# Patient Record
Sex: Female | Born: 1995 | Race: White | Hispanic: No | Marital: Single | Smoking: Former smoker
Health system: Southern US, Community
[De-identification: ages and names within clinical notes are randomized; demographics above are authoritative.]

## PROBLEM LIST (undated history)

## (undated) DIAGNOSIS — F4310 Post-traumatic stress disorder, unspecified: Secondary | ICD-10-CM

## (undated) DIAGNOSIS — Z8679 Personal history of other diseases of the circulatory system: Secondary | ICD-10-CM

## (undated) DIAGNOSIS — K3184 Gastroparesis: Secondary | ICD-10-CM

## (undated) DIAGNOSIS — K219 Gastro-esophageal reflux disease without esophagitis: Secondary | ICD-10-CM

## (undated) DIAGNOSIS — T7840XA Allergy, unspecified, initial encounter: Secondary | ICD-10-CM

## (undated) DIAGNOSIS — F32A Depression, unspecified: Secondary | ICD-10-CM

## (undated) DIAGNOSIS — M1990 Unspecified osteoarthritis, unspecified site: Secondary | ICD-10-CM

## (undated) DIAGNOSIS — D649 Anemia, unspecified: Secondary | ICD-10-CM

## (undated) DIAGNOSIS — F329 Major depressive disorder, single episode, unspecified: Secondary | ICD-10-CM

## (undated) DIAGNOSIS — F909 Attention-deficit hyperactivity disorder, unspecified type: Secondary | ICD-10-CM

## (undated) DIAGNOSIS — R112 Nausea with vomiting, unspecified: Secondary | ICD-10-CM

## (undated) DIAGNOSIS — Q7962 Hypermobile Ehlers-Danlos syndrome: Secondary | ICD-10-CM

## (undated) DIAGNOSIS — I499 Cardiac arrhythmia, unspecified: Secondary | ICD-10-CM

## (undated) DIAGNOSIS — Q796 Ehlers-Danlos syndrome, unspecified: Secondary | ICD-10-CM

## (undated) DIAGNOSIS — F419 Anxiety disorder, unspecified: Secondary | ICD-10-CM

## (undated) DIAGNOSIS — M797 Fibromyalgia: Secondary | ICD-10-CM

## (undated) DIAGNOSIS — Z9889 Other specified postprocedural states: Secondary | ICD-10-CM

## (undated) DIAGNOSIS — J40 Bronchitis, not specified as acute or chronic: Secondary | ICD-10-CM

## (undated) DIAGNOSIS — J45909 Unspecified asthma, uncomplicated: Secondary | ICD-10-CM

## (undated) DIAGNOSIS — G43909 Migraine, unspecified, not intractable, without status migrainosus: Secondary | ICD-10-CM

## (undated) DIAGNOSIS — L858 Other specified epidermal thickening: Secondary | ICD-10-CM

## (undated) HISTORY — PX: WISDOM TOOTH EXTRACTION: SHX21

## (undated) HISTORY — PX: JOINT REPLACEMENT: SHX530

## (undated) HISTORY — DX: Fibromyalgia: M79.7

## (undated) HISTORY — DX: Other specified epidermal thickening: L85.8

## (undated) HISTORY — PX: ESOPHAGOGASTRODUODENOSCOPY ENDOSCOPY: SHX5814

## (undated) HISTORY — PX: TYMPANOSTOMY TUBE PLACEMENT: SHX32

## (undated) HISTORY — DX: Ehlers-Danlos syndrome, unspecified: Q79.60

## (undated) HISTORY — DX: Migraine, unspecified, not intractable, without status migrainosus: G43.909

## (undated) HISTORY — DX: Allergy, unspecified, initial encounter: T78.40XA

## (undated) HISTORY — PX: SMART PILL PROCEDURE: SHX6496

## (undated) HISTORY — PX: HIP ARTHROPLASTY: SHX981

## (undated) HISTORY — DX: Unspecified osteoarthritis, unspecified site: M19.90

## (undated) HISTORY — DX: Gastroparesis: K31.84

## (undated) HISTORY — PX: HIP SURGERY: SHX245

---
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
## 2016-05-01 DIAGNOSIS — J3081 Allergic rhinitis due to animal (cat) (dog) hair and dander: Secondary | ICD-10-CM

## 2016-05-01 DIAGNOSIS — J312 Chronic pharyngitis: Secondary | ICD-10-CM | POA: Insufficient documentation

## 2016-05-01 DIAGNOSIS — L2381 Allergic contact dermatitis due to animal (cat) (dog) dander: Secondary | ICD-10-CM

## 2016-05-01 HISTORY — DX: Chronic pharyngitis: J31.2

## 2016-05-01 HISTORY — DX: Allergic contact dermatitis due to animal (cat) (dog) dander: L23.81

## 2016-05-01 HISTORY — DX: Allergic rhinitis due to animal (cat) (dog) hair and dander: J30.81

## 2016-10-31 ENCOUNTER — Emergency Department (HOSPITAL_COMMUNITY)
Admission: EM | Admit: 2016-10-31 | Discharge: 2016-10-31 | Disposition: A | Discharge status: Home / Self Care | Payer: 59 | Attending: Emergency Medicine | Admitting: Emergency Medicine

## 2016-10-31 ENCOUNTER — Encounter (HOSPITAL_COMMUNITY): Payer: Self-pay

## 2016-10-31 DIAGNOSIS — F329 Major depressive disorder, single episode, unspecified: Secondary | ICD-10-CM | POA: Diagnosis not present

## 2016-10-31 DIAGNOSIS — Z5181 Encounter for therapeutic drug level monitoring: Secondary | ICD-10-CM | POA: Diagnosis not present

## 2016-10-31 DIAGNOSIS — J45909 Unspecified asthma, uncomplicated: Secondary | ICD-10-CM | POA: Diagnosis not present

## 2016-10-31 DIAGNOSIS — Z87891 Personal history of nicotine dependence: Secondary | ICD-10-CM | POA: Diagnosis not present

## 2016-10-31 DIAGNOSIS — R45851 Suicidal ideations: Secondary | ICD-10-CM | POA: Diagnosis present

## 2016-10-31 HISTORY — DX: Major depressive disorder, single episode, unspecified: F32.9

## 2016-10-31 HISTORY — DX: Bronchitis, not specified as acute or chronic: J40

## 2016-10-31 HISTORY — DX: Depression, unspecified: F32.A

## 2016-10-31 HISTORY — DX: Unspecified asthma, uncomplicated: J45.909

## 2016-10-31 LAB — RAPID URINE DRUG SCREEN, HOSP PERFORMED
AMPHETAMINES: NOT DETECTED
BARBITURATES: NOT DETECTED
Benzodiazepines: NOT DETECTED
Cocaine: NOT DETECTED
OPIATES: NOT DETECTED
TETRAHYDROCANNABINOL: NOT DETECTED

## 2016-10-31 LAB — CBC
HEMATOCRIT: 38.8 % (ref 36.0–46.0)
HEMOGLOBIN: 13.1 g/dL (ref 12.0–15.0)
MCH: 29.3 pg (ref 26.0–34.0)
MCHC: 33.8 g/dL (ref 30.0–36.0)
MCV: 86.8 fL (ref 78.0–100.0)
Platelets: 282 10*3/uL (ref 150–400)
RBC: 4.47 MIL/uL (ref 3.87–5.11)
RDW: 12.2 % (ref 11.5–15.5)
WBC: 8.5 10*3/uL (ref 4.0–10.5)

## 2016-10-31 LAB — COMPREHENSIVE METABOLIC PANEL
ALBUMIN: 4.2 g/dL (ref 3.5–5.0)
ALK PHOS: 53 U/L (ref 38–126)
ALT: 18 U/L (ref 14–54)
AST: 19 U/L (ref 15–41)
Anion gap: 8 (ref 5–15)
BUN: 10 mg/dL (ref 6–20)
CALCIUM: 9.5 mg/dL (ref 8.9–10.3)
CHLORIDE: 106 mmol/L (ref 101–111)
CO2: 22 mmol/L (ref 22–32)
CREATININE: 0.7 mg/dL (ref 0.44–1.00)
GFR calc Af Amer: >60 mL/min (ref >60)
GFR calc non Af Amer: >60 mL/min (ref >60)
GLUCOSE: 87 mg/dL (ref 65–99)
Potassium: 3.7 mmol/L (ref 3.5–5.1)
SODIUM: 136 mmol/L (ref 135–145)
Total Bilirubin: 0.6 mg/dL (ref 0.3–1.2)
Total Protein: 7.3 g/dL (ref 6.5–8.1)

## 2016-10-31 LAB — SALICYLATE LEVEL: Salicylate Lvl: <7 mg/dL (ref 2.8–30.0)

## 2016-10-31 LAB — ETHANOL: Alcohol, Ethyl (B): <5 mg/dL (ref <5)

## 2016-10-31 LAB — ACETAMINOPHEN LEVEL: Acetaminophen (Tylenol), Serum: <10 ug/mL — ABNORMAL LOW (ref 10–30)

## 2016-10-31 LAB — PREGNANCY, URINE: Preg Test, Ur: NEGATIVE

## 2016-10-31 NOTE — ED Triage Notes (Signed)
Onset 2 nights ago pt had thoughts of suicide, had plan, was going to take new medication- Hydroxyzine, pt looked this med up online and found it could have bad interaction with another antihistamine she is on.  Pts friend called [facility], counselor came to see pt today, spent 3 hours with pt, who recommended pt go to ED as [facility] closed at 8pm.  Pt reports thoughts of hurting self today, no plan.  Pt is accompanied by friend.

## 2016-10-31 NOTE — ED Notes (Signed)
Dr. Su Leyeagler in pt room.  Will discharge home.

## 2016-10-31 NOTE — ED Provider Notes (Signed)
[facility]-EMERGENCY DEPT Provider Note   CSN: [id number] Arrival date & time: 10/31/16  2030     History   Chief Complaint Chief Complaint  Patient presents with  . Suicidal    HPI Brianna [name] is a 21 y.o. female.  The history is provided by the patient and a friend. No language interpreter was used.  Mental Health Problem  Presenting symptoms: suicidal thoughts (resolved)   Presenting symptoms: no aggressive behavior, no agitation, no delusions, no depression, no hallucinations, no homicidal ideas, no self-mutilation, no suicidal threats and no suicide attempt   Degree of incapacity (severity):  Moderate Onset quality:  Gradual Duration:  1 week Timing:  Rare Progression:  Resolved Chronicity:  New Context: not alcohol use and not noncompliant   Treatment compliance:  Untreated Relieved by:  Nothing Worsened by:  Nothing Ineffective treatments:  None tried Associated symptoms: no abdominal pain, no chest pain, no fatigue and no headaches   Risk factors: hx of mental illness (deopression)   Risk factors: no hx of suicide attempts     Past Medical History:  Diagnosis Date  . Asthma   . Bronchitis   . Depression     There are no active problems to display for this patient.   Past Surgical History:  Procedure Laterality Date  . WISDOM TOOTH EXTRACTION      OB History    No data available       Home Medications    Prior to Admission medications   Not on File    Family History History reviewed. No pertinent family history.  Social History Social History  Substance Use Topics  . Smoking status: Former Smoker    Types: E-cigarettes  . Smokeless tobacco: Never Used  . Alcohol use 12.0 oz/week    12 Cans of beer, 8 Glasses of wine per week     Allergies   Patient has no known allergies.   Review of Systems Review of Systems  Constitutional: Negative for activity change, chills, diaphoresis, fatigue and fever.  HENT: Negative for  congestion and rhinorrhea.   Eyes: Negative for visual disturbance.  Respiratory: Negative for cough, chest tightness, shortness of breath and stridor.   Cardiovascular: Negative for chest pain, palpitations and leg swelling.  Gastrointestinal: Negative for abdominal distention, abdominal pain, constipation, diarrhea, nausea and vomiting.  Genitourinary: Negative for difficulty urinating, dysuria, flank pain, frequency, hematuria, menstrual problem, pelvic pain, vaginal bleeding and vaginal discharge.  Musculoskeletal: Negative for back pain and neck pain.  Skin: Negative for rash and wound.  Neurological: Negative for dizziness, weakness, light-headedness, numbness and headaches.  Psychiatric/Behavioral: Positive for suicidal ideas (resolved). Negative for agitation, confusion, hallucinations, homicidal ideas and self-injury.  All other systems reviewed and are negative.    Physical Exam Updated Vital Signs BP 117/84 (BP Location: Left Arm)   Pulse 79   Temp 99.5 F (37.5 C) (Oral)   Resp 17   Ht 5\' 3"  (1.6 m)   Wt 63.5 kg (140 lb)   LMP 10/08/2016   SpO2 99%   BMI 24.80 kg/m   Physical Exam  Constitutional: She is oriented to person, place, and time. She appears well-developed and well-nourished. No distress.  HENT:  Head: Normocephalic and atraumatic.  Mouth/Throat: Oropharynx is clear and moist. No oropharyngeal exudate.  Eyes: Conjunctivae and EOM are normal. Pupils are equal, round, and reactive to light.  Neck: Normal range of motion. Neck supple.  Cardiovascular: Normal rate and regular rhythm.   No murmur heard. Pulmonary/Chest:  Effort normal and breath sounds normal. No stridor. No respiratory distress.  Abdominal: Soft. There is no tenderness.  Musculoskeletal: She exhibits no edema or tenderness.  Neurological: She is alert and oriented to person, place, and time. No sensory deficit. She exhibits normal muscle tone.  Skin: Skin is warm and dry. Capillary refill  takes less than 2 seconds. She is not diaphoretic. No erythema.  Psychiatric: She has a normal mood and affect. Her speech is not rapid and/or pressured. She is not agitated, not aggressive and not actively hallucinating. Thought content is not paranoid. She does not express impulsivity. She does not exhibit a depressed mood. She expresses no homicidal and no suicidal ideation. She expresses no suicidal plans and no homicidal plans.  Nursing note and vitals reviewed.    ED Treatments / Results  Labs (all labs ordered are listed, but only abnormal results are displayed) Labs Reviewed  ACETAMINOPHEN LEVEL - Abnormal; Notable for the following:       Result Value   Acetaminophen (Tylenol), Serum <10 (*)    All other components within normal limits  COMPREHENSIVE METABOLIC PANEL  ETHANOL  SALICYLATE LEVEL  CBC  RAPID URINE DRUG SCREEN, HOSP PERFORMED  PREGNANCY, URINE    EKG  EKG Interpretation None       Radiology No results found.  Procedures Procedures (including critical care time)  Medications Ordered in ED Medications - No data to display   Initial Impression / Assessment and Plan / ED Course  I have reviewed the triage vital signs and the nursing notes.  Pertinent labs & imaging results that were available during my care of the patient were reviewed by me and considered in my medical decision making (see chart for details).     Brianna [name] is a 21 y.o. female with a reported past medical history of depression who presents with suicidal ideation. Patient has come in by a friend who reports that several days ago, patient reported suicidal thoughts. Patient had a possible plan of overdose but says that she decided it would be too painful. She says that she was convinced by her therapist to come to the ED for evaluation when she found out that [facility] was closed tonight. Patient currently denies any suicidal ideation or homicidal ideation. She reports no worsened  depression and denies any other complaints. She denies audiovisual hallucinations.  Due to concern for SI, patient was evaluated by me in triage. Next  On exam, no focal neurologic deficits. Lungs clear. Abdomen nontender. Patient denies current suicidal ideation or plan of suicide. Patient reports that she is going to stay with her friend and her friend's parents.  Patient had screening laboratory testing according to mental health protocol. Laboratory testing was unremarkable.  Due to patient's current denial of suicidal ideation, lack of a plan, patient does not appear to be a threat to herself or others. Patient denies history of suicide attempt and reports that she is going to talk to her counselor tomorrow. Patient understood return precautions for any new or worsened symptoms. Patient's friend also voiced understanding of plan of care with close follow-up and strict return precautions. Shared decision-making conversation held and patient felt stable for discharge.  Patient discharged in good condition with understanding of suicide precautions.     Final Clinical Impressions(s) / ED Diagnoses   Final diagnoses:  Suicidal ideation    New Prescriptions There are no discharge medications for this patient.   Clinical Impression: 1. Suicidal ideation  Disposition: Discharge  Condition: Good  I have discussed the results, Dx and Tx plan with the pt(& family if present). He/she/they expressed understanding and agree(s) with the plan. Discharge instructions discussed at great length. Strict return precautions discussed and pt &/or family have verbalized understanding of the instructions. No further questions at time of discharge.    There are no discharge medications for this patient.   Follow Up: [facility] [address] [place] [address] [phone] Schedule an appointment as soon as possible for a visit    [facility] EMERGENCY DEPARTMENT [address] [id number] mc [place] [address] [phone]  If symptoms worsen  [facility] [place] [address] [phone]  Schedule an appointment as soon as possible for a visit       Yanni Ruberg, Canary Brim, MD 11/01/16 [phone]

## 2016-10-31 NOTE — ED Notes (Signed)
This nurse called to triage room #1, pt wanting to leave.  Pt states "I  Think there has been some kind of misunderstanding, I do not have any thoughts of hurting myself tonight".  Discussed with Dr. Su Leyeagler.

## 2016-10-31 NOTE — Discharge Instructions (Signed)
Please call to follow-up with the [facility] team. Please follow-up with your PCP and counselor. If any symptoms change or worsen, please return immediately to the nearest emergency department.

## 2018-05-22 DIAGNOSIS — U071 COVID-19: Secondary | ICD-10-CM

## 2018-05-22 HISTORY — DX: COVID-19: U07.1

## 2018-06-06 DIAGNOSIS — Z79899 Other long term (current) drug therapy: Secondary | ICD-10-CM | POA: Diagnosis not present

## 2018-06-06 DIAGNOSIS — L700 Acne vulgaris: Secondary | ICD-10-CM | POA: Diagnosis not present

## 2018-06-07 DIAGNOSIS — L700 Acne vulgaris: Secondary | ICD-10-CM | POA: Diagnosis not present

## 2018-06-18 DIAGNOSIS — Z79899 Other long term (current) drug therapy: Secondary | ICD-10-CM | POA: Diagnosis not present

## 2018-06-18 DIAGNOSIS — L700 Acne vulgaris: Secondary | ICD-10-CM | POA: Diagnosis not present

## 2018-07-15 DIAGNOSIS — L700 Acne vulgaris: Secondary | ICD-10-CM | POA: Diagnosis not present

## 2018-07-15 DIAGNOSIS — Z79899 Other long term (current) drug therapy: Secondary | ICD-10-CM | POA: Diagnosis not present

## 2018-07-18 DIAGNOSIS — L700 Acne vulgaris: Secondary | ICD-10-CM | POA: Diagnosis not present

## 2018-08-14 DIAGNOSIS — Z79899 Other long term (current) drug therapy: Secondary | ICD-10-CM | POA: Diagnosis not present

## 2018-08-14 DIAGNOSIS — L700 Acne vulgaris: Secondary | ICD-10-CM | POA: Diagnosis not present

## 2018-08-15 DIAGNOSIS — L700 Acne vulgaris: Secondary | ICD-10-CM | POA: Diagnosis not present

## 2018-09-11 DIAGNOSIS — L700 Acne vulgaris: Secondary | ICD-10-CM | POA: Diagnosis not present

## 2018-09-11 DIAGNOSIS — Z79899 Other long term (current) drug therapy: Secondary | ICD-10-CM | POA: Diagnosis not present

## 2018-09-12 DIAGNOSIS — L700 Acne vulgaris: Secondary | ICD-10-CM | POA: Diagnosis not present

## 2018-10-10 DIAGNOSIS — L700 Acne vulgaris: Secondary | ICD-10-CM | POA: Diagnosis not present

## 2018-10-11 DIAGNOSIS — L700 Acne vulgaris: Secondary | ICD-10-CM | POA: Diagnosis not present

## 2018-10-11 DIAGNOSIS — Z79899 Other long term (current) drug therapy: Secondary | ICD-10-CM | POA: Diagnosis not present

## 2018-11-06 DIAGNOSIS — L700 Acne vulgaris: Secondary | ICD-10-CM | POA: Diagnosis not present

## 2018-11-06 DIAGNOSIS — Z79899 Other long term (current) drug therapy: Secondary | ICD-10-CM | POA: Diagnosis not present

## 2018-11-07 DIAGNOSIS — L700 Acne vulgaris: Secondary | ICD-10-CM | POA: Diagnosis not present

## 2018-12-11 DIAGNOSIS — L700 Acne vulgaris: Secondary | ICD-10-CM | POA: Diagnosis not present

## 2018-12-11 DIAGNOSIS — Z79899 Other long term (current) drug therapy: Secondary | ICD-10-CM | POA: Diagnosis not present

## 2018-12-12 DIAGNOSIS — L700 Acne vulgaris: Secondary | ICD-10-CM | POA: Diagnosis not present

## 2019-01-02 DIAGNOSIS — U071 COVID-19: Secondary | ICD-10-CM | POA: Diagnosis not present

## 2019-01-02 DIAGNOSIS — Z03818 Encounter for observation for suspected exposure to other biological agents ruled out: Secondary | ICD-10-CM | POA: Diagnosis not present

## 2019-01-07 DIAGNOSIS — U071 COVID-19: Secondary | ICD-10-CM | POA: Diagnosis not present

## 2019-01-13 DIAGNOSIS — U071 COVID-19: Secondary | ICD-10-CM | POA: Diagnosis not present

## 2019-01-13 DIAGNOSIS — Z03818 Encounter for observation for suspected exposure to other biological agents ruled out: Secondary | ICD-10-CM | POA: Diagnosis not present

## 2019-01-15 DIAGNOSIS — U071 COVID-19: Secondary | ICD-10-CM | POA: Diagnosis not present

## 2019-02-05 DIAGNOSIS — F3342 Major depressive disorder, recurrent, in full remission: Secondary | ICD-10-CM | POA: Diagnosis not present

## 2019-02-13 DIAGNOSIS — Z79899 Other long term (current) drug therapy: Secondary | ICD-10-CM | POA: Diagnosis not present

## 2019-02-13 DIAGNOSIS — L700 Acne vulgaris: Secondary | ICD-10-CM | POA: Diagnosis not present

## 2019-02-17 DIAGNOSIS — Z79899 Other long term (current) drug therapy: Secondary | ICD-10-CM | POA: Diagnosis not present

## 2019-02-17 DIAGNOSIS — L700 Acne vulgaris: Secondary | ICD-10-CM | POA: Diagnosis not present

## 2019-02-26 DIAGNOSIS — Z01419 Encounter for gynecological examination (general) (routine) without abnormal findings: Secondary | ICD-10-CM | POA: Diagnosis not present

## 2019-02-26 DIAGNOSIS — Z113 Encounter for screening for infections with a predominantly sexual mode of transmission: Secondary | ICD-10-CM | POA: Diagnosis not present

## 2019-02-26 DIAGNOSIS — Z6824 Body mass index (BMI) 24.0-24.9, adult: Secondary | ICD-10-CM | POA: Diagnosis not present

## 2019-03-05 DIAGNOSIS — R112 Nausea with vomiting, unspecified: Secondary | ICD-10-CM | POA: Diagnosis not present

## 2019-03-05 DIAGNOSIS — K529 Noninfective gastroenteritis and colitis, unspecified: Secondary | ICD-10-CM | POA: Diagnosis not present

## 2019-03-17 DIAGNOSIS — Z13220 Encounter for screening for lipoid disorders: Secondary | ICD-10-CM | POA: Diagnosis not present

## 2019-03-17 DIAGNOSIS — Z23 Encounter for immunization: Secondary | ICD-10-CM | POA: Diagnosis not present

## 2019-03-17 DIAGNOSIS — Z0000 Encounter for general adult medical examination without abnormal findings: Secondary | ICD-10-CM | POA: Diagnosis not present

## 2019-03-17 DIAGNOSIS — R7989 Other specified abnormal findings of blood chemistry: Secondary | ICD-10-CM | POA: Diagnosis not present

## 2019-04-21 DIAGNOSIS — J029 Acute pharyngitis, unspecified: Secondary | ICD-10-CM | POA: Diagnosis not present

## 2019-04-21 DIAGNOSIS — J069 Acute upper respiratory infection, unspecified: Secondary | ICD-10-CM | POA: Diagnosis not present

## 2019-04-21 DIAGNOSIS — Z03818 Encounter for observation for suspected exposure to other biological agents ruled out: Secondary | ICD-10-CM | POA: Diagnosis not present

## 2019-04-22 DIAGNOSIS — J069 Acute upper respiratory infection, unspecified: Secondary | ICD-10-CM | POA: Diagnosis not present

## 2019-04-22 DIAGNOSIS — J029 Acute pharyngitis, unspecified: Secondary | ICD-10-CM | POA: Diagnosis not present

## 2019-05-08 DIAGNOSIS — Z20818 Contact with and (suspected) exposure to other bacterial communicable diseases: Secondary | ICD-10-CM | POA: Diagnosis not present

## 2019-05-08 DIAGNOSIS — U071 COVID-19: Secondary | ICD-10-CM | POA: Diagnosis not present

## 2019-06-30 DIAGNOSIS — R61 Generalized hyperhidrosis: Secondary | ICD-10-CM | POA: Diagnosis not present

## 2019-06-30 DIAGNOSIS — R197 Diarrhea, unspecified: Secondary | ICD-10-CM | POA: Diagnosis not present

## 2019-07-01 DIAGNOSIS — R197 Diarrhea, unspecified: Secondary | ICD-10-CM | POA: Diagnosis not present

## 2019-07-01 DIAGNOSIS — R61 Generalized hyperhidrosis: Secondary | ICD-10-CM | POA: Diagnosis not present

## 2019-07-24 DIAGNOSIS — Z3040 Encounter for surveillance of contraceptives, unspecified: Secondary | ICD-10-CM | POA: Diagnosis not present

## 2019-07-31 ENCOUNTER — Ambulatory Visit: Payer: 59 | Attending: Internal Medicine

## 2019-07-31 DIAGNOSIS — Z23 Encounter for immunization: Secondary | ICD-10-CM

## 2019-07-31 NOTE — Progress Notes (Signed)
   Covid-19 Vaccination Clinic  Name:  Roxan Yamamoto    MRN: [id number] DOB: [birth year]  07/31/2019  Ms. Imes was observed post Covid-19 immunization for 15 minutes without incident. She was provided with Vaccine Information Sheet and instruction to access the V-Safe system.   Ms. Tonkinson was instructed to call 911 with any severe reactions post vaccine: Marland Kitchen Difficulty breathing  . Swelling of face and throat  . A fast heartbeat  . A bad rash all over body  . Dizziness and weakness   Immunizations Administered    Name Date Dose VIS Date Route   Pfizer COVID-19 Vaccine 07/31/2019 11:10 AM 0.3 mL 05/02/2019 Intramuscular   Manufacturer: [employer], [employer]   Lot: [id number]   NDC: [address]-7

## 2019-08-25 ENCOUNTER — Ambulatory Visit: Payer: 59 | Attending: Internal Medicine

## 2019-08-25 DIAGNOSIS — Z23 Encounter for immunization: Secondary | ICD-10-CM

## 2019-08-25 NOTE — Progress Notes (Signed)
   Covid-19 Vaccination Clinic  Name:  Brianna [name]    MRN: [id number] DOB: [birth year]  08/25/2019  Brianna [name] was observed post Covid-19 immunization for 15 minutes without incident. She was provided with Vaccine Information Sheet and instruction to access the V-Safe system.   Brianna [name] was instructed to call 911 with any severe reactions post vaccine: Marland Kitchen Difficulty breathing  . Swelling of face and throat  . A fast heartbeat  . A bad rash all over body  . Dizziness and weakness   Immunizations Administered    Name Date Dose VIS Date Route   Pfizer COVID-19 Vaccine 08/25/2019 10:14 AM 0.3 mL 05/02/2019 Intramuscular   Manufacturer: [employer], [employer]   Lot: [id number]   NDC: [address]-1

## 2019-10-03 DIAGNOSIS — R05 Cough: Secondary | ICD-10-CM | POA: Diagnosis not present

## 2019-10-03 DIAGNOSIS — J029 Acute pharyngitis, unspecified: Secondary | ICD-10-CM | POA: Diagnosis not present

## 2019-10-03 DIAGNOSIS — R519 Headache, unspecified: Secondary | ICD-10-CM | POA: Diagnosis not present

## 2019-10-03 DIAGNOSIS — Z03818 Encounter for observation for suspected exposure to other biological agents ruled out: Secondary | ICD-10-CM | POA: Diagnosis not present

## 2019-10-09 DIAGNOSIS — J00 Acute nasopharyngitis [common cold]: Secondary | ICD-10-CM | POA: Diagnosis not present

## 2019-10-14 DIAGNOSIS — H66002 Acute suppurative otitis media without spontaneous rupture of ear drum, left ear: Secondary | ICD-10-CM | POA: Diagnosis not present

## 2019-10-15 DIAGNOSIS — Z3009 Encounter for other general counseling and advice on contraception: Secondary | ICD-10-CM | POA: Diagnosis not present

## 2019-10-15 DIAGNOSIS — Z6824 Body mass index (BMI) 24.0-24.9, adult: Secondary | ICD-10-CM | POA: Diagnosis not present

## 2019-10-16 DIAGNOSIS — H6506 Acute serous otitis media, recurrent, bilateral: Secondary | ICD-10-CM | POA: Diagnosis not present

## 2019-10-16 DIAGNOSIS — H9202 Otalgia, left ear: Secondary | ICD-10-CM | POA: Diagnosis not present

## 2019-10-21 DIAGNOSIS — J302 Other seasonal allergic rhinitis: Secondary | ICD-10-CM | POA: Diagnosis not present

## 2019-10-21 DIAGNOSIS — J0141 Acute recurrent pansinusitis: Secondary | ICD-10-CM | POA: Insufficient documentation

## 2019-10-21 DIAGNOSIS — H6983 Other specified disorders of Eustachian tube, bilateral: Secondary | ICD-10-CM | POA: Insufficient documentation

## 2019-10-21 HISTORY — DX: Acute recurrent pansinusitis: J01.41

## 2019-11-20 DIAGNOSIS — S99912A Unspecified injury of left ankle, initial encounter: Secondary | ICD-10-CM | POA: Diagnosis not present

## 2019-11-20 DIAGNOSIS — S93402A Sprain of unspecified ligament of left ankle, initial encounter: Secondary | ICD-10-CM | POA: Diagnosis not present

## 2020-01-02 DIAGNOSIS — F4323 Adjustment disorder with mixed anxiety and depressed mood: Secondary | ICD-10-CM | POA: Diagnosis not present

## 2020-01-05 DIAGNOSIS — F4323 Adjustment disorder with mixed anxiety and depressed mood: Secondary | ICD-10-CM | POA: Diagnosis not present

## 2020-01-07 DIAGNOSIS — K219 Gastro-esophageal reflux disease without esophagitis: Secondary | ICD-10-CM | POA: Diagnosis not present

## 2020-01-12 DIAGNOSIS — F4323 Adjustment disorder with mixed anxiety and depressed mood: Secondary | ICD-10-CM | POA: Diagnosis not present

## 2020-01-13 DIAGNOSIS — F411 Generalized anxiety disorder: Secondary | ICD-10-CM | POA: Diagnosis not present

## 2020-01-20 DIAGNOSIS — F411 Generalized anxiety disorder: Secondary | ICD-10-CM | POA: Diagnosis not present

## 2020-01-27 DIAGNOSIS — F411 Generalized anxiety disorder: Secondary | ICD-10-CM | POA: Diagnosis not present

## 2020-02-03 DIAGNOSIS — F411 Generalized anxiety disorder: Secondary | ICD-10-CM | POA: Diagnosis not present

## 2020-02-10 DIAGNOSIS — F411 Generalized anxiety disorder: Secondary | ICD-10-CM | POA: Diagnosis not present

## 2020-02-12 DIAGNOSIS — R195 Other fecal abnormalities: Secondary | ICD-10-CM | POA: Diagnosis not present

## 2020-02-12 DIAGNOSIS — R110 Nausea: Secondary | ICD-10-CM | POA: Diagnosis not present

## 2020-02-12 DIAGNOSIS — R1084 Generalized abdominal pain: Secondary | ICD-10-CM | POA: Diagnosis not present

## 2020-02-12 DIAGNOSIS — R140 Abdominal distension (gaseous): Secondary | ICD-10-CM | POA: Diagnosis not present

## 2020-02-12 DIAGNOSIS — K921 Melena: Secondary | ICD-10-CM | POA: Diagnosis not present

## 2020-02-17 DIAGNOSIS — F411 Generalized anxiety disorder: Secondary | ICD-10-CM | POA: Diagnosis not present

## 2020-02-20 DIAGNOSIS — N819 Female genital prolapse, unspecified: Secondary | ICD-10-CM | POA: Insufficient documentation

## 2020-02-24 DIAGNOSIS — F411 Generalized anxiety disorder: Secondary | ICD-10-CM | POA: Diagnosis not present

## 2020-03-03 DIAGNOSIS — K921 Melena: Secondary | ICD-10-CM | POA: Diagnosis not present

## 2020-03-03 DIAGNOSIS — K3184 Gastroparesis: Secondary | ICD-10-CM | POA: Insufficient documentation

## 2020-03-09 DIAGNOSIS — F411 Generalized anxiety disorder: Secondary | ICD-10-CM | POA: Diagnosis not present

## 2020-03-16 DIAGNOSIS — F411 Generalized anxiety disorder: Secondary | ICD-10-CM | POA: Diagnosis not present

## 2020-03-19 DIAGNOSIS — R112 Nausea with vomiting, unspecified: Secondary | ICD-10-CM | POA: Diagnosis not present

## 2020-03-19 DIAGNOSIS — R197 Diarrhea, unspecified: Secondary | ICD-10-CM | POA: Diagnosis not present

## 2020-03-23 DIAGNOSIS — F411 Generalized anxiety disorder: Secondary | ICD-10-CM | POA: Diagnosis not present

## 2020-03-25 DIAGNOSIS — R197 Diarrhea, unspecified: Secondary | ICD-10-CM | POA: Diagnosis not present

## 2020-03-26 ENCOUNTER — Other Ambulatory Visit: Payer: Self-pay | Admitting: Gastroenterology

## 2020-03-26 DIAGNOSIS — R1084 Generalized abdominal pain: Secondary | ICD-10-CM

## 2020-03-30 DIAGNOSIS — F411 Generalized anxiety disorder: Secondary | ICD-10-CM | POA: Diagnosis not present

## 2020-03-30 DIAGNOSIS — F410 Panic disorder [episodic paroxysmal anxiety] without agoraphobia: Secondary | ICD-10-CM | POA: Diagnosis not present

## 2020-03-30 DIAGNOSIS — F900 Attention-deficit hyperactivity disorder, predominantly inattentive type: Secondary | ICD-10-CM | POA: Diagnosis not present

## 2020-03-30 DIAGNOSIS — F331 Major depressive disorder, recurrent, moderate: Secondary | ICD-10-CM | POA: Diagnosis not present

## 2020-04-01 DIAGNOSIS — L700 Acne vulgaris: Secondary | ICD-10-CM | POA: Diagnosis not present

## 2020-04-01 DIAGNOSIS — Z1159 Encounter for screening for other viral diseases: Secondary | ICD-10-CM | POA: Diagnosis not present

## 2020-04-05 DIAGNOSIS — Z01419 Encounter for gynecological examination (general) (routine) without abnormal findings: Secondary | ICD-10-CM | POA: Diagnosis not present

## 2020-04-05 DIAGNOSIS — K644 Residual hemorrhoidal skin tags: Secondary | ICD-10-CM | POA: Diagnosis not present

## 2020-04-05 DIAGNOSIS — Z6820 Body mass index (BMI) 20.0-20.9, adult: Secondary | ICD-10-CM | POA: Diagnosis not present

## 2020-04-05 DIAGNOSIS — R194 Change in bowel habit: Secondary | ICD-10-CM | POA: Diagnosis not present

## 2020-04-05 DIAGNOSIS — K6289 Other specified diseases of anus and rectum: Secondary | ICD-10-CM | POA: Diagnosis not present

## 2020-04-05 DIAGNOSIS — Z113 Encounter for screening for infections with a predominantly sexual mode of transmission: Secondary | ICD-10-CM | POA: Diagnosis not present

## 2020-04-05 DIAGNOSIS — F411 Generalized anxiety disorder: Secondary | ICD-10-CM | POA: Diagnosis not present

## 2020-04-05 DIAGNOSIS — K625 Hemorrhage of anus and rectum: Secondary | ICD-10-CM | POA: Diagnosis not present

## 2020-04-06 DIAGNOSIS — R1013 Epigastric pain: Secondary | ICD-10-CM | POA: Diagnosis not present

## 2020-04-06 DIAGNOSIS — R1010 Upper abdominal pain, unspecified: Secondary | ICD-10-CM | POA: Diagnosis not present

## 2020-04-06 DIAGNOSIS — K2930 Chronic superficial gastritis without bleeding: Secondary | ICD-10-CM | POA: Diagnosis not present

## 2020-04-06 DIAGNOSIS — Z113 Encounter for screening for infections with a predominantly sexual mode of transmission: Secondary | ICD-10-CM | POA: Diagnosis not present

## 2020-04-06 DIAGNOSIS — K3189 Other diseases of stomach and duodenum: Secondary | ICD-10-CM | POA: Diagnosis not present

## 2020-04-06 DIAGNOSIS — R140 Abdominal distension (gaseous): Secondary | ICD-10-CM | POA: Diagnosis not present

## 2020-04-12 DIAGNOSIS — R1084 Generalized abdominal pain: Secondary | ICD-10-CM | POA: Diagnosis not present

## 2020-04-12 DIAGNOSIS — F411 Generalized anxiety disorder: Secondary | ICD-10-CM | POA: Diagnosis not present

## 2020-04-13 ENCOUNTER — Ambulatory Visit
Admission: RE | Admit: 2020-04-13 | Discharge: 2020-04-13 | Disposition: A | Discharge status: Home / Self Care | Payer: 59 | Source: Ambulatory Visit | Attending: Gastroenterology | Admitting: Gastroenterology

## 2020-04-13 DIAGNOSIS — R1084 Generalized abdominal pain: Secondary | ICD-10-CM

## 2020-04-14 ENCOUNTER — Other Ambulatory Visit: Payer: Self-pay

## 2020-04-14 ENCOUNTER — Other Ambulatory Visit: Payer: Self-pay | Admitting: Gastroenterology

## 2020-04-14 ENCOUNTER — Ambulatory Visit
Admission: RE | Admit: 2020-04-14 | Discharge: 2020-04-14 | Disposition: A | Discharge status: Home / Self Care | Payer: BC Managed Care – PPO | Source: Ambulatory Visit | Attending: Gastroenterology | Admitting: Gastroenterology

## 2020-04-14 DIAGNOSIS — F331 Major depressive disorder, recurrent, moderate: Secondary | ICD-10-CM | POA: Diagnosis not present

## 2020-04-14 DIAGNOSIS — F900 Attention-deficit hyperactivity disorder, predominantly inattentive type: Secondary | ICD-10-CM | POA: Diagnosis not present

## 2020-04-14 DIAGNOSIS — R1084 Generalized abdominal pain: Secondary | ICD-10-CM

## 2020-04-14 DIAGNOSIS — F411 Generalized anxiety disorder: Secondary | ICD-10-CM | POA: Diagnosis not present

## 2020-04-14 DIAGNOSIS — F410 Panic disorder [episodic paroxysmal anxiety] without agoraphobia: Secondary | ICD-10-CM | POA: Diagnosis not present

## 2020-04-19 DIAGNOSIS — F411 Generalized anxiety disorder: Secondary | ICD-10-CM | POA: Diagnosis not present

## 2020-04-20 DIAGNOSIS — F900 Attention-deficit hyperactivity disorder, predominantly inattentive type: Secondary | ICD-10-CM | POA: Diagnosis not present

## 2020-04-20 DIAGNOSIS — F331 Major depressive disorder, recurrent, moderate: Secondary | ICD-10-CM | POA: Diagnosis not present

## 2020-04-20 DIAGNOSIS — F411 Generalized anxiety disorder: Secondary | ICD-10-CM | POA: Diagnosis not present

## 2020-04-20 DIAGNOSIS — F410 Panic disorder [episodic paroxysmal anxiety] without agoraphobia: Secondary | ICD-10-CM | POA: Diagnosis not present

## 2020-04-20 DIAGNOSIS — R1084 Generalized abdominal pain: Secondary | ICD-10-CM | POA: Diagnosis not present

## 2020-04-20 DIAGNOSIS — R634 Abnormal weight loss: Secondary | ICD-10-CM | POA: Diagnosis not present

## 2020-04-20 DIAGNOSIS — K623 Rectal prolapse: Secondary | ICD-10-CM | POA: Diagnosis not present

## 2020-04-20 DIAGNOSIS — R140 Abdominal distension (gaseous): Secondary | ICD-10-CM | POA: Diagnosis not present

## 2020-04-21 DIAGNOSIS — R634 Abnormal weight loss: Secondary | ICD-10-CM | POA: Diagnosis not present

## 2020-04-26 DIAGNOSIS — K921 Melena: Secondary | ICD-10-CM | POA: Diagnosis not present

## 2020-04-26 DIAGNOSIS — K644 Residual hemorrhoidal skin tags: Secondary | ICD-10-CM | POA: Diagnosis not present

## 2020-04-26 DIAGNOSIS — K6289 Other specified diseases of anus and rectum: Secondary | ICD-10-CM | POA: Diagnosis not present

## 2020-04-28 DIAGNOSIS — K6389 Other specified diseases of intestine: Secondary | ICD-10-CM | POA: Diagnosis not present

## 2020-04-28 DIAGNOSIS — R140 Abdominal distension (gaseous): Secondary | ICD-10-CM | POA: Diagnosis not present

## 2020-04-28 DIAGNOSIS — R634 Abnormal weight loss: Secondary | ICD-10-CM | POA: Diagnosis not present

## 2020-04-28 DIAGNOSIS — K5900 Constipation, unspecified: Secondary | ICD-10-CM | POA: Diagnosis not present

## 2020-04-28 DIAGNOSIS — R1084 Generalized abdominal pain: Secondary | ICD-10-CM | POA: Diagnosis not present

## 2020-04-29 DIAGNOSIS — K644 Residual hemorrhoidal skin tags: Secondary | ICD-10-CM | POA: Diagnosis not present

## 2020-04-29 DIAGNOSIS — K648 Other hemorrhoids: Secondary | ICD-10-CM | POA: Insufficient documentation

## 2020-04-29 DIAGNOSIS — M6289 Other specified disorders of muscle: Secondary | ICD-10-CM | POA: Diagnosis not present

## 2020-05-04 DIAGNOSIS — R1030 Lower abdominal pain, unspecified: Secondary | ICD-10-CM | POA: Diagnosis not present

## 2020-05-04 DIAGNOSIS — E611 Iron deficiency: Secondary | ICD-10-CM | POA: Diagnosis not present

## 2020-05-04 DIAGNOSIS — F419 Anxiety disorder, unspecified: Secondary | ICD-10-CM | POA: Diagnosis not present

## 2020-05-12 DIAGNOSIS — R0981 Nasal congestion: Secondary | ICD-10-CM | POA: Diagnosis not present

## 2020-05-12 DIAGNOSIS — R52 Pain, unspecified: Secondary | ICD-10-CM | POA: Diagnosis not present

## 2020-05-12 DIAGNOSIS — R519 Headache, unspecified: Secondary | ICD-10-CM | POA: Diagnosis not present

## 2020-05-12 DIAGNOSIS — Z20822 Contact with and (suspected) exposure to covid-19: Secondary | ICD-10-CM | POA: Diagnosis not present

## 2020-05-12 DIAGNOSIS — R059 Cough, unspecified: Secondary | ICD-10-CM | POA: Diagnosis not present

## 2020-05-12 DIAGNOSIS — J029 Acute pharyngitis, unspecified: Secondary | ICD-10-CM | POA: Diagnosis not present

## 2020-05-17 DIAGNOSIS — J069 Acute upper respiratory infection, unspecified: Secondary | ICD-10-CM | POA: Diagnosis not present

## 2020-05-17 DIAGNOSIS — M6289 Other specified disorders of muscle: Secondary | ICD-10-CM | POA: Diagnosis not present

## 2020-05-17 DIAGNOSIS — Z20822 Contact with and (suspected) exposure to covid-19: Secondary | ICD-10-CM | POA: Diagnosis not present

## 2020-05-17 DIAGNOSIS — K5909 Other constipation: Secondary | ICD-10-CM | POA: Diagnosis not present

## 2020-05-17 DIAGNOSIS — R102 Pelvic and perineal pain: Secondary | ICD-10-CM | POA: Diagnosis not present

## 2020-05-18 DIAGNOSIS — F411 Generalized anxiety disorder: Secondary | ICD-10-CM | POA: Diagnosis not present

## 2020-05-18 DIAGNOSIS — F331 Major depressive disorder, recurrent, moderate: Secondary | ICD-10-CM | POA: Diagnosis not present

## 2020-05-18 DIAGNOSIS — F900 Attention-deficit hyperactivity disorder, predominantly inattentive type: Secondary | ICD-10-CM | POA: Diagnosis not present

## 2020-05-18 DIAGNOSIS — F410 Panic disorder [episodic paroxysmal anxiety] without agoraphobia: Secondary | ICD-10-CM | POA: Diagnosis not present

## 2020-05-27 DIAGNOSIS — N764 Abscess of vulva: Secondary | ICD-10-CM | POA: Diagnosis not present

## 2020-05-28 DIAGNOSIS — K921 Melena: Secondary | ICD-10-CM | POA: Diagnosis not present

## 2020-05-28 DIAGNOSIS — K644 Residual hemorrhoidal skin tags: Secondary | ICD-10-CM | POA: Diagnosis not present

## 2020-05-31 DIAGNOSIS — R7989 Other specified abnormal findings of blood chemistry: Secondary | ICD-10-CM | POA: Diagnosis not present

## 2020-05-31 DIAGNOSIS — R102 Pelvic and perineal pain: Secondary | ICD-10-CM | POA: Diagnosis not present

## 2020-05-31 DIAGNOSIS — M6289 Other specified disorders of muscle: Secondary | ICD-10-CM | POA: Diagnosis not present

## 2020-05-31 DIAGNOSIS — K5909 Other constipation: Secondary | ICD-10-CM | POA: Diagnosis not present

## 2020-06-14 DIAGNOSIS — R002 Palpitations: Secondary | ICD-10-CM | POA: Insufficient documentation

## 2020-06-14 DIAGNOSIS — N926 Irregular menstruation, unspecified: Secondary | ICD-10-CM | POA: Diagnosis not present

## 2020-06-14 DIAGNOSIS — R109 Unspecified abdominal pain: Secondary | ICD-10-CM | POA: Diagnosis not present

## 2020-06-14 DIAGNOSIS — R102 Pelvic and perineal pain: Secondary | ICD-10-CM | POA: Diagnosis not present

## 2020-06-14 DIAGNOSIS — R195 Other fecal abnormalities: Secondary | ICD-10-CM | POA: Diagnosis not present

## 2020-06-15 DIAGNOSIS — F900 Attention-deficit hyperactivity disorder, predominantly inattentive type: Secondary | ICD-10-CM | POA: Diagnosis not present

## 2020-06-15 DIAGNOSIS — F411 Generalized anxiety disorder: Secondary | ICD-10-CM | POA: Diagnosis not present

## 2020-06-15 DIAGNOSIS — F410 Panic disorder [episodic paroxysmal anxiety] without agoraphobia: Secondary | ICD-10-CM | POA: Diagnosis not present

## 2020-06-15 DIAGNOSIS — F331 Major depressive disorder, recurrent, moderate: Secondary | ICD-10-CM | POA: Diagnosis not present

## 2020-06-21 ENCOUNTER — Other Ambulatory Visit: Payer: Self-pay | Admitting: Obstetrics

## 2020-06-21 DIAGNOSIS — F411 Generalized anxiety disorder: Secondary | ICD-10-CM | POA: Diagnosis not present

## 2020-06-21 DIAGNOSIS — R102 Pelvic and perineal pain: Secondary | ICD-10-CM

## 2020-06-22 DIAGNOSIS — R0789 Other chest pain: Secondary | ICD-10-CM | POA: Insufficient documentation

## 2020-06-22 DIAGNOSIS — K5903 Drug induced constipation: Secondary | ICD-10-CM | POA: Diagnosis not present

## 2020-06-22 DIAGNOSIS — R1900 Intra-abdominal and pelvic swelling, mass and lump, unspecified site: Secondary | ICD-10-CM | POA: Diagnosis not present

## 2020-06-22 DIAGNOSIS — R0602 Shortness of breath: Secondary | ICD-10-CM | POA: Insufficient documentation

## 2020-06-22 DIAGNOSIS — R42 Dizziness and giddiness: Secondary | ICD-10-CM | POA: Insufficient documentation

## 2020-06-22 DIAGNOSIS — R002 Palpitations: Secondary | ICD-10-CM | POA: Diagnosis not present

## 2020-06-23 ENCOUNTER — Ambulatory Visit
Admission: RE | Admit: 2020-06-23 | Discharge: 2020-06-23 | Disposition: A | Discharge status: Home / Self Care | Payer: BC Managed Care – PPO | Source: Ambulatory Visit | Attending: Obstetrics | Admitting: Obstetrics

## 2020-06-23 DIAGNOSIS — R002 Palpitations: Secondary | ICD-10-CM | POA: Diagnosis not present

## 2020-06-23 DIAGNOSIS — R102 Pelvic and perineal pain: Secondary | ICD-10-CM

## 2020-06-23 DIAGNOSIS — R42 Dizziness and giddiness: Secondary | ICD-10-CM | POA: Diagnosis not present

## 2020-06-23 DIAGNOSIS — R0602 Shortness of breath: Secondary | ICD-10-CM | POA: Diagnosis not present

## 2020-06-23 DIAGNOSIS — R0789 Other chest pain: Secondary | ICD-10-CM | POA: Diagnosis not present

## 2020-06-25 DIAGNOSIS — K582 Mixed irritable bowel syndrome: Secondary | ICD-10-CM | POA: Diagnosis not present

## 2020-06-25 DIAGNOSIS — R1030 Lower abdominal pain, unspecified: Secondary | ICD-10-CM | POA: Diagnosis not present

## 2020-06-25 DIAGNOSIS — R634 Abnormal weight loss: Secondary | ICD-10-CM | POA: Diagnosis not present

## 2020-06-25 DIAGNOSIS — R197 Diarrhea, unspecified: Secondary | ICD-10-CM | POA: Diagnosis not present

## 2020-06-28 ENCOUNTER — Ambulatory Visit (INDEPENDENT_AMBULATORY_CARE_PROVIDER_SITE_OTHER): Payer: BC Managed Care – PPO | Admitting: Nurse Practitioner

## 2020-06-28 VITALS — BP 111/69 | HR 89 | Temp 97.5°F | Ht 66.0 in | Wt 117.0 lb

## 2020-06-28 DIAGNOSIS — F332 Major depressive disorder, recurrent severe without psychotic features: Secondary | ICD-10-CM | POA: Insufficient documentation

## 2020-06-28 DIAGNOSIS — F3342 Major depressive disorder, recurrent, in full remission: Secondary | ICD-10-CM | POA: Insufficient documentation

## 2020-06-28 DIAGNOSIS — Z8616 Personal history of COVID-19: Secondary | ICD-10-CM | POA: Diagnosis not present

## 2020-06-28 DIAGNOSIS — R002 Palpitations: Secondary | ICD-10-CM | POA: Insufficient documentation

## 2020-06-28 DIAGNOSIS — U099 Post covid-19 condition, unspecified: Secondary | ICD-10-CM | POA: Insufficient documentation

## 2020-06-28 DIAGNOSIS — R000 Tachycardia, unspecified: Secondary | ICD-10-CM | POA: Diagnosis not present

## 2020-06-28 DIAGNOSIS — M2550 Pain in unspecified joint: Secondary | ICD-10-CM | POA: Diagnosis not present

## 2020-06-28 DIAGNOSIS — R41840 Attention and concentration deficit: Secondary | ICD-10-CM

## 2020-06-28 DIAGNOSIS — K219 Gastro-esophageal reflux disease without esophagitis: Secondary | ICD-10-CM | POA: Insufficient documentation

## 2020-06-28 NOTE — Patient Instructions (Addendum)
History of COVID - 2020 Joint pain:  Will check labs for inflammatory markers  Will place referral to PMR   Brain fog:  Will place referral to speech therapy for cognitive rehabilitation  GI issues:  Please keep follow up appointments with GI  Palpitations Chest pain:  Please keep follow up appointments with Cardiology  Will place referral to EP - evaluate for POTS  Depression:  Please keep follow up appointment with psychiatry    Follow up:  Follow up in 3 months or sooner if needed

## 2020-06-28 NOTE — Progress Notes (Signed)
@Patient  ID: , female    DOB: Mar 22, 1996, 25 y.o.   MRN: [id number]  Chief Complaint  Patient presents with  . New Patient (Initial Visit)    +2020, Sx: SOB, brain fog, chest pain,tightness (already in with Cardiology), sleep issues, night sweats, body pain. Stomach issues (in with GI) , dizzy spells.     Referring provider: [id number]., MD  25 year old female with history of allergic rhinitis, GERD, depression, anxiety  HPI  Patient presents today for post COVID care clinic visit.  Patient was diagnosed with Covid in August 2020.  She states that since that time she has been experiencing shortness of breath, brain fog, chest pain and tightness, sleep disturbance, night sweats, body pain, GI issues, dizzy spells.  Patient is currently seeing cardiology and GI.  Recent CTA showed that her lungs were clear.  We discussed that considering ongoing symptoms we can place referrals for speech therapy to help with cognitive rehabilitation, EP specialist to evaluate for POTS.  Patient also complains of multiple joint pain.  We will do lab work including inflammatory markers and refer patient to PMR.  Patient did have recent CRP that was normal.  She also had recent thyroid work-up that was normal. Denies f/c/s, n/v/d, hemoptysis, PND, chest pain or edema.     Allergies  Allergen Reactions  . Clindamycin Hives and Rash    Other reaction(s): swelling    Immunization History  Administered Date(s) Administered  . HPV Quadrivalent 09/04/2012  . Influenza Split 02/20/2020  . Influenza,inj,Quad PF,6+ Mos 05/02/2015, 03/17/2019, 03/07/2020  . PFIZER(Purple Top)SARS-COV-2 Vaccination 07/31/2019, 08/25/2019, 02/20/2020, 03/07/2020  . Tdap 12/10/2014, 03/17/2019    Past Medical History:  Diagnosis Date  . Asthma   . Bronchitis   . Depression     Tobacco History: Social History   Tobacco Use  Smoking Status Former Smoker  . Types: E-cigarettes  Smokeless Tobacco  Never Used   Counseling given: Not Answered   Outpatient Encounter Medications as of 06/28/2020  Medication Sig  . clonazePAM (KLONOPIN) 0.5 MG tablet Take by mouth.  . dicyclomine (BENTYL) 10 MG capsule Take by mouth.  . Ferrous Sulfate Dried (EQ SLOW-RELEASE IRON) 45 MG TBCR Take 1 tablet by mouth every other day.  . fluticasone (FLONASE) 50 MCG/ACT nasal spray Flonase 50 mcg/actuation nasal spray,suspension  Spray 1 spray every day by intranasal route.  . loratadine (CLARITIN) 10 MG tablet loratadine 10 mg tablet  . Multiple Vitamin (MULTIVITAMIN) tablet Take 1 tablet by mouth daily.   No facility-administered encounter medications on file as of 06/28/2020.     Review of Systems  Review of Systems  Constitutional: Negative.  Negative for fatigue and fever.  HENT: Negative.   Respiratory: Positive for shortness of breath. Negative for cough.   Cardiovascular: Positive for palpitations.  Gastrointestinal: Negative.   Musculoskeletal: Positive for arthralgias and myalgias.  Allergic/Immunologic: Negative.   Neurological: Positive for dizziness.  Psychiatric/Behavioral: Positive for decreased concentration and sleep disturbance.       Physical Exam  BP 111/69 (BP Location: Left Arm)   Pulse 89   Temp (!) 97.5 F (36.4 C)   Ht 5\' 6"  (1.676 m)   Wt 117 lb (53.1 kg)   LMP 06/25/2020   SpO2 100%   BMI 18.88 kg/m   Wt Readings from Last 5 Encounters:  06/28/20 117 lb (53.1 kg)  10/31/16 140 lb (63.5 kg) (69 %, Z= 0.50)*   * Growth percentiles are based on CDC (Girls, 2-20  Years) data.     Physical Exam Vitals and nursing note reviewed.  Constitutional:      General: She is not in acute distress.    Appearance: She is well-developed and well-nourished.  Cardiovascular:     Rate and Rhythm: Normal rate and regular rhythm.  Pulmonary:     Effort: Pulmonary effort is normal.     Breath sounds: Normal breath sounds.  Musculoskeletal:     Right lower leg: No edema.      Left lower leg: No edema.  Neurological:     Mental Status: She is alert and oriented to person, place, and time.  Psychiatric:        Mood and Affect: Mood and affect and mood normal.        Behavior: Behavior normal.       Imaging: US PELVIC COMPLETE WITH TRANSVAGINAL  Result Date: 06/23/2020 CLINICAL DATA:  Pelvic pain EXAM: TRANSABDOMINAL AND TRANSVAGINAL ULTRASOUND OF PELVIS TECHNIQUE: Both transabdominal and transvaginal ultrasound examinations of the pelvis were performed. Transabdominal technique was performed for global imaging of the pelvis including uterus, ovaries, adnexal regions, and pelvic cul-de-sac. It was necessary to proceed with endovaginal exam following the transabdominal exam to visualize the ovaries. COMPARISON:  CT dated April 14, 2020 FINDINGS: Uterus Measurements: 8.6 x 3.5 x 4.7 cm = volume: 74 mL. No fibroids or other mass visualized. Endometrium Thickness: 8 mm.  No focal abnormality visualized. Right ovary Measurements: 3.9 x 2.6 x 2.5 cm = volume: 13 mL. Normal appearance/no adnexal mass. Left ovary Measurements: 3.9 x 1.8 x 1.8 cm = volume: 6.5 mL. Normal appearance/no adnexal mass. Other findings No abnormal free fluid. IMPRESSION: Unremarkable pelvic ultrasound. Electronically Signed   By: Katherine Mantle M.D.   On: 06/23/2020 14:59     Assessment & Plan:   History of COVID-19 Joint pain:  Will check labs for inflammatory markers  Will place referral to PMR   Brain fog:  Will place referral to speech therapy for cognitive rehabilitation  GI issues:  Please keep follow up appointments with GI  Palpitations Chest pain:  Please keep follow up appointments with Cardiology  Will place referral to EP - evaluate for POTS  Depression:  Please keep follow up appointment with psychiatry    Follow up:  Follow up in 3 months or sooner if needed      Ivonne Andrew, NP 06/28/2020

## 2020-06-28 NOTE — Assessment & Plan Note (Signed)
Joint pain:  Will check labs for inflammatory markers  Will place referral to PMR   Brain fog:  Will place referral to speech therapy for cognitive rehabilitation  GI issues:  Please keep follow up appointments with GI  Palpitations Chest pain:  Please keep follow up appointments with Cardiology  Will place referral to EP - evaluate for POTS  Depression:  Please keep follow up appointment with psychiatry    Follow up:  Follow up in 3 months or sooner if needed

## 2020-06-29 DIAGNOSIS — F411 Generalized anxiety disorder: Secondary | ICD-10-CM | POA: Diagnosis not present

## 2020-06-29 LAB — SEDIMENTATION RATE: Sed Rate: 3 mm/hr (ref 0–32)

## 2020-06-29 LAB — RHEUMATOID FACTOR: Rhuematoid fact SerPl-aCnc: <10 IU/mL (ref <14.0)

## 2020-06-30 ENCOUNTER — Other Ambulatory Visit: Payer: BC Managed Care – PPO

## 2020-06-30 ENCOUNTER — Telehealth: Payer: Self-pay

## 2020-06-30 DIAGNOSIS — R102 Pelvic and perineal pain: Secondary | ICD-10-CM | POA: Diagnosis not present

## 2020-06-30 DIAGNOSIS — Z6820 Body mass index (BMI) 20.0-20.9, adult: Secondary | ICD-10-CM | POA: Diagnosis not present

## 2020-06-30 NOTE — Telephone Encounter (Signed)
Received a phone call from Theodore Demark, PA-C about getting patient in for a sooner appointment. Patient was added to Dr. Devin Going schedule for Thurs. 07/08/20 at 9:00 AM. Contact Rhonda for further information.

## 2020-07-01 ENCOUNTER — Encounter: Payer: Self-pay | Admitting: Speech Pathology

## 2020-07-01 ENCOUNTER — Ambulatory Visit: Payer: BC Managed Care – PPO | Attending: Nurse Practitioner | Admitting: Speech Pathology

## 2020-07-01 ENCOUNTER — Other Ambulatory Visit: Payer: Self-pay

## 2020-07-01 DIAGNOSIS — R41841 Cognitive communication deficit: Secondary | ICD-10-CM | POA: Diagnosis not present

## 2020-07-01 LAB — ANA: Anti Nuclear Antibody (ANA): NEGATIVE

## 2020-07-01 NOTE — Therapy (Signed)
[facility] [address] [place], [place], [address] Phone: [phone]   Fax:  [phone]  Speech Language Pathology Evaluation  Patient Details  Name: Brianna [name] MRN: [id number] Date of Birth: Apr 15, 1996 Referring Provider (SLP): Angus Seller NP   Encounter Date: 07/01/2020   End of Session - 07/01/20 1644    Visit Number 1    Number of Visits 17    Date for SLP Re-Evaluation 08/29/20    SLP Start Time 1540    SLP Stop Time  1625    SLP Time Calculation (min) 45 min    Activity Tolerance Patient tolerated treatment well           Past Medical History:  Diagnosis Date  . Asthma   . Bronchitis   . Depression     Past Surgical History:  Procedure Laterality Date  . WISDOM TOOTH EXTRACTION      There were no vitals filed for this visit.   Subjective Assessment - 07/01/20 1629    Subjective Pt reports brain fog, trouble with concentration, and disorganization of thoughts.    Patient is accompained by: Family member    Currently in Pain? Yes    Pain Score 5     Pain Location Pelvis    Pain Descriptors / Indicators Jabbing    Pain Type Chronic pain    Pain Onset More than a month ago    Pain Frequency Constant              SLP Evaluation [facility] - 07/01/20 1629      SLP Visit Information   SLP Received On 07/01/20    Referring Provider (SLP) Angus Seller NP    Onset Date Patient unable to pinpoint exact date    Medical Diagnosis Post covid      Subjective   Patient/Family Stated Goal To get back to my normal self      General Information   HPI Patient was diagnosed with Covid in August 2020.  She states that since that time she has been experiencing shortness of breath, brain fog, chest pain and tightness, sleep disturbance, night sweats, body pain, GI issues, dizzy spells.  Patient is currently seeing cardiology and GI.  Recent CTA showed that her lungs were clear.  [facility]  discussed that considering ongoing symptoms they would place referrals for speech therapy to help with cognitive rehabilitation, EP specialist to evaluate for POTS.    Behavioral/Cognition Anxious/melancholy      Balance Screen   Has the patient fallen in the past 6 months No    Has the patient had a decrease in activity level because of a fear of falling?  No    Is the patient reluctant to leave their home because of a fear of falling?  No      Prior Functional Status   Cognitive/Linguistic Baseline Within functional limits    Type of Home Other(Comment)     Lives With Friend(s)    Available Support Family    Vocation Part time employment      Cognition   Overall Cognitive Status Impaired/Different from baseline    Area of Impairment Memory;Attention      Standardized Assessments   Standardized Assessments  Other Assessment    Other Assessment SLUMS                           SLP Education - 07/01/20 1642  Education Details Provided edu on strategies for memory and attention.    Person(s) Educated Patient    Methods Demonstration;Handout    Comprehension Verbalized understanding;Need further instruction            SLP Short Term Goals - 07/01/20 1701      SLP SHORT TERM GOAL #1   Title Pt will recall 3 strategies to assist with recall of verbally presented information.    Baseline Does not use strategies    Time 4    Period Weeks    Status New      SLP SHORT TERM GOAL #2   Title Patient will demonstrate sustained attention by maintaining focus during a personally-relevant task for >20 minutes independently in a busy environment.    Baseline <20 min    Time 4    Period Weeks    Status New            SLP Long Term Goals - 07/01/20 1704      SLP LONG TERM GOAL #1   Title Patient will develop functional attention skills to effectively attend to and communicate in tasks of daily living in their functional living environment.    Time 8     Period Weeks    Status New      SLP LONG TERM GOAL #2   Title Patient will demonstrate use of memory strategies to schedule activities, recall weekly events and items to maintain safety to participate socially in functional living environment    Time 8    Period Weeks    Status New            Plan - 07/01/20 1647    Clinical Impression Statement Pt is a 25 yo female with a hx of depression, anxiety, and "undiagnosed" ADHD. Patient has said her psychiatrist did not want to begin her on a stimulant for adhd until her depression is leveled. Pt reported seeking medical attention for chronic pain for what she believes is undiagnosed endometriosis. Pt wanted to be evaluated today due to her onset of brain fog, fatigue, and disorganized thought processes. Upon evaluation, patient appeared melancholy. Difficulty concentrating noted during conversation - with consistent pauses during sentences to construct a thought. Patient scored a 22/30 on the SLUMS indiciating a mild cognitive impairment. Pt exhibited difficulty with attention which could contribute to her poor scores in memory (5/13). SLP rec skilled speech services to address cognitive-communication impairment by restorative and compensatory processes. Patient's goal is to "feel normal" and seeks therapy services to assist with her participation in ADLs.    Speech Therapy Frequency 2x / week    Duration 8 weeks    Treatment/Interventions Compensatory strategies;Cueing hierarchy;Functional tasks;Patient/family education;Cognitive reorganization;Compensatory techniques;SLP instruction and feedback    Potential to Achieve Goals Good    SLP Home Exercise Plan Provided with strategies to try at home.    Consulted and Agree with Plan of Care Patient           Patient will benefit from skilled therapeutic intervention in order to improve the following deficits and impairments:   Cognitive communication deficit    Problem List Patient Active  Problem List   Diagnosis Date Noted  . Gastroesophageal reflux disease 06/28/2020  . Recurrent major depressive disorder, in full remission (HCC) 06/28/2020  . Severe recurrent major depression without psychotic features (HCC) 06/28/2020  . Multiple joint pain 06/28/2020  . 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 Tachycardia 06/28/2020  . History of COVID-19  06/28/2020  . 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 Atypical chest pain 06/22/2020  . Dizziness 06/22/2020  . SOB (shortness of breath) 06/22/2020  . Palpitations 06/14/2020  . Anxiety 05/04/2020  . Internal and external hemorrhoids without complication 04/29/2020  . Pelvic prolapse 02/20/2020  . Acute recurrent pansinusitis 10/21/2019  . ETD (Eustachian tube dysfunction), bilateral 10/21/2019  . Cat allergy due to both airborne and skin contact 05/01/2016  . Chronic sore throat 05/01/2016  . Acne vulgaris 03/11/2014  . Mild intermittent asthma without complication 03/11/2014  . Seasonal allergic rhinitis 03/11/2014  . Seasonal allergies 03/11/2014    Hillard Danker [place], CBIS 07/01/2020, 5:08 PM  [facility] [address]. [place], [place], [address] Phone: [phone]   Fax:  [phone]  Name: Jon Kasparek MRN: [id number] Date of Birth: Mar 31, 1996

## 2020-07-01 NOTE — Patient Instructions (Signed)
SLP to see patient for f/u on cognitive-communication impairment.   Attempt to apply attention/memory strategies provided.

## 2020-07-05 DIAGNOSIS — R195 Other fecal abnormalities: Secondary | ICD-10-CM | POA: Diagnosis not present

## 2020-07-05 DIAGNOSIS — R002 Palpitations: Secondary | ICD-10-CM | POA: Diagnosis not present

## 2020-07-05 DIAGNOSIS — F411 Generalized anxiety disorder: Secondary | ICD-10-CM | POA: Diagnosis not present

## 2020-07-05 DIAGNOSIS — F900 Attention-deficit hyperactivity disorder, predominantly inattentive type: Secondary | ICD-10-CM | POA: Diagnosis not present

## 2020-07-05 DIAGNOSIS — F410 Panic disorder [episodic paroxysmal anxiety] without agoraphobia: Secondary | ICD-10-CM | POA: Diagnosis not present

## 2020-07-05 DIAGNOSIS — R109 Unspecified abdominal pain: Secondary | ICD-10-CM | POA: Diagnosis not present

## 2020-07-05 DIAGNOSIS — F331 Major depressive disorder, recurrent, moderate: Secondary | ICD-10-CM | POA: Diagnosis not present

## 2020-07-05 DIAGNOSIS — F3342 Major depressive disorder, recurrent, in full remission: Secondary | ICD-10-CM | POA: Diagnosis not present

## 2020-07-06 NOTE — Progress Notes (Signed)
Cardiology Office Note:    Date:  07/08/2020   ID:  [name], DOB 1996-04-12, MRN [id number]  PCP:  Malka So., MD    [facility]  Cardiologist:  No primary care provider on file.  Advanced Practice Provider:  No care team member to display Electrophysiologist:  None   Referring MD: Malka So., MD     History of Present Illness:    Brianna [name] is a 25 y.o. female with a hx of asthma, depression and COVID in 12/2018 who was referred by Dr. Derrell Lolling for further evaluation of chest pain and shortness of breath.  Unfortunately, the patient has had a prolonged post-COVID course with SOB, brain fog, night sweats, palpitations, myalgias and episodes of dizziness. Has been followed by COVID clinic where work-up including CRP and TSH have been normal. She saw [facility] on 06/22/20 for SOB, chest pain, dizziness and palpitations. She was recommended for TTE, Holter, and continued hydration as dehydration was thought to be the primary driver of her dizziness.  Patient wore a monitor for 4 days and is awaiting results. Was planned for TTE but cancelled with [facility].   Patient states that she has always had palpitations since 2016 attributes it to starting college and heightened anxiety. Symptoms worsened in 2020 with COVID and in 2021 when coming off OCPs. Has seen post-COVID clinic and was referred to Steffanie Dunn for evaluation with POTs. Orthostatics negative today (both blood pressure and heart rate). Still has some surges in HR with activity and finds herself getting occasional lightheadedness, which improves with resting. She has several other distressing symptoms including symptoms of IBS followed by GI, neuropathic pain with plans to see a spinal specialist, and is also followed by endocrinology.  Free T4 0.99 (normal), free T3 (3.0), Cr 0.69, HgB 13.0, CRP <1  Past Medical History:  Diagnosis Date  . Asthma   . Bronchitis   .  Depression     Past Surgical History:  Procedure Laterality Date  . WISDOM TOOTH EXTRACTION      Current Medications: Current Meds  Medication Sig  . clonazePAM (KLONOPIN) 0.5 MG tablet Take by mouth.  . dicyclomine (BENTYL) 10 MG capsule Take by mouth.  . Ferrous Sulfate Dried (EQ SLOW-RELEASE IRON) 45 MG TBCR Take 1 tablet by mouth every other day.  . fluticasone (FLONASE) 50 MCG/ACT nasal spray Flonase 50 mcg/actuation nasal spray,suspension  Spray 1 spray every day by intranasal route.  . loratadine (CLARITIN) 10 MG tablet loratadine 10 mg tablet  . Multiple Vitamin (MULTIVITAMIN) tablet Take 1 tablet by mouth daily.     Allergies:   Clindamycin   Social History   Socioeconomic History  . Marital status: Single    Spouse name: Not on file  . Number of children: Not on file  . Years of education: Not on file  . Highest education level: Not on file  Occupational History  . Not on file  Tobacco Use  . Smoking status: Former Smoker    Types: E-cigarettes  . Smokeless tobacco: Never Used  Substance and Sexual Activity  . Alcohol use: Yes    Alcohol/week: 20.0 standard drinks    Types: 8 Glasses of wine, 12 Cans of beer per week  . Drug use: No  . Sexual activity: Not on file  Other Topics Concern  . Not on file  Social History Narrative  . Not on file   Social Determinants of Health   Financial Resource Strain: Not  on file  Food Insecurity: Not on file  Transportation Needs: Not on file  Physical Activity: Not on file  Stress: Not on file  Social Connections: Not on file     Family History: The patient's family history includes Diabetes in her mother; Heart disease in her paternal uncle.  ROS:   Please see the history of present illness.    Review of Systems  Constitutional: Positive for malaise/fatigue. Negative for chills and fever.  HENT: Negative for nosebleeds.   Eyes: Negative for blurred vision and redness.  Respiratory: Positive for shortness  of breath.   Cardiovascular: Positive for chest pain and palpitations. Negative for orthopnea, claudication, leg swelling and PND.  Gastrointestinal: Positive for abdominal pain, diarrhea and nausea. Negative for melena.  Genitourinary: Negative for dysuria.  Musculoskeletal: Positive for joint pain and myalgias.  Neurological: Positive for dizziness, tingling and sensory change. Negative for loss of consciousness.  Endo/Heme/Allergies: Negative for polydipsia.  Psychiatric/Behavioral: The patient is nervous/anxious.     EKGs/Labs/Other Studies Reviewed:    The following studies were reviewed today: Cardiac monitor pending  EKG:  Patient declined ECG.  Recent Labs: No results found for requested labs within last 8760 hours.  Recent Lipid Panel No results found for: CHOL, TRIG, HDL, CHOLHDL, VLDL, LDLCALC, LDLDIRECT    Physical Exam:    VS:  BP 108/66   Pulse 61   Ht 5\' 4"  (1.626 m)   Wt 112 lb 12.8 oz (51.2 kg)   LMP 06/25/2020   SpO2 99%   BMI 19.36 kg/m     Wt Readings from Last 3 Encounters:  07/08/20 112 lb 12.8 oz (51.2 kg)  06/28/20 117 lb (53.1 kg)  10/31/16 140 lb (63.5 kg) (69 %, Z= 0.50)*   * Growth percentiles are based on CDC (Girls, 2-20 Years) data.     GEN:  Well nourished, well developed in no acute distress HEENT: Normal NECK: No JVD; No carotid bruits CARDIAC: RRR, no murmurs, rubs, gallops RESPIRATORY:  Clear to auscultation without rales, wheezing or rhonchi  ABDOMEN: Soft, non-tender, non-distended MUSCULOSKELETAL:  No edema; No deformity  SKIN: Warm and dry NEUROLOGIC:  Alert and oriented x 3 PSYCHIATRIC:  Normal affect   ASSESSMENT:    1. Palpitations   2. Chest pain of uncertain etiology   3. SOB (shortness of breath)   4. Anxiety    PLAN:    In order of problems listed above:  #SOB #Chest Discomfort #Generalized Malaise #Dizziness: #Prolonged Post-COVID syndrome Orthostatic vitals today negative with no evidence of  orthostatic tachycardia to suggest POTs or orthostatic hypotension. Patient has had ongoing symptoms which appeared to have worsened after COVID infection in 2020. Has seen post-COVID clinic and multiple specialists including recent visit to Cardiology. Recently underwent cardiac monitor placement with results pending. Symptoms concerning for dysautonomia which are impacting her quality of life. Will continue with symptomatic management and follow-up on monitor results as well as TTE. -Follow-up cardiac monitor results -Check TTE (cancelled one with [facility]) -Discussed the importance of compression stockings/abdominal binders to help with orthostatic symptoms -Increase fluid and salt intake throughout the day -Graduated exercise program -Will see back in 1 month to see how symptoms are progressing  #Palpitations: Monitor placed by [facility]. -Follow-up monitor results  #IBS: -Follow-up with GI as scheduled  #Anxiety: -Follow-up with PCP as scheduled  #Neuropathy: Patient reports diffuse muscle cramping and shooting neuropathic pain. -Planned to see spinal specialist -Recommended Mag at night to help with cramps  Medication Adjustments/Labs and Tests Ordered: Current medicines are reviewed at length with the patient today.  Concerns regarding medicines are outlined above.  Orders Placed This Encounter  Procedures  . ECHOCARDIOGRAM COMPLETE   No orders of the defined types were placed in this encounter.   Patient Instructions  Medication Instructions:  Your physician recommends that you continue on your current medications as directed. Please refer to the Current Medication list given to you today.  *If you need a refill on your cardiac medications before your next appointment, please call your pharmacy*   Lab Work: None If you have labs (blood work) drawn today and your tests are completely normal, you will receive your results only by: Marland Kitchen MyChart Message (if you  have MyChart) OR . A paper copy in the mail If you have any lab test that is abnormal or we need to change your treatment, we will call you to review the results.   Testing/Procedures: Your physician has requested that you have an echocardiogram. Echocardiography is a painless test that uses sound waves to create images of your heart. It provides your doctor with information about the size and shape of your heart and how well your heart's chambers and valves are working. This procedure takes approximately one hour. There are no restrictions for this procedure.   Follow-Up: At [facility], you and your health needs are our priority.  As part of our continuing mission to provide you with exceptional heart care, we have created designated Provider Care Teams.  These Care Teams include your primary Cardiologist (physician) and Advanced Practice Providers (APPs -  Physician Assistants and Nurse Practitioners) who all work together to provide you with the care you need, when you need it.  We recommend signing up for the patient portal called "MyChart".  Sign up information is provided on this After Visit Summary.  MyChart is used to connect with patients for Virtual Visits (Telemedicine).  Patients are able to view lab/test results, encounter notes, upcoming appointments, etc.  Non-urgent messages can be sent to your provider as well.   To learn more about what you can do with MyChart, go to [email or web address].    Your next appointment:   1 month(s)  The format for your next appointment:   In Person  Provider:   You may see Laurance Flatten, MD or one of the following Advanced Practice Providers on your designated Care Team:    Tereso Newcomer, PA-C  Chelsea Aus, [place]    Other Instructions      Signed, Meriam Sprague, MD  07/08/2020 12:19 PM    [facility]

## 2020-07-07 DIAGNOSIS — R7989 Other specified abnormal findings of blood chemistry: Secondary | ICD-10-CM | POA: Diagnosis not present

## 2020-07-08 ENCOUNTER — Ambulatory Visit: Payer: BC Managed Care – PPO | Admitting: Cardiology

## 2020-07-08 ENCOUNTER — Encounter: Payer: Self-pay | Admitting: Cardiology

## 2020-07-08 ENCOUNTER — Other Ambulatory Visit: Payer: Self-pay

## 2020-07-08 VITALS — BP 108/66 | HR 61 | Ht 64.0 in | Wt 112.8 lb

## 2020-07-08 DIAGNOSIS — R002 Palpitations: Secondary | ICD-10-CM

## 2020-07-08 DIAGNOSIS — F419 Anxiety disorder, unspecified: Secondary | ICD-10-CM | POA: Diagnosis not present

## 2020-07-08 DIAGNOSIS — R079 Chest pain, unspecified: Secondary | ICD-10-CM

## 2020-07-08 DIAGNOSIS — R0602 Shortness of breath: Secondary | ICD-10-CM | POA: Diagnosis not present

## 2020-07-08 DIAGNOSIS — M542 Cervicalgia: Secondary | ICD-10-CM | POA: Diagnosis not present

## 2020-07-08 DIAGNOSIS — M545 Low back pain, unspecified: Secondary | ICD-10-CM | POA: Diagnosis not present

## 2020-07-08 DIAGNOSIS — G8929 Other chronic pain: Secondary | ICD-10-CM | POA: Diagnosis not present

## 2020-07-08 DIAGNOSIS — F411 Generalized anxiety disorder: Secondary | ICD-10-CM | POA: Diagnosis not present

## 2020-07-08 NOTE — Patient Instructions (Signed)
Medication Instructions:  Your physician recommends that you continue on your current medications as directed. Please refer to the Current Medication list given to you today.  *If you need a refill on your cardiac medications before your next appointment, please call your pharmacy*   Lab Work: None If you have labs (blood work) drawn today and your tests are completely normal, you will receive your results only by: Marland Kitchen MyChart Message (if you have MyChart) OR . A paper copy in the mail If you have any lab test that is abnormal or we need to change your treatment, we will call you to review the results.   Testing/Procedures: Your physician has requested that you have an echocardiogram. Echocardiography is a painless test that uses sound waves to create images of your heart. It provides your doctor with information about the size and shape of your heart and how well your heart's chambers and valves are working. This procedure takes approximately one hour. There are no restrictions for this procedure.   Follow-Up: At [facility], you and your health needs are our priority.  As part of our continuing mission to provide you with exceptional heart care, we have created designated Provider Care Teams.  These Care Teams include your primary Cardiologist (physician) and Advanced Practice Providers (APPs -  Physician Assistants and Nurse Practitioners) who all work together to provide you with the care you need, when you need it.  We recommend signing up for the patient portal called "MyChart".  Sign up information is provided on this After Visit Summary.  MyChart is used to connect with patients for Virtual Visits (Telemedicine).  Patients are able to view lab/test results, encounter notes, upcoming appointments, etc.  Non-urgent messages can be sent to your provider as well.   To learn more about what you can do with MyChart, go to [email or web address].    Your next appointment:   1  month(s)  The format for your next appointment:   In Person  Provider:   You may see Laurance Flatten, MD or one of the following Advanced Practice Providers on your designated Care Team:    Tereso Newcomer, PA-C  Chelsea Aus, [place]    Other Instructions

## 2020-07-09 ENCOUNTER — Other Ambulatory Visit: Payer: Self-pay

## 2020-07-09 ENCOUNTER — Encounter: Payer: Self-pay | Admitting: Physical Medicine and Rehabilitation

## 2020-07-09 ENCOUNTER — Encounter
Payer: BC Managed Care – PPO | Attending: Physical Medicine and Rehabilitation | Admitting: Physical Medicine and Rehabilitation

## 2020-07-09 ENCOUNTER — Telehealth: Payer: Self-pay

## 2020-07-09 ENCOUNTER — Ambulatory Visit (HOSPITAL_COMMUNITY): Payer: BC Managed Care – PPO | Attending: Internal Medicine

## 2020-07-09 VITALS — BP 106/68 | HR 78 | Temp 98.2°F | Ht 64.0 in | Wt 113.6 lb

## 2020-07-09 DIAGNOSIS — M6289 Other specified disorders of muscle: Secondary | ICD-10-CM | POA: Diagnosis not present

## 2020-07-09 DIAGNOSIS — G894 Chronic pain syndrome: Secondary | ICD-10-CM | POA: Diagnosis not present

## 2020-07-09 DIAGNOSIS — L659 Nonscarring hair loss, unspecified: Secondary | ICD-10-CM | POA: Diagnosis not present

## 2020-07-09 DIAGNOSIS — R198 Other specified symptoms and signs involving the digestive system and abdomen: Secondary | ICD-10-CM

## 2020-07-09 DIAGNOSIS — R7989 Other specified abnormal findings of blood chemistry: Secondary | ICD-10-CM | POA: Diagnosis not present

## 2020-07-09 DIAGNOSIS — R079 Chest pain, unspecified: Secondary | ICD-10-CM | POA: Diagnosis not present

## 2020-07-09 DIAGNOSIS — R0789 Other chest pain: Secondary | ICD-10-CM | POA: Diagnosis not present

## 2020-07-09 DIAGNOSIS — R002 Palpitations: Secondary | ICD-10-CM

## 2020-07-09 DIAGNOSIS — R0602 Shortness of breath: Secondary | ICD-10-CM | POA: Diagnosis not present

## 2020-07-09 DIAGNOSIS — F3342 Major depressive disorder, recurrent, in full remission: Secondary | ICD-10-CM

## 2020-07-09 DIAGNOSIS — M797 Fibromyalgia: Secondary | ICD-10-CM

## 2020-07-09 DIAGNOSIS — R42 Dizziness and giddiness: Secondary | ICD-10-CM | POA: Diagnosis not present

## 2020-07-09 DIAGNOSIS — Z833 Family history of diabetes mellitus: Secondary | ICD-10-CM | POA: Diagnosis not present

## 2020-07-09 DIAGNOSIS — N912 Amenorrhea, unspecified: Secondary | ICD-10-CM | POA: Diagnosis not present

## 2020-07-09 DIAGNOSIS — R634 Abnormal weight loss: Secondary | ICD-10-CM | POA: Diagnosis not present

## 2020-07-09 LAB — ECHOCARDIOGRAM COMPLETE
Area-P 1/2: 3.81 cm2
S' Lateral: 2.4 cm

## 2020-07-09 NOTE — Telephone Encounter (Signed)
-----   Message from Meriam Sprague, MD sent at 07/09/2020  1:30 PM EST ----- Echo looks great with normal pumping function, no significant valvular abnormalities.

## 2020-07-09 NOTE — Patient Instructions (Addendum)
Apply emu oil. Trigger point injections -Discussed following foods that may reduce pain: 1) Ginger 2) Blueberries 3) Salmon 4) Pumpkin seeds 5) dark chocolate 6) turmeric 7) tart cherries 8) virgin olive oil 9) chilli peppers 10) mint

## 2020-07-09 NOTE — Progress Notes (Signed)
Subjective:    [name], female    DOB: 1995-10-27, 25 y.o.   MRN: [id number]  HPI  Ms Asper is a 25 year old woman who presents to establish care for chronic nerve pain.  1) Chronic nerve pain:  -Pain on average is 9/10 -Pain right now is 3/10 -She uses a heating pad. Alternates ice for short times -She has occasional flare-ups concentrated around her mid-section.  -She is currently on Cymbalta which can help with nerve pain.   2) Pelvic floor dysfunction: -She is starting with a new therapist soon.  -She believes she has endo  3) GI symptoms -Struggled with these since she was 25 years old.   4) History of multiple suicidal ideations  5) Spasms: -present throughout her body -her chriopractor may start her on a muscle relaxer.    Pain Inventory Average Pain 9 Pain Right Now 3 My pain is constant, sharp, burning, stabbing, tingling and aching  In the last 24 hours, has pain interfered with the following? General activity 8 Relation with others 8 Enjoyment of life 8 What TIME of day is your pain at its worst? evening and night Sleep (in general) Poor  Pain is worse with: walking, bending, sitting, inactivity and some activites Pain improves with: rest, heat/ice and therapy/exercise Relief from Meds: 0  walk without assistance how many minutes can you walk? depends on flare ability to climb steps?  yes do you drive?  yes  employed # of hrs/week ~20 Do you have any goals in this area?  no  bladder control problems bowel control problems weakness tingling spasms dizziness confusion depression anxiety suicidal thoughts  Any changes since last visit?  no  Any changes since last visit?  no Angus Seller NP referring provider    Family History  Problem Relation Age of Onset  . Diabetes Mother   . Heart disease Paternal Uncle    Social History   Socioeconomic History  . Marital status: Single    Spouse name: Not on  file  . Number of children: Not on file  . Years of education: Not on file  . Highest education level: Not on file  Occupational History  . Not on file  Tobacco Use  . Smoking status: Former Smoker    Types: E-cigarettes  . Smokeless tobacco: Never Used  Substance and Sexual Activity  . Alcohol use: Yes    Alcohol/week: 20.0 standard drinks    Types: 8 Glasses of wine, 12 Cans of beer per week  . Drug use: No  . Sexual activity: Not on file  Other Topics Concern  . Not on file  Social History Narrative  . Not on file   Social Determinants of Health   Financial Resource Strain: Not on file  Food Insecurity: Not on file  Transportation Needs: Not on file  Physical Activity: Not on file  Stress: Not on file  Social Connections: Not on file   Past Surgical History:  Procedure Laterality Date  . WISDOM TOOTH EXTRACTION     Past Medical History:  Diagnosis Date  . Asthma   . Bronchitis   . Depression    BP 106/68   Pulse 78   Temp 98.2 F (36.8 C)   Ht 5\' 4"  (1.626 m)   Wt 113 lb 9.6 oz (51.5 kg)   LMP 06/25/2020   SpO2 99%   BMI 19.50 kg/m   Opioid Risk Score:   Fall Risk Score:  `1  Depression screen PHQ 2/9  Depression screen [facility] 2/9 07/09/2020 06/28/2020  Decreased Interest 3 1  Down, Depressed, Hopeless 3 2  PHQ - 2 Score 6 3  Altered sleeping 3 3  Tired, decreased energy 3 3  Change in appetite 3 3  Feeling bad or failure about yourself  3 3  Trouble concentrating 3 3  Moving slowly or fidgety/restless 3 3  Suicidal thoughts 1 3  PHQ-9 Score 25 24  Difficult doing work/chores Extremely dIfficult -    Review of Systems  Constitutional: Positive for appetite change, diaphoresis and unexpected weight change.  HENT: Negative.   Eyes: Negative.   Respiratory: Positive for shortness of breath.   Cardiovascular: Positive for leg swelling.  Gastrointestinal: Positive for abdominal pain, constipation, diarrhea and nausea.  Endocrine: Negative.    Genitourinary:       Bladder control  Musculoskeletal: Positive for back pain and myalgias.  Skin: Negative.   Allergic/Immunologic: Negative.   Neurological: Positive for dizziness and weakness.       Tingling  Hematological: Bruises/bleeds easily.  Psychiatric/Behavioral: Positive for confusion, dysphoric mood and suicidal ideas. The patient is nervous/anxious.   All other systems reviewed and are negative.      Objective:   Physical Exam Gen: no distress, normal appearing HEENT: oral mucosa pink and moist, NCAT Cardio: Reg rate Chest: normal effort, normal rate of breathing Abd: soft, non-distended Ext: no edema Psych: pleasant, normal affect Skin: intact Neuro: Alert and oriented x3 Musculoskeletal: Diffuse tender points throughout the body on both side. Trigger poiints throughout     Assessment & Plan:  1) Chronic Pain Syndrome secondary to fibromyalgia -Discussed current symptoms of pain and history of pain.  -Discussed benefits of exercise in reducing pain. -Apply emu oil. -Lupus panel ordered.  -RA and ANA reviewed and negative.  -Discussed trigger point injections -Discussed following foods that may reduce pain: 1) Ginger 2) Blueberries 3) Salmon 4) Pumpkin seeds 5) dark chocolate 6) turmeric 7) tart cherries 8) virgin olive oil 9) chilli peppers 10) mint  Link to further information on diet for chronic pain: [email or web address]  2) Pelvic floor dysfunction: -Continue pelvic floor therapy.

## 2020-07-09 NOTE — Telephone Encounter (Signed)
The patient has been notified of the result and verbalized understanding.  All questions (if any) were answered. Leanord Hawking, RN 07/09/2020 1:37 PM

## 2020-07-13 ENCOUNTER — Other Ambulatory Visit: Payer: Self-pay

## 2020-07-13 ENCOUNTER — Ambulatory Visit: Payer: BC Managed Care – PPO | Admitting: Speech Pathology

## 2020-07-13 ENCOUNTER — Encounter: Payer: Self-pay | Admitting: Speech Pathology

## 2020-07-13 DIAGNOSIS — R41841 Cognitive communication deficit: Secondary | ICD-10-CM

## 2020-07-13 NOTE — Therapy (Signed)
[place] [place], [place], [address] Phone: [phone]   Fax:  [phone]  Speech Language Pathology Treatment and Discharge  Patient Details  Name: Brianna [name] MRN: [id number] Date of Birth: 19-Apr-1996 Referring Provider (SLP): Lazaro Arms NP   Encounter Date: 07/13/2020   End of Session - 07/13/20 1201    Visit Number 2    Number of Visits 17    Date for SLP Re-Evaluation 08/29/20    SLP Start Time 1105    SLP Stop Time  1145    SLP Time Calculation (min) 40 min    Activity Tolerance Patient tolerated treatment well           Past Medical History:  Diagnosis Date  . Asthma   . Bronchitis   . Depression     Past Surgical History:  Procedure Laterality Date  . WISDOM TOOTH EXTRACTION      There were no vitals filed for this visit.            SLP Education - 07/13/20 1156    Education Details Provided edu on attention deficits impacting word-finding and auditory recall of information. Pt was in agreement with utilizing word-finding strategies and attention strategies as needed in her living environment.    Person(s) Educated Patient    Methods Explanation;Demonstration;Handout    Comprehension Verbalized understanding            SLP Short Term Goals - 07/13/20 1154      SLP SHORT TERM GOAL #1   Title Pt will recall 3 strategies to assist with recall of verbally presented information.    Baseline Does not use strategies    Time 4    Period Weeks    Status Achieved      SLP SHORT TERM GOAL #2   Title Patient will demonstrate sustained attention by maintaining focus during a personally-relevant task for >20 minutes independently in a busy environment.    Baseline <20 min    Time 4    Period Weeks    Status Achieved            SLP Long Term Goals - 07/13/20 1155      SLP LONG TERM GOAL #1   Title Patient will develop functional attention skills to effectively  attend to and communicate in tasks of daily living in their functional living environment.    Time 8    Period Weeks    Status Deferred      SLP LONG TERM GOAL #2   Title Patient will demonstrate use of memory strategies to schedule activities, recall weekly events and items to maintain safety to participate socially in functional living environment    Time 8    Period Weeks    Status Achieved            Plan - 07/13/20 1202    Clinical Impression Statement Pt is a 25 yo female with a hx of depression, anxiety, and ADHD. Patient has said her psychiatrist did not want to begin her on a stimulant for ADHD until her depression is leveled. Upon speaking with patient today, SLP and pt were in agreement on discharging from treatment to for further assessment/treatment on underlying attention deficit. SLP perceives attention to be impacting other areas of cognition (immediate memory/word finding). SLP provided and discussed with patient the attention, memory and word finding strategies to be utilized in order to compensate with some of those deficits. Pt was  able to recall strategies from last session independently. No word finding deficits are noted in conversation. Pt reported it is more "losing of thoughts" which correlates with ADHD diagnosis. At this time, SLP is to d/c patient from therapy. Patient instructed to reach out to PCP if there are any changes.    Speech Therapy Frequency 2x / week    Duration 8 weeks    Treatment/Interventions Compensatory strategies;Cueing hierarchy;Functional tasks;Patient/family education;Cognitive reorganization;Compensatory techniques;SLP instruction and feedback    Potential to Achieve Goals Good    SLP Home Exercise Plan Provided with strategies to try at home.    Consulted and Agree with Plan of Care Patient           Patient will benefit from skilled therapeutic intervention in order to improve the following deficits and impairments:   Cognitive  communication deficit    Problem List Patient Active Problem List   Diagnosis Date Noted  . Gastroesophageal reflux disease 06/28/2020  . Recurrent major depressive disorder, in full remission ([place]) 06/28/2020  . Severe recurrent major depression without psychotic features ([place]) 06/28/2020  . Multiple joint pain 06/28/2020  . 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 Tachycardia 06/28/2020  . History of COVID-19 06/28/2020  . 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 Atypical chest pain 06/22/2020  . Dizziness 06/22/2020  . SOB (shortness of breath) 06/22/2020  . Palpitations 06/14/2020  . Anxiety 05/04/2020  . Internal and external hemorrhoids without complication 71/83/6725  . Pelvic prolapse 02/20/2020  . Acute recurrent pansinusitis 10/21/2019  . ETD (Eustachian tube dysfunction), bilateral 10/21/2019  . Cat allergy due to both airborne and skin contact 05/01/2016  . Chronic sore throat 05/01/2016  . Acne vulgaris 03/11/2014  . Mild intermittent asthma without complication 50/05/6427  . Seasonal allergic rhinitis 03/11/2014  . Seasonal allergies 03/11/2014   SPEECH THERAPY DISCHARGE SUMMARY  Visits from Start of Care: 2  Current functional level related to goals / functional outcomes: After further discussion with patient about her recent ADHD diagnosis, she continues to demonstrate attention deficit and s/sx associated with that diagnosis. SLP has provided patient with strategies to assist with symptom management; however, is to defer treatment until underlying ADHD is managed. SLP referred pt to reach out to her psychiatrist/psychologist to speak about treatment options.    Remaining deficits: Attention  Plan: Patient agrees to discharge.  Patient goals were met. Patient is being discharged due to meeting the stated rehab goals.  ?????      Ihor Austin, Homer C Jones, CBIS  07/13/2020, 12:08 PM  [place] [place], [place], [address] Phone: [phone]   Fax:  [phone]   Name: Brianna [name] MRN: [id number] Date of Birth: [birth year]

## 2020-07-13 NOTE — Patient Instructions (Addendum)
SLP rec follow-up with your psychologist/psychiatrist regarding your recent attention deficit diagnosis due to your described impact on quality of life.   Please reach out with any follow-up questions, Brianna [name]!

## 2020-07-15 ENCOUNTER — Ambulatory Visit: Payer: BC Managed Care – PPO | Admitting: Speech Pathology

## 2020-07-15 DIAGNOSIS — R7989 Other specified abnormal findings of blood chemistry: Secondary | ICD-10-CM | POA: Diagnosis not present

## 2020-07-17 DIAGNOSIS — F411 Generalized anxiety disorder: Secondary | ICD-10-CM | POA: Diagnosis not present

## 2020-07-19 DIAGNOSIS — N926 Irregular menstruation, unspecified: Secondary | ICD-10-CM | POA: Diagnosis not present

## 2020-07-19 DIAGNOSIS — N9489 Other specified conditions associated with female genital organs and menstrual cycle: Secondary | ICD-10-CM | POA: Diagnosis not present

## 2020-07-19 DIAGNOSIS — M62838 Other muscle spasm: Secondary | ICD-10-CM | POA: Diagnosis not present

## 2020-07-23 ENCOUNTER — Ambulatory Visit: Payer: BC Managed Care – PPO | Attending: Internal Medicine | Admitting: Physical Therapy

## 2020-07-23 ENCOUNTER — Encounter: Payer: Self-pay | Admitting: Physical Therapy

## 2020-07-23 ENCOUNTER — Other Ambulatory Visit: Payer: Self-pay

## 2020-07-23 DIAGNOSIS — R278 Other lack of coordination: Secondary | ICD-10-CM | POA: Insufficient documentation

## 2020-07-23 DIAGNOSIS — R252 Cramp and spasm: Secondary | ICD-10-CM | POA: Insufficient documentation

## 2020-07-23 DIAGNOSIS — M6281 Muscle weakness (generalized): Secondary | ICD-10-CM | POA: Diagnosis not present

## 2020-07-23 NOTE — Patient Instructions (Addendum)
   Guided Meditation for Pelvic Floor Relaxation  [employer]    The "Pelvic Drop" to Release Pelvic Floor Tension: Three Visualizations    5 Minute Pelvic Floor Release - Relax Pelvic Tension FAST!  [facility] [address], [address] [place], [place] [address] Phone # [phone] Fax [phone]
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
## 2020-07-27 ENCOUNTER — Encounter: Payer: BC Managed Care – PPO | Admitting: Internal Medicine

## 2020-07-27 ENCOUNTER — Institutional Professional Consult (permissible substitution): Payer: BC Managed Care – PPO | Admitting: Cardiology

## 2020-07-28 ENCOUNTER — Other Ambulatory Visit: Payer: Self-pay

## 2020-07-28 ENCOUNTER — Encounter: Payer: Self-pay | Admitting: Physical Therapy

## 2020-07-28 ENCOUNTER — Ambulatory Visit: Payer: BC Managed Care – PPO | Admitting: Physical Therapy

## 2020-07-28 DIAGNOSIS — R252 Cramp and spasm: Secondary | ICD-10-CM

## 2020-07-28 DIAGNOSIS — M6281 Muscle weakness (generalized): Secondary | ICD-10-CM

## 2020-07-28 DIAGNOSIS — R278 Other lack of coordination: Secondary | ICD-10-CM | POA: Diagnosis not present

## 2020-07-28 NOTE — Therapy (Addendum)
[facility] [address] [place], [place], [address] Phone: [phone]   Fax:  [phone]  Physical Therapy Treatment  Patient Details  Name: Myrene Bougher MRN: [id number] Date of Birth: 01-25-1996 Referring Provider (PT): Dr. Clide Deutscher. Raulker   Encounter Date: 07/28/2020   PT End of Session - 07/28/20 1442    Visit Number 2    Date for PT Re-Evaluation 11/22/20    Authorization Type BCBS    PT Start Time 1400    PT Stop Time 1440    PT Time Calculation (min) 40 min    Activity Tolerance Patient tolerated treatment well;Patient limited by pain    Behavior During Therapy [facility] for tasks assessed/performed           Past Medical History:  Diagnosis Date  . Asthma   . Bronchitis   . Depression     Past Surgical History:  Procedure Laterality Date  . WISDOM TOOTH EXTRACTION      There were no vitals filed for this visit.   Subjective Assessment - 07/28/20 1404    Subjective I am on my cycle and now in alot of pain.    Patient Stated Goals reduce pain    Currently in Pain? Yes    Pain Score 8     Pain Location Pelvis    Pain Orientation Lower    Pain Descriptors / Indicators Aching;Heaviness;Spasm    Pain Type Chronic pain    Pain Radiating Towards neuropathy in low back and pain will shoot down to her feet    Pain Onset More than a month ago    Pain Frequency Constant    Aggravating Factors  not sure    Pain Relieving Factors not sure    Multiple Pain Sites No                             [facility] Adult PT Treatment/Exercise - 07/28/20 0001      Manual Therapy   Manual Therapy Myofascial release;Soft tissue mobilization    Soft tissue mobilization ciruclar massage to the abdomen and I love You massage to imporve peristalic motion of the intestine    Myofascial Release falcial release fo the thoracic diphragm, respiratory diaphragm, an durogenital diaphragm going throug all  places of fascial while listening to medtation                    PT Short Term Goals - 07/23/20 1715      PT SHORT TERM GOAL #1   Title patient is independent with initial HEP    Time 4    Period Weeks    Status New    Target Date 08/20/20      PT SHORT TERM GOAL #2   Title pelvis in correct alignment due to reduction of muscle imbalances    Time 4    Period Weeks    Target Date 08/20/20      PT SHORT TERM GOAL #3   Title patient is able to perform diaphragmatic breathing to bulge the pelvic floor    Time 4    Period Weeks    Status New    Target Date 08/20/20      PT SHORT TERM GOAL #4   Title patient understands ways to calm the nervous system to reduce her pain    Time 4    Period Weeks    Status New  Target Date 08/20/20      PT SHORT TERM GOAL #5   Title patient reports her pain intensity is reduced 25% of the day    Time 4    Period Weeks    Status New    Target Date 08/20/20             PT Long Term Goals - 07/23/20 1717      PT LONG TERM GOAL #1   Title Patient is independent with advanced HEP    Time 4    Period Months    Status New    Target Date 11/22/20      PT LONG TERM GOAL #2   Title pelvic pain decreased </= 3/10 50-60% of the time due to reduction of trigger points    Time 4    Period Months    Status New    Target Date 11/22/20      PT LONG TERM GOAL #3   Title patient is able to fully empty her bowels due to improve pelvic floor coordination during a bowel movement    Time 4    Period Months    Status New    Target Date 11/22/20      PT LONG TERM GOAL #4   Title patient is able to fully empty her bladder and is able to wait 2-3 hours to urinate due to relaxation of the pelvic floor    Time 4    Period Months    Status New    Target Date 11/22/20                 Plan - 07/28/20 1442    Clinical Impression Statement Patient has a high deductable so she is going to learn how to do alot of therapy at  home. Patient had increased pain due to being on her cycle. Patient pain decreased to 6/10 after manual work. Patient was able to tolerate the manual work with heat. Patient just started therapy so has not met goals. Patient will benefit from skilled therapy to improve pelvic floor coordination and strength while reducing her pain to improve daily tasks.    Personal Factors and Comorbidities Time since onset of injury/illness/exacerbation;Finances;Sex;Profession;Past/Current Experience    Examination-Activity Limitations Locomotion Level;Toileting;Stand    Examination-Participation Restrictions Occupation;Community Activity;Interpersonal Relationship    Stability/Clinical Decision Making Stable/Uncomplicated    Rehab Potential Excellent    PT Frequency 1x / week    PT Duration Other (comment)   4 months   PT Treatment/Interventions ADLs/Self Care Home Management;Biofeedback;Cryotherapy;Electrical Stimulation;Iontophoresis 83m/ml Dexamethasone;Moist Heat;Ultrasound;Neuromuscular re-education;Therapeutic exercise;Therapeutic activities;Patient/family education;Manual techniques;Dry needling;Taping;Spinal Manipulations;Joint Manipulations    PT Next Visit Plan diaphragmatic breathing wot bulge the pelvic floor; fascial release in the 3 diaphragms; ways to calm the nervous system; manual work to abdomina, and thighs; manual work internally if tolerate    Recommended Other Services MD signed initial note    Consulted and Agree with Plan of Care Patient           Patient will benefit from skilled therapeutic intervention in order to improve the following deficits and impairments:  Decreased coordination,Increased fascial restricitons,Decreased endurance,Decreased activity tolerance,Dizziness,Pain,Decreased strength  Visit Diagnosis: Muscle weakness (generalized)  Cramp and spasm  Other lack of coordination     Problem List Patient Active Problem List   Diagnosis Date Noted  .  Gastroesophageal reflux disease 06/28/2020  . Recurrent major depressive disorder, in full remission ([facility] 06/28/2020  . Severe recurrent major depression without psychotic features (  [place]) 06/28/2020  . Multiple joint pain 06/28/2020  . 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 Tachycardia 06/28/2020  . History of COVID-19 06/28/2020  . 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 Atypical chest pain 06/22/2020  . Dizziness 06/22/2020  . SOB (shortness of breath) 06/22/2020  . Palpitations 06/14/2020  . Anxiety 05/04/2020  . Internal and external hemorrhoids without complication 58/30/9407  . Pelvic prolapse 02/20/2020  . Acute recurrent pansinusitis 10/21/2019  . ETD (Eustachian tube dysfunction), bilateral 10/21/2019  . Cat allergy due to both airborne and skin contact 05/01/2016  . Chronic sore throat 05/01/2016  . Acne vulgaris 03/11/2014  . Mild intermittent asthma without complication 68/12/8108  . Seasonal allergic rhinitis 03/11/2014  . Seasonal allergies 03/11/2014    Earlie Counts, PT 07/28/20 2:46 PM   [facility] [address] [place], [place], [address] Phone: [phone]   Fax:  [phone]  Name: Rebekha Diveley MRN: [id number] Date of Birth: Jul 02, 1995  PHYSICAL THERAPY DISCHARGE SUMMARY  Visits from Start of Care: 2  Current functional level related to goals / functional outcomes: See above. Patient is going somewhere else for physical therapy at [facility].    Remaining deficits: See above.   Education / Equipment: HEP Plan: Patient agrees to discharge.  Patient goals were not met. Patient is being discharged due to the patient's request.  Thank you for the referral. Earlie Counts, PT 08/13/20 12:23 PM  ?????

## 2020-07-29 ENCOUNTER — Ambulatory Visit (INDEPENDENT_AMBULATORY_CARE_PROVIDER_SITE_OTHER): Payer: BC Managed Care – PPO | Admitting: Internal Medicine

## 2020-07-29 ENCOUNTER — Encounter: Payer: Self-pay | Admitting: Internal Medicine

## 2020-07-29 VITALS — BP 116/70 | HR 85 | Wt 116.5 lb

## 2020-07-29 DIAGNOSIS — M2559 Pain in other specified joint: Secondary | ICD-10-CM

## 2020-07-29 DIAGNOSIS — M2550 Pain in unspecified joint: Secondary | ICD-10-CM

## 2020-07-29 DIAGNOSIS — K582 Mixed irritable bowel syndrome: Secondary | ICD-10-CM

## 2020-07-29 DIAGNOSIS — M6289 Other specified disorders of muscle: Secondary | ICD-10-CM | POA: Diagnosis not present

## 2020-07-29 DIAGNOSIS — Z3009 Encounter for other general counseling and advice on contraception: Secondary | ICD-10-CM

## 2020-07-29 DIAGNOSIS — L249 Irritant contact dermatitis, unspecified cause: Secondary | ICD-10-CM

## 2020-07-29 DIAGNOSIS — N809 Endometriosis, unspecified: Secondary | ICD-10-CM

## 2020-07-29 DIAGNOSIS — G8929 Other chronic pain: Secondary | ICD-10-CM

## 2020-07-29 DIAGNOSIS — F411 Generalized anxiety disorder: Secondary | ICD-10-CM | POA: Diagnosis not present

## 2020-07-29 NOTE — Patient Instructions (Signed)
Thank you for allowing [place] to provide your care today.   I have ordered the following labs for you:  Please use Sarna lotion as needed for itching dry skin   Please follow-up once we are able to obtain your labs.    Please call the internal medicine center clinic if you have any questions or concerns, we may be able to help and keep you from a long and expensive emergency room wait. Our clinic and after hours phone number is [phone], the best time to call is Monday through Friday 9 am to 4 pm but there is always someone available 24/7 if you have an emergency. If you need medication refills please notify your pharmacy one week in advance and they will send [place] a request.

## 2020-07-29 NOTE — Progress Notes (Signed)
CC: chronic pain  HPI:  [name] is a 25 y.o. with PMH as below presenting today to establish care.   Please see A&P for assessment of the patient's acute and chronic medical conditions.   PMH Chronic pain  Pelvic floor dysfunction IBS Depression Asthma  Medication Cymbalta 40 mg qd  Klonopin .5 mg qd  Bentyl 10 mg bid  Flexeril 5-10 mg prn at night  claritin 10 mg qd Ferrous sulfate every other day   [employer] Lives in [place] and sustainability and works at [employer] part time currently  Use TSH vape - does help distract from pain and helps anxiety  No alcohol or tobacco use   She has had diffuse chronic pain for years in her back, joints and muscles. The pain moves around the body.Today her wrists and ankles are hurting more. She has sensitivity to cold and feels like she has poor ciruclation but no episodes of fingers turning white or numb with the cold. She does have chronic tingling in her lower extremities with pain in her lower back that seems to radiate down her legs. She sees PM&R and a psychiatrist for her pain currently. She states there has been a lot of previous work-up and the thought now is that she might have fibromyalgia. She has recently been referred to endocrinology and rheumatology as well. Her cymbalta was just increased from 76m to 40 mg. [facility] which we are unable to see here. Her appointment with rheumatology is not until June, and she is hoping to see someone closer and sooner.  She was recently started on flexeril 5 mg tid prn and has tried it twice at night. She does feel like this has helped more than some other medications.  Prior ANA, ESR, and RF done in February were normal.  She has chronic abdominal pain, which she has had for many years, and is diagnosed with IBS, for which she has seen GI. She has both intermittent constipation and diarrhea. She has  altered her diet to exclude dairy and gluten, which has somewhat helped and tries to eat a low inflammatory diet. Since these dietary changes last year she has lost about 40 lbs She takes bentyl bid prn which does help somewhat.  Prior work-up for celiac negative. 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 There were also plans for capsule endoscopy, but I do not see where this was done. She was previously on protonix 40 mg qd but has stopped this.   She has chronic pelvic pain. She has increased pain and cramping with her menstrual cycle, which just started yesterday. She previously was on birth control for nine years and states her symptoms became worse when she stopped birth control but feels that the birth control was also causing a lot of her symptoms. She follows with gynecology and has also seen urogyn for her symptoms and is diagnosed with pelvic floor dysfunction and levator spasm.  From the description of her symptoms, endometriosis may also be contributing. She feels strongly about not trying birth control again because she wants a definitive diagnosis before starting treatment. She understands this would mean laparoscopy.   She is currently sexually active with a new partner. She came off of birth control last year. They are currently not using protection and using the pull out method. She states she was recently checked for STDs and gets checked yearly. Her menstrual  cycle just started yesterday. She has had no abnormal discharge prior to this.  Unable to see prior STD records.    Past Medical History:  Diagnosis Date  . Asthma   . Bronchitis   . Depression    Review of Systems:   Review of Systems  Constitutional: Positive for malaise/fatigue and weight loss.  HENT: Negative for sinus pain and sore throat.   Respiratory: Negative for cough and wheezing.   Gastrointestinal: Positive for constipation, diarrhea and heartburn.   Genitourinary: Positive for frequency. Negative for dysuria.  Musculoskeletal: Positive for back pain, joint pain and myalgias. Negative for falls.  Neurological: Positive for dizziness, tingling and headaches.  Psychiatric/Behavioral: Positive for depression. Negative for suicidal ideas. The patient is nervous/anxious.     Physical Exam:  Constitution: NAD, appears stated age HENT: [place]/AT, no thyroid swelling or masses, no cervical lymphadenopathy  Eyes: no icterus or injection Cardio: RRR, no m/r/g, no LE edema  Respiratory: CTA, no w/r/r Abdominal: NTTP, soft, non-distended, hyperactive BS  MSK: moving all extremities, no joint swelling or erythema, TTP over lower legs, joints  Neuro: normal affect, a&ox3 Skin: small amount of erythema under underwear and bra lining with excoriation at underwear line   Vitals:   07/29/20 0952  BP: 116/70  Pulse: 85  SpO2: 100%  Weight: 116 lb 8 oz (52.8 kg)    Assessment & Plan:   See Encounters Tab for problem based charting.  Patient discussed with Dr. Rebeca Alert

## 2020-07-30 DIAGNOSIS — F331 Major depressive disorder, recurrent, moderate: Secondary | ICD-10-CM | POA: Diagnosis not present

## 2020-07-30 DIAGNOSIS — F900 Attention-deficit hyperactivity disorder, predominantly inattentive type: Secondary | ICD-10-CM | POA: Diagnosis not present

## 2020-07-30 DIAGNOSIS — F410 Panic disorder [episodic paroxysmal anxiety] without agoraphobia: Secondary | ICD-10-CM | POA: Diagnosis not present

## 2020-07-30 DIAGNOSIS — F411 Generalized anxiety disorder: Secondary | ICD-10-CM | POA: Diagnosis not present

## 2020-08-02 DIAGNOSIS — K589 Irritable bowel syndrome without diarrhea: Secondary | ICD-10-CM | POA: Insufficient documentation

## 2020-08-02 DIAGNOSIS — L249 Irritant contact dermatitis, unspecified cause: Secondary | ICD-10-CM | POA: Insufficient documentation

## 2020-08-02 DIAGNOSIS — M6289 Other specified disorders of muscle: Secondary | ICD-10-CM | POA: Insufficient documentation

## 2020-08-02 DIAGNOSIS — N809 Endometriosis, unspecified: Secondary | ICD-10-CM | POA: Insufficient documentation

## 2020-08-02 DIAGNOSIS — Z3009 Encounter for other general counseling and advice on contraception: Secondary | ICD-10-CM | POA: Insufficient documentation

## 2020-08-02 NOTE — Assessment & Plan Note (Addendum)
She has had diffuse chronic pain for years in her back, joints and muscles. The pain moves around the body.Today her wrists and ankles are hurting more. She has sensitivity to cold and feels like she has poor ciruclation but no episodes of fingers turning white or numb with the cold. She does have chronic tingling in her lower extremities with pain in her lower back that seems to radiate down her legs. She sees PM&R and a psychiatrist for her pain currently. She states there has been a lot of previous work-up and the thought now is that she might have fibromyalgia. She has recently been referred to endocrinology and rheumatology as well. Her cymbalta was just increased from 20mg to 40 mg. Endocrinology at [facility] has ordered several labs for her which we are unable to see here. Her appointment with rheumatology is not until June, and she is hoping to see someone closer and sooner.  She was recently started on flexeril 5 mg tid prn and has tried it twice at night. She does feel like this has helped more than some other medications.  Prior ANA, ESR, and RF done in February were normal.  - cont. cymbalta 40 mg - assess improvement in symptoms with recent increase at follow-up  - cont. Flexeril tid prn   - referral placed to rheumatology with [facility] - she already has a referral in place with [facility] and this will allow her to stay closer to home.  - encouraged continued exercise  - will benefit from regular follow-up with a PCP  

## 2020-08-02 NOTE — Assessment & Plan Note (Signed)
She states she takes very hot showers and feels like her skin has been drier and itching more. There is an area over her hip and upper back that is itching more. The area of itching appears to be under her bra and just above her underwear line on her right lower back. There is well demarcated redness and with mild excoriation where she has been scratching. This is likely secondary to tighter clothing causing irritant dermatitis or an irritant from detergent.   - discussed using detergent for sensitive skin - sarna lotion prn  - f/u if symptoms worsen or fail to improve

## 2020-08-02 NOTE — Assessment & Plan Note (Signed)
She is currently sexually active with a new partner. She came off of birth control last year. They are currently not using protection and using the pull out method. She states she was recently checked for STDs and gets checked yearly. Her menstrual cycle just started yesterday. She has had no abnormal discharge prior to this.  Unable to see prior STD records.   - counseled on birth control

## 2020-08-02 NOTE — Assessment & Plan Note (Signed)
She has chronic pelvic pain. She has increased pain and cramping with her menstrual cycle, which just started yesterday. She previously was on birth control for nine years and states her symptoms became worse when she stopped birth control but feels that the birth control was also causing a lot of her symptoms. She follows with gynecology and has also seen urogyn for her symptoms and is diagnosed with pelvic floor dysfunction and levator spasm.  From the description of her symptoms, endometriosis may also be contributing. She feels strongly about not trying birth control again because she wants a definitive diagnosis before starting treatment. She understands this would mean laparoscopy.     - f/u with gyn  - f/u with pelvic floor physical therapy specialist

## 2020-08-02 NOTE — Assessment & Plan Note (Signed)
She has chronic abdominal pain, which she has had for many years, and is diagnosed with IBS, for which she has seen GI. She has both intermittent constipation and diarrhea. She has altered her diet to exclude dairy and gluten, which has somewhat helped and tries to eat a low inflammatory diet. Since these dietary changes last year she has lost about 40 lbs She takes bentyl bid prn which does help somewhat.  Prior work-up for celiac negative. 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 There were also plans for capsule endoscopy, but I do not see where this was done. She was previously on protonix 40 mg qd but has stopped this.   - continue bentyl qid prn - f/u with GI as needed

## 2020-08-03 LAB — LUPUS ANTICOAGULANT PANEL
APTT: 28.5 s
Anticardiolipin Ab, IgG: <10 [GPL'U]
Anticardiolipin Ab, IgM: 17 [MPL'U]
Beta-2 Glycoprotein I, IgA: <10 SAU
Beta-2 Glycoprotein I, IgG: <10 SGU
Beta-2 Glycoprotein I, IgM: <10 SMU
DRVVT Screen Seconds: 34.5 s
Hexagonal Phospholipid Neutral: 1 s
INR: 1.1 ratio
Platelet Neutralization: 0 s
Prothrombin Time: 11 s
Thrombin Time: 19 s

## 2020-08-05 ENCOUNTER — Ambulatory Visit: Payer: BC Managed Care – PPO | Admitting: Cardiology

## 2020-08-05 NOTE — Progress Notes (Signed)
Internal Medicine Clinic Attending  Case discussed with Dr. Seawell at the time of the visit.  We reviewed the resident's history and exam and pertinent patient test results.  I agree with the assessment, diagnosis, and plan of care documented in the resident's note.  Alexander Raines, M.D., Ph.D.  

## 2020-08-13 DIAGNOSIS — R278 Other lack of coordination: Secondary | ICD-10-CM | POA: Diagnosis not present

## 2020-08-13 DIAGNOSIS — M62838 Other muscle spasm: Secondary | ICD-10-CM | POA: Diagnosis not present

## 2020-08-13 DIAGNOSIS — R102 Pelvic and perineal pain: Secondary | ICD-10-CM | POA: Diagnosis not present

## 2020-08-16 ENCOUNTER — Encounter: Payer: BC Managed Care – PPO | Admitting: Physical Therapy

## 2020-08-17 DIAGNOSIS — F411 Generalized anxiety disorder: Secondary | ICD-10-CM | POA: Diagnosis not present

## 2020-08-18 DIAGNOSIS — L308 Other specified dermatitis: Secondary | ICD-10-CM | POA: Diagnosis not present

## 2020-08-18 DIAGNOSIS — L700 Acne vulgaris: Secondary | ICD-10-CM | POA: Diagnosis not present

## 2020-08-18 DIAGNOSIS — L650 Telogen effluvium: Secondary | ICD-10-CM | POA: Diagnosis not present

## 2020-08-25 ENCOUNTER — Encounter: Payer: BC Managed Care – PPO | Admitting: Physical Therapy

## 2020-08-25 DIAGNOSIS — M62838 Other muscle spasm: Secondary | ICD-10-CM | POA: Diagnosis not present

## 2020-08-25 DIAGNOSIS — R278 Other lack of coordination: Secondary | ICD-10-CM | POA: Diagnosis not present

## 2020-08-25 DIAGNOSIS — R102 Pelvic and perineal pain: Secondary | ICD-10-CM | POA: Diagnosis not present

## 2020-08-26 DIAGNOSIS — G8929 Other chronic pain: Secondary | ICD-10-CM | POA: Diagnosis not present

## 2020-08-26 DIAGNOSIS — R109 Unspecified abdominal pain: Secondary | ICD-10-CM | POA: Diagnosis not present

## 2020-08-26 DIAGNOSIS — I872 Venous insufficiency (chronic) (peripheral): Secondary | ICD-10-CM | POA: Insufficient documentation

## 2020-09-02 ENCOUNTER — Encounter: Payer: BC Managed Care – PPO | Admitting: Physical Therapy

## 2020-09-02 DIAGNOSIS — F411 Generalized anxiety disorder: Secondary | ICD-10-CM | POA: Diagnosis not present

## 2020-09-06 ENCOUNTER — Ambulatory Visit: Payer: BC Managed Care – PPO | Admitting: Internal Medicine

## 2020-09-08 DIAGNOSIS — F411 Generalized anxiety disorder: Secondary | ICD-10-CM | POA: Diagnosis not present

## 2020-09-09 ENCOUNTER — Encounter: Payer: BC Managed Care – PPO | Admitting: Physical Therapy

## 2020-09-09 DIAGNOSIS — M545 Low back pain, unspecified: Secondary | ICD-10-CM | POA: Diagnosis not present

## 2020-09-09 DIAGNOSIS — F410 Panic disorder [episodic paroxysmal anxiety] without agoraphobia: Secondary | ICD-10-CM | POA: Diagnosis not present

## 2020-09-09 DIAGNOSIS — F900 Attention-deficit hyperactivity disorder, predominantly inattentive type: Secondary | ICD-10-CM | POA: Diagnosis not present

## 2020-09-09 DIAGNOSIS — M542 Cervicalgia: Secondary | ICD-10-CM | POA: Diagnosis not present

## 2020-09-09 DIAGNOSIS — F331 Major depressive disorder, recurrent, moderate: Secondary | ICD-10-CM | POA: Diagnosis not present

## 2020-09-09 DIAGNOSIS — F411 Generalized anxiety disorder: Secondary | ICD-10-CM | POA: Diagnosis not present

## 2020-09-09 DIAGNOSIS — G8929 Other chronic pain: Secondary | ICD-10-CM | POA: Diagnosis not present

## 2020-09-15 ENCOUNTER — Encounter: Payer: Self-pay | Admitting: Internal Medicine

## 2020-09-15 DIAGNOSIS — N809 Endometriosis, unspecified: Secondary | ICD-10-CM

## 2020-09-15 DIAGNOSIS — M6289 Other specified disorders of muscle: Secondary | ICD-10-CM

## 2020-09-16 ENCOUNTER — Encounter: Payer: BC Managed Care – PPO | Admitting: Physical Therapy

## 2020-09-16 NOTE — Telephone Encounter (Signed)
I agree. Patient will need telehealth visit at least.

## 2020-09-17 DIAGNOSIS — G8929 Other chronic pain: Secondary | ICD-10-CM | POA: Diagnosis not present

## 2020-09-17 DIAGNOSIS — R109 Unspecified abdominal pain: Secondary | ICD-10-CM | POA: Diagnosis not present

## 2020-09-20 DIAGNOSIS — R102 Pelvic and perineal pain: Secondary | ICD-10-CM | POA: Diagnosis not present

## 2020-09-20 DIAGNOSIS — M62838 Other muscle spasm: Secondary | ICD-10-CM | POA: Diagnosis not present

## 2020-09-20 DIAGNOSIS — R278 Other lack of coordination: Secondary | ICD-10-CM | POA: Diagnosis not present

## 2020-09-21 ENCOUNTER — Ambulatory Visit (INDEPENDENT_AMBULATORY_CARE_PROVIDER_SITE_OTHER): Payer: BC Managed Care – PPO | Admitting: Student

## 2020-09-21 ENCOUNTER — Encounter: Payer: Self-pay | Admitting: Student

## 2020-09-21 ENCOUNTER — Other Ambulatory Visit: Payer: Self-pay

## 2020-09-21 VITALS — BP 111/87 | HR 85 | Temp 98.2°F | Ht 64.0 in | Wt 107.6 lb

## 2020-09-21 DIAGNOSIS — F332 Major depressive disorder, recurrent severe without psychotic features: Secondary | ICD-10-CM | POA: Diagnosis not present

## 2020-09-21 NOTE — Assessment & Plan Note (Addendum)
Patient with history of recurrent major depression. She follows with [facility] for outpatient psych, but states that her psychiatrist is transitioning to private practice and thus does not have any follow up scheduled at this time, but she is trying to find another physician. PHQ-9 today is 19.5 (placed 0.5 points for thoughts of harming herself). Patient has tried multiple medications in the past, including prozac, zoloft, wellbutrin, remeron, and cymbalta but all of these medications have adverse effects of horrible night sweats (wakes up 2-3 times from sleep drenched in sweat).  One of her primary complaints is chronic insomnia. She has tried melatonin and remeron in the past but with no relief. This is likely 2/2 undertreated depression and thus I expect it improve once her depressive symptoms are better managed. She is currently working night shifts as well, which is likely altering her normal circadian rhythm. Discussed implementing healthy sleep hygiene.   After discussing with patient, we agreed that it would be beneficial for her to see Dr. Monna Fam for further guidance and recommendation in terms of medication, seeing how she is unable to tolerate the typical antidepressants used. Given her concomitant ADHD and anxiety, I wanted to avoid starting any new medications that would interact with her current ones.  Plan: -Referral placed to [facility] for medication recommendation -f/u in 3 weeks

## 2020-09-21 NOTE — Progress Notes (Signed)
   CC: f/u for depression  HPI:  [name] is a 25 y.o. female with history listed below presenting to the [facility] for f/u for depression. Please see individualized problem based charting for full HPI.  Past Medical History:  Diagnosis Date  . Asthma   . Bronchitis   . Depression     Review of Systems:  Negative aside from that listed in individualized problem based charting.  Physical Exam:  Vitals:   09/21/20 0927  BP: 111/87  Pulse: 85  Temp: 98.2 F (36.8 C)  TempSrc: Oral  SpO2: 100%  Weight: 107 lb 9.6 oz (48.8 kg)  Height: 5\' 4"  (1.626 m)   Physical Exam Constitutional:      Comments: Thin, young female, NAD.  HENT:     Mouth/Throat:     Mouth: Mucous membranes are moist.     Pharynx: Oropharynx is clear. No oropharyngeal exudate.  Eyes:     Extraocular Movements: Extraocular movements intact.     Conjunctiva/sclera: Conjunctivae normal.     Pupils: Pupils are equal, round, and reactive to light.  Cardiovascular:     Rate and Rhythm: Normal rate and regular rhythm.     Pulses: Normal pulses.     Heart sounds: Normal heart sounds. No murmur heard. No friction rub. No gallop.   Pulmonary:     Effort: Pulmonary effort is normal.     Breath sounds: Normal breath sounds. No wheezing, rhonchi or rales.  Abdominal:     General: Bowel sounds are normal. There is no distension.     Palpations: Abdomen is soft.     Tenderness: There is no abdominal tenderness.  Musculoskeletal:        General: No swelling. Normal range of motion.     Cervical back: Normal range of motion.  Skin:    General: Skin is warm and dry.     Capillary Refill: Capillary refill takes less than 2 seconds.  Neurological:     General: No focal deficit present.     Mental Status: She is alert and oriented to person, place, and time.  Psychiatric:        Mood and Affect: Mood normal.        Behavior: Behavior normal.      Assessment & Plan:   See Encounters Tab for problem  based charting.  Patient discussed with Dr. 

## 2020-09-21 NOTE — Progress Notes (Signed)
Internal Medicine Clinic Attending  Case discussed with Dr. Jinwala  At the time of the visit.  We reviewed the resident's history and exam and pertinent patient test results.  I agree with the assessment, diagnosis, and plan of care documented in the resident's note.  

## 2020-09-21 NOTE — Patient Instructions (Signed)
Brianna [name],  It was a pleasure seeing you in the clinic today.   1. I have placed a referral to [facility], Dr. Monna Fam, for further evaluation to help control your depressive symptoms. Someone will call to help set this up for you.  Please call our clinic at [phone] if you have any questions or concerns. The best time to call is Monday-Friday from 9am-4pm, but there is someone available 24/7 at the same number. If you need medication refills, please notify your pharmacy one week in advance and they will send [place] a request.   Thank you for letting us take part in your care. We look forward to seeing you next time!

## 2020-09-22 ENCOUNTER — Ambulatory Visit: Payer: BC Managed Care – PPO | Admitting: Behavioral Health

## 2020-09-22 ENCOUNTER — Other Ambulatory Visit: Payer: Self-pay

## 2020-09-22 ENCOUNTER — Telehealth: Payer: Self-pay

## 2020-09-22 DIAGNOSIS — F331 Major depressive disorder, recurrent, moderate: Secondary | ICD-10-CM

## 2020-09-22 DIAGNOSIS — F5089 Other specified eating disorder: Secondary | ICD-10-CM

## 2020-09-22 DIAGNOSIS — F419 Anxiety disorder, unspecified: Secondary | ICD-10-CM

## 2020-09-22 DIAGNOSIS — F411 Generalized anxiety disorder: Secondary | ICD-10-CM | POA: Diagnosis not present

## 2020-09-22 NOTE — Telephone Encounter (Signed)
Is requesting a call back she stated that she was to speak to the dr yesterday about a referral to  Pelvic PT  But she forgot

## 2020-09-23 ENCOUNTER — Encounter: Payer: BC Managed Care – PPO | Admitting: Physical Therapy

## 2020-09-23 NOTE — Telephone Encounter (Signed)
Pt called again today about the same referral I explained that when the dr gets back to the nurse she will call her back ... pt is also now wanting a work note  Stating that she can only work three days instead of four her reason is that her disability has gotten worse in the last week

## 2020-09-24 NOTE — BH Specialist Note (Signed)
Integrated Behavioral Health via Telemedicine Visit  09/24/2020 Brianna [name] [id number]  Number of Integrated Behavioral Health visits: 1/6 Session Start time: 12:00pm  Session End time: 12:50pm Total time: 50   Referring Provider: Dr. Merrilyn Puma, MD Patient/Family location: Pt is home in private [facility] Provider location: Working remotely in private All persons participating in visit: Pt & Clinician Types of Service: Individual psychotherapy  I connected with [name] and/or [name]'s self via  Telephone or Engineer, civil (consulting)  (Video is Surveyor, mining) and verified that I am speaking with the correct person using two identifiers. Discussed confidentiality: Yes   I discussed the limitations of telemedicine and the availability of in person appointments.  Discussed there is a possibility of technology failure and discussed alternative modes of communication if that failure occurs.  I discussed that engaging in this telemedicine visit, they consent to the provision of behavioral healthcare and the services will be billed under their insurance.  Patient and/or legal guardian expressed understanding and consented to Telemedicine visit: Yes   Presenting Concerns: Patient and/or family reports the following symptoms/concerns: elevated anx, trouble initiating & maintaining sleep due to night sweats, weight loss due to eating issues, ADHD-Inattentive type per Pt report, & need for med mgmt w/Psychi Referral Duration of problem: Pt has had GI issues since Sum 2021, Pt has felt elevated stress since the time she was 25yo, Pt has been Dx with Pelvic Floor Dysfunction for which she has undergone pelvic floor PT that was, "100% helpful"; &Pt related eating issues that include binging w/o purging beh; more recently restriction of gluten & dairy-Pt is 5'4" & weighs 107#. Pt having relational issues that span into the work envir, although she  keeps things professional Severity of problem: moderate trending severe  Pt has monthly appt w/NP for her psychiatric medication needs & also for her birth control  Patient and/or Family's Strengths/Protective Factors: Social connections, Social and Emotional competence, Concrete supports in place (healthy food, safe environments, etc.), Sense of purpose and Physical Health (exercise, healthy diet, medication compliance, etc.)  Goals Addressed: Patient will: 1.  Reduce symptoms of: anxiety, depression and stress  2.  Increase knowledge and/or ability of: coping skills, healthy habits and stress reduction  3.  Demonstrate ability to: Increase healthy adjustment to current life circumstances and assist Pt to process through Hx of events, health issues that are causing her concern  Progress towards Goals: Estb'd today; Pt will f/u with next scheduled visit & proceed w/necessary Referrals as suggested today  Interventions: Interventions utilized:  Solution-Focused Strategies, Behavioral Activation, Supportive Counseling and Sleep Hygiene Standardized Assessments completed: f/u with necessary screeners prn  Patient and/or Family Response: Pt receptive to call today & appreciative of care/concern by Clinician. Pt agrees w/Referrals & we will proceed w/this POC.  Assessment: Patient currently experiencing elevated Sx of anx/dep & adjustment to reproductive issues she has managed in the past 2 wks, her concerns for eating & digestive health, her Hx of stress/anx & her Hx of ADHD Tx since [facility]. Pt sees a Psychiatric NP once monthly & cont's to do so for medication needs.  Pt is currently wanting to hit the reset button & promote her Px & mtl health in all ways.   Patient may benefit from cont'd psychotherapy services to augment medications & Tx protocol. Pt is consistent w/her healthcare.  Plan: 1. Follow up with behavioral health clinician on : 2-3 wks on telehealth for 60 min 2. Behavioral  recommendations: Pt can  journal or keep notebook of concerns btwn visits to focus our sessions. Pt Referral to [facility] in the [place] for her psychotherapy needs. Collaborated w/Pt re: her anx & the connection to her eating issues in the past & currently. Referral suggested for Nutritionist who is also versed in disordered eating issues; Dr. Wyona Almas, PhD. 3. Referral(s): Integrated [facility]In Clinic) and Nutritionist Wyona Almas, PhD & [facility] for [place] psychotherapy services  I discussed the assessment and treatment plan with the patient and/or parent/guardian. They were provided an opportunity to ask questions and all were answered. They agreed with the plan and demonstrated an understanding of the instructions.   They were advised to call back or seek an in-person evaluation if the symptoms worsen or if the condition fails to improve as anticipated.  [name], LMFT

## 2020-09-27 NOTE — Addendum Note (Signed)
Addended by: Claudean Severance on: 09/27/2020 09:57 AM   Modules accepted: Orders

## 2020-09-28 ENCOUNTER — Encounter
Payer: BC Managed Care – PPO | Attending: Physical Medicine and Rehabilitation | Admitting: Physical Medicine and Rehabilitation

## 2020-09-28 ENCOUNTER — Encounter: Payer: Self-pay | Admitting: Physical Medicine and Rehabilitation

## 2020-09-28 ENCOUNTER — Other Ambulatory Visit: Payer: Self-pay

## 2020-09-28 ENCOUNTER — Telehealth: Payer: Self-pay | Admitting: Behavioral Health

## 2020-09-28 VITALS — BP 138/87 | HR 92 | Temp 98.5°F | Ht 64.0 in | Wt 107.8 lb

## 2020-09-28 DIAGNOSIS — E278 Other specified disorders of adrenal gland: Secondary | ICD-10-CM | POA: Diagnosis not present

## 2020-09-28 DIAGNOSIS — Z13220 Encounter for screening for lipoid disorders: Secondary | ICD-10-CM

## 2020-09-28 DIAGNOSIS — G4701 Insomnia due to medical condition: Secondary | ICD-10-CM | POA: Insufficient documentation

## 2020-09-28 DIAGNOSIS — R5382 Chronic fatigue, unspecified: Secondary | ICD-10-CM | POA: Insufficient documentation

## 2020-09-28 DIAGNOSIS — K30 Functional dyspepsia: Secondary | ICD-10-CM | POA: Insufficient documentation

## 2020-09-28 DIAGNOSIS — E349 Endocrine disorder, unspecified: Secondary | ICD-10-CM

## 2020-09-28 DIAGNOSIS — Z131 Encounter for screening for diabetes mellitus: Secondary | ICD-10-CM

## 2020-09-28 DIAGNOSIS — M5416 Radiculopathy, lumbar region: Secondary | ICD-10-CM | POA: Insufficient documentation

## 2020-09-28 DIAGNOSIS — Z1321 Encounter for screening for nutritional disorder: Secondary | ICD-10-CM

## 2020-09-28 DIAGNOSIS — M6289 Other specified disorders of muscle: Secondary | ICD-10-CM | POA: Insufficient documentation

## 2020-09-28 MED ORDER — AMITRIPTYLINE HCL 10 MG PO TABS: 10.0000 mg | ORAL_TABLET | Freq: Every day | ORAL | 1 refills | Status: DC

## 2020-09-28 NOTE — Progress Notes (Addendum)
Subjective:    [name], female    DOB: 1995/12/11, 25 y.o.   MRN: [id number]  HPI   Brianna [name] is a 25 year old woman who presents to establish care for chronic nerve pain.  1) Chronic nerve pain:  -Pain on average is 6/10 -Pain right now is 3/10 -She uses a heating pad. Alternates ice for short times -She has occasional flare-ups concentrated around her mid-section.  -She is currently on Cymbalta which can help with nerve pain.  -she was started on Gabapentin and felt double vision and tingling on lips.   2) Pelvic floor dysfunction: -She is starting with a new therapist soon.  -Had a pelvic MRI at [facility] and reviewed this with her and was negative.   3) GI symptoms -Struggled with these since she was 25 years old.   4) History of multiple suicidal ideations  5) Spasms: -present throughout her body -her chriopractor may start her on a muscle relaxer.   6) Depression:  -suffered night sweats from Cymbalta.   7) Insomnia:  -sleep has been very poor for a year.  -her job ends at 10:30.   8) Anxiety: -she takes clonazepam as needed.  -has been working long hours at night without breaks -received a note saying she can only do 4 days per week.  9) ADHD: has not found much improvement in focus with Ritalin.    Pain Inventory Average Pain 6 Pain Right Now 6 My pain is constant, sharp and aching  In the last 24 hours, has pain interfered with the following? General activity 8 Relation with others 8 Enjoyment of life 10 What TIME of day is your pain at its worst? daytime and evening Sleep (in general) Poor  Pain is worse with: walking, bending, sitting and inactivity Pain improves with: heat/ice and therapy/exercise Relief from Meds: 0      Family History  Problem Relation Age of Onset  . Diabetes Mother   . Heart disease Paternal Uncle    Social History   Socioeconomic History  . Marital status: Single    Spouse name: Not  on file  . Number of children: Not on file  . Years of education: Not on file  . Highest education level: Not on file  Occupational History  . Not on file  Tobacco Use  . Smoking status: Former Smoker    Types: E-cigarettes  . Smokeless tobacco: Never Used  Substance and Sexual Activity  . Alcohol use: Yes    Alcohol/week: 20.0 standard drinks    Types: 8 Glasses of wine, 12 Cans of beer per week  . Drug use: No  . Sexual activity: Not on file  Other Topics Concern  . Not on file  Social History Narrative  . Not on file   Social Determinants of Health   Financial Resource Strain: Not on file  Food Insecurity: Not on file  Transportation Needs: Not on file  Physical Activity: Not on file  Stress: Not on file  Social Connections: Not on file   Past Surgical History:  Procedure Laterality Date  . WISDOM TOOTH EXTRACTION     Past Medical History:  Diagnosis Date  . Asthma   . Bronchitis   . Depression    There were no vitals taken for this visit.  Opioid Risk Score:   Fall Risk Score:  `1  Depression screen PHQ 2/9  Depression screen [facility] 2/9 09/21/2020 07/29/2020 07/09/2020 06/28/2020  Decreased Interest 0 2 3  1  Down, Depressed, Hopeless 1 2 3 2   PHQ - 2 Score 1 4 6 3   Altered sleeping 3 3 3 3   Tired, decreased energy 3 3 3 3   Change in appetite 3 3 3 3   Feeling bad or failure about yourself  2 3 3 3   Trouble concentrating 3 3 3 3   Moving slowly or fidgety/restless 2 3 3 3   Suicidal thoughts 1 1 1 3   PHQ-9 Score 18 23 25 24   Difficult doing work/chores Extremely dIfficult Very difficult Extremely dIfficult -    Review of Systems  Constitutional: Negative for diaphoresis and unexpected weight change.  HENT: Negative.   Eyes: Negative.   Respiratory: Negative for shortness of breath.   Cardiovascular: Negative for leg swelling.  Gastrointestinal: Negative for abdominal pain, constipation, diarrhea and nausea.  Endocrine: Negative.   Genitourinary: Positive  for pelvic pain.       Bladder control  Musculoskeletal: Positive for back pain and myalgias.  Skin: Negative.   Allergic/Immunologic: Negative.   Neurological: Negative for dizziness and weakness.       Tingling  Hematological: Does not bruise/bleed easily.  Psychiatric/Behavioral: Negative for confusion, dysphoric mood and suicidal ideas. The patient is not nervous/anxious.   All other systems reviewed and are negative.      Objective:   Physical Exam Gen: no distress, normal appearing HEENT: oral mucosa pink and moist, NCAT Cardio: Reg rate Chest: normal effort, normal rate of breathing Abd: soft, non-distended Ext: no edema Psych: pleasant, normal affect, +depression and anxiety.  Skin: intact Neuro: Alert and oriented x3 Musculoskeletal: Diffuse tender points throughout the body on both side. Trigger points throughout     Assessment & Plan:  1) Chronic Pain Syndrome secondary to fibromyalgia -Discussed current symptoms of pain and history of pain.  -Discussed benefits of exercise in reducing pain. -Apply emu oil. -discussed benefits of minimizing medications.  -Lupus panel ordered.  -RA and ANA reviewed and negative.  -Discussed trigger point injections -Discussed following foods that may reduce pain: 1) Ginger 2) Blueberries 3) Salmon 4) Pumpkin seeds 5) dark chocolate 6) turmeric 7) tart cherries 8) virgin olive oil 9) chilli peppers 10) mint  Link to further information on diet for chronic pain:  2) Pelvic floor dysfunction: -continue pelvic floor therapy.  -estrogen, testosterone,  level  3) Depression: -she has been going to therapy twice per week. -had negative response to Cymbalta (night sweats).   4) Functional dyspepsia: -refer to gastroenterology for gastric emptying study.   5) contraception: -started a NuvaRing a couple of days ago.   6) pain in  lower back and legs: -stop Gabapentin  7) insomnia: -prescribed amitriptyline 10mg , discussed side effects,

## 2020-09-28 NOTE — Telephone Encounter (Signed)
Patient has an appt with PCP on 5/23.

## 2020-09-28 NOTE — Telephone Encounter (Signed)
Contacted Pt re: questions from Initial Visit on 09/22/20. Responded to Pt concerns & alerted her to Progress Note information availability.  Dr. Monna Fam

## 2020-09-28 NOTE — Patient Instructions (Addendum)
[employer]cast: #[id number]

## 2020-09-29 DIAGNOSIS — R102 Pelvic and perineal pain: Secondary | ICD-10-CM | POA: Diagnosis not present

## 2020-09-29 DIAGNOSIS — R278 Other lack of coordination: Secondary | ICD-10-CM | POA: Diagnosis not present

## 2020-09-29 DIAGNOSIS — M62838 Other muscle spasm: Secondary | ICD-10-CM | POA: Diagnosis not present

## 2020-09-29 DIAGNOSIS — F411 Generalized anxiety disorder: Secondary | ICD-10-CM | POA: Diagnosis not present

## 2020-09-29 NOTE — Addendum Note (Signed)
Addended by: Horton Chin on: 09/29/2020 12:34 PM   Modules accepted: Orders

## 2020-09-30 DIAGNOSIS — M6289 Other specified disorders of muscle: Secondary | ICD-10-CM | POA: Diagnosis not present

## 2020-09-30 DIAGNOSIS — L218 Other seborrheic dermatitis: Secondary | ICD-10-CM | POA: Diagnosis not present

## 2020-09-30 DIAGNOSIS — L858 Other specified epidermal thickening: Secondary | ICD-10-CM | POA: Diagnosis not present

## 2020-09-30 DIAGNOSIS — R5382 Chronic fatigue, unspecified: Secondary | ICD-10-CM | POA: Diagnosis not present

## 2020-10-04 ENCOUNTER — Telehealth: Payer: Self-pay | Admitting: Gastroenterology

## 2020-10-04 DIAGNOSIS — M25551 Pain in right hip: Secondary | ICD-10-CM | POA: Diagnosis not present

## 2020-10-04 NOTE — Telephone Encounter (Signed)
Hi Dr. Myrtie Neither,  We received a referral for patient to be seen for functional Dysphasia gastric emptying test. She is a current patient over at [facility]. Patient said she would like to transfer her care over to be under [facility] providers.   Records are in Epic for you to review and advise on scheduling.   Thank you

## 2020-10-04 NOTE — Telephone Encounter (Signed)
Thank you for the note.  Please inform this patient that due to current practice schedule and waiting time for new referrals, I am presently not excepting transfers of care from local GI practices.  - HD

## 2020-10-05 ENCOUNTER — Telehealth: Payer: Self-pay

## 2020-10-05 ENCOUNTER — Other Ambulatory Visit: Payer: Self-pay

## 2020-10-05 ENCOUNTER — Ambulatory Visit: Payer: BC Managed Care – PPO | Admitting: Behavioral Health

## 2020-10-05 DIAGNOSIS — F332 Major depressive disorder, recurrent severe without psychotic features: Secondary | ICD-10-CM

## 2020-10-05 DIAGNOSIS — F5089 Other specified eating disorder: Secondary | ICD-10-CM

## 2020-10-05 DIAGNOSIS — F419 Anxiety disorder, unspecified: Secondary | ICD-10-CM

## 2020-10-05 DIAGNOSIS — F411 Generalized anxiety disorder: Secondary | ICD-10-CM | POA: Diagnosis not present

## 2020-10-05 NOTE — Telephone Encounter (Signed)
Called pt - stated she has a mental health issue and wanted to know if she can see Dr Monna Fam sooner than 5/26; she would like to see Dr Monna Fam today or tomorrow. No available appts. Chat message sent to Dr Monna Fam.  Dr Monna Fam stated she can see pt tomorrow at 12noon. Pt stated she's unable d/t another appt. And stated just before I had called her, she talked to her main therapist at [facility] here in [place] for has an appt @ 1030 Am. And she will keep her appt with Dr Monna Fam on the 26th.

## 2020-10-05 NOTE — Telephone Encounter (Signed)
Pt is requesting a call back .. she stated that she would like to push her three week f/u with her PCP up   Due to and emergency

## 2020-10-06 DIAGNOSIS — R202 Paresthesia of skin: Secondary | ICD-10-CM | POA: Insufficient documentation

## 2020-10-06 DIAGNOSIS — R109 Unspecified abdominal pain: Secondary | ICD-10-CM | POA: Diagnosis not present

## 2020-10-06 DIAGNOSIS — G8929 Other chronic pain: Secondary | ICD-10-CM | POA: Insufficient documentation

## 2020-10-06 DIAGNOSIS — R102 Pelvic and perineal pain: Secondary | ICD-10-CM | POA: Diagnosis not present

## 2020-10-06 DIAGNOSIS — M791 Myalgia, unspecified site: Secondary | ICD-10-CM | POA: Diagnosis not present

## 2020-10-06 NOTE — BH Specialist Note (Signed)
Integrated Behavioral Health via Telemedicine Visit  10/06/2020 Brianna [name] [id number]  Number of Integrated Behavioral Health visits: 2/6 Session Start time: 12:00pm  Session End time: 12:45pm Total time: 45   Referring Provider: Dr. Merrilyn Puma, MD Patient/Family location: Pt at home in private [facility] Provider location: [facility] Office All persons participating in visit: Pt & Clinician Types of Service: Individual psychotherapy and Health Promotion  I connected with Brianna [name] and/or Brianna [name]'s self via  Telephone or [employer]  (Video is Surveyor, mining) and verified that I am speaking with the correct person using two identifiers. Discussed confidentiality: Yes   I discussed the limitations of telemedicine and the availability of in person appointments.  Discussed there is a possibility of technology failure and discussed alternative modes of communication if that failure occurs.  I discussed that engaging in this telemedicine visit, they consent to the provision of behavioral healthcare and the services will be billed under their insurance.  Patient and/or legal guardian expressed understanding and consented to Telemedicine visit: Yes   Presenting Concerns: Patient and/or family reports the following symptoms/concerns: Pt sts she is sleep deprived, emot'l, & exp'g pain in her neck, shoulder area, R hip, & pelvic area. Pt is tearful on the phone today & grateful for the call bc she cannot see her regular [facility] @ [facility] this week due to an appt at [facility] w/Dr. Alene Mires, MD.   Sleep is easily initiated, but Pt cannot maintain & wakes up after an hour. Pt has spasms in her sleep that also awaken her. Pt reports she is exp'g anxiety that is intense & "comes in waves."  Pt has thought about Clinician's discussion about disordered eating & agrees this is probably an issue. Referral to Dr. Wyona Almas, PhD @ [facility]  is in process. Eating is frustrating bc Pt will be hungry & then c/o assoc'd "hurting" when she tries to satiate her appetite. Pt sts she had a Referral to [facility], but no appt availability until Sept.  Duration of problem: over the course of months & now worsening; Severity of problem: moderate trending severe  Patient and/or Family's Strengths/Protective Factors: Social connections, Social and Emotional competence and Concrete supports in place (healthy food, safe environments, etc.)  Goals Addressed: Patient will: 1.  Reduce symptoms of: anxiety, depression, stress and some mood lability  2.  Increase knowledge and/or ability of: coping skills and stress reduction, sleep hygiene suggestions offered  3.  Demonstrate ability to: Increase healthy adjustment to current life circumstances, Increase adequate support systems for patient/family and inc         Pt efforts w/sleep hygiene to improve emot'l regulation status Progress towards Goals: Ongoing  Interventions: Interventions utilized:  Solution-Focused Strategies, Supportive Counseling, Sleep Hygiene and Preventative Services/Health Promotion Standardized Assessments completed: f/u with appropriate screeners prn  Patient and/or Family Response: Pt receptive to call today & close f/u this week via telehealth  Assessment: Patient currently experiencing inc in Sx for insomnia, anxiety, psychological overwhelm, & adjustment to work circumstances where Mgmt exhibits dec'd understanding.  Pt has appt @ [facility] w/Gastroenterologist Dr. Alene Mires, MD on Wed May 18th in the afternoon.   Pt graduated from [employer] in 2020. Pt is frustrated w/the vicious cycle of gaining exp & also finding a job in her field.   Patient may benefit from cont'd encouragement & support for changes in health status issues. Pt sts she has little support & Parents are not present for her  in this way. Pt is a diligent, hard-working indiv & needs to know her worth  fully.  Plan: 1. Follow up with behavioral health clinician on : Brianna [name], May 19th btwn 12-1pm 2. Behavioral recommendations: Use the sleep hygiene suggestions offered & see if this impacts quality of sleep. Clinician will mail the [facility]BT[facility] Pkt to Pt for keeping a Sleep Diary & use of info for psychoedu. 3. Referral(s): Dr. Wyona Almas, PhD @ [facility]  I discussed the assessment and treatment plan with the patient and/or parent/guardian. They were provided an opportunity to ask questions and all were answered. They agreed with the plan and demonstrated an understanding of the instructions.   They were advised to call back or seek an in-person evaluation if the symptoms worsen or if the condition fails to improve as anticipated.  Brianna Lever, LMFT

## 2020-10-07 ENCOUNTER — Encounter: Payer: Self-pay | Admitting: Physical Therapy

## 2020-10-07 ENCOUNTER — Ambulatory Visit: Payer: BC Managed Care – PPO | Admitting: Behavioral Health

## 2020-10-07 ENCOUNTER — Ambulatory Visit: Payer: BC Managed Care – PPO | Attending: Internal Medicine | Admitting: Physical Therapy

## 2020-10-07 ENCOUNTER — Other Ambulatory Visit: Payer: Self-pay

## 2020-10-07 ENCOUNTER — Telehealth: Payer: Self-pay | Admitting: Behavioral Health

## 2020-10-07 DIAGNOSIS — R41841 Cognitive communication deficit: Secondary | ICD-10-CM | POA: Diagnosis not present

## 2020-10-07 DIAGNOSIS — R278 Other lack of coordination: Secondary | ICD-10-CM | POA: Insufficient documentation

## 2020-10-07 DIAGNOSIS — M6281 Muscle weakness (generalized): Secondary | ICD-10-CM | POA: Insufficient documentation

## 2020-10-07 DIAGNOSIS — R252 Cramp and spasm: Secondary | ICD-10-CM | POA: Diagnosis not present

## 2020-10-07 NOTE — Therapy (Signed)
[facility] [address], [address] [place], [place], [address] Phone: [phone]   Fax:  [phone]  Physical Therapy Evaluation  Patient Details  Name: Brianna [name] MRN: [id number] Date of Birth: [birth year] Referring Provider (PT): Dr. Miguel AschoffJulie Anne Williams   Encounter Date: 10/07/2020   PT End of Session - 10/07/20 1358    Visit Number 1    Date for PT Re-Evaluation 02/07/21    Authorization Type BCBS    PT Start Time 1100    PT Stop Time 1145    PT Time Calculation (min) 45 min    Activity Tolerance Patient tolerated treatment well;Patient limited by pain    Behavior [facility] for tasks assessed/performed           Past Medical History:  Diagnosis Date  . Asthma   . Bronchitis   . Depression     Past Surgical History:  Procedure Laterality Date  . WISDOM TOOTH EXTRACTION      There were no vitals filed for this visit.    Subjective Assessment - 10/07/20 1111    Subjective I just go the extra small dilator that is x small. Patient is having muscular pain. Spasms in the thighs that shoot to the bottom of the foot. Past month the muscular pain has gone into the bone and right hip has increased constant pain.    Patient Stated Goals reduce pain    Currently in Pain? Yes    Pain Score 5     Pain Location Hip    Pain Orientation Right;Left    Pain Descriptors / Indicators Aching;Constant    Pain Type Chronic pain    Pain Onset More than a month ago    Pain Frequency Constant    Aggravating Factors  walking, sleeping on side, sitting for too long, driving    Pain Relieving Factors hot pack    Multiple Pain Sites Yes    Pain Score 7    Pain Location Back    Pain Orientation Lower    Pain Descriptors / Indicators Aching;Tightness    Pain Type Chronic pain    Pain Onset More than a month ago    Pain Frequency Constant    Aggravating Factors  sitting and lean forward, sitting and walking for  too long, lifting    Pain Relieving Factors heat    Pain Score 9    Pain Location Pelvis    Pain Orientation Right;Left    Pain Descriptors / Indicators Shooting;Stabbing    Pain Type Chronic pain    Pain Onset More than a month ago    Pain Frequency Intermittent    Aggravating Factors  not sure    Pain Relieving Factors heat              [facility] PT Assessment - 10/07/20 0001      Assessment   Medical Diagnosis M62.89 Pelvic floor dysfunction; N80.9 Endometriosis    Referring Provider (PT) Dr. Miguel AschoffJulie Anne Williams    Onset Date/Surgical Date --   last year   Prior Therapy yes, Pelvic      Precautions   Precautions None      Restrictions   Weight Bearing Restrictions No      Home Environment   Living Environment Private residence      Prior Function   Level of Independence Independent    Vocation Full time employment    Vocation Requirements standing, lifting, hospitality    Leisure  yoga daily      Cognition   Overall Cognitive Status Within Functional Limits for tasks assessed      Posture/Postural Control   Posture/Postural Control No significant limitations      ROM / Strength   AROM / PROM / Strength AROM;Strength      AROM   Lumbar Extension decreased by 25%      PROM   Right Hip External Rotation  55    Left Hip External Rotation  45      Strength   Right Hip Flexion 4+/5    Right Hip ABduction 4/5    Right Hip ADduction 5/5    Left Hip Flexion 4+/5    Left Hip ABduction 4/5    Left Hip ADduction 5/5      Palpation   Spinal mobility PA movement of T10-L5 decreased    SI assessment  ASIS are equal; rotated to the left    Palpation comment tenderness located in suprapubic area, along the gluteal, levator ani, and ischiocavernosus                      Objective measurements completed on examination: See above findings.     Pelvic Floor Special Questions - 10/07/20 0001    Urinary Leakage No    Urinary urgency Yes    Urinary  frequency does not empty her bladder all the way; goes to the bathroom every hour; voids 1-2 times per night; able to initiate a urine stream    Fecal incontinence No    Falling out feeling (prolapse) Yes    Activities that cause feeling of prolapse increased activity at work    Skin Integrity Intact    External Palpation ischiocavernosus    Pelvic Floor Internal Exam Patient confirms identification and approves PT to assess pelvic floor and treatment    Exam Type Vaginal    Palpation tender in iliococcygeus, puborectalis, perineal body, obturator internist bulbocavernosis    Strength weak squeeze, no lift                      PT Short Term Goals - 10/07/20 1356      PT SHORT TERM GOAL #1   Title patient is independent with initial HEP    Time 4    Period Weeks    Status On-going    Target Date 11/04/20      PT SHORT TERM GOAL #2   Title patient is able to use her dilator/wand correctly to reduce trigger points    Time 4    Period Weeks    Status New    Target Date 11/04/20      PT SHORT TERM GOAL #3   Title patient is able to perform diaphragmatic breathing to bulge the pelvic floor    Time 4    Period Weeks    Status New    Target Date 11/04/20      PT SHORT TERM GOAL #4   Title patient understands ways to calm the nervous system to reduce her pain    Time 4    Period Weeks    Status New    Target Date 11/04/20      PT SHORT TERM GOAL #5   Title patient reports her pain intensity is reduced 25% of the day    Time 4    Period Weeks    Status New    Target Date 11/04/20  PT Long Term Goals - 10/07/20 1357      PT LONG TERM GOAL #1   Title Patient is independent with advanced HEP    Time 4    Period Months    Status New    Target Date 02/07/21      PT LONG TERM GOAL #2   Title pelvic pain decreased </= 3/10 50-60% of the time due to reduction of trigger points    Time 4    Period Months    Status New    Target Date 02/07/21       PT LONG TERM GOAL #3   Title patient is able to fully empty her bowels due to improve pelvic floor coordination during a bowel movement    Time 4    Period Months    Status New    Target Date 02/07/21      PT LONG TERM GOAL #4   Title patient is able to fully empty her bladder and is able to wait 2-3 hours to urinate due to relaxation of the pelvic floor    Time 4    Period Months    Status New    Target Date 02/07/21                  Plan - 10/07/20 1359    Clinical Impression Statement Patient is a 25 year old female with pelvic pain for that has gotten worse in the past year. . Patient is seeing a counsler for this. Patient reports her pelvic pain ranges from a 3/10-10/10. It is located in the abdominal and pelvic region. Sometimes she will have neuroapthy in her feet and shooting pains in her legs. Patient will have the urge to have a bowel movement in the morning but unable to completely empty her bowels. Patient will strain to have a bowel movement at times. Patient has urinary urgency and will urinate every hour. Patient feels heaviness in the pelvic area especially when she is busy at work. Patient reports to the therapist she feels her bones are sticking out more and not sure why.  Pelvic floor strength is 2/5 posteriorly and 0/5 anteriorly. She has tenderness and tightness in bilateral bulbocavernosus, pubococcygeus, and iliococcygeus. She does  have tenderness in the lower abdominal area. Bilateral hip weakness and some core weakness. Patient presently takes muscle relaxers orally for the pelvic floor spasms. Patient would benefit from skilled therapy to improve pelvic floor coordination and strength while reducing her pain to improve daily life tasks.    Personal Factors and Comorbidities Time since onset of injury/illness/exacerbation;Finances;Sex;Profession;Past/Current Experience    Examination-Activity Limitations Locomotion Level;Toileting;Stand     Examination-Participation Restrictions Occupation;Community Activity;Interpersonal Relationship    Rehab Potential Excellent    PT Frequency 1x / week    PT Duration Other (comment)   4 months   PT Treatment/Interventions ADLs/Self Care Home Management;Biofeedback;Cryotherapy;Electrical Stimulation;Iontophoresis 4mg /ml Dexamethasone;Moist Heat;Ultrasound;Neuromuscular re-education;Therapeutic exercise;Therapeutic activities;Patient/family education;Manual techniques;Dry needling;Taping;Spinal Manipulations;Joint Manipulations    PT Next Visit Plan diaphragmatic breathing wot bulge the pelvic floor; fascial release in the 3 diaphragms; ways to calm the nervous system; manual work to abdomina, and thighs; manual work internally if tolerate    Consulted and Agree with Plan of Care Patient           Patient will benefit from skilled therapeutic intervention in order to improve the following deficits and impairments:  Decreased coordination,Increased fascial restricitons,Decreased endurance,Decreased activity tolerance,Dizziness,Pain,Decreased strength  Visit Diagnosis: Muscle weakness (generalized) - Plan: PT  plan of care cert/re-cert  Cramp and spasm - Plan: PT plan of care cert/re-cert  Other lack of coordination - Plan: PT plan of care cert/re-cert  Cognitive communication deficit - Plan: PT plan of care cert/re-cert     Problem List Patient Active Problem List   Diagnosis Date Noted  . IBS (irritable bowel syndrome) 08/02/2020  . Pelvic floor dysfunction 08/02/2020  . Endometriosis 08/02/2020  . Irritant dermatitis 08/02/2020  . Birth control counseling 08/02/2020  . Gastroesophageal reflux disease 06/28/2020  . Severe recurrent major depression without psychotic features (HCC) 06/28/2020  . Multiple joint pain 06/28/2020  . 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 Tachycardia 06/28/2020  . History of COVID-19 06/28/2020  . COVID-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  manifesting chronic concentration deficit 06/28/2020  . Atypical chest pain 06/22/2020  . Dizziness 06/22/2020  . SOB (shortness of breath) 06/22/2020  . Palpitations 06/14/2020  . Anxiety 05/04/2020  . Internal and external hemorrhoids without complication 04/29/2020  . Pelvic prolapse 02/20/2020  . Acute recurrent pansinusitis 10/21/2019  . ETD (Eustachian tube dysfunction), bilateral 10/21/2019  . Cat allergy due to both airborne and skin contact 05/01/2016  . Chronic sore throat 05/01/2016  . Acne vulgaris 03/11/2014  . Mild intermittent asthma without complication 03/11/2014  . Seasonal allergic rhinitis 03/11/2014  . Seasonal allergies 03/11/2014    Eulis Foster, PT 10/07/20 4:02 PM   [facility] [address] [place], [place], [address] Phone: [phone]   Fax:  [phone]  Name: Vernon Ariel MRN: [id number] Date of Birth: [birth year]

## 2020-10-07 NOTE — Telephone Encounter (Signed)
Spoke for ~ 20 min w/Pt to f/u on her recent appt w/Dr. Remer Macho, MD of [facility]. Pt completed labs for genetic sequencing testing to r/o Fabry's Dis. Pt is distraught over the myriad of health issues occurring for her @ this time. She is esp'ly concerned for her rapid hair loss.   Inquired what Pt has eaten today. She consumed a bagel w/peanut butter this am & now wants to eat again. Pt is consuming a gluten-free diet. Reiterated to Pt Clincian concern for disordered eating patterns that have never been Dx'd or treated. Placed call to Dr. Iver Nestle, PhD to expedite Referral Consult @ [phone]-

## 2020-10-08 ENCOUNTER — Other Ambulatory Visit: Payer: Self-pay | Admitting: Physical Medicine and Rehabilitation

## 2020-10-08 DIAGNOSIS — K30 Functional dyspepsia: Secondary | ICD-10-CM

## 2020-10-10 LAB — IRON,TIBC AND FERRITIN PANEL
Ferritin: 36 ng/mL (ref 15–150)
Iron Saturation: 27 % (ref 15–55)
Iron: 94 ug/dL (ref 27–159)
Total Iron Binding Capacity: 349 ug/dL (ref 250–450)
UIBC: 255 ug/dL (ref 131–425)

## 2020-10-10 LAB — LIPID PANEL W/O CHOL/HDL RATIO
Cholesterol, Total: 135 mg/dL (ref 100–199)
HDL: 72 mg/dL (ref >39)
LDL Chol Calc (NIH): 53 mg/dL (ref 0–99)
Triglycerides: 38 mg/dL (ref 0–149)
VLDL Cholesterol Cal: 10 mg/dL (ref 5–40)

## 2020-10-10 LAB — ESTROGENS, TOTAL: Estrogen: 371 pg/mL

## 2020-10-10 LAB — CBC WITH DIFFERENTIAL/PLATELET
Basophils Absolute: 0 10*3/uL (ref 0.0–0.2)
Basos: 1 %
EOS (ABSOLUTE): 0 10*3/uL (ref 0.0–0.4)
Eos: 1 %
Hematocrit: 39.5 % (ref 34.0–46.6)
Hemoglobin: 13.2 g/dL (ref 11.1–15.9)
Immature Grans (Abs): 0 10*3/uL (ref 0.0–0.1)
Immature Granulocytes: 0 %
Lymphocytes Absolute: 2 10*3/uL (ref 0.7–3.1)
Lymphs: 41 %
MCH: 30.1 pg (ref 26.6–33.0)
MCHC: 33.4 g/dL (ref 31.5–35.7)
MCV: 90 fL (ref 79–97)
Monocytes Absolute: 0.3 10*3/uL (ref 0.1–0.9)
Monocytes: 6 %
Neutrophils Absolute: 2.5 10*3/uL (ref 1.4–7.0)
Neutrophils: 51 %
Platelets: 234 10*3/uL (ref 150–450)
RBC: 4.39 x10E6/uL (ref 3.77–5.28)
RDW: 11.8 % (ref 11.7–15.4)
WBC: 4.8 10*3/uL (ref 3.4–10.8)

## 2020-10-10 LAB — HEMOGLOBIN A1C
Est. average glucose Bld gHb Est-mCnc: 105 mg/dL
Hgb A1c MFr Bld: 5.3 % (ref 4.8–5.6)

## 2020-10-10 LAB — DHEA: Dehydroepiandrosterone: 817 ng/dL — ABNORMAL HIGH (ref 31–701)

## 2020-10-10 LAB — DIHYDROTESTOSTERONE: Dihydrotestosterone: 14 ng/dL

## 2020-10-10 LAB — VITAMIN D 25 HYDROXY (VIT D DEFICIENCY, FRACTURES): Vit D, 25-Hydroxy: 37.4 ng/mL (ref 30.0–100.0)

## 2020-10-10 LAB — CORTISOL: Cortisol: 19.3 ug/dL

## 2020-10-10 LAB — TESTOSTERONE: Testosterone: 26 ng/dL (ref 13–71)

## 2020-10-11 ENCOUNTER — Encounter: Payer: Self-pay | Admitting: Student

## 2020-10-11 ENCOUNTER — Telehealth: Payer: Self-pay | Admitting: Behavioral Health

## 2020-10-11 ENCOUNTER — Ambulatory Visit (INDEPENDENT_AMBULATORY_CARE_PROVIDER_SITE_OTHER): Payer: BC Managed Care – PPO | Admitting: Student

## 2020-10-11 ENCOUNTER — Encounter (HOSPITAL_BASED_OUTPATIENT_CLINIC_OR_DEPARTMENT_OTHER): Payer: BC Managed Care – PPO | Admitting: Physical Medicine and Rehabilitation

## 2020-10-11 ENCOUNTER — Ambulatory Visit: Payer: BC Managed Care – PPO | Admitting: Behavioral Health

## 2020-10-11 ENCOUNTER — Other Ambulatory Visit: Payer: Self-pay

## 2020-10-11 ENCOUNTER — Encounter: Payer: Self-pay | Admitting: Physical Medicine and Rehabilitation

## 2020-10-11 VITALS — BP 112/85 | HR 91 | Temp 98.9°F | Ht 64.0 in | Wt 107.7 lb

## 2020-10-11 DIAGNOSIS — F332 Major depressive disorder, recurrent severe without psychotic features: Secondary | ICD-10-CM

## 2020-10-11 DIAGNOSIS — M6289 Other specified disorders of muscle: Secondary | ICD-10-CM

## 2020-10-11 DIAGNOSIS — E278 Other specified disorders of adrenal gland: Secondary | ICD-10-CM

## 2020-10-11 DIAGNOSIS — L700 Acne vulgaris: Secondary | ICD-10-CM | POA: Diagnosis not present

## 2020-10-11 DIAGNOSIS — R7989 Other specified abnormal findings of blood chemistry: Secondary | ICD-10-CM

## 2020-10-11 DIAGNOSIS — G4701 Insomnia due to medical condition: Secondary | ICD-10-CM

## 2020-10-11 DIAGNOSIS — F419 Anxiety disorder, unspecified: Secondary | ICD-10-CM

## 2020-10-11 DIAGNOSIS — F5089 Other specified eating disorder: Secondary | ICD-10-CM

## 2020-10-11 MED ORDER — ONDANSETRON HCL 4 MG PO TABS: 4.0000 mg | ORAL_TABLET | Freq: Three times a day (TID) | ORAL | 0 refills | Status: DC | PRN

## 2020-10-11 MED ORDER — BUSPIRONE HCL 5 MG PO TABS: 5.0000 mg | ORAL_TABLET | Freq: Three times a day (TID) | ORAL | 3 refills | Status: DC | PRN

## 2020-10-11 NOTE — Assessment & Plan Note (Signed)
Patient with history of recurrent major depression.  [facility] for [facility]otherapy.  Since last visit, she has been seeing Dr. Monna Fam and reports that this has been helping.  She is not currently on any antidepressant medications to help better control her symptoms.  PHQ-9 of 19.5 today (with 0.5 points for thoughts of harming herself).  Discussed obtaining prior medical records to see which medications or tried and which ones were helpful.  She states that she will try to obtain medical records from [facility] them to [place] so that we can have a more complete picture of her psychiatric history.  She is currently on Klonopin as needed for anxiety.  Hopeful that once her records are obtained we can start medications to help better control her depression and anxiety.  Plan: -klonopin prn for anxiety -awaiting medical records from [facility] -next visit with Dr. Monna Fam on 10/25/2020 -following [facility] as outpatient

## 2020-10-11 NOTE — Progress Notes (Signed)
   CC: f/u of pelvic pain and medication refill  HPI:  [name] is a 25 y.o. female with history listed below presenting to the [facility] for f/u of pelvic pain and medication refill. Please see individualized problem based charting for full HPI.  Past Medical History:  Diagnosis Date  . Asthma   . Bronchitis   . Depression     Review of Systems:  Negative aside from that listed in individualized problem based charting.  Physical Exam:  Vitals:   10/11/20 1029  BP: 112/85  Pulse: 91  Temp: 98.9 F (37.2 C)  TempSrc: Oral  SpO2: 100%  Weight: 107 lb 11.2 oz (48.9 kg)  Height: 5\' 4"  (1.626 m)   Physical Exam Constitutional:      Comments: Anorexic female, NAD.  HENT:     Mouth/Throat:     Mouth: Mucous membranes are moist.     Pharynx: Oropharynx is clear. No oropharyngeal exudate.  Eyes:     Extraocular Movements: Extraocular movements intact.     Conjunctiva/sclera: Conjunctivae normal.     Pupils: Pupils are equal, round, and reactive to light.  Cardiovascular:     Rate and Rhythm: Normal rate and regular rhythm.     Pulses: Normal pulses.     Heart sounds: Normal heart sounds. No murmur heard. No friction rub. No gallop.   Pulmonary:     Effort: Pulmonary effort is normal.     Breath sounds: Normal breath sounds. No wheezing, rhonchi or rales.  Abdominal:     General: Bowel sounds are normal. There is no distension.     Palpations: Abdomen is soft.     Tenderness: There is no abdominal tenderness.  Musculoskeletal:        General: No swelling. Normal range of motion.  Skin:    General: Skin is warm and dry.  Neurological:     General: No focal deficit present.     Mental Status: She is alert and oriented to person, place, and time.  Psychiatric:     Comments: Depressed mood, flat affect      Assessment & Plan:   See Encounters Tab for problem based charting.  Patient discussed with Dr. 

## 2020-10-11 NOTE — Progress Notes (Signed)
Internal Medicine Clinic Attending  Case discussed with Dr. Austin Miles  At the time of the visit.  We reviewed the resident's history and exam and pertinent patient test results.  I agree with the assessment, diagnosis, and plan of care documented in the resident's note.   Patient here with complaint of chronic pelvic pain. Seen by gynecology and urogynecology and diagnosed with pelvic floor dysfunction. She believes she has another disorder that is undiagnosed. Concerned for "lean PCOS". However pelvic pain is not a typical symptom of PCOS and her menstrual cycles are regular. Prior lab work up is not consistent. Her hair loss is more likely related to her anorexia and being underweight. Recommend reassurance and follow up with gynecology. New referral placed for second opinion.

## 2020-10-11 NOTE — Progress Notes (Addendum)
Subjective:    [name], female    DOB: Jul 27, 1995, 25 y.o.   MRN: [id number]  HPI  Due to national recommendations of social distancing because of COVID 67, an audio/video tele-health visit is felt to be the most appropriate encounter for this patient at this time. See MyChart message from today for the patient's consent to a tele-health encounter with [facility]. This is a follow up tele-visit via Webex. The patient is at home. MD is at office.   Ms Morace is a 25 year old woman who presents for f/u of chronic nerve pain.   1) Chronic nerve pain:  -Pain on average is 6/10 -Pain right now is 3/10 -She uses a heating pad. Alternates ice for short times -She has occasional flare-ups concentrated around her mid-section.  -She is currently on Cymbalta which can help with nerve pain.  -she was started on Gabapentin and felt double vision and tingling on lips.   2) Pelvic floor dysfunction: -She is starting with a new therapist soon.  -Had a pelvic MRI at [facility] and reviewed this with her and was negative.   3) GI symptoms -Struggled with these since she was 25 years old.   4) History of multiple suicidal ideations  5) Spasms: -present throughout her body -her chriopractor may start her on a muscle relaxer.   6) Depression:  -suffered night sweats from Cymbalta.   7) Insomnia:  -sleep has been very poor for a year.  -her job ends at 10:30.   8) Anxiety: -she takes clonazepam as needed.  -has been working long hours at night without breaks -received a note saying she can only do 4 days per week.  9) ADHD: has not found much improvement in focus with Ritalin.   10. Elevated DHEA: -discussed with her PCP today -discussed that other lab markers were normal -she has been researching lean PCOS- her mother and grandmother also suffered from these symptoms- they had ovarian cysts and hirsuitism and her mom developed  DM in her 35s and never received treatment for the PCOS. She discussed this with her PCP and she was referred to an OBGYN.    Pain Inventory Average Pain 6 Pain Right Now 6 My pain is constant, sharp and aching  In the last 24 hours, has pain interfered with the following? General activity 8 Relation with others 8 Enjoyment of life 10 What TIME of day is your pain at its worst? morning  and night Sleep (in general) Poor  Pain is worse with: walking, bending, sitting and inactivity Pain improves with: heat/ice and therapy/exercise Relief from Meds: 0      Family History  Problem Relation Age of Onset  . Diabetes Mother   . Heart disease Paternal Uncle    Social History   Socioeconomic History  . Marital status: Single    Spouse name: Not on file  . Number of children: Not on file  . Years of education: Not on file  . Highest education level: Not on file  Occupational History  . Not on file  Tobacco Use  . Smoking status: Former Smoker    Types: E-cigarettes  . Smokeless tobacco: Never Used  Vaping Use  . Vaping Use: Former  . Substances: Synthetic cannabinoids  Substance and Sexual Activity  . Alcohol use: Not Currently    Alcohol/week: 20.0 standard drinks    Types: 8 Glasses of wine, 12 Cans of beer per week  .  Drug use: No  . Sexual activity: Not on file  Other Topics Concern  . Not on file  Social History Narrative  . Not on file   Social Determinants of Health   Financial Resource Strain: Not on file  Food Insecurity: Not on file  Transportation Needs: Not on file  Physical Activity: Not on file  Stress: Not on file  Social Connections: Not on file   Past Surgical History:  Procedure Laterality Date  . WISDOM TOOTH EXTRACTION     Past Medical History:  Diagnosis Date  . Asthma   . Bronchitis   . Depression    BP 112/85 Comment: last recorded in chart from today PCP  Pulse 91 Comment: last recorded in chart from today PCP  Temp 98.9 F  (37.2 C) Comment: last recorded in chart from today PCP  Ht 5\' 4"  (1.626 m)   Wt 107 lb 11.2 oz (48.9 kg) Comment: last recorded in chart from today PCP  BMI 18.49 kg/m   Opioid Risk Score:   Fall Risk Score:  `1  Depression screen PHQ 2/9  Depression screen [facility] 2/9 10/11/2020 09/28/2020 09/21/2020 07/29/2020 07/09/2020 06/28/2020  Decreased Interest 1 1 0 2 3 1   Down, Depressed, Hopeless 1 1 1 2 3 2   PHQ - 2 Score 2 2 1 4 6 3   Altered sleeping - - 3 3 3 3   Tired, decreased energy - - 3 3 3 3   Change in appetite - - 3 3 3 3   Feeling bad or failure about yourself  - - 2 3 3 3   Trouble concentrating - - 3 3 3 3   Moving slowly or fidgety/restless - - 2 3 3 3   Suicidal thoughts - - 1 1 1 3   PHQ-9 Score - - 18 23 25 24   Difficult doing work/chores - - Extremely dIfficult Very difficult Extremely dIfficult -    Review of Systems  Constitutional: Negative for diaphoresis and unexpected weight change.  HENT: Negative.   Eyes: Negative.   Respiratory: Negative for shortness of breath.   Cardiovascular: Negative for leg swelling.  Gastrointestinal: Negative for abdominal pain, constipation, diarrhea and nausea.  Endocrine: Negative.   Genitourinary: Positive for pelvic pain.       Bladder control  Musculoskeletal: Positive for back pain and myalgias.  Skin: Negative.   Allergic/Immunologic: Negative.   Neurological: Negative for dizziness and weakness.       Tingling  Hematological: Does not bruise/bleed easily.  Psychiatric/Behavioral: Negative for confusion, dysphoric mood and suicidal ideas. The patient is not nervous/anxious.   All other systems reviewed and are negative.      Objective:   Physical Exam Not performed as patient was seen via phone visit.     Assessment & Plan:  1) Chronic Pain Syndrome secondary to fibromyalgia -Discussed current symptoms of pain and history of pain.  -Discussed benefits of exercise in reducing pain. -Apply emu oil. -discussed benefits of  minimizing medications.  -Lupus panel ordered.  -RA and ANA reviewed and negative.  -Discussed trigger point injections -Discussed following foods that may reduce pain: 1) Ginger 2) Blueberries 3) Salmon 4) Pumpkin seeds 5) dark chocolate 6) turmeric 7) tart cherries 8) virgin olive oil 9) chilli peppers 10) mint  Link to further information on diet for chronic pain: 08/26/2020  2) Pelvic floor dysfunction: -continue pelvic floor therapy.  -estrogen, testosterone,  level  3) Depression: -she has been going to therapy twice per week. -had negative response to Cymbalta (  night sweats).   4) Functional dyspepsia: -refer to gastroenterology for gastric emptying study.  -prescribed Buspar 5mg  TID PRN.   5) contraception: -started a NuvaRing a couple of days ago.   6) pain in lower back and legs: -stop Gabapentin  7) insomnia: -she prefers not to try the amitriptyline 10mg  given her prior negative experiences with antidepressants.   8) elevated DHEA: -continue with plan to see OBGYN for further workup. -discussed potential etiologies and treatment options -advised ashwagandha 500-2000mg   -she educated me regarding lean PCOS type and I agree this could be a potential cause.   20 minutes spent in discussion of patient's lab results, possible etiologies for elevated DHEA, plan for f/u with OB/GYN, ashwaghandha, her discussion with her PCP, that she did not want to try amitriptyline, discussed PCOS treatment options, discussed buspar for functional dyspepsia and anxiety

## 2020-10-11 NOTE — Telephone Encounter (Signed)
Contacted Pt re: f/u for Referral to Dr. Wyona Almas, PhD to Eval/Assess Pt eating habits. Gave Pt confidential direct line to schedule herself per Dr. Gerilyn Pilgrim' request. Pt acknowledged information & will f/u. Pt due for appt today w/Dr. Merrilyn Puma, MD @ 10:15am. Pt receptive to meet & greet this morning.  Dr. Monna Fam

## 2020-10-11 NOTE — Assessment & Plan Note (Signed)
Patient here for follow-up of chronic pelvic pain.  She was previously on birth control for 9 years and reports that her symptoms became worse when stopping birth control last year.  She was previously seen by gynecology and urogynecology for her symptoms and was diagnosed with pelvic floor dysfunction and levator spasm.  She believes that her pelvic pain is part of another underlying disorder.  States that her mother and her grandmother both reported having lean PCOS but were never treated.  She believes that she also has the same thing.  She reports having excessive hair loss, acne vulgaris, and excessive oily hair.  States that since she has been off of birth control, her menstrual cycles have been about 45 days apart and each cycle lasts about 4 days. Her BMI is 18.49.  She does not have a history of diabetes or dyslipidemia.  Hormonal testing was negative, aside from mild elevation in DHEA.  For pelvic floor dysfunction, she has been working with pelvic floor physical therapy specialist and reports improvement in symptoms although this has been very gradual.  I spoke with her at length that she does not have the typical symptoms of PCOS in regards to hirsutism, oligomenorrhea, hyperandrogenism, obesity, insulin resistance or impaired glucose tolerance, dyslipidemia. However, she would like to undergo further workup for this. Discussed that this is outside of my scope of practice and that she would benefit from a gynecological evaluation.  She reports having a bad experience with a prior gynecologist, but we agreed on obtaining a second opinion from a different gynecologist.  She will continue to follow-up with pelvic floor PT specialist.  There is the possibility that some of her symptoms are linked to her anorexia.  She has been seeing Dr. Monna Fam and she is scheduled to see Wyona Almas for nutrition management.  Hopeful that with improved nutrition some of her symptoms including hair loss will  improve.  Plan: -second opinion from gynecology -continue with pelvic floor PT -seeing nutritionist for anorexia

## 2020-10-11 NOTE — Patient Instructions (Addendum)
Brianna [name],  It was a pleasure seeing you in the clinic today.   1. I have placed a referral to gynecology for further evaluation of your symptoms.  2. Please speak with Physical Medicine doctor about hydrotherapy.  3. I have refilled your zofran.  4. Please follow up with Dr. Monna Fam and with your nutritionist  Please call our clinic at [phone] if you have any questions or concerns. The best time to call is Monday-Friday from 9am-4pm, but there is someone available 24/7 at the same number. If you need medication refills, please notify your pharmacy one week in advance and they will send [place] a request.   Thank you for letting us take part in your care. We look forward to seeing you next time!

## 2020-10-12 ENCOUNTER — Telehealth: Payer: Self-pay | Admitting: Behavioral Health

## 2020-10-12 ENCOUNTER — Encounter: Payer: BC Managed Care – PPO | Attending: Internal Medicine | Admitting: Physical Therapy

## 2020-10-12 ENCOUNTER — Encounter: Payer: Self-pay | Admitting: Physical Therapy

## 2020-10-12 ENCOUNTER — Other Ambulatory Visit: Payer: Self-pay

## 2020-10-12 DIAGNOSIS — R278 Other lack of coordination: Secondary | ICD-10-CM | POA: Diagnosis not present

## 2020-10-12 DIAGNOSIS — R109 Unspecified abdominal pain: Secondary | ICD-10-CM | POA: Diagnosis not present

## 2020-10-12 DIAGNOSIS — M6281 Muscle weakness (generalized): Secondary | ICD-10-CM | POA: Diagnosis not present

## 2020-10-12 DIAGNOSIS — R41841 Cognitive communication deficit: Secondary | ICD-10-CM | POA: Diagnosis not present

## 2020-10-12 DIAGNOSIS — R634 Abnormal weight loss: Secondary | ICD-10-CM | POA: Diagnosis not present

## 2020-10-12 DIAGNOSIS — R194 Change in bowel habit: Secondary | ICD-10-CM | POA: Diagnosis not present

## 2020-10-12 DIAGNOSIS — R252 Cramp and spasm: Secondary | ICD-10-CM | POA: Diagnosis not present

## 2020-10-12 DIAGNOSIS — R197 Diarrhea, unspecified: Secondary | ICD-10-CM | POA: Diagnosis not present

## 2020-10-12 DIAGNOSIS — R110 Nausea: Secondary | ICD-10-CM | POA: Diagnosis not present

## 2020-10-12 NOTE — Telephone Encounter (Signed)
Provided Pt w/Referral info & directions from Dr. Wyona Almas, PhD. Pt related she has visit w/Dr. Austin Miles today @ 10:15am. Agreed to meet.  Dr. Monna Fam

## 2020-10-12 NOTE — Patient Instructions (Signed)
Access Code: [email or web address] URL: [email or web address] Date: 10/12/2020 Prepared by: Eulis Foster  Exercises Supine Hamstring Stretch with Strap - 1 x daily - 7 x weekly - 1 sets - 2 reps - 30 sec hold Supine ITB Stretch with Strap - 1 x daily - 7 x weekly - 1 sets - 2 reps - 30 sec hold Hip Adductors and Hamstring Stretch with Strap - 1 x daily - 7 x weekly - 1 sets - 2 reps - 30 sec hold Supine Pelvic Floor Stretch - 1 x daily - 7 x weekly - 1 sets - 1 reps - 1 in hold  Eulis Foster, PT [facility] [address] [place], [place] [address] W: [phone] [name]

## 2020-10-12 NOTE — Therapy (Signed)
[facility] [address] [place], [place], [address] Phone: [phone]   Fax:  [phone]  Physical Therapy Treatment  Patient Details  Name: Brianna [name] MRN: [id number] Date of Birth: 1995/06/15 Referring Provider (PT): Dr. Miguel Aschoff   Encounter Date: 10/12/2020   PT End of Session - 10/12/20 1018    Visit Number 2    Date for PT Re-Evaluation 02/07/21    Authorization Type BCBS    PT Start Time [phone]    PT Stop Time 1019    PT Time Calculation (min) 42 min    Activity Tolerance Patient tolerated treatment well;Patient limited by pain    Behavior During Therapy [facility] for tasks assessed/performed           Past Medical History:  Diagnosis Date  . Asthma   . Bronchitis   . Depression     Past Surgical History:  Procedure Laterality Date  . WISDOM TOOTH EXTRACTION      There were no vitals filed for this visit.   Subjective Assessment - 10/12/20 0938    Subjective I did get my cycle last night and is very crampy. First one since I took the pill abortion. Testosterone is low and DHEAS is slightly elevated. Could be signs of PCOS. I will be having a gynecological exam. I will be seeing a nutrionalist.    Patient Stated Goals reduce pain    Currently in Pain? Yes    Pain Score 7     Pain Location Pelvis    Pain Orientation Right;Left    Pain Descriptors / Indicators Aching;Stabbing;Spasm    Pain Type Chronic pain    Pain Radiating Towards abdominal and pelvic floor    Pain Onset Other (comment)    Pain Frequency Constant    Aggravating Factors  walking, sleeping on side, sitting for long , driving    Pain Relieving Factors hot pack    Multiple Pain Sites Yes    Pain Score 2    Pain Location Back    Pain Orientation Lower    Pain Descriptors / Indicators Aching;Tightness    Pain Type Chronic pain    Pain Onset More than a month ago    Pain Frequency Constant    Aggravating  Factors  sitting and lean forward, sitting and walking for too long, lifting    Pain Relieving Factors heat    Pain Score 7    Pain Location Pelvis    Pain Orientation Right;Left    Pain Descriptors / Indicators Shooting;Stabbing    Pain Type Chronic pain    Pain Onset More than a month ago    Pain Frequency Intermittent    Aggravating Factors  not sure    Pain Relieving Factors heat                             [facility] Adult PT Treatment/Exercise - 10/12/20 0001      Self-Care   Self-Care Other Self-Care Comments    Other Self-Care Comments  discussed foods that the gut can be sensitive like diary and glutens      Neuro Re-ed    Neuro Re-ed Details  diaphragmatic breathing to elongate the diaphragm and pelvic floor while manual work was performed      Lumbar Exercises: Stretches   Active Hamstring Stretch Right;Left;1 rep;30 seconds    Active Hamstring Stretch Limitations supine with strap  ITB Stretch Right;Left;1 rep;30 seconds    ITB Stretch Limitations supine with strap    Other Lumbar Stretch Exercise hip adductors with strap supine, right, left, 30 seconds    Other Lumbar Stretch Exercise happy baby with diaphragmatic breathing      Manual Therapy   Manual Therapy Myofascial release    Myofascial Release fascial release through the layers for the thoracic inlet, relspiratory diaphragm and urogenital diaphragm                  PT Education - 10/12/20 1017    Education Details Access Code: [email or web address]    Person(s) Educated Patient    Methods Explanation;Demonstration;Verbal cues;Handout    Comprehension Returned demonstration;Verbalized understanding            PT Short Term Goals - 10/07/20 1356      PT SHORT TERM GOAL #1   Title patient is independent with initial HEP    Time 4    Period Weeks    Status On-going    Target Date 11/04/20      PT SHORT TERM GOAL #2   Title patient is able to use her dilator/wand correctly to reduce  trigger points    Time 4    Period Weeks    Status New    Target Date 11/04/20      PT SHORT TERM GOAL #3   Title patient is able to perform diaphragmatic breathing to bulge the pelvic floor    Time 4    Period Weeks    Status New    Target Date 11/04/20      PT SHORT TERM GOAL #4   Title patient understands ways to calm the nervous system to reduce her pain    Time 4    Period Weeks    Status New    Target Date 11/04/20      PT SHORT TERM GOAL #5   Title patient reports her pain intensity is reduced 25% of the day    Time 4    Period Weeks    Status New    Target Date 11/04/20             PT Long Term Goals - 10/07/20 1357      PT LONG TERM GOAL #1   Title Patient is independent with advanced HEP    Time 4    Period Months    Status New    Target Date 02/07/21      PT LONG TERM GOAL #2   Title pelvic pain decreased </= 3/10 50-60% of the time due to reduction of trigger points    Time 4    Period Months    Status New    Target Date 02/07/21      PT LONG TERM GOAL #3   Title patient is able to fully empty her bowels due to improve pelvic floor coordination during a bowel movement    Time 4    Period Months    Status New    Target Date 02/07/21      PT LONG TERM GOAL #4   Title patient is able to fully empty her bladder and is able to wait 2-3 hours to urinate due to relaxation of the pelvic floor    Time 4    Period Months    Status New    Target Date 02/07/21                 Plan -  10/12/20 1019    Clinical Impression Statement AFter the manual work  to release the fascial of the diaphragms increased pelvic and back pain to 8/10. Patient is sensitive to manual work. Patinet has difficulty with diaphragmatic breathing to bulge the pelvic floor. Her breath will stop at the pubic bone. Patient has tight muscle  so her stretches are in small range. She was on her cycle so not pelvic floor work was done today. Patient will benefit from skilled  therapy to improve pelvic floor coordination and strength while reducing her pain to improve daily life tasks.    Personal Factors and Comorbidities Time since onset of injury/illness/exacerbation;Finances;Sex;Profession;Past/Current Experience    Examination-Activity Limitations Locomotion Level;Toileting;Stand    Examination-Participation Restrictions Occupation;Community Activity;Interpersonal Relationship    Stability/Clinical Decision Making Stable/Uncomplicated    Rehab Potential Excellent    PT Frequency 1x / week    PT Duration Other (comment)   4 months   PT Treatment/Interventions ADLs/Self Care Home Management;Biofeedback;Cryotherapy;Electrical Stimulation;Iontophoresis 4mg /ml Dexamethasone;Moist Heat;Ultrasound;Neuromuscular re-education;Therapeutic exercise;Therapeutic activities;Patient/family education;Manual techniques;Dry needling;Taping;Spinal Manipulations;Joint Manipulations    PT Next Visit Plan diaphragmatic breathing to  bulge the pelvic floor; fascial release in the 3 diaphragms with heat on back; ; ways to calm the nervous system; manual work to abdominal, and thighs; manual work internally if tolerate education on ways to self massage the muscles with tennis ball, foam roller, prickly roller, and legs up the wall    PT Home Exercise Plan Access Code: [email or web address]    Consulted and Agree with Plan of Care Patient           Patient will benefit from skilled therapeutic intervention in order to improve the following deficits and impairments:  Decreased coordination,Increased fascial restricitons,Decreased endurance,Decreased activity tolerance,Dizziness,Pain,Decreased strength  Visit Diagnosis: Muscle weakness (generalized)  Cramp and spasm  Other lack of coordination     Problem List Patient Active Problem List   Diagnosis Date Noted  . IBS (irritable bowel syndrome) 08/02/2020  . Pelvic floor dysfunction 08/02/2020  . Endometriosis 08/02/2020  . Irritant  dermatitis 08/02/2020  . Birth control counseling 08/02/2020  . Gastroesophageal reflux disease 06/28/2020  . Severe recurrent major depression without psychotic features (HCC) 06/28/2020  . Multiple joint pain 06/28/2020  . 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 Tachycardia 06/28/2020  . History of COVID-19 06/28/2020  . 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 Atypical chest pain 06/22/2020  . Dizziness 06/22/2020  . SOB (shortness of breath) 06/22/2020  . Palpitations 06/14/2020  . Anxiety 05/04/2020  . Internal and external hemorrhoids without complication 04/29/2020  . Pelvic prolapse 02/20/2020  . Acute recurrent pansinusitis 10/21/2019  . ETD (Eustachian tube dysfunction), bilateral 10/21/2019  . Cat allergy due to both airborne and skin contact 05/01/2016  . Chronic sore throat 05/01/2016  . Acne vulgaris 03/11/2014  . Mild intermittent asthma without complication 03/11/2014  . Seasonal allergic rhinitis 03/11/2014  . Seasonal allergies 03/11/2014    03/13/2014, PT 10/12/20 10:28 AM   [facility] [address] [place], [place], [place] Phone: [phone]   Fax:  [phone]  Name: Luanne Krzyzanowski MRN: [name] Date of Birth: Aug 28, 1995

## 2020-10-12 NOTE — BH Specialist Note (Signed)
Integrated Behavioral Health Follow Up In-Person Visit  MRN: [id number] Name: Brianna [name]  Number of Integrated Behavioral Health Clinician visits: 3/6 Session Start time: ~10:45am  Session End time: ~11:30am Total time: 45  minutes  Types of Service: Collaborative care  Interpretor:No. Interpretor Name and Language: n/a   Subjective: Brianna [name] is a 25 y.o. female accompanied by self Patient was referred by Dr. Merrilyn Puma, MD for supportive care due to mental health issues co-morbid w/health status changes. Patient reports the following symptoms/concerns: elevated anx/dep due to multiple health issues that are currently occurring. Pt is stressed & anxious. Pt is tearful about her health & has done research on lean PCOS. There is a Hx in her family maternally. She feels she is presenting in an uncommon way & wants to explore if this & her other conditions are related. Consulted w/Pt & Dr. Austin Miles @ his request & meet & greet for the Pt. Duration of problem: months; Severity of problem: moderate to severe  Objective: Mood: Anxious, Depressed and emotionally tearful & needing support and Affect: Tearful, congruent w/mood Risk of harm to self or others: No plan to harm self or others  Life Context: Family and Social: Pt lives w/her [employer]; Pt feels lack of support from her Parents School/Work: Pt graduated w/her [employer] degree in 2020; she wants a job in her field Self-Care: Pt listens to music, does Nurse, mental health, & generally cares for self & health Life Changes: Pt has exp'd multiple health status changes this year. She is eager to have some answers.   Patient and/or Family's Strengths/Protective Factors: Social connections, Concrete supports in place (healthy food, safe environments, etc.), Sense of purpose, Physical Health (exercise, healthy diet, medication compliance, etc.) and Patient resilience  Goals Addressed: Patient will: 1.  Reduce symptoms  of: anxiety, depression and stress  2.  Increase knowledge and/or ability of: coping skills, healthy habits and stress reduction  3.  Demonstrate ability to: Increase healthy adjustment to current life circumstances  Progress towards Goals: Ongoing  Interventions: Interventions utilized:  Mindfulness or Management consultant, Supportive Counseling, Sleep Hygiene and Psychoeducation and/or Health Education Standardized Assessments completed: appropriate use of screeners prn. Today Pt presents w/GAD-7 of 21: severe anxiety.   Patient and/or Family Response: Pt receptive to meet & greet upon conclusion of her visit w/Dr. Austin Miles, MD. Pt is on Clinician's schedule for f/u care.  Patient Centered Plan: Patient is on the following Treatment Plan(s): Estb app[facility] w/[facility] to address disordered eating issues. Follow Dr. Everardo Pacific orders re: your Klonipin prescription. F/U with new recommendation for Gynecologist Dr. Ilene Qua, MD.  Assessment: Patient currently experiencing elevated anx/dep due to more recent health challenges. Pt is exp'g psychological overwhelm due to addressing these issues. Pt is frustrated the entire picture for her health cannot be solved, but Dr. Austin Miles reassured her we will take it one step @ a time. Pt acknowledged.  Discussed briefly w/Pt the dynamics of communication w/her Providers. Learning & sharing is a two way street. She can approach Providers w/inc'd tact as well as Providers having more positive/encouraging communication with her about ideas for Tx & her expertise re: her own body.  Patient may benefit from the resources presented today. Pt cont'g to f/u as suggested & using the skills & tools for reducing anxiety rxns.  Plan: 1. Follow up with behavioral health clinician on : Next scheduled appt 2. Behavioral recommendations: Use anti-anxiety apps @ your discretion-explore more of these so you  have choices. Use the ice technique to  re-direct your Prefrontal Cortex if you feel an anxiety attack is occurring. Journal btwn sessions w/Clinicians if you feel this is helpful. 3. Referral(s): Dr. Ilene Qua, MD for Gynecology care & Dr. Wyona Almas, PhD for disordered eating pattern Eval/Assessment. [facility] Recs from [facility] Psych[facility]rist. 4. "From scale of 1-10, how likely are you to follow plan?": 9  [name], LMFT

## 2020-10-13 ENCOUNTER — Ambulatory Visit: Payer: BC Managed Care – PPO | Admitting: Internal Medicine

## 2020-10-13 DIAGNOSIS — R109 Unspecified abdominal pain: Secondary | ICD-10-CM | POA: Diagnosis not present

## 2020-10-13 DIAGNOSIS — R197 Diarrhea, unspecified: Secondary | ICD-10-CM | POA: Diagnosis not present

## 2020-10-13 DIAGNOSIS — F411 Generalized anxiety disorder: Secondary | ICD-10-CM | POA: Diagnosis not present

## 2020-10-14 ENCOUNTER — Ambulatory Visit: Payer: BC Managed Care – PPO | Admitting: Behavioral Health

## 2020-10-14 ENCOUNTER — Other Ambulatory Visit: Payer: Self-pay

## 2020-10-14 ENCOUNTER — Telehealth: Payer: Self-pay | Admitting: Student

## 2020-10-14 DIAGNOSIS — F419 Anxiety disorder, unspecified: Secondary | ICD-10-CM

## 2020-10-14 DIAGNOSIS — F331 Major depressive disorder, recurrent, moderate: Secondary | ICD-10-CM

## 2020-10-14 NOTE — Telephone Encounter (Signed)
Per chart review, patient cancelled an appointment today with Dr. Monna Fam.  Will forward to Dr. Virgina Organ, RN,BSN

## 2020-10-14 NOTE — BH Specialist Note (Signed)
Integrated Behavioral Health via Telemedicine Visit  10/14/2020 Brianna [name] [id number]  Number of Integrated Behavioral Health visits: 4/6 Session Start time: 2:00pm  Session End time: 2:45pm Total time: 45   Referring Provider: Dr. Merrilyn Puma, MD Patient/Family location: Pt is in private [facility] Provider location: [facility] Office All persons participating in visit: Pt & Clinician Types of Service: Individual psychotherapy  I connected with [name] and/or Revonda Standard Khatoon's self via  Telephone or Engineer, civil (consulting)  (Video is Surveyor, mining) and verified that I am speaking with the correct person using two identifiers. Discussed confidentiality: Yes   I discussed the limitations of telemedicine and the availability of in person appointments.  Discussed there is a possibility of technology failure and discussed alternative modes of communication if that failure occurs.  I discussed that engaging in this telemedicine visit, they consent to the provision of behavioral healthcare and the services will be billed under their insurance.  Patient and/or legal guardian expressed understanding and consented to Telemedicine visit: Yes   Presenting Concerns: Patient and/or family reports the following symptoms/concerns: elevated anxiety due to work stressors. Pt c/o inc'd sensitivity around others Duration of problem: years; Severity of problem: moderate to severe  Patient and/or Family's Strengths/Protective Factors: Concrete supports in place (healthy food, safe environments, etc.), Physical Health (exercise, healthy diet, medication compliance, etc.) and Pt has positive response to tools offered in session & knows she can pursue these at her leisure & to the extent she prefers.   Goals Addressed: Patient will: 1.  Reduce symptoms of: anxiety, depression, insomnia, mood instability, stress and attend/notice triggering events & any patterns invld   2.  Increase knowledge and/or ability of: coping skills, healthy habits, self-management skills and stress reduction  3.  Demonstrate ability to: Increase healthy adjustment to current life circumstances and examine issues since childhood & place them in a healthy perspective  Progress towards Goals: Ongoing  Interventions: Interventions utilized:  Solution-Focused Strategies, Mindfulness or Management consultant, Behavioral Activation and Supportive Counseling Standardized Assessments completed: screeners prn  Patient and/or Family Response: Pt receptive to call today & requests future appt  Assessment: Patient currently experiencing elevated anx/dep Sx trying to manage daily life w/confidence & self-efficacy.   Patient may benefit from cont'd attn to health status issues as she does normally & fine-tune awareness of mental health wellness needs.  Pt shared Hx of cutting, being suicidal, & difficulty w/friendships in [facility].  Plan: 1. Follow up with behavioral health clinician on : 10/25/2020 @ 11:00am 2. Behavioral recommendations: Find & read Dr. Judithann Sauger, MD's website-explore her resources for understanding Empaths. Research Psychology Today website for Providers who conduct EMDR. Explore the process of Tapping online. Use LinkedIn or Indeed.com for Resume experts who can give objective feedback. Do some job hunting when you can! Cont w/[employer] @ [employer]. Remember you can't control others' beh, only your response to it. Walk it off when you feel emot'ly dysregulated. Watch Ruby Wax on TedTalks. 3. Referral(s): multiple tools/resources provided in session  I discussed the assessment and treatment plan with the patient and/or parent/guardian. They were provided an opportunity to ask questions and all were answered. They agreed with the plan and demonstrated an understanding of the instructions.   They were advised to call back or seek an in-person evaluation if the  symptoms worsen or if the condition fails to improve as anticipated.  [name], LMFT

## 2020-10-14 NOTE — Telephone Encounter (Signed)
TC to patient, telephone appt offered for today at 2pm with Dr. Monna Fam (Per Dr. Gean Quint instructions), patient accepts the appointment time, verbalized understanding it was a telephone appt and is very appreciative. SChaplin, RN,BSN

## 2020-10-14 NOTE — Telephone Encounter (Signed)
Not sure of my schedule-reading this first. If I have a time this afternoon, that is fine-

## 2020-10-14 NOTE — Telephone Encounter (Signed)
Pls contact pt regarding her cancel appt today [phone]

## 2020-10-19 ENCOUNTER — Other Ambulatory Visit: Payer: Self-pay

## 2020-10-19 ENCOUNTER — Encounter: Payer: BC Managed Care – PPO | Admitting: Physical Therapy

## 2020-10-19 ENCOUNTER — Encounter: Payer: Self-pay | Admitting: Physical Therapy

## 2020-10-19 DIAGNOSIS — M6281 Muscle weakness (generalized): Secondary | ICD-10-CM | POA: Diagnosis not present

## 2020-10-19 DIAGNOSIS — R252 Cramp and spasm: Secondary | ICD-10-CM | POA: Diagnosis not present

## 2020-10-19 DIAGNOSIS — R278 Other lack of coordination: Secondary | ICD-10-CM

## 2020-10-19 DIAGNOSIS — R41841 Cognitive communication deficit: Secondary | ICD-10-CM | POA: Diagnosis not present

## 2020-10-19 NOTE — Therapy (Signed)
[facility] at [facility] [address], [address] [place], [place], [address] Phone: [phone]   Fax:  [phone]  Physical Therapy Treatment  Patient Details  Name: Brianna [name] MRN: [id number] Date of Birth: [birth year] Referring Provider (PT): Dr. Miguel Aschoff   Encounter Date: 10/19/2020   PT End of Session - 10/19/20 1025    Visit Number 3    Date for PT Re-Evaluation 02/07/21    Authorization Type [employer]    PT Start Time 0930    PT Stop Time 1015    PT Time Calculation (min) 45 min    Activity Tolerance Patient tolerated treatment well;Patient limited by pain    Behavior During Therapy [facility] for tasks assessed/performed           Past Medical History:  Diagnosis Date  . Asthma   . Bronchitis   . Depression     Past Surgical History:  Procedure Laterality Date  . WISDOM TOOTH EXTRACTION      There were no vitals filed for this visit.   Subjective Assessment - 10/19/20 0934    Subjective I am the same and fatiqued. Sleep is poor. I feel the left groin is spasm. My low back has been hurting.    Patient Stated Goals reduce pain    Currently in Pain? Yes    Pain Score 4     Pain Location Pelvis    Pain Orientation Right;Left    Pain Descriptors / Indicators Aching;Stabbing;Spasm    Pain Type Chronic pain    Pain Onset More than a month ago    Pain Frequency Constant    Aggravating Factors  walking, sleeping on side, sitting for long, driving    Pain Relieving Factors hot pack    Multiple Pain Sites Yes    Pain Score 4    Pain Location Back    Pain Orientation Lower    Pain Descriptors / Indicators Aching;Tightness    Pain Type Chronic pain    Pain Onset More than a month ago    Pain Frequency Constant    Aggravating Factors  siting and lean forward, sitting and walking for long, lifting    Pain Relieving Factors heat                             [facility] Adult PT Treatment/Exercise -  10/19/20 0001      Self-Care   Self-Care Other Self-Care Comments    Other Self-Care Comments  sitting on ball to massage the pelvic floor muscles trying different balls, massage the feet, rolling ball under gastroc and hamstring, gluteals, then tennis ball by subocciput to release the neck tension; education on pelivc meditation for body scanning and full body relaxation by Jan Fireman      Therapeutic Activites    Therapeutic Activities Other Therapeutic Activities    Other Therapeutic Activities how to sleep on her stomach to reduce straiin on her neck      Neuro Re-ed    Neuro Re-ed Details  diaphragmatic breathing to elongate the diaphragm and pelvic floor while manual work was performed      Manual Therapy   Manual Therapy Myofascial release    Myofascial Release fascial release through the layers for the thoracic inlet, relspiratory diaphragm and urogenital diaphragm                  PT Education - 10/19/20 1021  Education Details education on using tennis balls to relax her muscles and reduce pain, you tube video for body scanning and full baody relaxation    Methods Explanation;Handout    Comprehension Verbalized understanding            PT Short Term Goals - 10/19/20 1031      PT SHORT TERM GOAL #1   Title patient is independent with initial HEP    Time 4    Period Weeks    Status Achieved      PT SHORT TERM GOAL #2   Title patient is able to use her dilator/wand correctly to reduce trigger points    Time 4    Period Weeks    Status On-going      PT SHORT TERM GOAL #3   Title patient is able to perform diaphragmatic breathing to bulge the pelvic floor    Time 4    Period Weeks    Status On-going      PT SHORT TERM GOAL #4   Title patient understands ways to calm the nervous system to reduce her pain    Time 4    Period Weeks    Status On-going      PT SHORT TERM GOAL #5   Title patient reports her pain intensity is reduced 25% of the day     Time 4    Period Weeks    Status On-going             PT Long Term Goals - 10/07/20 1357      PT LONG TERM GOAL #1   Title Patient is independent with advanced HEP    Time 4    Period Months    Status New    Target Date 02/07/21      PT LONG TERM GOAL #2   Title pelvic pain decreased </= 3/10 50-60% of the time due to reduction of trigger points    Time 4    Period Months    Status New    Target Date 02/07/21      PT LONG TERM GOAL #3   Title patient is able to fully empty her bowels due to improve pelvic floor coordination during a bowel movement    Time 4    Period Months    Status New    Target Date 02/07/21      PT LONG TERM GOAL #4   Title patient is able to fully empty her bladder and is able to wait 2-3 hours to urinate due to relaxation of the pelvic floor    Time 4    Period Months    Status New    Target Date 02/07/21                 Plan - 10/19/20 1025    Clinical Impression Statement Patient is learning ways to manage her pain by using tennis balls to reduce trigger points, diaphragmatic breathing and meditation. Patient is very thin causing bruising around promient bones due to lack of adipose tissue. Patient pain levels continue to fluctuate depending on the day. It has not been reviewed on using her dialtor, but will be addressed next time. Patient still has difficulty feeling her pelvic floor relax wiht diaphragmatic breathing. She is expanding her abdomen more. Patient is not able to fully empty her bladder yet and sometimes has difficulty with bowel movements. Patient will benefit from skilled therapy to improve pelvic floor coordination and strength while  reducing her pain to improve daily lift tasks.    Personal Factors and Comorbidities Time since onset of injury/illness/exacerbation;Finances;Sex;Profession;Past/Current Experience    Examination-Activity Limitations Locomotion Level;Toileting;Stand    Examination-Participation Restrictions  Occupation;Community Activity;Interpersonal Relationship    Stability/Clinical Decision Making Stable/Uncomplicated    Rehab Potential Excellent    PT Frequency 1x / week    PT Duration Other (comment)   4 months   PT Treatment/Interventions ADLs/Self Care Home Management;Biofeedback;Cryotherapy;Electrical Stimulation;Iontophoresis 4mg /ml Dexamethasone;Moist Heat;Ultrasound;Neuromuscular re-education;Therapeutic exercise;Therapeutic activities;Patient/family education;Manual techniques;Dry needling;Taping;Spinal Manipulations;Joint Manipulations    PT Next Visit Plan diaphragmatic breathing to  bulge the pelvic floor; fascial release in the 3 diaphragms with heat on back;  Pelvic work, go over the wand, see if the meditation  by helps    PT Home Exercise Plan Access Code: [email or web address]    Consulted and Agree with Plan of Care Patient           Patient will benefit from skilled therapeutic intervention in order to improve the following deficits and impairments:  Decreased coordination,Increased fascial restricitons,Decreased endurance,Decreased activity tolerance,Dizziness,Pain,Decreased strength  Visit Diagnosis: Muscle weakness (generalized)  Cramp and spasm  Other lack of coordination  Cognitive communication deficit     Problem List Patient Active Problem List   Diagnosis Date Noted  . IBS (irritable bowel syndrome) 08/02/2020  . Pelvic floor dysfunction 08/02/2020  . Endometriosis 08/02/2020  . Irritant dermatitis 08/02/2020  . Birth control counseling 08/02/2020  . Gastroesophageal reflux disease 06/28/2020  . Severe recurrent major depression without psychotic features (HCC) 06/28/2020  . Multiple joint pain 06/28/2020  . 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 Tachycardia 06/28/2020  . History of COVID-19 06/28/2020  . 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 Atypical chest pain 06/22/2020  .  Dizziness 06/22/2020  . SOB (shortness of breath) 06/22/2020  . Palpitations 06/14/2020  . Anxiety 05/04/2020  . Internal and external hemorrhoids without complication 04/29/2020  . Pelvic prolapse 02/20/2020  . Acute recurrent pansinusitis 10/21/2019  . ETD (Eustachian tube dysfunction), bilateral 10/21/2019  . Cat allergy due to both airborne and skin contact 05/01/2016  . Chronic sore throat 05/01/2016  . Acne vulgaris 03/11/2014  . Mild intermittent asthma without complication 03/11/2014  . Seasonal allergic rhinitis 03/11/2014  . Seasonal allergies 03/11/2014    03/13/2014, PT 10/19/20 10:32 AM   [facility] at [facility] [address] [place], [place], [place] Phone: [phone]   Fax:  [phone]  Name: Brianna [name] MRN: [name] Date of Birth: 01-Dec-1995

## 2020-10-19 NOTE — Patient Instructions (Addendum)
   Body scanning by Jan Fireman   Full Body Relaxation by Danae Orleans, PT [facility] [address] [place], [place] [address] W: [phone] [name]

## 2020-10-20 DIAGNOSIS — R110 Nausea: Secondary | ICD-10-CM | POA: Diagnosis not present

## 2020-10-20 DIAGNOSIS — R109 Unspecified abdominal pain: Secondary | ICD-10-CM | POA: Diagnosis not present

## 2020-10-20 DIAGNOSIS — K30 Functional dyspepsia: Secondary | ICD-10-CM | POA: Diagnosis not present

## 2020-10-20 DIAGNOSIS — R6881 Early satiety: Secondary | ICD-10-CM | POA: Diagnosis not present

## 2020-10-21 NOTE — Progress Notes (Signed)
Office Visit Note  Patient: Brianna [name]             Date of Birth: February 05, 1996           MRN: [id number]             PCP: Merrilyn Puma, MD Referring: Anne Shutter, MD Visit Date: 10/22/2020   Subjective:  New Patient (Initial Visit) (Shooting nerve pain, spasms, [facility] x 3 weeks, pelvic flood dis function 04/2020, [facility] performed a genes sequence test, hair loss, hormone lab elevated, )   History of Present Illness: Brianna [name] is a 25 y.o. 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 Some symptoms have been chronic and she reports a history of joint pains and GI issues since she was in elementary school. She has some history of hypermobility without major injuries, and endoscopy has revealed hemorrhoids without any other pathology. However now many problems are worse including severe weight loss and GI problems starting around September of last year with fairly precipitous worsening and have continued since then. She does recall personal life stressors were increased at that time, but her symptoms have remained persistent. She has tried multiple treatments so far including several SSRIs, wellbutrin, duloxetine not tolerable on account of severe night sweat side effects. She was recent recommended trial of amitriptyline. 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 Chronic GI problems with mixed constipation and diarrhea she has undergone recent motility study with follow up and plan with her  gastroenterologist pending.  Activities of Daily Living:  Patient reports morning stiffness for 24 hours.   Patient Reports nocturnal pain.  Difficulty dressing/grooming: Denies Difficulty climbing stairs: Denies Difficulty getting out of chair: Denies Difficulty using hands for taps, buttons, cutlery, and/or writing: Denies  Review of Systems  Constitutional:  Positive for fatigue.  HENT:  Positive for mouth dryness.   Eyes:  Negative for dryness.  Respiratory:  Positive for shortness of breath.   Cardiovascular:  Positive for swelling in legs/feet.  Gastrointestinal:  Positive for constipation and diarrhea.  Endocrine: Positive for cold intolerance, heat intolerance, excessive thirst and increased urination.  Genitourinary:  Positive for difficulty urinating.  Musculoskeletal:  Positive for joint pain, gait problem, joint pain, muscle weakness, morning stiffness and muscle tenderness.  Skin:  Negative for rash.  Allergic/Immunologic: Positive for susceptible to infections.  Neurological:  Positive for weakness.  Hematological:  Positive for bruising/bleeding tendency.  Psychiatric/Behavioral:  Positive for sleep disturbance.    PMFS History:  Patient Active Problem List   Diagnosis Date Noted   Muscle spasticity 10/22/2020   Rash and other nonspecific skin eruption 10/22/2020   Paresthesia of bilateral legs 10/06/2020   Myalgia 10/06/2020   Chronic pelvic pain in female 10/06/2020   Chronic abdominal pain 10/06/2020   IBS (irritable bowel syndrome) 08/02/2020   Pelvic floor dysfunction 08/02/2020   Irritant dermatitis 08/02/2020   Gastroesophageal reflux disease 06/28/2020   Severe recurrent major depression without psychotic features (HCC) 06/28/2020   Polyarthralgia 06/28/2020   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 06/28/2020   Tachycardia 06/28/2020   History of COVID-19 06/28/2020   COVID-19 long  hauler manifesting chronic concentration deficit  06/28/2020   Recurrent major depressive disorder, in full remission (HCC) 06/28/2020   Atypical chest pain 06/22/2020   Dizziness 06/22/2020   SOB (shortness of breath) 06/22/2020   Palpitations 06/14/2020   Anxiety 05/04/2020   Internal and external hemorrhoids without complication 04/29/2020   Acute recurrent pansinusitis 10/21/2019   ETD (Eustachian tube dysfunction), bilateral 10/21/2019   Cat allergy due to both airborne and skin contact 05/01/2016   Chronic sore throat 05/01/2016   Acne vulgaris 03/11/2014   Mild intermittent asthma without complication 03/11/2014   Seasonal allergic rhinitis 03/11/2014   Seasonal allergies 03/11/2014    Past Medical History:  Diagnosis Date   Asthma    Bronchitis    Depression    Gastric paresis    Keratosis pilaris     Family History  Problem Relation Age of Onset   Diabetes Mother    Polycystic ovary syndrome Mother    GER disease Father    Heart disease Paternal Uncle    Past Surgical History:  Procedure Laterality Date   WISDOM TOOTH EXTRACTION     Social History   Social History Narrative   Not on file   Immunization History  Administered Date(s) Administered   HPV Quadrivalent 09/04/2012   Influenza Split 02/20/2020   Influenza,inj,Quad PF,6+ Mos 05/02/2015, 03/17/2019, 03/07/2020   PFIZER(Purple Top)SARS-COV-2 Vaccination 07/31/2019, 08/25/2019, 02/20/2020, 03/07/2020   Tdap 12/10/2014, 03/17/2019     Objective: Vital Signs: BP 101/66 (BP Location: Right Arm, Patient Position: Sitting, Cuff Size: Normal)   Pulse 91   Resp 14   Ht 5\' 4"  (1.626 m)   Wt 105 lb (47.6 kg)   LMP 10/12/2020   BMI 18.02 kg/m    Physical Exam Constitutional:      Comments: underweight  HENT:     Right Ear: External ear normal.     Left Ear: External ear normal.     Mouth/Throat:     Mouth: Mucous membranes are moist.     Pharynx: Oropharynx is clear.  Eyes:     Conjunctiva/sclera: Conjunctivae normal.  Cardiovascular:      Rate and Rhythm: Normal rate and regular rhythm.  Pulmonary:     Effort: Pulmonary effort is normal.     Breath sounds: Normal breath sounds.  Abdominal:     General: Bowel sounds are normal.  Skin:    General: Skin is warm and dry.     Findings: No rash.  Neurological:     General: No focal deficit present.     Mental Status: She is alert.  Psychiatric:        Mood and Affect: Mood normal.     Musculoskeletal Exam:  Neck full ROM no tenderness Shoulders full ROM no tenderness or swelling Elbows full ROM no tenderness or swelling Wrists full ROM no tenderness or swelling Fingers full ROM no tenderness or swelling Tenderness to pressure over lumbosacral paraspinal muscles Knees full ROM no tenderness or swelling Ankles full ROM no tenderness or swelling   Investigation: No additional findings.  Imaging: No results found.  Recent Labs: Lab Results  Component Value Date   WBC 4.8 09/30/2020   HGB 13.2 09/30/2020   PLT 234 09/30/2020   NA 136 10/31/2016   K 3.7 10/31/2016   CL 106 10/31/2016   CO2 22 10/31/2016   GLUCOSE 87 10/31/2016   BUN 10 10/31/2016   CREATININE 0.70 10/31/2016   BILITOT 0.6 10/31/2016   ALKPHOS 53 10/31/2016  AST 19 10/31/2016   ALT 18 10/31/2016   PROT 7.3 10/31/2016   ALBUMIN 4.2 10/31/2016   CALCIUM 9.5 10/31/2016   GFRAA >60 10/31/2016    Speciality Comments: No specialty comments available.  Procedures:  No procedures performed Allergies: Clindamycin   Assessment / Plan:     Visit Diagnoses: Myalgia Tachycardia Polyarthralgia Muscle spasticity Irritable bowel syndrome with both constipation and diarrhea 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 deficit Rash and other nonspecific skin eruption  She presents with numerous symptoms all largely increased during the past approximately 8 months. These together are clearly somewhat debilitating and interfering with regular activity. Most prominent seems to be  gastrointestinal manifestations considering she is considerably underweight GI workup is ongoing already. There could be some somatization with previous depressive mood and reporting stressful event last year but does not seem likely to explain numerous problems. Small fiber neuropathy vs FMS also possible as a major contributor to pain and shooting pain and spasticity. Localization of pain on exam seems to be around low back paraspinal muscles today maybe a mechanical process for this local component. Dysautonomia is suggested if motility problem is detected, although her episodes of tachycardia were apparently negative for POTS or any unusual cardiac workup. Prior rheumatology lab screening was negative, but considering this whole constellation of problems will recommend [facility] CTD testing. Will plan to follow up after seeing this.  Orders: No orders of the defined types were placed in this encounter.  No orders of the defined types were placed in this encounter.    Follow-Up Instructions: Return in about 3 weeks (around 11/12/2020) for New pt f/u [facility] labs.   Fuller Plan, MD  Note - This record has been created using [employer].  Chart creation errors have been sought, but may not always  have been located. Such creation errors do not reflect on  the standard of medical care.

## 2020-10-22 ENCOUNTER — Encounter: Payer: Self-pay | Admitting: Internal Medicine

## 2020-10-22 ENCOUNTER — Other Ambulatory Visit: Payer: Self-pay

## 2020-10-22 ENCOUNTER — Ambulatory Visit (INDEPENDENT_AMBULATORY_CARE_PROVIDER_SITE_OTHER): Payer: BC Managed Care – PPO | Admitting: Internal Medicine

## 2020-10-22 VITALS — BP 101/66 | HR 91 | Resp 14 | Ht 64.0 in | Wt 105.0 lb

## 2020-10-22 DIAGNOSIS — M62838 Other muscle spasm: Secondary | ICD-10-CM | POA: Diagnosis not present

## 2020-10-22 DIAGNOSIS — M2550 Pain in unspecified joint: Secondary | ICD-10-CM

## 2020-10-22 DIAGNOSIS — R21 Rash and other nonspecific skin eruption: Secondary | ICD-10-CM

## 2020-10-22 DIAGNOSIS — M791 Myalgia, unspecified site: Secondary | ICD-10-CM | POA: Diagnosis not present

## 2020-10-22 DIAGNOSIS — U099 Post covid-19 condition, unspecified: Secondary | ICD-10-CM

## 2020-10-22 DIAGNOSIS — R000 Tachycardia, unspecified: Secondary | ICD-10-CM

## 2020-10-22 DIAGNOSIS — K582 Mixed irritable bowel syndrome: Secondary | ICD-10-CM

## 2020-10-22 DIAGNOSIS — R41840 Attention and concentration deficit: Secondary | ICD-10-CM

## 2020-10-22 DIAGNOSIS — Q7962 Hypermobile Ehlers-Danlos syndrome: Secondary | ICD-10-CM | POA: Insufficient documentation

## 2020-10-22 NOTE — Patient Instructions (Addendum)
I recommend checking out the [facility] patient-centered guide for fibromyalgia and chronic pain management: https://howell-gardner.net/   Myofascial Pain Syndrome and Fibromyalgia Myofascial pain syndrome and fibromyalgia are both pain disorders. This pain may be felt mainly in your muscles.  Myofascial pain syndrome: ? Always has tender points in the muscle that will cause pain when pressed (trigger points). The pain may come and go. ? Usually affects your neck, upper back, and shoulder areas. The pain often radiates into your arms and hands.  Fibromyalgia: ? Has muscle pains and tenderness that come and go. ? Is often associated with fatigue and sleep problems. ? Has trigger points. ? Tends to be long-lasting (chronic), but is not life-threatening. Fibromyalgia and myofascial pain syndrome are not the same. However, they often occur together. If you have both conditions, each can make the other worse. Both are common and can cause enough pain and fatigue to make day-to-day activities difficult. Both can be hard to diagnose because their symptoms are common in many other conditions. What are the causes? The exact causes of these conditions are not known. What increases the risk? You are more likely to develop this condition if:  You have a family history of the condition.  You have certain triggers, such as: ? Spine disorders. ? An injury (trauma) or other physical stressors. ? Being under a lot of stress. ? Medical conditions such as osteoarthritis, rheumatoid arthritis, or lupus. What are the signs or symptoms? Fibromyalgia The main symptom of fibromyalgia is widespread pain and tenderness in your muscles. Pain is sometimes described as stabbing, shooting, or burning. You may also have:  Tingling or numbness.  Sleep problems and fatigue.  Problems with attention and concentration (fibro fog). Other symptoms may include:  Bowel and bladder  problems.  Headaches.  Visual problems.  Problems with odors and noises.  Depression or mood changes.  Painful menstrual periods (dysmenorrhea).  Dry skin or eyes. These symptoms can vary over time. Myofascial pain syndrome Symptoms of myofascial pain syndrome include:  Tight, ropy bands of muscle.  Uncomfortable sensations in muscle areas. These may include aching, cramping, burning, numbness, tingling, and weakness.  Difficulty moving certain parts of the body freely (poor range of motion). How is this diagnosed? This condition may be diagnosed by your symptoms and medical history. You will also have a physical exam. In general:  Fibromyalgia is diagnosed if you have pain, fatigue, and other symptoms for more than 3 months, and symptoms cannot be explained by another condition.  Myofascial pain syndrome is diagnosed if you have trigger points in your muscles, and those trigger points are tender and cause pain elsewhere in your body (referred pain). How is this treated? Treatment for these conditions depends on the type that you have.  For fibromyalgia: ? Pain medicines, such as NSAIDs. ? Medicines for treating depression. ? Medicines for treating seizures. ? Medicines that relax the muscles.  For myofascial pain: ? Pain medicines, such as NSAIDs. ? Cooling and stretching of muscles. ? Trigger point injections. ? Sound wave (ultrasound) treatments to stimulate muscles. Treating these conditions often requires a team of health care providers. These may include:  Your primary care provider.  Physical therapist.  Complementary health care providers, such as massage therapists or acupuncturists.  Psychiatrist for cognitive behavioral therapy.   Follow these instructions at home: Medicines  Take over-the-counter and prescription medicines only as told by your health care provider.  Do not drive or use heavy machinery while taking  prescription pain medicine.  If  you are taking prescription pain medicine, take actions to prevent or treat constipation. Your health care provider may recommend that you: ? Drink enough fluid to keep your urine pale yellow. ? Eat foods that are high in fiber, such as fresh fruits and vegetables, whole grains, and beans. ? Limit foods that are high in fat and processed sugars, such as fried or sweet foods. ? Take an over-the-counter or prescription medicine for constipation. Lifestyle  Exercise as directed by your health care provider or physical therapist.  Practice relaxation techniques to control your stress. You may want to try: ? Biofeedback. ? Visual imagery. ? Hypnosis. ? Muscle relaxation. ? Yoga. ? Meditation.  Maintain a healthy lifestyle. This includes eating a healthy diet and getting enough sleep.  Do not use any products that contain nicotine or tobacco, such as cigarettes and e-cigarettes. If you need help quitting, ask your health care provider.   General instructions  Talk to your health care provider about complementary treatments, such as acupuncture or massage.  Consider joining a support group with others who are diagnosed with this condition.  Do not do activities that stress or strain your muscles. This includes repetitive motions and heavy lifting.  Keep all follow-up visits as told by your health care provider. This is important. Where to find more information  National Fibromyalgia Association: www.fmaware.org  Arthritis Foundation: www.arthritis.org  American Chronic Pain Association: www.theacpa.org Contact a health care provider if:  You have new symptoms.  Your symptoms get worse or your pain is severe.  You have side effects from your medicines.  You have trouble sleeping.  Your condition is causing depression or anxiety. Summary  Myofascial pain syndrome and fibromyalgia are pain disorders.  Myofascial pain syndrome has tender points in the muscle that will cause  pain when pressed (trigger points). Fibromyalgia also has muscle pains and tenderness that come and go, but this condition is often associated with fatigue and sleep disturbances.  Fibromyalgia and myofascial pain syndrome are not the same but often occur together, causing pain and fatigue that make day-to-day activities difficult.  Treatment for fibromyalgia includes taking medicines to relax the muscles and medicines for pain, depression, or seizures. Treatment for myofascial pain syndrome includes taking medicines for pain, cooling and stretching of muscles, and injecting medicines into trigger points.  Follow your health care provider's instructions for taking medicines and maintaining a healthy lifestyle. This information is not intended to replace advice given to you by your health care provider. Make sure you discuss any questions you have with your health care provider. Document Revised: 08/30/2018 Document Reviewed: 05/23/2017 Elsevier Patient Education  2021 [employer].

## 2020-10-25 ENCOUNTER — Ambulatory Visit: Payer: BC Managed Care – PPO | Admitting: Behavioral Health

## 2020-10-25 ENCOUNTER — Other Ambulatory Visit: Payer: Self-pay

## 2020-10-25 DIAGNOSIS — F419 Anxiety disorder, unspecified: Secondary | ICD-10-CM

## 2020-10-25 DIAGNOSIS — L658 Other specified nonscarring hair loss: Secondary | ICD-10-CM | POA: Diagnosis not present

## 2020-10-25 DIAGNOSIS — F332 Major depressive disorder, recurrent severe without psychotic features: Secondary | ICD-10-CM

## 2020-10-25 DIAGNOSIS — L659 Nonscarring hair loss, unspecified: Secondary | ICD-10-CM | POA: Diagnosis not present

## 2020-10-25 NOTE — BH Specialist Note (Signed)
Integrated Behavioral Health via Telemedicine Visit  10/25/2020 Brianna [name] [id number]  Number of Integrated Behavioral Health visits: 5/6 Session Start time: 11:00am  Session End time: 11:50pm Total time: 50   Referring Provider: Dr. Merrilyn Puma, MD Patient/Family location: Pt @ home in private [facility] Provider location: [facility] Office All persons participating in visit: Pt & Clinician  Types of Service: Individual psychotherapy  I connected with [name] and/or [name]'s self via  Telephone or Engineer, civil (consulting)  (Video is Surveyor, mining) and verified that I am speaking with the correct person using two identifiers. Discussed confidentiality: Yes   I discussed the limitations of telemedicine and the availability of in person appointments.  Discussed there is a possibility of technology failure and discussed alternative modes of communication if that failure occurs.  I discussed that engaging in this telemedicine visit, they consent to the provision of behavioral healthcare and the services will be billed under their insurance.  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  Goals Addressed: Patient will: 1.  Reduce symptoms of: anxiety,  depression, mood instability and stress  2.  Increase knowledge and/or ability of: coping skills, healthy habits, self-management skills and stress reduction  3.  Demonstrate ability to: Increase healthy adjustment to current life circumstances  Progress towards Goals: Ongoing  Interventions: Interventions utilized:  Mining engineer, CBT Cognitive Behavioral Therapy, Sleep Hygiene and DBT Dialectal Behavioral Therapy Standardized Assessments completed: screeners prn  Patient and/or Family Response: Pt receptive to call today & wants future appt  Assessment: Patient currently experiencing Pt has improved sleep in the past week; she is getting 6-7 hr/night, but still w/wakings.   Patient may benefit from further sleep hygiene eval, emot'l regulation skills to benefit relational efforts. Pt is still, "not eating that great." She still sts, "I am overwhelmed." Pt lost 2# @ her last Dr visit.   Plan: 1. Follow up with behavioral health clinician on : 2-3 wks for 60 min telehealth 2. Behavioral recommendations: Plan your week on a calendar where you can visually see it displayed in the home. Try creating a routine/schedule that helps promote feelings of success. Try connecting to friends more to feel less isolated & receive pos feedback 3. Referral(s): Integrated [employer] (In Clinic)  I discussed the assessment and treatment plan with the patient and/or parent/guardian. They were provided an opportunity to ask questions and all were answered. They agreed with the plan and demonstrated an understanding of the instructions.   They were advised to call back or seek an in-person evaluation if the symptoms worsen or if the condition fails to improve as anticipated.  [name], LMFT

## 2020-10-26 ENCOUNTER — Encounter: Payer: Self-pay | Admitting: Internal Medicine

## 2020-10-26 DIAGNOSIS — M791 Myalgia, unspecified site: Secondary | ICD-10-CM | POA: Diagnosis not present

## 2020-10-26 DIAGNOSIS — M2550 Pain in unspecified joint: Secondary | ICD-10-CM | POA: Diagnosis not present

## 2020-10-26 DIAGNOSIS — R21 Rash and other nonspecific skin eruption: Secondary | ICD-10-CM | POA: Diagnosis not present

## 2020-10-26 DIAGNOSIS — M62838 Other muscle spasm: Secondary | ICD-10-CM | POA: Diagnosis not present

## 2020-10-27 ENCOUNTER — Other Ambulatory Visit: Payer: Self-pay

## 2020-10-27 ENCOUNTER — Encounter: Payer: Self-pay | Admitting: Physical Therapy

## 2020-10-27 ENCOUNTER — Ambulatory Visit: Payer: BC Managed Care – PPO | Attending: Internal Medicine | Admitting: Physical Therapy

## 2020-10-27 DIAGNOSIS — R252 Cramp and spasm: Secondary | ICD-10-CM | POA: Insufficient documentation

## 2020-10-27 DIAGNOSIS — M6281 Muscle weakness (generalized): Secondary | ICD-10-CM | POA: Diagnosis not present

## 2020-10-27 DIAGNOSIS — R278 Other lack of coordination: Secondary | ICD-10-CM | POA: Diagnosis not present

## 2020-10-27 NOTE — Patient Instructions (Signed)
Access Code: [email or web address] URL: [email or web address] Date: 10/27/2020 Prepared by: Eulis Foster  Exercises Hip Hinge Rock Back - 1 x daily - 7 x weekly - 1 sets - 10 reps Standing Hip Hinge - 1 x daily - 7 x weekly - 1 sets - 10 reps  [facility] [address] [place], [place] [address] Phone # [phone] Fax [phone]

## 2020-10-27 NOTE — Therapy (Signed)
[facility] [address], [address] [place], [place], [address] Phone: [phone]   Fax:  [phone]  Physical Therapy Treatment  Patient Details  Name: Brianna [name] MRN: [id number] Date of Birth: 1996/01/23 Referring Provider (PT): Dr. Miguel Aschoff   Encounter Date: 10/27/2020   PT End of Session - 10/27/20 0831    Visit Number 4    Date for PT Re-Evaluation 02/07/21    Authorization Type BCBS    PT Start Time 0815    PT Stop Time 0845    PT Time Calculation (min) 30 min    Activity Tolerance Patient tolerated treatment well;Patient limited by pain    Behavior During Therapy [facility] for tasks assessed/performed           Past Medical History:  Diagnosis Date  . Asthma   . Bronchitis   . Depression     Past Surgical History:  Procedure Laterality Date  . WISDOM TOOTH EXTRACTION      There were no vitals filed for this visit.   Subjective Assessment - 10/27/20 0817    Subjective Some days I have better days than others, pelvic pain is still present but not as bad but back pain is still present.    Patient Stated Goals reduce pain    Currently in Pain? Yes    Pain Score 4     Pain Location Pelvis    Pain Orientation Right;Left    Pain Descriptors / Indicators Aching;Stabbing;Spasm    Pain Type Chronic pain    Pain Onset More than a month ago    Pain Frequency Constant    Aggravating Factors  walking, sleeping on side, sitting for long, driving    Pain Relieving Factors hot pack    Multiple Pain Sites Yes    Pain Score 4    Pain Location Back    Pain Orientation Lower    Pain Descriptors / Indicators Aching;Tightness    Pain Type Chronic pain    Pain Onset More than a month ago    Pain Frequency Constant    Aggravating Factors  siting and lean forward, sitting and walking for long, lifting    Pain Relieving Factors heat                          Pelvic Floor Special  Questions - 10/27/20 0001    Pelvic Floor Internal Exam Patient confirms identification and approves PT to assess pelvic floor and treatment    Exam Type Vaginal    Palpation tenderness found Bil with Rt>Lt at bulbocavernous, ischiocavernous and at perineal body             [facility] Adult PT Treatment/Exercise - 10/27/20 0001      Posture/Postural Control   Posture/Postural Control Postural limitations    Postural Limitations Rounded Shoulders;Posterior pelvic tilt;Increased thoracic kyphosis    Posture Comments pt presented with poor sitting posture. visual dem and tactile cues for improved posture in sitting      Exercises   Exercises Lumbar      Lumbar Exercises: Standing   Other Standing Lumbar Exercises hip hinge against wall for improved support and feedback for proper technique. x10      Lumbar Exercises: Quadruped   Other Quadruped Lumbar Exercises anterior/posterior rocking with tactile cues for neutral spine and hips in neutral alignment to decrease compensatory strategies to Rt side and rounding lumbar spine. improved ability to perform with lesser  cues within good technique      Manual Therapy   Manual Therapy Myofascial release    Myofascial Release fascial release through Rt and Lt bulbocavernous and ischiocavernous                  PT Education - 10/27/20 0820    Education Details Pt educated on importance on proper technique with sitting posture for alignment and positioning of back and pelvic floor. Access Code: [email or web address]    Person(s) Educated Patient    Methods Explanation;Demonstration;Handout    Comprehension Verbalized understanding;Returned demonstration            PT Short Term Goals - 10/27/20 0846      PT SHORT TERM GOAL #1   Title patient is independent with initial HEP    Time 4    Period Weeks    Status Achieved      PT SHORT TERM GOAL #2   Title patient is able to use her dilator/wand correctly to reduce trigger points    Time 4     Period Weeks    Status On-going      PT SHORT TERM GOAL #3   Title patient is able to perform diaphragmatic breathing to bulge the pelvic floor    Time 4    Period Weeks    Status On-going      PT SHORT TERM GOAL #4   Title patient understands ways to calm the nervous system to reduce her pain    Time 4    Period Weeks    Status Achieved      PT SHORT TERM GOAL #5   Title patient reports her pain intensity is reduced 25% of the day    Time 4    Period Weeks    Status On-going             PT Long Term Goals - 10/07/20 1357      PT LONG TERM GOAL #1   Title Patient is independent with advanced HEP    Time 4    Period Months    Status New    Target Date 02/07/21      PT LONG TERM GOAL #2   Title pelvic pain decreased </= 3/10 50-60% of the time due to reduction of trigger points    Time 4    Period Months    Status New    Target Date 02/07/21      PT LONG TERM GOAL #3   Title patient is able to fully empty her bowels due to improve pelvic floor coordination during a bowel movement    Time 4    Period Months    Status New    Target Date 02/07/21      PT LONG TERM GOAL #4   Title patient is able to fully empty her bladder and is able to wait 2-3 hours to urinate due to relaxation of the pelvic floor    Time 4    Period Months    Status New    Target Date 02/07/21                 Plan - 10/27/20 0826    Clinical Impression Statement Pt presents today with reported continued back pain 4/10 however reports slightly improved pelvic pain. Pt found to have poor sitting and limited standing posture upone treatment, educated with visual demonstration and reps in quad and standig for improved hip hinging for neutral spine and decreased  compensatory strategies. Pt also agreed to internal vaginal treatment work, findinges of tenderness at right and left with left>right bulbocavernous and iscocavernous, improved with manual work with myofasical release.    Personal  Factors and Comorbidities Time since onset of injury/illness/exacerbation;Finances;Sex;Profession;Past/Current Experience    Examination-Activity Limitations Locomotion Level;Toileting;Stand    Rehab Potential Excellent    PT Frequency 1x / week    PT Duration Other (comment)   4 months   PT Treatment/Interventions ADLs/Self Care Home Management;Biofeedback;Cryotherapy;Electrical Stimulation;Iontophoresis 4mg /ml Dexamethasone;Moist Heat;Ultrasound;Neuromuscular re-education;Therapeutic exercise;Therapeutic activities;Patient/family education;Manual techniques;Dry needling;Taping;Spinal Manipulations;Joint Manipulations    PT Next Visit Plan diaphragmatic breathing to  bulge the pelvic floor;   Pelvic work, go over the wand with pt's personal work,    PT Home Exercise Plan Access Code: [email or web address]    Consulted and Agree with Plan of Care Patient           Patient will benefit from skilled therapeutic intervention in order to improve the following deficits and impairments:  Decreased coordination,Increased fascial restricitons,Decreased endurance,Decreased activity tolerance,Dizziness,Pain,Decreased strength  Visit Diagnosis: Muscle weakness (generalized)  Cramp and spasm  Other lack of coordination     Problem List Patient Active Problem List   Diagnosis Date Noted  . Muscle spasticity 10/22/2020  . Rash and other nonspecific skin eruption 10/22/2020  . Paresthesia of bilateral legs 10/06/2020  . Myalgia 10/06/2020  . Chronic pelvic pain in female 10/06/2020  . Chronic abdominal pain 10/06/2020  . IBS (irritable bowel syndrome) 08/02/2020  . Pelvic floor dysfunction 08/02/2020  . Irritant dermatitis 08/02/2020  . Gastroesophageal reflux disease 06/28/2020  . Severe recurrent major depression without psychotic features (HCC) 06/28/2020  . Polyarthralgia 06/28/2020  . 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 Tachycardia 06/28/2020  . History of COVID-19  06/28/2020  . 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 Recurrent major depressive disorder, in full remission (HCC) 06/28/2020  . Atypical chest pain 06/22/2020  . Dizziness 06/22/2020  . SOB (shortness of breath) 06/22/2020  . Palpitations 06/14/2020  . Anxiety 05/04/2020  . Internal and external hemorrhoids without complication 04/29/2020  . Acute recurrent pansinusitis 10/21/2019  . ETD (Eustachian tube dysfunction), bilateral 10/21/2019  . Cat allergy due to both airborne and skin contact 05/01/2016  . Chronic sore throat 05/01/2016  . Acne vulgaris 03/11/2014  . Mild intermittent asthma without complication 03/11/2014  . Seasonal allergic rhinitis 03/11/2014  . Seasonal allergies 03/11/2014    03/13/2014, PT 10/27/20 8:47 AM   [facility] [address], [address] [place], [place], [place] Phone: [phone]   Fax:  [phone]  Name: Brianna [name] MRN: [name] Date of Birth: January 14, 1996

## 2020-10-28 ENCOUNTER — Other Ambulatory Visit: Payer: Self-pay

## 2020-10-28 ENCOUNTER — Ambulatory Visit (INDEPENDENT_AMBULATORY_CARE_PROVIDER_SITE_OTHER): Payer: BC Managed Care – PPO | Admitting: Obstetrics and Gynecology

## 2020-10-28 ENCOUNTER — Encounter: Payer: Self-pay | Admitting: Obstetrics and Gynecology

## 2020-10-28 ENCOUNTER — Encounter: Payer: Self-pay | Admitting: Neurology

## 2020-10-28 VITALS — BP 116/68 | HR 81 | Resp 16 | Ht 64.0 in | Wt 104.0 lb

## 2020-10-28 DIAGNOSIS — L659 Nonscarring hair loss, unspecified: Secondary | ICD-10-CM | POA: Diagnosis not present

## 2020-10-28 DIAGNOSIS — Z842 Family history of other diseases of the genitourinary system: Secondary | ICD-10-CM

## 2020-10-28 DIAGNOSIS — R519 Headache, unspecified: Secondary | ICD-10-CM | POA: Diagnosis not present

## 2020-10-28 NOTE — Progress Notes (Signed)
Pt states she had pap 10/21 @ [employer]

## 2020-10-28 NOTE — Progress Notes (Signed)
GYNECOLOGY OFFICE NOTE  History:  25 y.o. No obstetric history on file. here today for 2nd opinion. She is concerned she has PCOS and was told by primary gynecologist that she does not. She has also had multiple other medical issues diagnosed recently as well as other symptoms that she is concerned about. Recently diagnosed with gastroparesis for which she was started on reglan 2 days ago. Also has some depression, has not found effective treatment for depression yet. Has had significant weight loss and hair loss and a tentative diagnosis of fibromyalgia. Also has acne around mouth that has worsened as an adult.  Reports she was on OCPs starting age 56, was on Lo Loestrin for 4 years, then switched to an IUD, the [place]. Also was on Yaz at some point. Has not been on contraception since 07/2019.  Periods are every 35-40 days and are light. They were longer cycles prior to stopping contraception but have been regular since she went off the Yaz. Does have period cramping prior to and during periods. Uses 3 light pads/tampons per day.  Does not want to be on medication if she does not need to be. Currently on spironolactone for hair loss. Had some labs done at other offices.   Past Medical History:  Diagnosis Date   Asthma    Bronchitis    Depression    Gastric paresis    Keratosis pilaris     Past Surgical History:  Procedure Laterality Date   WISDOM TOOTH EXTRACTION       Current Outpatient Medications:    albuterol (VENTOLIN HFA) 108 (90 Base) MCG/ACT inhaler, Inhale into the lungs., Disp: , Rfl:    ASHWAGANDHA PO, Take by mouth., Disp: , Rfl:    B Complex Vitamins (VITAMIN B COMPLEX PO), Take by mouth., Disp: , Rfl:    busPIRone (BUSPAR) 5 MG tablet, Take 1 tablet (5 mg total) by mouth 3 (three) times daily as needed., Disp: 90 tablet, Rfl: 3   cetirizine (ZYRTEC) 10 MG tablet, Take by mouth as needed., Disp: , Rfl:    cholecalciferol (VITAMIN D) 25 MCG (1000 UNIT) tablet, Take  by mouth., Disp: , Rfl:    clonazePAM (KLONOPIN) 0.5 MG tablet, Take by mouth daily., Disp: , Rfl:    cyclobenzaprine (FLEXERIL) 10 MG tablet, cyclobenzaprine 10 mg tablet  TAKE 1 TABLET BY MOUTH TWICE A DAY AS NEEDED, Disp: , Rfl:    cyclobenzaprine (FLEXERIL) 5 MG tablet, Take 5 mg by mouth 3 (three) times daily as needed for muscle spasms., Disp: , Rfl:    Ferrous Sulfate Dried (EQ SLOW-RELEASE IRON) 45 MG TBCR, Take 1 tablet by mouth every other day., Disp: , Rfl:    fluticasone (FLONASE) 50 MCG/ACT nasal spray, Flonase 50 mcg/actuation nasal spray,suspension  Spray 1 spray every day by intranasal route., Disp: , Rfl:    loratadine (CLARITIN) 10 MG tablet, loratadine 10 mg tablet, Disp: , Rfl:    metoCLOPramide (REGLAN) 5 MG tablet, Take by mouth., Disp: , Rfl:    Multiple Vitamin (MULTIVITAMIN) tablet, Take 1 tablet by mouth daily., Disp: , Rfl:    spironolactone (ALDACTONE) 50 MG tablet, Take by mouth., Disp: , Rfl:    tretinoin (RETIN-A) 0.025 % cream, APPLY TO AFFECTED AREA EVERY DAY AT BEDTIME, Disp: , Rfl:    triamcinolone cream (KENALOG) 0.1 %, as needed., Disp: , Rfl:   The following portions of the patient's history were reviewed and updated as appropriate: allergies, current medications, past family history, past  medical history, past social history, past surgical history and problem list.   Review of Systems:  Pertinent items noted in HPI and remainder of comprehensive ROS otherwise negative.   Objective:  Physical Exam BP 116/68   Pulse 81   Resp 16   Ht 5\' 4"  (1.626 m)   Wt 104 lb (47.2 kg)   LMP 10/12/2020   BMI 17.85 kg/m  CONSTITUTIONAL: Well-developed, well-nourished female in no acute distress.  SKIN: Skin is warm and dry. No rash noted. Not diaphoretic. No erythema. No pallor. NEUROLOGIC: Alert and oriented to person, place, and time. Normal reflexes, muscle tone coordination. No cranial nerve deficit noted. PSYCHIATRIC: Normal mood and affect. Normal behavior.  Normal judgment and thought content. CARDIOVASCULAR: Normal heart rate noted RESPIRATORY: Effort normal, no problems with respiration noted ABDOMEN: Soft, no distention noted. No hirsutism noted. PELVIC: deferred MUSCULOSKELETAL: Normal range of motion. No edema noted.   Labs and Imaging No results found.  Assessment & Plan:  1. Family history of PCOS - Reviewed [place] criteria with patient, she does have mildly elevated DHEA but normal testosterone, no evidence of hirsutism and no evidence of polycystic ovaries on 10/14/2020, does not meet these criteria for PCOS - she would like further testing for insulin resistance given family history of PCOS, see below.  - 2Hr GTT w/ 1 Hr Carpenter 75 g; Future - DHEA-sulfate; Future - 17-Hydroxyprogesterone; Future - would not recommend going on contraception at this point for hair loss or emotional dysregulation, had worsening acne on OCPs/IUD in past and suspect she would have same effect this time - encouraged her to follow up with psych for depression symptoms  2. Hair loss - Cont spironolactone and follow up with derm - suspect component of poor nutritional status given recent weight loss and diagnosis of gastroparesis, encouraged follow up with GI and 2nd opinion if no improvement  3. Nonintractable headache, unspecified chronicity pattern, unspecified headache type - Patient does report new onset headaches since March 2022 with vertigo, as she has not had workup for this before, referral to neuro placed.  - would hold off on estrogen containing contraception until diagnosis of headache or migraine with/without aura made - Ambulatory referral to Neurology   Routine preventative health maintenance measures emphasized. Please refer to After Visit Summary for other counseling recommendations.   Return in about 3 months (around 01/28/2021) for Followup.   03/30/2021, MD, [facility] Attending Center for [facility])

## 2020-10-29 ENCOUNTER — Telehealth: Payer: Self-pay | Admitting: *Deleted

## 2020-10-29 NOTE — Telephone Encounter (Signed)
Returned call from 8:48 AM to either move appointment time on 11/01/2020 or reschedule for 11/02/2020. Patient will have to call me back, another office called her.

## 2020-10-31 ENCOUNTER — Other Ambulatory Visit: Payer: Self-pay

## 2020-11-01 ENCOUNTER — Encounter: Payer: BC Managed Care – PPO | Admitting: Obstetrics and Gynecology

## 2020-11-01 ENCOUNTER — Other Ambulatory Visit: Payer: BC Managed Care – PPO

## 2020-11-02 ENCOUNTER — Other Ambulatory Visit (INDEPENDENT_AMBULATORY_CARE_PROVIDER_SITE_OTHER): Payer: BC Managed Care – PPO

## 2020-11-02 ENCOUNTER — Other Ambulatory Visit: Payer: Self-pay | Admitting: Physical Medicine and Rehabilitation

## 2020-11-02 ENCOUNTER — Ambulatory Visit (INDEPENDENT_AMBULATORY_CARE_PROVIDER_SITE_OTHER): Payer: BC Managed Care – PPO | Admitting: Student

## 2020-11-02 ENCOUNTER — Encounter: Payer: Self-pay | Admitting: Student

## 2020-11-02 ENCOUNTER — Other Ambulatory Visit: Payer: Self-pay

## 2020-11-02 VITALS — BP 117/75 | HR 87 | Temp 98.8°F | Ht 64.0 in | Wt 107.0 lb

## 2020-11-02 DIAGNOSIS — L659 Nonscarring hair loss, unspecified: Secondary | ICD-10-CM | POA: Insufficient documentation

## 2020-11-02 DIAGNOSIS — Z842 Family history of other diseases of the genitourinary system: Secondary | ICD-10-CM

## 2020-11-02 DIAGNOSIS — M797 Fibromyalgia: Secondary | ICD-10-CM | POA: Diagnosis not present

## 2020-11-02 DIAGNOSIS — E282 Polycystic ovarian syndrome: Secondary | ICD-10-CM | POA: Diagnosis not present

## 2020-11-02 MED ORDER — AMITRIPTYLINE HCL 50 MG PO TABS: 50.0000 mg | ORAL_TABLET | Freq: Every day | ORAL | 0 refills | Status: DC

## 2020-11-02 NOTE — Progress Notes (Signed)
Pt given lab req and sent to lab

## 2020-11-02 NOTE — Assessment & Plan Note (Signed)
Patient reports ongoing hair loss for which she is prescribed spironolactone 50mg  daily. 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 Patient endorses ongoing tenderness at site of biopsy. Examination of the biopsy site is unremarkable with no surrounding erythema, tenderness or swelling. -Reassurance provided -Continue following with dermatology

## 2020-11-02 NOTE — Progress Notes (Signed)
   CC: Bloating  HPI:  Ms.Talonda Haberland is a 25 y.o. with past medical history of IBS, nondiabetic gastroparesis, pelvic floor dysfunction, and fibromyalgia who presents to clinic for evaluation of bloating. Refer to problem list for charting of this encounter.  Past Medical History:  Diagnosis Date   Asthma    Bronchitis    Depression    Gastric paresis    Keratosis pilaris    Review of Systems:  Endorses headaches, neck pain, shoulder pain, arm pain, back pain, abdominal pain, pelvic pain, hip pain, leg pain, excessive fatigue, waking without feeling refreshed, cognitive impairment, abdominal pain, nausea, vomiting.   Physical Exam:  Vitals:   11/02/20 1429  BP: 117/75  Pulse: 87  Temp: 98.8 F (37.1 C)  TempSrc: Oral  SpO2: 100%  Weight: 107 lb (48.5 kg)  Height: 5\' 4"  (1.626 m)   Physical Exam Vitals reviewed.  Constitutional:      General: She is not in acute distress.    Appearance: She is not ill-appearing.  Skin:    Comments: Two stitches at left parietal scalp with no surrounding erythema, induration, tendernes  Neurological:     Mental Status: She is alert.  Psychiatric:        Mood and Affect: Mood normal.        Behavior: Behavior normal.        Thought Content: Thought content normal.        Judgment: Judgment normal.   Assessment & Plan:   See Encounters Tab for problem based charting.  Patient discussed with Dr. 

## 2020-11-02 NOTE — Patient Instructions (Addendum)
Brianna [name],  It was a pleasure meeting you in clinic today.  For your chronic pain, this is likely fibromyalgia. We recommend that you establish care with physical therapy to help with some of these symptoms. Please follow-up closely with our clinic for continued monitoring of your conditions.  Sincerely, Dr. Jasmine December, MD

## 2020-11-02 NOTE — Assessment & Plan Note (Signed)
Patient's primary concern today is whether she has chronic pain syndrome or fibromyalgia. She describes having diffuse pain of her body including her neck, bilateral shoulders, bilateral upper extremities, forearms, hips, legs. She describes severe fatigue, waking not refreshed, cognitive impairment and impact on her quality of her life. Widespread pain index and symptom severity score consistent with fibromyalgia. Patient interested in physical therapy referral. Initially patient interested in amitriptyline, however after discussion of risk of interaction with cyclobenzaprine, patient elected to defer starting additional medication at this time. Per her report, patient's rheumatologist is planning for patient to start pregabalin in the near future. -Referral to physical therapy -Encouraged aquatics exercise -Continue following closely with rheumatology  -Anticipate initiation of pregabalin

## 2020-11-03 DIAGNOSIS — F411 Generalized anxiety disorder: Secondary | ICD-10-CM | POA: Diagnosis not present

## 2020-11-03 NOTE — Progress Notes (Signed)
Internal Medicine Clinic Attending  Case discussed with Dr. Johnson  At the time of the visit.  We reviewed the resident's history and exam and pertinent patient test results.  I agree with the assessment, diagnosis, and plan of care documented in the resident's note.  

## 2020-11-04 ENCOUNTER — Ambulatory Visit: Payer: BC Managed Care – PPO | Admitting: Physical Therapy

## 2020-11-04 ENCOUNTER — Encounter: Payer: Self-pay | Admitting: Physical Therapy

## 2020-11-04 ENCOUNTER — Other Ambulatory Visit: Payer: Self-pay

## 2020-11-04 DIAGNOSIS — R278 Other lack of coordination: Secondary | ICD-10-CM | POA: Diagnosis not present

## 2020-11-04 DIAGNOSIS — M6281 Muscle weakness (generalized): Secondary | ICD-10-CM

## 2020-11-04 DIAGNOSIS — Z842 Family history of other diseases of the genitourinary system: Secondary | ICD-10-CM

## 2020-11-04 DIAGNOSIS — K3184 Gastroparesis: Secondary | ICD-10-CM

## 2020-11-04 DIAGNOSIS — R252 Cramp and spasm: Secondary | ICD-10-CM

## 2020-11-04 NOTE — Patient Instructions (Signed)
Access Code: [email or web address] URL: [email or web address] Date: 11/04/2020 Prepared by: Eulis Foster  Exercises Prone Hip Extension with Plantarflexion - 1 x daily - 3 x weekly - 1 sets - 10 reps Prone Hip Abduction on Slider - 1 x daily - 3 x weekly - 1 sets - 10 reps  [facility] [address], [address] [place], [place] [address] Phone # [phone] Fax [phone]

## 2020-11-04 NOTE — Therapy (Signed)
[facility] [address], [address] [place], [place], [address] Phone: [phone]   Fax:  [phone]  Physical Therapy Treatment  Patient Details  Name: Brianna [name] MRN: [id number] Date of Birth: 07-20-1995 Referring Provider (PT): Dr. Miguel Aschoff   Encounter Date: 11/04/2020   PT End of Session - 11/04/20 0940     Visit Number 5    Date for PT Re-Evaluation 02/07/21    Authorization Type BCBS    PT Start Time 0930    PT Stop Time 1010    PT Time Calculation (min) 40 min    Activity Tolerance Patient tolerated treatment well;Patient limited by pain    Behavior During Therapy [facility] for tasks assessed/performed             Past Medical History:  Diagnosis Date   Asthma    Bronchitis    Depression    Gastric paresis    Keratosis pilaris     Past Surgical History:  Procedure Laterality Date   WISDOM TOOTH EXTRACTION      There were no vitals filed for this visit.   Subjective Assessment - 11/04/20 0934     Subjective I have elevated DHEA. FASting glucose test is normal. I would like to get the fasting insulin test. I see the nutritionist on the Monday. I am on my cycle right now.    Patient Stated Goals reduce pain    Currently in Pain? Yes    Pain Score 4     Pain Location Pelvis    Pain Orientation Right;Left    Pain Descriptors / Indicators Aching;Spasm;Stabbing    Pain Type Chronic pain    Pain Radiating Towards Abdominal and pelvic floor and legs    Pain Onset More than a month ago    Pain Frequency Constant    Aggravating Factors  waking, sleeping on side, sitting for long driving    Pain Relieving Factors Hot packs    Multiple Pain Sites Yes                               [facility] Adult PT Treatment/Exercise - 11/04/20 0001       Lumbar Exercises: Stretches   Active Hamstring Stretch Right;Left;1 rep;30 seconds    Active Hamstring Stretch Limitations supine  with strap    Prone on Elbows Stretch 3 reps;10 seconds    ITB Stretch Right;Left;1 rep;30 seconds    ITB Stretch Limitations supine with strap    Piriformis Stretch Right;Left;1 rep;30 seconds    Piriformis Stretch Limitations supine    Other Lumbar Stretch Exercise hip adductors with strap supine, right, left, 30 seconds      Lumbar Exercises: Sidelying   Other Sidelying Lumbar Exercises iliacus pull back 15x each side with tactile cues for correct movement    Other Sidelying Lumbar Exercises sidely with diaphragmatic breathing into the lower rib cage to the stomach and into the pelvic floor with VC and tactile cues.      Lumbar Exercises: Prone   Other Prone Lumbar Exercises supine bringing feet out and in      Lumbar Exercises: Quadruped   Other Quadruped Lumbar Exercises back breathing but difficult on wrist      Knee/Hip Exercises: Prone   Hip Extension Strengthening;Right;Left;1 set;15 reps    Hip Extension Limitations engaging the gluteal first then the hamstring    Other Prone Exercises prone hip abduction 15  times each side                    PT Education - 11/04/20 1013     Education Details Access Code: [email or web address]    Person(s) Educated Patient    Methods Explanation;Demonstration;Verbal cues;Handout    Comprehension Returned demonstration;Verbalized understanding              PT Short Term Goals - 10/27/20 0846       PT SHORT TERM GOAL #1   Title patient is independent with initial HEP    Time 4    Period Weeks    Status Achieved      PT SHORT TERM GOAL #2   Title patient is able to use her dilator/wand correctly to reduce trigger points    Time 4    Period Weeks    Status On-going      PT SHORT TERM GOAL #3   Title patient is able to perform diaphragmatic breathing to bulge the pelvic floor    Time 4    Period Weeks    Status On-going      PT SHORT TERM GOAL #4   Title patient understands ways to calm the nervous system to reduce her  pain    Time 4    Period Weeks    Status Achieved      PT SHORT TERM GOAL #5   Title patient reports her pain intensity is reduced 25% of the day    Time 4    Period Weeks    Status On-going               PT Long Term Goals - 11/04/20 1007       PT LONG TERM GOAL #1   Title Patient is independent with advanced HEP    Time 4    Period Months    Status On-going      PT LONG TERM GOAL #2   Title pelvic pain decreased </= 3/10 50-60% of the time due to reduction of trigger points    Time 4    Period Months    Status On-going      PT LONG TERM GOAL #3   Title patient is able to fully empty her bowels due to improve pelvic floor coordination during a bowel movement    Baseline not able to fully empty, still feels bloated    Time 4    Period Months    Status On-going      PT LONG TERM GOAL #4   Title patient is able to fully empty her bladder and is able to wait 2-3 hours to urinate due to relaxation of the pelvic floor    Baseline empty the bladder, still has pressure    Time 4    Period Months    Status On-going                   Plan - 11/04/20 0940     Clinical Impression Statement Patient was able to exercise for 30 minutes with rest but no increase in pain. Today patient had some difficulty with diaphragmatic breathing to release the pelvic floor. Patient pain level started at 4/10 compared to 7/10. Patient was able to engage her gluteals prior to hamstring. She was able to do the iliacus pull backs correctly. She is unable to empty her bladder or rectum fully. Patient will benefit from skilled therapy to improve tissue mobility, reduce trigger points, and improve  function.    Personal Factors and Comorbidities Time since onset of injury/illness/exacerbation;Finances;Sex;Profession;Past/Current Experience    Examination-Activity Limitations Locomotion Level;Toileting;Stand    Examination-Participation Restrictions Occupation;Community  Activity;Interpersonal Relationship    Stability/Clinical Decision Making Stable/Uncomplicated    Rehab Potential Excellent    PT Frequency 1x / week    PT Duration Other (comment)   4 months   PT Treatment/Interventions ADLs/Self Care Home Management;Biofeedback;Cryotherapy;Electrical Stimulation;Iontophoresis 4mg /ml Dexamethasone;Moist Heat;Ultrasound;Neuromuscular re-education;Therapeutic exercise;Therapeutic activities;Patient/family education;Manual techniques;Dry needling;Taping;Spinal Manipulations;Joint Manipulations    PT Next Visit Plan if she brings the wand then go over it, continues with exercise, check alignment, and measure strength    PT Home Exercise Plan Access Code: [email or web address]    Consulted and Agree with Plan of Care Patient             Patient will benefit from skilled therapeutic intervention in order to improve the following deficits and impairments:  Decreased coordination, Increased fascial restricitons, Decreased endurance, Decreased activity tolerance, Dizziness, Pain, Decreased strength  Visit Diagnosis: Muscle weakness (generalized)  Cramp and spasm  Other lack of coordination     Problem List Patient Active Problem List   Diagnosis Date Noted   Fibromyalgia 11/02/2020   Hair loss 11/02/2020   Muscle spasticity 10/22/2020   Rash and other nonspecific skin eruption 10/22/2020   Paresthesia of bilateral legs 10/06/2020   Myalgia 10/06/2020   Chronic pelvic pain in female 10/06/2020   Chronic abdominal pain 10/06/2020   IBS (irritable bowel syndrome) 08/02/2020   Pelvic floor dysfunction 08/02/2020   Irritant dermatitis 08/02/2020   Gastroesophageal reflux disease 06/28/2020   Severe recurrent major depression without psychotic features (HCC) 06/28/2020   Polyarthralgia 06/28/2020   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 06/28/2020   Tachycardia 06/28/2020   History of COVID-19 06/28/2020   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 chronic  concentration deficit 06/28/2020   Recurrent major depressive disorder, in full remission (HCC) 06/28/2020   Atypical chest pain 06/22/2020   Dizziness 06/22/2020   SOB (shortness of breath) 06/22/2020   Palpitations 06/14/2020   Anxiety 05/04/2020   Internal and external hemorrhoids without complication 04/29/2020   Acute recurrent pansinusitis 10/21/2019   ETD (Eustachian tube dysfunction), bilateral 10/21/2019   Cat allergy due to both airborne and skin contact 05/01/2016   Chronic sore throat 05/01/2016   Acne vulgaris 03/11/2014   Mild intermittent asthma without complication 03/11/2014   Seasonal allergic rhinitis 03/11/2014   Seasonal allergies 03/11/2014    03/13/2014, PT 11/04/20 10:16 AM  [facility] [address], [address] [place], [place], [place] Phone: [phone]   Fax:  [phone]  Name: Brianna [name] MRN: [name] Date of Birth: 01/04/1996

## 2020-11-06 LAB — 2HR GTT W 1 HR, CARPENTER, 75 G
Glucose, 1 Hr, Gest: 81 mg/dL (ref 65–179)
Glucose, 2 Hr, Gest: 47 mg/dL — ABNORMAL LOW (ref 65–152)
Glucose, Fasting, Gest: 73 mg/dL (ref 65–91)

## 2020-11-06 LAB — DHEA-SULFATE: DHEA-SO4: 122 ug/dL (ref 14–349)

## 2020-11-06 LAB — 17-HYDROXYPROGESTERONE: 17-OH-Progesterone, LC/MS/MS: 19 ng/dL

## 2020-11-08 ENCOUNTER — Other Ambulatory Visit: Payer: Self-pay

## 2020-11-08 ENCOUNTER — Ambulatory Visit (INDEPENDENT_AMBULATORY_CARE_PROVIDER_SITE_OTHER): Payer: BC Managed Care – PPO | Admitting: Family Medicine

## 2020-11-08 DIAGNOSIS — K582 Mixed irritable bowel syndrome: Secondary | ICD-10-CM
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
## 2020-11-08 NOTE — Patient Instructions (Addendum)
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  It is likely that this is a contributing factor in your GI problems.    Goals:  Document severe GI symptoms along with what foods you had prior to onset of symptoms.  Include time of eating and symptom onset, food quantities as well as any other potentially influential factors like your mood and stress level.   Eat at least 6 times daily, with your first meal within the first hour of being up.   Look for sources of fat you can include to maximize energy intake: olive oil, canola oil, avocado, nut butters, mayonnaise, fatty fish.   During times of less stress, include small amounts of foods that might be poorly tolerated, and document your response.    Follow-up:  Let me know appt times I've provided that will work for you.

## 2020-11-09 ENCOUNTER — Other Ambulatory Visit: Payer: Self-pay

## 2020-11-09 MED ORDER — FLUTICASONE PROPIONATE 50 MCG/ACT NA SUSP: 1.0000 | Freq: Every day | NASAL | 1 refills | Status: DC

## 2020-11-09 MED ORDER — LORATADINE 10 MG PO TABS: 10.0000 mg | ORAL_TABLET | Freq: Every day | ORAL | 1 refills | Status: DC

## 2020-11-09 NOTE — Telephone Encounter (Signed)
Pt is requesting her loratadine (CLARITIN) 10 MG tablet and fluticasone (FLONASE) 50 MCG/ACT nasal spray sent to  [id number] - [place], [place] - [address] AT [facility] [address] Phone:  [phone]  Fax:  [phone]

## 2020-11-10 DIAGNOSIS — F411 Generalized anxiety disorder: Secondary | ICD-10-CM | POA: Diagnosis not present

## 2020-11-11 ENCOUNTER — Ambulatory Visit: Payer: BC Managed Care – PPO | Admitting: Physical Therapy

## 2020-11-11 ENCOUNTER — Encounter: Payer: Self-pay | Admitting: Physical Therapy

## 2020-11-11 ENCOUNTER — Other Ambulatory Visit: Payer: Self-pay

## 2020-11-11 ENCOUNTER — Ambulatory Visit: Payer: BC Managed Care – PPO | Admitting: Behavioral Health

## 2020-11-11 DIAGNOSIS — R252 Cramp and spasm: Secondary | ICD-10-CM | POA: Diagnosis not present

## 2020-11-11 DIAGNOSIS — F419 Anxiety disorder, unspecified: Secondary | ICD-10-CM

## 2020-11-11 DIAGNOSIS — R278 Other lack of coordination: Secondary | ICD-10-CM | POA: Diagnosis not present

## 2020-11-11 DIAGNOSIS — M6281 Muscle weakness (generalized): Secondary | ICD-10-CM | POA: Diagnosis not present

## 2020-11-11 DIAGNOSIS — F331 Major depressive disorder, recurrent, moderate: Secondary | ICD-10-CM

## 2020-11-11 NOTE — BH Specialist Note (Signed)
Integrated Behavioral Health via Telemedicine Visit  11/11/2020 Sonna Lipsky [id number]  Number of Integrated Behavioral Health visits: 6/6 Session Start time: 1:00pm  Session End time: 1:45pm Total time: 45   Referring Provider: Dr. Merrilyn Puma, MD Patient/Family location: Pt @ home in private [facility] Provider location: [facility] Office All persons participating in visit: Pt & Clinician Types of Service: Individual psychotherapy  I connected with [name] and/or Revonda Standard Downen's  self  via  Telephone or Engineer, civil (consulting)  (Video is Surveyor, mining) and verified that I am speaking with the correct person using two identifiers. Discussed confidentiality: Yes   I discussed the limitations of telemedicine and the availability of in person appointments.  Discussed there is a possibility of technology failure and discussed alternative modes of communication if that failure occurs.  I discussed that engaging in this telemedicine visit, they consent to the provision of behavioral healthcare and the services will be billed under their insurance.  Patient and/or legal guardian expressed understanding and consented to Telemedicine visit: Yes   Presenting Concerns: Patient and/or family reports the following symptoms/concerns: elevated Sx of anx due to health status changes Duration of problem: months; Severity of problem: moderate  Patient and/or Family's Strengths/Protective Factors: Social connections and Concrete supports in place (healthy food, safe environments, etc.)  Goals Addressed: Patient will:  Reduce symptoms of: anxiety, depression, mood instability, and stress   Increase knowledge and/or ability of: coping skills, healthy habits, self-management skills, and stress reduction   Demonstrate ability to: Increase healthy adjustment to current life circumstances  Progress towards Goals: Ongoing  Interventions: Interventions  utilized:  Solution-Focused Strategies, Supportive Counseling, and DBT Dialectal Behavioral Therapy Standardized Assessments completed:  ACE done over the phone today resulting in a 4/10 score to meet threshold for TIC  Patient and/or Family Response: Pt receptive to call & relating how her move is taking some time. Pt has dealt w/move-in issues she has successfully handled w/the Mgmt.   Assessment: Patient currently experiencing racing thought pattern she attributes to ADHD. Pt hopes to control her perfectionism & never feeling good enough. 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 Parents divorced when she was 9 or 10yo. Pt sts they were never emot'lly available to her & did not provide comfort or the lessons for emot'l regulation skills. She indicates her suppression of feelings in childhood now seems like they are overflowing.  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 Roylene Reason & her Rheumatologist @ [facility]. She is finishing her trial of Reglan & will move to Lyrica per Physician direction.  Pt id'd her triggers today as conflict w/anyone who thinks differently than herself on serious topics. It is also hard for her to deal w/ppl not getting what they deserve.  Patient may benefit from psychoedu & skills for emot'l recognition, the associated feelings & Px sensations so Pt can improve skills to regulate.  Plan: Follow up with behavioral health clinician on : 2-3 wks on telehealth for 60 min f/u Behavioral recommendations: Use Dr. Barnett Hatter work as a guide for [employer] & emot'l regulation skills. Acquire the book & workbook if possible. Secure the app, [employer]. Referral(s):  none @ this time; f/u w/Dr. Gerilyn Pilgrim on 11/29/20.  I discussed the assessment and treatment plan with the patient and/or parent/guardian. They were provided an opportunity to ask questions and all were answered. They agreed with the plan and demonstrated an  understanding of the instructions.  They were advised to call back or seek an in-person evaluation if the symptoms worsen or if the condition fails to improve as anticipated.  Donnetta Hutching, LMFT

## 2020-11-11 NOTE — Therapy (Signed)
[facility]al [facility] [address] [place], [place], [address] Phone: [phone]   Fax:  [phone]  Physical Therapy Treatment  Patient Details  Name: Brianna [name] MRN: [id number] Date of Birth: 10-Feb-1996 Referring Provider (PT): Dr. Miguel Aschoff   Encounter Date: 11/11/2020   PT End of Session - 11/11/20 0901     Visit Number 6    Date for PT Re-Evaluation 02/07/21    Authorization Type BCBS    PT Start Time 0845    PT Stop Time 0925    PT Time Calculation (min) 40 min    Activity Tolerance Patient tolerated treatment well;Patient limited by pain    Behavior During Therapy [facility] for tasks assessed/performed             Past Medical History:  Diagnosis Date   Asthma    Bronchitis    Depression    Gastric paresis    Keratosis pilaris     Past Surgical History:  Procedure Laterality Date   WISDOM TOOTH EXTRACTION      There were no vitals filed for this visit.   Subjective Assessment - 11/11/20 0849     Subjective I have not been able to stretch due to moving.    Patient Stated Goals reduce pain    Currently in Pain? Yes    Pain Score 5     Pain Location Pelvis    Pain Orientation Right;Left    Pain Descriptors / Indicators Aching;Spasm;Stabbing    Pain Type Chronic pain    Pain Radiating Towards abdominal and pelvic floor and legs    Pain Onset More than a month ago    Pain Frequency Constant    Aggravating Factors  walking, sleeping on side, sitting for long driving    Pain Relieving Factors Hot packs    Effect of Pain on Daily Activities hurts to walk, sitting for periods    Multiple Pain Sites Yes    Pain Score 9    Pain Location Hip    Pain Orientation Right    Pain Descriptors / Indicators Aching;Dull    Pain Type Chronic pain    Pain Onset More than a month ago    Pain Frequency Constant    Aggravating Factors  laying on right side, moving for too long or not moving for  too long    Pain Relieving Factors heat                [facility] PT Assessment - 11/11/20 0001       Palpation   SI assessment  ASIS are equal                        Pelvic Floor Special Questions - 11/11/20 0001     Pelvic Floor Internal Exam Patient confirms identification and approves PT to assess pelvic floor and treatment    Exam Type Vaginal    Palpation patient is placing the dilator into the vaginal canal, move it to the side and holding the bulbocavernosus on the other side to stretch the canal, tried moving the tip of the dilator to the pelvic floor muscles but no tenderness, as patient touches the bulbocavernosus she will feel pusating               [facility] Adult PT Treatment/Exercise - 11/11/20 0001       Neuro Re-ed    Neuro Re-ed Details  diaphragmatic breathing to elongate  the diaphragm and pelvic floor while she has the vaginal wand in and feeling the tension to be relieved      Lumbar Exercises: Stretches   Active Hamstring Stretch Right;Left;1 rep;30 seconds    Active Hamstring Stretch Limitations supine with strap    ITB Stretch Right;Left;1 rep;30 seconds    ITB Stretch Limitations supine with strap    Piriformis Stretch Right;Left;1 rep;30 seconds    Piriformis Stretch Limitations supine    Other Lumbar Stretch Exercise hip adductors with strap supine, right, left, 30 seconds                      PT Short Term Goals - 11/11/20 6195       PT SHORT TERM GOAL #1   Title patient is independent with initial HEP    Time 4    Period Weeks    Status Achieved      PT SHORT TERM GOAL #2   Title patient is able to use her dilator/wand correctly to reduce trigger points    Time 4    Period Weeks    Status Achieved      PT SHORT TERM GOAL #3   Title patient is able to perform diaphragmatic breathing to bulge the pelvic floor    Time 4    Period Weeks    Status On-going      PT SHORT TERM GOAL #4   Title patient  understands ways to calm the nervous system to reduce her pain    Time 4    Period Weeks    Target Date 11/04/20      PT SHORT TERM GOAL #5   Title patient reports her pain intensity is reduced 25% of the day    Time 4    Period Weeks    Status On-going               PT Long Term Goals - 11/04/20 1007       PT LONG TERM GOAL #1   Title Patient is independent with advanced HEP    Time 4    Period Months    Status On-going      PT LONG TERM GOAL #2   Title pelvic pain decreased </= 3/10 50-60% of the time due to reduction of trigger points    Time 4    Period Months    Status On-going      PT LONG TERM GOAL #3   Title patient is able to fully empty her bowels due to improve pelvic floor coordination during a bowel movement    Baseline not able to fully empty, still feels bloated    Time 4    Period Months    Status On-going      PT LONG TERM GOAL #4   Title patient is able to fully empty her bladder and is able to wait 2-3 hours to urinate due to relaxation of the pelvic floor    Baseline empty the bladder, still has pressure    Time 4    Period Months    Status On-going                   Plan - 11/11/20 0902     Clinical Impression Statement Patient understands how to use her wand correctly. Patient pelvis in correct alignment. Patient was not able to feel trigger points in the pelvic floor with the vaginal wand. She felt pulsating when her fingers  were on the bulbocavernosus and moveing the vaginal wand away from the tissue. She will feel tension in different areas of her body including the pelvic floor. Therapist has guided her to work on body scanning when she feels the tension to help the muscles let go. Patient will benefit from skilled therapy to improve tissue mobility, reduce trigger points, and improve function.    Personal Factors and Comorbidities Time since onset of injury/illness/exacerbation;Finances;Sex;Profession;Past/Current Experience     Examination-Activity Limitations Locomotion Level;Toileting;Stand    Examination-Participation Restrictions Occupation;Community Activity;Interpersonal Relationship    Rehab Potential Excellent    PT Frequency 1x / week    PT Duration Other (comment)   4 months   PT Treatment/Interventions ADLs/Self Care Home Management;Biofeedback;Cryotherapy;Electrical Stimulation;Iontophoresis 4mg /ml Dexamethasone;Moist Heat;Ultrasound;Neuromuscular re-education;Therapeutic exercise;Therapeutic activities;Patient/family education;Manual techniques;Dry needling;Taping;Spinal Manipulations;Joint Manipulations    PT Next Visit Plan continues with exercise for core strength,  check alignment, see how the body scanning is when she feels tension    PT Home Exercise Plan Access Code: [email or web address]    Consulted and Agree with Plan of Care Patient             Patient will benefit from skilled therapeutic intervention in order to improve the following deficits and impairments:  Decreased coordination, Increased fascial restricitons, Decreased endurance, Decreased activity tolerance, Dizziness, Pain, Decreased strength  Visit Diagnosis: Muscle weakness (generalized)  Cramp and spasm  Other lack of coordination     Problem List Patient Active Problem List   Diagnosis Date Noted   Fibromyalgia 11/02/2020   Hair loss 11/02/2020   Muscle spasticity 10/22/2020   Rash and other nonspecific skin eruption 10/22/2020   Paresthesia of bilateral legs 10/06/2020   Myalgia 10/06/2020   Chronic pelvic pain in female 10/06/2020   Chronic abdominal pain 10/06/2020   IBS (irritable bowel syndrome) 08/02/2020   Pelvic floor dysfunction 08/02/2020   Irritant dermatitis 08/02/2020   Gastroesophageal reflux disease 06/28/2020   Severe recurrent major depression without psychotic features (HCC) 06/28/2020   Polyarthralgia 06/28/2020   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 06/28/2020   Tachycardia  06/28/2020   History of COVID-19 06/28/2020   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 deficit 06/28/2020   Recurrent major depressive disorder, in full remission (HCC) 06/28/2020   Atypical chest pain 06/22/2020   Dizziness 06/22/2020   SOB (shortness of breath) 06/22/2020   Palpitations 06/14/2020   Anxiety 05/04/2020   Internal and external hemorrhoids without complication 04/29/2020   Acute recurrent pansinusitis 10/21/2019   ETD (Eustachian tube dysfunction), bilateral 10/21/2019   Cat allergy due to both airborne and skin contact 05/01/2016   Chronic sore throat 05/01/2016   Acne vulgaris 03/11/2014   Mild intermittent asthma without complication 03/11/2014   Seasonal allergic rhinitis 03/11/2014   Seasonal allergies 03/11/2014    03/13/2014, PT 11/11/20 9:29 AM  [facility] [address], [address] [place], [place], [place] Phone: [phone]   Fax:  [phone]  Name: Brianna [name] MRN: [name] Date of Birth: January 17, 1996

## 2020-11-15 ENCOUNTER — Other Ambulatory Visit: Payer: Self-pay

## 2020-11-15 ENCOUNTER — Encounter: Payer: Self-pay | Admitting: Internal Medicine

## 2020-11-15 ENCOUNTER — Ambulatory Visit (INDEPENDENT_AMBULATORY_CARE_PROVIDER_SITE_OTHER): Payer: BC Managed Care – PPO | Admitting: Internal Medicine

## 2020-11-15 VITALS — BP 108/69 | HR 90 | Ht 64.5 in | Wt 104.2 lb

## 2020-11-15 DIAGNOSIS — K582 Mixed irritable bowel syndrome: Secondary | ICD-10-CM | POA: Diagnosis not present

## 2020-11-15 DIAGNOSIS — M797 Fibromyalgia: Secondary | ICD-10-CM

## 2020-11-15 DIAGNOSIS — R21 Rash and other nonspecific skin eruption: Secondary | ICD-10-CM

## 2020-11-15 DIAGNOSIS — R768 Other specified abnormal immunological findings in serum: Secondary | ICD-10-CM | POA: Diagnosis not present

## 2020-11-15 DIAGNOSIS — M2550 Pain in unspecified joint: Secondary | ICD-10-CM

## 2020-11-15 DIAGNOSIS — R7689 Other specified abnormal immunological findings in serum: Secondary | ICD-10-CM | POA: Insufficient documentation

## 2020-11-15 DIAGNOSIS — R109 Unspecified abdominal pain: Secondary | ICD-10-CM

## 2020-11-15 DIAGNOSIS — G8929 Other chronic pain: Secondary | ICD-10-CM

## 2020-11-15 NOTE — Progress Notes (Signed)
Office Visit Note  Patient: Brianna [name]             Date of Birth: 04-29-1996           MRN: [id number]             PCP: Rudene Christians, DO Referring: Merrilyn Puma, MD Visit Date: 11/15/2020   Subjective:  Follow-up (Patient is here to discuss [facility] lab results. Patient denies changes symptoms. Patient has a question about taking Benadryl daily while on Lyrica. )   History of Present Illness: Brianna [name] is a 25 y.o. female here for follow up for complicated ongoing symptoms after sending for [facility] CTD lab testing after her initial visit.  Antibody panel result was mostly unrevealing redemonstrated positive ANA titer was also positive for antiphosphatidylserine antibodies which can be related to antiphospholipid antibody syndrome.  Her symptoms have not changed significantly since the last visit still having generalized features particularly problems with her digestive symptoms and poor oral tolerance.  She was started on the Lyrica with titration to 50 mg daily dosing but only for a few weeks.  Previous HPI 10/22/20 [name] is a 25 y.o. 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 Some symptoms have been chronic and she reports a history of joint pains and GI issues since she was in elementary school. She has some history of hypermobility without major injuries, and endoscopy has revealed hemorrhoids without any other pathology. However now many problems are worse including severe weight loss and GI problems starting around September of last year with fairly precipitous worsening and have continued since then. She does recall personal life stressors were increased at that time, but her symptoms have remained persistent. She has tried multiple  treatments so far including several SSRIs, wellbutrin, duloxetine not tolerable on account of severe night sweat side effects. She was recent recommended trial of amitriptyline. 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 Chronic GI problems with mixed constipation and diarrhea she has undergone recent motility study with follow up and plan with her gastroenterologist pending.  Review of Systems  Constitutional:  Positive for fatigue.  HENT:  Positive for mouth sores. Negative for mouth dryness and nose dryness.   Eyes:  Positive for pain and visual disturbance. Negative for itching and dryness.  Respiratory:  Positive for shortness of breath. Negative for cough, hemoptysis and difficulty breathing.   Cardiovascular:  Positive for chest pain and palpitations. Negative for swelling in legs/feet.  Gastrointestinal:  Positive for abdominal pain, constipation and diarrhea. Negative for blood in stool.  Endocrine: Positive for increased urination.  Genitourinary:  Positive for painful urination.  Musculoskeletal:  Positive for joint pain, joint pain, joint swelling, myalgias, muscle weakness, morning stiffness, muscle tenderness and myalgias.  Skin:  Positive for color change, rash and redness.  Allergic/Immunologic: Negative for susceptible to infections.  Neurological:  Positive for dizziness, headaches, memory loss and weakness. Negative for numbness.  Hematological:  Positive for swollen glands.  Psychiatric/Behavioral:  Positive for confusion and sleep disturbance.    PMFS History:  Patient Active Problem List   Diagnosis Date Noted   Positive ANA (antinuclear  antibody) 11/15/2020   Fibromyalgia 11/02/2020   Hair loss 11/02/2020    Muscle spasticity 10/22/2020   Rash and other nonspecific skin eruption 10/22/2020   Paresthesia of bilateral legs 10/06/2020   Myalgia 10/06/2020   Chronic pelvic pain in female 10/06/2020   Chronic abdominal pain 10/06/2020   IBS (irritable bowel syndrome) 08/02/2020   Pelvic floor dysfunction 08/02/2020   Irritant dermatitis 08/02/2020   Gastroesophageal reflux disease 06/28/2020   Severe recurrent major depression without psychotic features (HCC) 06/28/2020   Polyarthralgia 06/28/2020   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 06/28/2020   Tachycardia 06/28/2020   History of COVID-19 06/28/2020   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 deficit 06/28/2020   Recurrent major depressive disorder, in full remission (HCC) 06/28/2020   Atypical chest pain 06/22/2020   Dizziness 06/22/2020   SOB (shortness of breath) 06/22/2020   Palpitations 06/14/2020   Anxiety 05/04/2020   Internal and external hemorrhoids without complication 04/29/2020   Acute recurrent pansinusitis 10/21/2019   ETD (Eustachian tube dysfunction), bilateral 10/21/2019   Cat allergy due to both airborne and skin contact 05/01/2016   Chronic sore throat 05/01/2016   Acne vulgaris 03/11/2014   Mild intermittent asthma without complication 03/11/2014   Seasonal allergic rhinitis 03/11/2014   Seasonal allergies 03/11/2014    Past Medical History:  Diagnosis Date   Asthma    Bronchitis    Depression    Gastric paresis    Keratosis pilaris     Family History  Problem Relation Age of Onset   Diabetes Mother    Polycystic ovary syndrome Mother    GER disease Father    Heart disease Paternal Uncle    Past Surgical History:  Procedure Laterality Date   WISDOM TOOTH EXTRACTION     Social History   Social History Narrative   Not on file   Immunization History  Administered Date(s) Administered   HPV Quadrivalent 09/04/2012   Influenza Split 02/20/2020   Influenza,inj,Quad  PF,6+ Mos 05/02/2015, 03/17/2019, 03/07/2020   PFIZER(Purple Top)SARS-COV-2 Vaccination 07/31/2019, 08/25/2019, 02/20/2020, 03/07/2020   Tdap 12/10/2014, 03/17/2019     Objective: Vital Signs: BP 108/69 (BP Location: Left Arm, Patient Position: Sitting, Cuff Size: Normal)   Pulse 90   Ht 5' 4.5" (1.638 m)   Wt 104 lb 3.2 oz (47.3 kg)   LMP 11/01/2020   BMI 17.61 kg/m    Physical Exam Constitutional:      Comments: Underweight  Eyes:     Conjunctiva/sclera: Conjunctivae normal.  Skin:    General: Skin is warm and dry.     Findings: No rash.  Neurological:     General: No focal deficit present.     Mental Status: She is alert.  Psychiatric:        Mood and Affect: Mood normal.     Musculoskeletal Exam:  Elbows full ROM no tenderness or swelling Wrists full ROM no tenderness or swelling Fingers full ROM no tenderness or swelling Knees full ROM no tenderness or swelling   Investigation: No additional findings.  Imaging: No results found.  Recent Labs: Lab Results  Component Value Date   WBC 4.8 09/30/2020   HGB 13.2 09/30/2020   PLT 234 09/30/2020   NA 140 11/15/2020   K 4.5 11/15/2020   CL 102 11/15/2020   CO2 28 11/15/2020   GLUCOSE 80 11/15/2020   BUN 17 11/15/2020   CREATININE 0.65 11/15/2020   BILITOT 0.5 11/15/2020   ALKPHOS 53 10/31/2016   AST 18 11/15/2020  ALT 18 11/15/2020   PROT 7.7 11/15/2020   ALBUMIN 4.2 10/31/2016   CALCIUM 10.2 11/15/2020   GFRAA 145 11/15/2020    Speciality Comments: No specialty comments available.  Procedures:  No procedures performed Allergies: Clindamycin   Assessment / Plan:     Visit Diagnoses: Positive ANA (antinuclear antibody)  Polyarthralgia  Rash and other nonspecific skin eruption - Plan: Mitochondrial antibodies, Anti-smooth muscle antibody, IgG, Lupus Anticoagulant Eval w/Reflex, COMPLETE METABOLIC PANEL WITH GFR, Sedimentation rate  Positive ANA diffuse joint pains and other systemic symptoms but  no specific positive autoimmune disease serology and no very specific clinical features seen again on exam.  We will follow-up positive antiphospholipid L-Serine antibodies checking lupus anticoagulant test today.  Also checking sedimentation rate.  She has concern about autoimmune hepatitis as a possible cause of joint pains and gastrointestinal symptoms with no specific testing for this.  Symptoms are not typical for presentation but will check antimitochondrial and smooth muscle antibodies and repeat complete metabolic panel today.  Chronic abdominal pain Irritable bowel syndrome with both constipation and diarrhea  Chronic abdominal pain irritable bowel type symptoms and recent gastric emptying study apparently indicative of gastroparesis.  She has upcoming appointment with gastroenterology to review the findings and current symptoms plan.  Checking autoimmune hepatitis serology as above.  Fibromyalgia  She is experiencing issues with widespread pain plus associated global symptoms with fatigue, IBS, headaches, concentration and memory difficulty suggestive for fibromyalgia syndrome.  She recently started the Lyrica medicine for this which may be beneficial and is also still undergoing evaluation for any underlying processes.   Orders: Orders Placed This Encounter  Procedures   Mitochondrial antibodies   Anti-smooth muscle antibody, IgG   Lupus Anticoagulant Eval w/Reflex   COMPLETE METABOLIC PANEL WITH GFR   Sedimentation rate    No orders of the defined types were placed in this encounter.    Follow-Up Instructions: No follow-ups on file.   Fuller Plan, MD  Note - This record has been created using [employer].  Chart creation errors have been sought, but may not always  have been located. Such creation errors do not reflect on  the standard of medical care.

## 2020-11-17 DIAGNOSIS — F411 Generalized anxiety disorder: Secondary | ICD-10-CM | POA: Diagnosis not present

## 2020-11-18 ENCOUNTER — Encounter: Payer: Self-pay | Admitting: Physical Therapy

## 2020-11-18 ENCOUNTER — Ambulatory Visit: Payer: BC Managed Care – PPO | Admitting: Physical Therapy

## 2020-11-18 ENCOUNTER — Other Ambulatory Visit: Payer: Self-pay

## 2020-11-18 DIAGNOSIS — R252 Cramp and spasm: Secondary | ICD-10-CM

## 2020-11-18 DIAGNOSIS — R278 Other lack of coordination: Secondary | ICD-10-CM | POA: Diagnosis not present

## 2020-11-18 DIAGNOSIS — M6281 Muscle weakness (generalized): Secondary | ICD-10-CM | POA: Diagnosis not present

## 2020-11-18 LAB — SEDIMENTATION RATE: Sed Rate: 2 mm/h (ref 0–20)

## 2020-11-18 LAB — LUPUS ANTICOAGULANT EVAL W/ REFLEX
PTT-LA Screen: 39 s (ref <=40)
dRVVT: 30 s (ref <=45)

## 2020-11-18 LAB — COMPLETE METABOLIC PANEL WITH GFR
AG Ratio: 2.1 (calc) (ref 1.0–2.5)
ALT: 18 U/L (ref 6–29)
AST: 18 U/L (ref 10–30)
Albumin: 5.2 g/dL — ABNORMAL HIGH (ref 3.6–5.1)
Alkaline phosphatase (APISO): 50 U/L (ref 31–125)
BUN: 17 mg/dL (ref 7–25)
CO2: 28 mmol/L (ref 20–32)
Calcium: 10.2 mg/dL (ref 8.6–10.2)
Chloride: 102 mmol/L (ref 98–110)
Creat: 0.65 mg/dL (ref 0.50–1.10)
GFR, Est African American: 145 mL/min/{1.73_m2} (ref >=60)
GFR, Est Non African American: 125 mL/min/{1.73_m2} (ref >=60)
Globulin: 2.5 g/dL (calc) (ref 1.9–3.7)
Glucose, Bld: 80 mg/dL (ref 65–99)
Potassium: 4.5 mmol/L (ref 3.5–5.3)
Sodium: 140 mmol/L (ref 135–146)
Total Bilirubin: 0.5 mg/dL (ref 0.2–1.2)
Total Protein: 7.7 g/dL (ref 6.1–8.1)

## 2020-11-18 LAB — ANTI-SMOOTH MUSCLE ANTIBODY, IGG: Actin (Smooth Muscle) Antibody (IGG): <20 U (ref <20)

## 2020-11-18 LAB — MITOCHONDRIAL ANTIBODIES: Mitochondrial M2 Ab, IgG: <=20 U

## 2020-11-18 NOTE — Therapy (Signed)
[facility] [address] [place], [place], [address] Phone: [phone]   Fax:  [phone]  Physical Therapy Treatment  Patient Details  Name: Brianna [name] MRN: [id number] Date of Birth: 11/14/1995 Referring Provider (PT): Dr. Miguel Aschoff   Encounter Date: 11/18/2020   PT End of Session - 11/18/20 1009     Visit Number 7    Date for PT Re-Evaluation 02/07/21    Authorization Type BCBS    PT Start Time 0930    PT Stop Time 1008    PT Time Calculation (min) 38 min    Activity Tolerance Patient tolerated treatment well;Patient limited by pain    Behavior During Therapy [facility] for tasks assessed/performed             Past Medical History:  Diagnosis Date   Asthma    Bronchitis    Depression    Gastric paresis    Keratosis pilaris     Past Surgical History:  Procedure Laterality Date   WISDOM TOOTH EXTRACTION      There were no vitals filed for this visit.   Subjective Assessment - 11/18/20 0933     Subjective Going outside more.    Patient Stated Goals reduce pain    Currently in Pain? Yes    Pain Score 3     Pain Location Pelvis    Pain Orientation Right;Left    Pain Descriptors / Indicators Aching;Spasm;Stabbing    Pain Type Chronic pain    Pain Radiating Towards abdominal and pelvic floor and legs    Pain Onset More than a month ago    Pain Frequency Constant    Aggravating Factors  not moving, moving in a certian direction, pressure on the bones    Pain Relieving Factors hot packs    Effect of Pain on Daily Activities hurts to walk and sitting for long periods    Pain Score 4    Pain Location Hip    Pain Orientation Right    Pain Descriptors / Indicators Aching;Dull    Pain Type Chronic pain    Pain Onset More than a month ago    Pain Frequency Constant    Aggravating Factors  laying on right side, moving for too long or not moving for too long    Pain Relieving Factors  heat                [facility] PT Assessment - 11/18/20 0001       Palpation   SI assessment  ASIS are equal                           [facility] Adult PT Treatment/Exercise - 11/18/20 0001       Lumbar Exercises: Stretches   Other Lumbar Stretch Exercise happy baby with diaphgramatic breathing; supine hip IR/ER    Other Lumbar Stretch Exercise downward dog with pedlaing her feet      Lumbar Exercises: Quadruped   Madcat/Old Horse 10 reps    Madcat/Old Horse Limitations good mobility o fthe lumbar spine    Other Quadruped Lumbar Exercises quadruped with tread the needle then lift arm up to the cieling      Manual Therapy   Manual Therapy Myofascial release    Myofascial Release fascial release to the urogenital diaphgram and respiratory diaphramg with improved mobility and good digeative sounds  PT Short Term Goals - 11/18/20 7829       PT SHORT TERM GOAL #1   Title patient is independent with initial HEP    Period Weeks    Status Achieved      PT SHORT TERM GOAL #2   Title patient is able to use her dilator/wand correctly to reduce trigger points    Time 4    Period Weeks    Status Achieved      PT SHORT TERM GOAL #3   Title patient is able to perform diaphragmatic breathing to bulge the pelvic floor    Time 4    Period Weeks    Status Achieved      PT SHORT TERM GOAL #4   Title patient understands ways to calm the nervous system to reduce her pain    Time 4    Period Weeks    Status Achieved      PT SHORT TERM GOAL #5   Title patient reports her pain intensity is reduced 25% of the day    Time 4    Period Weeks    Status Achieved               PT Long Term Goals - 11/04/20 1007       PT LONG TERM GOAL #1   Title Patient is independent with advanced HEP    Time 4    Period Months    Status On-going      PT LONG TERM GOAL #2   Title pelvic pain decreased </= 3/10 50-60% of the time due to  reduction of trigger points    Time 4    Period Months    Status On-going      PT LONG TERM GOAL #3   Title patient is able to fully empty her bowels due to improve pelvic floor coordination during a bowel movement    Baseline not able to fully empty, still feels bloated    Time 4    Period Months    Status On-going      PT LONG TERM GOAL #4   Title patient is able to fully empty her bladder and is able to wait 2-3 hours to urinate due to relaxation of the pelvic floor    Baseline empty the bladder, still has pressure    Time 4    Period Months    Status On-going                   Plan - 11/18/20 1011     Clinical Impression Statement Patient is doing better with her diaphgramatci breathing and expansion of the lower rib cage. Patient reports her pain is overall decreased by 25%. She is working on her Gastroparesis and still having difficulty with the digestion. Patient pelvic pain today is 3/10 compared from 5/10. Patient is very thin making it uncomfortable in sitting and laying down due to increased bones proturding. Patient is having trouble with bowel movements but could be due to the reduction of food intake and her gastroparesis. Patient is doint the body scanning. Patient will benefit from skilled therapy to improve tissue mobiltiy, reduce trigger points, and improve function.    Personal Factors and Comorbidities Time since onset of injury/illness/exacerbation;Finances;Sex;Profession;Past/Current Experience    Examination-Activity Limitations Locomotion Level;Toileting;Stand    Examination-Participation Restrictions Occupation;Community Activity;Interpersonal Relationship    Stability/Clinical Decision Making Stable/Uncomplicated    Rehab Potential Excellent    PT Frequency 1x / week  PT Duration Other (comment)    PT Treatment/Interventions ADLs/Self Care Home Management;Biofeedback;Cryotherapy;Electrical Stimulation;Iontophoresis 4mg /ml Dexamethasone;Moist  Heat;Ultrasound;Neuromuscular re-education;Therapeutic exercise;Therapeutic activities;Patient/family education;Manual techniques;Dry needling;Taping;Spinal Manipulations;Joint Manipulations    PT Next Visit Plan continue with fascial release; check internally for tissue trigger points, release of the coccyx, work on lower rib mobility    PT Home Exercise Plan Access Code: [email or web address]    Consulted and Agree with Plan of Care Patient             Patient will benefit from skilled therapeutic intervention in order to improve the following deficits and impairments:  Decreased coordination, Increased fascial restricitons, Decreased endurance, Decreased activity tolerance, Dizziness, Pain, Decreased strength  Visit Diagnosis: Muscle weakness (generalized)  Cramp and spasm  Other lack of coordination     Problem List Patient Active Problem List   Diagnosis Date Noted   Positive ANA (antinuclear antibody) 11/15/2020   Fibromyalgia 11/02/2020   Hair loss 11/02/2020   Muscle spasticity 10/22/2020   Rash and other nonspecific skin eruption 10/22/2020   Paresthesia of bilateral legs 10/06/2020   Myalgia 10/06/2020   Chronic pelvic pain in female 10/06/2020   Chronic abdominal pain 10/06/2020   IBS (irritable bowel syndrome) 08/02/2020   Pelvic floor dysfunction 08/02/2020   Irritant dermatitis 08/02/2020   Gastroesophageal reflux disease 06/28/2020   Severe recurrent major depression without psychotic features (HCC) 06/28/2020   Polyarthralgia 06/28/2020   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 06/28/2020   Tachycardia 06/28/2020   History of COVID-19 06/28/2020   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 deficit 06/28/2020   Recurrent major depressive disorder, in full remission (HCC) 06/28/2020   Atypical chest pain 06/22/2020   Dizziness 06/22/2020   SOB (shortness of breath) 06/22/2020   Palpitations 06/14/2020   Anxiety 05/04/2020   Internal and  external hemorrhoids without complication 04/29/2020   Acute recurrent pansinusitis 10/21/2019   ETD (Eustachian tube dysfunction), bilateral 10/21/2019   Cat allergy due to both airborne and skin contact 05/01/2016   Chronic sore throat 05/01/2016   Acne vulgaris 03/11/2014   Mild intermittent asthma without complication 03/11/2014   Seasonal allergic rhinitis 03/11/2014   Seasonal allergies 03/11/2014    03/13/2014, PT 11/18/20 10:17 AM  [facility] [address] [place], [place], [place] Phone: [phone]   Fax:  [phone]  Name: Brianna [name] MRN: [name] Date of Birth: 23-Sep-1995

## 2020-11-23 ENCOUNTER — Encounter: Payer: Self-pay | Admitting: *Deleted

## 2020-11-23 DIAGNOSIS — K3184 Gastroparesis: Secondary | ICD-10-CM | POA: Diagnosis not present

## 2020-11-23 DIAGNOSIS — K5901 Slow transit constipation: Secondary | ICD-10-CM | POA: Diagnosis not present

## 2020-11-23 DIAGNOSIS — Z8616 Personal history of COVID-19: Secondary | ICD-10-CM | POA: Diagnosis not present

## 2020-11-25 ENCOUNTER — Encounter: Payer: BC Managed Care – PPO | Admitting: Physical Therapy

## 2020-11-25 DIAGNOSIS — F411 Generalized anxiety disorder: Secondary | ICD-10-CM | POA: Diagnosis not present

## 2020-11-29 ENCOUNTER — Other Ambulatory Visit: Payer: Self-pay | Admitting: *Deleted

## 2020-11-29 ENCOUNTER — Other Ambulatory Visit: Payer: Self-pay | Admitting: Family Medicine

## 2020-11-29 ENCOUNTER — Encounter: Payer: BC Managed Care – PPO | Admitting: Physical Therapy

## 2020-11-29 ENCOUNTER — Ambulatory Visit (INDEPENDENT_AMBULATORY_CARE_PROVIDER_SITE_OTHER): Payer: BC Managed Care – PPO | Admitting: Family Medicine

## 2020-11-29 ENCOUNTER — Other Ambulatory Visit: Payer: Self-pay

## 2020-11-29 DIAGNOSIS — K582 Mixed irritable bowel syndrome: Secondary | ICD-10-CM

## 2020-11-29 DIAGNOSIS — Z842 Family history of other diseases of the genitourinary system: Secondary | ICD-10-CM

## 2020-11-29 DIAGNOSIS — Z713 Dietary counseling and surveillance: Secondary | ICD-10-CM | POA: Diagnosis not present
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
## 2020-11-29 NOTE — Patient Instructions (Addendum)
Goals: Try to incorporate more liquid calories, which may be easier to digest, e.g., smoothies, almond milk, or fruit juices.         You may want to look into protein products such as Molli Posey.   Eat at least 6 times daily, with your first meal within the first hour of being up.   Increase sources of high-quality fat to maximize energy intake: olive oil, canola oil, avocado, nut butters, mayonnaise (e.g., chicken or tuna salad), fatty fish.   During times of less stress, include small amounts of foods that might be poorly tolerated, and document your response.  Talk to Dr. Monna Fam about strategies or tools for managing anxiety preemptively - with some type of daily routine.         - Experiment with yoga nidra at bedtime to see if it helps you sleep.    Follow-up video visit on Tuesday, August 9 at 11 AM.  Note:  If you have abdominal discomfort after eating a new food or larger amount of food than usual, this is not necessarily bad.  After prolonged food restriction, the GI system needs to adapt to normal eating again.   - Think of your GI tract as a muscular system that gets "de-trained" with food restriction.  - During an energy deficit, the body reduces production of anything not acutely needed for survival, including enzymes, neurotransmitters, and hormones involved in digestion.  As you increase variety and volume of food, your body needs to start ramping up production of these substances, which means digestion functions at lower capacity until the body adapts.   - Lastly, when you starve yourself through restrictive eating, you are also starving your gut microbes.  Just as it takes some time to increase production of digestive substances, it takes time for gut microbes to colonize and multiply, ultimately fulfilling a very important role in healthy digestion.   After a period of food restriction, it is usually necessary to "eat beyond discomfort" as the body re-adapts to normal food intake.

## 2020-11-29 NOTE — Progress Notes (Signed)
NEUROLOGY CONSULTATION NOTE  Brianna [name] MRN: [id number] DOB: 03/24/1996  Referring provider: Leroy Libman, MD Primary care provider: Rudene Christians, DO  Reason for consult:  headaches  Assessment/Plan:   Migraine with and without aura Chronic tension-type headache Chronic pain syndrome  Start nortriptyline 10mg  at bedtime.  May help with trouble sleeping and chronic pain as well as headache.  We can increase to 25mg  at bedtime in 4 weeks if needed For migraine rescue:  Maxalt-MLT 10mg .  If ineffective, consider a nasal spray such as Tosymra or Trudhesa.  Would avoid a non-disintegrating tablet due to gastroparesis. Zofran-ODT 4mg  for nausea Limit use of pain relievers to no more than 2 days out of week to prevent risk of rebound or medication-overuse headache. Keep headache diary Follow up 6 months.   Subjective:  Brianna [name] is a 25 year old right-handed female with asthma, gastroparesis and depression who presents for headaches.  History supplemented by referring provider's note.  For her entire life, she has had near daily fronto/bi-temporal aching/throbbing headache off and on throughout the day.  Does not treat with medication.  Started having migraines a couple of months ago Onset:  May 2022 Location:  typically right sided, sometimes left sided Quality:  throbbing Intensity:  severe.   Aura:  sometimes sees stars/blurred vision without headache lasting 1 to 2 hours Prodrome:  none Associated symptoms:  Nausea, photophobia, phonophobia, blurred vision.  She denies associated unilateral numbness or weakness. Duration:  a couple of  hours Frequency:  about 20 days of migraine in past 2 months (last one was 3 weeks ago)no Triggers:  weather, hormone Relieving factors:  nothing Activity:  aggravates  Current NSAIDS/analgesics:  ibuprofen once a week Current triptans:  none Current ergotamine:  none Current anti-emetic:  Zofran 4mg  Current muscle  relaxants:  none Current Antihypertensive medications:  none Current Antidepressant medications:  none Current Anticonvulsant medications:  Lyrica 25mg  BID Current anti-CGRP:  none Current Vitamins/Herbal/Supplements:  ferrous sulfate, D Current Antihistamines/Decongestants:  loraditine, Flonase Other therapy:  none Hormone/birth control:  none Other medications:  BuSpar  Past NSAIDS/analgesics:  no Past abortive triptans:  no Past abortive ergotamine:  none Past muscle relaxants:  Flexeril Past anti-emetic:  Reglan, Zofran Past antihypertensive medications:  none Past antidepressant medications:  Cymbalta Past anticonvulsant medications:  none Past anti-CGRP:  none Past vitamins/Herbal/Supplements:  magnesium Past antihistamines/decongestants:  none Other past therapies:  none  Multiple problems since having COVID-19 in August 2020.  She has longstanding history of chronic pain which became worse after COVID.  Diagnosed with fibromyalgia.  She also developed gastroparesis.  Denies orthostatic hypotension.  Caffeine:  No coffee or soda Diet:  64 oz water daily.  Eating is limited due to gastroparesis.   Exercise:  limited due to chronic pain Depression:  yes; Anxiety:  yes Other pain:  chronic pain all of her life - carries diagnosis of fibromyalgia.  Worse since having COVID in August 2020.   Sleep hygiene:  poor.  Wakes up 3 to 4 times in the night.  Trouble falling back to sleep.   Family history of headache:  yes   PAST MEDICAL HISTORY: Past Medical History:  Diagnosis Date   Asthma    Bronchitis    Depression    Gastric paresis    Keratosis pilaris     PAST SURGICAL HISTORY: Past Surgical History:  Procedure Laterality Date   WISDOM TOOTH EXTRACTION      MEDICATIONS: Current Outpatient Medications on File Prior to  Visit  Medication Sig Dispense Refill   albuterol (VENTOLIN HFA) 108 (90 Base) MCG/ACT inhaler Inhale into the lungs. (Patient not taking:  Reported on 11/29/2020)     ASHWAGANDHA PO Take by mouth.     busPIRone (BUSPAR) 5 MG tablet TAKE 1 TABLET BY MOUTH 3 TIMES DAILY AS NEEDED. 270 tablet 2   cholecalciferol (VITAMIN D) 25 MCG (1000 UNIT) tablet Take 500 Units by mouth.     cyclobenzaprine (FLEXERIL) 10 MG tablet as needed. (Patient not taking: Reported on 11/29/2020)     Ferrous Sulfate Dried (EQ SLOW-RELEASE IRON) 45 MG TBCR Take 1 tablet by mouth every other day. Every 3 days     fluticasone (FLONASE) 50 MCG/ACT nasal spray Place 1 spray into both nostrils daily. 11.1 mL 1   loratadine (CLARITIN) 10 MG tablet Take 1 tablet (10 mg total) by mouth daily. (Patient taking differently: Take 20 mg by mouth daily.) 90 tablet 1   pregabalin (LYRICA) 25 MG capsule Take 25 mg by mouth in the morning and at bedtime. Patient will start taking twice daily in 1 week.     Prenatal Vit-Fe Fumarate-FA (PRENATAL PO) Take by mouth daily.     Prucalopride Succinate 2 MG TABS Take by mouth.     UNABLE TO FIND daily. Med Name: viviscal (OTC)     No current facility-administered medications on file prior to visit.    ALLERGIES: Allergies  Allergen Reactions   Clindamycin Hives, Rash and Swelling    Other reaction(s): swelling    FAMILY HISTORY: Family History  Problem Relation Age of Onset   Diabetes Mother    Polycystic ovary syndrome Mother    GER disease Father    Heart disease Paternal Uncle     Objective:  Blood pressure 103/67, pulse 75, height 5\' 4"  (1.626 m), weight 106 lb 3.2 oz (48.2 kg), last menstrual period 11/01/2020, SpO2 92 %. General: No acute distress.  Patient appears well-groomed.   Head:  Normocephalic/atraumatic Eyes:  fundi examined but not visualized Neck: supple, no paraspinal tenderness, full range of motion Back: No paraspinal tenderness Heart: regular rate and rhythm Lungs: Clear to auscultation bilaterally. Vascular: No carotid bruits. Neurological Exam: Mental status: alert and oriented to person,  place, and time, recent and remote memory intact, fund of knowledge intact, attention and concentration intact, speech fluent and not dysarthric, language intact. Cranial nerves: CN I: not tested CN II: pupils equal, round and reactive to light, visual fields intact CN III, IV, VI:  full range of motion, no nystagmus, no ptosis CN V: facial sensation intact. CN VII: upper and lower face symmetric CN VIII: hearing intact CN IX, X: gag intact, uvula midline CN XI: sternocleidomastoid and trapezius muscles intact CN XII: tongue midline Bulk & Tone: normal, no fasciculations. Motor:  muscle strength 5/5 throughout Sensation:  Pinprick, temperature and vibratory sensation intact. Deep Tendon Reflexes:  2+ throughout,  toes downgoing.   Finger to nose testing:  Without dysmetria.   Heel to shin:  Without dysmetria.   Gait:  Normal station and stride.  Romberg negative.    Thank you for allowing me to take part in the care of this patient.  11/03/2020, DO  CC:  Katie Masters, DO   Shon Millet, MD

## 2020-11-30 ENCOUNTER — Encounter: Payer: Self-pay | Admitting: Neurology

## 2020-11-30 ENCOUNTER — Ambulatory Visit (INDEPENDENT_AMBULATORY_CARE_PROVIDER_SITE_OTHER): Payer: BC Managed Care – PPO | Admitting: Neurology

## 2020-11-30 ENCOUNTER — Encounter: Payer: Self-pay | Admitting: Internal Medicine

## 2020-11-30 ENCOUNTER — Other Ambulatory Visit: Payer: Self-pay

## 2020-11-30 VITALS — BP 103/67 | HR 75 | Ht 64.0 in | Wt 106.2 lb

## 2020-11-30 DIAGNOSIS — G43109 Migraine with aura, not intractable, without status migrainosus: Secondary | ICD-10-CM | POA: Diagnosis not present

## 2020-11-30 DIAGNOSIS — R519 Headache, unspecified: Secondary | ICD-10-CM

## 2020-11-30 DIAGNOSIS — G44229 Chronic tension-type headache, not intractable: Secondary | ICD-10-CM

## 2020-11-30 DIAGNOSIS — K3184 Gastroparesis: Secondary | ICD-10-CM | POA: Diagnosis not present

## 2020-11-30 LAB — INSULIN, RANDOM: Insulin: 3.9 u[IU]/mL

## 2020-11-30 LAB — C-PEPTIDE: C-Peptide: 1.2 ng/mL (ref 0.80–3.85)

## 2020-11-30 MED ORDER — NORTRIPTYLINE HCL 10 MG PO CAPS: 10.0000 mg | ORAL_CAPSULE | Freq: Every day | ORAL | 5 refills | Status: DC

## 2020-11-30 MED ORDER — ONDANSETRON 4 MG PO TBDP: 4.0000 mg | ORAL_TABLET | Freq: Three times a day (TID) | ORAL | 5 refills | Status: AC | PRN

## 2020-11-30 MED ORDER — RIZATRIPTAN BENZOATE 10 MG PO TBDP
ORAL_TABLET | ORAL | 5 refills | Status: DC
Start: 2020-11-30 — End: 2021-02-21

## 2020-11-30 NOTE — Patient Instructions (Signed)
  Start nortriptyline 10mg  at bedtime.  Contact in 4 weeks with update and we can increase dose if needed.  This may also help with sleep and nerve pain Take rizatriptan 10mg  at earliest onset of headache.  May repeat dose once in 2 hours if needed.  Maximum 2 tablets in 24 hours. Take ondansetron dissolvable tablet for nausea Limit use of pain relievers to no more than 2 days out of the week.  These medications include acetaminophen, NSAIDs (ibuprofen/Advil/Motrin, naproxen/Aleve, triptans (Imitrex/sumatriptan), Excedrin, and narcotics.  This will help reduce risk of rebound headaches. Be aware of common food triggers:  - Caffeine:  coffee, black tea, cola, Mt. Dew  - Chocolate  - Dairy:  aged cheeses (brie, blue, cheddar, gouda, [place], provolone, Clarissa, Swiss, etc), chocolate milk, buttermilk, sour cream, limit eggs and yogurt  - Nuts, peanut butter  - Alcohol  - Cereals/grains:  FRESH breads (fresh bagels, sourdough, doughnuts), yeast productions  - Processed/canned/aged/cured meats (pre-packaged deli meats, hotdogs)  - MSG/glutamate:  soy sauce, flavor enhancer, pickled/preserved/marinated foods  - Sweeteners:  aspartame (Equal, Nutrasweet).  Sugar and Splenda are okay  - Vegetables:  legumes (lima beans, lentils, snow peas, fava beans, pinto peans, peas, garbanzo beans), sauerkraut, onions, olives, pickles  - Fruit:  avocados, bananas, citrus fruit (orange, lemon, grapefruit), mango  - Other:  Frozen meals, macaroni and cheese Routine exercise Stay adequately hydrated (aim for 64 oz water daily) Keep headache diary Maintain proper stress management Maintain proper sleep hygiene Do not skip meals Consider supplements:  magnesium citrate 400mg  daily, riboflavin 400mg  daily, coenzyme Q10 100mg  three times daily.

## 2020-12-01 ENCOUNTER — Ambulatory Visit: Payer: BC Managed Care – PPO | Admitting: Physical Therapy

## 2020-12-02 ENCOUNTER — Encounter: Payer: Self-pay | Admitting: Internal Medicine

## 2020-12-02 ENCOUNTER — Encounter: Payer: Self-pay | Admitting: Physical Therapy

## 2020-12-02 ENCOUNTER — Other Ambulatory Visit: Payer: Self-pay

## 2020-12-02 ENCOUNTER — Ambulatory Visit
Payer: BC Managed Care – PPO | Attending: Student in an Organized Health Care Education/Training Program | Admitting: Physical Therapy

## 2020-12-02 DIAGNOSIS — M7918 Myalgia, other site: Secondary | ICD-10-CM

## 2020-12-02 DIAGNOSIS — M6281 Muscle weakness (generalized): Secondary | ICD-10-CM | POA: Diagnosis not present

## 2020-12-02 DIAGNOSIS — R252 Cramp and spasm: Secondary | ICD-10-CM | POA: Insufficient documentation

## 2020-12-02 DIAGNOSIS — F411 Generalized anxiety disorder: Secondary | ICD-10-CM | POA: Diagnosis not present

## 2020-12-02 DIAGNOSIS — R278 Other lack of coordination: Secondary | ICD-10-CM | POA: Diagnosis not present

## 2020-12-02 NOTE — Therapy (Signed)
[facility] [address], [address] [place], [place], [address] Phone: [phone]   Fax:  [phone]  Physical Therapy Evaluation  Patient Details  Name: Brianna [name] MRN: [id number] Date of Birth: 09-10-1995 Referring Provider (PT): Dr. Oswaldo DoneVincent   Encounter Date: 12/02/2020   PT End of Session - 12/02/20 1636     Visit Number 1   7 visits pelvic floor   Date for PT Re-Evaluation 01/27/21    Authorization Type BCBS    PT Start Time 1233    PT Stop Time 1315    PT Time Calculation (min) 42 min    Activity Tolerance Patient limited by pain             Past Medical History:  Diagnosis Date   Asthma    Bronchitis    Depression    Fibromyalgia    Gastric paresis    Keratosis pilaris     Past Surgical History:  Procedure Laterality Date   ESOPHAGOGASTRODUODENOSCOPY ENDOSCOPY     03/2020   SMART PILL PROCEDURE     12/2020   TYMPANOSTOMY TUBE PLACEMENT Bilateral    WISDOM TOOTH EXTRACTION      There were no vitals filed for this visit.    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  Sublux of right hip in the shower when I shave my leg.  Reports general hypermobility;   I have a decline in health over the past year.  Last month diagnosed with fibrolyalgia.  Put on Lyrica.  I see a nutritionist but I'm nauseated so I don't want to eat.  I don't do as much yoga but it worsened.  Neck pain.    Pertinent History gastroparesis I dropped 50#;  now holding 105# but considered 25# underweight (working with nutritionist)    Limitations Walking;Standing;Lifting    How long can you walk comfortably? < 1/2 mile;  walk a lot at work    Patient Stated Goals learn techniques that can help;  I would be doing outdoor activities (hiking, climbing);  I do yoga but stopped recently;   I would like to work in the medical field (I'd like to be a PA)     Currently in Pain? Yes    Pain Score 6     Pain Location Abdomen    Pain Score 6    Pain Location Leg    Pain Orientation Right;Left    Pain Descriptors / Indicators Heaviness    Pain Radiating Towards knees and ankles                [facility] PT Assessment - 12/02/20 0001       Assessment   Medical Diagnosis fibromyalgia    Referring Provider (PT) Dr. Oswaldo DoneVincent    Onset Date/Surgical Date --   1 year   Next MD Visit doctor may have left    Prior Therapy currently receiving pelvic floor therapy      Precautions   Precautions None      Restrictions   Weight Bearing Restrictions No      Balance Screen   Has the patient fallen in the past 6 months No    Has the patient had a decrease in activity level because of a fear of falling?  No    Is the patient reluctant to leave their home because of a fear of falling?  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)    Leisure outdoor activities but not doing lately      Observation/Other Assessments   The [employer] [place] [employer] Osteoarthritis Index [facility])  32% indicating a mild to moderate self perceived disability   most impaired:weight bearing, morning stiffness and on/off the toilet     AROM   Overall AROM  --   Able to do a full squat (bottom touching heels)   Overall AROM Comments UE ROM WNLS; full cervical ROM 90 degrees rotation, extension, sidebending      Strength   Overall Strength Comments Able to single leg stand 30 sec right/left no pelvic drop; decreased core strength with decreased activation of transverse abdominals, multifidi, scapular depressors 4/5                        Objective measurements completed on examination: See above findings.                PT Education - 12/02/20 1352     Education Details aquatic PT info    Person(s) Educated Patient    Methods Handout;Explanation    Comprehension Verbalized understanding              PT Short Term Goals - 11/18/20 5621       PT SHORT TERM GOAL #1   Title patient is independent with initial HEP    Period Weeks    Status Achieved      PT SHORT TERM GOAL #2   Title patient is able to use her dilator/wand correctly to reduce trigger points    Time 4    Period Weeks    Status Achieved      PT SHORT TERM GOAL #3   Title patient is able to perform diaphragmatic breathing to bulge the pelvic floor    Time 4    Period Weeks    Status Achieved      PT SHORT TERM GOAL #4   Title patient understands ways to calm the nervous system to reduce her pain    Time 4    Period Weeks    Status Achieved      PT SHORT TERM GOAL #5   Title patient reports her pain intensity is reduced 25% of the day    Time 4    Period Weeks    Status Achieved               PT Long Term Goals - 12/02/20 1654       PT LONG TERM GOAL #5   Title The patient will be independent in an aquatic ex program    Time 8    Period Weeks    Status New      Additional Long Term Goals   Additional Long Term Goals Yes      PT LONG TERM GOAL #6   Title The patient will have an improved WOMAC score to 25% or less    Time 8    Period Weeks    Status New      PT LONG TERM GOAL #7   Title The patient will be able  to walk 1/2 mile with pain level 4/10    Time 8    Period Weeks    Status New      PT LONG TERM GOAL #8   Title The patient will report 30% less discomfort and fatigue with weight bearing, morning stiffness and getting on/off the toilet ("moderate rating" on WOMAC instead of "very difficult"    Time 8    Period Weeks    Status New                    Plan - 12/02/20 1643     Clinical Impression Statement The patient is currently receiving pelvic  floor PT and is now referred for evaluation and treatment of fibromyalgia.  She reports a decline in health over the past year including a significant weight loss due to GI issues.  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  She has pain in most body regions.  She has full ROM (hypermobile in several joints) throughout neck, UEs, lumbar region and LEs.  She is able to do a full squat to the floor.  All motions are painful.   She does have core weakness particularly in cervical deep flexors, scapular degressors, lower abdominals and lumbar multifidi grossly 4/5.  Low tolerance to mobility and strength testing secondary to pain.  She has discontinued yoga because she felt it made her worse.   Her mom has a pool but she states she doesn't know what to do. Discussed intiating aquatic PT to establish an ex program in the water (anticipated better tolerance than land based).    Personal Factors and Comorbidities Time since onset of injury/illness/exacerbation;Finances;Sex;Profession;Past/Current Experience    Examination-Activity Limitations Locomotion Level;Toileting;Stand    Examination-Participation Restrictions Occupation;Community Activity;Interpersonal Relationship    Stability/Clinical Decision Making Stable/Uncomplicated    Clinical Decision Making Low    Rehab Potential Good    PT Frequency 1x / week    PT Duration 8 weeks    PT Treatment/Interventions ADLs/Self Care Home Management;Biofeedback;Cryotherapy;Electrical Stimulation;Iontophoresis 4mg /ml Dexamethasone;Moist Heat;Ultrasound;Neuromuscular re-education;Therapeutic exercise;Therapeutic activities;Patient/family education;Manual techniques;Dry needling;Taping;Spinal Manipulations;Joint Manipulations;Aquatic Therapy    PT Next Visit Plan recommend aquatic PT for pain control to establish a program she can do in her mom's pool; encourage nutrition for ex; patient education on pain,  graded exposure to exercise    PT Home Exercise Plan Access Code: [email or web address]             Patient will benefit from skilled therapeutic intervention in order to improve the following deficits and impairments:  Decreased coordination, Increased fascial restricitons, Decreased endurance, Decreased activity tolerance, Dizziness, Pain, Decreased strength  Visit Diagnosis: Muscle weakness (generalized) - Plan: PT plan of care cert/re-cert  Myofascial muscle pain - Plan: PT plan of care cert/re-cert     Problem List Patient Active Problem List   Diagnosis Date Noted   Headache 11/30/2020   Positive ANA (antinuclear antibody) 11/15/2020   Fibromyalgia 11/02/2020   Hair loss 11/02/2020   Muscle spasticity 10/22/2020   Rash and other nonspecific skin eruption 10/22/2020   Paresthesia of bilateral legs 10/06/2020   Myalgia 10/06/2020   Chronic pelvic pain in female 10/06/2020   Chronic abdominal pain 10/06/2020   IBS (irritable bowel syndrome) 08/02/2020   Pelvic floor dysfunction 08/02/2020   Irritant dermatitis 08/02/2020   Gastroesophageal reflux disease 06/28/2020   Severe recurrent major depression without psychotic features (HCC) 06/28/2020   Polyarthralgia 06/28/2020  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 06/28/2020   Tachycardia 06/28/2020   History of COVID-19 06/28/2020   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 deficit 06/28/2020   Recurrent major depressive disorder, in full remission (HCC) 06/28/2020   Atypical chest pain 06/22/2020   Dizziness 06/22/2020   SOB (shortness of breath) 06/22/2020   Palpitations 06/14/2020   Anxiety 05/04/2020   Internal and external hemorrhoids without complication 04/29/2020   Acute recurrent pansinusitis 10/21/2019   ETD (Eustachian tube dysfunction), bilateral 10/21/2019   Cat allergy due to both airborne and skin contact 05/01/2016   Chronic sore throat 05/01/2016   Acne vulgaris 03/11/2014   Mild  intermittent asthma without complication 03/11/2014   Seasonal allergic rhinitis 03/11/2014   Seasonal allergies 03/11/2014   Lavinia Sharps, PT 12/02/20 5:04 PM Phone: [phone] Fax: [phone]  Vivien Presto 12/02/2020, 5:02 PM  [facility] [address], [address] [place], [place], [address] Phone: [phone]   Fax:  [phone]  Name: Talayia Hjort MRN: [id number] Date of Birth: 1996-03-17

## 2020-12-02 NOTE — Patient Instructions (Signed)
     [facility] Welcome to [facility]! Here you will find all the information you will need regarding your pool therapy. If you have further questions at any time, please call our office at [phone]. After completing your initial evaluation in the [facility], you may be eligible to complete a portion of your therapy in the pool. A typical week of therapy will consist of 1-2 typical physical therapy visits at our [facility] location and an additional session of therapy in the pool located at the [facility] at [address]. [address], [place] [address]. The phone number at the pool site is [phone]. Please call this number if you are running late or need to cancel your appointment.  Aquatic therapy will be offered on Friday afternoons. Each session will last approximately 45 minutes. All scheduling and payments for aquatic therapy sessions, including cancelations, will be done through our [facility] location.  To be eligible for aquatic therapy, these criteria must be met: You must be able to independently change in the locker room and get to the pool deck. A caregiver can come with you to help if needed. There are bleachers for a caregiver to sit on next to the pool. No one with an open wound is permitted in the pool.  Handicap parking is available in the front and there is a drop off option for even closer accessibility. Please arrive 15 minutes prior to your appointment to prepare for your pool session. You must sign in at the front desk upon your arrival. Please be sure to attend to any toileting needs prior to entering the pool. Locker rooms for changing are located to the right of the check-in desk. There is direct access to the pool deck from the locker room. You can lock your belongings in a locker but must bring a lock. Your therapist will greet you on the pool deck. There may be other swimmers in the pool at the  same time but your session is one-on-one with the therapist.   

## 2020-12-06 ENCOUNTER — Other Ambulatory Visit: Payer: Self-pay

## 2020-12-06 ENCOUNTER — Ambulatory Visit: Payer: BC Managed Care – PPO | Admitting: Behavioral Health

## 2020-12-06 ENCOUNTER — Ambulatory Visit: Payer: BC Managed Care – PPO | Attending: Internal Medicine | Admitting: Physical Therapy

## 2020-12-06 ENCOUNTER — Encounter: Payer: Self-pay | Admitting: Physical Therapy

## 2020-12-06 DIAGNOSIS — F331 Major depressive disorder, recurrent, moderate: Secondary | ICD-10-CM

## 2020-12-06 DIAGNOSIS — M7918 Myalgia, other site: Secondary | ICD-10-CM | POA: Insufficient documentation

## 2020-12-06 DIAGNOSIS — R278 Other lack of coordination: Secondary | ICD-10-CM | POA: Insufficient documentation

## 2020-12-06 DIAGNOSIS — R252 Cramp and spasm: Secondary | ICD-10-CM | POA: Insufficient documentation

## 2020-12-06 DIAGNOSIS — M6281 Muscle weakness (generalized): Secondary | ICD-10-CM | POA: Insufficient documentation

## 2020-12-06 DIAGNOSIS — F411 Generalized anxiety disorder: Secondary | ICD-10-CM

## 2020-12-06 DIAGNOSIS — Z789 Other specified health status: Secondary | ICD-10-CM

## 2020-12-06 NOTE — Patient Instructions (Signed)
Access Code: [email or web address] URL: [email or web address] Date: 12/06/2020 Prepared by: Eulis Foster  Exercises Supine Hamstring Stretch with Strap - 1 x daily - 7 x weekly - 1 sets - 2 reps - 30 sec hold Supine ITB Stretch with Strap - 1 x daily - 7 x weekly - 1 sets - 2 reps - 30 sec hold Hip Adductors and Hamstring Stretch with Strap - 1 x daily - 7 x weekly - 1 sets - 2 reps - 30 sec hold Supine Pelvic Floor Stretch - 1 x daily - 7 x weekly - 1 sets - 1 reps - 1 in hold Hip Hinge Rock Back - 1 x daily - 3 x weekly - 1 sets - 10 reps Standing Hip Hinge - 1 x daily - 3 x weekly - 1 sets - 10 reps Prone Hip Extension with Plantarflexion - 1 x daily - 3 x weekly - 1 sets - 10 reps Prone Hip Abduction on Slider - 1 x daily - 3 x weekly - 1 sets - 10 reps Supine Hip Adduction Isometric with Ball - 1 x daily - 7 x weekly - 1 sets - 10 reps - 5 sec hold Supine Shoulder Flexion with Dowel - 1 x daily - 7 x weekly - 1 sets - 10 reps Scapula Isolations - 1 x daily - 7 x weekly - 3 sets - 10 reps Supine Pelvic Floor Contraction - 1 x daily - 7 x weekly - 1 sets - 10 reps - 5 sec hold [facility] [address] [place], [place] [address] Phone # [phone] Fax [phone]'

## 2020-12-06 NOTE — Therapy (Signed)
[facility] [address], [address] [place], [place], [address] Phone: [phone]   Fax:  [phone]  Physical Therapy Treatment  Patient Details  Name: Brianna [name] MRN: [id number] Date of Birth: Feb 28, 1996 Referring Provider (PT): Dr. Oswaldo Done   Encounter Date: 12/06/2020   PT End of Session - 12/06/20 1229     Visit Number 8   1 fibro, 8 pelvic floor   Date for PT Re-Evaluation 67/89/38   cert date for fibro is 01/27/2021   Authorization Type BCBS    PT Start Time 1145    PT Stop Time 1223    PT Time Calculation (min) 38 min    Activity Tolerance Patient tolerated treatment well;No increased pain    Behavior During Therapy [facility] for tasks assessed/performed             Past Medical History:  Diagnosis Date   Asthma    Bronchitis    Depression    Fibromyalgia    Gastric paresis    Keratosis pilaris     Past Surgical History:  Procedure Laterality Date   ESOPHAGOGASTRODUODENOSCOPY ENDOSCOPY     03/2020   SMART PILL PROCEDURE     12/2020   TYMPANOSTOMY TUBE PLACEMENT Bilateral    WISDOM TOOTH EXTRACTION      There were no vitals filed for this visit.   Subjective Assessment - 12/06/20 1149     Subjective The pelvic floor is improving. I do not feel the constant tightness. I feel like I am gripping in the pelvic floor. The back pain is improving. My current diagnosis is Migraines, gastroparesis, fibromyalgia    Pertinent History gastroparesis I dropped 50#;  now holding 105# but considered 25# underweight (working with nutritionist)    Limitations Walking;Standing;Lifting    How long can you walk comfortably? < 1/2 mile;  walk a lot at work    Patient Stated Goals reduce pelvic floor pain and increase strength. learn techniques that can help;  I would be doing outdoor activities (hiking, climbing);  I do yoga but stopped recently;   I would like to work in the medical field (I'd like to be a PA)     Currently in Pain? Yes    Pain Score 6     Pain Location Abdomen    Pain Orientation Right;Left    Pain Descriptors / Indicators Aching;Spasm;Stabbing    Pain Type Chronic pain    Pain Radiating Towards abdominal and pelvic, floor and legs    Pain Onset More than a month ago    Pain Frequency Constant    Aggravating Factors  not moving, moving in a certain direction, pressure on the bones    Pain Relieving Factors hot packs    Effect of Pain on Daily Activities hurts to walk and sitting for long periods    Multiple Pain Sites Yes    Pain Score 3    Pain Location Leg    Pain Orientation Right;Left    Pain Descriptors / Indicators Discomfort    Pain Type Chronic pain    Pain Onset More than a month ago    Pain Frequency Constant    Aggravating Factors  laying on right side, moving for too long or not moving for too long    Pain Relieving Factors heat    Pain Score 0  [facility] Adult PT Treatment/Exercise - 12/06/20 0001       Self-Care   Self-Care Other Self-Care Comments    Other Self-Care Comments  discussed wtih patient on her other diagnosis and how she is looking into genetic testing to see if she has EDS; gave her 2 names she can look at after googling on the computer      Lumbar Exercises: Supine   Ab Set 10 reps;5 seconds    AB Set Limitations with ball squeeze    Other Supine Lumbar Exercises hookly with pelvic floor contraction holding for 5 sec 10x followed by 5 quicks; hookly with ball squeeze and holding yellow band at shoulder height moving the arms side to side and not move the pelvis; hookly holding the yellow band and moving both arms overhead and back.                    PT Education - 12/06/20 1228     Education Details Access Code: [email or web address]    Person(s) Educated Patient    Methods Explanation;Demonstration;Verbal cues;Handout    Comprehension Returned demonstration;Verbalized understanding               PT Short Term Goals - 11/18/20 2706       PT SHORT TERM GOAL #1   Title patient is independent with initial HEP    Period Weeks    Status Achieved      PT SHORT TERM GOAL #2   Title patient is able to use her dilator/wand correctly to reduce trigger points    Time 4    Period Weeks    Status Achieved      PT SHORT TERM GOAL #3   Title patient is able to perform diaphragmatic breathing to bulge the pelvic floor    Time 4    Period Weeks    Status Achieved      PT SHORT TERM GOAL #4   Title patient understands ways to calm the nervous system to reduce her pain    Time 4    Period Weeks    Status Achieved      PT SHORT TERM GOAL #5   Title patient reports her pain intensity is reduced 25% of the day    Time 4    Period Weeks    Status Achieved               PT Long Term Goals - 12/06/20 1243       PT LONG TERM GOAL #1   Title Patient is independent with advanced HEP    Time 4    Period Months    Status On-going      PT LONG TERM GOAL #2   Title pelvic pain decreased </= 3/10 50-60% of the time due to reduction of trigger points    Baseline patient reports it is decreased but did not give a percentage    Time 4    Period Months    Status On-going      PT LONG TERM GOAL #3   Title patient is able to fully empty her bowels due to improve pelvic floor coordination during a bowel movement    Baseline not able to fully empty, still feels bloated    Time 4    Period Months    Status On-going      PT LONG TERM GOAL #4   Title patient is able to fully empty her bladder and is able to wait  2-3 hours to urinate due to relaxation of the pelvic floor    Baseline empty the bladder, still has pressure    Time 4    Period Months    Status Achieved                   Plan - 12/06/20 1234     Clinical Impression Statement Patient reports her pelvic and back pain are better than when she was initially evaluated. She did not have trigger points in  the pelvic floor last visit so just worked on strengtheing. Patient was able to do the exercises without increase in back pain. Patient is still working on finding out reasons for some of her symptoms so she is going to look into someone to do genetic testing. Therapist gave patient some names. Patient will benefit from skilled therapy to improve pelvic floor strength and coordination.    Personal Factors and Comorbidities Time since onset of injury/illness/exacerbation;Finances;Sex;Profession;Past/Current Experience    Examination-Activity Limitations Locomotion Level;Toileting;Stand    Examination-Participation Restrictions Occupation;Community Activity;Interpersonal Relationship    Stability/Clinical Decision Making Stable/Uncomplicated    Rehab Potential Excellent    PT Frequency 1x / week    PT Duration Other (comment)   4 months   PT Treatment/Interventions ADLs/Self Care Home Management;Biofeedback;Cryotherapy;Electrical Stimulation;Iontophoresis 4mg /ml Dexamethasone;Moist Heat;Ultrasound;Neuromuscular re-education;Therapeutic exercise;Therapeutic activities;Patient/family education;Manual techniques;Dry needling;Taping;Spinal Manipulations;Joint Manipulations;Aquatic Therapy    PT Next Visit Plan recommend aquatic PT for pain control to establish a program she can do in her mom's pool; encourage nutrition for ex; patient education on pain, graded exposure to exercise for fibromyalgia and Plan for pelvic floor is to further increase pelvic floor strength and monitor for pain so manual work when needed    PT Home Exercise Plan Access Code: [email or web address]    Consulted and Agree with Plan of Care Patient             Patient will benefit from skilled therapeutic intervention in order to improve the following deficits and impairments:  Decreased coordination, Increased fascial restricitons, Decreased endurance, Decreased activity tolerance, Dizziness, Pain, Decreased strength  Visit  Diagnosis: Muscle weakness (generalized)  Myofascial muscle pain  Cramp and spasm  Other lack of coordination     Problem List Patient Active Problem List   Diagnosis Date Noted   Headache 11/30/2020   Positive ANA (antinuclear antibody) 11/15/2020   Fibromyalgia 11/02/2020   Hair loss 11/02/2020   Muscle spasticity 10/22/2020   Rash and other nonspecific skin eruption 10/22/2020   Paresthesia of bilateral legs 10/06/2020   Myalgia 10/06/2020   Chronic pelvic pain in female 10/06/2020   Chronic abdominal pain 10/06/2020   IBS (irritable bowel syndrome) 08/02/2020   Pelvic floor dysfunction 08/02/2020   Irritant dermatitis 08/02/2020   Gastroesophageal reflux disease 06/28/2020   Severe recurrent major depression without psychotic features (HCC) 06/28/2020   Polyarthralgia 06/28/2020   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 06/28/2020   Tachycardia 06/28/2020   History of COVID-19 06/28/2020   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 deficit 06/28/2020   Recurrent major depressive disorder, in full remission (HCC) 06/28/2020   Atypical chest pain 06/22/2020   Dizziness 06/22/2020   SOB (shortness of breath) 06/22/2020   Palpitations 06/14/2020   Anxiety 05/04/2020   Internal and external hemorrhoids without complication 04/29/2020   Acute recurrent pansinusitis 10/21/2019   ETD (Eustachian tube dysfunction), bilateral 10/21/2019   Cat allergy due to both airborne and skin contact 05/01/2016   Chronic sore throat 05/01/2016   Acne vulgaris  03/11/2014   Mild intermittent asthma without complication 03/11/2014   Seasonal allergic rhinitis 03/11/2014   Seasonal allergies 03/11/2014    Eulis Fosterheryl Catheryn Slifer, PT 12/06/20 12:46 PM  [facility] [address], [address] [place], [place], [address] Phone: [phone]   Fax:  [phone]  Name: Brianna [name] MRN: [id number] Date of Birth:  1995/09/22

## 2020-12-08 ENCOUNTER — Ambulatory Visit: Payer: BC Managed Care – PPO | Admitting: Physical Therapy

## 2020-12-08 ENCOUNTER — Other Ambulatory Visit: Payer: Self-pay

## 2020-12-08 ENCOUNTER — Encounter: Payer: Self-pay | Admitting: Physical Therapy

## 2020-12-08 DIAGNOSIS — M7918 Myalgia, other site: Secondary | ICD-10-CM

## 2020-12-08 DIAGNOSIS — F411 Generalized anxiety disorder: Secondary | ICD-10-CM | POA: Diagnosis not present

## 2020-12-08 DIAGNOSIS — R252 Cramp and spasm: Secondary | ICD-10-CM

## 2020-12-08 DIAGNOSIS — M6281 Muscle weakness (generalized): Secondary | ICD-10-CM

## 2020-12-08 DIAGNOSIS — R278 Other lack of coordination: Secondary | ICD-10-CM | POA: Diagnosis not present

## 2020-12-08 NOTE — Therapy (Signed)
[facility] [address], [address] [place], [place], [address] Phone: [phone]   Fax:  [phone]  Physical Therapy Treatment  Patient Details  Name: Brianna [name] MRN: [id number] Date of Birth: November 30, 1995 Referring Provider (PT): Dr. Oswaldo Done   Encounter Date: 12/08/2020   PT End of Session - 12/08/20 1208     Visit Number 9    Date for PT Re-Evaluation 02/07/21    Authorization Type BCBS    PT Start Time 0930    PT Stop Time 1015    PT Time Calculation (min) 45 min    Activity Tolerance Patient tolerated treatment well    Behavior During Therapy [facility] for tasks assessed/performed             Past Medical History:  Diagnosis Date   Asthma    Bronchitis    Depression    Fibromyalgia    Gastric paresis    Keratosis pilaris     Past Surgical History:  Procedure Laterality Date   ESOPHAGOGASTRODUODENOSCOPY ENDOSCOPY     03/2020   SMART PILL PROCEDURE     12/2020   TYMPANOSTOMY TUBE PLACEMENT Bilateral    WISDOM TOOTH EXTRACTION      There were no vitals filed for this visit.   Subjective Assessment - 12/08/20 1205     Subjective Having a little nausea this AM, RT hip always feels like it is overworking.    Pertinent History gastroparesis I dropped 50#;  now holding 105# but considered 25# underweight (working with nutritionist)    Currently in Pain? Yes    Pain Score --   Not given   Pain Location Hip    Pain Orientation Right    Pain Descriptors / Indicators Dull;Aching    Multiple Pain Sites No            Treatment: Aquatics: Patient seen for aquatic therapy today.  Treatment took place in water 2.5-4 feet deep depending upon activity.  Pt entered the pool via stairs, water temp 90 degrees F.  Seated water bench with 75% submersion Pt performed seated LE AROM exercises 20x in all planes, PTA concurrently educated pt on water principles and how we would modify incase of connective  disorder.  Mid waist depth standing: Pain free ROM for Bil hip circumduction 10x, hip flex/ext 10x. Muitlcolored UE weights for gentle shoulder horizontal add, abd, no UE for shoulder flex/ext  Side stepping 4 lengths, about 5-10 feet. Forward/backward steps same distance 4x each  5 min decompression seat with thick noodle behind pt, some increased discomfort to Rt hip. Changed to more horizontal position with floatation x 3 min, this was better.                            PT Education - 12/08/20 1207     Education Details Water principles general, water principles in accordance with potention connective disorder    Person(s) Educated Patient    Methods Explanation    Comprehension Verbalized understanding;Returned demonstration              PT Short Term Goals - 11/18/20 0938       PT SHORT TERM GOAL #1   Title patient is independent with initial HEP    Period Weeks    Status Achieved      PT SHORT TERM GOAL #2   Title patient is able to use her dilator/wand correctly to reduce trigger points  Time 4    Period Weeks    Status Achieved      PT SHORT TERM GOAL #3   Title patient is able to perform diaphragmatic breathing to bulge the pelvic floor    Time 4    Period Weeks    Status Achieved      PT SHORT TERM GOAL #4   Title patient understands ways to calm the nervous system to reduce her pain    Time 4    Period Weeks    Status Achieved      PT SHORT TERM GOAL #5   Title patient reports her pain intensity is reduced 25% of the day    Time 4    Period Weeks    Status Achieved               PT Long Term Goals - 12/06/20 1243       PT LONG TERM GOAL #1   Title Patient is independent with advanced HEP    Time 4    Period Months    Status On-going      PT LONG TERM GOAL #2   Title pelvic pain decreased </= 3/10 50-60% of the time due to reduction of trigger points    Baseline patient reports it is decreased but did not  give a percentage    Time 4    Period Months    Status On-going      PT LONG TERM GOAL #3   Title patient is able to fully empty her bowels due to improve pelvic floor coordination during a bowel movement    Baseline not able to fully empty, still feels bloated    Time 4    Period Months    Status On-going      PT LONG TERM GOAL #4   Title patient is able to fully empty her bladder and is able to wait 2-3 hours to urinate due to relaxation of the pelvic floor    Baseline empty the bladder, still has pressure    Time 4    Period Months    Status Achieved                   Plan - 12/08/20 1209     Clinical Impression Statement Pt arrives for first aquatic PT session with mild hip pain. Pt wa educated in water principles in general as well as what approach we will  take in case of connective disorder such as EDS. Pt generally tolerated aquatic exercises without any adverse effects although RT hip did become more strained as treatment went on. pt appears to tolerate horizontal, supportive positions for pain reduction vs vertical. Pt was able to keep her RO within ranges that decreased popping and/or pain.    Personal Factors and Comorbidities Time since onset of injury/illness/exacerbation;Finances;Sex;Profession;Past/Current Experience    Examination-Activity Limitations Locomotion Level;Toileting;Stand    Examination-Participation Restrictions Occupation;Community Activity;Interpersonal Relationship    Stability/Clinical Decision Making Stable/Uncomplicated    Rehab Potential Excellent    PT Frequency 1x / week    PT Duration Other (comment)    PT Treatment/Interventions ADLs/Self Care Home Management;Biofeedback;Cryotherapy;Electrical Stimulation;Iontophoresis 4mg /ml Dexamethasone;Moist Heat;Ultrasound;Neuromuscular re-education;Therapeutic exercise;Therapeutic activities;Patient/family education;Manual techniques;Dry needling;Taping;Spinal Manipulations;Joint  Manipulations;Aquatic Therapy    PT Next Visit Plan See how she tolerated todays aquatic session and make any modifications if needed.    PT Home Exercise Plan Access Code: [email or web address]    Consulted and Agree with Plan of Care Patient  Patient will benefit from skilled therapeutic intervention in order to improve the following deficits and impairments:  Decreased coordination, Increased fascial restricitons, Decreased endurance, Decreased activity tolerance, Dizziness, Pain, Decreased strength  Visit Diagnosis: Muscle weakness (generalized)  Myofascial muscle pain  Cramp and spasm     Problem List Patient Active Problem List   Diagnosis Date Noted   Headache 11/30/2020   Positive ANA (antinuclear antibody) 11/15/2020   Fibromyalgia 11/02/2020   Hair loss 11/02/2020   Muscle spasticity 10/22/2020   Rash and other nonspecific skin eruption 10/22/2020   Paresthesia of bilateral legs 10/06/2020   Myalgia 10/06/2020   Chronic pelvic pain in female 10/06/2020   Chronic abdominal pain 10/06/2020   IBS (irritable bowel syndrome) 08/02/2020   Pelvic floor dysfunction 08/02/2020   Irritant dermatitis 08/02/2020   Gastroesophageal reflux disease 06/28/2020   Severe recurrent major depression without psychotic features (HCC) 06/28/2020   Polyarthralgia 06/28/2020   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 06/28/2020   Tachycardia 06/28/2020   History of COVID-19 06/28/2020   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 deficit 06/28/2020   Recurrent major depressive disorder, in full remission (HCC) 06/28/2020   Atypical chest pain 06/22/2020   Dizziness 06/22/2020   SOB (shortness of breath) 06/22/2020   Palpitations 06/14/2020   Anxiety 05/04/2020   Internal and external hemorrhoids without complication 04/29/2020   Acute recurrent pansinusitis 10/21/2019   ETD (Eustachian tube dysfunction), bilateral 10/21/2019   Cat allergy due to both  airborne and skin contact 05/01/2016   Chronic sore throat 05/01/2016   Acne vulgaris 03/11/2014   Mild intermittent asthma without complication 03/11/2014   Seasonal allergic rhinitis 03/11/2014   Seasonal allergies 03/11/2014    Mattheus Rauls, PTA 12/08/2020, 12:15 PM  [facility] [address] [place], [place], [address] Phone: [phone]   Fax:  [phone]  Name: Andrya Roppolo MRN: [id number] Date of Birth: 1996-05-18

## 2020-12-08 NOTE — BH Specialist Note (Signed)
Integrated Behavioral Health via Telemedicine Visit  12/08/2020 Brianna [name] [id number]  Number of Integrated Behavioral Health visits: 7 Session Start time: 2:00pm  Session End time: 2:50pm Total time: 50   Referring Provider: Dr. Merrilyn Puma, MD Patient/Family location: Pt is home in private [facility] Provider location: [facility] Office All persons participating in visit: Pt & Clinician Types of Service: Individual psychotherapy  I connected with [name] and/or Revonda Standard Yankovich's  self  via  Telephone or Engineer, civil (consulting)  (Video is Surveyor, mining) and verified that I am speaking with the correct person using two identifiers. Discussed confidentiality:  7th visit  I discussed the limitations of telemedicine and the availability of in person appointments.  Discussed there is a possibility of technology failure and discussed alternative modes of communication if that failure occurs.  I discussed that engaging in this telemedicine visit, they consent to the provision of behavioral healthcare and the services will be billed under their insurance.  Patient and/or legal guardian expressed understanding and consented to Telemedicine visit:  7th visit  Presenting Concerns: Patient and/or family reports the following symptoms/concerns: elevated anx/dep due to health status changes that continue Duration of problem: months; Severity of problem: moderate to severe  Patient and/or Family's Strengths/Protective Factors: Social and Emotional competence, Concrete supports in place (healthy food, safe environments, etc.), Sense of purpose, and Physical Health (exercise, healthy diet, medication compliance, etc.)  Goals Addressed: Patient will:  Reduce symptoms of: anxiety, depression, stress, and disturbances of sleep    Increase knowledge and/or ability of: coping skills, healthy habits, stress reduction, and axniety reduction tools    Demonstrate  ability to: Increase healthy adjustment to current life circumstances and learn to manage anxiety in order to moderate Px health Sx that are disruptive & triggering  Progress towards Goals: Ongoing  Interventions: Interventions utilized:  Solution-Focused Strategies, Behavioral Activation, Supportive Counseling, and Psychoeducation and/or Health Education Standardized Assessments completed:  screeners prn  Patient and/or Family Response: Pt receptive to call today & reequests Referrals to outside resources for cont'd TIC  Assessment: Patient currently experiencing displeasure w/lack of answers she is getting about her health. Pt sts, "Life is better now than it was 4 mos ago; crying ~2-3 X per week now". This is likely due to heightened emotionality normally manifest during initial stages of TIC. Amt of crying is also a reduction from 4 mos ago per Pt report. Pt sts, "I live in anxiety."  Pt & Clinician have previously discussed the relationship btwn Px Sx & anxiety. Px & Mtl health impact ea other.   Pt is certain she has PCOS. There is a Mat Hx of this syndrome & Pt is well-researched on the particulars of presentation.   Pt is concerned for her weight. She is 5'4" & weighs 105#. Pt weight tables advise ranging up to 128#.  Patient may benefit from cont'd tools for anx/dep & her health concerns. Pt may need to write 10-3X5 cards that she can carry & read to subdue her anxiety & retrain her brain to channel alternative thoughts. This can be done for 30+ days & a difference will be apparent.  Pt is overwhelmed by [facility] assignment to organize/manage her Tx Providers. This task may need to be done in stages to reduce the incident of psychological overwhelm in which the brain becomes flooded & can cause blocking.   Plan: Follow up with behavioral health clinician on : 2-3 wks on telehealth for 60 min Behavioral recommendations: Give yourself short times  to work on task for [employer].  Worry less by engaging in activities that bring you comfort, joy, & freedom.  Engage worry-know it is part of you-don't resist it, befriend it-name it & call it out! Externalize your worry & release its hold over your brain.  Don not overdo therapy. Less is more-pick your Psychotherapist & stick wthe choice you make.  Referral(s):  [facility]  I discussed the assessment and treatment plan with the patient and/or parent/guardian. They were provided an opportunity to ask questions and all were answered. They agreed with the plan and demonstrated an understanding of the instructions.   They were advised to call back or seek an in-person evaluation if the symptoms worsen or if the condition fails to improve as anticipated.  [name], LMFT

## 2020-12-14 ENCOUNTER — Other Ambulatory Visit: Payer: Self-pay

## 2020-12-14 ENCOUNTER — Ambulatory Visit (INDEPENDENT_AMBULATORY_CARE_PROVIDER_SITE_OTHER): Payer: BC Managed Care – PPO | Admitting: Sports Medicine

## 2020-12-14 VITALS — BP 108/70 | Ht 64.0 in | Wt 105.0 lb

## 2020-12-14 DIAGNOSIS — M25551 Pain in right hip: Secondary | ICD-10-CM | POA: Diagnosis not present

## 2020-12-15 ENCOUNTER — Telehealth: Payer: Self-pay | Admitting: *Deleted

## 2020-12-15 ENCOUNTER — Ambulatory Visit: Payer: BC Managed Care – PPO | Admitting: Physical Therapy

## 2020-12-15 ENCOUNTER — Telehealth: Payer: Self-pay | Admitting: Behavioral Health

## 2020-12-15 ENCOUNTER — Encounter: Payer: Self-pay | Admitting: Physical Therapy

## 2020-12-15 DIAGNOSIS — R278 Other lack of coordination: Secondary | ICD-10-CM

## 2020-12-15 DIAGNOSIS — M7918 Myalgia, other site: Secondary | ICD-10-CM

## 2020-12-15 DIAGNOSIS — R252 Cramp and spasm: Secondary | ICD-10-CM

## 2020-12-15 DIAGNOSIS — M6281 Muscle weakness (generalized): Secondary | ICD-10-CM | POA: Diagnosis not present

## 2020-12-15 NOTE — Progress Notes (Signed)
   Subjective:    [name], female    DOB: [birth year], 25 y.o.   MRN: [id number]  HPI chief complaint: Right hip pain  Brianna [name] is a very pleasant 25 year old female who presents today with concerns about possible EDS.  She is currently working with the physical therapist and doing primarily aquatic exercises.  Her physical therapist recommended that she see me today for evaluation of right hip pain. Brianna [name] tells me that she has had popping in her right hip most of her life.  Was only recently that it started to become painful.  She feels like the hip will dislocate.  This can happen with simply rotating on her right leg.  She has seen a rheumatologist at [facility] and remains under their care.  She was told that she may have fibromyalgia but they have scheduled a nerve conduction study test to be done at some point in the future.  In addition to the right hip popping she does endorse some neuropathic pain in both legs.  She is currently on nortriptyline 25 mg daily which she just started about a week ago.  She also takes Lyrica.  She has never been told that she may be hypermobile but she is questioning whether or not she may in fact have Ehlers-Danlos syndrome.  Past medical history reviewed Medications reviewed Allergies reviewed    Review of Systems As above    Objective:   Physical Exam  Well-developed, well-nourished.  No acute distress.  Evaluation for hypermobility shows a Beighton score of 4/9.  Examination of her right hip shows smooth painless hip range of motion with a negative logroll.  She does have weakness with hip abduction.  Negative Trendelenburg.  Neurovascularly intact distally.      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  It is well-known that hypermobile patients have  exaggerated ranges of motion and pushing them beyond the normal range can cause pain and injury.  We will also provide Brianna [name] with some information regarding Ehlers-Danlos genetic testing.  Although the hypermobile type of Ehlers-Danlos is not diagnosed via blood test, she does have some other signs and symptoms that may suggest other variants so I think that testing is reasonable.  She will follow-up with me again in 6 weeks.

## 2020-12-15 NOTE — Therapy (Signed)
[facility] [address], [address] [place], [place], [address] Phone: [phone]   Fax:  [phone]  Physical Therapy Treatment  Patient Details  Name: Brianna [name] MRN: [id number] Date of Birth: September 04, 1995 Referring Provider (PT): Dr. Oswaldo Done   Encounter Date: 12/15/2020   PT End of Session - 12/15/20 1321     Visit Number 10    Date for PT Re-Evaluation 02/07/21    Authorization Type BCBS    PT Start Time 0935    PT Stop Time 1015    PT Time Calculation (min) 40 min    Activity Tolerance Patient tolerated treatment well;Patient limited by fatigue    Behavior During Therapy [facility] for tasks assessed/performed             Past Medical History:  Diagnosis Date   Asthma    Bronchitis    Depression    Fibromyalgia    Gastric paresis    Keratosis pilaris     Past Surgical History:  Procedure Laterality Date   ESOPHAGOGASTRODUODENOSCOPY ENDOSCOPY     03/2020   SMART PILL PROCEDURE     12/2020   TYMPANOSTOMY TUBE PLACEMENT Bilateral    WISDOM TOOTH EXTRACTION      There were no vitals filed for this visit.   Subjective Assessment - 12/15/20 1318     Subjective Did ok after the pool workout, RT hip was sore. Pt saw Dr Margaretha Sheffield, hip is hypermobile.    Pertinent History gastroparesis I dropped 50#;  now holding 105# but considered 25# underweight (working with nutritionist)    Limitations Walking;Standing;Lifting    Patient Stated Goals reduce pelvic floor pain and increase strength. 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 Yes    Pain Score 1     Pain Location Hip    Pain Orientation Right    Pain Descriptors / Indicators Dull    Aggravating Factors  Exercising the hip too much can irritate it.    Pain Relieving Factors heat    Multiple  Pain Sites No            Treatment: Patient seen for aquatic therapy today.  Treatment took place in water 2.5-4 feet deep depending upon activity.  Pt entered  & exited the pool via staris, water temp 92 degrees.  Seated water bench with 75% submersion Pt performed seated LE AROM exercises 20x in all planes, pain assessment concurrent.  Mid waist depth: Instructed pt in standing hip isometrics 5x 3 sec hold RTLE. Water walking 4x short length of pool. All directions. RT side stepping painful. Shoulder/upper back horizontal abd with multicolored UE wts, 20x, moved deeper to submerge more of her shoulders.   Seated decompression float with feet supported on the floor x 10 min.                           PT Education - 12/15/20 1320     Education Details Hip isometrics on land for HEP    Person(s) Educated Patient    Methods Explanation;Demonstration;Verbal cues;Handout    Comprehension Verbalized understanding;Returned demonstration              PT Short Term Goals - 11/18/20 0630  PT SHORT TERM GOAL #1   Title patient is independent with initial HEP    Period Weeks    Status Achieved      PT SHORT TERM GOAL #2   Title patient is able to use her dilator/wand correctly to reduce trigger points    Time 4    Period Weeks    Status Achieved      PT SHORT TERM GOAL #3   Title patient is able to perform diaphragmatic breathing to bulge the pelvic floor    Time 4    Period Weeks    Status Achieved      PT SHORT TERM GOAL #4   Title patient understands ways to calm the nervous system to reduce her pain    Time 4    Period Weeks    Status Achieved      PT SHORT TERM GOAL #5   Title patient reports her pain intensity is reduced 25% of the day    Time 4    Period Weeks    Status Achieved               PT Long Term Goals - 12/06/20 1243       PT LONG TERM GOAL #1   Title Patient is independent with advanced HEP    Time 4     Period Months    Status On-going      PT LONG TERM GOAL #2   Title pelvic pain decreased </= 3/10 50-60% of the time due to reduction of trigger points    Baseline patient reports it is decreased but did not give a percentage    Time 4    Period Months    Status On-going      PT LONG TERM GOAL #3   Title patient is able to fully empty her bowels due to improve pelvic floor coordination during a bowel movement    Baseline not able to fully empty, still feels bloated    Time 4    Period Months    Status On-going      PT LONG TERM GOAL #4   Title patient is able to fully empty her bladder and is able to wait 2-3 hours to urinate due to relaxation of the pelvic floor    Baseline empty the bladder, still has pressure    Time 4    Period Months    Status Achieved                   Plan - 12/15/20 1321     Clinical Impression Statement Pt reports tolerating her first aquatic treatment well, some RT hip pain/soreness about 1 day. Pt was educated in hip isometrics to perform on land as part of her HEP. Pt did express some increased hip pain to 3-4/10 level during the session. This reduces with change of activity and rest. Pt tolerated decompression float better at end of session if feet are supported on the bottom of the pool vs floating.    Personal Factors and Comorbidities Time since onset of injury/illness/exacerbation;Finances;Sex;Profession;Past/Current Experience    Examination-Activity Limitations Locomotion Level;Toileting;Stand    Examination-Participation Restrictions Occupation;Community Activity;Interpersonal Relationship    Stability/Clinical Decision Making Stable/Uncomplicated    Rehab Potential Excellent    PT Frequency 1x / week    PT Duration Other (comment)    PT Treatment/Interventions ADLs/Self Care Home Management;Biofeedback;Cryotherapy;Electrical Stimulation;Iontophoresis 4mg /ml Dexamethasone;Moist Heat;Ultrasound;Neuromuscular re-education;Therapeutic  exercise;Therapeutic activities;Patient/family education;Manual techniques;Dry needling;Taping;Spinal Manipulations;Joint Manipulations;Aquatic Therapy  PT Next Visit Plan Review isometrics, increase walking    PT Home Exercise Plan Access Code: [email or web address]    Consulted and Agree with Plan of Care Patient             Patient will benefit from skilled therapeutic intervention in order to improve the following deficits and impairments:  Decreased coordination, Increased fascial restricitons, Decreased endurance, Decreased activity tolerance, Dizziness, Pain, Decreased strength  Visit Diagnosis: Muscle weakness (generalized)  Myofascial muscle pain  Cramp and spasm  Other lack of coordination     Problem List Patient Active Problem List   Diagnosis Date Noted   Headache 11/30/2020   Positive ANA (antinuclear antibody) 11/15/2020   Fibromyalgia 11/02/2020   Hair loss 11/02/2020   Muscle spasticity 10/22/2020   Rash and other nonspecific skin eruption 10/22/2020   Paresthesia of bilateral legs 10/06/2020   Myalgia 10/06/2020   Chronic pelvic pain in female 10/06/2020   Chronic abdominal pain 10/06/2020   IBS (irritable bowel syndrome) 08/02/2020   Pelvic floor dysfunction 08/02/2020   Irritant dermatitis 08/02/2020   Gastroesophageal reflux disease 06/28/2020   Severe recurrent major depression without psychotic features (HCC) 06/28/2020   Polyarthralgia 06/28/2020   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 06/28/2020   Tachycardia 06/28/2020   History of COVID-19 06/28/2020   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 deficit 06/28/2020   Recurrent major depressive disorder, in full remission (HCC) 06/28/2020   Atypical chest pain 06/22/2020   Dizziness 06/22/2020   SOB (shortness of breath) 06/22/2020   Palpitations 06/14/2020   Anxiety 05/04/2020   Internal and external hemorrhoids without complication 04/29/2020   Acute recurrent  pansinusitis 10/21/2019   ETD (Eustachian tube dysfunction), bilateral 10/21/2019   Cat allergy due to both airborne and skin contact 05/01/2016   Chronic sore throat 05/01/2016   Acne vulgaris 03/11/2014   Mild intermittent asthma without complication 03/11/2014   Seasonal allergic rhinitis 03/11/2014   Seasonal allergies 03/11/2014    Kathye Cipriani, PTA 12/15/2020, 1:25 PM  [facility] [address] [place], [place], [address] Phone: [phone]   Fax:  [phone]  Name: Brianna [name] MRN: [id number] Date of Birth: November 17, 1995

## 2020-12-15 NOTE — Telephone Encounter (Signed)
Call from pt who has questions for Dr Monna Fam about their last visit.     Referral(s):  [facility] Please call Thanks

## 2020-12-16 ENCOUNTER — Telehealth: Payer: Self-pay | Admitting: Behavioral Health

## 2020-12-16 ENCOUNTER — Telehealth: Payer: Self-pay | Admitting: Pharmacist

## 2020-12-16 DIAGNOSIS — F411 Generalized anxiety disorder: Secondary | ICD-10-CM | POA: Diagnosis not present

## 2020-12-16 NOTE — Telephone Encounter (Signed)
Per Dr. Monna Fam patient wanted to review medications.  Performed medication reconciliation and discussed all medications are appropriate.   Patient had no further questions at this time. Let patient know if she has any follow-up questions she may call and speak with me.

## 2020-12-16 NOTE — Telephone Encounter (Signed)
TY

## 2020-12-16 NOTE — Telephone Encounter (Signed)
RC to Pt today after attempting to reach her yesterday. Pt answered the phone, but is in another appt. Agreed to speak @ Noon today when Pt appt is completed.   Dr. Monna Fam

## 2020-12-20 ENCOUNTER — Ambulatory Visit: Payer: BC Managed Care – PPO | Admitting: Neurology

## 2020-12-22 ENCOUNTER — Other Ambulatory Visit: Payer: Self-pay

## 2020-12-22 ENCOUNTER — Encounter: Payer: BC Managed Care – PPO | Admitting: Physical Therapy

## 2020-12-22 ENCOUNTER — Encounter: Payer: Self-pay | Admitting: Physical Therapy

## 2020-12-22 ENCOUNTER — Ambulatory Visit
Payer: BC Managed Care – PPO | Attending: Student in an Organized Health Care Education/Training Program | Admitting: Physical Therapy

## 2020-12-22 DIAGNOSIS — R278 Other lack of coordination: Secondary | ICD-10-CM | POA: Diagnosis not present

## 2020-12-22 DIAGNOSIS — M7918 Myalgia, other site: Secondary | ICD-10-CM | POA: Insufficient documentation

## 2020-12-22 DIAGNOSIS — R41841 Cognitive communication deficit: Secondary | ICD-10-CM | POA: Insufficient documentation

## 2020-12-22 DIAGNOSIS — R252 Cramp and spasm: Secondary | ICD-10-CM

## 2020-12-22 DIAGNOSIS — M6281 Muscle weakness (generalized): Secondary | ICD-10-CM | POA: Insufficient documentation

## 2020-12-22 NOTE — Therapy (Signed)
[facility] [address], [address] [place], [place], [address] Phone: [phone]   Fax:  [phone]  Physical Therapy Treatment  Patient Details  Name: Brianna [name] MRN: [id number] Date of Birth: 1995/11/03 Referring Provider (PT): Dr. Oswaldo Done   Encounter Date: 12/22/2020   PT End of Session - 12/22/20 0937     Visit Number 11    Date for PT Re-Evaluation 02/07/21    Authorization Type BCBS    PT Start Time 0935    PT Stop Time 1015    PT Time Calculation (min) 40 min    Activity Tolerance Patient tolerated treatment well    Behavior During Therapy [facility] for tasks assessed/performed             Past Medical History:  Diagnosis Date   Asthma    Bronchitis    Depression    Fibromyalgia    Gastric paresis    Keratosis pilaris     Past Surgical History:  Procedure Laterality Date   ESOPHAGOGASTRODUODENOSCOPY ENDOSCOPY     03/2020   SMART PILL PROCEDURE     12/2020   TYMPANOSTOMY TUBE PLACEMENT Bilateral    WISDOM TOOTH EXTRACTION      There were no vitals filed for this visit.   Subjective Assessment - 12/22/20 0938     Subjective I have intermittent "twinges" in my arms and legs. Doing pool exercises at her mother's pool.    Pertinent History gastroparesis I dropped 50#;  now holding 105# but considered 25# underweight (working with nutritionist)    Limitations Walking;Standing;Lifting    How long can you walk comfortably? < 1/2 mile;  walk a lot at work    Patient Stated Goals reduce pelvic floor pain and increase strength. 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 Yes   See subjective   Aggravating Factors  Exercising too much, fairly constant    Pain Relieving Factors Heat feels good              Treatment: Patient seen for  aquatic therapy today.  Treatment took place in water 2.5-4 feet deep depending upon activity.  Pt entered the pool via stairs. Water temp 92 degrees F.  Seated water bench with 75% submersion Pt performed seated LE AROM exercises 20x in all planes, pain assessment concurrent.  Standing in upper waist depth for the following: Water walking forward and backwards 6 lengths. Tandem stance Bil 10 sec 2x, single leg stance Bil 10 sec 2x VC to contract gluteals, abdominal compression with small blue noodle 3 sec hold 10x, yellow UE water weights for shoulder horizontal add/abd 1 min  2 sets, isometric hip abd 5 sec hold 10x Bil, underwaater bicycle with yellow noodle behind pt: 30 sec on then rest 30 sec 3 bouts. 3 min to decompression hang which pt tolerated well.                            PT Short Term Goals - 11/18/20 6629       PT SHORT TERM GOAL #1   Title patient is independent with initial HEP    Period Weeks    Status Achieved      PT SHORT TERM GOAL #2  Title patient is able to use her dilator/wand correctly to reduce trigger points    Time 4    Period Weeks    Status Achieved      PT SHORT TERM GOAL #3   Title patient is able to perform diaphragmatic breathing to bulge the pelvic floor    Time 4    Period Weeks    Status Achieved      PT SHORT TERM GOAL #4   Title patient understands ways to calm the nervous system to reduce her pain    Time 4    Period Weeks    Status Achieved      PT SHORT TERM GOAL #5   Title patient reports her pain intensity is reduced 25% of the day    Time 4    Period Weeks    Status Achieved               PT Long Term Goals - 12/06/20 1243       PT LONG TERM GOAL #1   Title Patient is independent with advanced HEP    Time 4    Period Months    Status On-going      PT LONG TERM GOAL #2   Title pelvic pain decreased </= 3/10 50-60% of the time due to reduction of trigger points    Baseline patient reports  it is decreased but did not give a percentage    Time 4    Period Months    Status On-going      PT LONG TERM GOAL #3   Title patient is able to fully empty her bowels due to improve pelvic floor coordination during a bowel movement    Baseline not able to fully empty, still feels bloated    Time 4    Period Months    Status On-going      PT LONG TERM GOAL #4   Title patient is able to fully empty her bladder and is able to wait 2-3 hours to urinate due to relaxation of the pelvic floor    Baseline empty the bladder, still has pressure    Time 4    Period Months    Status Achieved                   Plan - 12/22/20 1818     Clinical Impression Statement Pt reports RT hip soreness after last aquatic session, again abolishing after about 1 day. Pt is compliant with HEP for land and aquatics. Pt seemed to tolerate todays session very well, increasing the volume of work gradually.    Personal Factors and Comorbidities Time since onset of injury/illness/exacerbation;Finances;Sex;Profession;Past/Current Experience    Examination-Activity Limitations Locomotion Level;Toileting;Stand    Examination-Participation Restrictions Occupation;Community Activity;Interpersonal Relationship    Stability/Clinical Decision Making Stable/Uncomplicated    Rehab Potential Excellent    PT Frequency 1x / week    PT Treatment/Interventions ADLs/Self Care Home Management;Biofeedback;Cryotherapy;Electrical Stimulation;Iontophoresis 4mg /ml Dexamethasone;Moist Heat;Ultrasound;Neuromuscular re-education;Therapeutic exercise;Therapeutic activities;Patient/family education;Manual techniques;Dry needling;Taping;Spinal Manipulations;Joint Manipulations;Aquatic Therapy    PT Next Visit Plan Continue with current aquatics program, consider bicyling for longer periods ( 45 sec to 1 min)    PT Home Exercise Plan Access Code: [email or web address]    Consulted and Agree with Plan of Care Patient             Patient will  benefit from skilled therapeutic intervention in order to improve the following deficits and impairments:  Decreased coordination, Increased fascial restricitons, Decreased  endurance, Decreased activity tolerance, Dizziness, Pain, Decreased strength  Visit Diagnosis: Muscle weakness (generalized)  Myofascial muscle pain  Cramp and spasm  Other lack of coordination     Problem List Patient Active Problem List   Diagnosis Date Noted   Headache 11/30/2020   Positive ANA (antinuclear antibody) 11/15/2020   Fibromyalgia 11/02/2020   Hair loss 11/02/2020   Muscle spasticity 10/22/2020   Rash and other nonspecific skin eruption 10/22/2020   Paresthesia of bilateral legs 10/06/2020   Myalgia 10/06/2020   Chronic pelvic pain in female 10/06/2020   Chronic abdominal pain 10/06/2020   IBS (irritable bowel syndrome) 08/02/2020   Pelvic floor dysfunction 08/02/2020   Irritant dermatitis 08/02/2020   Gastroesophageal reflux disease 06/28/2020   Severe recurrent major depression without psychotic features (HCC) 06/28/2020   Polyarthralgia 06/28/2020   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 06/28/2020   Tachycardia 06/28/2020   History of COVID-19 06/28/2020   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 deficit 06/28/2020   Recurrent major depressive disorder, in full remission (HCC) 06/28/2020   Atypical chest pain 06/22/2020   Dizziness 06/22/2020   SOB (shortness of breath) 06/22/2020   Palpitations 06/14/2020   Anxiety 05/04/2020   Internal and external hemorrhoids without complication 04/29/2020   Acute recurrent pansinusitis 10/21/2019   ETD (Eustachian tube dysfunction), bilateral 10/21/2019   Cat allergy due to both airborne and skin contact 05/01/2016   Chronic sore throat 05/01/2016   Acne vulgaris 03/11/2014   Mild intermittent asthma without complication 03/11/2014   Seasonal allergic rhinitis 03/11/2014   Seasonal allergies 03/11/2014     Martin Smeal, PTA 12/22/2020, 6:21 PM  [facility] [address] [place], [place], [address] Phone: [phone]   Fax:  [phone]  Name: Brianna [name] MRN: [id number] Date of Birth: 1996/03/09

## 2020-12-23 DIAGNOSIS — F411 Generalized anxiety disorder: Secondary | ICD-10-CM | POA: Diagnosis not present

## 2020-12-27 ENCOUNTER — Ambulatory Visit: Payer: BC Managed Care – PPO | Admitting: Physical Therapy

## 2020-12-28 ENCOUNTER — Other Ambulatory Visit: Payer: Self-pay

## 2020-12-28 ENCOUNTER — Ambulatory Visit: Payer: BC Managed Care – PPO | Admitting: Behavioral Health

## 2020-12-28 ENCOUNTER — Other Ambulatory Visit: Payer: Self-pay | Admitting: Neurology

## 2020-12-28 ENCOUNTER — Ambulatory Visit (INDEPENDENT_AMBULATORY_CARE_PROVIDER_SITE_OTHER): Payer: BC Managed Care – PPO | Admitting: Family Medicine

## 2020-12-28 ENCOUNTER — Ambulatory Visit: Payer: BC Managed Care – PPO | Admitting: Physical Medicine and Rehabilitation

## 2020-12-28 ENCOUNTER — Other Ambulatory Visit: Payer: Self-pay | Admitting: Internal Medicine

## 2020-12-28 DIAGNOSIS — J302 Other seasonal allergic rhinitis: Secondary | ICD-10-CM

## 2020-12-28 DIAGNOSIS — F331 Major depressive disorder, recurrent, moderate: Secondary | ICD-10-CM

## 2020-12-28 DIAGNOSIS — F419 Anxiety disorder, unspecified: Secondary | ICD-10-CM

## 2020-12-28 DIAGNOSIS — K582 Mixed irritable bowel syndrome: Secondary | ICD-10-CM | POA: Diagnosis not present
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
## 2020-12-28 NOTE — Patient Instructions (Signed)
Goals: Incorporate liquid calories at least once daily, e.g., smoothies, almond milk, or fruit juices.   Eat at least 6 times daily, with your first meal within the first hour of being up.  Document the times you eat through the day.   Increase sources of high-quality fat to maximize energy intake: olive oil, canola oil, avocado, nut butters, mayonnaise (e.g., chicken or tuna salad), fatty fish.  Incorporate some nut butter or small amt of canola oil to smoothies.  Use nut butters more often for snacks.   During times of less stress, include small amounts of foods that might be poorly tolerated, and document your response.  As you incorporate some foods that haven't been well tolerated, do this as systematically as you can, increase slowly and progressively, and aim for consistency, e.g., 3 X wk or daily.   When you see Dr. Monna Fam today, in addition to asking about a psychiatrist, ask about strategies or tools for managing anxiety both acutely and preemptively - with some type of daily routine.    Follow-up video visit on Tuesday, Sept 6 at 11 AM.

## 2020-12-29 ENCOUNTER — Telehealth: Payer: Self-pay | Admitting: Behavioral Health

## 2020-12-29 ENCOUNTER — Ambulatory Visit: Payer: BC Managed Care – PPO | Admitting: Physical Therapy

## 2020-12-29 ENCOUNTER — Encounter: Payer: Self-pay | Admitting: Internal Medicine

## 2020-12-29 DIAGNOSIS — K5901 Slow transit constipation: Secondary | ICD-10-CM | POA: Diagnosis not present

## 2020-12-29 DIAGNOSIS — F411 Generalized anxiety disorder: Secondary | ICD-10-CM | POA: Diagnosis not present

## 2020-12-29 DIAGNOSIS — K3184 Gastroparesis: Secondary | ICD-10-CM | POA: Diagnosis not present

## 2020-12-29 DIAGNOSIS — R109 Unspecified abdominal pain: Secondary | ICD-10-CM | POA: Diagnosis not present

## 2020-12-29 NOTE — BH Specialist Note (Signed)
Integrated Behavioral Health via Telemedicine Visit  12/29/2020 Brianna [name] [id number]  Number of Integrated Behavioral Health visits: 8 Session Start time: 2:00pm  Session End time: 2:50pm Total time: 50   Referring Provider: Dr. Merrilyn Puma, MD Patient/Family location: Pt is @ Mother's home in the pool, "hangin' out" [facility] Provider location: Remote All persons participating in visit: Pt & Clinician Types of Service: Individual psychotherapy  I connected with Brianna [name] and/or Brianna [name]'s  self  via  Telephone or Engineer, civil (consulting)  (Video is Surveyor, mining) and verified that I am speaking with the correct person using two identifiers. Discussed confidentiality:  8th visit  I discussed the limitations of telemedicine and the availability of in person appointments.  Discussed there is a possibility of technology failure and discussed alternative modes of communication if that failure occurs.  I discussed that engaging in this telemedicine visit, they consent to the provision of behavioral healthcare and the services will be billed under their insurance.  Patient and/or legal guardian expressed understanding and consented to Telemedicine visit:  8th visit  Presenting Concerns: Patient and/or family reports the following symptoms/concerns: it has been, "tough on my body to even work PT" & "managing to work w/my Ex, even when he makes snide comments" Duration of problem: months; Severity of problem: moderate  Patient and/or Family's Strengths/Protective Factors: Social connections, Concrete supports in place (healthy food, safe environments, etc.), Sense of purpose, and Pt resilience to persevere, work, manage her career goals, & emot'ly regulate through, "waves of depressive moments"  Goals Addressed: Patient will:  Reduce symptoms of: anxiety, depression, mood instability, stress, and emot'l dysregulation    Increase  knowledge and/or ability of: coping skills, healthy habits, stress reduction, and emot'l regulation skills    Demonstrate ability to: Increase healthy adjustment to current life circumstances and Increase adequate support systems for patient/family  Progress towards Goals: Ongoing  Interventions: Interventions utilized:  Solution-Focused Strategies, Behavioral Activation, and Supportive Counseling Standardized Assessments completed:  screeners prn  Patient and/or Family Response: Pt receptive to call today & requests future appt until she has Psychiatrist/Cslr on board  Assessment: Patient currently experiencing elevated emot'l dysregulation due to past Hx of FOO concerns.   Patient may benefit from cont'd TIC, appropriate Psychiatric Referral for medications, & Psychotherapy from Trauma Specialist.  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 Gerilyn Pilgrim Referral(s): Psychiatrist and Counselor  I discussed the assessment and treatment plan with the patient and/or parent/guardian. They were provided an opportunity to ask questions and all were answered. They agreed with the plan and demonstrated an understanding of the instructions.   They were advised to call back or seek an in-person evaluation if the symptoms worsen or if the condition fails to improve as anticipated.  Brianna Lever, LMFT

## 2020-12-29 NOTE — Assessment & Plan Note (Signed)
Patient on Flonase for treatment of seasonal allergies.  Refilled Prescription today. Please follow-up with patient at next visit.

## 2020-12-29 NOTE — Telephone Encounter (Signed)
Contacted Pt w/3 add[facility] today per Pt requestMartin Majestic @ [phone] [facility] @ [phone], ext [phone] [facility] @ [phone], ext [phone]  Dr. Monna Fam

## 2020-12-31 ENCOUNTER — Other Ambulatory Visit: Payer: Self-pay

## 2020-12-31 ENCOUNTER — Ambulatory Visit (INDEPENDENT_AMBULATORY_CARE_PROVIDER_SITE_OTHER): Payer: BC Managed Care – PPO | Admitting: Sports Medicine

## 2020-12-31 ENCOUNTER — Ambulatory Visit
Admission: RE | Admit: 2020-12-31 | Discharge: 2020-12-31 | Disposition: A | Discharge status: Home / Self Care | Payer: BC Managed Care – PPO | Source: Ambulatory Visit | Attending: Sports Medicine | Admitting: Sports Medicine

## 2020-12-31 VITALS — BP 98/60 | Ht 63.0 in | Wt 104.4 lb

## 2020-12-31 DIAGNOSIS — M25551 Pain in right hip: Secondary | ICD-10-CM | POA: Diagnosis not present

## 2020-12-31 NOTE — Progress Notes (Addendum)
   Subjective:    [name], female    DOB: 1996/01/21, 25 y.o.   MRN: [id number]  HPI  Brianna [name] presents today for follow-up on right hip pain sooner than expected.  She was last seen in the office in July.  She has known hypermobility and has had chronic diffuse right hip pain most of her life.  Despite physical therapy, her pain has worsened.  It is diffuse throughout the hip.  It affects her activities of daily living as well as her ability to exercise.  He would like for [place] to investigate this further.   Review of Systems As above    Objective:   Physical Exam  Well-developed, well-nourished.  No acute distress  Right hip: Smooth painless hip range of motion.  Mildly positive FADIR.  Negative FABER.  Still with some weakness with hip abduction.  Neurovascularly intact distally.      Assessment & Plan:   Chronic right hip pain-rule out labral tear Hypermobility  Patient has had chronic right hip pain which has not improved with extensive physical therapy.  Dedicated imaging of the right hip has yet to be done.  I want to start with an x-ray of the right hip but will also pursue an MRI arthrogram specifically to rule out a labral tear phone follow-up with those results when available.  We will delineate further treatment based on those findings.  Addendum (01/03/2021): X-rays reviewed.  They are unremarkable.  Proceed with MRI arthrogram as scheduled.

## 2021-01-05 ENCOUNTER — Encounter: Payer: Self-pay | Admitting: Physical Therapy

## 2021-01-05 ENCOUNTER — Ambulatory Visit: Payer: BC Managed Care – PPO | Admitting: Physical Therapy

## 2021-01-05 ENCOUNTER — Other Ambulatory Visit: Payer: Self-pay

## 2021-01-05 DIAGNOSIS — M6281 Muscle weakness (generalized): Secondary | ICD-10-CM

## 2021-01-05 DIAGNOSIS — R41841 Cognitive communication deficit: Secondary | ICD-10-CM | POA: Diagnosis not present

## 2021-01-05 DIAGNOSIS — R278 Other lack of coordination: Secondary | ICD-10-CM | POA: Diagnosis not present

## 2021-01-05 DIAGNOSIS — M7918 Myalgia, other site: Secondary | ICD-10-CM

## 2021-01-05 DIAGNOSIS — R252 Cramp and spasm: Secondary | ICD-10-CM

## 2021-01-05 NOTE — Therapy (Signed)
[facility] [address], [address] [place], [place], [address] Phone: [phone]   Fax:  [phone]  Physical Therapy Treatment  Patient Details  Name: Brianna [name] MRN: [id number] Date of Birth: 11/21/1995 Referring Provider (PT): Dr. Miguel Aschoff   Encounter Date: 01/05/2021   PT End of Session - 01/05/21 1535     Visit Number 12    Date for PT Re-Evaluation 02/07/21    Authorization Type BCBS    PT Start Time 1015    PT Stop Time 1053    PT Time Calculation (min) 38 min    Activity Tolerance Patient limited by pain;Patient tolerated treatment well    Behavior During Therapy [facility] for tasks assessed/performed             Past Medical History:  Diagnosis Date   Acute recurrent pansinusitis 10/21/2019   Asthma    Bronchitis    Cat allergy due to both airborne and skin contact 05/01/2016   Chronic sore throat 05/01/2016   Depression    Fibromyalgia    Gastric paresis    Keratosis pilaris    Seasonal allergic rhinitis 03/11/2014    Past Surgical History:  Procedure Laterality Date   ESOPHAGOGASTRODUODENOSCOPY ENDOSCOPY     03/2020   SMART PILL PROCEDURE     12/2020   TYMPANOSTOMY TUBE PLACEMENT Bilateral    WISDOM TOOTH EXTRACTION      There were no vitals filed for this visit.   Treatment; Patient seen for aquatic therapy today.  Treatment took place in water 2.5-4 feet deep depending upon activity.  Pt entered the pool via stairs. Water temp 92 degrees F.  Seated water bench with 75% submersion Pt performed seated LE AROM exercises 20x in all planes, pain assessment concurrent. Lengthy discussion on what exercises to do in the pool.Pt frustrated because she verbally expresses increased Rt hip pain when exercising, even in the water, constant popping, and additionally it feels tight. PTA explained in general she can do all AROM exercises in the pool, avoiding HER full ROM and finding some  middle ground of motion that isn't too painful nor reproduces/increases her pain. Pt finds isometrics in the pool difficult to do. PTA suggested maybe concentrating on isometrics on land?  Standing in mid thoracic depth:  Tandem stance 2x 10 sec each side Forward walking with heavy use of yellow noodle for forward walking 4 lengths.  Multiple bouts of decompression hanging on the noodle while listening to the pt and discussing things from her frustrations, pain, exercises, etc. Pain did increase in the pool from 2-4/10.                              PT Short Term Goals - 01/05/21 1101       PT SHORT TERM GOAL #1   Title patient is independent with initial HEP    Time 4    Period Weeks    Status Achieved      PT SHORT TERM GOAL #2   Title patient is able to use her dilator/wand correctly to reduce trigger points    Time 4    Period Weeks    Status Achieved      PT SHORT TERM GOAL #3   Title patient is able to perform diaphragmatic breathing to bulge the pelvic floor    Time 4    Period Weeks    Status Achieved  PT SHORT TERM GOAL #4   Title patient understands ways to calm the nervous system to reduce her pain    Time 4    Period Weeks    Status Achieved      PT SHORT TERM GOAL #5   Title patient reports her pain intensity is reduced 25% of the day    Baseline for pelvic floor    Time 4    Period Weeks    Status Achieved               PT Long Term Goals - 01/05/21 1101       PT LONG TERM GOAL #1   Title Patient is independent with advanced HEP    Baseline for pelvic floor    Time 4    Period Months    Status Achieved      PT LONG TERM GOAL #2   Title pelvic pain decreased </= 3/10 50-60% of the time due to reduction of trigger points    Baseline for pelvic floor    Time 4    Period Months    Status Achieved      PT LONG TERM GOAL #3   Title patient is able to fully empty her bowels due to improve pelvic floor coordination  during a bowel movement    Time 4    Period Months    Status Achieved      PT LONG TERM GOAL #4   Title patient is able to fully empty her bladder and is able to wait 2-3 hours to urinate due to relaxation of the pelvic floor    Time 4    Period Months    Status Achieved                   Plan - 01/05/21 1536     Clinical Impression Statement Increased RT pain 2-4/10 in the pool today.    Personal Factors and Comorbidities Time since onset of injury/illness/exacerbation;Finances;Sex;Profession;Past/Current Experience    Examination-Activity Limitations Locomotion Level;Toileting;Stand    Examination-Participation Restrictions Occupation;Community Activity;Interpersonal Relationship    Stability/Clinical Decision Making Stable/Uncomplicated    Rehab Potential Excellent    PT Duration Other (comment)    PT Treatment/Interventions ADLs/Self Care Home Management;Biofeedback;Cryotherapy;Electrical Stimulation;Iontophoresis 4mg /ml Dexamethasone;Moist Heat;Ultrasound;Neuromuscular re-education;Therapeutic exercise;Therapeutic activities;Patient/family education;Manual techniques;Dry needling;Taping;Spinal Manipulations;Joint Manipulations;Aquatic Therapy    PT Next Visit Plan discharge to HEP; will continue with aquatic program for her Fibromyalgia    PT Home Exercise Plan Access Code: [email or web address]    Consulted and Agree with Plan of Care Patient             Patient will benefit from skilled therapeutic intervention in order to improve the following deficits and impairments:  Decreased coordination, Increased fascial restricitons, Decreased endurance, Decreased activity tolerance, Dizziness, Pain, Decreased strength  Visit Diagnosis: Muscle weakness (generalized)  Other lack of coordination  Myofascial muscle pain     Problem List Patient Active Problem List   Diagnosis Date Noted   Headache 11/30/2020   Positive ANA (antinuclear antibody) 11/15/2020   Fibromyalgia  11/02/2020   Hair loss 11/02/2020   Muscle spasticity 10/22/2020   Rash and other nonspecific skin eruption 10/22/2020   Paresthesia of bilateral legs 10/06/2020   Myalgia 10/06/2020   Chronic pelvic pain in female 10/06/2020   Chronic abdominal pain 10/06/2020   IBS (irritable bowel syndrome) 08/02/2020   Pelvic floor dysfunction 08/02/2020   Irritant dermatitis 08/02/2020   Gastroesophageal reflux disease 06/28/2020   Severe recurrent  major depression without psychotic features (HCC) 06/28/2020   Polyarthralgia 06/28/2020   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 06/28/2020   Tachycardia 06/28/2020   History of COVID-19 06/28/2020   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 deficit 06/28/2020   Recurrent major depressive disorder, in full remission (HCC) 06/28/2020   Atypical chest pain 06/22/2020   Dizziness 06/22/2020   SOB (shortness of breath) 06/22/2020   Palpitations 06/14/2020   Anxiety 05/04/2020   Internal and external hemorrhoids without complication 04/29/2020   ETD (Eustachian tube dysfunction), bilateral 10/21/2019   Acne vulgaris 03/11/2014   Mild intermittent asthma without complication 03/11/2014   Seasonal allergies 03/11/2014    Paighton Godette, PTA 01/05/2021, 3:37 PM  [facility] [address] [place], [place], [address] Phone: [phone]   Fax:  [phone]  Name: Brianna [name] MRN: [id number] Date of Birth: 1996-01-13

## 2021-01-05 NOTE — Patient Instructions (Signed)
Access Code: [email or web address] URL: [email or web address] Date: 01/05/2021 Prepared by: Eulis Foster  Exercises Supine Hamstring Stretch with Strap - 1 x daily - 7 x weekly - 1 sets - 2 reps - 30 sec hold Supine ITB Stretch with Strap - 1 x daily - 7 x weekly - 1 sets - 2 reps - 30 sec hold Supine Pelvic Floor Stretch - 1 x daily - 7 x weekly - 1 sets - 1 reps - 1 in hold Standing Hip Hinge - 1 x daily - 3 x weekly - 1 sets - 10 reps Prone Hip Abduction on Slider - 1 x daily - 3 x weekly - 1 sets - 10 reps Supine Shoulder Flexion with Dowel - 1 x daily - 7 x weekly - 1 sets - 10 reps Scapula Isolations - 1 x daily - 7 x weekly - 3 sets - 10 reps Supine Pelvic Floor Contraction - 1 x daily - 7 x weekly - 1 sets - 10 reps - 5 sec hold Hooklying Isometric Hip Flexion - 1 x daily - 5 x weekly - 10 reps - 5 hold Hooklying Isometric Hip External Rotation - 1 x daily - 5 x weekly - 10 reps - 5 hold Standing Isometric Hip Abduction with Knee at 90 at Wall - 1 x daily - 5 x weekly - 10 reps - 5 hold Long Sitting Hamstring Set - External Rotation - 1 x daily - 5 x weekly - 10 reps - 5 hold Standing Isometric Hip Flexion with Ball at Wall - 1 x daily - 5 x weekly - 10 reps - 5 hold [facility] [address], [address] [place], [place] [address] Phone # [phone] Fax [phone]

## 2021-01-05 NOTE — Therapy (Signed)
[facility] [address] [place], [place], [address] Phone: [phone]   Fax:  [phone]  Physical Therapy Treatment  Patient Details  Name: Brianna [name] MRN: [id number] Date of Birth: 19-Nov-1995 Referring Provider (PT): Dr. Angelica Pou   Encounter Date: 01/05/2021   PT End of Session - 01/05/21 1100     Visit Number 12    Authorization Type BCBS    PT Start Time 5697    PT Stop Time 9480    PT Time Calculation (min) 38 min    Activity Tolerance Patient tolerated treatment well    Behavior During Therapy [facility] for tasks assessed/performed             Past Medical History:  Diagnosis Date   Acute recurrent pansinusitis 10/21/2019   Asthma    Bronchitis    Cat allergy due to both airborne and skin contact 05/01/2016   Chronic sore throat 05/01/2016   Depression    Fibromyalgia    Gastric paresis    Keratosis pilaris    Seasonal allergic rhinitis 03/11/2014    Past Surgical History:  Procedure Laterality Date   ESOPHAGOGASTRODUODENOSCOPY ENDOSCOPY     03/2020   SMART PILL PROCEDURE     12/2020   TYMPANOSTOMY TUBE PLACEMENT Bilateral    WISDOM TOOTH EXTRACTION      There were no vitals filed for this visit.   Subjective Assessment - 01/05/21 1020     Subjective I was at the pool today with Jenn. My xray is normal. My hip pain is flaring up all the time. I have bone pain. MD is concerned about a labral and will have a MRI on 8/31. When in the pool my hip aches.    Pertinent History gastroparesis I dropped 50#;  now holding 105# but considered 25# underweight (working with nutritionist)    Limitations Walking;Standing;Lifting    How long can you walk comfortably? < 1/2 mile;  walk a lot at work    Patient Stated Goals reduce pelvic floor pain and increase strength. learn techniques that can help;  I would be doing outdoor activities (hiking, climbing);  I do yoga but stopped recently;    I would like to work in the medical field (Lewis like to be a PA)                [facility] PT Assessment - 01/05/21 0001       Assessment   Medical Diagnosis M62.89 Pelvic floor dysfunction; N80.9 Endometriosis    Referring Provider (PT) Dr. Angelica Pou    Prior Therapy yes, Pelvic      Precautions   Precautions None      Restrictions   Weight Bearing Restrictions No      Prior Function   Level of Independence Independent    Vocation Full time employment    Vocation Requirements standing, lifting, hospitality    Leisure yoga daily      Cognition   Overall Cognitive Status Within Functional Limits for tasks assessed      Posture/Postural Control   Posture/Postural Control Postural limitations    Postural Limitations Rounded Shoulders;Posterior pelvic tilt;Increased thoracic kyphosis      PROM   Overall PROM Comments full hip ROM      Strength   Right Hip ABduction 4/5    Right Hip ADduction 5/5    Left Hip Flexion 4+/5    Left Hip ABduction 4/5    Left Hip ADduction  5/5      Special Tests    Special Tests Sacrolliac Tests      Gaenslen's test   Findings Negative    Side  Left    Comments decreased movement                        Pelvic Floor Special Questions - 01/05/21 0001     Urinary Leakage No    Urinary urgency No    Urinary frequency full empty bladder    Fecal incontinence No    Falling out feeling (prolapse) No    Pelvic Floor Internal Exam Patient confirms identification and approves PT to assess pelvic floor and treatment    Exam Type Vaginal    Palpation no tenderness, muscle feel pliable    Strength fair squeeze, definite lift               [facility] Adult PT Treatment/Exercise - 01/05/21 0001       Lumbar Exercises: Stretches   Active Hamstring Stretch Right;Left;1 rep;30 seconds    Active Hamstring Stretch Limitations supine with strap    ITB Stretch Right;Left;1 rep;30 seconds    ITB Stretch Limitations supine  with strap      Lumbar Exercises: Supine   Other Supine Lumbar Exercises hookly with pelvic floor contraction holding for 5 sec 10x followed by 5 quicks; hookly with ball squeeze and holding yellow band at shoulder height moving the arms side to side and not move the pelvis; hookly holding the yellow band and moving both arms overhead and back.      Lumbar Exercises: Quadruped   Other Quadruped Lumbar Exercises hip hinge 20x      Knee/Hip Exercises: Prone   Other Prone Exercises prone hip abduction 15x each                    PT Education - 01/05/21 1051     Education Details Access Code: [email or web address]    Person(s) Educated Patient    Methods Explanation;Demonstration;Verbal cues    Comprehension Returned demonstration;Verbalized understanding              PT Short Term Goals - 01/05/21 1101       PT SHORT TERM GOAL #1   Title patient is independent with initial HEP    Time 4    Period Weeks    Status Achieved      PT SHORT TERM GOAL #2   Title patient is able to use her dilator/wand correctly to reduce trigger points    Time 4    Period Weeks    Status Achieved      PT SHORT TERM GOAL #3   Title patient is able to perform diaphragmatic breathing to bulge the pelvic floor    Time 4    Period Weeks    Status Achieved      PT SHORT TERM GOAL #4   Title patient understands ways to calm the nervous system to reduce her pain    Time 4    Period Weeks    Status Achieved      PT SHORT TERM GOAL #5   Title patient reports her pain intensity is reduced 25% of the day    Baseline for pelvic floor    Time 4    Period Weeks    Status Achieved               PT Long Term Goals -  01/05/21 1101       PT LONG TERM GOAL #1   Title Patient is independent with advanced HEP    Baseline for pelvic floor    Time 4    Period Months    Status Achieved      PT LONG TERM GOAL #2   Title pelvic pain decreased </= 3/10 50-60% of the time due to reduction of  trigger points    Baseline for pelvic floor    Time 4    Period Months    Status Achieved      PT LONG TERM GOAL #3   Title patient is able to fully empty her bowels due to improve pelvic floor coordination during a bowel movement    Time 4    Period Months    Status Achieved      PT LONG TERM GOAL #4   Title patient is able to fully empty her bladder and is able to wait 2-3 hours to urinate due to relaxation of the pelvic floor    Time 4    Period Months    Status Achieved                   Plan - 01/05/21 1103     Clinical Impression Statement Patient pelvic floor strength is 3/5. She is able to fully empty her bladder and bowels. Patient has not trigger points in the pelvic floor. Her hip strength is 4/5. Patient is currently attending therapy in the pool for her Fibromyalgia and orthopedic issues. She will be discharged from the pelvic floor due to achieving her pelvic floor goals. Patient is independent with her HEP.    Personal Factors and Comorbidities Time since onset of injury/illness/exacerbation;Finances;Sex;Profession;Past/Current Experience    Examination-Activity Limitations Locomotion Level;Toileting;Stand    Examination-Participation Restrictions Occupation;Community Activity;Interpersonal Relationship    Stability/Clinical Decision Making Stable/Uncomplicated    Rehab Potential Excellent    PT Treatment/Interventions ADLs/Self Care Home Management;Biofeedback;Cryotherapy;Electrical Stimulation;Iontophoresis 26m/ml Dexamethasone;Moist Heat;Ultrasound;Neuromuscular re-education;Therapeutic exercise;Therapeutic activities;Patient/family education;Manual techniques;Dry needling;Taping;Spinal Manipulations;Joint Manipulations;Aquatic Therapy    PT Next Visit Plan discharge to HEP; will continue with aquatic program for her Fibromyalgia    PT Home Exercise Plan Access Code: [email or web address]   Consulted and Agree with Plan of Care Patient             Patient will  benefit from skilled therapeutic intervention in order to improve the following deficits and impairments:  Decreased coordination, Increased fascial restricitons, Decreased endurance, Decreased activity tolerance, Dizziness, Pain, Decreased strength  Visit Diagnosis: Muscle weakness (generalized)  Cramp and spasm  Other lack of coordination     Problem List Patient Active Problem List   Diagnosis Date Noted   Headache 11/30/2020   Positive ANA (antinuclear antibody) 11/15/2020   Fibromyalgia 11/02/2020   Hair loss 11/02/2020   Muscle spasticity 10/22/2020   Rash and other nonspecific skin eruption 10/22/2020   Paresthesia of bilateral legs 10/06/2020   Myalgia 10/06/2020   Chronic pelvic pain in female 10/06/2020   Chronic abdominal pain 10/06/2020   IBS (irritable bowel syndrome) 08/02/2020   Pelvic floor dysfunction 08/02/2020   Irritant dermatitis 08/02/2020   Gastroesophageal reflux disease 06/28/2020   [place] 06/28/2020   Polyarthralgia 06/28/2020   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 06/28/2020   Tachycardia 06/28/2020   History of COVID-19 06/28/2020   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 [place], [place], [address] Phone: [phone]   Fax:  [phone]  Name:  Brianna [name] MRN: [id number] Date of Birth: 12/14/1995  PHYSICAL THERAPY DISCHARGE SUMMARY  Visits from Start of Care: 9  Current functional level related to goals / functional outcomes: See above.    Remaining deficits: See above   Education / Equipment: HEP   Patient agrees to discharge. Patient goals were met. Patient is being discharged due to meeting the stated rehab goals. Thank you for the referral. Earlie Counts, PT 01/05/21 11:09 AM

## 2021-01-07 DIAGNOSIS — F411 Generalized anxiety disorder: Secondary | ICD-10-CM | POA: Diagnosis not present

## 2021-01-10 ENCOUNTER — Ambulatory Visit (INDEPENDENT_AMBULATORY_CARE_PROVIDER_SITE_OTHER): Payer: BC Managed Care – PPO | Admitting: Obstetrics & Gynecology

## 2021-01-10 ENCOUNTER — Other Ambulatory Visit (HOSPITAL_COMMUNITY)
Admission: RE | Admit: 2021-01-10 | Discharge: 2021-01-10 | Disposition: A | Discharge status: Home / Self Care | Payer: BC Managed Care – PPO | Source: Ambulatory Visit | Attending: Obstetrics & Gynecology | Admitting: Obstetrics & Gynecology

## 2021-01-10 ENCOUNTER — Other Ambulatory Visit: Payer: Self-pay

## 2021-01-10 ENCOUNTER — Encounter: Payer: Self-pay | Admitting: Obstetrics & Gynecology

## 2021-01-10 VITALS — BP 102/67 | HR 88 | Ht 64.0 in | Wt 102.0 lb

## 2021-01-10 DIAGNOSIS — Z01419 Encounter for gynecological examination (general) (routine) without abnormal findings: Secondary | ICD-10-CM | POA: Insufficient documentation

## 2021-01-10 DIAGNOSIS — Z113 Encounter for screening for infections with a predominantly sexual mode of transmission: Secondary | ICD-10-CM | POA: Insufficient documentation

## 2021-01-10 DIAGNOSIS — R399 Unspecified symptoms and signs involving the genitourinary system: Secondary | ICD-10-CM

## 2021-01-10 LAB — POCT URINALYSIS DIPSTICK
Appearance: NORMAL
Bilirubin, UA: NEGATIVE
Glucose, UA: NEGATIVE
Ketones, UA: NEGATIVE
Leukocytes, UA: NEGATIVE
Nitrite, UA: NEGATIVE
Protein, UA: NEGATIVE
Spec Grav, UA: 1.01 (ref 1.010–1.025)
Urobilinogen, UA: 0.2 E.U./dL
pH, UA: 7 (ref 5.0–8.0)

## 2021-01-10 NOTE — Progress Notes (Signed)
Pt presents today for yearly exam. LMP: 12/29/20. BCM: None. Pt would like STD screening.

## 2021-01-10 NOTE — Progress Notes (Signed)
Subjective:     Brianna [name] is a 25 y.o. female here for a routine exam.  Current complaints:  hair loss, weight loss due to gastroparesis, connective tissue issues, frequent urination with small voids.  Pt also has menstrual irregularities (see dr. Jinny Sanders note)   Gynecologic History Patient's last menstrual period was 12/29/2020 (exact date). Contraception: none Last Pap: none in our system--does not report abnormal pap Last mammogram: n/a  Obstetric History OB History  No obstetric history on file.     The following portions of the patient's history were reviewed and updated as appropriate: allergies, current medications, past family history, past medical history, past social history, past surgical history, and problem list.  Review of Systems Pertinent items noted in HPI and remainder of comprehensive ROS otherwise negative.    Objective:     Vitals:   01/10/21 0931  BP: 102/67  Pulse: 88  Weight: 102 lb (46.3 kg)  Height: 5\' 4"  (1.626 m)   Vitals:  WNL General appearance: alert, cooperative and no distress  HEENT: Normocephalic, without obvious abnormality, atraumatic Eyes: negative Throat: lips, mucosa, and tongue normal; teeth and gums normal  Respiratory: Clear to auscultation bilaterally  CV: Regular rate and rhythm  Breasts:  Normal appearance, no masses or tenderness, no nipple retraction or dimpling  GI: Soft, non-tender; bowel sounds normal; no masses,  no organomegaly  GU: External Genitalia:  Tanner V, no lesion Urethra:  No prolapse   Vagina: Pink, normal rugae, no blood or discharge  Cervix: No CMT, no lesion  Uterus:  Normal size and contour, non tender  Adnexa: Normal, no masses, non tender  Musculoskeletal: No edema, redness or tenderness in the calves or thighs  Skin: No lesions or rash, hirsutism noted on breasts and infraumbilical  Lymphatic: Axillary adenopathy: none     Psychiatric: Normal mood and behavior    Assessment:     Underweight female exam with multiple medical problems; normal gyn exam today.     Plan:    Pap with refelx HPV STD testing upon pat request 48 hour voiding diary (hat given) Ua, Ucx, microscopy

## 2021-01-11 ENCOUNTER — Encounter: Payer: Self-pay | Admitting: Physical Medicine and Rehabilitation

## 2021-01-11 ENCOUNTER — Other Ambulatory Visit: Payer: Self-pay

## 2021-01-11 ENCOUNTER — Encounter
Payer: BC Managed Care – PPO | Attending: Physical Medicine and Rehabilitation | Admitting: Physical Medicine and Rehabilitation

## 2021-01-11 VITALS — BP 106/72 | HR 83 | Temp 98.1°F | Ht 64.0 in | Wt 104.4 lb

## 2021-01-11 DIAGNOSIS — K3184 Gastroparesis: Secondary | ICD-10-CM | POA: Diagnosis not present

## 2021-01-11 DIAGNOSIS — K30 Functional dyspepsia: Secondary | ICD-10-CM | POA: Insufficient documentation

## 2021-01-11 DIAGNOSIS — G43709 Chronic migraine without aura, not intractable, without status migrainosus: Secondary | ICD-10-CM

## 2021-01-11 DIAGNOSIS — K5901 Slow transit constipation: Secondary | ICD-10-CM | POA: Diagnosis not present

## 2021-01-11 LAB — URINALYSIS, ROUTINE W REFLEX MICROSCOPIC
Bilirubin Urine: NEGATIVE
Glucose, UA: NEGATIVE
Hgb urine dipstick: NEGATIVE
Ketones, ur: NEGATIVE
Leukocytes,Ua: NEGATIVE
Nitrite: NEGATIVE
Protein, ur: NEGATIVE
Specific Gravity, Urine: 1.011 (ref 1.001–1.035)
pH: 6.5 (ref 5.0–8.0)

## 2021-01-11 LAB — CERVICOVAGINAL ANCILLARY ONLY
Bacterial Vaginitis (gardnerella): NEGATIVE
Candida Glabrata: NEGATIVE
Candida Vaginitis: NEGATIVE
Chlamydia: NEGATIVE
Comment: NEGATIVE
Comment: NEGATIVE
Comment: NEGATIVE
Comment: NEGATIVE
Comment: NEGATIVE
Comment: NORMAL
Neisseria Gonorrhea: NEGATIVE
Trichomonas: NEGATIVE

## 2021-01-11 LAB — CYTOLOGY - PAP: Diagnosis: NEGATIVE

## 2021-01-11 MED ORDER — NORTRIPTYLINE HCL 50 MG PO CAPS: 50.0000 mg | ORAL_CAPSULE | Freq: Every day | ORAL | 3 refills | Status: DC

## 2021-01-11 MED ORDER — NAC 600 MG PO CAPS: 1.0000 | ORAL_CAPSULE | Freq: Two times a day (BID) | ORAL | 0 refills | Status: DC

## 2021-01-11 MED ORDER — MIDODRINE HCL 2.5 MG PO TABS: 2.5000 mg | ORAL_TABLET | Freq: Every day | ORAL | 3 refills | Status: DC

## 2021-01-11 NOTE — Patient Instructions (Signed)
Foods to alleviate migraine:  1) dark leafy greens 2) avocado 3) tuna 4) samon and mackerel 5) beans and legumes Supplements that can be helpful: feverfew, B12, and magnesium  Foods to avoid in migraine: 1) Excessive (or irregular timing) coffee 2) red wine 3) aged cheeses 4) chocolate 5) citrus fruits 6) aspartame and other artifical sweeteners 7) yeast 8) MSG (in processed foods) 9) processed and cured meats 10) nuts and certain seeds 11) chicken livers and other organ meats 12) dairy products like buttermilk, sour cream, and yogurt 13) dried fruits like dates, figs, and raisins 14) garlic 15) onions 16) potato chips 17) pickled foods like olives and sauerkraut 18) some fresh fruits like ripe banana, papaya, red plums, raspberries, kiwi, pineapple 19) tomato-based products  Recommend to keep a migraine diary: rate daily the severity of your headache (1-10) and what foods you eat that day to help determine patterns.   

## 2021-01-11 NOTE — Progress Notes (Addendum)
Subjective:    [name], female    DOB: Feb 02, 1996, 25 y.o.   MRN: [id number]  HPI  Brianna [name] is a 25 year old woman who presents for follow-up of her hypermobility- related pains.   Brianna [name] is a 25 year old woman who presents to establish care for chronic nerve pain.  1) Chronic nerve pain:  -Pain on average is 6/10 -Pain right now is 3/10 -She uses a heating pad. Alternates ice for short times -She has occasional flare-ups concentrated around her mid-section.  -She is currently on Cymbalta which can help with nerve pain.  -she was started on Gabapentin and felt double vision and tingling on lips.   2) Pelvic floor dysfunction: -She is starting with a new therapist soon.  -Had a pelvic MRI at [facility] and reviewed this with her and was negative.   3) GI symptoms -Struggled with these since she was 25 years old.   4) History of multiple suicidal ideations  5) Spasms: -present throughout her body -her chriopractor may start her on a muscle relaxer.   6) Depression:  -suffered night sweats from Cymbalta.   7) Insomnia:  -sleep has been very poor for a year.  -her job ends at 10:30.   8) Anxiety: -she takes clonazepam as needed.  -has been working long hours at night without breaks -received a note saying she can only do 4 days per week.  9) ADHD: has not found much improvement in focus with Ritalin.   10) Migraines- has been following with neurology.Has not found great relief with the Rizatriptan. The Nortriptyline seems to have been making her thoughts more negative.   11) Right hip pain getting worse despite aquatic therapy     Pain Inventory Average Pain 7 Pain Right Now 4 My pain is constant, sharp, dull, and tingling  In the last 24 hours, has pain interfered with the following? General activity 5 Relation with others 5 Enjoyment of life 8 What TIME of day is your pain at its worst? morning  and evening Sleep (in  general) Poor  Pain is worse with: walking, sitting, inactivity, and standing Pain improves with: heat/ice and pacing activities Relief from Meds: 0      Family History  Problem Relation Age of Onset   Diabetes Mother    Polycystic ovary syndrome Mother    GER disease Father    Heart disease Paternal Uncle    Social History   Socioeconomic History   Marital status: Single    Spouse name: Not on file   Number of children: Not on file   Years of education: Not on file   Highest education level: Not on file  Occupational History   Not on file  Tobacco Use   Smoking status: Former    Types: E-cigarettes, Cigarettes    Quit date: 2021    Years since quitting: 1.6   Smokeless tobacco: Never  Vaping Use   Vaping Use: Former   Substances: Financial trader  Substance and Sexual Activity   Alcohol use: Not Currently   Drug use: Yes    Types: Marijuana   Sexual activity: Yes    Birth control/protection: None  Other Topics Concern   Not on file  Social History Narrative   Right handed   Social Determinants of Health   Financial Resource Strain: Not on file  Food Insecurity: Not on file  Transportation Needs: Not on file  Physical Activity: Not on file  Stress:  Not on file  Social Connections: Not on file   Past Surgical History:  Procedure Laterality Date   ESOPHAGOGASTRODUODENOSCOPY ENDOSCOPY     03/2020   SMART PILL PROCEDURE     12/2020   TYMPANOSTOMY TUBE PLACEMENT Bilateral    WISDOM TOOTH EXTRACTION     Past Medical History:  Diagnosis Date   Acute recurrent pansinusitis 10/21/2019   Asthma    Bronchitis    Cat allergy due to both airborne and skin contact 05/01/2016   Chronic sore throat 05/01/2016   Depression    Fibromyalgia    Gastric paresis    Keratosis pilaris    Migraine    Seasonal allergic rhinitis 03/11/2014   BP 106/72   Pulse 83   Temp 98.1 F (36.7 C) (Oral)   Ht 5\' 4"  (1.626 m)   Wt 104 lb 6.4 oz (47.4 kg)   LMP  12/29/2020 (Exact Date)   SpO2 98%   BMI 17.92 kg/m   Opioid Risk Score:   Fall Risk Score:  `1  Depression screen PHQ 2/9  Depression screen [facility] 2/9 01/11/2021 11/02/2020 10/11/2020 09/28/2020 09/21/2020 07/29/2020 07/09/2020  Decreased Interest 1 2 1 1  0 2 3  Down, Depressed, Hopeless 1 2 1 1 1 2 3   PHQ - 2 Score 2 4 2 2 1 4 6   Altered sleeping - 3 - - 3 3 3   Tired, decreased energy - 3 - - 3 3 3   Change in appetite - 3 - - 3 3 3   Feeling bad or failure about yourself  - 3 - - 2 3 3   Trouble concentrating - 3 - - 3 3 3   Moving slowly or fidgety/restless - 3 - - 2 3 3   Suicidal thoughts - 1 - - 1 1 1   PHQ-9 Score - 23 - - 18 23 25   Difficult doing work/chores - Extremely dIfficult - - Extremely dIfficult Very difficult Extremely dIfficult    Review of Systems  Constitutional:  Negative for diaphoresis and unexpected weight change.  HENT: Negative.    Eyes: Negative.   Respiratory:  Negative for shortness of breath.   Cardiovascular:  Negative for leg swelling.  Gastrointestinal:  Negative for abdominal pain, constipation, diarrhea and nausea.  Endocrine: Negative.   Genitourinary:  Positive for pelvic pain.       Bladder control  Musculoskeletal:  Positive for back pain, gait problem and myalgias.       Pain on right side of head , pain in right and left hand ,   Skin: Negative.   Allergic/Immunologic: Negative.   Neurological:  Negative for dizziness and weakness.       Tingling  Hematological:  Does not bruise/bleed easily.  Psychiatric/Behavioral:  Negative for confusion, dysphoric mood and suicidal ideas. The patient is not nervous/anxious.   All other systems reviewed and are negative.     Objective:   Physical Exam Gen: no distress, normal appearing HEENT: oral mucosa pink and moist, NCAT Cardio: Reg rate Chest: normal effort, normal rate of breathing Abd: soft, non-distended Ext: no edema Psych: pleasant, normal affect, +depression and anxiety.  Skin:  intact Neuro: Alert and oriented x3 Musculoskeletal: Diffuse tender points throughout the body on both side. Trigger points throughout. Hypermobility    Assessment & Plan:  1) Chronic Pain Syndrome secondary to fibromyalgia -Discussed current symptoms of pain and history of pain.  -Discussed benefits of exercise in reducing pain. -Apply emu oil. -discussed benefits of minimizing medications.  -Lupus panel  ordered.  -RA and ANA reviewed and negative.  -Discussed trigger point injections -Discussed following foods that may reduce pain: 1) Ginger 2) Blueberries 3) Salmon 4) Pumpkin seeds 5) dark chocolate 6) turmeric 7) tart cherries 8) virgin olive oil 9) chilli peppers 10) mint  Link to further information on diet for chronic pain: [email or web address]  2) Pelvic floor dysfunction: -continue pelvic floor therapy.  -estrogen, testosterone,  level  3) Depression: -she has been going to therapy twice per week. -had negative response to Cymbalta (night sweats).   4) Functional dyspepsia: -refer to gastroenterology for gastric emptying study.  -no benefits from Buspar, stop Buspar.   5) contraception: -started a NuvaRing a couple of days ago.   6) pain in lower back and legs: -stop Gabapentin  7) insomnia: -prescribed amitriptyline 10mg , discussed side effects  8) Foods to alleviate migraine:  1) dark leafy greens 2) avocado 3) tuna 4) samon and mackerel 5) beans and legumes Supplements that can be helpful: feverfew, B12, and magnesium  Foods to avoid in migraine: 1) Excessive (or irregular timing) coffee 2) red wine 3) aged cheeses 4) chocolate 5) citrus fruits 6) aspartame and other artifical sweeteners 7) yeast 8) MSG (in processed foods) 9) processed and cured meats 10) nuts and certain seeds 11) chicken livers and other organ meats 12) dairy products like buttermilk, sour cream,  and yogurt 13) dried fruits like dates, figs, and raisins 14) garlic 15) onions 16) potato chips 17) pickled foods like olives and sauerkraut 18) some fresh fruits like ripe banana, papaya, red plums, raspberries, kiwi, pineapple 19) tomato-based products  Recommend to keep a migraine diary: rate daily the severity of your headache (1-10) and what foods you eat that day to help determine patterns.    9) Hypotension: start midodrine 2.5mg  daily  >40 minutes spent in discussion of patient's diet, therapy, migraines, her daily function, low blood pressure, functional dyspepsia.

## 2021-01-11 NOTE — Addendum Note (Signed)
Addended by: Horton Chin on: 01/11/2021 10:20 AM   Modules accepted: Level of Service

## 2021-01-11 NOTE — Addendum Note (Signed)
Addended by: Horton Chin on: 01/11/2021 10:04 AM   Modules accepted: Orders

## 2021-01-12 ENCOUNTER — Ambulatory Visit: Payer: BC Managed Care – PPO | Admitting: Physical Therapy

## 2021-01-12 ENCOUNTER — Encounter: Payer: Self-pay | Admitting: Physical Therapy

## 2021-01-12 DIAGNOSIS — R252 Cramp and spasm: Secondary | ICD-10-CM

## 2021-01-12 DIAGNOSIS — M7918 Myalgia, other site: Secondary | ICD-10-CM

## 2021-01-12 DIAGNOSIS — M6281 Muscle weakness (generalized): Secondary | ICD-10-CM | POA: Diagnosis not present

## 2021-01-12 DIAGNOSIS — R278 Other lack of coordination: Secondary | ICD-10-CM | POA: Diagnosis not present

## 2021-01-12 DIAGNOSIS — F411 Generalized anxiety disorder: Secondary | ICD-10-CM | POA: Diagnosis not present

## 2021-01-12 DIAGNOSIS — R41841 Cognitive communication deficit: Secondary | ICD-10-CM

## 2021-01-12 DIAGNOSIS — M357 Hypermobility syndrome: Secondary | ICD-10-CM | POA: Diagnosis not present

## 2021-01-12 LAB — URINE CULTURE
MICRO NUMBER:: 12275364
SPECIMEN QUALITY:: ADEQUATE

## 2021-01-12 NOTE — Therapy (Signed)
[facility] [address] [place], [place], [address] Phone: [phone]   Fax:  [phone]  Physical Therapy Treatment  Patient Details  Name: Brianna [name] MRN: [id number] Date of Birth: 1995/12/27 Referring Provider (PT): Dr. Miguel Aschoff   Encounter Date: 01/12/2021   PT End of Session - 01/12/21 1019     Visit Number 13    Date for PT Re-Evaluation 02/07/21    Authorization Type BCBS    PT Start Time 0930    PT Stop Time 1015    PT Time Calculation (min) 45 min    Activity Tolerance Patient limited by pain;Patient tolerated treatment well    Behavior During Therapy [facility] for tasks assessed/performed             Past Medical History:  Diagnosis Date   Acute recurrent pansinusitis 10/21/2019   Asthma    Bronchitis    Cat allergy due to both airborne and skin contact 05/01/2016   Chronic sore throat 05/01/2016   Depression    Fibromyalgia    Gastric paresis    Keratosis pilaris    Migraine    Seasonal allergic rhinitis 03/11/2014    Past Surgical History:  Procedure Laterality Date   ESOPHAGOGASTRODUODENOSCOPY ENDOSCOPY     03/2020   SMART PILL PROCEDURE     12/2020   TYMPANOSTOMY TUBE PLACEMENT Bilateral    WISDOM TOOTH EXTRACTION      There were no vitals filed for this visit.   Subjective Assessment - 01/12/21 1251     Subjective RT hip "the same." Pt will see DR Margaretha Sheffield today.    Currently in Pain? Yes   Pt with constant pain, but can be intermittent in regards to where and nature   Pain Frequency Constant    Aggravating Factors  Constant    Pain Relieving Factors Heat feels good           Treatment: Patient seen for aquatic therapy today.  Treatment took place in water 2.5-4 feet deep depending upon activity.  Pt entered the pool via steps independently. Water temp 94 degrees F.  Seated water bench with 75% submersion Pt performed seated LE AROM exercises 20x in all  planes, concurrent pain assessment and status update.   Standing in 75% depth:  Water walking forward & backwards 6x total: half with noodle support and half with no support. Supported against the wall: knee extension/flexion 10x each, abdominal compressions for core 5 sec hold 10x, used Nekadoodle to press in water [employer] 15x Bil holding onto noodle for support Bil hip circumduction 10x , Single limb stance 10 sec 3x Bil, noodle for support                               PT Short Term Goals - 01/05/21 1101       PT SHORT TERM GOAL #1   Title patient is independent with initial HEP    Time 4    Period Weeks    Status Achieved      PT SHORT TERM GOAL #2   Title patient is able to use her dilator/wand correctly to reduce trigger points    Time 4    Period Weeks    Status Achieved      PT SHORT TERM GOAL #3   Title patient is able to perform diaphragmatic breathing to bulge the pelvic floor    Time  4    Period Weeks    Status Achieved      PT SHORT TERM GOAL #4   Title patient understands ways to calm the nervous system to reduce her pain    Time 4    Period Weeks    Status Achieved      PT SHORT TERM GOAL #5   Title patient reports her pain intensity is reduced 25% of the day    Baseline for pelvic floor    Time 4    Period Weeks    Status Achieved               PT Long Term Goals - 01/05/21 1101       PT LONG TERM GOAL #1   Title Patient is independent with advanced HEP    Baseline for pelvic floor    Time 4    Period Months    Status Achieved      PT LONG TERM GOAL #2   Title pelvic pain decreased </= 3/10 50-60% of the time due to reduction of trigger points    Baseline for pelvic floor    Time 4    Period Months    Status Achieved      PT LONG TERM GOAL #3   Title patient is able to fully empty her bowels due to improve pelvic floor coordination during a bowel movement    Time 4    Period Months    Status  Achieved      PT LONG TERM GOAL #4   Title patient is able to fully empty her bladder and is able to wait 2-3 hours to urinate due to relaxation of the pelvic floor    Time 4    Period Months    Status Achieved                   Plan - 01/12/21 1253     Clinical Impression Statement PTA and pt agreed to allow a working range of RT hip 2-4/10 today in the pool which we successfully did. Pt did tolerate more activity in general today. Pt reported pain did travel down the RT LE at the end of session.    Personal Factors and Comorbidities Time since onset of injury/illness/exacerbation;Finances;Sex;Profession;Past/Current Experience    PT Frequency 1x / week    PT Duration Other (comment)    PT Treatment/Interventions ADLs/Self Care Home Management;Biofeedback;Cryotherapy;Electrical Stimulation;Iontophoresis 4mg /ml Dexamethasone;Moist Heat;Ultrasound;Neuromuscular re-education;Therapeutic exercise;Therapeutic activities;Patient/family education;Manual techniques;Dry needling;Taping;Spinal Manipulations;Joint Manipulations;Aquatic Therapy    PT Next Visit Plan discharge to HEP; will continue with aquatic program for her Fibromyalgia    PT Home Exercise Plan Access Code: [email or web address]    Consulted and Agree with Plan of Care Patient             Patient will benefit from skilled therapeutic intervention in order to improve the following deficits and impairments:  Decreased coordination, Increased fascial restricitons, Decreased endurance, Decreased activity tolerance, Dizziness, Pain, Decreased strength  Visit Diagnosis: Muscle weakness (generalized)  Other lack of coordination  Myofascial muscle pain  Cognitive communication deficit  Cramp and spasm     Problem List Patient Active Problem List   Diagnosis Date Noted   Headache 11/30/2020   Positive ANA (antinuclear antibody) 11/15/2020   Fibromyalgia 11/02/2020   Hair loss 11/02/2020   Muscle spasticity 10/22/2020    Rash and other nonspecific skin eruption 10/22/2020   Paresthesia of bilateral legs 10/06/2020   Myalgia 10/06/2020   Chronic  pelvic pain in female 10/06/2020   Chronic abdominal pain 10/06/2020   IBS (irritable bowel syndrome) 08/02/2020   Pelvic floor dysfunction 08/02/2020   Irritant dermatitis 08/02/2020   Gastroesophageal reflux disease 06/28/2020   Severe recurrent major depression without psychotic features (HCC) 06/28/2020   Polyarthralgia 06/28/2020   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 06/28/2020   Tachycardia 06/28/2020   History of COVID-19 06/28/2020   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 deficit 06/28/2020   Recurrent major depressive disorder, in full remission (HCC) 06/28/2020   Atypical chest pain 06/22/2020   Dizziness 06/22/2020   SOB (shortness of breath) 06/22/2020   Palpitations 06/14/2020   Anxiety 05/04/2020   Internal and external hemorrhoids without complication 04/29/2020   ETD (Eustachian tube dysfunction), bilateral 10/21/2019   Acne vulgaris 03/11/2014   Mild intermittent asthma without complication 03/11/2014   Seasonal allergies 03/11/2014    Taksh Hjort, PTA 01/12/2021, 12:56 PM  [facility] [address], [address] [place], [place], [address] Phone: [phone]   Fax:  [phone]  Name: Brianna [name] MRN: [id number] Date of Birth: 1996-01-14

## 2021-01-13 ENCOUNTER — Ambulatory Visit: Payer: BC Managed Care – PPO | Admitting: Behavioral Health

## 2021-01-13 ENCOUNTER — Other Ambulatory Visit: Payer: Self-pay | Admitting: Obstetrics & Gynecology

## 2021-01-13 DIAGNOSIS — F411 Generalized anxiety disorder: Secondary | ICD-10-CM

## 2021-01-13 DIAGNOSIS — F331 Major depressive disorder, recurrent, moderate: Secondary | ICD-10-CM

## 2021-01-13 MED ORDER — NITROFURANTOIN MONOHYD MACRO 100 MG PO CAPS: 100.0000 mg | ORAL_CAPSULE | Freq: Two times a day (BID) | ORAL | 0 refills | Status: DC

## 2021-01-13 NOTE — Progress Notes (Signed)
Urine grew e coli.  Brianna [name] is symptomatic.  Will treat with macrobid for 5 days

## 2021-01-13 NOTE — BH Specialist Note (Signed)
Integrated Behavioral Health via Telemedicine Visit  01/13/2021 Lendy Dittrich [id number]  Number of Integrated Behavioral Health visits: 9 Session Start time: 2:10pm  Session End time: 2:50pm Total time: 40   Referring Provider: DR. Merrilyn Puma, MD Patient/Family location: Pt is in car driving to [facility] Provider location: [facility] Office All persons participating in visit: Pt & Clinician Types of Service: Individual psychotherapy  I connected with [name] and/or Revonda Standard Schmit's  self  via  Telephone or Engineer, civil (consulting)  (Video is Surveyor, mining) and verified that I am speaking with the correct person using two identifiers. Discussed confidentiality:  9th visit  I discussed the limitations of telemedicine and the availability of in person appointments.  Discussed there is a possibility of technology failure and discussed alternative modes of communication if that failure occurs.  I discussed that engaging in this telemedicine visit, they consent to the provision of behavioral healthcare and the services will be billed under their insurance.  Patient and/or legal guardian expressed understanding and consented to Telemedicine visit:  9th visit  Presenting Concerns: Patient and/or family reports the following symptoms/concerns: elevated anx/dep due to wait time for medical results & pending labs Duration of problem: over a year; Severity of problem: moderate  Patient and/or Family's Strengths/Protective Factors: Social connections, Social and Emotional competence, and Concrete supports in place (healthy food, safe environments, etc.)  Goals Addressed: Patient will:  Reduce symptoms of: anxiety, depression, mood instability, and stress   Increase knowledge and/or ability of: coping skills, healthy habits, self-management skills, and stress reduction   Demonstrate ability to: Increase healthy adjustment to current life  circumstances, Increase adequate support systems for patient/family, and reduce Pt tendency to medicalize her anxiety  Progress towards Goals: Ongoing  Interventions: Interventions utilized:  Solution-Focused Strategies and Supportive Counseling Standardized Assessments completed:  screeners prn  Patient and/or Family Response: Pt receptive to call today, although frustrated w/the Healthcare system @ every step.  Pt will f/u on all Referrals provided & meet again w/Cl[facility]ian in 2-3 wks.  Assessment: Patient currently experiencing elevated anx/dep over health status changes that she cannot fix. Pt has multiple appts for her healthcare needs & is trying to manage these.   Suggested to Pt again that her anxiety can possibly be causing some of the health issues, especially gastro issues. Offered to Pt it is a big step to address anxiety & her trauma Hx as an overall part of her Tx.  Patient may benefit from consistent psychotherapy weekly for trauma using EMDR a/o tapping.   Plan: Follow up with behavioral health clinician on : 2-3 wks on telehealth for 60 min Behavioral recommendations: Schedule your worry & limit to 2-15 min periods/day. Search for the best fit w/a Trauma Specialist. Referral(s): Integrated [employer] (In Clinic) and multiple Referrals to Therapists in the Community.  I discussed the assessment and treatment plan with the patient and/or parent/guardian. They were provided an opportunity to ask questions and all were answered. They agreed with the plan and demonstrated an understanding of the instructions.   They were advised to call back or seek an in-person evaluation if the symptoms worsen or if the condition fails to improve as anticipated.  [name], LMFT

## 2021-01-17 DIAGNOSIS — Z113 Encounter for screening for infections with a predominantly sexual mode of transmission: Secondary | ICD-10-CM | POA: Diagnosis not present

## 2021-01-18 LAB — HEPATITIS C ANTIBODY
Hepatitis C Ab: NONREACTIVE
SIGNAL TO CUT-OFF: 0.01 (ref <1.00)

## 2021-01-18 LAB — HIV ANTIBODY (ROUTINE TESTING W REFLEX): HIV 1&2 Ab, 4th Generation: NONREACTIVE

## 2021-01-18 LAB — RPR: RPR Ser Ql: NONREACTIVE

## 2021-01-18 LAB — HEPATITIS B SURFACE ANTIGEN: Hepatitis B Surface Ag: NONREACTIVE

## 2021-01-19 ENCOUNTER — Other Ambulatory Visit: Payer: BC Managed Care – PPO

## 2021-01-19 ENCOUNTER — Encounter: Payer: Self-pay | Admitting: Physical Therapy

## 2021-01-19 ENCOUNTER — Ambulatory Visit: Payer: BC Managed Care – PPO | Admitting: Physical Therapy

## 2021-01-19 ENCOUNTER — Other Ambulatory Visit: Payer: Self-pay

## 2021-01-19 DIAGNOSIS — R278 Other lack of coordination: Secondary | ICD-10-CM | POA: Diagnosis not present

## 2021-01-19 DIAGNOSIS — R41841 Cognitive communication deficit: Secondary | ICD-10-CM

## 2021-01-19 DIAGNOSIS — M7918 Myalgia, other site: Secondary | ICD-10-CM | POA: Diagnosis not present

## 2021-01-19 DIAGNOSIS — M6281 Muscle weakness (generalized): Secondary | ICD-10-CM | POA: Diagnosis not present

## 2021-01-19 DIAGNOSIS — R252 Cramp and spasm: Secondary | ICD-10-CM | POA: Diagnosis not present

## 2021-01-19 DIAGNOSIS — F411 Generalized anxiety disorder: Secondary | ICD-10-CM | POA: Diagnosis not present

## 2021-01-19 NOTE — Therapy (Signed)
[facility] [address] [place], [place], [address] Phone: [phone]   Fax:  [phone]  Physical Therapy Treatment  Patient Details  Name: Brianna [name] MRN: [id number] Date of Birth: 1996/02/17 Referring Provider (PT): Dr. Miguel Aschoff   Encounter Date: 01/19/2021   PT End of Session - 01/19/21 1135     Visit Number 14    Date for PT Re-Evaluation 02/07/21    Authorization Type BCBS    PT Start Time 0930    PT Stop Time 1008    PT Time Calculation (min) 38 min    Activity Tolerance Patient tolerated treatment well    Behavior During Therapy [facility] for tasks assessed/performed             Past Medical History:  Diagnosis Date   Acute recurrent pansinusitis 10/21/2019   Asthma    Bronchitis    Cat allergy due to both airborne and skin contact 05/01/2016   Chronic sore throat 05/01/2016   Depression    Fibromyalgia    Gastric paresis    Keratosis pilaris    Migraine    Seasonal allergic rhinitis 03/11/2014    Past Surgical History:  Procedure Laterality Date   ESOPHAGOGASTRODUODENOSCOPY ENDOSCOPY     03/2020   SMART PILL PROCEDURE     12/2020   TYMPANOSTOMY TUBE PLACEMENT Bilateral    WISDOM TOOTH EXTRACTION      There were no vitals filed for this visit.   Subjective Assessment - 01/19/21 1132     Subjective Hip is sore after exercise but pt is tolerating it.    Pertinent History gastroparesis I dropped 50#;  now holding 105# but considered 25# underweight (working with nutritionist)    How long can you walk comfortably? < 1/2 mile;  walk a lot at work    Patient Stated Goals reduce pelvic floor pain and increase strength. 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 Yes    Pain Score 2     Pain Location Hip     Pain Orientation Right    Pain Descriptors / Indicators Sore    Pain Type Chronic pain    Aggravating Factors  Fairly constant, if I overdo    Pain Relieving Factors Heat, being distracted I find is helpsful    Multiple Pain Sites No              Treatment: Patient seen for aquatic therapy today.  Treatment took place in water 2.5-4 feet deep depending upon activity.  Pt entered the pool via stairs, reciprocally. Water temp 94 degrees F.  Seated water bench with 75% submersion Pt performed seated LE AROM exercises 20x in all planes, added ankle flotations to ankles for LAQ 10x bil  75% submersion for the following: Water walking forward and backward 8x with large noodle Hip circles 10x bil to pt's tolerance SLS RT with LT LE tapping on underwater bench forward & back 10x, then side taps 10x, requires light support of pool wall Intermittent 30 sec rest breaks for hip Underwater bicycle on yellow noodle horse position 30 sec 3x, then final decompression float x 2 min  PT Short Term Goals - 01/05/21 1101       PT SHORT TERM GOAL #1   Title patient is independent with initial HEP    Time 4    Period Weeks    Status Achieved      PT SHORT TERM GOAL #2   Title patient is able to use her dilator/wand correctly to reduce trigger points    Time 4    Period Weeks    Status Achieved      PT SHORT TERM GOAL #3   Title patient is able to perform diaphragmatic breathing to bulge the pelvic floor    Time 4    Period Weeks    Status Achieved      PT SHORT TERM GOAL #4   Title patient understands ways to calm the nervous system to reduce her pain    Time 4    Period Weeks    Status Achieved      PT SHORT TERM GOAL #5   Title patient reports her pain intensity is reduced 25% of the day    Baseline for pelvic floor    Time 4    Period Weeks    Status Achieved               PT Long Term Goals - 01/05/21 1101       PT  LONG TERM GOAL #1   Title Patient is independent with advanced HEP    Baseline for pelvic floor    Time 4    Period Months    Status Achieved      PT LONG TERM GOAL #2   Title pelvic pain decreased </= 3/10 50-60% of the time due to reduction of trigger points    Baseline for pelvic floor    Time 4    Period Months    Status Achieved      PT LONG TERM GOAL #3   Title patient is able to fully empty her bowels due to improve pelvic floor coordination during a bowel movement    Time 4    Period Months    Status Achieved      PT LONG TERM GOAL #4   Title patient is able to fully empty her bladder and is able to wait 2-3 hours to urinate due to relaxation of the pelvic floor    Time 4    Period Months    Status Achieved                   Plan - 01/19/21 1137     Clinical Impression Statement Pt's hip holding steady with aquatic exercise. pt was able to get in her mother's pool this week and exercise. Pt was able to keep her hip pain at 3/10 by the end of todays session, although at about that time pt felt it was time to end so it did not get worse. Pt was able to continue with the same load of work she performned at last week with slightly less pain at the end ( last week was 4/10).    Personal Factors and Comorbidities Time since onset of injury/illness/exacerbation;Finances;Sex;Profession;Past/Current Experience    Examination-Activity Limitations Locomotion Level;Toileting;Stand    Examination-Participation Restrictions Occupation;Community Activity;Interpersonal Relationship    Stability/Clinical Decision Making Stable/Uncomplicated    Rehab Potential Excellent    PT Frequency 1x / week    PT Duration Other (comment)    PT Treatment/Interventions ADLs/Self Care Home Management;Biofeedback;Cryotherapy;Electrical Stimulation;Iontophoresis 4mg /ml Dexamethasone;Moist Heat;Ultrasound;Neuromuscular  re-education;Therapeutic exercise;Therapeutic activities;Patient/family  education;Manual techniques;Dry needling;Taping;Spinal Manipulations;Joint Manipulations;Aquatic Therapy    PT Next Visit Plan discharge to HEP; will continue with aquatic program for her Fibromyalgia    PT Home Exercise Plan Access Code: [email or web address]    Consulted and Agree with Plan of Care Patient             Patient will benefit from skilled therapeutic intervention in order to improve the following deficits and impairments:  Decreased coordination, Increased fascial restricitons, Decreased endurance, Decreased activity tolerance, Dizziness, Pain, Decreased strength  Visit Diagnosis: Muscle weakness (generalized)  Other lack of coordination  Cognitive communication deficit  Cramp and spasm     Problem List Patient Active Problem List   Diagnosis Date Noted   Headache 11/30/2020   Positive ANA (antinuclear antibody) 11/15/2020   Fibromyalgia 11/02/2020   Hair loss 11/02/2020   Muscle spasticity 10/22/2020   Rash and other nonspecific skin eruption 10/22/2020   Paresthesia of bilateral legs 10/06/2020   Myalgia 10/06/2020   Chronic pelvic pain in female 10/06/2020   Chronic abdominal pain 10/06/2020   IBS (irritable bowel syndrome) 08/02/2020   Pelvic floor dysfunction 08/02/2020   Irritant dermatitis 08/02/2020   Gastroesophageal reflux disease 06/28/2020   Severe recurrent major depression without psychotic features (HCC) 06/28/2020   Polyarthralgia 06/28/2020   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 06/28/2020   Tachycardia 06/28/2020   History of COVID-19 06/28/2020   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 deficit 06/28/2020   Recurrent major depressive disorder, in full remission (HCC) 06/28/2020   Atypical chest pain 06/22/2020   Dizziness 06/22/2020   SOB (shortness of breath) 06/22/2020   Palpitations 06/14/2020   Anxiety 05/04/2020   Internal and external hemorrhoids without complication 04/29/2020   ETD (Eustachian tube  dysfunction), bilateral 10/21/2019   Acne vulgaris 03/11/2014   Mild intermittent asthma without complication 03/11/2014   Seasonal allergies 03/11/2014    Brianna [name], PTA 01/19/2021, 11:41 AM  [facility] [address], [address] [place], [place], [address] Phone: [phone]   Fax:  [phone]  Name: Brianna [name] MRN: [id number] Date of Birth: [birth year]

## 2021-01-20 DIAGNOSIS — K3184 Gastroparesis: Secondary | ICD-10-CM | POA: Diagnosis not present

## 2021-01-25 ENCOUNTER — Ambulatory Visit (INDEPENDENT_AMBULATORY_CARE_PROVIDER_SITE_OTHER): Payer: BC Managed Care – PPO | Admitting: Family Medicine

## 2021-01-25 ENCOUNTER — Ambulatory Visit: Payer: BC Managed Care – PPO | Admitting: Sports Medicine

## 2021-01-25 ENCOUNTER — Other Ambulatory Visit: Payer: Self-pay

## 2021-01-25 DIAGNOSIS — K3184 Gastroparesis: Secondary | ICD-10-CM | POA: Diagnosis not present

## 2021-01-25 NOTE — Patient Instructions (Addendum)
Goals: 1. Eat at least 6 times daily, with your first meal within the first hour of being up.    In addition to smoothies, incorporate liquid calories at least once daily, e.g., 8 oz fruit juice like apple, or cranberry or grape.   2. Include sources of high-quality fat (~1 teaspoon) at least 3 X day to maximize energy intake: olive oil, canola oil, avocado, nut butters, mayonnaise (e.g., chicken or tuna salad), fatty fish.   3. Add some ground flax seed to to your smoothie daily.  (Store in Engineer, maintenance (IT).)  Start with 1 level teaspoon.  If well tolerated, increase to 1 1/2 teaspoon, ultimately to a max does of 1 tablespoon.  You should not increase faster than 1/2 teaspoon per week, and not progress to a full tablespoon before 6 weeks.    Note: If you document completing each of the above, (a) you'll be more likely to follow through with each goal, and (b) you will know if it helps you feel better.  Documentation can be as simple as a check mark on a calendar, i.e., a check for each goal.    - Dealing with negative thoughts:    When you are struggling, ask yourself these "decoding" questions:    1. What am I feeling right now?    2. What do I want to feel?    3. What do I truly need right now?    4. What are some things I can do to help me meet my need(s)? Use the Feelings and Needs lists, and WRITE your answers.   You are looking for feelings, not thoughts.  I suggest you discuss your answers with your therapist.  Dealing with negative thoughts can be a first step toward managing the feelings those thoughts prompt.     Follow-up video visit on Monday, October 3 at 11 AM.
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
## 2021-01-26 DIAGNOSIS — K219 Gastro-esophageal reflux disease without esophagitis: Secondary | ICD-10-CM | POA: Diagnosis not present

## 2021-01-26 DIAGNOSIS — R12 Heartburn: Secondary | ICD-10-CM | POA: Diagnosis not present

## 2021-01-26 DIAGNOSIS — F411 Generalized anxiety disorder: Secondary | ICD-10-CM | POA: Diagnosis not present

## 2021-01-26 DIAGNOSIS — K3184 Gastroparesis: Secondary | ICD-10-CM | POA: Diagnosis not present

## 2021-01-26 DIAGNOSIS — R109 Unspecified abdominal pain: Secondary | ICD-10-CM | POA: Diagnosis not present

## 2021-01-26 NOTE — Progress Notes (Signed)
Patient notified of genetic testing results via mychart message and she can access them directly through the invitae website.  Testing results were negative for EDS. 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 She will reach out with any further questions she may have.  Test results were scanned into Epic.

## 2021-01-27 ENCOUNTER — Ambulatory Visit
Payer: BC Managed Care – PPO | Attending: Student in an Organized Health Care Education/Training Program | Admitting: Physical Therapy

## 2021-01-27 ENCOUNTER — Other Ambulatory Visit: Payer: Self-pay

## 2021-01-27 ENCOUNTER — Ambulatory Visit
Admission: RE | Admit: 2021-01-27 | Discharge: 2021-01-27 | Disposition: A | Discharge status: Home / Self Care | Payer: BC Managed Care – PPO | Source: Ambulatory Visit | Attending: Sports Medicine | Admitting: Sports Medicine

## 2021-01-27 DIAGNOSIS — M6281 Muscle weakness (generalized): Secondary | ICD-10-CM | POA: Insufficient documentation

## 2021-01-27 DIAGNOSIS — M7918 Myalgia, other site: Secondary | ICD-10-CM | POA: Insufficient documentation

## 2021-01-27 DIAGNOSIS — M25551 Pain in right hip: Secondary | ICD-10-CM

## 2021-01-27 DIAGNOSIS — S73191A Other sprain of right hip, initial encounter: Secondary | ICD-10-CM | POA: Diagnosis not present

## 2021-01-27 MED ORDER — IOPAMIDOL (ISOVUE-M 200) INJECTION 41%
15.0000 mL | Freq: Once | INTRAMUSCULAR | Status: AC
  Administered 2021-01-27: 15 mL via INTRA_ARTICULAR

## 2021-01-27 NOTE — Therapy (Signed)
[facility] [address] [place], [place], [address] Phone: [phone]   Fax:  [phone]  Physical Therapy Treatment/Discharge Summary   Patient Details  Name: Brianna [name] MRN: [id number] Date of Birth: [birth year] Referring Provider (PT): Dr. Angelica Pou   Encounter Date: 01/27/2021   PT End of Session - 01/27/21 1006     Visit Number 15    Date for PT Re-Evaluation 02/07/21    Authorization Type BCBS    PT Start Time 0930    PT Stop Time 4765   discharge visit   PT Time Calculation (min) 25 min    Activity Tolerance Patient tolerated treatment well             Past Medical History:  Diagnosis Date   Acute recurrent pansinusitis 10/21/2019   Asthma    Bronchitis    Cat allergy due to both airborne and skin contact 05/01/2016   Chronic sore throat 05/01/2016   Depression    Fibromyalgia    Gastric paresis    Keratosis pilaris    Migraine    Seasonal allergic rhinitis 03/11/2014    Past Surgical History:  Procedure Laterality Date   ESOPHAGOGASTRODUODENOSCOPY ENDOSCOPY     03/2020   SMART PILL PROCEDURE     12/2020   TYMPANOSTOMY TUBE PLACEMENT Bilateral    WISDOM TOOTH EXTRACTION      There were no vitals filed for this visit.   Subjective Assessment - 01/27/21 0935     Subjective Liked the pool.  Some good days and bad days.  I think the symptoms are progressing.  Seeing Dr. Micheline Chapman.  Getting hip MRI today.  I think I have venous insuffiency.    Pertinent History gastroparesis I dropped 50#;  now holding 105# but considered 25# underweight (working with nutritionist)    Patient Stated Goals reduce pelvic floor pain and increase strength. 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 Yes    Pain Score 6     Pain  Location Abdomen   multi region pain               [facility] PT Assessment - 01/27/21 0001       Observation/Other Assessments   [employer] [place] [employer] Osteoarthritis Index [facility])  48% indicating moderate to severe difficulty      Strength   Overall Strength Comments has PT order from Dr. Micheline Chapman for hip pain                           [facility] Adult PT Treatment/Exercise - 01/27/21 0001       Self-Care   Other Self-Care Comments  discussion of function and pain response      Knee/Hip Exercises: Seated   Other Seated Knee/Hip Exercises discussion of response to aquatic ex                       PT Short Term Goals - 01/05/21 1101       PT SHORT TERM GOAL #1   Title patient is independent with initial HEP    Time 4    Period Weeks    Status Achieved      PT  SHORT TERM GOAL #2   Title patient is able to use her dilator/wand correctly to reduce trigger points    Time 4    Period Weeks    Status Achieved      PT SHORT TERM GOAL #3   Title patient is able to perform diaphragmatic breathing to bulge the pelvic floor    Time 4    Period Weeks    Status Achieved      PT SHORT TERM GOAL #4   Title patient understands ways to calm the nervous system to reduce her pain    Time 4    Period Weeks    Status Achieved      PT SHORT TERM GOAL #5   Title patient reports her pain intensity is reduced 25% of the day    Baseline for pelvic floor    Time 4    Period Weeks    Status Achieved               PT Long Term Goals - 01/27/21 0623       PT LONG TERM GOAL #1   Title Patient is independent with advanced HEP    Status Achieved      PT LONG TERM GOAL #2   Title pelvic pain decreased </= 3/10 50-60% of the time due to reduction of trigger points    Status Achieved      PT LONG TERM GOAL #3   Title patient is able to fully empty her bowels due to improve pelvic floor coordination during a bowel movement     Status Achieved      PT LONG TERM GOAL #4   Title patient is able to fully empty her bladder and is able to wait 2-3 hours to urinate due to relaxation of the pelvic floor    Status Achieved      PT LONG TERM GOAL #5   Title The patient will be independent in an aquatic ex program    Status Achieved      PT LONG TERM GOAL #6   Title The patient will have an improved WOMAC score to 25% or less    Status Not Met      PT LONG TERM GOAL #7   Title The patient will be able to walk 1/2 mile with pain level 4/10    Status Not Met      PT LONG TERM GOAL #8   Title The patient will report 30% less discomfort and fatigue with weight bearing, morning stiffness and getting on/off the toilet ("moderate rating" on WOMAC instead of "very difficult"    Status Not Met                   Plan - 01/27/21 1007     Clinical Impression Statement The patient  has had several aquatic PT visits to address fibromyalgia as referred by Dr. Evette Doffing.  She has a good understanding of a basic water ex program but has not been able to use her mom's pool as much as she would like.  Her WOMAC questionairre score did worsen and some goals not met secondary to pain chronicity with multiple regions affected.    Will discharge from treatment for fibromyalgia at this time.  She has already completed pelvic floor PT as well.  The patient would like to have further PT for right hip pain as ordered by Dr. Micheline Chapman and that will be scheduled after MRI and follow up with  to doctor to discuss.             Patient will benefit from skilled therapeutic intervention in order to improve the following deficits and impairments:     Visit Diagnosis: Muscle weakness (generalized)  Myofascial muscle pain   PHYSICAL THERAPY DISCHARGE SUMMARY  Visits from Start of Care: 15  Current functional level related to goals / functional outcomes: See clinical impressions above   Remaining deficits: As above   Education /  Equipment:aquatic ex program   Patient agrees to discharge. Patient goals were partially met. Patient is being discharged due to a change in medical status.   Problem List Patient Active Problem List   Diagnosis Date Noted   Headache 11/30/2020   Positive ANA (antinuclear antibody) 11/15/2020   Fibromyalgia 11/02/2020   Hair loss 11/02/2020   Muscle spasticity 10/22/2020   Rash and other nonspecific skin eruption 10/22/2020   Paresthesia of bilateral legs 10/06/2020   Myalgia 10/06/2020   Chronic pelvic pain in female 10/06/2020   Chronic abdominal pain 10/06/2020   IBS (irritable bowel syndrome) 08/02/2020   Pelvic floor dysfunction 08/02/2020   Irritant dermatitis 08/02/2020   Gastroesophageal reflux disease 06/28/2020   Severe recurrent major depression without psychotic features ([place]) 06/28/2020   Polyarthralgia 06/28/2020   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 06/28/2020   Tachycardia 06/28/2020   History of COVID-19 06/28/2020   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 deficit 06/28/2020   Recurrent major depressive disorder, in full remission ([place]) 06/28/2020   Atypical chest pain 06/22/2020   Dizziness 06/22/2020   SOB (shortness of breath) 06/22/2020   Palpitations 06/14/2020   Anxiety 05/04/2020   Internal and external hemorrhoids without complication 30/15/9968   ETD (Eustachian tube dysfunction), bilateral 10/21/2019   Acne vulgaris 03/11/2014   Mild intermittent asthma without complication 95/70/2202   Seasonal allergies 03/11/2014   Ruben Im, PT 01/27/21 2:44 PM Phone: [phone] Fax: [phone]  Alvera Singh, PT 01/27/2021, 2:42 PM  [facility] [address], [place], [place], [address] Phone: [phone]   Fax:  [phone]  Name: Brianna [name] MRN: [id number] Date of Birth: 04/04/1996

## 2021-02-01 ENCOUNTER — Ambulatory Visit (INDEPENDENT_AMBULATORY_CARE_PROVIDER_SITE_OTHER): Payer: BC Managed Care – PPO | Admitting: Sports Medicine

## 2021-02-01 ENCOUNTER — Other Ambulatory Visit: Payer: Self-pay

## 2021-02-01 VITALS — Ht 64.0 in | Wt 103.0 lb

## 2021-02-01 DIAGNOSIS — S73191D Other sprain of right hip, subsequent encounter: Secondary | ICD-10-CM | POA: Diagnosis not present

## 2021-02-01 DIAGNOSIS — M25551 Pain in right hip: Secondary | ICD-10-CM | POA: Diagnosis not present

## 2021-02-01 DIAGNOSIS — Q7962 Hypermobile Ehlers-Danlos syndrome: Secondary | ICD-10-CM

## 2021-02-01 NOTE — Progress Notes (Signed)
Patient ID: [name], female   DOB: 1995/06/11, 25 y.o.   MRN: [id number]  Brianna [name] presents today to discuss MRI arthrogram findings of her right hip.  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  Brianna [name]'s symptoms are worsening.  Based on this finding I recommend consultation with Dr. Magnus Ivan to discuss further treatment.  She is in agreement with this.  She is also here today to discuss recent testing for Ehlers-Danlos syndrome.  Although her blood work was negative for EDS, hypermobile EDS cannot be diagnosed via blood work.  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 The primary treatment for this is targeted physical therapy with a therapist that understands this diagnosis.  This is something that I am happy to arrange for Brianna [name] in the future once her hip is feeling better.  Otherwise, follow-up as needed.

## 2021-02-02 ENCOUNTER — Other Ambulatory Visit: Payer: Self-pay | Admitting: Physical Medicine and Rehabilitation

## 2021-02-02 DIAGNOSIS — R110 Nausea: Secondary | ICD-10-CM | POA: Diagnosis not present

## 2021-02-02 DIAGNOSIS — R1013 Epigastric pain: Secondary | ICD-10-CM | POA: Diagnosis not present

## 2021-02-03 ENCOUNTER — Ambulatory Visit (INDEPENDENT_AMBULATORY_CARE_PROVIDER_SITE_OTHER): Payer: BC Managed Care – PPO | Admitting: Orthopedic Surgery

## 2021-02-03 ENCOUNTER — Other Ambulatory Visit: Payer: Self-pay

## 2021-02-03 ENCOUNTER — Ambulatory Visit: Payer: BC Managed Care – PPO | Admitting: Behavioral Health

## 2021-02-03 DIAGNOSIS — M545 Low back pain, unspecified: Secondary | ICD-10-CM

## 2021-02-03 DIAGNOSIS — S73191A Other sprain of right hip, initial encounter: Secondary | ICD-10-CM | POA: Diagnosis not present

## 2021-02-03 DIAGNOSIS — M25551 Pain in right hip: Secondary | ICD-10-CM | POA: Diagnosis not present

## 2021-02-03 DIAGNOSIS — F331 Major depressive disorder, recurrent, moderate: Secondary | ICD-10-CM

## 2021-02-03 DIAGNOSIS — F411 Generalized anxiety disorder: Secondary | ICD-10-CM

## 2021-02-03 NOTE — BH Specialist Note (Signed)
Integrated Behavioral Health via Telemedicine Visit  02/03/2021 Brianna [name] [id number]  Number of Integrated Behavioral Health visits: 10 Session Start time: 10:00am  Session End time: 11:00am Total time: 60  Referring Provider: Dr. Merrilyn Puma, MD Patient/Family location: Pt is home in private [facility] Provider location: [facility] Office All persons participating in visit: Pt & Clinician Types of Service: Individual psychotherapy  I connected with [name] and/or Revonda Standard Zender's  self  via  Telephone or Engineer, civil (consulting)  (Video is Surveyor, mining) and verified that I am speaking with the correct person using two identifiers. Discussed confidentiality:  10th visit  I discussed the limitations of telemedicine and the availability of in person appointments.  Discussed there is a possibility of technology failure and discussed alternative modes of communication if that failure occurs.  I discussed that engaging in this telemedicine visit, they consent to the provision of behavioral healthcare and the services will be billed under their insurance.  Patient and/or legal guardian expressed understanding and consented to Telemedicine visit:  10th visit  Presenting Concerns: Patient and/or family reports the following symptoms/concerns: Pt has been busy attending Physician appts & organizing her health concerns/priorities w/Providers. Pt maintains an elevated level of anx/dep on a normal basis & relates this to a childhood Hx of her health concerns having little priority to Parents. Pt has struggled to have her health concerns addressed & gains self-efficacy from getting these visits accomplished so she can exp improved medical & mental health wellness. Duration of problem: years; Severity of problem: moderate & can trend severe  Patient and/or Family's Strengths/Protective Factors: Social connections, Social and Emotional competence, Concrete  supports in place (healthy food, safe environments, etc.), Sense of purpose, and Physical Health (exercise, healthy diet, medication compliance, etc.)  Goals Addressed: Patient will:  Reduce symptoms of: anxiety, depression, and stress   Increase knowledge and/or ability of: coping skills, healthy habits, stress reduction, and making choices to move well to prevent flares of pain     Demonstrate ability to: Increase healthy adjustment to current life circumstances  Progress towards Goals: Ongoing  Interventions: Interventions utilized:  Solution-Focused Strategies, Supportive Counseling, and DBT Dialectal Behavioral Therapy Standardized Assessments completed:  screeners prn  Patient and/or Family Response: Pt receptive to call today which is consistent. Pt presents w/inc'd confidence today as she is getting things accomplished for the sake of her health.  Pt is in midst of estb'g w/Community Providers in Oct. 2022. She will be engaged in Pschological & Psychiatric Testing & estb'g w/a new LT Psychotherapist through [facility].  Pt requests a future appt near the end of Sept to segway & provide continuity of care prior to estb'ment w/new Providers.  Assessment: Patient currently experiencing elevated anx/dep due to her intense efforts seeking answers to her Px health issues. Pt has accomplished a great deal since our last visit on 01/13/21. Pt reports her Mother will accompany her to her Endoscopy procedure upcoming & this is helpful.  Patient may benefit from one final session as she transitions to new care Providers in the Community. Pt aware Clinician is available to her through [facility].  Plan: Follow up with behavioral health clinician on : end of Sept for 60 min telehealth session Behavioral recommendations: None @ this time Referral(s): Integrated [facility] (In Clinic)  I discussed the assessment and treatment plan with the patient and/or  parent/guardian. They were provided an opportunity to ask questions and all were answered. They agreed with the plan  and demonstrated an understanding of the instructions.   They were advised to call back or seek an in-person evaluation if the symptoms worsen or if the condition fails to improve as anticipated.  Donnetta Hutching, LMFT

## 2021-02-04 ENCOUNTER — Ambulatory Visit (INDEPENDENT_AMBULATORY_CARE_PROVIDER_SITE_OTHER): Payer: BC Managed Care – PPO | Admitting: Orthopaedic Surgery

## 2021-02-04 ENCOUNTER — Encounter (HOSPITAL_BASED_OUTPATIENT_CLINIC_OR_DEPARTMENT_OTHER): Payer: Self-pay | Admitting: Orthopaedic Surgery

## 2021-02-04 VITALS — BP 115/82 | Ht 64.0 in | Wt 103.0 lb

## 2021-02-04 DIAGNOSIS — S73191A Other sprain of right hip, initial encounter: Secondary | ICD-10-CM

## 2021-02-04 NOTE — Progress Notes (Signed)
Chief Complaint: Right hip pain     History of Present Illness:   Pain Score: 4/10 SANE: 20/100  Brianna [name] is a 25 y.o. female with right hip pain groin pain now going on for over a year.  She states that she feels a popping and grinding in the hip as well.  There is a sensation that the hip feels like it needs to pop.  She has issues with going up and down stairs that aggravates it.  She is currently a Production assistant, radio at [employer] and as a result is having significant difficulty during the day and being on her feet.  She was previously seen by Dr. August Saucer and his reader referred her for an assessment of her right hip particularly in the context of a labral tear.  She has previously tried anti-inflammatories which are not helping.  She has not done physical therapy.  She has pain with prolonged sitting.  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    Surgical History:   None  PMH/PSH/Family History/Social History/Meds/Allergies:    Past Medical History:  Diagnosis Date   Acute recurrent pansinusitis 10/21/2019   Asthma    Bronchitis    Cat allergy due to both airborne and skin contact 05/01/2016   Chronic sore throat 05/01/2016   Depression    Fibromyalgia    Gastric paresis    Keratosis pilaris    Migraine    Seasonal allergic rhinitis 03/11/2014   Past Surgical History:  Procedure Laterality Date   ESOPHAGOGASTRODUODENOSCOPY ENDOSCOPY     03/2020   SMART PILL PROCEDURE     12/2020   TYMPANOSTOMY TUBE PLACEMENT Bilateral    WISDOM TOOTH EXTRACTION     Social History   Socioeconomic History   Marital status: Single    Spouse name: Not on file   Number of children: Not on file   Years of education: Not on file   Highest education level: Not on file  Occupational History   Not on file  Tobacco Use   Smoking status: Former    Types: E-cigarettes, Cigarettes    Quit date: 2021     Years since quitting: 1.7   Smokeless tobacco: Never  Vaping Use   Vaping Use: Former   Substances: Financial trader  Substance and Sexual Activity   Alcohol use: Not Currently   Drug use: Yes    Types: Marijuana   Sexual activity: Yes    Birth control/protection: None  Other Topics Concern   Not on file  Social History Narrative   Right handed   Social Determinants of Health   Financial Resource Strain: Not on file  Food Insecurity: Not on file  Transportation Needs: Not on file  Physical Activity: Not on file  Stress: Not on file  Social Connections: Not on file   Family History  Problem Relation Age of Onset   Diabetes Mother    Polycystic ovary syndrome Mother    GER disease Father    Heart disease Paternal Uncle    Allergies  Allergen Reactions   Clindamycin Hives, Rash and Swelling    Other reaction(s): swelling   Current Outpatient Medications  Medication Sig Dispense Refill   Acetylcysteine (NAC) 600 MG CAPS Take 1 capsule (600 mg total) by mouth  2 (two) times daily. 60 capsule 0   albuterol (VENTOLIN HFA) 108 (90 Base) MCG/ACT inhaler Inhale into the lungs.     cholecalciferol (VITAMIN D) 25 MCG (1000 UNIT) tablet Take 500 Units by mouth.     dicyclomine (BENTYL) 10 MG capsule Take 10 mg by mouth 4 (four) times daily as needed.     Ferrous Sulfate Dried (EQ SLOW-RELEASE IRON) 45 MG TBCR Take 1 tablet by mouth every other day. Every 3 days     fluticasone (FLONASE) 50 MCG/ACT nasal spray SPRAY 1 SPRAY INTO BOTH NOSTRILS DAILY. 16 mL 1   loratadine (CLARITIN) 10 MG tablet Take 1 tablet (10 mg total) by mouth daily. 90 tablet 1   midodrine (PROAMATINE) 2.5 MG tablet Take 1 tablet (2.5 mg total) by mouth daily. 30 tablet 3   nitrofurantoin, macrocrystal-monohydrate, (MACROBID) 100 MG capsule Take 1 capsule (100 mg total) by mouth 2 (two) times daily. 10 capsule 0   nortriptyline (PAMELOR) 50 MG capsule TAKE 1 CAPSULE BY MOUTH AT BEDTIME. 90 capsule 2    ondansetron (ZOFRAN ODT) 4 MG disintegrating tablet Take 1 tablet (4 mg total) by mouth every 8 (eight) hours as needed for nausea or vomiting. 20 tablet 5   pregabalin (LYRICA) 25 MG capsule Take 25 mg by mouth in the morning and at bedtime. Patient will start taking twice daily in 1 week.     Prenatal Vit-Fe Fumarate-FA (PRENATAL PO) Take by mouth daily.     Prucalopride Succinate 2 MG TABS Take by mouth.     rizatriptan (MAXALT-MLT) 10 MG disintegrating tablet Take 1 tablet earliest onset of migraine.  May repeat in 2 hours.  Maximum 2 tablets in 24 hours. (Patient taking differently: Take 1 tablet earliest onset of migraine.  May repeat in 2 hours.  Maximum 2 tablets in 24 hours.) 10 tablet 5   No current facility-administered medications for this visit.   No results found.  Review of Systems:   A ROS was performed including pertinent positives and negatives as documented in the HPI.  Physical Exam :   Constitutional: NAD and appears stated age Neurological: Alert and oriented Psych: Appropriate affect and cooperative Blood pressure 115/82, height 5\' 4"  (1.626 m), weight 103 lb (46.7 kg).   Comprehensive Musculoskeletal Exam:    Inspection Right Left  Skin No atrophy or gross abnormalities appreciated No atrophy or gross abnormalities appreciated  Palpation    Tenderness None None  Crepitus None None  Range of Motion    Flexion (passive) 120 120  Extension 30 30  IR 40 with pain 30  ER 40 40  Strength    Flexion  5/5 5/5  Extension 5/5 5/5  Special Tests    FABIR Negative Negative  FADER Negative Negative  ER Lag/Capsular Insufficiency Negative Negative  Instability Negative Negative  Sacroiliac pain Negative  Negative   Instability    Generalized Laxity No No  Neurologic    sciatic, femoral, obturator nerves intact to light sensation  Vascular/Lymphatic    DP pulse 2+ 2+  Lumbar Exam    Patient has symmetric lumbar range of motion with negative pain referral to hip      Imaging:   Xray (right hip and AP pelvis): There is a crossover sign with  MRI (right hip): There is a labral tear involving the right hip anterior superior.  I personally reviewed and interpreted the radiographs.   Assessment:   25 year old female with right hip pain in the setting of  a known labral tear.  She does have evidence of cephalad retroversion which I have discussed as a potential contributor to her issue.  We did discuss that this is in the setting of ligamentous laxity this can also exacerbate right hip issues.  Regardless I think that she would be a candidate for physical therapy for gluteal strengthening in order to improve her dynamic stabilizers.  I would also like to perform a lidocaine injection into the right hip today that we can further assess how much of her pain is truly emanating from the right hip  Plan :    -Right hip lidocaine injection performed today -Physical therapy for gluteus strengthening about the right hip -We will see her back in 1 week to reassess     I personally saw and evaluated the patient, and participated in the management and treatment plan.  [name], MD Attending Physician, Orthopedic Surgery  This document was dictated using Dragon voice recognition software. A reasonable attempt at proof reading has been made to minimize errors.

## 2021-02-04 NOTE — Telephone Encounter (Signed)
I have schedule phone visit for 02/08/21 at 2:00. Looks like you already notified patient.

## 2021-02-05 ENCOUNTER — Encounter: Payer: Self-pay | Admitting: Orthopedic Surgery

## 2021-02-05 NOTE — Progress Notes (Signed)
Office Visit Note   Patient: Brianna [name]           Date of Birth: Jul 08, 1995           MRN: [id number] Visit Date: 02/03/2021 Requested by: Ralene Cork, DO [id number] [place],  [place] [address] PCP: Rudene Christians, DO  Subjective: Chief Complaint  Patient presents with   Right Hip - Pain    HPI: Mareta Chesnut is a 25 y.o. female who presents to the office complaining of right hip pain and low back pain.  Right hip pain is her main complaint.  She is unsure of any specific injury but has noticed pain for several months.  Her pain is severe and she ambulates with a limp.  Localizes pain to the anterior groin with radiation to her lateral hip.  She also has radiation of pain down her anterior thigh to her knee.  She has a clicking sensation in her right hip.  This pain is worse with stairs and specifically going up stairs.  She cannot sleep on her right side.  She wakes with pain at night.  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 Margaretha Sheffield.  She also has a history of gastroparesis and migraines as well as fibromyalgia.  She has done dance when she was younger and also had a snapping sensation in her hip at that time but this is much worse and more painful.  She has no history of prior hip surgery.  She has tried Lyrica which has not provided any relief.  She has had an MRI that revealed full-thickness tear of the right anterior superior labrum.  She works as a Production assistant, radio at [employer] but states that she is considering leaving her job as her pain is so severe throughout her daily life.  Lifting and standing for long periods of time also causes her pain.  She also endorses low back pain mostly localized to the axial paraspinal regions.  She has no radicular pain or numbness and tingling down the leg.              ROS: All systems reviewed are negative as they relate to the chief complaint within the history of  present illness.  Patient denies fevers or chills.  Assessment & Plan: Visit Diagnoses:  1. Low back pain, unspecified back pain laterality, unspecified chronicity, unspecified whether sciatica present   2. Pain in right hip     Plan: Patient is a 25 year old female who presents complaining of right hip pain and low back pain.  Has history of chronic pain but this right hip pain is been bothering her more more over the last several months.  She has MRI demonstrating labral tear of the right hip which was reviewed with her today.  Plan to refer patient to Dr. Steward Drone for further evaluation and discussion of options.  Additionally, she has chronic pain of her lumbar spine as well.  She has had [facility].  Ordered MRI for further evaluation of her chronic low back pain.  I think it is unlikely that she has any type of operative back pathology but she does report some low back pain and if that can be ruled out as a contributing factor for any of her pelvic symptoms then arthroscopic debridement and repair of the right hip labrum could potentially have a better chance at success.  llow-Up Instructions: No follow-ups on file.  Orders:  Orders Placed This Encounter  Procedures   MR Lumbar Spine w/o contrast   Ambulatory referral to Orthopedic Surgery   No orders of the defined types were placed in this encounter.     Procedures: No procedures performed   Clinical Data: No additional findings.  Objective: Vital Signs: There were no vitals taken for this visit.  Physical Exam:  Constitutional: Patient appears well-developed HEENT:  Head: Normocephalic Eyes:EOM are normal Neck: Normal range of motion Cardiovascular: Normal rate Pulmonary/chest: Effort normal Neurologic: Patient is alert Skin: Skin is warm Psychiatric: Patient has normal mood and affect  Ortho Exam: Ortho exam demonstrates right hip with increased pain with terminal hip flexion as well as  internal rotation.  Positive Stinchfield sign.  No pain with logroll of the right hip.  No tenderness over the greater trochanters bilaterally.  Negative straight leg raise.  Tenderness throughout the axial lumbar spine and paraspinal musculature.  Ambulates with antalgia.  5/5 motor strength of bilateral hip flexion, quadricep, hamstring, dorsiflexion, plantarflexion.  Increased pain with hip flexion strength testing.  Snapping sensation noted with passive range of motion of the right hip.  Specialty Comments:  No specialty comments available.  Imaging: No results found.   PMFS History: Patient Active Problem List   Diagnosis Date Noted   Headache 11/30/2020   Positive ANA (antinuclear antibody) 11/15/2020   Fibromyalgia 11/02/2020   Hair loss 11/02/2020   Muscle spasticity 10/22/2020   Rash and other nonspecific skin eruption 10/22/2020   Paresthesia of bilateral legs 10/06/2020   Myalgia 10/06/2020   Chronic pelvic pain in female 10/06/2020   Chronic abdominal pain 10/06/2020   IBS (irritable bowel syndrome) 08/02/2020   Pelvic floor dysfunction 08/02/2020   Irritant dermatitis 08/02/2020   Gastroesophageal reflux disease 06/28/2020   Severe recurrent major depression without psychotic features (HCC) 06/28/2020   Polyarthralgia 06/28/2020   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 06/28/2020   Tachycardia 06/28/2020   History of COVID-19 06/28/2020   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 deficit 06/28/2020   Recurrent major depressive disorder, in full remission (HCC) 06/28/2020   Atypical chest pain 06/22/2020   Dizziness 06/22/2020   SOB (shortness of breath) 06/22/2020   Palpitations 06/14/2020   Anxiety 05/04/2020   Internal and external hemorrhoids without complication 04/29/2020   ETD (Eustachian tube dysfunction), bilateral 10/21/2019   Acne vulgaris 03/11/2014   Mild intermittent asthma without complication 03/11/2014   Seasonal  allergies 03/11/2014   Past Medical History:  Diagnosis Date   Acute recurrent pansinusitis 10/21/2019   Asthma    Bronchitis    Cat allergy due to both airborne and skin contact 05/01/2016   Chronic sore throat 05/01/2016   Depression    Fibromyalgia    Gastric paresis    Keratosis pilaris    Migraine    Seasonal allergic rhinitis 03/11/2014    Family History  Problem Relation Age of Onset   Diabetes Mother    Polycystic ovary syndrome Mother    GER disease Father    Heart disease Paternal Uncle     Past Surgical History:  Procedure Laterality Date   ESOPHAGOGASTRODUODENOSCOPY ENDOSCOPY     03/2020   SMART PILL PROCEDURE     12/2020   TYMPANOSTOMY TUBE PLACEMENT Bilateral    WISDOM TOOTH EXTRACTION     Social History   Occupational History   Not on file  Tobacco Use   Smoking status: Former    Types: E-cigarettes, Cigarettes    Quit  date: 2021    Years since quitting: 1.7   Smokeless tobacco: Never  Vaping Use   Vaping Use: Former   Substances: Financial trader  Substance and Sexual Activity   Alcohol use: Not Currently   Drug use: Yes    Types: Marijuana   Sexual activity: Yes    Birth control/protection: None

## 2021-02-06 ENCOUNTER — Encounter: Payer: Self-pay | Admitting: Orthopedic Surgery

## 2021-02-08 ENCOUNTER — Ambulatory Visit: Payer: BC Managed Care – PPO | Admitting: Physical Therapy

## 2021-02-08 ENCOUNTER — Encounter
Payer: BC Managed Care – PPO | Attending: Physical Medicine and Rehabilitation | Admitting: Physical Medicine and Rehabilitation

## 2021-02-08 ENCOUNTER — Other Ambulatory Visit: Payer: Self-pay

## 2021-02-08 DIAGNOSIS — G4701 Insomnia due to medical condition: Secondary | ICD-10-CM | POA: Diagnosis not present

## 2021-02-08 DIAGNOSIS — G43709 Chronic migraine without aura, not intractable, without status migrainosus: Secondary | ICD-10-CM | POA: Insufficient documentation

## 2021-02-08 DIAGNOSIS — K30 Functional dyspepsia: Secondary | ICD-10-CM | POA: Diagnosis not present

## 2021-02-08 DIAGNOSIS — R5382 Chronic fatigue, unspecified: Secondary | ICD-10-CM

## 2021-02-08 DIAGNOSIS — M6289 Other specified disorders of muscle: Secondary | ICD-10-CM | POA: Insufficient documentation

## 2021-02-08 NOTE — Progress Notes (Signed)
Subjective:    [name], female    DOB: [birth year], 25 y.o.   MRN: [id number]  HPI  An audio/video tele-health visit is felt to be the most appropriate encounter for this patient at this time.This is a follow up tele-visit via phone. The patient is at home. MD is at office.    Brianna [name] is a 25 year old woman who presents for follow-up of her hypermobility- related pains, insomnia, palpitations, and dizziness.   1) Chronic nerve pain:  -Pain on average is 6/10 -Pain right now is 3/10 -She uses a heating pad. Alternates ice for short times -She has occasional flare-ups concentrated around her mid-section.  -She is currently on Cymbalta which can help with nerve pain.  -she was started on Gabapentin and felt double vision and tingling on lips.   2) Pelvic floor dysfunction: -She is starting with a new therapist soon.  -Had a pelvic MRI at [facility] and reviewed this with her and was negative.   3) GI symptoms -Struggled with these since she was 25 years old.   4) History of multiple suicidal ideations  5) Spasms: -present throughout her body -her chriopractor may start her on a muscle relaxer.   6) Depression:  -suffered night sweats from Cymbalta.   7) Insomnia:  -sleep has been very poor for a year.  -her job ends at 10:30.  -she did not find Nortryptyline 40mg  to be very helpful -she has tried Amitriptyline in the past but cannot remember her side effect to it- thinks it may have been grogginess.   8) Anxiety: -she takes clonazepam as needed.  -has been working long hours at night without breaks -received a note saying she can only do 4 days per week.  9) ADHD: has not found much improvement in focus with Ritalin.   10) Migraines- has been following with neurology.Has not found great relief with the Rizatriptan. The Nortriptyline seems to have been making her thoughts more negative.   11) Right hip pain getting worse despite aquatic  therapy -she was found to have a labral tear on MRI and has a surgical consultation on Friday.   12) Palpitations -she has had since she was a child  13) Dizziness: -she often has symptoms when she stands from a seated position -she tried midodrine but experienced side effects -she does have a blood pressure cuff to check her pressures at home -she usually checks her BP when she is in a seated position.      Pain Inventory Average Pain 7 Pain Right Now 4 My pain is constant, sharp, dull, and tingling  In the last 24 hours, has pain interfered with the following? General activity 5 Relation with others 5 Enjoyment of life 8 What TIME of day is your pain at its worst? morning  and evening Sleep (in general) Poor  Pain is worse with: walking, sitting, inactivity, and standing Pain improves with: heat/ice and pacing activities Relief from Meds: 0      Family History  Problem Relation Age of Onset   Diabetes Mother    Polycystic ovary syndrome Mother    GER disease Father    Heart disease Paternal Uncle    Social History   Socioeconomic History   Marital status: Single    Spouse name: Not on file   Number of children: Not on file   Years of education: Not on file   Highest education level: Not on file  Occupational History  Not on file  Tobacco Use   Smoking status: Former    Types: E-cigarettes, Cigarettes    Quit date: 2021    Years since quitting: 1.7   Smokeless tobacco: Never  Vaping Use   Vaping Use: Former   Substances: Financial trader  Substance and Sexual Activity   Alcohol use: Not Currently   Drug use: Yes    Types: Marijuana   Sexual activity: Yes    Birth control/protection: None  Other Topics Concern   Not on file  Social History Narrative   Right handed   Social Determinants of Health   Financial Resource Strain: Not on file  Food Insecurity: Not on file  Transportation Needs: Not on file  Physical Activity: Not on file   Stress: Not on file  Social Connections: Not on file   Past Surgical History:  Procedure Laterality Date   ESOPHAGOGASTRODUODENOSCOPY ENDOSCOPY     03/2020   SMART PILL PROCEDURE     12/2020   TYMPANOSTOMY TUBE PLACEMENT Bilateral    WISDOM TOOTH EXTRACTION     Past Medical History:  Diagnosis Date   Acute recurrent pansinusitis 10/21/2019   Asthma    Bronchitis    Cat allergy due to both airborne and skin contact 05/01/2016   Chronic sore throat 05/01/2016   Depression    Fibromyalgia    Gastric paresis    Keratosis pilaris    Migraine    Seasonal allergic rhinitis 03/11/2014   There were no vitals taken for this visit.  Opioid Risk Score:   Fall Risk Score:  `1  Depression screen PHQ 2/9  Depression screen [facility] 2/9 01/11/2021 11/02/2020 10/11/2020 09/28/2020 09/21/2020 07/29/2020 07/09/2020  Decreased Interest 1 2 1 1  0 2 3  Down, Depressed, Hopeless 1 2 1 1 1 2 3   PHQ - 2 Score 2 4 2 2 1 4 6   Altered sleeping - 3 - - 3 3 3   Tired, decreased energy - 3 - - 3 3 3   Change in appetite - 3 - - 3 3 3   Feeling bad or failure about yourself  - 3 - - 2 3 3   Trouble concentrating - 3 - - 3 3 3   Moving slowly or fidgety/restless - 3 - - 2 3 3   Suicidal thoughts - 1 - - 1 1 1   PHQ-9 Score - 23 - - 18 23 25   Difficult doing work/chores - Extremely dIfficult - - Extremely dIfficult Very difficult Extremely dIfficult    Review of Systems  Constitutional:  Negative for diaphoresis and unexpected weight change.  HENT: Negative.    Eyes: Negative.   Respiratory:  Negative for shortness of breath.   Cardiovascular:  Negative for leg swelling.  Gastrointestinal:  Negative for abdominal pain, constipation, diarrhea and nausea.  Endocrine: Negative.   Genitourinary:  Positive for pelvic pain.       Bladder control  Musculoskeletal:  Positive for back pain, gait problem and myalgias.       Pain on right side of head , pain in right and left hand ,   Skin: Negative.    Allergic/Immunologic: Negative.   Neurological:  Negative for dizziness and weakness.       Tingling  Hematological:  Does not bruise/bleed easily.  Psychiatric/Behavioral:  Negative for confusion, dysphoric mood and suicidal ideas. The patient is not nervous/anxious.   All other systems reviewed and are negative.     Objective:   Physical Exam Not performed as patient was seen  via phone.     Assessment & Plan:  1) Chronic Pain Syndrome secondary to fibromyalgia -Discussed current symptoms of pain and history of pain.  -Discussed benefits of exercise in reducing pain. -Apply emu oil. -discussed benefits of minimizing medications.  -Lupus panel ordered.  -RA and ANA reviewed and negative.  -Discussed trigger point injections -Discussed following foods that may reduce pain: 1) Ginger 2) Blueberries 3) Salmon 4) Pumpkin seeds 5) dark chocolate 6) turmeric 7) tart cherries 8) virgin olive oil 9) chilli peppers 10) mint  Link to further information on diet for chronic pain: [email or web address]  2) Pelvic floor dysfunction: -continue pelvic floor therapy.  -estrogen, testosterone,  level  3) Depression: -she has been going to therapy twice per week. -had negative response to Cymbalta (night sweats).  -discussed Remeron and Amitriptyline as medication options- she cannot remember why she stopped these in the pasts. Will defer until we see her blood pressure/HR trends.   4) Functional dyspepsia: -refer to gastroenterology for gastric emptying study.  -no benefits from Buspar, stop Buspar.   5) contraception: -started a NuvaRing a couple of days ago.   6) pain in lower back and legs: -stop Gabapentin  7) insomnia: -did not have results with Amitriptyline 10mg  or Nortriptyline 50mg - have discontinued both -Get exercise during the day.  -Discussed good sleep hygiene: turning off all devices an hour  before bedtime.  -Chamomile tea, valerian root tea, or tart cherry juice with dinner.  -Did not have benefits from melatonin- discussed chocolate and pistachios to boost natural melatonin production.   8) Foods to alleviate migraine:  1) dark leafy greens 2) avocado 3) tuna 4) samon and mackerel 5) beans and legumes Supplements that can be helpful: feverfew, B12, and magnesium  Foods to avoid in migraine: 1) Excessive (or irregular timing) coffee 2) red wine 3) aged cheeses 4) chocolate 5) citrus fruits 6) aspartame and other artifical sweeteners 7) yeast 8) MSG (in processed foods) 9) processed and cured meats 10) nuts and certain seeds 11) chicken livers and other organ meats 12) dairy products like buttermilk, sour cream, and yogurt 13) dried fruits like dates, figs, and raisins 14) garlic 15) onions 16) potato chips 17) pickled foods like olives and sauerkraut 18) some fresh fruits like ripe banana, papaya, red plums, raspberries, kiwi, pineapple 19) tomato-based products  Recommend to keep a migraine diary: rate daily the severity of your headache (1-10) and what foods you eat that day to help determine patterns.    9) Hypotension: discontinued the midodrine. Recommended she check her BP daily while lying down, sitting up, and standing so we can observe for orthostatic hypotension.  10) Palpitations: discussed that could be secondary to dysautonomia which can respond to propanolol or metoprolol. Will check BP/HR first before trying these medications which can lower BP  37 minutes spent in discussing her insomnia, response to amitriptyline, discussing other medication and lifestyle interventions, discussion of her hypotension and palpitations, recommending to check her BP while flat, in seated position, and standing to assess for orthostatic hypotension vs dysautonomia, discussed plan for EGD this week and surgical consultation for labral tear

## 2021-02-09 ENCOUNTER — Encounter (HOSPITAL_BASED_OUTPATIENT_CLINIC_OR_DEPARTMENT_OTHER): Payer: Self-pay | Admitting: Physical Therapy

## 2021-02-09 ENCOUNTER — Other Ambulatory Visit: Payer: Self-pay

## 2021-02-09 ENCOUNTER — Ambulatory Visit (HOSPITAL_BASED_OUTPATIENT_CLINIC_OR_DEPARTMENT_OTHER): Payer: BC Managed Care – PPO | Attending: Orthopaedic Surgery | Admitting: Physical Therapy

## 2021-02-09 DIAGNOSIS — M6281 Muscle weakness (generalized): Secondary | ICD-10-CM | POA: Diagnosis not present

## 2021-02-09 DIAGNOSIS — M25551 Pain in right hip: Secondary | ICD-10-CM | POA: Insufficient documentation

## 2021-02-09 DIAGNOSIS — R262 Difficulty in walking, not elsewhere classified: Secondary | ICD-10-CM | POA: Insufficient documentation

## 2021-02-09 NOTE — Therapy (Signed)
OUTPATIENT PHYSICAL THERAPY LOWER EXTREMITY EVALUATION   Patient Name: Brianna [name] MRN: [id number] DOB:1995-06-19, 25 y.o., female Today's Date: 02/09/2021  PCP: Rudene Christians, DO REFERRING PROVIDER: [name], MD   PT End of Session - 02/09/21 1436     Visit Number 1    Number of Visits 5    Date for PT Re-Evaluation 03/11/21    Authorization Type [employer]    PT Start Time 1346    PT Stop Time 1430    PT Time Calculation (min) 44 min    Activity Tolerance Patient tolerated treatment well    Behavior During Therapy [facility] for tasks assessed/performed             Past Medical History:  Diagnosis Date   Acute recurrent pansinusitis 10/21/2019   Asthma    Bronchitis    Cat allergy due to both airborne and skin contact 05/01/2016   Chronic sore throat 05/01/2016   Depression    Fibromyalgia    Gastric paresis    Keratosis pilaris    Migraine    Seasonal allergic rhinitis 03/11/2014   Past Surgical History:  Procedure Laterality Date   ESOPHAGOGASTRODUODENOSCOPY ENDOSCOPY     03/2020   SMART PILL PROCEDURE     12/2020   TYMPANOSTOMY TUBE PLACEMENT Bilateral    WISDOM TOOTH EXTRACTION     Patient Active Problem List   Diagnosis Date Noted   Headache 11/30/2020   Positive ANA (antinuclear antibody) 11/15/2020   Fibromyalgia 11/02/2020   Hair loss 11/02/2020   Muscle spasticity 10/22/2020   Rash and other nonspecific skin eruption 10/22/2020   Paresthesia of bilateral legs 10/06/2020   Myalgia 10/06/2020   Chronic pelvic pain in female 10/06/2020   Chronic abdominal pain 10/06/2020   IBS (irritable bowel syndrome) 08/02/2020   Pelvic floor dysfunction 08/02/2020   Irritant dermatitis 08/02/2020   Gastroesophageal reflux disease 06/28/2020   Severe recurrent major depression without psychotic features (HCC) 06/28/2020   Polyarthralgia 06/28/2020   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 06/28/2020   Tachycardia 06/28/2020   History  of COVID-19 06/28/2020   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 deficit 06/28/2020   Recurrent major depressive disorder, in full remission (HCC) 06/28/2020   Atypical chest pain 06/22/2020   Dizziness 06/22/2020   SOB (shortness of breath) 06/22/2020   Palpitations 06/14/2020   Anxiety 05/04/2020   Internal and external hemorrhoids without complication 04/29/2020   ETD (Eustachian tube dysfunction), bilateral 10/21/2019   Acne vulgaris 03/11/2014   Mild intermittent asthma without complication 03/11/2014   Seasonal allergies 03/11/2014    ONSET DATE: a little over a year  REFERRING DIAG: Diagnosis S73.191A (ICD-10-CM) - Tear of right acetabular labrum, initial encounter   THERAPY DIAG:  Pain in right hip  Difficulty in walking, not elsewhere classified  Muscle weakness (generalized)  SUBJECTIVE:   SUBJECTIVE STATEMENT: Pt denied any assistance from aquatic or pelvic floor rehab. Lidocane injection helped for about an hour but then pain returned.  PERTINENT HISTORY: Ehlers-Danlos, fibromyalgia  PAIN:  Are you having pain? Yes VAS scale: 5-6/10 Pain location: begins in Rt groin, wraps around hip Pain orientation: Right  PAIN TYPE: pins and needles, shooting down back and sides  Aggravating factors: pretty much everything, walking or seated for a while, steps, driving Relieving factors: heat, menthol cream  PRECAUTIONS: None  WEIGHT BEARING RESTRICTIONS No  FALLS: Has patient fallen in last 6 months? No,   LIVING ENVIRONMENT: Lives with: roommate Lives in: House/apartment Stairs: Yes; two story home Has following equipment at home: none  PLOF: restaurant server  PATIENT GOALS get prepped for surgery, glut strength   OBJECTIVE:   DIAGNOSTIC  FINDINGS: MRI (right hip): There is a labral tear involving the right hip anterior superior.     COGNITION:  Overall cognitive status: Within functional limits for tasks assessed     SENSATION:  Pins and needles occasionally   MUSCLE LENGTH: General hypermobility and incr flexibiliity    LE AROM/PROM: grossly WFL  A/PROM Right 02/09/2021 Left 02/09/2021  Hip flexion    Hip abduction    Hip adduction    Hip extension    Hip internal rotation    Hip external rotation    Knee flexion    Knee extension    Ankle dorsiflexion    Ankle plantarflexion    Ankle inversion    Ankle eversion     (Blank rows = not tested)  LE MMT:  MMT Right 02/09/2021 Left 02/09/2021  Hip flexion 4-/5 5/5  Hip abduction 4-/5 5/5  Hip adduction    Hip extension 4-/5 5/5  Hip internal rotation    Hip external rotation    Knee flexion    Knee extension    Ankle dorsiflexion    Ankle plantarflexion    Ankle inversion    Ankle eversion     (Blank rows = not tested)    JOINT MOBILITY ASSESSMENT:  hypermobile   GAIT: Mild antalgia on Rt with Rt arm swing and guarded Lt arm for stability    Today's Treatment:  Hooklying ab set SLR- hand support under sacrum for post pelvic tilt Clam SL hip abd Prone hip ext Prone glut set & hold Standing on 2 in step- RLE hang & distract   PATIENT EDUCATION:  Education details: anatomy of condition, POC, HEP, exercise form/rationale Person educated: Patient Education method: Explanation, Demonstration, Tactile cues, Verbal cues, Handouts, and   Education comprehension: verbalized understanding, returned demonstration, verbal cues required, tactile cues required, and needs further education   HOME EXERCISE PROGRAM: [id number]   ASSESSMENT:  CLINICAL IMPRESSION: Patient is a 25 y.o. F who was seen today for physical therapy evaluation and treatment for Rt hip pain. Objective impairments include Abnormal gait, decreased activity tolerance,  difficulty walking, decreased strength, improper body mechanics, and pain. These impairments are limiting patient from cleaning, community activity, driving, occupation, and shopping. Personal factors including 1-2 comorbidities: Ehlers-Danlos, fibromyalgia, chronic pain  are also affecting patient's functional outcome. Patient will benefit from skilled PT to address above impairments and improve overall function. Pt presents to PT with complaints of Rt hip pain with known labral tear from MRI. She has done past episodes of PT without improvement in hip pain. Presents with significant weakness in Rt hip v Lt and denies regular exercise outside of work. We worked to create a HEP of OKC exercises to improve tolerance to exercise and build lumbopelvic strength. I do think surgery is appropriate at this time and she  reports she plans to speak with MD about this on Friday. Will f/u next week to see how exercises are going and instruct on how to use crutches on stairs.   REHAB POTENTIAL: Good  CLINICAL DECISION MAKING: Stable/uncomplicated  EVALUATION COMPLEXITY: Low   GOALS: Goals reviewed with patient? Yes    LONG TERM GOALS:   LTG Name Target Date Goal status  1 Pt will be able to demo good core control with OKC LE exercises Baseline: worked on this in Optometrist of initial HEP 10/21  INITIAL  2 Pt will demo independence in stair navigation using AD Baseline:will work on this 10/21 INITIAL  3 Pt will be independent in HEP that is appropriate to incr glut strength Baseline:will progress and establish as appropriate 10/21  INITIAL                       PLAN: PT FREQUENCY: 1-2x/week  PT DURATION: 4 weeks  PLANNED INTERVENTIONS: Therapeutic exercises, Therapeutic activity, Neuro Muscular re-education, Balance training, Gait training, Patient/Family education, Joint mobilization, Stair training, Aquatic Therapy, Dry Needling, Electrical stimulation, Moist heat, Taping, Traction, and Manual  therapy  PLAN FOR NEXT SESSION: stairs and crutches  Ryann Pauli C. Bula Cavalieri PT, DPT 02/09/21 2:52 PM

## 2021-02-10 DIAGNOSIS — K2960 Other gastritis without bleeding: Secondary | ICD-10-CM | POA: Diagnosis not present

## 2021-02-10 DIAGNOSIS — K3184 Gastroparesis: Secondary | ICD-10-CM | POA: Diagnosis not present

## 2021-02-11 ENCOUNTER — Ambulatory Visit (INDEPENDENT_AMBULATORY_CARE_PROVIDER_SITE_OTHER): Payer: BC Managed Care – PPO | Admitting: Orthopaedic Surgery

## 2021-02-11 ENCOUNTER — Ambulatory Visit (HOSPITAL_BASED_OUTPATIENT_CLINIC_OR_DEPARTMENT_OTHER): Payer: Self-pay | Admitting: Orthopaedic Surgery

## 2021-02-11 ENCOUNTER — Other Ambulatory Visit (HOSPITAL_BASED_OUTPATIENT_CLINIC_OR_DEPARTMENT_OTHER): Payer: Self-pay

## 2021-02-11 ENCOUNTER — Other Ambulatory Visit: Payer: Self-pay

## 2021-02-11 VITALS — BP 111/76 | Ht 64.0 in | Wt 101.2 lb

## 2021-02-11 DIAGNOSIS — S73191A Other sprain of right hip, initial encounter: Secondary | ICD-10-CM | POA: Diagnosis not present

## 2021-02-11 MED ORDER — ACETAMINOPHEN 500 MG PO TABS
500.0000 mg | ORAL_TABLET | Freq: Three times a day (TID) | ORAL | 0 refills | Status: AC
  Filled 2021-02-11: qty 30, 10d supply, fill #0

## 2021-02-11 MED ORDER — ASPIRIN EC 325 MG PO TBEC
325.0000 mg | DELAYED_RELEASE_TABLET | Freq: Every day | ORAL | 0 refills | Status: DC
  Filled 2021-02-11: qty 30, 30d supply, fill #0

## 2021-02-11 MED ORDER — OXYCODONE HCL 5 MG PO TABS
5.0000 mg | ORAL_TABLET | ORAL | 0 refills | Status: DC | PRN
  Filled 2021-02-11: qty 20, 4d supply, fill #0

## 2021-02-11 MED ORDER — IBUPROFEN 800 MG PO TABS
800.0000 mg | ORAL_TABLET | Freq: Three times a day (TID) | ORAL | 0 refills | Status: AC
  Filled 2021-02-11: qty 30, 10d supply, fill #0

## 2021-02-11 NOTE — Progress Notes (Signed)
Chief Complaint: Right hip pain     History of Present Illness:   Pain Score: 4/10 SANE: 20/100   02/11/2021: Brianna [name] presents today 1 week follow-up after a right ultrasound-guided lidocaine injection.  She states that she did achieve 80% pain relief 1-1/2 half hours after the injection.  Overall she states that during this period where the hip was injected she could walk without needing to constantly focus on the hip which she was very satisfied with.  She has seen physical therapy, she has started a gluteus medius and maximus strengthening program.  She states that she does feel like she is having a little bit of nerve irritation over the anterior lateral hip following this.  Brianna [name] is a 25 y.o. female with right hip pain groin pain now going on for over a year.  She states that she feels a popping and grinding in the hip as well.  There is a sensation that the hip feels like it needs to pop.  She has issues with going up and down stairs that aggravates it.  She is currently a Production assistant, radio at [employer] and as a result is having significant difficulty during the day and being on her feet.  She was previously seen by Dr. August Saucer and his reader referred her for an assessment of her right hip particularly in the context of a labral tear.  She has previously tried anti-inflammatories which are not helping.  She has not done physical therapy.  She has pain with prolonged sitting.  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    Surgical History:   None  PMH/PSH/Family History/Social History/Meds/Allergies:    Past Medical History:  Diagnosis Date   Acute recurrent pansinusitis 10/21/2019   Asthma    Bronchitis    Cat allergy due to both airborne and skin contact 05/01/2016   Chronic sore throat 05/01/2016   Depression    Fibromyalgia    Gastric paresis    Keratosis pilaris    Migraine     Seasonal allergic rhinitis 03/11/2014   Past Surgical History:  Procedure Laterality Date   ESOPHAGOGASTRODUODENOSCOPY ENDOSCOPY     03/2020   SMART PILL PROCEDURE     12/2020   TYMPANOSTOMY TUBE PLACEMENT Bilateral    WISDOM TOOTH EXTRACTION     Social History   Socioeconomic History   Marital status: Single    Spouse name: Not on file   Number of children: Not on file   Years of education: Not on file   Highest education level: Not on file  Occupational History   Not on file  Tobacco Use   Smoking status: Former    Types: E-cigarettes, Cigarettes    Quit date: 2021    Years since quitting: 1.7   Smokeless tobacco: Never  Vaping Use   Vaping Use: Former   Substances: Financial trader  Substance and Sexual Activity   Alcohol use: Not Currently   Drug use: Yes    Types: Marijuana   Sexual activity: Yes    Birth control/protection: None  Other Topics Concern   Not on file  Social History Narrative   Right handed   Social Determinants of Health   Financial Resource Strain: Not on file  Food Insecurity: Not on file  Transportation Needs: Not on file  Physical Activity: Not on file  Stress: Not on file  Social Connections: Not on file   Family History  Problem Relation Age of Onset   Diabetes Mother    Polycystic ovary syndrome Mother    GER disease Father    Heart disease Paternal Uncle    Allergies  Allergen Reactions   Clindamycin Hives, Rash and Swelling    Other reaction(s): swelling   Current Outpatient Medications  Medication Sig Dispense Refill   Acetylcysteine (NAC) 600 MG CAPS Take 1 capsule (600 mg total) by mouth 2 (two) times daily. 60 capsule 0   albuterol (VENTOLIN HFA) 108 (90 Base) MCG/ACT inhaler Inhale into the lungs.     cholecalciferol (VITAMIN D) 25 MCG (1000 UNIT) tablet Take 500 Units by mouth.     dicyclomine (BENTYL) 10 MG capsule Take 10 mg by mouth 4 (four) times daily as needed.     Ferrous Sulfate Dried (EQ SLOW-RELEASE  IRON) 45 MG TBCR Take 1 tablet by mouth every other day. Every 3 days     fluticasone (FLONASE) 50 MCG/ACT nasal spray SPRAY 1 SPRAY INTO BOTH NOSTRILS DAILY. 16 mL 1   loratadine (CLARITIN) 10 MG tablet Take 1 tablet (10 mg total) by mouth daily. 90 tablet 1   midodrine (PROAMATINE) 2.5 MG tablet Take 1 tablet (2.5 mg total) by mouth daily. (Patient not taking: Reported on 02/09/2021) 30 tablet 3   nitrofurantoin, macrocrystal-monohydrate, (MACROBID) 100 MG capsule Take 1 capsule (100 mg total) by mouth 2 (two) times daily. (Patient not taking: Reported on 02/09/2021) 10 capsule 0   nortriptyline (PAMELOR) 50 MG capsule TAKE 1 CAPSULE BY MOUTH AT BEDTIME. (Patient not taking: Reported on 02/09/2021) 90 capsule 2   ondansetron (ZOFRAN ODT) 4 MG disintegrating tablet Take 1 tablet (4 mg total) by mouth every 8 (eight) hours as needed for nausea or vomiting. 20 tablet 5   pregabalin (LYRICA) 25 MG capsule Take 25 mg by mouth in the morning and at bedtime. Patient will start taking twice daily in 1 week.     Prenatal Vit-Fe Fumarate-FA (PRENATAL PO) Take by mouth daily. (Patient not taking: Reported on 02/09/2021)     Prucalopride Succinate 2 MG TABS Take by mouth.     rizatriptan (MAXALT-MLT) 10 MG disintegrating tablet Take 1 tablet earliest onset of migraine.  May repeat in 2 hours.  Maximum 2 tablets in 24 hours. (Patient not taking: Reported on 02/09/2021) 10 tablet 5   No current facility-administered medications for this visit.   No results found.  Review of Systems:   A ROS was performed including pertinent positives and negatives as documented in the HPI.  Physical Exam :   Constitutional: NAD and appears stated age Neurological: Alert and oriented Psych: Appropriate affect and cooperative Blood pressure 111/76, height 5\' 4"  (1.626 m), weight 101 lb 3.2 oz (45.9 kg).   Comprehensive Musculoskeletal Exam:    Inspection Right Left  Skin No atrophy or gross abnormalities appreciated No  atrophy or gross abnormalities appreciated  Palpation    Tenderness None None  Crepitus None None  Range of Motion    Flexion (passive) 120 120  Extension 30 30  IR 40 with pain 30  ER 40 40  Strength    Flexion  5/5 5/5  Extension 5/5 5/5  Special Tests    FABIR Negative Negative  FADER Negative Negative  ER Lag/Capsular Insufficiency Negative Negative  Instability Negative Negative  Sacroiliac pain Negative  Negative   Instability    Generalized Laxity No No  Neurologic    sciatic, femoral, obturator nerves intact to light sensation  Vascular/Lymphatic    DP pulse 2+ 2+  Lumbar Exam    Patient has symmetric lumbar range of motion with negative pain referral to hip     Imaging:   Xray (right hip and AP pelvis): There is a crossover sign with  MRI (right hip): There is a labral tear involving the right hip anterior superior.  I personally reviewed and interpreted the radiographs.   Assessment:   25 year old female with right hip pain in the setting of a known labral tear.  She does have evidence of cephalad retroversion which I have discussed as a potential contributor to her issue.  We did discuss that this is in the setting of ligamentous laxity this can also exacerbate right hip issues.  Regardless I think that she would be a candidate for physical therapy for gluteal strengthening in order to improve her dynamic stabilizers.  She has started physical therapy with a gluteal strengthening program.  Given her 80% pain relief from the right hip injection, I have advised that it is unlikely that she will get 100% pain relief from surgical intervention although she does have a labral tear and evidence of impingement that I believe would be best mitigated with a combination of surgical repair and physical therapy.  At this time she has failed greater than 6 weeks of conservative management.  Plan :    -Plan for right hip arthroscopy with labral debridement, cam and pincer  debridement -Patient is already scheduled with physical therapy -Postop prescriptions ordered and sent to pharmacy downstairs   After a lengthy discussion of treatment options, including risks, benefits, alternatives, complications of surgical and nonsurgical conservative options, the patient elected surgical repair.   The patient  is aware of the material risks  and complications including, but not limited to injury to adjacent structures, neurovascular injury, infection, numbness, bleeding, implant failure, thermal burns, stiffness, persistent pain, failure to heal, disease transmission from allograft, need for further surgery, dislocation, anesthetic risks, blood clots, risks of death,and others. The probabilities of surgical success and failure discussed with patient given their particular co-morbidities.The time and nature of expected rehabilitation and recovery was discussed.The patient's questions were all answered preoperatively.  No barriers to understanding were noted. I explained the natural history of the disease process and Rx rationale.  I explained to the patient what I considered to be reasonable expectations given their personal situation.  The final treatment plan was arrived at through a shared patient decision making process model.   Patient was prescribed a hinged hip brace for the diagnosis listed above under assessment. The patient is ambulatory but has weakness and / or instability of their hip which requires stabilization from this semi-rigid / rigid orthosis to improve their function.   Patient was prescribed crutches for the diagnosis listed above under assessment. This mobility device is required for the following reasons:   1. The patient has a mobility limitation that significantly impairs their ability to participate in one or more mobility-related activities of daily living (MRADL) in the home; and  2. The patient is able to safely use the mobility device; and  3.  The functional mobility deficit can be sufficiently resolved with use of the mobility device.      I personally saw and evaluated the patient, and participated in the management and treatment plan.  [name], MD  Attending Physician, Orthopedic Surgery  This document was dictated using Dragon voice recognition software. A reasonable attempt at proof reading has been made to minimize errors.

## 2021-02-11 NOTE — H&P (View-Only) (Signed)
                               Chief Complaint: Right hip pain     History of Present Illness:   Pain Score: 4/10 SANE: 20/100   02/11/2021: Brianna [name] presents today 1 week follow-up after a right ultrasound-guided lidocaine injection.  She states that she did achieve 80% pain relief 1-1/2 half hours after the injection.  Overall she states that during this period where the hip was injected she could walk without needing to constantly focus on the hip which she was very satisfied with.  She has seen physical therapy, she has started a gluteus medius and maximus strengthening program.  She states that she does feel like she is having a little bit of nerve irritation over the anterior lateral hip following this.  Brianna [name] is a 25 y.o. female with right hip pain groin pain now going on for over a year.  She states that she feels a popping and grinding in the hip as well.  There is a sensation that the hip feels like it needs to pop.  She has issues with going up and down stairs that aggravates it.  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  She was previously seen by Dr. Dean and his reader referred her for an assessment of her right hip particularly in the context of a labral tear.  She has previously tried anti-inflammatories which are not helping.  She has not done physical therapy.  She has pain with prolonged sitting.  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    Surgical History:   None  PMH/PSH/Family History/Social History/Meds/Allergies:    Past Medical History:  Diagnosis Date   Acute recurrent pansinusitis 10/21/2019   Asthma    Bronchitis    Cat allergy due to both airborne and skin contact 05/01/2016   Chronic sore throat 05/01/2016   Depression    Fibromyalgia    Gastric paresis    Keratosis pilaris    Migraine     Seasonal allergic rhinitis 03/11/2014   Past Surgical History:  Procedure Laterality Date   ESOPHAGOGASTRODUODENOSCOPY ENDOSCOPY     03/2020   SMART PILL PROCEDURE     12/2020   TYMPANOSTOMY TUBE PLACEMENT Bilateral    WISDOM TOOTH EXTRACTION     Social History   Socioeconomic History   Marital status: Single    Spouse name: Not on file   Number of children: Not on file   Years of education: Not on file   Highest education level: Not on file  Occupational History   Not on file  Tobacco Use   Smoking status: Former    Types: E-cigarettes, Cigarettes    Quit date: 2021    Years since quitting: 1.7   Smokeless tobacco: Never  Vaping Use   Vaping Use: Former   Substances: Synthetic cannabinoids  Substance and Sexual Activity   Alcohol use: Not Currently   Drug use: Yes    Types: Marijuana   Sexual activity: Yes    Birth control/protection: None  Other Topics Concern   Not on file  Social History Narrative   Right handed   Social Determinants of Health   Financial Resource Strain: Not on file  Food Insecurity: Not on file    Transportation Needs: Not on file  Physical Activity: Not on file  Stress: Not on file  Social Connections: Not on file   Family History  Problem Relation Age of Onset   Diabetes Mother    Polycystic ovary syndrome Mother    GER disease Father    Heart disease Paternal Uncle    Allergies  Allergen Reactions   Clindamycin Hives, Rash and Swelling    Other reaction(s): swelling   Current Outpatient Medications  Medication Sig Dispense Refill   Acetylcysteine (NAC) 600 MG CAPS Take 1 capsule (600 mg total) by mouth 2 (two) times daily. 60 capsule 0   albuterol (VENTOLIN HFA) 108 (90 Base) MCG/ACT inhaler Inhale into the lungs.     cholecalciferol (VITAMIN D) 25 MCG (1000 UNIT) tablet Take 500 Units by mouth.     dicyclomine (BENTYL) 10 MG capsule Take 10 mg by mouth 4 (four) times daily as needed.     Ferrous Sulfate Dried (EQ SLOW-RELEASE  IRON) 45 MG TBCR Take 1 tablet by mouth every other day. Every 3 days     fluticasone (FLONASE) 50 MCG/ACT nasal spray SPRAY 1 SPRAY INTO BOTH NOSTRILS DAILY. 16 mL 1   loratadine (CLARITIN) 10 MG tablet Take 1 tablet (10 mg total) by mouth daily. 90 tablet 1   midodrine (PROAMATINE) 2.5 MG tablet Take 1 tablet (2.5 mg total) by mouth daily. (Patient not taking: Reported on 02/09/2021) 30 tablet 3   nitrofurantoin, macrocrystal-monohydrate, (MACROBID) 100 MG capsule Take 1 capsule (100 mg total) by mouth 2 (two) times daily. (Patient not taking: Reported on 02/09/2021) 10 capsule 0   nortriptyline (PAMELOR) 50 MG capsule TAKE 1 CAPSULE BY MOUTH AT BEDTIME. (Patient not taking: Reported on 02/09/2021) 90 capsule 2   ondansetron (ZOFRAN ODT) 4 MG disintegrating tablet Take 1 tablet (4 mg total) by mouth every 8 (eight) hours as needed for nausea or vomiting. 20 tablet 5   pregabalin (LYRICA) 25 MG capsule Take 25 mg by mouth in the morning and at bedtime. Patient will start taking twice daily in 1 week.     Prenatal Vit-Fe Fumarate-FA (PRENATAL PO) Take by mouth daily. (Patient not taking: Reported on 02/09/2021)     Prucalopride Succinate 2 MG TABS Take by mouth.     rizatriptan (MAXALT-MLT) 10 MG disintegrating tablet Take 1 tablet earliest onset of migraine.  May repeat in 2 hours.  Maximum 2 tablets in 24 hours. (Patient not taking: Reported on 02/09/2021) 10 tablet 5   No current facility-administered medications for this visit.   No results found.  Review of Systems:   A ROS was performed including pertinent positives and negatives as documented in the HPI.  Physical Exam :   Constitutional: NAD and appears stated age Neurological: Alert and oriented Psych: Appropriate affect and cooperative Blood pressure 111/76, height 5\' 4"  (1.626 m), weight 101 lb 3.2 oz (45.9 kg).   Comprehensive Musculoskeletal Exam:    Inspection Right Left  Skin No atrophy or gross abnormalities appreciated No  atrophy or gross abnormalities appreciated  Palpation    Tenderness None None  Crepitus None None  Range of Motion    Flexion (passive) 120 120  Extension 30 30  IR 40 with pain 30  ER 40 40  Strength    Flexion  5/5 5/5  Extension 5/5 5/5  Special Tests    FABIR Negative Negative  FADER Negative Negative  ER Lag/Capsular Insufficiency Negative Negative  Instability Negative Negative  Sacroiliac pain Negative  Negative   Instability    Generalized Laxity No No  Neurologic    sciatic, femoral, obturator nerves intact to light sensation  Vascular/Lymphatic    DP pulse 2+ 2+  Lumbar Exam    Patient has symmetric lumbar range of motion with negative pain referral to hip     Imaging:   Xray (right hip and AP pelvis): There is a crossover sign with  MRI (right hip): There is a labral tear involving the right hip anterior superior.  I personally reviewed and interpreted the radiographs.   Assessment:   25 year old female with right hip pain in the setting of a known labral tear.  She does have evidence of cephalad retroversion which I have discussed as a potential contributor to her issue.  We did discuss that this is in the setting of ligamentous laxity this can also exacerbate right hip issues.  Regardless I think that she would be a candidate for physical therapy for gluteal strengthening in order to improve her dynamic stabilizers.  She has started physical therapy with a gluteal strengthening program.  Given her 80% pain relief from the right hip injection, I have advised that it is unlikely that she will get 100% pain relief from surgical intervention although she does have a labral tear and evidence of impingement that I believe would be best mitigated with a combination of surgical repair and physical therapy.  At this time she has failed greater than 6 weeks of conservative management.  Plan :    -Plan for right hip arthroscopy with labral debridement, cam and pincer  debridement -Patient is already scheduled with physical therapy -Postop prescriptions ordered and sent to pharmacy downstairs   After a lengthy discussion of treatment options, including risks, benefits, alternatives, complications of surgical and nonsurgical conservative options, the patient elected surgical repair.   The patient  is aware of the material risks  and complications including, but not limited to injury to adjacent structures, neurovascular injury, infection, numbness, bleeding, implant failure, thermal burns, stiffness, persistent pain, failure to heal, disease transmission from allograft, need for further surgery, dislocation, anesthetic risks, blood clots, risks of death,and others. The probabilities of surgical success and failure discussed with patient given their particular co-morbidities.The time and nature of expected rehabilitation and recovery was discussed.The patient's questions were all answered preoperatively.  No barriers to understanding were noted. I explained the natural history of the disease process and Rx rationale.  I explained to the patient what I considered to be reasonable expectations given their personal situation.  The final treatment plan was arrived at through a shared patient decision making process model.   Patient was prescribed a hinged hip brace for the diagnosis listed above under assessment. The patient is ambulatory but has weakness and / or instability of their hip which requires stabilization from this semi-rigid / rigid orthosis to improve their function.   Patient was prescribed crutches for the diagnosis listed above under assessment. This mobility device is required for the following reasons:   1. The patient has a mobility limitation that significantly impairs their ability to participate in one or more mobility-related activities of daily living (MRADL) in the home; and  2. The patient is able to safely use the mobility device; and  3.  The functional mobility deficit can be sufficiently resolved with use of the mobility device.      I personally saw and evaluated the patient, and participated in the management and treatment plan.  [name], MD  Attending Physician, Orthopedic Surgery  This document was dictated using Dragon voice recognition software. A reasonable attempt at proof reading has been made to minimize errors.

## 2021-02-13 ENCOUNTER — Ambulatory Visit
Admission: RE | Admit: 2021-02-13 | Discharge: 2021-02-13 | Disposition: A | Discharge status: Home / Self Care | Payer: BC Managed Care – PPO | Source: Ambulatory Visit | Attending: Orthopedic Surgery | Admitting: Orthopedic Surgery

## 2021-02-13 ENCOUNTER — Other Ambulatory Visit: Payer: Self-pay

## 2021-02-13 DIAGNOSIS — M545 Low back pain, unspecified: Secondary | ICD-10-CM

## 2021-02-13 DIAGNOSIS — M5416 Radiculopathy, lumbar region: Secondary | ICD-10-CM | POA: Diagnosis not present

## 2021-02-14 ENCOUNTER — Ambulatory Visit (INDEPENDENT_AMBULATORY_CARE_PROVIDER_SITE_OTHER): Payer: BC Managed Care – PPO | Admitting: Internal Medicine

## 2021-02-14 ENCOUNTER — Other Ambulatory Visit: Payer: Self-pay

## 2021-02-14 ENCOUNTER — Encounter (HOSPITAL_BASED_OUTPATIENT_CLINIC_OR_DEPARTMENT_OTHER): Payer: Self-pay | Admitting: Orthopaedic Surgery

## 2021-02-14 ENCOUNTER — Encounter: Payer: Self-pay | Admitting: Internal Medicine

## 2021-02-14 VITALS — BP 105/72 | HR 69 | Temp 98.2°F | Resp 28 | Ht 64.0 in | Wt 102.7 lb

## 2021-02-14 DIAGNOSIS — M797 Fibromyalgia: Secondary | ICD-10-CM | POA: Diagnosis not present

## 2021-02-14 DIAGNOSIS — L659 Nonscarring hair loss, unspecified: Secondary | ICD-10-CM

## 2021-02-14 DIAGNOSIS — G8929 Other chronic pain: Secondary | ICD-10-CM | POA: Diagnosis not present

## 2021-02-14 DIAGNOSIS — R200 Anesthesia of skin: Secondary | ICD-10-CM | POA: Diagnosis not present

## 2021-02-14 DIAGNOSIS — R109 Unspecified abdominal pain: Secondary | ICD-10-CM

## 2021-02-14 DIAGNOSIS — R202 Paresthesia of skin: Secondary | ICD-10-CM | POA: Diagnosis not present

## 2021-02-14 NOTE — Patient Instructions (Addendum)
[name], it was a pleasure seeing you today!  Today we discussed: You stated you were doing okay but still didn't have any improvements in your symptoms. You were still having hair loss, abdominal pain, and joint pains. I advise continuing following with dermatology, gastroenterology, and rheumatology. You are scheduled to follow with Psychiatry in October. Please keep that appointment if possible as stress and anxiety can exacerbate all these conditions. I wish you well in your road to recovery!  I have ordered the following labs today:  Lab Orders  No laboratory test(s) ordered today      Referrals ordered today:   Referral Orders         Ambulatory referral to Dermatology       I have ordered the following medication/changed the following medications:   Stop the following medications: There are no discontinued medications.   Start the following medications: No orders of the defined types were placed in this encounter.    Follow-up: Next available with Dr. Sloan Leiter  Please make sure to arrive 15 minutes prior to your next appointment. If you arrive late, you may be asked to reschedule.   We look forward to seeing you next time. Please call our clinic at [phone] if you have any questions or concerns. The best time to call is Monday-Friday from 9am-4pm, but there is someone available 24/7. If after hours or the weekend, call the main hospital number and ask for the Internal Medicine Resident On-Call. If you need medication refills, please notify your pharmacy one week in advance and they will send [place] a request.  Thank you for letting us take part in your care. Wishing you the best!  Thank you, Gwenevere Abbot, MD

## 2021-02-14 NOTE — Progress Notes (Signed)
Mri ok pls clala thx

## 2021-02-15 ENCOUNTER — Telehealth: Payer: Self-pay | Admitting: Orthopedic Surgery

## 2021-02-15 NOTE — Telephone Encounter (Signed)
I called pt to set MRI review appt and she states she spoke with someone and was told it was normal; and she had been referred to Dr.Bokshan. Pt asked for a CB to confirm she didn't need to see Dr.Dean again.   [phone]

## 2021-02-16 ENCOUNTER — Telehealth: Payer: Self-pay | Admitting: Orthopedic Surgery

## 2021-02-16 NOTE — Telephone Encounter (Signed)
I spoke with Brianna [name] regarding her hip surgery. She is scheduled for 03/02/21 at [facility] OR.  She stated that she is unsure of when she is supposed to obtain there "custom brace".  Does she do this before surgery or after and where does she go?  Please call her at [phone].

## 2021-02-17 ENCOUNTER — Telehealth: Payer: Self-pay | Admitting: Orthopaedic Surgery

## 2021-02-17 ENCOUNTER — Other Ambulatory Visit (HOSPITAL_BASED_OUTPATIENT_CLINIC_OR_DEPARTMENT_OTHER): Payer: Self-pay

## 2021-02-17 NOTE — Telephone Encounter (Signed)
Pt states she's having surgery and then a week later has to have an appt that's a little ways out. The pt would like a CB to make sure it will be fine as long as she has a driver.   [phone]

## 2021-02-19 NOTE — Assessment & Plan Note (Signed)
Assessment: Patient continued to have joint pains and is following with rheumatologist.  I listened to her and acknowledged her struggle with this.  When I told her that the dose of her Lyrica is very low and we can go up on that she stated she is only taking it once a day instead of twice a day.  She stated she does not like to overmedicate that is necessary.  I advised continuing to follow rheumatology.  She has an appointment with psychiatry coming up and advised her to keep that appointment stress and anxiety can exacerbate fibromyalgia.  Plan: -Continue following rheumatology -Follow with psychiatry

## 2021-02-19 NOTE — Assessment & Plan Note (Signed)
Assessment: Patient complained of hair loss and stated that her dermatologist was not doing enough for her hair loss.  She stated that she wanted a new dermatologist specifically at [facility] dermatology because she is seeing all of her other specialist from [facility].  When inquired about the hair loss she states she is losing about 100 hairs a day and is losing more if she is taking a shower.  When asked what she took for the hair loss she stated the dermatologist recommended Nutrafol but she has not taken that.  Plan: - Referral to dermatology placed - Advised trying the Nutrafol - Informed her about her loss and that losing 100 g a day is normal and is exacerbated by stress

## 2021-02-19 NOTE — Progress Notes (Signed)
   CC: follow up  HPI:  [name] is a 25 y.o. with medical history as below presenting to [facility] for follow up  Please see problem-based list for further details, assessments, and plans.  Past Medical History:  Diagnosis Date   Acute recurrent pansinusitis 10/21/2019   Asthma    Bronchitis    Cat allergy due to both airborne and skin contact 05/01/2016   Chronic sore throat 05/01/2016   Depression    Fibromyalgia    Gastric paresis    Keratosis pilaris    Migraine    Seasonal allergic rhinitis 03/11/2014   Review of Systems:  Review of system negative unless stated in the problem list or HPI.    Physical Exam:  Vitals:   02/14/21 1025  BP: 105/72  Pulse: 69  Resp: (!) 28  Temp: 98.2 F (36.8 C)  TempSrc: Oral  SpO2: 100%  Weight: 102 lb 11.2 oz (46.6 kg)  Height: 5\' 4"  (1.626 m)    Physical Exam Constitutional:      General: She is not in acute distress.    Appearance: Normal appearance. She is not ill-appearing.  HENT:     Head: Normocephalic and atraumatic.     Right Ear: External ear normal.     Left Ear: External ear normal.     Nose: Nose normal.     Mouth/Throat:     Mouth: Mucous membranes are moist.     Pharynx: Oropharynx is clear.  Eyes:     Extraocular Movements: Extraocular movements intact.     Conjunctiva/sclera: Conjunctivae normal.     Pupils: Pupils are equal, round, and reactive to light.  Cardiovascular:     Rate and Rhythm: Normal rate and regular rhythm.     Pulses: Normal pulses.     Heart sounds: Normal heart sounds. No murmur heard.   No friction rub. No gallop.  Pulmonary:     Effort: Pulmonary effort is normal. No respiratory distress.     Breath sounds: Normal breath sounds. No stridor. No wheezing or rhonchi.  Abdominal:     General: Bowel sounds are normal. There is no distension.     Palpations: Abdomen is soft. There is no mass.  Musculoskeletal:        General: No swelling or tenderness. Normal range of  motion.     Cervical back: Normal range of motion and neck supple.  Skin:    Capillary Refill: Capillary refill takes less than 2 seconds.     Coloration: Skin is not jaundiced.     Findings: No erythema, lesion or rash.  Neurological:     General: No focal deficit present.     Mental Status: She is alert and oriented to person, place, and time. Mental status is at baseline.  Psychiatric:        Mood and Affect: Mood normal.        Behavior: Behavior normal.    Assessment & Plan:   See Encounters Tab for problem based charting.  Patient seen with Dr. , MD

## 2021-02-19 NOTE — Assessment & Plan Note (Addendum)
Assessment: Patient has a history of chronic abdominal pain and is followed by [facility].  Her tests have been coming back negative for any acute finding but she was recently diagnosed with having gastroparesis with positive delayed emptying study.  Patient endorses abdominal pain at times and loss of appetite.  She states she has been losing weight and is considering tube feedings.  I attentively listen to her and acknowledged the difficulties she was having with her health.  I advised continued follow-up with GI.  Advised we can work on her anxiety as anxiety and stress can exacerbate these conditions.  She stated she had an appointment coming with psychiatry and will follow up with them regarding this.   Plan: - Please continue following GI - Follow-up with psychiatry

## 2021-02-21 ENCOUNTER — Ambulatory Visit (INDEPENDENT_AMBULATORY_CARE_PROVIDER_SITE_OTHER): Payer: BC Managed Care – PPO | Admitting: Family Medicine

## 2021-02-21 ENCOUNTER — Ambulatory Visit (INDEPENDENT_AMBULATORY_CARE_PROVIDER_SITE_OTHER): Payer: BC Managed Care – PPO | Admitting: Obstetrics and Gynecology

## 2021-02-21 ENCOUNTER — Encounter: Payer: Self-pay | Admitting: Obstetrics and Gynecology

## 2021-02-21 ENCOUNTER — Other Ambulatory Visit: Payer: Self-pay

## 2021-02-21 ENCOUNTER — Ambulatory Visit: Payer: BC Managed Care – PPO | Admitting: Behavioral Health

## 2021-02-21 ENCOUNTER — Other Ambulatory Visit (HOSPITAL_COMMUNITY)
Admission: RE | Admit: 2021-02-21 | Discharge: 2021-02-21 | Disposition: A | Discharge status: Home / Self Care | Payer: BC Managed Care – PPO | Source: Ambulatory Visit | Attending: Obstetrics and Gynecology | Admitting: Obstetrics and Gynecology

## 2021-02-21 VITALS — BP 106/66 | HR 74 | Resp 16 | Ht 64.0 in | Wt 104.0 lb

## 2021-02-21 DIAGNOSIS — N898 Other specified noninflammatory disorders of vagina: Secondary | ICD-10-CM | POA: Diagnosis not present

## 2021-02-21 DIAGNOSIS — Z23 Encounter for immunization: Secondary | ICD-10-CM

## 2021-02-21 DIAGNOSIS — F419 Anxiety disorder, unspecified: Secondary | ICD-10-CM

## 2021-02-21 DIAGNOSIS — N949 Unspecified condition associated with female genital organs and menstrual cycle: Secondary | ICD-10-CM | POA: Diagnosis not present

## 2021-02-21 DIAGNOSIS — K3184 Gastroparesis: Secondary | ICD-10-CM | POA: Diagnosis not present

## 2021-02-21 DIAGNOSIS — F331 Major depressive disorder, recurrent, moderate: Secondary | ICD-10-CM

## 2021-02-21 LAB — POCT URINALYSIS DIPSTICK
Bilirubin, UA: NEGATIVE
Blood, UA: NEGATIVE
Glucose, UA: NEGATIVE
Ketones, UA: NEGATIVE
Leukocytes, UA: NEGATIVE
Nitrite, UA: NEGATIVE
Protein, UA: NEGATIVE
Spec Grav, UA: 1.015 (ref 1.010–1.025)
Urobilinogen, UA: 0.2 E.U./dL
pH, UA: 5 (ref 5.0–8.0)

## 2021-02-21 NOTE — Patient Instructions (Addendum)
Goals remain essentially the same (changes in bold print below): 1. Eat at least 6 times daily, with your first meal within the first hour of being up. In addition to smoothies, incorporate liquid calories at least once daily, e.g.,8 oz tart cherry juice (try 4 oz to begin).   2. Continue including sources of high-quality fat (~1 teaspoon) at least 3 X day to maximize energy intake: olive oil, canola oil, avocado, nut butters, fatty fish such as salmon, tuna, mackerel.   3. Add some ground flax seed to to your smoothie daily.  (Store in refrigerator.)  Increase to 1 1/2 teaspoons, ultimately to a max does of 1 tablespoon.  You should not increase faster than 1/2 teaspoon per week, and not progress to a full tablespoon before 4 weeks.    Tart Cherries: Look for [employer]; concentrate will be the most economical source.  Dose to aim for is the equivalent of 90-100 cherries/day, based on anti-inflammatory effects in marathon runners. Adding cherry juice concentrate, if tolerated may be helpful for its anti-inflammatory effects, and as a source of calories and melatonin.    Talk with Dr. Alycia Rossetti about the following:  What is the follow-up for the decreased gallbladder ejection fraction? Can you order a Nutrition-focused physical exam by a (hospital) registered dietitian to evaluate for malnutrition?  Review the list of foods recommended to include/avoid from Dr. Tiffany Kocher on 02/08/21.    - As you feel able, try the strategy previously discussed for dealing with negative thoughts, which may be helpful for anxiety:    When you are struggling, ask yourself these "decoding" questions:    1. What am I feeling right now?    2. What do I want to feel?    3. What do I truly need right now?    4. What are some things I can do to help me meet my need(s)? Use the Feelings and Needs lists, and WRITE your answers.   You are looking for feelings, not thoughts.  I suggest you discuss your answers with your  therapist.  Dealing with negative thoughts can be a first step toward managing the feelings those thoughts prompt.    Follow-up video visit on Monday, Nov 7 at 11 AM.

## 2021-02-21 NOTE — Progress Notes (Signed)
GYNECOLOGY OFFICE NOTE  History:  25 y.o. G1P0010 here today for vaginal itching x 1 week. Some discharge but not significantly more than usual. Has had new sexual partner recently. No other symptoms.     Past Medical History:  Diagnosis Date   Acute recurrent pansinusitis 10/21/2019   Asthma    Bronchitis    Cat allergy due to both airborne and skin contact 05/01/2016   Chronic sore throat 05/01/2016   Depression    Fibromyalgia    Gastric paresis    Keratosis pilaris    Migraine    Seasonal allergic rhinitis 03/11/2014    Past Surgical History:  Procedure Laterality Date   ESOPHAGOGASTRODUODENOSCOPY ENDOSCOPY     03/2020   SMART PILL PROCEDURE     12/2020   TYMPANOSTOMY TUBE PLACEMENT Bilateral    WISDOM TOOTH EXTRACTION       Current Outpatient Medications:    dicyclomine (BENTYL) 10 MG capsule, Take 10 mg by mouth 4 (four) times daily as needed., Disp: , Rfl:    Ferrous Sulfate Dried (EQ SLOW-RELEASE IRON) 45 MG TBCR, Take 1 tablet by mouth every other day. Every 3 days, Disp: , Rfl:    fluticasone (FLONASE) 50 MCG/ACT nasal spray, SPRAY 1 SPRAY INTO BOTH NOSTRILS DAILY., Disp: 16 mL, Rfl: 1   ibuprofen (ADVIL) 800 MG tablet, Take 1 tablet (800 mg total) by mouth every 8 (eight) hours for 10 days. Please take with food, please alternate with acetaminophen, Disp: 30 tablet, Rfl: 0   loratadine (CLARITIN) 10 MG tablet, Take 1 tablet (10 mg total) by mouth daily., Disp: 90 tablet, Rfl: 1   ondansetron (ZOFRAN ODT) 4 MG disintegrating tablet, Take 1 tablet (4 mg total) by mouth every 8 (eight) hours as needed for nausea or vomiting., Disp: 20 tablet, Rfl: 5   pregabalin (LYRICA) 25 MG capsule, Take 25 mg by mouth in the morning and at bedtime. Patient will start taking twice daily in 1 week., Disp: , Rfl:    Prucalopride Succinate 2 MG TABS, Take by mouth., Disp: , Rfl:    acetaminophen (TYLENOL) 500 MG tablet, Take 1 tablet (500 mg total) by mouth every 8 (eight) hours for  10 days., Disp: 30 tablet, Rfl: 0   Acetylcysteine (NAC) 600 MG CAPS, Take 1 capsule (600 mg total) by mouth 2 (two) times daily., Disp: 60 capsule, Rfl: 0   albuterol (VENTOLIN HFA) 108 (90 Base) MCG/ACT inhaler, Inhale into the lungs., Disp: , Rfl:    aspirin EC 325 MG tablet, Take 1 tablet (325 mg total) by mouth daily., Disp: 30 tablet, Rfl: 0   cholecalciferol (VITAMIN D) 25 MCG (1000 UNIT) tablet, Take 500 Units by mouth., Disp: , Rfl:   The following portions of the patient's history were reviewed and updated as appropriate: allergies, current medications, past family history, past medical history, past social history, past surgical history and problem list.   Review of Systems:  Pertinent items noted in HPI and remainder of comprehensive ROS otherwise negative.   Objective:  Physical Exam BP 106/66   Pulse 74   Resp 16   Ht 5\' 4"  (1.626 m)   Wt 104 lb (47.2 kg)   LMP 01/28/2021   BMI 17.85 kg/m  CONSTITUTIONAL: Well-developed, well-nourished female in no acute distress.  HENT:  Normocephalic, atraumatic. External right and left ear normal. Oropharynx is clear and moist EYES: Conjunctivae and EOM are normal. Pupils are equal, round, and reactive to light. No scleral icterus.  NECK:  Normal range of motion, supple, no masses SKIN: Skin is warm and dry. No rash noted. Not diaphoretic. No erythema. No pallor. NEUROLOGIC: Alert and oriented to person, place, and time. Normal reflexes, muscle tone coordination. No cranial nerve deficit noted. PSYCHIATRIC: Normal mood and affect. Normal behavior. Normal judgment and thought content. CARDIOVASCULAR: Normal heart rate noted RESPIRATORY: Effort normal, no problems with respiration noted ABDOMEN: Soft, no distention noted.   PELVIC: Normal appearing external genitalia; normal appearing vaginal mucosa and cervix.  Moderate white discharge noted.  pelvic cultures obtained. MUSCULOSKELETAL: Normal range of motion. No edema noted.  Exam done  with chaperone present.  Labs and Imaging  Assessment & Plan:  1. Vaginal burning - POCT Urinalysis Dipstick - Culture, OB Urine - Cervicovaginal ancillary only( [place])  2. Vaginal itching Check STI panel Reviewed vulvar hygiene - Cervicovaginal ancillary only( [place])   Routine preventative health maintenance measures emphasized. Please refer to After Visit Summary for other counseling recommendations.   Return if symptoms worsen or fail to improve.    Baldemar Lenis, MD, [facility] Attending Center for [employer] [facility])
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
## 2021-02-22 LAB — CERVICOVAGINAL ANCILLARY ONLY
Bacterial Vaginitis (gardnerella): NEGATIVE
Candida Glabrata: NEGATIVE
Candida Vaginitis: POSITIVE — AB
Chlamydia: NEGATIVE
Comment: NEGATIVE
Comment: NEGATIVE
Comment: NEGATIVE
Comment: NEGATIVE
Comment: NEGATIVE
Comment: NORMAL
Neisseria Gonorrhea: NEGATIVE
Trichomonas: NEGATIVE

## 2021-02-23 ENCOUNTER — Telehealth: Payer: Self-pay

## 2021-02-23 ENCOUNTER — Other Ambulatory Visit (HOSPITAL_BASED_OUTPATIENT_CLINIC_OR_DEPARTMENT_OTHER): Payer: Self-pay | Admitting: Orthopaedic Surgery

## 2021-02-23 DIAGNOSIS — S73191A Other sprain of right hip, initial encounter: Secondary | ICD-10-CM

## 2021-02-23 DIAGNOSIS — B379 Candidiasis, unspecified: Secondary | ICD-10-CM

## 2021-02-23 LAB — URINE CULTURE, OB REFLEX

## 2021-02-23 LAB — CULTURE, OB URINE

## 2021-02-23 MED ORDER — FLUCONAZOLE 150 MG PO TABS: 150.0000 mg | ORAL_TABLET | Freq: Once | ORAL | 0 refills | Status: AC

## 2021-02-23 NOTE — Telephone Encounter (Signed)
Pt called stating that she saw results on MyChart showing positive yeast infection. Rx for Diflucan sent per protocol. Pt is aware to pick up Rx.

## 2021-02-24 ENCOUNTER — Encounter (HOSPITAL_BASED_OUTPATIENT_CLINIC_OR_DEPARTMENT_OTHER): Payer: Self-pay | Admitting: Physical Therapy

## 2021-02-24 ENCOUNTER — Other Ambulatory Visit: Payer: Self-pay

## 2021-02-24 ENCOUNTER — Ambulatory Visit (HOSPITAL_BASED_OUTPATIENT_CLINIC_OR_DEPARTMENT_OTHER): Payer: BC Managed Care – PPO | Attending: Orthopaedic Surgery | Admitting: Physical Therapy

## 2021-02-24 DIAGNOSIS — M25551 Pain in right hip: Secondary | ICD-10-CM | POA: Insufficient documentation

## 2021-02-24 DIAGNOSIS — M6281 Muscle weakness (generalized): Secondary | ICD-10-CM | POA: Diagnosis not present

## 2021-02-24 DIAGNOSIS — S73191A Other sprain of right hip, initial encounter: Secondary | ICD-10-CM | POA: Insufficient documentation

## 2021-02-24 DIAGNOSIS — R262 Difficulty in walking, not elsewhere classified: Secondary | ICD-10-CM | POA: Insufficient documentation

## 2021-02-24 NOTE — Therapy (Signed)
OUTPATIENT PHYSICAL THERAPY TREATMENT NOTE   Patient Name: Brianna [name] MRN: [id number] DOB:1996-05-07, 25 y.o., female Today's Date: 02/24/2021  PCP: Rudene Christians, DO REFERRING PROVIDER: [name], MD   PT End of Session - 02/24/21 1231     Visit Number 2    Number of Visits 5    Date for PT Re-Evaluation 03/11/21    Authorization Type BCBS    PT Start Time 1229    PT Stop Time 1307    PT Time Calculation (min) 38 min    Activity Tolerance Patient tolerated treatment well    Behavior During Therapy [facility] for tasks assessed/performed             Past Medical History:  Diagnosis Date   Acute recurrent pansinusitis 10/21/2019   Asthma    Bronchitis    Cat allergy due to both airborne and skin contact 05/01/2016   Chronic sore throat 05/01/2016   Depression    Fibromyalgia    Gastric paresis    Keratosis pilaris    Migraine    Seasonal allergic rhinitis 03/11/2014   Past Surgical History:  Procedure Laterality Date   ESOPHAGOGASTRODUODENOSCOPY ENDOSCOPY     03/2020   SMART PILL PROCEDURE     12/2020   TYMPANOSTOMY TUBE PLACEMENT Bilateral    WISDOM TOOTH EXTRACTION     Patient Active Problem List   Diagnosis Date Noted   Headache 11/30/2020   Positive ANA (antinuclear antibody) 11/15/2020   Fibromyalgia 11/02/2020   Hair loss 11/02/2020   Muscle spasticity 10/22/2020   Rash and other nonspecific skin eruption 10/22/2020   Paresthesia of bilateral legs 10/06/2020   Myalgia 10/06/2020   Chronic pelvic pain in female 10/06/2020   Chronic abdominal pain 10/06/2020   IBS (irritable bowel syndrome) 08/02/2020   Pelvic floor dysfunction 08/02/2020   Irritant dermatitis 08/02/2020   Gastroesophageal reflux disease 06/28/2020   Severe recurrent major depression without psychotic features (HCC) 06/28/2020   Polyarthralgia 06/28/2020   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 06/28/2020   Tachycardia 06/28/2020   History of COVID-19  06/28/2020   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 deficit 06/28/2020   Recurrent major depressive disorder, in full remission (HCC) 06/28/2020   Atypical chest pain 06/22/2020   Dizziness 06/22/2020   SOB (shortness of breath) 06/22/2020   Palpitations 06/14/2020   Anxiety 05/04/2020   Internal and external hemorrhoids without complication 04/29/2020   ETD (Eustachian tube dysfunction), bilateral 10/21/2019   Acne vulgaris 03/11/2014   Mild intermittent asthma without complication 03/11/2014   Seasonal allergies 03/11/2014    REFERRING DIAG: Diagnosis S73.191A (ICD-10-CM) - Tear of right acetabular labrum, initial encounter   THERAPY DIAG:  Pain in right hip  Difficulty in walking, not elsewhere classified  PERTINENT HISTORY: Ehlers-Danlos, fibromyalgia  PRECAUTIONS: none  SUBJECTIVE: I am doing the gentle workouts at home.   PAIN:  Are you having pain? Yes uncomfortable Pain location: right hip and lower back in bil SIJ Pain orientation: Right  PAIN TYPE: aching Pain description: constant  Aggravating factors: weight bearing Relieving factors: lay supine but constantly hurts some    OBJECTIVE:    DIAGNOSTIC FINDINGS: MRI (right hip): There is a labral tear involving the right hip anterior superior.       COGNITION:            Overall cognitive status: Within functional limits for tasks assessed  SENSATION:            Pins and needles occasionally     MUSCLE LENGTH: General hypermobility and incr flexibiliity       LE AROM/PROM: grossly WFL   A/PROM Right 02/09/2021 Left 02/09/2021  Hip flexion      Hip abduction      Hip adduction      Hip extension      Hip internal rotation      Hip external rotation      Knee flexion      Knee extension      Ankle dorsiflexion      Ankle plantarflexion      Ankle inversion      Ankle eversion       (Blank rows = not tested)   LE MMT:   MMT Right 02/09/2021  Left 02/09/2021  Hip flexion 4-/5 5/5  Hip abduction 4-/5 5/5  Hip adduction      Hip extension 4-/5 5/5  Hip internal rotation      Hip external rotation      Knee flexion      Knee extension      Ankle dorsiflexion      Ankle plantarflexion      Ankle inversion      Ankle eversion       (Blank rows = not tested)       JOINT MOBILITY ASSESSMENT:  hypermobile     GAIT: Mild antalgia on Rt with Rt arm swing and guarded Lt arm for stability       Today's Treatment:  10/6  Fitting crutches  Gait with weight bearing in mind using crutches  Stairs with crutches  Getting into/out of tub   Eval Hooklying ab set SLR- hand support under sacrum for post pelvic tilt Clam SL hip abd Prone hip ext Prone glut set & hold Standing on 2 in step- RLE hang & distract     PATIENT EDUCATION:  Education details: post op protocol, restrictions, timeline Person educated: Patient Education method: Programmer, multimedia, Demonstration, Verbal cues, Handouts, and   Education comprehension: verbalized understanding, returned demonstration, verbal cues required     HOME EXERCISE PROGRAM: [id number]    ASSESSMENT:   CLINICAL IMPRESSION: Pt was educated on how to properly fit & use axillary crutches today. She will be with her mom for a few days post op but otherwise on her own. Discussed use of reacher to don pants as she will be limited to 90 deg hip flexion. Encouraged her to reach out with any further questions.    REHAB POTENTIAL: Good   CLINICAL DECISION MAKING: Stable/uncomplicated   EVALUATION COMPLEXITY: Low     GOALS: Goals reviewed with patient? Yes       LONG TERM GOALS:    LTG Name Target Date Goal status  1 Pt will be able to demo good core control with [place] LE exercises Baseline: worked on this in Optometrist of initial HEP 10/21   achieved  2 Pt will demo independence in stair navigation using AD Baseline:will work on this 10/21 achieved  3 Pt will be independent in  HEP that is appropriate to incr glut strength Baseline:will progress and establish as appropriate 10/21   achieved                                        PLAN: PT FREQUENCY: 1-2x/week  PT DURATION: 4 weeks   PLANNED INTERVENTIONS: Therapeutic exercises, Therapeutic activity, Neuro Muscular re-education, Balance training, Gait training, Patient/Family education, Joint mobilization, Stair training, Aquatic Therapy, Dry Needling, Electrical stimulation, Moist heat, Taping, Traction, and Manual therapy   PLAN FOR NEXT SESSION: contact PRN pre-op  Milind Raether C. Avriana Joo PT, DPT 02/24/21 1:19 PM

## 2021-02-25 ENCOUNTER — Other Ambulatory Visit (HOSPITAL_BASED_OUTPATIENT_CLINIC_OR_DEPARTMENT_OTHER): Payer: Self-pay | Admitting: Orthopaedic Surgery

## 2021-02-25 DIAGNOSIS — S73191A Other sprain of right hip, initial encounter: Secondary | ICD-10-CM

## 2021-02-25 DIAGNOSIS — K3184 Gastroparesis: Secondary | ICD-10-CM | POA: Diagnosis not present

## 2021-02-25 DIAGNOSIS — G8929 Other chronic pain: Secondary | ICD-10-CM | POA: Diagnosis not present

## 2021-02-25 DIAGNOSIS — R109 Unspecified abdominal pain: Secondary | ICD-10-CM | POA: Diagnosis not present

## 2021-02-25 DIAGNOSIS — K582 Mixed irritable bowel syndrome: Secondary | ICD-10-CM | POA: Diagnosis not present

## 2021-02-28 NOTE — Progress Notes (Signed)
Internal Medicine Clinic Attending  I saw and evaluated the patient.  I personally confirmed the key portions of the history and exam documented by Dr. Khan and I reviewed pertinent patient test results.  The assessment, diagnosis, and plan were formulated together and I agree with the documentation in the resident's note.  

## 2021-03-01 ENCOUNTER — Encounter (HOSPITAL_COMMUNITY): Payer: Self-pay | Admitting: Orthopaedic Surgery

## 2021-03-01 ENCOUNTER — Other Ambulatory Visit: Payer: Self-pay

## 2021-03-01 DIAGNOSIS — K3189 Other diseases of stomach and duodenum: Secondary | ICD-10-CM | POA: Diagnosis not present

## 2021-03-01 DIAGNOSIS — J4520 Mild intermittent asthma, uncomplicated: Secondary | ICD-10-CM | POA: Diagnosis not present

## 2021-03-01 DIAGNOSIS — K582 Mixed irritable bowel syndrome: Secondary | ICD-10-CM | POA: Diagnosis not present

## 2021-03-01 DIAGNOSIS — Q796 Ehlers-Danlos syndrome, unspecified: Secondary | ICD-10-CM | POA: Diagnosis not present

## 2021-03-01 DIAGNOSIS — K3184 Gastroparesis: Secondary | ICD-10-CM | POA: Diagnosis not present

## 2021-03-01 DIAGNOSIS — G43909 Migraine, unspecified, not intractable, without status migrainosus: Secondary | ICD-10-CM | POA: Insufficient documentation

## 2021-03-01 NOTE — Progress Notes (Signed)
Anesthesia Chart Review:  Pt is a same day work up   Case: [id number] Date/Time: 03/02/21 1245   Procedure: RIGHT HIP ARTHROSCOPY WITH LABRAL TEAR AND CAM AND PINCE DEBRIDEMENT (Right)   Anesthesia type: General   Pre-op diagnosis: Right Hip Labral Tear   [facility]: [facility] ROOM [facility] / [facility]   Surgeons: [name], MD       DISCUSSION: Pt is 25 years old with hx palpitations, gastroparesis   PROVIDERS: - PCP is Masters, Florentina Addison, DO  - Saw cardiologist Wille Glaser, MD 06/22/20 for palpitations, SOB, dizziness, and atypical chest pain (notes in care everywhere). Holter monitor did not show arrhythmia (see below), pt did not get echo as ordered, did not follow up.   - Saw cardiologist Laurance Flatten, MD 07/08/20 for same complaints as above of palpitations, SOB, dizziness, and atypical chest pain . Not orthostatic by BP or HR, not evidence for POTS at this visit. Echo completed (reassuring - see below). 1 month f/u recommended but I do not see that pt f/u with cardiology   LABS: Will be obtained day of surgery    EKG: none available. Will be obtained day of surgery    CV: Echo 07/09/20:  1. Left ventricular ejection fraction, by estimation, is 60 to 65%. The left ventricle has normal function. The left ventricle has no regional wall motion abnormalities. Left ventricular diastolic parameters were normal.  2. Right ventricular systolic function is normal. The right ventricular size is normal.  3. The mitral valve is normal in structure. No evidence of mitral valve regurgitation. No evidence of mitral stenosis.  4. The aortic valve is normal in structure. Aortic valve regurgitation is not visualized. No aortic stenosis is present.   Holter monitor (4 day) 07/09/20 (care everywhere):  - Only 5 PVCs noted.  - Only 175 supraventricular ectopic beats noted.  - 1 couplet of PACs noted.  - 1 triplet of PACs noted at 10:44 PM on February 4 with heart rate 1 1 bpm.  - 20 patient  triggered episodes were recorded.  During those episodes strips revealed sinus rhythm with heart rate 76-120 and one episode with heart rate 129 and one episode 134 bpm and one episode 148 bpm with no significant arrhythmias noted.   - No SVT, no A. fib, no VT, no long pauses 3 seconds or more noted.   Past Medical History:  Diagnosis Date   Acute recurrent pansinusitis 10/21/2019   ADHD (attention deficit hyperactivity disorder)    inattentive   Anemia    Anxiety    Asthma    Bronchitis    Cat allergy due to both airborne and skin contact 05/01/2016   Chronic sore throat 05/01/2016   COVID 2020   Depression    Fibromyalgia    Gastroparesis    GERD (gastroesophageal reflux disease)    Hypermobile Ehlers-Danlos syndrome    Keratosis pilaris    Migraine    Seasonal allergic rhinitis 03/11/2014    Past Surgical History:  Procedure Laterality Date   ESOPHAGOGASTRODUODENOSCOPY ENDOSCOPY     03/2020   SMART PILL PROCEDURE     12/2020   TYMPANOSTOMY TUBE PLACEMENT Bilateral    WISDOM TOOTH EXTRACTION      MEDICATIONS: No current facility-administered medications for this encounter.    Acetylcysteine (NAC) 600 MG CAPS   albuterol (VENTOLIN HFA) 108 (90 Base) MCG/ACT inhaler   Cholecalciferol (VITAMIN D3 PO)   dicyclomine (BENTYL) 10 MG capsule   Ferrous Sulfate Dried (EQ  SLOW-RELEASE IRON) 45 MG TBCR   fluticasone (FLONASE) 50 MCG/ACT nasal spray   loratadine (CLARITIN) 10 MG tablet   Multiple Vitamins-Minerals (ADULT GUMMY PO)   ondansetron (ZOFRAN ODT) 4 MG disintegrating tablet   OVER THE COUNTER MEDICATION   pregabalin (LYRICA) 25 MG capsule   aspirin EC 325 MG tablet   Prucalopride Succinate 2 MG TABS   RABEprazole (ACIPHEX) 20 MG tablet    If EKG/labs acceptable day of surgery, I anticipate pt can proceed with surgery as scheduled.  Rica Mast, PhD, FNP-BC [facility]/Anesthesiology Phone: [phone] 03/01/2021 1:44 PM

## 2021-03-01 NOTE — Progress Notes (Signed)
Spoke with pt for pre-op call. Pt states she has hx of an irregular heart rate. Wore a holter monitor in February this year and has seen Dr. Shari Prows, cardiologist in February. Pt had Covid in 2020. Has extensive medical history.  Pt's surgery is scheduled as ambulatory so no Covid test is required prior to surgery.  Chart sent to Anesthesia PA to review.

## 2021-03-01 NOTE — Anesthesia Preprocedure Evaluation (Addendum)
Anesthesia Evaluation  Patient identified by MRN, date of birth, ID band Patient awake    Reviewed: Allergy & Precautions, NPO status , Patient's Chart, lab work & pertinent test results  History of Anesthesia Complications Negative for: history of anesthetic complications  Airway Mallampati: II  TM Distance: >3 FB Neck ROM: Full    Dental  (+) Dental Advisory Given   Pulmonary asthma (has not needed an inhaler in over a year) , Patient abstained from smoking., former smoker,    breath sounds clear to auscultation       Cardiovascular  Rhythm:Regular Rate:Normal  06/2020 ECHO: EF 60-65%, normal LVF, no significant valvular abnormalities, normal ECHO  ? Dysautonomia/syncope   Neuro/Psych  Headaches, Anxiety Depression    GI/Hepatic Neg liver ROS, GERD  Medicated and Controlled,  Endo/Other  negative endocrine ROS  Renal/GU negative Renal ROS     Musculoskeletal   Abdominal   Peds  Hematology negative hematology ROS (+)   Anesthesia Other Findings Ehler-Danlos  Reproductive/Obstetrics                           Anesthesia Physical Anesthesia Plan  ASA: 2  Anesthesia Plan: General   Post-op Pain Management:    Induction: Intravenous  PONV Risk Score and Plan: 3 and Ondansetron and Dexamethasone  Airway Management Planned: Oral ETT  Additional Equipment: None  Intra-op Plan:   Post-operative Plan: Extubation in OR  Informed Consent: I have reviewed the patients History and Physical, chart, labs and discussed the procedure including the risks, benefits and alternatives for the proposed anesthesia with the patient or authorized representative who has indicated his/her understanding and acceptance.     Dental advisory given  Plan Discussed with: CRNA and Surgeon  Anesthesia Plan Comments: (See APP note by Joslyn Hy, FNP )      Anesthesia Quick Evaluation

## 2021-03-02 ENCOUNTER — Encounter (HOSPITAL_COMMUNITY)
Admission: RE | Disposition: A | Discharge status: Home / Self Care | Payer: Self-pay | Source: Home / Self Care | Attending: Orthopaedic Surgery

## 2021-03-02 ENCOUNTER — Ambulatory Visit (HOSPITAL_COMMUNITY): Payer: BC Managed Care – PPO

## 2021-03-02 ENCOUNTER — Ambulatory Visit (HOSPITAL_COMMUNITY): Payer: BC Managed Care – PPO | Admitting: Emergency Medicine

## 2021-03-02 ENCOUNTER — Other Ambulatory Visit: Payer: Self-pay

## 2021-03-02 ENCOUNTER — Ambulatory Visit (HOSPITAL_COMMUNITY)
Admission: RE | Admit: 2021-03-02 | Discharge: 2021-03-02 | Disposition: A | Discharge status: Home / Self Care | Payer: BC Managed Care – PPO | Attending: Orthopaedic Surgery | Admitting: Orthopaedic Surgery

## 2021-03-02 ENCOUNTER — Encounter (HOSPITAL_COMMUNITY): Payer: Self-pay | Admitting: Orthopaedic Surgery

## 2021-03-02 DIAGNOSIS — Z87891 Personal history of nicotine dependence: Secondary | ICD-10-CM | POA: Diagnosis not present

## 2021-03-02 DIAGNOSIS — G43909 Migraine, unspecified, not intractable, without status migrainosus: Secondary | ICD-10-CM | POA: Diagnosis not present

## 2021-03-02 DIAGNOSIS — Z8616 Personal history of COVID-19: Secondary | ICD-10-CM | POA: Diagnosis not present

## 2021-03-02 DIAGNOSIS — Z881 Allergy status to other antibiotic agents status: Secondary | ICD-10-CM | POA: Insufficient documentation

## 2021-03-02 DIAGNOSIS — S73191A Other sprain of right hip, initial encounter: Secondary | ICD-10-CM

## 2021-03-02 DIAGNOSIS — M797 Fibromyalgia: Secondary | ICD-10-CM | POA: Insufficient documentation

## 2021-03-02 DIAGNOSIS — J45909 Unspecified asthma, uncomplicated: Secondary | ICD-10-CM | POA: Diagnosis not present

## 2021-03-02 DIAGNOSIS — M24851 Other specific joint derangements of right hip, not elsewhere classified: Secondary | ICD-10-CM | POA: Diagnosis not present

## 2021-03-02 DIAGNOSIS — M25851 Other specified joint disorders, right hip: Secondary | ICD-10-CM | POA: Diagnosis not present

## 2021-03-02 DIAGNOSIS — Z79899 Other long term (current) drug therapy: Secondary | ICD-10-CM | POA: Diagnosis not present

## 2021-03-02 DIAGNOSIS — Z419 Encounter for procedure for purposes other than remedying health state, unspecified: Secondary | ICD-10-CM

## 2021-03-02 DIAGNOSIS — Z96641 Presence of right artificial hip joint: Secondary | ICD-10-CM | POA: Diagnosis not present

## 2021-03-02 DIAGNOSIS — D649 Anemia, unspecified: Secondary | ICD-10-CM | POA: Diagnosis not present

## 2021-03-02 DIAGNOSIS — Z471 Aftercare following joint replacement surgery: Secondary | ICD-10-CM | POA: Diagnosis not present

## 2021-03-02 DIAGNOSIS — K219 Gastro-esophageal reflux disease without esophagitis: Secondary | ICD-10-CM | POA: Diagnosis not present

## 2021-03-02 HISTORY — DX: Attention-deficit hyperactivity disorder, unspecified type: F90.9

## 2021-03-02 HISTORY — DX: Anxiety disorder, unspecified: F41.9

## 2021-03-02 HISTORY — DX: Gastro-esophageal reflux disease without esophagitis: K21.9

## 2021-03-02 HISTORY — DX: Hypermobile Ehlers-Danlos syndrome: Q79.62

## 2021-03-02 HISTORY — DX: Cardiac arrhythmia, unspecified: I49.9

## 2021-03-02 HISTORY — PX: LABRAL REPAIR: SHX5172

## 2021-03-02 HISTORY — DX: Anemia, unspecified: D64.9

## 2021-03-02 LAB — CBC
HCT: 37 % (ref 36.0–46.0)
Hemoglobin: 12.6 g/dL (ref 12.0–15.0)
MCH: 31.2 pg (ref 26.0–34.0)
MCHC: 34.1 g/dL (ref 30.0–36.0)
MCV: 91.6 fL (ref 80.0–100.0)
Platelets: 206 10*3/uL (ref 150–400)
RBC: 4.04 MIL/uL (ref 3.87–5.11)
RDW: 11.9 % (ref 11.5–15.5)
WBC: 4.3 10*3/uL (ref 4.0–10.5)
nRBC: 0 % (ref 0.0–0.2)

## 2021-03-02 LAB — BASIC METABOLIC PANEL
Anion gap: 7 (ref 5–15)
BUN: 10 mg/dL (ref 6–20)
CO2: 25 mmol/L (ref 22–32)
Calcium: 9.3 mg/dL (ref 8.9–10.3)
Chloride: 108 mmol/L (ref 98–111)
Creatinine, Ser: 0.6 mg/dL (ref 0.44–1.00)
GFR, Estimated: >60 mL/min (ref >60)
Glucose, Bld: 79 mg/dL (ref 70–99)
Potassium: 3.7 mmol/L (ref 3.5–5.1)
Sodium: 140 mmol/L (ref 135–145)

## 2021-03-02 LAB — POCT PREGNANCY, URINE: Preg Test, Ur: NEGATIVE

## 2021-03-02 SURGERY — ARTHROSCOPIC LABRAL REPAIR
Anesthesia: General | Site: Hip | Laterality: Right

## 2021-03-02 MED ORDER — SODIUM CHLORIDE 0.9 % IR SOLN
Status: DC | PRN
  Administered 2021-03-02: 6000 mL

## 2021-03-02 MED ORDER — ORAL CARE MOUTH RINSE: 15.0000 mL | Freq: Once | OROMUCOSAL | Status: AC

## 2021-03-02 MED ORDER — MEPERIDINE HCL 25 MG/ML IJ SOLN
6.2500 mg | INTRAMUSCULAR | Status: DC | PRN
  Administered 2021-03-02: 6.25 mg via INTRAVENOUS

## 2021-03-02 MED ORDER — SODIUM CHLORIDE 0.9 % IR SOLN
Status: DC | PRN
  Administered 2021-03-02: 6000 mL
  Administered 2021-03-02: 1800 mL
  Administered 2021-03-02: 6000 mL

## 2021-03-02 MED ORDER — MIDAZOLAM HCL 2 MG/2ML IJ SOLN: 0.5000 mg | Freq: Once | INTRAMUSCULAR | Status: DC | PRN

## 2021-03-02 MED ORDER — MIDAZOLAM HCL 2 MG/2ML IJ SOLN
INTRAMUSCULAR | Status: DC | PRN
  Administered 2021-03-02: 2 mg via INTRAVENOUS

## 2021-03-02 MED ORDER — BUPIVACAINE-EPINEPHRINE (PF) 0.25% -1:200000 IJ SOLN
INTRAMUSCULAR | Status: AC
  Filled 2021-03-02: qty 30

## 2021-03-02 MED ORDER — CEFAZOLIN SODIUM-DEXTROSE 2-4 GM/100ML-% IV SOLN
2.0000 g | INTRAVENOUS | Status: AC
  Administered 2021-03-02: 2 g via INTRAVENOUS
  Filled 2021-03-02: qty 100

## 2021-03-02 MED ORDER — OXYCODONE HCL 5 MG PO TABS: 5.0000 mg | ORAL_TABLET | Freq: Once | ORAL | Status: DC | PRN

## 2021-03-02 MED ORDER — PROPOFOL 10 MG/ML IV BOLUS
INTRAVENOUS | Status: DC | PRN
  Administered 2021-03-02: 80 mg via INTRAVENOUS
  Administered 2021-03-02: 120 mg via INTRAVENOUS

## 2021-03-02 MED ORDER — HYDROMORPHONE HCL 1 MG/ML IJ SOLN
0.2500 mg | INTRAMUSCULAR | Status: DC | PRN
  Administered 2021-03-02 (×2): 0.5 mg via INTRAVENOUS

## 2021-03-02 MED ORDER — ROCURONIUM BROMIDE 10 MG/ML (PF) SYRINGE
PREFILLED_SYRINGE | INTRAVENOUS | Status: AC
  Filled 2021-03-02: qty 10

## 2021-03-02 MED ORDER — DEXAMETHASONE SODIUM PHOSPHATE 10 MG/ML IJ SOLN
INTRAMUSCULAR | Status: DC | PRN
  Administered 2021-03-02: 5 mg via INTRAVENOUS

## 2021-03-02 MED ORDER — FENTANYL CITRATE (PF) 250 MCG/5ML IJ SOLN
INTRAMUSCULAR | Status: DC | PRN
  Administered 2021-03-02 (×3): 50 ug via INTRAVENOUS
  Administered 2021-03-02: 100 ug via INTRAVENOUS

## 2021-03-02 MED ORDER — PROMETHAZINE HCL 25 MG/ML IJ SOLN
INTRAMUSCULAR | Status: AC
  Administered 2021-03-02: 6.25 mg
  Filled 2021-03-02: qty 1

## 2021-03-02 MED ORDER — OXYCODONE HCL 5 MG/5ML PO SOLN: 5.0000 mg | Freq: Once | ORAL | Status: DC | PRN

## 2021-03-02 MED ORDER — PHENYLEPHRINE 40 MCG/ML (10ML) SYRINGE FOR IV PUSH (FOR BLOOD PRESSURE SUPPORT)
PREFILLED_SYRINGE | INTRAVENOUS | Status: AC
  Filled 2021-03-02: qty 10

## 2021-03-02 MED ORDER — HYDROMORPHONE HCL 1 MG/ML IJ SOLN
INTRAMUSCULAR | Status: AC
  Filled 2021-03-02: qty 1

## 2021-03-02 MED ORDER — ROCURONIUM BROMIDE 10 MG/ML (PF) SYRINGE
PREFILLED_SYRINGE | INTRAVENOUS | Status: DC | PRN
Start: 2021-03-02 — End: 2021-03-02
  Administered 2021-03-02: 50 mg via INTRAVENOUS
  Administered 2021-03-02: 20 mg via INTRAVENOUS
  Administered 2021-03-02: 30 mg via INTRAVENOUS

## 2021-03-02 MED ORDER — PROPOFOL 10 MG/ML IV BOLUS
INTRAVENOUS | Status: AC
  Filled 2021-03-02: qty 20

## 2021-03-02 MED ORDER — LACTATED RINGERS IV SOLN: INTRAVENOUS | Status: DC

## 2021-03-02 MED ORDER — FENTANYL CITRATE (PF) 250 MCG/5ML IJ SOLN
INTRAMUSCULAR | Status: AC
  Filled 2021-03-02: qty 5

## 2021-03-02 MED ORDER — LIDOCAINE 2% (20 MG/ML) 5 ML SYRINGE
INTRAMUSCULAR | Status: DC | PRN
  Administered 2021-03-02: 20 mg via INTRAVENOUS

## 2021-03-02 MED ORDER — GABAPENTIN 300 MG PO CAPS
300.0000 mg | ORAL_CAPSULE | Freq: Once | ORAL | Status: AC
  Administered 2021-03-02: 300 mg via ORAL
  Filled 2021-03-02: qty 1

## 2021-03-02 MED ORDER — PROMETHAZINE HCL 25 MG/ML IJ SOLN
6.2500 mg | INTRAMUSCULAR | Status: DC | PRN
  Administered 2021-03-02: 6.25 mg via INTRAVENOUS

## 2021-03-02 MED ORDER — SCOPOLAMINE 1 MG/3DAYS TD PT72
1.0000 | MEDICATED_PATCH | TRANSDERMAL | Status: DC
  Administered 2021-03-02: 1.5 mg via TRANSDERMAL
  Filled 2021-03-02: qty 1

## 2021-03-02 MED ORDER — ACETAMINOPHEN 500 MG PO TABS
1000.0000 mg | ORAL_TABLET | Freq: Once | ORAL | Status: AC
  Administered 2021-03-02: 1000 mg via ORAL
  Filled 2021-03-02: qty 2

## 2021-03-02 MED ORDER — PHENYLEPHRINE HCL-NACL 20-0.9 MG/250ML-% IV SOLN
INTRAVENOUS | Status: DC | PRN
  Administered 2021-03-02: 20 ug/min via INTRAVENOUS

## 2021-03-02 MED ORDER — DEXAMETHASONE SODIUM PHOSPHATE 10 MG/ML IJ SOLN
INTRAMUSCULAR | Status: AC
  Filled 2021-03-02: qty 1

## 2021-03-02 MED ORDER — CHLORHEXIDINE GLUCONATE 0.12 % MT SOLN
15.0000 mL | Freq: Once | OROMUCOSAL | Status: AC
  Administered 2021-03-02: 15 mL via OROMUCOSAL
  Filled 2021-03-02: qty 15

## 2021-03-02 MED ORDER — PHENYLEPHRINE 40 MCG/ML (10ML) SYRINGE FOR IV PUSH (FOR BLOOD PRESSURE SUPPORT)
PREFILLED_SYRINGE | INTRAVENOUS | Status: DC | PRN
  Administered 2021-03-02 (×3): 40 ug via INTRAVENOUS

## 2021-03-02 MED ORDER — ACETAMINOPHEN 500 MG PO TABS: 1000.0000 mg | ORAL_TABLET | Freq: Once | ORAL | Status: DC

## 2021-03-02 MED ORDER — MEPERIDINE HCL 25 MG/ML IJ SOLN
INTRAMUSCULAR | Status: AC
  Filled 2021-03-02: qty 1

## 2021-03-02 MED ORDER — TRANEXAMIC ACID-NACL 1000-0.7 MG/100ML-% IV SOLN
1000.0000 mg | INTRAVENOUS | Status: AC
Start: 2021-03-02 — End: 2021-03-02
  Administered 2021-03-02: 1000 mg via INTRAVENOUS
  Filled 2021-03-02: qty 100

## 2021-03-02 MED ORDER — ONDANSETRON HCL 4 MG/2ML IJ SOLN
INTRAMUSCULAR | Status: AC
  Filled 2021-03-02: qty 2

## 2021-03-02 MED ORDER — MIDAZOLAM HCL 2 MG/2ML IJ SOLN
INTRAMUSCULAR | Status: AC
  Filled 2021-03-02: qty 2

## 2021-03-02 MED ORDER — ONDANSETRON HCL 4 MG/2ML IJ SOLN
INTRAMUSCULAR | Status: DC | PRN
  Administered 2021-03-02: 4 mg via INTRAVENOUS

## 2021-03-02 MED ORDER — LIDOCAINE 2% (20 MG/ML) 5 ML SYRINGE
INTRAMUSCULAR | Status: AC
  Filled 2021-03-02: qty 5

## 2021-03-02 MED ORDER — SUGAMMADEX SODIUM 200 MG/2ML IV SOLN
INTRAVENOUS | Status: DC | PRN
  Administered 2021-03-02: 100 mg via INTRAVENOUS

## 2021-03-02 MED ORDER — 0.9 % SODIUM CHLORIDE (POUR BTL) OPTIME
TOPICAL | Status: DC | PRN
  Administered 2021-03-02: 1000 mL

## 2021-03-02 SURGICAL SUPPLY — 63 items
ANCH SUT 1.4 SUT TPE (SUTURE) ×1
ANCH SUT 1.4 SUT TPE BLK/WHT (SUTURE) ×2
ANCH SUT 2.4 KNTLS INSRTR LCK (Anchor) ×2 IMPLANT
ANCHOR SUT 2.4 SS KNTLS #1 (Anchor) ×2 IMPLANT
APL PRP STRL LF DISP 70% ISPRP (MISCELLANEOUS) ×1
BAG COUNTER SPONGE SURGICOUNT (BAG) ×1 IMPLANT
BAG SPNG CNTER NS LX DISP (BAG) ×1
BIT DRILL SS CINCHLOCK (BIT) ×1 IMPLANT
BLADE SAMURAI STR FULL RADIUS (BLADE) ×1 IMPLANT
BLADE SURG 11 STRL SS (BLADE) ×2 IMPLANT
BUR ROUND HI FLUTE 8 4X19 (BURR) IMPLANT
BURR ROUND HI FLUTE 8 4X19 (BURR) ×2
CANNULA 8 123 TRANSPORT (CANNULA) ×1 IMPLANT
CANNULA FLOWPORT II OBTURATOR (CANNULA) ×1 IMPLANT
CANNULA OBTURATOR FLOWPORT ST5 (CANNULA) ×1 IMPLANT
CHLORAPREP W/TINT 26 (MISCELLANEOUS) ×2 IMPLANT
COVER MAYO STAND STRL (DRAPES) ×1 IMPLANT
COVER SURGICAL LIGHT HANDLE (MISCELLANEOUS) ×1 IMPLANT
DISSECTOR 4.2MMX19CM CVD HL (ORTHOPEDIC DISPOSABLE SUPPLIES) ×1 IMPLANT
DRAPE C-ARM 42X72 X-RAY (DRAPES) ×2 IMPLANT
DRAPE HIPCHECK EQUIP (DRAPES) ×1 IMPLANT
DRAPE STERI IOBAN 125X83 (DRAPES) ×2 IMPLANT
DRAPE U-SHAPE 47X51 STRL (DRAPES) ×4 IMPLANT
DRSG TEGADERM 4X4.75 (GAUZE/BANDAGES/DRESSINGS) ×3 IMPLANT
GAUZE SPONGE 4X4 12PLY STRL (GAUZE/BANDAGES/DRESSINGS) ×2 IMPLANT
GAUZE SPONGE 4X4 12PLY STRL LF (GAUZE/BANDAGES/DRESSINGS) ×1 IMPLANT
GLOVE SRG 8 PF TXTR STRL LF DI (GLOVE) ×2 IMPLANT
GLOVE SURG ENC MOIS LTX SZ7.5 (GLOVE) ×4 IMPLANT
GLOVE SURG UNDER POLY LF SZ8 (GLOVE) ×4
GOWN STRL REUS W/ TWL LRG LVL3 (GOWN DISPOSABLE) ×1 IMPLANT
GOWN STRL REUS W/ TWL XL LVL3 (GOWN DISPOSABLE) ×1 IMPLANT
GOWN STRL REUS W/TWL LRG LVL3 (GOWN DISPOSABLE) ×2
GOWN STRL REUS W/TWL XL LVL3 (GOWN DISPOSABLE) ×2
KIT BASIN OR (CUSTOM PROCEDURE TRAY) ×2 IMPLANT
KIT HIP ARTHROSCOPY (SET/KITS/TRAYS/PACK) ×2 IMPLANT
KIT PORTAL ENTRY HIP ACCESS (KITS) ×1 IMPLANT
KIT TURNOVER KIT B (KITS) ×2 IMPLANT
MANIFOLD NEPTUNE II (INSTRUMENTS) ×4 IMPLANT
MARKER SKIN DUAL TIP RULER LAB (MISCELLANEOUS) ×2 IMPLANT
NDL INJECTOR II CARTRIDGE (MISCELLANEOUS) IMPLANT
NDL SCORPION MULTI FIRE (NEEDLE) IMPLANT
NEEDLE INJECTOR II CARTRIDGE (MISCELLANEOUS) ×2 IMPLANT
NEEDLE SCORPION MULTI FIRE (NEEDLE) ×2 IMPLANT
PACK SURGICAL SETUP 50X90 (CUSTOM PROCEDURE TRAY) ×2 IMPLANT
PAD ARMBOARD 7.5X6 YLW CONV (MISCELLANEOUS) ×4 IMPLANT
PASSER SUT 1.5D CRESCENT (INSTRUMENTS) ×1 IMPLANT
PASSER SUT 70D UP ANGLED (INSTRUMENTS) ×1 IMPLANT
PORT APPOLLO RF 90DEGREE MULTI (SURGICAL WAND) ×1 IMPLANT
SPONGE T-LAP 18X18 ~~LOC~~+RFID (SPONGE) ×2 IMPLANT
SUT ETHILON 4 0 PS 2 18 (SUTURE) ×2 IMPLANT
SUT FIBERWIRE #2 38 T-5 BLUE (SUTURE)
SUT XBRAID 1.4 BLK/WHT (SUTURE) ×2 IMPLANT
SUT XBRAID 1.4 WHT/BLU 46 (SUTURE) ×1 IMPLANT
SUT ZIPLINE SZ2 BLK (SUTURE) ×3 IMPLANT
SUTURE FIBERWR #2 38 T-5 BLUE (SUTURE) IMPLANT
SYR 50ML LL SCALE MARK (SYRINGE) ×2 IMPLANT
TAPE CLOTH 4X10 WHT NS (GAUZE/BANDAGES/DRESSINGS) ×2 IMPLANT
TOWEL GREEN STERILE (TOWEL DISPOSABLE) ×2 IMPLANT
TUBE CONNECTING 12X1/4 (SUCTIONS) ×5 IMPLANT
TUBING ARTHROSCOPY IRRIG 16FT (MISCELLANEOUS) ×1 IMPLANT
WAND APOLLO RF 50D ABLATOR (BUR) ×1 IMPLANT
WATER STERILE IRR 1000ML POUR (IV SOLUTION) ×2 IMPLANT
YANKAUER SUCT BULB TIP NO VENT (SUCTIONS) ×1 IMPLANT

## 2021-03-02 NOTE — Progress Notes (Signed)
Spoke to [place] with Social Work and made her aware of patient's response to suicide questions. Per Carollee Herter, she will come and give the patient information on outpatient resources for counseling.  Called Dr. Steward Drone- patient's surgeon and made him aware of patient's response to suicide questions and also made him aware that per Carollee Herter with social work, a psychiatric consult can be placed if he thinks it is necessary. Per Dr. Steward Drone the patient sees a psychiatrist and he will come talk to the patient shortly to assess her.

## 2021-03-02 NOTE — Progress Notes (Signed)
CSW met with patient about the answer she gave in regards to suicidal ideations.Patient stated that she does not have any current thoughts of harming herself but has had them since she was 25 years old. Patient stated that she has childhood trauma and has depression and has also been on anti depressants for several years. Patient stated that she has a virtual appointment with [facility] on Friday 03/04/21. Patient also has a psychiatric evaluation with [facility] next Wednesday. Patient was provided group counseling resources that she requested. Patient stated she has been in therapy for years but did not like her last therapist.

## 2021-03-02 NOTE — Brief Op Note (Signed)
   Brief Op Note  Date of Surgery: 03/02/2021  Preoperative Diagnosis: Right Hip Labral Tear  Postoperative Diagnosis: same  Procedure: Procedure(s): RIGHT HIP ARTHROSCOPY WITH LABRAL REPAIR AND PINCE DEBRIDEMENT  Implants: Implant Name Type Inv. Item Serial No. Manufacturer Lot No. LRB No. Used Action  Cinchlock SS Knotless Anchor     [employer] [id number] Right 1 Implanted  Cinchlock SS Knotless Anchor    [employer] [id number] Right 1 Implanted    Surgeons: Surgeon(s): [name], MD  Anesthesia: General    Estimated Blood Loss: See anesthesia record  Complications: None  Condition to PACU: Stable  Benancio Deeds, MD 03/02/2021 4:48 PM

## 2021-03-02 NOTE — Progress Notes (Signed)
Patient answered Yes to suicide screening question- "In the past month, have you wished you were dead or wished you could go to sleep and not wake up?" Patient answered No to the question "In the past month, have you actually had any thoughts of killing yourself and answered No to to the question "Have you ever done anything, started to do anything, or prepared to do anything to end your life? Spoke to [place] from Social Work who stated she will find out which Child psychotherapist is Advertising copywriter Stay. Vito Berger my call back number and she stated she will call back shortly.

## 2021-03-02 NOTE — Anesthesia Procedure Notes (Signed)
Procedure Name: Intubation Date/Time: 03/02/2021 1:16 PM Performed by: Ardyth Harps, CRNA Pre-anesthesia Checklist: Patient identified, Emergency Drugs available, Suction available and Patient being monitored Patient Re-evaluated:Patient Re-evaluated prior to induction Oxygen Delivery Method: Circle System Utilized Preoxygenation: Pre-oxygenation with 100% oxygen Induction Type: IV induction Ventilation: Mask ventilation without difficulty Laryngoscope Size: Mac and 3 Grade View: Grade I Tube type: Oral Tube size: 7.0 mm Number of attempts: 1 Airway Equipment and Method: Stylet and Oral airway Placement Confirmation: ETT inserted through vocal cords under direct vision, positive ETCO2 and breath sounds checked- equal and bilateral Secured at: 21 cm Tube secured with: Tape Dental Injury: Teeth and Oropharynx as per pre-operative assessment

## 2021-03-02 NOTE — Discharge Instructions (Signed)
     Discharge Instructions    Attending Surgeon: [name], MD Office Phone Number: [phone]   Diagnosis and Procedures:    Surgeries Performed: Right hip arthroscopy with labral repair and pincer debridement  Discharge Plan:    Diet: Resume usual diet. Begin with light or bland foods.  Drink plenty of fluids.  Activity:  Touchdown weight bearing with crutches, brace from 20-90 You are advised to go home directly from the hospital or surgical center. Restrict your activities.  GENERAL INSTRUCTIONS: 1.  Keep your surgical site elevated above your heart for at least 5-7 days or longer to prevent swelling. This will improve your comfort and your overall recovery following surgery.     2. Please call Dr. Serena Croissant office at [phone] with questions Monday-Friday during business hours. If no one answers, please leave a message and someone should get back to the patient within 24 hours. For emergencies please call 911 or proceed to the emergency room.   3. Patient to notify surgical team if experiences any of the following: Bowel/Bladder dysfunction, uncontrolled pain, nerve/muscle weakness, incision with increased drainage or redness, nausea/vomiting and Fever greater than 101.0 F.  Be alert for signs of infection including redness, streaking, odor, fever or chills. Be alert for excessive pain or bleeding and notify your surgeon immediately.  WOUND INSTRUCTIONS:   Leave your dressing/cast/splint in place until your post operative visit.  Keep it clean and dry.  Always keep the incision clean and dry until the staples/sutures are removed. If there is no drainage from the incision you should keep it open to air. If there is drainage from the incision you must keep it covered at all times until the drainage stops  Do not soak in a bath tub, hot tub, pool, lake or other body of water until 21 days after your surgery and your incision is completely dry and healed.  If you  have removable sutures (or staples) they must be removed 10-14 days (unless otherwise instructed) from the day of your surgery.     1)  Elevate the extremity as much as possible.  2)  Keep the dressing clean and dry.  3)  Please call us if the dressing becomes wet or dirty.  4)  If you are experiencing worsening pain or worsening swelling, please call.     MEDICATIONS: Resume all previous home medications at the previous prescribed dose and frequency unless otherwise noted Start taking the  pain medications on an as-needed basis as prescribed  Please taper down pain medication over the next week following surgery.  Ideally you should not require a refill of any narcotic pain medication.  Take pain medication with food to minimize nausea. In addition to the prescribed pain medication, you may take over-the-counter pain relievers such as Tylenol.  Do NOT take additional tylenol if your pain medication already has tylenol in it.  Aspirin 325mg  daily for four weeks.      FOLLOWUP INSTRUCTIONS: 1. Follow up at the Physical Therapy Clinic 3-4 days following surgery. This appointment should be scheduled unless other arrangements have been made.The Physical Therapy scheduling number is [phone] if an appointment has not already been arranged.  2. Contact Dr. [phone] office during office hours at [phone] or the practice after hours line at [phone] for non-emergencies. For medical emergencies call 911.   Discharge Location: Home

## 2021-03-02 NOTE — Op Note (Signed)
Date of Surgery: 03/02/2021  INDICATIONS: Ms. Faron is a 25 y.o.-year-old female with right.  The risk and benefits of the procedure with discussed in detail and documented in the pre-operative evaluation.  PREOPERATIVE DIAGNOSIS: Right hip femoral acetabular impingement  POSTOPERATIVE DIAGNOSIS: Same.  PROCEDURE: 1.  Right hip labral repair 2.  Right hip pincer debridement  SURGEON: Benancio Deeds MD  ASSISTANT: Olga Millers, ATC; necessary for the timely completion of procedure and due to complexity of procedure.  ANESTHESIA:  general  IV FLUIDS AND URINE: See anesthesia record.  ANTIBIOTICS: Ancef 2g  ESTIMATED BLOOD LOSS: 5 mL.  IMPLANTS:  Implant Name Type Inv. Item Serial No. Manufacturer Lot No. LRB No. Used Action  Cinchlock SS Knotless Anchor     [employer] [id number] Right 1 Implanted  Cinchlock SS Knotless Anchor    [employer] [id number] Right 1 Implanted    DRAINS: None  CULTURES: None  COMPLICATIONS: none  DESCRIPTION OF PROCEDURE:  Cartilage Grade 1 changes of the acetabular cartilage High grade cartilage lesion: no The remainder of the femoral and acetabular cartilage was normal.  Labrum Labral degeneration, yellow over 50% of labrum: yes Hypoplastic labrum: yes Lipstick sign at the psoas prominence: positive Psoas Prominence: no  Boundaries of labral tear Convention (3 o'clock anterior, 9 o'clock posterior) Anterior boundary: 3 o'clock Posterior boundary: 12 o'clock  OPERATIVE REPORT:  The patient was brought to the operating room, placed supine on the operating table, and bony prominences were padded.  The traction boots were applied with padding to ensure that safe traction could be applied through the feet.  The contralateral limb was abducted maximally and light traction was applied.  The operative leg was brought into neutral position.  The flouroscopic c-arm was brought between the legs for an AP image.  The patient  was prepped and draped in a sterile fashion.  Time-out was performed and landmarks were identified. Traction was obtained and care was taken to ensure the least amount of force necessary to allow safe access to the joint of 8-91mm.  This was checked with fluoroscopy.   Next we placed an anterolateral portal under the assistance of fluoroscopy.  First, fluoroscopy was used to estimate the trajectory and starting point.  A 53mm incision with a #11 blade was made and a straight hemostat was used to dilate the portal through the appropriate tract.  We then placed a 14-gauge hypodermic needle with careful technique to be as close to the femoral head as possible and parallel to the sorcele to ensure no iatrogenic damage to the labrum.  This released the negative pressure environment and the amount of traction was adjusted to maintain the 8-37mm of distraction.  A nitinol wire was placed through the needle and flouroscopy was used to ensure it extended to the medial wall of the acetabulum.  The Flowport from [employer] was placed over the wire and the nitinol wire was retracted to just inside the capsule during insertion of the dilator and cannula to minimize the risk of breakage. The arthroscope was placed next and we visualized the anterior triangle.   We then placed the anterior portal under direct visualization using the technique described above.  This was safely placed as well without damage to the labrum or femoral head.  We then switched our arthroscope to the anterior portal to ensure we were not through the labrum - we were safely through the capsule only.  We then proceeded with a transverse capsulotomy connecting  the 2 portals in the same plane utilizing the Samurai blade from [employer].  The Injector device from [employer] was used to place traction stitches each in the medial and lateral arms of the proximal capsule.  A Kelly clamp was used to hold the suture against the skin to apply  traction. This allowed access to the acetabular rim and labrum as well as protection of the native edges of the capsule.  We identified the anterior inferior iliac spine proximally, the psoas tendon medially and the rectus tendon laterally as landmarks.  We then proceeded with a diagnostic arthroscopy - the results can be found in the findings section above.   We then used the 50 degree hip specific radiofrequency device from [employer]. and a 55mm shaver to clear the superior acetabulum and expose the subspinous region.  Next we exposed the acetabular rim leaving the chondral labral junction intact.  Working from both portals, the acetabular rim/subspinous region was reshaped with 5.5 mm bur consistent with the preoperative three-dimensional imaging. When adequate reshaping was obtained we then proceeded with the labral repair.  A Transport cannula was inserted anteriorly.  Care was taken to ensure the cannula was in the intermuscular plane between the gluteus minimus and iliocapsularis.    We placed 2 anchors at the 2:30 o'clock as well as 12 o'clock, with a simple stitch at each of the above positions respectively. The sutures were passed using the crescent [employer].  This resulted in anatomic labral repair.  We debrided the loose cartilage at the rim and residual degenerative labral tissue.  Traction was let down with total traction time of 86 minutes.    Dynamic exam showed no residual impingement flexed to 90 degrees with maximal internal rotation.  Finally, we performed a complete capsular closure with, utilizing a figure of 8 stitch in the interportal capsulotomy A watertight repair of the capsule was obtained.  We then removed the arthroscope and closed the incisions with 4-0 nylon simple stitches.  A sterile dressing was applied..  The patient was awakened from anesthesia and transferred to PACU in stable condition. Postoperative care includes:      Olga Millers ATC was necessary for  opening, closing, retracting, limb positioning and overall facilitation and timely completion of the procedure.    2 weeks of touch down weight bearing Formal physical therapy will begin this week. Brace to be set from 90 to 20 of extension  POSTOPERATIVE PLAN:  Benancio Deeds, MD 4:48 PM

## 2021-03-02 NOTE — Anesthesia Postprocedure Evaluation (Signed)
Anesthesia Post Note  Patient: Brianna [name]  Procedure(s) Performed: RIGHT HIP ARTHROSCOPY WITH LABRAL REPAIR AND PINCE DEBRIDEMENT (Right: Hip)     Patient location during evaluation: PACU Anesthesia Type: General Level of consciousness: sedated, oriented and patient cooperative Pain management: pain level controlled (pain improved) Vital Signs Assessment: post-procedure vital signs reviewed and stable Respiratory status: spontaneous breathing, nonlabored ventilation and respiratory function stable Cardiovascular status: blood pressure returned to baseline and stable Postop Assessment: no apparent nausea or vomiting Anesthetic complications: no Comments: Shivering resolved   No notable events documented.                 Germaine Pomfret

## 2021-03-02 NOTE — Interval H&P Note (Signed)
History and Physical Interval Note:  03/02/2021 12:26 PM  Brianna [name]  has presented today for surgery, with the diagnosis of Right Hip Labral Tear.  The various methods of treatment have been discussed with the patient and family. After consideration of risks, benefits and other options for treatment, the patient has consented to  Procedure(s): RIGHT HIP ARTHROSCOPY WITH LABRAL TEAR AND CAM AND PINCE DEBRIDEMENT (Right) as a surgical intervention.  The patient's history has been reviewed, patient examined, no change in status, stable for surgery.  I have reviewed the patient's chart and labs.  Questions were answered to the patient's satisfaction.     [name]

## 2021-03-02 NOTE — Transfer of Care (Signed)
Immediate Anesthesia Transfer of Care Note  Patient: Brianna [name]  Procedure(s) Performed: RIGHT HIP ARTHROSCOPY WITH LABRAL REPAIR AND PINCE DEBRIDEMENT (Right: Hip)  Patient Location: PACU  Anesthesia Type:General  Level of Consciousness: awake, alert  and oriented  Airway & Oxygen Therapy: Patient Spontanous Breathing  Post-op Assessment: Report given to RN and Post -op Vital signs reviewed and stable  Post vital signs: Reviewed and stable  Last Vitals:  Vitals Value Taken Time  BP 135/51 03/02/21 1652  Temp    Pulse 104 03/02/21 1655  Resp 10 03/02/21 1655  SpO2 98 % 03/02/21 1655  Vitals shown include unvalidated device data.  Last Pain:  Vitals:   03/02/21 1109  TempSrc:   PainSc: 6       Patients Stated Pain Goal: 2 (03/02/21 1109)  Complications: No notable events documented.

## 2021-03-02 NOTE — Progress Notes (Signed)
Carollee Herter from Social Work came and gave patient information on community resources. Dr. Steward Drone assessed patient prior to surgery. No new orders received.

## 2021-03-04 ENCOUNTER — Encounter (HOSPITAL_BASED_OUTPATIENT_CLINIC_OR_DEPARTMENT_OTHER): Payer: Self-pay | Admitting: Physical Therapy

## 2021-03-04 ENCOUNTER — Other Ambulatory Visit: Payer: Self-pay

## 2021-03-04 ENCOUNTER — Ambulatory Visit (HOSPITAL_BASED_OUTPATIENT_CLINIC_OR_DEPARTMENT_OTHER): Payer: BC Managed Care – PPO | Admitting: Physical Therapy

## 2021-03-04 DIAGNOSIS — F411 Generalized anxiety disorder: Secondary | ICD-10-CM | POA: Diagnosis not present

## 2021-03-04 DIAGNOSIS — R262 Difficulty in walking, not elsewhere classified: Secondary | ICD-10-CM | POA: Diagnosis not present

## 2021-03-04 DIAGNOSIS — M6281 Muscle weakness (generalized): Secondary | ICD-10-CM | POA: Diagnosis not present

## 2021-03-04 DIAGNOSIS — F32A Depression, unspecified: Secondary | ICD-10-CM | POA: Diagnosis not present

## 2021-03-04 DIAGNOSIS — M25551 Pain in right hip: Secondary | ICD-10-CM | POA: Diagnosis not present

## 2021-03-04 DIAGNOSIS — S73191A Other sprain of right hip, initial encounter: Secondary | ICD-10-CM | POA: Diagnosis not present

## 2021-03-04 NOTE — Therapy (Signed)
OUTPATIENT PHYSICAL THERAPY LOWER EXTREMITY Re-Evaluation for post-op treatments   Patient Name: Brianna [name] MRN: [id number] DOB:1996-03-05, 25 y.o., female Today's Date: 03/04/2021   PT End of Session - 03/04/21 2033     Visit Number 3    Number of Visits 35    Date for PT Re-Evaluation 06/24/21    Authorization Type [employer]    PT Start Time 1230    PT Stop Time 1310    PT Time Calculation (min) 40 min    Activity Tolerance Patient tolerated treatment well    Behavior During Therapy [facility] for tasks assessed/performed             Past Medical History:  Diagnosis Date   Acute recurrent pansinusitis 10/21/2019   ADHD (attention deficit hyperactivity disorder)    inattentive   Anemia    Anxiety    Asthma    Bronchitis    Cat allergy due to both airborne and skin contact 05/01/2016   Chronic sore throat 05/01/2016   COVID 2020   Depression    Fibromyalgia    Gastroparesis    GERD (gastroesophageal reflux disease)    Hypermobile Ehlers-Danlos syndrome    Irregular heart rate    Keratosis pilaris    Migraine    Seasonal allergic rhinitis 03/11/2014   Past Surgical History:  Procedure Laterality Date   ESOPHAGOGASTRODUODENOSCOPY ENDOSCOPY     03/2020   SMART PILL PROCEDURE     12/2020   TYMPANOSTOMY TUBE PLACEMENT Bilateral    WISDOM TOOTH EXTRACTION     Patient Active Problem List   Diagnosis Date Noted   Headache 11/30/2020   Positive ANA (antinuclear antibody) 11/15/2020   Fibromyalgia 11/02/2020   Hair loss 11/02/2020   Muscle spasticity 10/22/2020   Rash and other nonspecific skin eruption 10/22/2020   Paresthesia of bilateral legs 10/06/2020   Myalgia 10/06/2020   Chronic pelvic pain in female 10/06/2020   Chronic abdominal pain 10/06/2020   IBS (irritable bowel syndrome) 08/02/2020   Pelvic floor dysfunction 08/02/2020   Irritant dermatitis 08/02/2020   Gastroesophageal reflux disease 06/28/2020   Severe recurrent major depression without  psychotic features (HCC) 06/28/2020   Polyarthralgia 06/28/2020   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 06/28/2020   Tachycardia 06/28/2020   History of COVID-19 06/28/2020   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 deficit 06/28/2020   Recurrent major depressive disorder, in full remission (HCC) 06/28/2020   Atypical chest pain 06/22/2020   Dizziness 06/22/2020   SOB (shortness of breath) 06/22/2020   Palpitations 06/14/2020   Anxiety 05/04/2020   Internal and external hemorrhoids without complication 04/29/2020   ETD (Eustachian tube dysfunction), bilateral 10/21/2019   Acne vulgaris 03/11/2014   Mild intermittent asthma without complication 03/11/2014   Seasonal allergies 03/11/2014    PCP: Rudene Christians, DO  REFERRING PROVIDER: Rudene Christians, DO  REFERRING DIAG: [name], MD  THERAPY DIAG:  Pain in right hip  Difficulty in walking, not elsewhere classified  Muscle weakness (generalized)  ONSET DATE: DOS 10/12  SUBJECTIVE:   SUBJECTIVE STATEMENT: It does not feel great. I was reaching over to grab something yesterday and it popped, similar to the popping it did when it was torn. Wearing the brace and using crutches to not bear weight.   PERTINENT HISTORY: EDS, chronic abdominal/pelvic pain  PAIN:  Are you having pain? Yes VAS scale: 7/10 Pain location: Rt hip Pain orientation: Right  PAIN TYPE: aching Pain description: constant  Aggravating factors: moving from sit to stand  Relieving factors: lay down with leg propped up  PRECAUTIONS: Rt hip labral repair  WEIGHT BEARING RESTRICTIONS Yes NWB until 10/26, followed by WBAT ; hip flexion limited to 90 deg passively, no active hip flexion activation  FALLS:  Has patient fallen in last 6 months? No  LIVING ENVIRONMENT: Lives with:  has a roommate Lives in: House/apartment Stairs: Yes; 2 stories- bedroom upstairs Has following equipment at home: Crutches  OCCUPATION:  restaurant server  PLOF: Independent  PATIENT GOALS get back to normal, decrease pain, able to exercise for EDS stability   OBJECTIVE:    COGNITION:  Overall cognitive status: Within functional limits for tasks assessed     SENSATION:  Some numbness around hip as expected post op  MUSCLE LENGTH: Hypermobile with increased flexibility grossly  POSTURE:  Arrived with kyphotic posture on crutches as they were too short.   LE AROM/PROM:  A/PROM Right 03/04/2021 Left 03/04/2021  Hip flexion 70   Hip extension    Hip abduction    Hip adduction    Hip internal rotation    Hip external rotation    Knee flexion    Knee extension    Ankle dorsiflexion    Ankle plantarflexion    Ankle inversion    Ankle eversion     (Blank rows = not tested)  LE MMT:  MMT Right 03/04/2021 Left 03/04/2021  Hip flexion    Hip extension    Hip abduction    Hip adduction    Hip internal rotation    Hip external rotation    Knee flexion    Knee extension    Ankle dorsiflexion    Ankle plantarflexion    Ankle inversion    Ankle eversion     (Blank rows = not tested)   GAIT: Modified independence with bil axillary crutches    TODAY'S TREATMENT: Manual: passive hip flexion, rotation within protocol limitations  Ankle pumps Quad set Ab set Adjusted crutches to proper height  PATIENT EDUCATION:  Education details: Teacher, music of condition, POC, HEP, exercise form/rationale Person educated: Patient Education method: Explanation, Demonstration, Tactile cues, Verbal cues, and Handouts Education comprehension: verbalized understanding, returned demonstration, verbal cues required, tactile cues required, and needs further education   HOME EXERCISE PROGRAM: [id number]  ASSESSMENT:  CLINICAL IMPRESSION: Patient is a 25 y.o. F who was seen today for physical therapy evaluation and treatment for s/p Rt hip labrum repair 03/02/21. Objective impairments include Abnormal gait, decreased  activity tolerance, decreased mobility, difficulty walking, decreased ROM, decreased strength, improper body mechanics, postural dysfunction, and pain. These impairments are limiting patient from cleaning, community activity, driving, meal prep, occupation, laundry, and shopping. Personal factors including 3+ comorbidities: EDS, chronic abdomainal/pelvic pain  are also affecting patient's functional outcome. Patient will benefit from skilled PT to address above impairments and improve overall function. Rush protocol with exception that WB is toe-touch for 2 weeks and then prog as tolerated. [email or web address]  REHAB POTENTIAL: Good  CLINICAL DECISION MAKING: Evolving/moderate complexity  EVALUATION COMPLEXITY: Moderate   GOALS: Goals reviewed with patient? Yes  SHORT TERM GOALS:  STG Name Target Date Goal status  1 Begin walking household distances without AD Baseline:  03/25/21 INITIAL  2 Hip PROM 80% of Lt  Baseline: limited to 90 deg at eval 04/01/21 INITIAL  3 30 second 5TSTS without incr in pain to progress to next phase of protocol Baseline: unable at eval  04/01/2021 INITIAL  LONG TERM GOALS:   LTG Name Target Date Goal status  1 To be set at short term evaluation depending on time along protocol Baseline:    2  Baseline:    3  Baseline:    4  Baseline:    5  Baseline:    6  Baseline:    7  Baseline:     PLAN: PT FREQUENCY: 2x/week  PT DURATION: other: 16 weeks  PLANNED INTERVENTIONS: Therapeutic exercises, Therapeutic activity, Neuro Muscular re-education, Balance training, Gait training, Patient/Family education, Joint mobilization, Stair training, Aquatic Therapy, Cryotherapy, Moist heat, Taping, and Manual therapy  PLAN FOR NEXT SESSION: continue per protocol  Harmani Neto C. Melba Araki PT, DPT 03/04/21 9:19 PM

## 2021-03-05 ENCOUNTER — Encounter (HOSPITAL_BASED_OUTPATIENT_CLINIC_OR_DEPARTMENT_OTHER): Payer: Self-pay | Admitting: Physical Therapy

## 2021-03-07 ENCOUNTER — Telehealth: Payer: Self-pay | Admitting: Orthopaedic Surgery

## 2021-03-07 ENCOUNTER — Encounter (HOSPITAL_BASED_OUTPATIENT_CLINIC_OR_DEPARTMENT_OTHER): Payer: Self-pay | Admitting: Orthopaedic Surgery

## 2021-03-07 DIAGNOSIS — F411 Generalized anxiety disorder: Secondary | ICD-10-CM | POA: Diagnosis not present

## 2021-03-07 DIAGNOSIS — F32A Depression, unspecified: Secondary | ICD-10-CM | POA: Diagnosis not present

## 2021-03-07 NOTE — Telephone Encounter (Signed)
Patient called. She would like to talk to [place]. Her call back number is [phone]

## 2021-03-07 NOTE — Telephone Encounter (Signed)
Spoke to patient directly about wound care, showering and incision site. The patient's questions were answered thoroughly and she did not have anymore follow up questions.

## 2021-03-08 ENCOUNTER — Encounter (HOSPITAL_BASED_OUTPATIENT_CLINIC_OR_DEPARTMENT_OTHER): Payer: Self-pay

## 2021-03-08 ENCOUNTER — Encounter (HOSPITAL_BASED_OUTPATIENT_CLINIC_OR_DEPARTMENT_OTHER): Payer: Self-pay | Admitting: *Deleted

## 2021-03-08 ENCOUNTER — Emergency Department (HOSPITAL_BASED_OUTPATIENT_CLINIC_OR_DEPARTMENT_OTHER)
Admission: EM | Admit: 2021-03-08 | Discharge: 2021-03-08 | Disposition: A | Discharge status: Home / Self Care | Payer: BC Managed Care – PPO | Attending: Emergency Medicine | Admitting: Emergency Medicine

## 2021-03-08 ENCOUNTER — Ambulatory Visit (HOSPITAL_BASED_OUTPATIENT_CLINIC_OR_DEPARTMENT_OTHER): Payer: BC Managed Care – PPO | Admitting: Physical Therapy

## 2021-03-08 ENCOUNTER — Other Ambulatory Visit (HOSPITAL_BASED_OUTPATIENT_CLINIC_OR_DEPARTMENT_OTHER): Payer: Self-pay | Admitting: Orthopaedic Surgery

## 2021-03-08 ENCOUNTER — Other Ambulatory Visit: Payer: Self-pay

## 2021-03-08 DIAGNOSIS — Z79899 Other long term (current) drug therapy: Secondary | ICD-10-CM | POA: Diagnosis not present

## 2021-03-08 DIAGNOSIS — Z9660 Presence of unspecified orthopedic joint implant: Secondary | ICD-10-CM | POA: Diagnosis not present

## 2021-03-08 DIAGNOSIS — Z7982 Long term (current) use of aspirin: Secondary | ICD-10-CM | POA: Diagnosis not present

## 2021-03-08 DIAGNOSIS — R112 Nausea with vomiting, unspecified: Secondary | ICD-10-CM

## 2021-03-08 DIAGNOSIS — Z8616 Personal history of COVID-19: Secondary | ICD-10-CM | POA: Diagnosis not present

## 2021-03-08 DIAGNOSIS — J4520 Mild intermittent asthma, uncomplicated: Secondary | ICD-10-CM | POA: Diagnosis not present

## 2021-03-08 DIAGNOSIS — Z87891 Personal history of nicotine dependence: Secondary | ICD-10-CM | POA: Diagnosis not present

## 2021-03-08 DIAGNOSIS — R42 Dizziness and giddiness: Secondary | ICD-10-CM | POA: Diagnosis not present

## 2021-03-08 DIAGNOSIS — R1084 Generalized abdominal pain: Secondary | ICD-10-CM | POA: Diagnosis not present

## 2021-03-08 LAB — URINALYSIS, ROUTINE W REFLEX MICROSCOPIC
Bilirubin Urine: NEGATIVE
Glucose, UA: NEGATIVE mg/dL
Hgb urine dipstick: NEGATIVE
Ketones, ur: NEGATIVE mg/dL
Leukocytes,Ua: NEGATIVE
Nitrite: NEGATIVE
Protein, ur: NEGATIVE mg/dL
Specific Gravity, Urine: 1.029 (ref 1.005–1.030)
pH: 6.5 (ref 5.0–8.0)

## 2021-03-08 LAB — CBC
HCT: 40.7 % (ref 36.0–46.0)
Hemoglobin: 13.9 g/dL (ref 12.0–15.0)
MCH: 30.7 pg (ref 26.0–34.0)
MCHC: 34.2 g/dL (ref 30.0–36.0)
MCV: 89.8 fL (ref 80.0–100.0)
Platelets: 258 10*3/uL (ref 150–400)
RBC: 4.53 MIL/uL (ref 3.87–5.11)
RDW: 12.1 % (ref 11.5–15.5)
WBC: 6.8 10*3/uL (ref 4.0–10.5)
nRBC: 0 % (ref 0.0–0.2)

## 2021-03-08 LAB — COMPREHENSIVE METABOLIC PANEL
ALT: 18 U/L (ref 0–44)
AST: 17 U/L (ref 15–41)
Albumin: 5.2 g/dL — ABNORMAL HIGH (ref 3.5–5.0)
Alkaline Phosphatase: 47 U/L (ref 38–126)
Anion gap: 10 (ref 5–15)
BUN: 21 mg/dL — ABNORMAL HIGH (ref 6–20)
CO2: 28 mmol/L (ref 22–32)
Calcium: 10.8 mg/dL — ABNORMAL HIGH (ref 8.9–10.3)
Chloride: 103 mmol/L (ref 98–111)
Creatinine, Ser: 0.64 mg/dL (ref 0.44–1.00)
GFR, Estimated: >60 mL/min (ref >60)
Glucose, Bld: 105 mg/dL — ABNORMAL HIGH (ref 70–99)
Potassium: 4.2 mmol/L (ref 3.5–5.1)
Sodium: 141 mmol/L (ref 135–145)
Total Bilirubin: 0.4 mg/dL (ref 0.3–1.2)
Total Protein: 8.5 g/dL — ABNORMAL HIGH (ref 6.5–8.1)

## 2021-03-08 LAB — LIPASE, BLOOD: Lipase: 20 U/L (ref 11–51)

## 2021-03-08 LAB — PREGNANCY, URINE: Preg Test, Ur: NEGATIVE

## 2021-03-08 MED ORDER — PROMETHAZINE HCL 25 MG PO TABS: 25.0000 mg | ORAL_TABLET | Freq: Four times a day (QID) | ORAL | 0 refills | Status: DC | PRN

## 2021-03-08 MED ORDER — KETOROLAC TROMETHAMINE 15 MG/ML IJ SOLN
15.0000 mg | Freq: Once | INTRAMUSCULAR | Status: AC
  Administered 2021-03-08: 15 mg via INTRAVENOUS
  Filled 2021-03-08: qty 1

## 2021-03-08 MED ORDER — SODIUM CHLORIDE 0.9 % IV BOLUS
1000.0000 mL | Freq: Once | INTRAVENOUS | Status: AC
  Administered 2021-03-08: 1000 mL via INTRAVENOUS

## 2021-03-08 MED ORDER — SODIUM CHLORIDE 0.9 % IV SOLN
12.5000 mg | Freq: Four times a day (QID) | INTRAVENOUS | Status: DC | PRN
  Administered 2021-03-08: 12.5 mg via INTRAVENOUS
  Filled 2021-03-08: qty 0.5

## 2021-03-08 MED ORDER — PROMETHAZINE HCL 25 MG/ML IJ SOLN
INTRAMUSCULAR | Status: AC
  Administered 2021-03-08: 12.5 mg
  Filled 2021-03-08: qty 1

## 2021-03-08 MED ORDER — TRAMADOL HCL 50 MG PO TABS: 50.0000 mg | ORAL_TABLET | Freq: Two times a day (BID) | ORAL | 0 refills | Status: DC | PRN

## 2021-03-08 MED ORDER — FENTANYL CITRATE PF 50 MCG/ML IJ SOSY
50.0000 ug | PREFILLED_SYRINGE | Freq: Once | INTRAMUSCULAR | Status: AC
  Administered 2021-03-08: 50 ug via INTRAVENOUS
  Filled 2021-03-08: qty 1

## 2021-03-08 MED ORDER — ONDANSETRON HCL 4 MG/2ML IJ SOLN
4.0000 mg | Freq: Once | INTRAMUSCULAR | Status: AC
  Administered 2021-03-08: 4 mg via INTRAVENOUS
  Filled 2021-03-08: qty 2

## 2021-03-08 NOTE — ED Provider Notes (Signed)
[facility] EMERGENCY DEPT Provider Note   CSN: [id number] Arrival date & time: 03/08/21  1246     History Chief Complaint  Patient presents with  . Nausea    Brianna [name] is a 25 y.o. female with a past medical history significant for ADHD, anxiety, asthma, fibromyalgia, GERD, history of gastroparesis who presents to the ED due to decreased p.o. intake, nausea, and vomiting for the past few days.  Patient had a right hip surgery on 10/12 by Dr. Steward Drone.  She notes that over the weekend she has been unable to tolerate any p.o. which she attributes to pain medication. She has been taking over-the-counter pain medications and intermittently taking oxycodone as needed for right hip pain.  Patient states she is concerned that she is dehydrated.  Denies chest pain or shortness of breath. No lower extremity edema. Denies urinary vaginal symptoms.  Admits to generalized abdominal pain.  No treatment prior to arrival.  No Aggravating or alleviating factors.  History obtained from patient and past medical records. No interpreter used during encounter.       Past Medical History:  Diagnosis Date  . Acute recurrent pansinusitis 10/21/2019  . ADHD (attention deficit hyperactivity disorder)    inattentive  . Anemia   . Anxiety   . Asthma   . Bronchitis   . Cat allergy due to both airborne and skin contact 05/01/2016  . Chronic sore throat 05/01/2016  . COVID 2020  . Depression   . Fibromyalgia   . Gastroparesis   . GERD (gastroesophageal reflux disease)   . Hypermobile Ehlers-Danlos syndrome   . Irregular heart rate   . Keratosis pilaris   . Migraine   . Seasonal allergic rhinitis 03/11/2014    Patient Active Problem List   Diagnosis Date Noted  . Headache 11/30/2020  . Positive ANA (antinuclear antibody) 11/15/2020  . Fibromyalgia 11/02/2020  . Hair loss 11/02/2020  . Muscle spasticity 10/22/2020  . Rash and other nonspecific skin eruption 10/22/2020  .  Paresthesia of bilateral legs 10/06/2020  . Myalgia 10/06/2020  . Chronic pelvic pain in female 10/06/2020  . Chronic abdominal pain 10/06/2020  . IBS (irritable bowel syndrome) 08/02/2020  . Pelvic floor dysfunction 08/02/2020  . Irritant dermatitis 08/02/2020  . Gastroesophageal reflux disease 06/28/2020  . Severe recurrent major depression without psychotic features (HCC) 06/28/2020  . Polyarthralgia 06/28/2020  . 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 Tachycardia 06/28/2020  . History of COVID-19 06/28/2020  . 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 Recurrent major depressive disorder, in full remission (HCC) 06/28/2020  . Atypical chest pain 06/22/2020  . Dizziness 06/22/2020  . SOB (shortness of breath) 06/22/2020  . Palpitations 06/14/2020  . Anxiety 05/04/2020  . Internal and external hemorrhoids without complication 04/29/2020  . ETD (Eustachian tube dysfunction), bilateral 10/21/2019  . Acne vulgaris 03/11/2014  . Mild intermittent asthma without complication 03/11/2014  . Seasonal allergies 03/11/2014    Past Surgical History:  Procedure Laterality Date  . ESOPHAGOGASTRODUODENOSCOPY ENDOSCOPY     03/2020  . JOINT REPLACEMENT    . SMART PILL PROCEDURE     12/2020  . TYMPANOSTOMY TUBE PLACEMENT Bilateral   . WISDOM TOOTH EXTRACTION       OB History     Gravida  1   Para  0   Term      Preterm  0   AB  1   Living  0      SAB  IAB  1   Ectopic  0   Multiple      Live Births  0           Family History  Problem Relation Age of Onset  . Diabetes Mother   . Polycystic ovary syndrome Mother   . GER disease Father   . Heart disease Paternal Uncle     Social History   Tobacco Use  . Smoking status: Former    Types: E-cigarettes, Cigarettes    Quit date: 2021    Years since quitting: 1.7  . Smokeless tobacco: Never  Vaping Use  . Vaping Use: Former  . Substances:  Synthetic cannabinoids  Substance Use Topics  . Alcohol use: Not Currently  . Drug use: Yes    Types: Marijuana    Comment: last use 7 days ago    Home Medications Prior to Admission medications   Medication Sig Start Date End Date Taking? Authorizing Provider  aspirin EC 325 MG tablet Take 1 tablet (325 mg total) by mouth daily. 02/11/21  Yes [name], MD  Cholecalciferol (VITAMIN D3 PO) Take 1 capsule by mouth every 3 (three) days.   Yes [provider]  dicyclomine (BENTYL) 10 MG capsule Take 10 mg by mouth 3 (three) times daily. 12/29/20  Yes [provider]  Ferrous Sulfate Dried (EQ SLOW-RELEASE IRON) 45 MG TBCR Take 1 tablet by mouth every 3 (three) days.   Yes [provider]  fluticasone (FLONASE) 50 MCG/ACT nasal spray SPRAY 1 SPRAY INTO BOTH NOSTRILS DAILY. Patient taking differently: Place 1 spray into both nostrils 2 (two) times daily. 12/29/20  Yes Masters, Katie, DO  loratadine (CLARITIN) 10 MG tablet Take 1 tablet (10 mg total) by mouth daily. 11/09/20  Yes Dolan Amen, MD  Multiple Vitamins-Minerals (ADULT GUMMY PO) Take 2 capsules by mouth daily.   Yes [provider]  ondansetron (ZOFRAN ODT) 4 MG disintegrating tablet Take 1 tablet (4 mg total) by mouth every 8 (eight) hours as needed for nausea or vomiting. 11/30/20  Yes Everlena Cooper, Adam R, DO  pregabalin (LYRICA) 25 MG capsule Take 25 mg by mouth daily. 10/26/20  Yes [provider]  promethazine (PHENERGAN) 25 MG tablet Take 1 tablet (25 mg total) by mouth every 6 (six) hours as needed for nausea or vomiting. 03/08/21  Yes Madason Rauls, Merla Riches, PA-C  Prucalopride Succinate 2 MG TABS Take 2 mg by mouth daily.   Yes [provider]  RABEprazole (ACIPHEX) 20 MG tablet Take 20 mg by mouth 2 (two) times daily.   Yes [provider]  traMADol (ULTRAM) 50 MG tablet Take 1 tablet (50 mg total) by mouth every 12 (twelve) hours as needed for severe pain. 03/08/21    [name], MD  Acetylcysteine (NAC) 600 MG CAPS Take 1 capsule (600 mg total) by mouth 2 (two) times daily. Patient not taking: Reported on 03/08/2021 01/11/21   Raulkar, Drema Pry, MD  albuterol (VENTOLIN HFA) 108 (90 Base) MCG/ACT inhaler Inhale 1 puff into the lungs every 6 (six) hours as needed for shortness of breath or wheezing. Patient not taking: Reported on 03/08/2021 06/23/20   [provider]  OVER THE COUNTER MEDICATION Apply 1 application topically daily as needed (pain). CBD menthol muscle rub    [provider]    Allergies    Clindamycin  Review of Systems   Review of Systems  Constitutional:  Positive for appetite change and fatigue. Negative for chills and fever.  Respiratory:  Negative for shortness of breath.   Cardiovascular:  Negative for chest pain.  Gastrointestinal:  Positive for abdominal pain, nausea and vomiting.  Neurological:  Positive for light-headedness.  All other systems reviewed and are negative.  Physical Exam Updated Vital Signs BP 115/82   Pulse 78   Temp 97.9 F (36.6 C)   Resp 18   Ht 5\' 4"  (1.626 m)   Wt 45.4 kg   LMP 02/27/2021 Comment: Urine preg test negative 03/02/21  SpO2 100%   BMI 17.18 kg/m   Physical Exam Vitals and nursing note reviewed.  Constitutional:      General: She is not in acute distress.    Appearance: She is not ill-appearing.  HENT:     Head: Normocephalic.  Eyes:     Pupils: Pupils are equal, round, and reactive to light.  Cardiovascular:     Rate and Rhythm: Normal rate and regular rhythm.     Pulses: Normal pulses.     Heart sounds: Normal heart sounds. No murmur heard.   No friction rub. No gallop.  Pulmonary:     Effort: Pulmonary effort is normal.     Breath sounds: Normal breath sounds.  Abdominal:     General: Abdomen is flat. There is no distension.     Palpations: Abdomen is soft.     Tenderness: There is abdominal tenderness. There is no guarding or rebound.      Comments: Generalized abdominal tenderness without rebound or guarding.   Musculoskeletal:        General: Normal range of motion.     Cervical back: Neck supple.     Comments: Patient in hip brace. No signs of infection around bandages on right hip. No lower extremity edema.   Skin:    General: Skin is warm and dry.  Neurological:     General: No focal deficit present.     Mental Status: She is alert.  Psychiatric:        Mood and Affect: Mood normal.        Behavior: Behavior normal.    ED Results / Procedures / Treatments   Labs (all labs ordered are listed, but only abnormal results are displayed) Labs Reviewed  COMPREHENSIVE METABOLIC PANEL - Abnormal; Notable for the following components:      Result Value   Glucose, Bld 105 (*)    BUN 21 (*)    Calcium 10.8 (*)    Total Protein 8.5 (*)    Albumin 5.2 (*)    All other components within normal limits  LIPASE, BLOOD  CBC  URINALYSIS, ROUTINE W REFLEX MICROSCOPIC  PREGNANCY, URINE    EKG None  Radiology No results found.  Procedures Procedures   Medications Ordered in ED Medications  sodium chloride 0.9 % bolus 1,000 mL (0 mLs Intravenous Stopped 03/08/21 1610)  ondansetron (ZOFRAN) injection 4 mg (4 mg Intravenous Given 03/08/21 1500)  fentaNYL (SUBLIMAZE) injection 50 mcg (50 mcg Intravenous Given 03/08/21 1534)  ketorolac (TORADOL) 15 MG/ML injection 15 mg (15 mg Intravenous Given 03/08/21 1649)  promethazine (PHENERGAN) 25 MG/ML injection (12.5 mg  Given 03/08/21 1651)    ED Course  I have reviewed the triage vital signs and the nursing notes.  Pertinent labs & imaging results that were available during my care of the patient were reviewed by me and considered in my medical decision making (see chart for details).  Clinical Course as of 03/08/21 1919  Tue Mar 08, 2021  1857 Attempted po challenge  and informed by RN patient vomiting at bedside. EKG ordered. If normal Qtc, will give droperidol.  [CA]     Clinical Course User Index [CA] Mannie Stabile, PA-C   MDM Rules/Calculators/A&P                           25 year old female presents to the ED due to decreased p.o. intake, nausea, and vomiting for the past few days.  Patient had a recent right hip surgery on 10/12.  She also endorses lightheadedness.  Upon arrival, patient mildly tachycardic 102 with otherwise unremarkable vitals.  Patient in no acute distress.  Reassuring physical exam.  Generalized abdominal tenderness without rebound or guarding.  Low suspicion for acute abdomen.  Labs ordered at triage.  IV fluids and Zofran given.  CBC unremarkable.  No leukocytosis and normal hemoglobin.  Lipase normal.  CMP significant for elevated BUN at 21.  Normal creatinine.  UA negative for signs of infection.  Pregnancy test negative.  7:16 PM called to bedside due to concerns about EKG and further nausea medication after RN reported inability to tolerate po. Patient actually tolerating p.o. without difficulty. Patient is ready to be discharged.  Patient discharged with Phenergan.  Advised patient follow-up with GI doctor for further evaluation. No signs of post-operative infection. Strict ED precautions discussed with patient. Patient states understanding and agrees to plan. Patient discharged home in no acute distress and stable vitals  Final Clinical Impression(s) / ED Diagnoses Final diagnoses:  Nausea and vomiting, unspecified vomiting type    Rx / DC Orders ED Discharge Orders          Ordered    promethazine (PHENERGAN) 25 MG tablet  Every 6 hours PRN        03/08/21 1915             Mannie Stabile, PA-C 03/08/21 1920    Virgina Norfolk, DO 03/08/21 2345

## 2021-03-08 NOTE — ED Notes (Signed)
Patient and patient's mother questioning why I was going to obtain an EKG.  Mother states she thought her daughter was getting discharged.  PA at bedside talking with patient.  PA will discontinue EKG

## 2021-03-08 NOTE — Discharge Instructions (Addendum)
It was a pleasure taking care of you today.  As discussed, all of your labs were reassuring.  You were given 2 doses of nausea medication here in the ED.  I am sending you home with nausea medication.  Take as needed for nausea.  Please follow-up with your GI doctor for further evaluation.  Return to the ER for new or worsening symptoms.

## 2021-03-08 NOTE — ED Notes (Signed)
Ice chips and crackers given for po challenge.

## 2021-03-08 NOTE — ED Triage Notes (Signed)
Pt had surgery last week and has been having trouble with hydration, poor appetite, constant nausea. Went to PT, was off balance and pale.

## 2021-03-09 DIAGNOSIS — F411 Generalized anxiety disorder: Secondary | ICD-10-CM | POA: Diagnosis not present

## 2021-03-10 ENCOUNTER — Encounter (HOSPITAL_BASED_OUTPATIENT_CLINIC_OR_DEPARTMENT_OTHER): Payer: BC Managed Care – PPO | Admitting: Orthopaedic Surgery

## 2021-03-10 ENCOUNTER — Telehealth (HOSPITAL_BASED_OUTPATIENT_CLINIC_OR_DEPARTMENT_OTHER): Payer: Self-pay | Admitting: Orthopaedic Surgery

## 2021-03-10 ENCOUNTER — Other Ambulatory Visit: Payer: Self-pay

## 2021-03-10 ENCOUNTER — Ambulatory Visit (INDEPENDENT_AMBULATORY_CARE_PROVIDER_SITE_OTHER): Payer: BC Managed Care – PPO | Admitting: Orthopaedic Surgery

## 2021-03-10 ENCOUNTER — Ambulatory Visit (HOSPITAL_BASED_OUTPATIENT_CLINIC_OR_DEPARTMENT_OTHER): Payer: BC Managed Care – PPO | Admitting: Physical Therapy

## 2021-03-10 ENCOUNTER — Encounter (HOSPITAL_BASED_OUTPATIENT_CLINIC_OR_DEPARTMENT_OTHER): Payer: Self-pay | Admitting: Physical Therapy

## 2021-03-10 DIAGNOSIS — R262 Difficulty in walking, not elsewhere classified: Secondary | ICD-10-CM | POA: Diagnosis not present

## 2021-03-10 DIAGNOSIS — F32A Depression, unspecified: Secondary | ICD-10-CM | POA: Diagnosis not present

## 2021-03-10 DIAGNOSIS — M25551 Pain in right hip: Secondary | ICD-10-CM

## 2021-03-10 DIAGNOSIS — M6281 Muscle weakness (generalized): Secondary | ICD-10-CM | POA: Diagnosis not present

## 2021-03-10 DIAGNOSIS — S73191A Other sprain of right hip, initial encounter: Secondary | ICD-10-CM | POA: Diagnosis not present

## 2021-03-10 DIAGNOSIS — F411 Generalized anxiety disorder: Secondary | ICD-10-CM | POA: Diagnosis not present

## 2021-03-10 DIAGNOSIS — S73191D Other sprain of right hip, subsequent encounter: Secondary | ICD-10-CM

## 2021-03-10 NOTE — Progress Notes (Signed)
Post Operative Evaluation    Procedure/Date of Surgery: Right hip arthroscopy with labral repair, pincer debridement 03/02/21  Interval History:   Brianna [name] presents postop 1 week after right hip arthroscopy with labral repair and pincer debridement.  Overall she is doing well.  She did have a brief bout of nausea that was likely related to narcotic pain medication.  This has been subsequently transition to tramadol and is significantly better.  There is some continued pain and muscle spasm particularly about the anterior lateral hip.   PMH/PSH/Family History/Social History/Meds/Allergies:    Past Medical History:  Diagnosis Date   Acute recurrent pansinusitis 10/21/2019   ADHD (attention deficit hyperactivity disorder)    inattentive   Anemia    Anxiety    Asthma    Bronchitis    Cat allergy due to both airborne and skin contact 05/01/2016   Chronic sore throat 05/01/2016   COVID 2020   Depression    Fibromyalgia    Gastroparesis    GERD (gastroesophageal reflux disease)    Hypermobile Ehlers-Danlos syndrome    Irregular heart rate    Keratosis pilaris    Migraine    Seasonal allergic rhinitis 03/11/2014   Past Surgical History:  Procedure Laterality Date   ESOPHAGOGASTRODUODENOSCOPY ENDOSCOPY     03/2020   JOINT REPLACEMENT     SMART PILL PROCEDURE     12/2020   TYMPANOSTOMY TUBE PLACEMENT Bilateral    WISDOM TOOTH EXTRACTION     Social History   Socioeconomic History   Marital status: Single    Spouse name: Not on file   Number of children: Not on file   Years of education: Not on file   Highest education level: Not on file  Occupational History   Not on file  Tobacco Use   Smoking status: Former    Types: E-cigarettes, Cigarettes    Quit date: 2021    Years since quitting: 1.8   Smokeless tobacco: Never  Vaping Use   Vaping Use: Former   Substances: Financial trader  Substance and Sexual Activity   Alcohol  use: Not Currently   Drug use: Yes    Types: Marijuana    Comment: last use 7 days ago   Sexual activity: Yes    Birth control/protection: None  Other Topics Concern   Not on file  Social History Narrative   Right handed   Social Determinants of Health   Financial Resource Strain: Not on file  Food Insecurity: Not on file  Transportation Needs: Not on file  Physical Activity: Not on file  Stress: Not on file  Social Connections: Not on file   Family History  Problem Relation Age of Onset   Diabetes Mother    Polycystic ovary syndrome Mother    GER disease Father    Heart disease Paternal Uncle    Allergies  Allergen Reactions   Clindamycin Hives, Swelling and Rash   Current Outpatient Medications  Medication Sig Dispense Refill   traMADol (ULTRAM) 50 MG tablet Take 1 tablet (50 mg total) by mouth every 12 (twelve) hours as needed for severe pain. 30 tablet 0   Acetylcysteine (NAC) 600 MG CAPS Take 1 capsule (600 mg total) by mouth 2 (two) times daily. (Patient not taking: Reported on 03/08/2021) 60 capsule 0   albuterol (VENTOLIN HFA)  108 (90 Base) MCG/ACT inhaler Inhale 1 puff into the lungs every 6 (six) hours as needed for shortness of breath or wheezing. (Patient not taking: Reported on 03/08/2021)     aspirin EC 325 MG tablet Take 1 tablet (325 mg total) by mouth daily. 30 tablet 0   Cholecalciferol (VITAMIN D3 PO) Take 1 capsule by mouth every 3 (three) days.     dicyclomine (BENTYL) 10 MG capsule Take 10 mg by mouth 3 (three) times daily.     Ferrous Sulfate Dried (EQ SLOW-RELEASE IRON) 45 MG TBCR Take 1 tablet by mouth every 3 (three) days.     fluticasone (FLONASE) 50 MCG/ACT nasal spray SPRAY 1 SPRAY INTO BOTH NOSTRILS DAILY. (Patient taking differently: Place 1 spray into both nostrils 2 (two) times daily.) 16 mL 1   loratadine (CLARITIN) 10 MG tablet Take 1 tablet (10 mg total) by mouth daily. 90 tablet 1   Multiple Vitamins-Minerals (ADULT GUMMY PO) Take 2  capsules by mouth daily.     ondansetron (ZOFRAN ODT) 4 MG disintegrating tablet Take 1 tablet (4 mg total) by mouth every 8 (eight) hours as needed for nausea or vomiting. 20 tablet 5   OVER THE COUNTER MEDICATION Apply 1 application topically daily as needed (pain). CBD menthol muscle rub     pregabalin (LYRICA) 25 MG capsule Take 25 mg by mouth daily.     promethazine (PHENERGAN) 25 MG tablet Take 1 tablet (25 mg total) by mouth every 6 (six) hours as needed for nausea or vomiting. 30 tablet 0   Prucalopride Succinate 2 MG TABS Take 2 mg by mouth daily.     RABEprazole (ACIPHEX) 20 MG tablet Take 20 mg by mouth 2 (two) times daily.     No current facility-administered medications for this visit.   No results found.  Review of Systems:   A ROS was performed including pertinent positives and negatives as documented in the HPI.   Musculoskeletal Exam:     Right hip incisions are well healing.  Brace is in place clean dry and intact.  Sensation is intact in all distributions distal to the ankle.  She is able to extend and flex at the knee.  Imaging:    None  I personally reviewed and interpreted the radiographs.   Assessment:   25 year old female status post right hip arthroscopy with labral repair 1 week prior, overall doing well.  I would like to continue the tramadol at this time as that is a better pain medication for her gut motility.  She will continue to rehab according to the protocol with physical therapy.  Plan :    -Return to clinic in 2 weeks for reassessment    I personally saw and evaluated the patient, and participated in the management and treatment plan.  [name], MD Attending Physician, Orthopedic Surgery  This document was dictated using Dragon voice recognition software. A reasonable attempt at proof reading has been made to minimize errors.

## 2021-03-10 NOTE — Telephone Encounter (Signed)
Spoke to pt directly and was able to schedule her an appointment 03/10/2021

## 2021-03-10 NOTE — Therapy (Signed)
OUTPATIENT PHYSICAL THERAPY LOWER EXTREMITY Re-Evaluation for post-op treatments   Patient Name: Brianna [name] MRN: [id number] DOB:Aug 20, 1995, 25 y.o., female Today's Date: 03/10/2021   PT End of Session - 03/10/21 1232     Visit Number 4    Number of Visits 35    Date for PT Re-Evaluation 06/24/21    Authorization Type BCBS    PT Start Time 1230    PT Stop Time 1305    PT Time Calculation (min) 35 min    Activity Tolerance Patient tolerated treatment well    Behavior During Therapy [facility] for tasks assessed/performed             Past Medical History:  Diagnosis Date   Acute recurrent pansinusitis 10/21/2019   ADHD (attention deficit hyperactivity disorder)    inattentive   Anemia    Anxiety    Asthma    Bronchitis    Cat allergy due to both airborne and skin contact 05/01/2016   Chronic sore throat 05/01/2016   COVID 2020   Depression    Fibromyalgia    Gastroparesis    GERD (gastroesophageal reflux disease)    Hypermobile Ehlers-Danlos syndrome    Irregular heart rate    Keratosis pilaris    Migraine    Seasonal allergic rhinitis 03/11/2014   Past Surgical History:  Procedure Laterality Date   ESOPHAGOGASTRODUODENOSCOPY ENDOSCOPY     03/2020   JOINT REPLACEMENT     SMART PILL PROCEDURE     12/2020   TYMPANOSTOMY TUBE PLACEMENT Bilateral    WISDOM TOOTH EXTRACTION     Patient Active Problem List   Diagnosis Date Noted   Headache 11/30/2020   Positive ANA (antinuclear antibody) 11/15/2020   Fibromyalgia 11/02/2020   Hair loss 11/02/2020   Muscle spasticity 10/22/2020   Rash and other nonspecific skin eruption 10/22/2020   Paresthesia of bilateral legs 10/06/2020   Myalgia 10/06/2020   Chronic pelvic pain in female 10/06/2020   Chronic abdominal pain 10/06/2020   IBS (irritable bowel syndrome) 08/02/2020   Pelvic floor dysfunction 08/02/2020   Irritant dermatitis 08/02/2020   Gastroesophageal reflux disease 06/28/2020   Severe recurrent  major depression without psychotic features (HCC) 06/28/2020   Polyarthralgia 06/28/2020   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 06/28/2020   Tachycardia 06/28/2020   History of COVID-19 06/28/2020   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 deficit 06/28/2020   Recurrent major depressive disorder, in full remission (HCC) 06/28/2020   Atypical chest pain 06/22/2020   Dizziness 06/22/2020   SOB (shortness of breath) 06/22/2020   Palpitations 06/14/2020   Anxiety 05/04/2020   Internal and external hemorrhoids without complication 04/29/2020   ETD (Eustachian tube dysfunction), bilateral 10/21/2019   Acne vulgaris 03/11/2014   Mild intermittent asthma without complication 03/11/2014   Seasonal allergies 03/11/2014    PCP: Cathe Mons, MD  REFERRING PROVIDER: Rudene Christians, DO  REFERRING DIAG: [name], MD  THERAPY DIAG:  Pain in right hip  Difficulty in walking, not elsewhere classified  Muscle weakness (generalized)  ONSET DATE: DOS 10/12  SUBJECTIVE:   SUBJECTIVE STATEMENT: Drove to [facility] for apt yesterday which was hard. Stitches are burning and itching. Hard to tell if it is the hip hurting or if the bandage is digging in when it creases.   PERTINENT HISTORY: EDS, chronic abdominal/pelvic pain  PAIN:  Are you having pain? Yes VAS scale: 6/10 Pain location: Rt hip Pain orientation: Right  PAIN TYPE: aching Pain description: constant  Aggravating factors: moving  from sit to stand Relieving factors: lay down with leg propped up  PRECAUTIONS: Rt hip labral repair  WEIGHT BEARING RESTRICTIONS Yes NWB until 10/26, followed by WBAT ; hip flexion limited to 90 deg passively, no active hip flexion activation  FALLS:  Has patient fallen in last 6 months? No  LIVING ENVIRONMENT: Lives with:  has a roommate Lives in: House/apartment Stairs: Yes; 2 stories- bedroom upstairs Has following equipment at home:  Crutches  OCCUPATION: restaurant server  PLOF: Independent  PATIENT GOALS get back to normal, decrease pain, able to exercise for EDS stability   OBJECTIVE:    COGNITION:  Overall cognitive status: Within functional limits for tasks assessed     SENSATION:  Some numbness around hip as expected post op  MUSCLE LENGTH: Hypermobile with increased flexibility grossly  POSTURE:  Arrived with kyphotic posture on crutches as they were too short.   LE AROM/PROM:  A/PROM Right 03/10/2021 Left 03/10/2021  Hip flexion 70   Hip extension    Hip abduction    Hip adduction    Hip internal rotation    Hip external rotation    Knee flexion    Knee extension    Ankle dorsiflexion    Ankle plantarflexion    Ankle inversion    Ankle eversion     (Blank rows = not tested)  LE MMT:  MMT Right 03/10/2021 Left 03/10/2021  Hip flexion    Hip extension    Hip abduction    Hip adduction    Hip internal rotation    Hip external rotation    Knee flexion    Knee extension    Ankle dorsiflexion    Ankle plantarflexion    Ankle inversion    Ankle eversion     (Blank rows = not tested)   GAIT: Modified independence with bil axillary crutches    TODAY'S TREATMENT: 10/20  Manual PROM hip flexion, ER, IR, AAROM abduction in supine  Quad sets  Ankle pumps  Isometric hip extension in supine Ice 10 min end of session  Eval: Manual: passive hip flexion, rotation within protocol limitations  Ankle pumps Quad set Ab set Adjusted crutches to proper height  PATIENT EDUCATION:  Education details: Teacher, music of condition, POC, HEP, exercise form/rationale Person educated: Patient Education method: Programmer, multimedia, Demonstration, Tactile cues, Verbal cues, and Handouts Education comprehension: verbalized understanding, returned demonstration, verbal cues required, tactile cues required, and needs further education   HOME EXERCISE PROGRAM: [id number]  ASSESSMENT:  CLINICAL  IMPRESSION: Pt able to achieve passive PROM ranges but reports moderate discomfort. Empty end feel at allowed ranges.    Rush protocol with exception that WB is toe-touch for 2 weeks and then prog as tolerated. [email or web address]  REHAB POTENTIAL: Good  CLINICAL DECISION MAKING: Evolving/moderate complexity  EVALUATION COMPLEXITY: Moderate   GOALS: Goals reviewed with patient? Yes  SHORT TERM GOALS:  STG Name Target Date Goal status  1 Begin walking household distances without AD Baseline:  03/25/21 INITIAL  2 Hip PROM 80% of Lt  Baseline: limited to 90 deg at eval 04/01/21 INITIAL  3 30 second 5TSTS without incr in pain to progress to next phase of protocol Baseline: unable at eval  04/07/2021 INITIAL                       LONG TERM GOALS:   LTG Name Target Date Goal status  1 To be set at short term evaluation depending on time along protocol Baseline:  2  Baseline:    3  Baseline:    4  Baseline:    5  Baseline:    6  Baseline:    7  Baseline:     PLAN: PT FREQUENCY: 2x/week  PT DURATION: other: 16 weeks  PLANNED INTERVENTIONS: Therapeutic exercises, Therapeutic activity, Neuro Muscular re-education, Balance training, Gait training, Patient/Family education, Joint mobilization, Stair training, Aquatic Therapy, Cryotherapy, Moist heat, Taping, and Manual therapy  PLAN FOR NEXT SESSION: continue per protocol  Aanchal Cope C. Sephiroth Mcluckie PT, DPT 03/10/21 1:02 PM

## 2021-03-11 ENCOUNTER — Ambulatory Visit (INDEPENDENT_AMBULATORY_CARE_PROVIDER_SITE_OTHER): Payer: BC Managed Care – PPO | Admitting: Orthopaedic Surgery

## 2021-03-11 ENCOUNTER — Telehealth (HOSPITAL_BASED_OUTPATIENT_CLINIC_OR_DEPARTMENT_OTHER): Payer: Self-pay | Admitting: Orthopaedic Surgery

## 2021-03-11 ENCOUNTER — Other Ambulatory Visit (HOSPITAL_BASED_OUTPATIENT_CLINIC_OR_DEPARTMENT_OTHER): Payer: Self-pay

## 2021-03-11 DIAGNOSIS — S73191D Other sprain of right hip, subsequent encounter: Secondary | ICD-10-CM

## 2021-03-11 MED ORDER — SCOPOLAMINE 1 MG/3DAYS TD PT72
1.0000 | MEDICATED_PATCH | TRANSDERMAL | 0 refills | Status: DC
  Filled 2021-03-11: qty 4, 12d supply, fill #0

## 2021-03-11 MED ORDER — SCOPOLAMINE 1 MG/3DAYS TD PT72: 1.0000 | MEDICATED_PATCH | TRANSDERMAL | Status: DC

## 2021-03-11 NOTE — Addendum Note (Signed)
Addended by: Benancio Deeds on: 03/11/2021 10:37 AM   Modules accepted: Orders

## 2021-03-11 NOTE — Progress Notes (Signed)
Post Operative Evaluation    Procedure/Date of Surgery: Right hip arthroscopy with labral repair, pincer debridement 03/02/21  Interval History:   Brianna [name] presents today as she is concerned about wound dehiscence about her portal sites.  She does states that there is some mild bleeding and some mild opening about the portal particularly laterally.  She is very concerned as she is having a very difficult time with her GI system right now and has had diarrhea all morning.  PMH/PSH/Family History/Social History/Meds/Allergies:    Past Medical History:  Diagnosis Date   Acute recurrent pansinusitis 10/21/2019   ADHD (attention deficit hyperactivity disorder)    inattentive   Anemia    Anxiety    Asthma    Bronchitis    Cat allergy due to both airborne and skin contact 05/01/2016   Chronic sore throat 05/01/2016   COVID 2020   Depression    Fibromyalgia    Gastroparesis    GERD (gastroesophageal reflux disease)    Hypermobile Ehlers-Danlos syndrome    Irregular heart rate    Keratosis pilaris    Migraine    Seasonal allergic rhinitis 03/11/2014   Past Surgical History:  Procedure Laterality Date   ESOPHAGOGASTRODUODENOSCOPY ENDOSCOPY     03/2020   JOINT REPLACEMENT     LABRAL REPAIR Right 03/02/2021   Procedure: RIGHT HIP ARTHROSCOPY WITH LABRAL REPAIR AND PINCE DEBRIDEMENT;  Surgeon: [name], MD;  Location: [facility] OR;  Service: Orthopedics;  Laterality: Right;   SMART PILL PROCEDURE     12/2020   TYMPANOSTOMY TUBE PLACEMENT Bilateral    WISDOM TOOTH EXTRACTION     Social History   Socioeconomic History   Marital status: Single    Spouse name: Not on file   Number of children: Not on file   Years of education: Not on file   Highest education level: Not on file  Occupational History   Not on file  Tobacco Use   Smoking status: Former    Types: E-cigarettes, Cigarettes    Quit date: 2021    Years since quitting: 1.8    Smokeless tobacco: Never  Vaping Use   Vaping Use: Former   Substances: Financial trader  Substance and Sexual Activity   Alcohol use: Not Currently   Drug use: Yes    Types: Marijuana    Comment: last use 7 days ago   Sexual activity: Yes    Birth control/protection: None  Other Topics Concern   Not on file  Social History Narrative   Right handed   Social Determinants of Health   Financial Resource Strain: Not on file  Food Insecurity: Not on file  Transportation Needs: Not on file  Physical Activity: Not on file  Stress: Not on file  Social Connections: Not on file   Family History  Problem Relation Age of Onset   Diabetes Mother    Polycystic ovary syndrome Mother    GER disease Father    Heart disease Paternal Uncle    Allergies  Allergen Reactions   Clindamycin Hives, Swelling and Rash   Current Outpatient Medications  Medication Sig Dispense Refill   traMADol (ULTRAM) 50 MG tablet Take 1 tablet (50 mg total) by mouth every 12 (twelve) hours as needed for severe pain. 30 tablet 0   Acetylcysteine (NAC) 600 MG CAPS Take  1 capsule (600 mg total) by mouth 2 (two) times daily. (Patient not taking: Reported on 03/08/2021) 60 capsule 0   albuterol (VENTOLIN HFA) 108 (90 Base) MCG/ACT inhaler Inhale 1 puff into the lungs every 6 (six) hours as needed for shortness of breath or wheezing. (Patient not taking: Reported on 03/08/2021)     aspirin EC 325 MG tablet Take 1 tablet (325 mg total) by mouth daily. 30 tablet 0   Cholecalciferol (VITAMIN D3 PO) Take 1 capsule by mouth every 3 (three) days.     dicyclomine (BENTYL) 10 MG capsule Take 10 mg by mouth 3 (three) times daily.     Ferrous Sulfate Dried (EQ SLOW-RELEASE IRON) 45 MG TBCR Take 1 tablet by mouth every 3 (three) days.     fluticasone (FLONASE) 50 MCG/ACT nasal spray SPRAY 1 SPRAY INTO BOTH NOSTRILS DAILY. (Patient taking differently: Place 1 spray into both nostrils 2 (two) times daily.) 16 mL 1    loratadine (CLARITIN) 10 MG tablet Take 1 tablet (10 mg total) by mouth daily. 90 tablet 1   Multiple Vitamins-Minerals (ADULT GUMMY PO) Take 2 capsules by mouth daily.     ondansetron (ZOFRAN ODT) 4 MG disintegrating tablet Take 1 tablet (4 mg total) by mouth every 8 (eight) hours as needed for nausea or vomiting. 20 tablet 5   OVER THE COUNTER MEDICATION Apply 1 application topically daily as needed (pain). CBD menthol muscle rub     pregabalin (LYRICA) 25 MG capsule Take 25 mg by mouth daily.     promethazine (PHENERGAN) 25 MG tablet Take 1 tablet (25 mg total) by mouth every 6 (six) hours as needed for nausea or vomiting. 30 tablet 0   Prucalopride Succinate 2 MG TABS Take 2 mg by mouth daily.     RABEprazole (ACIPHEX) 20 MG tablet Take 20 mg by mouth 2 (two) times daily.     Current Facility-Administered Medications  Medication Dose Route Frequency Provider Last Rate Last Admin   scopolamine (TRANSDERM-SCOP) 1 MG/3DAYS 1.5 mg  1 patch Transdermal Q72H [name], MD       No results found.  Review of Systems:   A ROS was performed including pertinent positives and negatives as documented in the HPI.   Musculoskeletal Exam:     Right hip incisions are well healing.  There is a mild opening about the lateral incision.  This was replaced with a Steri-Strip.  Imaging:    None  I personally reviewed and interpreted the radiographs.   Assessment:   25 year old female status post right hip arthroscopy.  She is having a hard time with nutrition at this point and is not able to eat food without either diarrhea or nausea.  At this time I discussed that I would like to prescribe a scopolamine patch to hopefully assist with her nutrition as this will help her entire recovery as well as her wound healing.  I will also send a message to her GI doctor Dr. Beverly Gust in hopes that he can be of assistance as well.  Plan :    -Return in 2 weeks as scheduled    I personally saw and  evaluated the patient, and participated in the management and treatment plan.  [name], MD Attending Physician, Orthopedic Surgery  This document was dictated using Dragon voice recognition software. A reasonable attempt at proof reading has been made to minimize errors.

## 2021-03-11 NOTE — Telephone Encounter (Signed)
Spoke to patient directly 

## 2021-03-13 NOTE — Therapy (Signed)
OUTPATIENT PHYSICAL THERAPY LOWER EXTREMITY Re-Evaluation for post-op treatments   Patient Name: Brianna [name] MRN: [id number] DOB:[birth year], 25 y.o., female Today's Date: 03/14/2021   PT End of Session - 03/14/21 1655     Visit Number 5    Number of Visits 35    Date for PT Re-Evaluation 06/24/21    Authorization Type BCBS    PT Start Time 1654    PT Stop Time 1725    PT Time Calculation (min) 31 min    Activity Tolerance Patient tolerated treatment well    Behavior During Therapy [facility] for tasks assessed/performed              Past Medical History:  Diagnosis Date   Acute recurrent pansinusitis 10/21/2019   ADHD (attention deficit hyperactivity disorder)    inattentive   Anemia    Anxiety    Asthma    Bronchitis    Cat allergy due to both airborne and skin contact 05/01/2016   Chronic sore throat 05/01/2016   COVID 2020   Depression    Fibromyalgia    Gastroparesis    GERD (gastroesophageal reflux disease)    Hypermobile Ehlers-Danlos syndrome    Irregular heart rate    Keratosis pilaris    Migraine    Seasonal allergic rhinitis 03/11/2014   Past Surgical History:  Procedure Laterality Date   ESOPHAGOGASTRODUODENOSCOPY ENDOSCOPY     03/2020   JOINT REPLACEMENT     LABRAL REPAIR Right 03/02/2021   Procedure: RIGHT HIP ARTHROSCOPY WITH LABRAL REPAIR AND PINCE DEBRIDEMENT;  Surgeon: [name], MD;  Location: [facility] OR;  Service: Orthopedics;  Laterality: Right;   SMART PILL PROCEDURE     12/2020   TYMPANOSTOMY TUBE PLACEMENT Bilateral    WISDOM TOOTH EXTRACTION     Patient Active Problem List   Diagnosis Date Noted   Headache 11/30/2020   Positive ANA (antinuclear antibody) 11/15/2020   Fibromyalgia 11/02/2020   Hair loss 11/02/2020   Muscle spasticity 10/22/2020   Rash and other nonspecific skin eruption 10/22/2020   Paresthesia of bilateral legs 10/06/2020   Myalgia 10/06/2020   Chronic pelvic pain in female 10/06/2020   Chronic  abdominal pain 10/06/2020   IBS (irritable bowel syndrome) 08/02/2020   Pelvic floor dysfunction 08/02/2020   Irritant dermatitis 08/02/2020   Gastroesophageal reflux disease 06/28/2020   Severe recurrent major depression without psychotic features (HCC) 06/28/2020   Polyarthralgia 06/28/2020   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 06/28/2020   Tachycardia 06/28/2020   History of COVID-19 06/28/2020   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 deficit 06/28/2020   Recurrent major depressive disorder, in full remission (HCC) 06/28/2020   Atypical chest pain 06/22/2020   Dizziness 06/22/2020   SOB (shortness of breath) 06/22/2020   Palpitations 06/14/2020   Anxiety 05/04/2020   Internal and external hemorrhoids without complication 04/29/2020   ETD (Eustachian tube dysfunction), bilateral 10/21/2019   Acne vulgaris 03/11/2014   Mild intermittent asthma without complication 03/11/2014   Seasonal allergies 03/11/2014    PCP: Cathe Mons, MD  REFERRING PROVIDER: Rudene Christians, DO  REFERRING DIAG: [name], MD  THERAPY DIAG:  Pain in right hip  Difficulty in walking, not elsewhere classified  Muscle weakness (generalized)  ONSET DATE: DOS 10/12  SUBJECTIVE:   SUBJECTIVE STATEMENT: Not really having any hip pain, more where the incision is. Scop patches are helpful but get bad side effects.   PERTINENT HISTORY: EDS, chronic abdominal/pelvic pain  PAIN:  Are you having pain? Yes  VAS scale: 3/10 Pain location: Rt hip Pain orientation: Right  PAIN TYPE: aching Pain description: constant  Aggravating factors: moving from sit to stand Relieving factors: lay down with leg propped up  PRECAUTIONS: Rt hip labral repair  WEIGHT BEARING RESTRICTIONS Yes NWB until 10/26, followed by WBAT ; hip flexion limited to 90 deg passively, no active hip flexion activation  FALLS:  Has patient fallen in last 6 months? No  LIVING  ENVIRONMENT: Lives with:  has a roommate Lives in: House/apartment Stairs: Yes; 2 stories- bedroom upstairs Has following equipment at home: Crutches  OCCUPATION: restaurant server  PLOF: Independent  PATIENT GOALS get back to normal, decrease pain, able to exercise for EDS stability   OBJECTIVE:    COGNITION:  Overall cognitive status: Within functional limits for tasks assessed     SENSATION:  Some numbness around hip as expected post op  MUSCLE LENGTH: Hypermobile with increased flexibility grossly  POSTURE:  Arrived with kyphotic posture on crutches as they were too short.   LE AROM/PROM:  A/PROM Right 03/14/2021 Left 03/14/2021  Hip flexion 70   Hip extension    Hip abduction    Hip adduction    Hip internal rotation    Hip external rotation    Knee flexion    Knee extension    Ankle dorsiflexion    Ankle plantarflexion    Ankle inversion    Ankle eversion     (Blank rows = not tested)  LE MMT:  MMT Right 03/14/2021 Left 03/14/2021  Hip flexion    Hip extension    Hip abduction    Hip adduction    Hip internal rotation    Hip external rotation    Knee flexion    Knee extension    Ankle dorsiflexion    Ankle plantarflexion    Ankle inversion    Ankle eversion     (Blank rows = not tested)   GAIT: Modified independence with bil axillary crutches    TODAY'S TREATMENT: 10/24  Passive abduction slides  AAROM sidelying hip abduction  SL clam & rev clam  Prone HS curl  Prone hip IR/ER at 45 deg knee flexion  Rt bent knee fall out with TA contraction- guarded by PT  Attempted recumb bike but too painful  10/20  Manual PROM hip flexion, ER, IR, AAROM abduction in supine  Quad sets  Ankle pumps  Isometric hip extension in supine Ice 10 min end of session  Eval: Manual: passive hip flexion, rotation within protocol limitations  Ankle pumps Quad set Ab set Adjusted crutches to proper height  PATIENT EDUCATION:  Education  details: Teacher, music of condition, POC, HEP, exercise form/rationale Person educated: Patient Education method: Programmer, multimedia, Demonstration, Tactile cues, Verbal cues, and Handouts Education comprehension: verbalized understanding, returned demonstration, verbal cues required, tactile cues required, and needs further education   HOME EXERCISE PROGRAM: [id number]  ASSESSMENT:  CLINICAL IMPRESSION: Progressed exercises per protocol to activate gluts. Attempted bike but was too uncomfortable-mostly at incision site. Will continue to progress per protocol in a pain free tolerance.   Pt able to achieve passive PROM ranges but reports moderate discomfort. Empty end feel at allowed ranges.    Rush protocol with exception that WB is toe-touch for 2 weeks and then prog as tolerated. [email or web address]  REHAB POTENTIAL: Good  CLINICAL DECISION MAKING: Evolving/moderate complexity  EVALUATION COMPLEXITY: Moderate   GOALS: Goals reviewed with patient? Yes  SHORT TERM GOALS:  STG Name Target Date Goal status  1 Begin walking household distances without AD Baseline:  03/25/21 INITIAL  2 Hip PROM 80% of Lt  Baseline: limited to 90 deg at eval 04/01/21 INITIAL  3 30 second 5TSTS without incr in pain to progress to next phase of protocol Baseline: unable at eval  04/11/2021 INITIAL                       LONG TERM GOALS:   LTG Name Target Date Goal status  1 To be set at short term evaluation depending on time along protocol Baseline:    2  Baseline:    3  Baseline:    4  Baseline:    5  Baseline:    6  Baseline:    7  Baseline:     PLAN: PT FREQUENCY: 2x/week  PT DURATION: other: 16 weeks  PLANNED INTERVENTIONS: Therapeutic exercises, Therapeutic activity, Neuro Muscular re-education, Balance training, Gait training, Patient/Family education, Joint mobilization, Stair training, Aquatic Therapy, Cryotherapy, Moist heat, Taping, and  Manual therapy  PLAN FOR NEXT SESSION: continue per protocol- block foot in hooklying to avoid hip flexor activation  Dai Mcadams C. Salmaan Patchin PT, DPT 03/14/21 5:39 PM

## 2021-03-14 ENCOUNTER — Encounter (HOSPITAL_BASED_OUTPATIENT_CLINIC_OR_DEPARTMENT_OTHER): Payer: BC Managed Care – PPO | Attending: Orthopaedic Surgery | Admitting: Physical Therapy

## 2021-03-14 ENCOUNTER — Other Ambulatory Visit: Payer: Self-pay

## 2021-03-14 ENCOUNTER — Encounter (HOSPITAL_BASED_OUTPATIENT_CLINIC_OR_DEPARTMENT_OTHER): Payer: Self-pay | Admitting: Physical Therapy

## 2021-03-14 ENCOUNTER — Ambulatory Visit: Payer: BC Managed Care – PPO | Admitting: Behavioral Health

## 2021-03-14 DIAGNOSIS — M6281 Muscle weakness (generalized): Secondary | ICD-10-CM | POA: Insufficient documentation

## 2021-03-14 DIAGNOSIS — M25551 Pain in right hip: Secondary | ICD-10-CM | POA: Diagnosis not present

## 2021-03-14 DIAGNOSIS — R262 Difficulty in walking, not elsewhere classified: Secondary | ICD-10-CM | POA: Insufficient documentation

## 2021-03-15 ENCOUNTER — Encounter (HOSPITAL_BASED_OUTPATIENT_CLINIC_OR_DEPARTMENT_OTHER): Payer: Self-pay | Admitting: Physical Therapy

## 2021-03-15 DIAGNOSIS — F32A Depression, unspecified: Secondary | ICD-10-CM | POA: Diagnosis not present

## 2021-03-15 DIAGNOSIS — F411 Generalized anxiety disorder: Secondary | ICD-10-CM | POA: Diagnosis not present

## 2021-03-17 ENCOUNTER — Ambulatory Visit: Payer: BC Managed Care – PPO | Admitting: Physical Therapy

## 2021-03-17 ENCOUNTER — Encounter (HOSPITAL_BASED_OUTPATIENT_CLINIC_OR_DEPARTMENT_OTHER): Payer: BC Managed Care – PPO | Admitting: Orthopaedic Surgery

## 2021-03-17 ENCOUNTER — Other Ambulatory Visit: Payer: Self-pay

## 2021-03-17 ENCOUNTER — Ambulatory Visit (HOSPITAL_BASED_OUTPATIENT_CLINIC_OR_DEPARTMENT_OTHER): Payer: BC Managed Care – PPO | Admitting: Physical Therapy

## 2021-03-17 DIAGNOSIS — M6281 Muscle weakness (generalized): Secondary | ICD-10-CM

## 2021-03-17 DIAGNOSIS — M25551 Pain in right hip: Secondary | ICD-10-CM | POA: Diagnosis not present

## 2021-03-17 DIAGNOSIS — R262 Difficulty in walking, not elsewhere classified: Secondary | ICD-10-CM | POA: Diagnosis not present

## 2021-03-17 DIAGNOSIS — S73191A Other sprain of right hip, initial encounter: Secondary | ICD-10-CM | POA: Diagnosis not present

## 2021-03-17 NOTE — Therapy (Signed)
OUTPATIENT PHYSICAL THERAPY LOWER EXTREMITY Re-Evaluation for post-op treatments   Patient Name: Brianna [name] MRN: [id number] DOB:1996-02-04, 25 y.o., female Today's Date: 03/17/2021   PT End of Session - 03/17/21 1445     Visit Number 6    Number of Visits 35    Date for PT Re-Evaluation 06/24/21    Authorization Type BCBS    PT Start Time 1435    PT Stop Time 1515    PT Time Calculation (min) 40 min    Activity Tolerance Patient tolerated treatment well    Behavior During Therapy [facility] for tasks assessed/performed               Past Medical History:  Diagnosis Date   Acute recurrent pansinusitis 10/21/2019   ADHD (attention deficit hyperactivity disorder)    inattentive   Anemia    Anxiety    Asthma    Bronchitis    Cat allergy due to both airborne and skin contact 05/01/2016   Chronic sore throat 05/01/2016   COVID 2020   Depression    Fibromyalgia    Gastroparesis    GERD (gastroesophageal reflux disease)    Hypermobile Ehlers-Danlos syndrome    Irregular heart rate    Keratosis pilaris    Migraine    Seasonal allergic rhinitis 03/11/2014   Past Surgical History:  Procedure Laterality Date   ESOPHAGOGASTRODUODENOSCOPY ENDOSCOPY     03/2020   JOINT REPLACEMENT     LABRAL REPAIR Right 03/02/2021   Procedure: RIGHT HIP ARTHROSCOPY WITH LABRAL REPAIR AND PINCE DEBRIDEMENT;  Surgeon: [name], MD;  Location: [facility] OR;  Service: Orthopedics;  Laterality: Right;   SMART PILL PROCEDURE     12/2020   TYMPANOSTOMY TUBE PLACEMENT Bilateral    WISDOM TOOTH EXTRACTION     Patient Active Problem List   Diagnosis Date Noted   Headache 11/30/2020   Positive ANA (antinuclear antibody) 11/15/2020   Fibromyalgia 11/02/2020   Hair loss 11/02/2020   Muscle spasticity 10/22/2020   Rash and other nonspecific skin eruption 10/22/2020   Paresthesia of bilateral legs 10/06/2020   Myalgia 10/06/2020   Chronic pelvic pain in female 10/06/2020   Chronic  abdominal pain 10/06/2020   IBS (irritable bowel syndrome) 08/02/2020   Pelvic floor dysfunction 08/02/2020   Irritant dermatitis 08/02/2020   Gastroesophageal reflux disease 06/28/2020   Severe recurrent major depression without psychotic features (HCC) 06/28/2020   Polyarthralgia 06/28/2020   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 06/28/2020   Tachycardia 06/28/2020   History of COVID-19 06/28/2020   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 deficit 06/28/2020   Recurrent major depressive disorder, in full remission (HCC) 06/28/2020   Atypical chest pain 06/22/2020   Dizziness 06/22/2020   SOB (shortness of breath) 06/22/2020   Palpitations 06/14/2020   Anxiety 05/04/2020   Internal and external hemorrhoids without complication 04/29/2020   ETD (Eustachian tube dysfunction), bilateral 10/21/2019   Acne vulgaris 03/11/2014   Mild intermittent asthma without complication 03/11/2014   Seasonal allergies 03/11/2014    PCP: Cathe Mons, MD  REFERRING PROVIDER: Rudene Christians, DO  REFERRING DIAG: [name], MD  THERAPY DIAG:  Pain in right hip  Difficulty in walking, not elsewhere classified  Muscle weakness (generalized)  ONSET DATE: DOS 10/12  SUBJECTIVE:   SUBJECTIVE STATEMENT: Pt states she has had more hip and lateral hip pain. Especially near the bursa. Pt describes it as sharp with touch. Pt states she notices more blood pool into the R LE. She notices  more NT into the foot.  Pt states she has to go back to using stairs tomorrow at home.  PERTINENT HISTORY: EDS, chronic abdominal/pelvic pain  PAIN:  Are you having pain? Yes VAS scale: 7/10 Pain location: lateral hip, entire hip Pain orientation: Right  PAIN TYPE: aching Pain description: constant  Aggravating factors: moving from sit to stand Relieving factors: lay down with leg propped up  PRECAUTIONS: Rt hip labral repair  WEIGHT BEARING RESTRICTIONS Yes NWB  until 10/26, followed by WBAT ; hip flexion limited to 90 deg passively, no active hip flexion activation  FALLS:  Has patient fallen in last 6 months? No  LIVING ENVIRONMENT: Lives with:  has a roommate Lives in: House/apartment Stairs: Yes; 2 stories- bedroom upstairs Has following equipment at home: Crutches  OCCUPATION: restaurant server  PLOF: Independent  PATIENT GOALS get back to normal, decrease pain, able to exercise for EDS stability   OBJECTIVE: Incisions: clean, dry with steri -strips in place. Mild scabbing noted at edges of strips but no discolored/cloudy discharge present    GAIT: Modified independence with bil axillary crutches. Progressed to WBAT with bilat axillary crutches; Cuing required for TKE/quad set during single limb support.    TODAY'S TREATMENT:  10/27   STM: R QL, iliopsoas, glut med  PPT 2s 2x10 Gait training for WBAT with bilat axillary crutches, emphasis on step through patten; stair training with bilat axillary crutches- 1 flight ascend and descend. Demo given for using railing and both crutches in one hand Bridges 2x10 30s 2x Gastroc stretch   10/24  Passive abduction slides  AAROM sidelying hip abduction  SL clam & rev clam  Prone HS curl  Prone hip IR/ER at 45 deg knee flexion  Rt bent knee fall out with TA contraction- guarded by PT  Attempted recumb bike but too painful  10/20  Manual PROM hip flexion, ER, IR, AAROM abduction in supine  Quad sets  Ankle pumps  Isometric hip extension in supine Ice 10 min end of session  Eval: Manual: passive hip flexion, rotation within protocol limitations  Ankle pumps Quad set Ab set Adjusted crutches to proper height  PATIENT EDUCATION:  Education details:  anatomy, exercise progression, DOMS expectations, muscle firing,  protocol/precautions, gait mechanics, HEP Person educated: Patient Education method: Programmer, multimedia, Demonstration, Tactile cues, Verbal cues, and  Handouts Education comprehension: verbalized understanding, returned demonstration, verbal cues required, tactile cues required, and needs further education   HOME EXERCISE PROGRAM: [email or web address]  ASSESSMENT:  CLINICAL IMPRESSION: Pt presented to today's session with increased R hip pain due to knee fall out exercise. Pt with good tolerance to manual therapy but complains of increased NT into R foot with supine positioning. Pt shows signs of fear avoidance and was given reinforcement and encouragement for WBAT during gait to promote increased step length prior to stair training. With VC and TC, pt demonstrated good technique with level ground gait and stair management with step through pattern and bilat axillary crutches. Pt complaint of NT still present at foot at end of session but decreased. Bilat crutches kept for pt comfort, likely able to wean to single crutch at next session. Pt to see ortho MD Nov 3. Seeing cardiologist for testing of POTS 10/28. Pt would benefit from continued skilled therapy in order to reach goals and maximize functional R LE strength and ROM for full return to PLOF.    Rush protocol with exception that WB is toe-touch for 2 weeks and then prog as tolerated. [email or web address]  REHAB POTENTIAL: Good  CLINICAL DECISION MAKING: Evolving/moderate complexity  EVALUATION COMPLEXITY: Moderate   GOALS: Goals reviewed with patient? Yes  SHORT TERM GOALS:  STG Name Target Date Goal status  1 Begin walking household distances without AD Baseline:  03/25/21 INITIAL  2 Hip PROM 80% of Lt  Baseline: limited to 90 deg at eval 04/01/21 INITIAL  3 30 second 5TSTS without incr in pain to progress to next phase of protocol Baseline: unable at eval  04/14/2021 INITIAL                       LONG TERM GOALS:   LTG Name Target Date Goal status  1 To be set at short term evaluation depending on time along protocol Baseline:    2   Baseline:    3  Baseline:    4  Baseline:    5  Baseline:    6  Baseline:    7  Baseline:     PLAN: PT FREQUENCY: 2x/week  PT DURATION: other: 16 weeks  PLANNED INTERVENTIONS: Therapeutic exercises, Therapeutic activity, Neuro Muscular re-education, Balance training, Gait training, Patient/Family education, Joint mobilization, Stair training, Aquatic Therapy, Cryotherapy, Moist heat, Taping, and Manual therapy  PLAN FOR NEXT SESSION: continue per protocol- block foot in hooklying to avoid hip flexor activation  Zebedee Iba PT, DPT 03/17/21 3:37 PM

## 2021-03-21 ENCOUNTER — Encounter: Payer: Self-pay | Admitting: Physical Therapy

## 2021-03-21 ENCOUNTER — Ambulatory Visit
Payer: BC Managed Care – PPO | Attending: Student in an Organized Health Care Education/Training Program | Admitting: Physical Therapy

## 2021-03-21 ENCOUNTER — Other Ambulatory Visit: Payer: Self-pay

## 2021-03-21 ENCOUNTER — Ambulatory Visit (INDEPENDENT_AMBULATORY_CARE_PROVIDER_SITE_OTHER): Payer: BC Managed Care – PPO | Admitting: Orthopaedic Surgery

## 2021-03-21 DIAGNOSIS — R262 Difficulty in walking, not elsewhere classified: Secondary | ICD-10-CM | POA: Diagnosis not present

## 2021-03-21 DIAGNOSIS — M25551 Pain in right hip: Secondary | ICD-10-CM | POA: Insufficient documentation

## 2021-03-21 DIAGNOSIS — M6281 Muscle weakness (generalized): Secondary | ICD-10-CM | POA: Diagnosis not present

## 2021-03-21 DIAGNOSIS — M357 Hypermobility syndrome: Secondary | ICD-10-CM | POA: Diagnosis not present

## 2021-03-21 DIAGNOSIS — M24159 Other articular cartilage disorders, unspecified hip: Secondary | ICD-10-CM | POA: Diagnosis not present

## 2021-03-21 DIAGNOSIS — S73191D Other sprain of right hip, subsequent encounter: Secondary | ICD-10-CM

## 2021-03-21 NOTE — Patient Instructions (Signed)
Access Code: [email or web address] URL: [email or web address] Date: 03/21/2021 Prepared by: Karie Mainland  Exercises Supine Transversus Abdominis Bracing - Hands on Stomach - 5 x daily - 7 x weekly - 5 sets - 3 breaths hold Supine Quadricep Sets - 5 x daily - 7 x weekly - 20 sets Supine Ankle Pumps - 5 x daily - 7 x weekly - 20 sets Supine Hip and Knee Flexion PROM with Caregiver - 5 x daily - 7 x weekly - 1 min hold Clamshell - 1 x daily - 7 x weekly - 3 sets - 10 reps Sidelying Reverse Clamshell - 1 x daily - 7 x weekly - 3 sets - 10 reps Supine Bridge - 1 x daily - 7 x weekly - 3 sets - 10 reps

## 2021-03-21 NOTE — Therapy (Signed)
[facility] [address] [place], [place], [address] Phone: [phone]   Fax:  [phone]  Physical Therapy Treatment  Patient Details  Name: Brianna [name] MRN: [id number] Date of Birth: 09-20-1995 Referring Provider (PT): Dr. [name]   Encounter Date: 03/21/2021   PT End of Session - 03/21/21 1352     Visit Number 7    Number of Visits 23    Date for PT Re-Evaluation 05/16/21    Authorization Type BCBS    PT Start Time 1331    PT Stop Time 1422    PT Time Calculation (min) 51 min    Activity Tolerance Patient tolerated treatment well    Behavior During Therapy [facility] for tasks assessed/performed             Past Medical History:  Diagnosis Date   Acute recurrent pansinusitis 10/21/2019   ADHD (attention deficit hyperactivity disorder)    inattentive   Anemia    Anxiety    Asthma    Bronchitis    Cat allergy due to both airborne and skin contact 05/01/2016   Chronic sore throat 05/01/2016   COVID 2020   Depression    Fibromyalgia    Gastroparesis    GERD (gastroesophageal reflux disease)    Hypermobile Ehlers-Danlos syndrome    Irregular heart rate    Keratosis pilaris    Migraine    Seasonal allergic rhinitis 03/11/2014    Past Surgical History:  Procedure Laterality Date   ESOPHAGOGASTRODUODENOSCOPY ENDOSCOPY     03/2020   JOINT REPLACEMENT     LABRAL REPAIR Right 03/02/2021   Procedure: RIGHT HIP ARTHROSCOPY WITH LABRAL REPAIR AND PINCE DEBRIDEMENT;  Surgeon: [name], MD;  Location: [facility] OR;  Service: Orthopedics;  Laterality: Right;   SMART PILL PROCEDURE     12/2020   TYMPANOSTOMY TUBE PLACEMENT Bilateral    WISDOM TOOTH EXTRACTION      There were no vitals filed for this visit.   Subjective Assessment - 03/21/21 1336     Subjective Took a tumble the other day and it was really painful yesterday.  Pain today is 7/10.  She was referred to this cilnic due to my knowledge of  EDS . Since I was a kid my pelvis Rt side would pop. EDS diagnosed Dr. Margaretha Sheffield this past Aug. 22.    Pertinent History gastroparesis I dropped 50#;  now holding 105# but considered 25# underweight (working with nutritionist)    Currently in Pain? Yes    Pain Score 7     Pain Location Hip    Pain Orientation Right    Pain Descriptors / Indicators Aching    Pain Type Surgical pain    Pain Onset 1 to 4 weeks ago    Pain Frequency Constant    Aggravating Factors  increased weight bearing    Pain Relieving Factors rest, sitting              Skilled therapy interventions:   Therapeutic Exercise: HEP verbal and review   Supine quad sets x 10    Glute set x 10    Isometric adduction x 10     Add knee ext x 10 with ball squeeze    Hamstring bridge x 15    Tr A x 5   Unilateral clam x 10   Sidelying clam x 10 and reverse clam  x 5   Quadruped  Rocking x 10 in neutral spine   Cat/camel x 5  UE lifts with ball between knees x 10   Gait in parallel bars, lowered crutches to 5'6 (patient is 5'4") Forward, lateral walking , pain with weight on Rt LE to step L  Heel raises x 20   Gradually decreasing UE assist with forward walking x 10 total, about 10 feet      PT Short Term Goals - 03/21/21 1607       PT SHORT TERM GOAL #1   Title Pt will be I with initial HEP for Rt hip ROM, strength in Rt LE    Period Weeks    Status New    Target Date 04/18/21      PT SHORT TERM GOAL #2   Title Pt will be able to complete FOTO and understand results    Time 4    Period Weeks    Status New    Target Date 04/18/21      PT SHORT TERM GOAL #3   Title Pt will be able to walk without crutches in her home and min increase in pain    Time 4    Period Weeks    Status New    Target Date 04/18/21      PT SHORT TERM GOAL #4   Title Pt will be able to return to her apartment if tolerating gait and stairs with or without crutches.    Time 4    Period Weeks    Status New    Target  Date 04/18/21               PT Long Term Goals - 03/21/21 1609       PT LONG TERM GOAL #1   Title Patient is independent with advanced HEP    Time 8    Period Weeks    Status New    Target Date 05/16/21      PT LONG TERM GOAL #2   Title Pt will be able to score FOTO TBA    Time 8    Period Weeks    Status New    Target Date 05/16/21      PT LONG TERM GOAL #3   Title Pt will be able to lead with her Rt LE when walking up stairs in her home 50% of the time with pain minimal    Time 8    Period Weeks    Status New    Target Date 05/16/21      PT LONG TERM GOAL #4   Title Pt will be able to walk with full stride and no increase in pain for 1000 feet with no device    Time 8    Period Weeks    Status New    Target Date 05/16/21      PT LONG TERM GOAL #5   Title Pt will be able to demo strength in Rt hip to 4/5 in all planes    Time 8    Period Weeks    Status New    Target Date 05/16/21                   Plan - 03/21/21 1605     Clinical Impression Statement Patient seen today for PT treatment at [facility]. post Rt labral repair in R hip. Given her  comorbid EDS-III hypermobility she was transferred to this location. She is nearly 3 weeks post, was given the allowance for WBAT today with crutches.  Reviewed current HEP and  set 8 week goals .  She was able to walk in parallel bars with min UE assist but was concerned about post PT soreness and she had baseline pain from a minor fall this past weekend. PROM as follows and kept within protocol: Flexion 85 deg, IR min pain 15 deg, ER 25-30 deg . Good heel strike and gait pattern.    Personal Factors and Comorbidities Time since onset of injury/illness/exacerbation;Finances;Sex;Profession;Past/Current Experience    Examination-Activity Limitations Locomotion Level;Toileting;Stand;Lift;Dressing;Squat;Stairs    Examination-Participation Restrictions Occupation;Community Activity;Interpersonal Relationship;Shop     Stability/Clinical Decision Making Stable/Uncomplicated    Clinical Decision Making Low    Rehab Potential Excellent    PT Frequency 2x / week    PT Duration 8 weeks    PT Treatment/Interventions ADLs/Self Care Home Management;Biofeedback;Cryotherapy;Electrical Stimulation;Iontophoresis 4mg /ml Dexamethasone;Moist Heat;Ultrasound;Neuromuscular re-education;Therapeutic exercise;Therapeutic activities;Patient/family education;Manual techniques;Dry needling;Taping;Spinal Manipulations;Joint Manipulations;Aquatic Therapy;Passive range of motion    PT Next Visit Plan gait, pilates reformer?    PT Home Exercise Plan Access Code: [email or web address]    Consulted and Agree with Plan of Care Patient             Patient will benefit from skilled therapeutic intervention in order to improve the following deficits and impairments:  Decreased coordination, Increased fascial restricitons, Decreased endurance, Decreased activity tolerance, Dizziness, Pain, Decreased strength, Decreased mobility, Decreased range of motion, Hypermobility  Visit Diagnosis: Pain in right hip  Difficulty in walking, not elsewhere classified  Muscle weakness (generalized)     Problem List Patient Active Problem List   Diagnosis Date Noted   Headache 11/30/2020   Positive ANA (antinuclear antibody) 11/15/2020   Fibromyalgia 11/02/2020   Hair loss 11/02/2020   Muscle spasticity 10/22/2020   Rash and other nonspecific skin eruption 10/22/2020   Paresthesia of bilateral legs 10/06/2020   Myalgia 10/06/2020   Chronic pelvic pain in female 10/06/2020   Chronic abdominal pain 10/06/2020   IBS (irritable bowel syndrome) 08/02/2020   Pelvic floor dysfunction 08/02/2020   Irritant dermatitis 08/02/2020   Gastroesophageal reflux disease 06/28/2020   Severe recurrent major depression without psychotic features (HCC) 06/28/2020   Polyarthralgia 06/28/2020   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 06/28/2020    Tachycardia 06/28/2020   History of COVID-19 06/28/2020   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 deficit 06/28/2020   Recurrent major depressive disorder, in full remission (HCC) 06/28/2020   Atypical chest pain 06/22/2020   Dizziness 06/22/2020   SOB (shortness of breath) 06/22/2020   Palpitations 06/14/2020   Anxiety 05/04/2020   Internal and external hemorrhoids without complication 04/29/2020   ETD (Eustachian tube dysfunction), bilateral 10/21/2019   Acne vulgaris 03/11/2014   Mild intermittent asthma without complication 03/11/2014   Seasonal allergies 03/11/2014    Muhamad Serano, PT 03/21/2021, 8:06 PM  [facility] [address] [place], [place], [place] Phone: [phone]   Fax:  [phone]  Name: Brianna [name] MRN: [name] Date of Birth: 10/11/1995   12/19/1995, PT 03/21/21 8:06 PM Phone: [phone] Fax: [phone]

## 2021-03-21 NOTE — Progress Notes (Signed)
Post Operative Evaluation    Procedure/Date of Surgery: Right hip arthroscopy with labral repair, pincer debridement 03/02/21  Interval History:   Brianna [name] presents today 2 and half weeks out from the above procedure.  Her portal incisions have healed at this time.  She did have 1 incident where the foot got caught under her on the preceding Saturday and she had a minor fall.  Overall the brace did protect her through this incident.  She has been doing some partial weightbearing at this time.  She has been compliant with taking aspirin.  She has been taking tramadol for pain.  She does endorse some numbness in the great toe  PMH/PSH/Family History/Social History/Meds/Allergies:    Past Medical History:  Diagnosis Date   Acute recurrent pansinusitis 10/21/2019   ADHD (attention deficit hyperactivity disorder)    inattentive   Anemia    Anxiety    Asthma    Bronchitis    Cat allergy due to both airborne and skin contact 05/01/2016   Chronic sore throat 05/01/2016   COVID 2020   Depression    Fibromyalgia    Gastroparesis    GERD (gastroesophageal reflux disease)    Hypermobile Ehlers-Danlos syndrome    Irregular heart rate    Keratosis pilaris    Migraine    Seasonal allergic rhinitis 03/11/2014   Past Surgical History:  Procedure Laterality Date   ESOPHAGOGASTRODUODENOSCOPY ENDOSCOPY     03/2020   JOINT REPLACEMENT     LABRAL REPAIR Right 03/02/2021   Procedure: RIGHT HIP ARTHROSCOPY WITH LABRAL REPAIR AND PINCE DEBRIDEMENT;  Surgeon: [name], MD;  Location: [facility] OR;  Service: Orthopedics;  Laterality: Right;   SMART PILL PROCEDURE     12/2020   TYMPANOSTOMY TUBE PLACEMENT Bilateral    WISDOM TOOTH EXTRACTION     Social History   Socioeconomic History   Marital status: Single    Spouse name: Not on file   Number of children: Not on file   Years of education: Not on file   Highest education level: Not on file  Occupational  History   Not on file  Tobacco Use   Smoking status: Former    Types: E-cigarettes, Cigarettes    Quit date: 2021    Years since quitting: 1.8   Smokeless tobacco: Never  Vaping Use   Vaping Use: Former   Substances: Financial trader  Substance and Sexual Activity   Alcohol use: Not Currently   Drug use: Yes    Types: Marijuana    Comment: last use 7 days ago   Sexual activity: Yes    Birth control/protection: None  Other Topics Concern   Not on file  Social History Narrative   Right handed   Social Determinants of Health   Financial Resource Strain: Not on file  Food Insecurity: Not on file  Transportation Needs: Not on file  Physical Activity: Not on file  Stress: Not on file  Social Connections: Not on file   Family History  Problem Relation Age of Onset   Diabetes Mother    Polycystic ovary syndrome Mother    GER disease Father    Heart disease Paternal Uncle    Allergies  Allergen Reactions   Clindamycin Hives, Swelling and Rash   Current Outpatient Medications  Medication Sig Dispense Refill  traMADol (ULTRAM) 50 MG tablet Take 1 tablet (50 mg total) by mouth every 12 (twelve) hours as needed for severe pain. 30 tablet 0   Acetylcysteine (NAC) 600 MG CAPS Take 1 capsule (600 mg total) by mouth 2 (two) times daily. (Patient not taking: Reported on 03/08/2021) 60 capsule 0   albuterol (VENTOLIN HFA) 108 (90 Base) MCG/ACT inhaler Inhale 1 puff into the lungs every 6 (six) hours as needed for shortness of breath or wheezing. (Patient not taking: Reported on 03/08/2021)     aspirin EC 325 MG tablet Take 1 tablet (325 mg total) by mouth daily. 30 tablet 0   Cholecalciferol (VITAMIN D3 PO) Take 1 capsule by mouth every 3 (three) days.     dicyclomine (BENTYL) 10 MG capsule Take 10 mg by mouth 3 (three) times daily.     Ferrous Sulfate Dried (EQ SLOW-RELEASE IRON) 45 MG TBCR Take 1 tablet by mouth every 3 (three) days.     fluticasone (FLONASE) 50 MCG/ACT  nasal spray SPRAY 1 SPRAY INTO BOTH NOSTRILS DAILY. (Patient taking differently: Place 1 spray into both nostrils 2 (two) times daily.) 16 mL 1   loratadine (CLARITIN) 10 MG tablet Take 1 tablet (10 mg total) by mouth daily. 90 tablet 1   Multiple Vitamins-Minerals (ADULT GUMMY PO) Take 2 capsules by mouth daily.     ondansetron (ZOFRAN ODT) 4 MG disintegrating tablet Take 1 tablet (4 mg total) by mouth every 8 (eight) hours as needed for nausea or vomiting. 20 tablet 5   OVER THE COUNTER MEDICATION Apply 1 application topically daily as needed (pain). CBD menthol muscle rub     pregabalin (LYRICA) 25 MG capsule Take 25 mg by mouth daily.     promethazine (PHENERGAN) 25 MG tablet Take 1 tablet (25 mg total) by mouth every 6 (six) hours as needed for nausea or vomiting. 30 tablet 0   Prucalopride Succinate 2 MG TABS Take 2 mg by mouth daily.     RABEprazole (ACIPHEX) 20 MG tablet Take 20 mg by mouth 2 (two) times daily.     scopolamine (TRANSDERM-SCOP, 1.5 MG,) 1 MG/3DAYS Place 1 patch (1.5 mg total) onto the skin every 3 (three) days. 4 patch 0   No current facility-administered medications for this visit.   No results found.  Review of Systems:   A ROS was performed including pertinent positives and negatives as documented in the HPI.   Musculoskeletal Exam:     Right hip incisions are well healed.  No pain with internal rotation external rotation of 20 degrees.  She is able to place up to 50% weightbearing on the right.  Sensation is intact in all distributions.  Imaging:    None  I personally reviewed and interpreted the radiographs.   Assessment:   25 year old female status post right hip arthroscopy.  Overall appears to be doing much better at today's visit.  She states that the scopolamine patches that we ordered at last visit significantly helped and she is able to tolerate food better.  At this point I would like her to advance per protocol.  She may begin to progress her  weightbearing to weightbearing as tolerated.  Plan :    -Return in 4 weeks for repeat check -Advance per protocol -External referral provided    I personally saw and evaluated the patient, and participated in the management and treatment plan.  [name], MD Attending Physician, Orthopedic Surgery  This document was dictated using Dragon voice recognition software.  A reasonable attempt at proof reading has been made to minimize errors.

## 2021-03-23 ENCOUNTER — Encounter: Payer: BC Managed Care – PPO | Admitting: Physical Therapy

## 2021-03-23 DIAGNOSIS — R202 Paresthesia of skin: Secondary | ICD-10-CM | POA: Diagnosis not present

## 2021-03-24 ENCOUNTER — Other Ambulatory Visit: Payer: Self-pay

## 2021-03-24 ENCOUNTER — Ambulatory Visit: Payer: BC Managed Care – PPO | Attending: Student in an Organized Health Care Education/Training Program

## 2021-03-24 DIAGNOSIS — M357 Hypermobility syndrome: Secondary | ICD-10-CM | POA: Diagnosis not present

## 2021-03-24 DIAGNOSIS — M25551 Pain in right hip: Secondary | ICD-10-CM | POA: Diagnosis not present

## 2021-03-24 DIAGNOSIS — M6281 Muscle weakness (generalized): Secondary | ICD-10-CM | POA: Diagnosis not present

## 2021-03-24 DIAGNOSIS — R262 Difficulty in walking, not elsewhere classified: Secondary | ICD-10-CM

## 2021-03-24 DIAGNOSIS — M24159 Other articular cartilage disorders, unspecified hip: Secondary | ICD-10-CM | POA: Insufficient documentation

## 2021-03-24 DIAGNOSIS — F411 Generalized anxiety disorder: Secondary | ICD-10-CM | POA: Diagnosis not present

## 2021-03-24 DIAGNOSIS — F32A Depression, unspecified: Secondary | ICD-10-CM | POA: Diagnosis not present

## 2021-03-24 NOTE — Therapy (Signed)
[facility] [address] [place], [place], [address] Phone: [phone]   Fax:  [phone]  Physical Therapy Treatment  Patient Details  Name: Brianna [name] MRN: [id number] Date of Birth: Oct 13, 1995 Referring Provider (PT): Dr. [name]   Encounter Date: 03/24/2021   PT End of Session - 03/24/21 1356     Visit Number 8    Number of Visits 23    Date for PT Re-Evaluation 05/16/21    Authorization Type BCBS    PT Start Time 1357    PT Stop Time 1435    PT Time Calculation (min) 38 min    Activity Tolerance Patient tolerated treatment well    Behavior During Therapy [facility] for tasks assessed/performed             Past Medical History:  Diagnosis Date   Acute recurrent pansinusitis 10/21/2019   ADHD (attention deficit hyperactivity disorder)    inattentive   Anemia    Anxiety    Asthma    Bronchitis    Cat allergy due to both airborne and skin contact 05/01/2016   Chronic sore throat 05/01/2016   COVID 2020   Depression    Fibromyalgia    Gastroparesis    GERD (gastroesophageal reflux disease)    Hypermobile Ehlers-Danlos syndrome    Irregular heart rate    Keratosis pilaris    Migraine    Seasonal allergic rhinitis 03/11/2014    Past Surgical History:  Procedure Laterality Date   ESOPHAGOGASTRODUODENOSCOPY ENDOSCOPY     03/2020   JOINT REPLACEMENT     LABRAL REPAIR Right 03/02/2021   Procedure: RIGHT HIP ARTHROSCOPY WITH LABRAL REPAIR AND PINCE DEBRIDEMENT;  Surgeon: [name], MD;  Location: [facility] OR;  Service: Orthopedics;  Laterality: Right;   SMART PILL PROCEDURE     12/2020   TYMPANOSTOMY TUBE PLACEMENT Bilateral    WISDOM TOOTH EXTRACTION      There were no vitals filed for this visit.   Subjective Assessment - 03/24/21 1356     Subjective Pt presents to PT with no current reports of some soreness in R hip. Has been compliant with her HEP with no adverse effect. She is ready to  begin PT treatment at time.    Currently in Pain? Yes    Pain Score 6            [facility] Adult PT Treatment/Exercise:   Therapeutic Exercise:  Supine PPT x 10 - 5 sec hold Bridge 2x10 - 3 sec hold Supine hooklying ball squeeze 2x10 - 5 sec hold S/L clam R 2x10  Prone hamstring curl 2x15 ea Prone IR/ER R LE 2x10  Quadruped rocking x 10  Cat Cow x 10 Heel raise x 20 Lateral walk in // x 4 laps Amb with axillary crutch w/ L UE x 4 laps in //                               PT Short Term Goals - 03/21/21 1607       PT SHORT TERM GOAL #1   Title Pt will be I with initial HEP for Rt hip ROM, strength in Rt LE    Period Weeks    Status New    Target Date 04/18/21      PT SHORT TERM GOAL #2   Title Pt will be able to complete FOTO and understand results    Time 4  Period Weeks    Status New    Target Date 04/18/21      PT SHORT TERM GOAL #3   Title Pt will be able to walk without crutches in her home and min increase in pain    Time 4    Period Weeks    Status New    Target Date 04/18/21      PT SHORT TERM GOAL #4   Title Pt will be able to return to her apartment if tolerating gait and stairs with or without crutches.    Time 4    Period Weeks    Status New    Target Date 04/18/21               PT Long Term Goals - 03/21/21 1609       PT LONG TERM GOAL #1   Title Patient is independent with advanced HEP    Time 8    Period Weeks    Status New    Target Date 05/16/21      PT LONG TERM GOAL #2   Title Pt will be able to score FOTO TBA    Time 8    Period Weeks    Status New    Target Date 05/16/21      PT LONG TERM GOAL #3   Title Pt will be able to lead with her Rt LE when walking up stairs in her home 50% of the time with pain minimal    Time 8    Period Weeks    Status New    Target Date 05/16/21      PT LONG TERM GOAL #4   Title Pt will be able to walk with full stride and no increase in pain for 1000 feet with  no device    Time 8    Period Weeks    Status New    Target Date 05/16/21      PT LONG TERM GOAL #5   Title Pt will be able to demo strength in Rt hip to 4/5 in all planes    Time 8    Period Weeks    Status New    Target Date 05/16/21                   Plan - 03/24/21 1435     Clinical Impression Statement Pt was able to complete all prescribed exercises with no adverse effect or increase in pain. Today's session focused on improving proximal hip strength per protocol. We also worked on gait and balance, progressing to one crutch during ambulation while working in parallel bars. She continues to benefit from skilled PT services working on improving strength and functional ability post surgery.    PT Treatment/Interventions ADLs/Self Care Home Management;Biofeedback;Cryotherapy;Electrical Stimulation;Iontophoresis 4mg /ml Dexamethasone;Moist Heat;Ultrasound;Neuromuscular re-education;Therapeutic exercise;Therapeutic activities;Patient/family education;Manual techniques;Dry needling;Taping;Spinal Manipulations;Joint Manipulations;Aquatic Therapy;Passive range of motion    PT Next Visit Plan gait, pilates reformer?    PT Home Exercise Plan Access Code: [email or web address]             Patient will benefit from skilled therapeutic intervention in order to improve the following deficits and impairments:  Decreased coordination, Increased fascial restricitons, Decreased endurance, Decreased activity tolerance, Dizziness, Pain, Decreased strength, Decreased mobility, Decreased range of motion, Hypermobility  Visit Diagnosis: Pain in right hip  Difficulty in walking, not elsewhere classified  Muscle weakness (generalized)     Problem List Patient Active Problem List   Diagnosis Date Noted  Headache 11/30/2020   Positive ANA (antinuclear antibody) 11/15/2020   Fibromyalgia 11/02/2020   Hair loss 11/02/2020   Muscle spasticity 10/22/2020   Rash and other nonspecific skin eruption  10/22/2020   Paresthesia of bilateral legs 10/06/2020   Myalgia 10/06/2020   Chronic pelvic pain in female 10/06/2020   Chronic abdominal pain 10/06/2020   IBS (irritable bowel syndrome) 08/02/2020   Pelvic floor dysfunction 08/02/2020   Irritant dermatitis 08/02/2020   Gastroesophageal reflux disease 06/28/2020   Severe recurrent major depression without psychotic features (HCC) 06/28/2020   Polyarthralgia 06/28/2020   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 06/28/2020   Tachycardia 06/28/2020   History of COVID-19 06/28/2020   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 deficit 06/28/2020   Recurrent major depressive disorder, in full remission (HCC) 06/28/2020   Atypical chest pain 06/22/2020   Dizziness 06/22/2020   SOB (shortness of breath) 06/22/2020   Palpitations 06/14/2020   Anxiety 05/04/2020   Internal and external hemorrhoids without complication 04/29/2020   ETD (Eustachian tube dysfunction), bilateral 10/21/2019   Acne vulgaris 03/11/2014   Mild intermittent asthma without complication 03/11/2014   Seasonal allergies 03/11/2014    Eloy End, PT 03/24/2021, 2:41 PM  [facility] [address] [place], [place], [address] Phone: [phone]   Fax:  [phone]  Name: Brianna [name] MRN: [id number] Date of Birth: 1995/09/13

## 2021-03-25 ENCOUNTER — Encounter (HOSPITAL_BASED_OUTPATIENT_CLINIC_OR_DEPARTMENT_OTHER): Payer: BC Managed Care – PPO | Admitting: Orthopaedic Surgery

## 2021-03-28 ENCOUNTER — Ambulatory Visit: Payer: BC Managed Care – PPO | Admitting: Physical Therapy

## 2021-03-28 ENCOUNTER — Encounter: Payer: Self-pay | Admitting: Physical Therapy

## 2021-03-28 ENCOUNTER — Ambulatory Visit (INDEPENDENT_AMBULATORY_CARE_PROVIDER_SITE_OTHER): Payer: BC Managed Care – PPO | Admitting: Family Medicine

## 2021-03-28 ENCOUNTER — Other Ambulatory Visit: Payer: Self-pay

## 2021-03-28 DIAGNOSIS — M6281 Muscle weakness (generalized): Secondary | ICD-10-CM | POA: Diagnosis not present

## 2021-03-28 DIAGNOSIS — M357 Hypermobility syndrome: Secondary | ICD-10-CM | POA: Diagnosis not present

## 2021-03-28 DIAGNOSIS — K3184 Gastroparesis: Secondary | ICD-10-CM | POA: Diagnosis not present

## 2021-03-28 DIAGNOSIS — Z713 Dietary counseling and surveillance: Secondary | ICD-10-CM | POA: Diagnosis not present

## 2021-03-28 DIAGNOSIS — R262 Difficulty in walking, not elsewhere classified: Secondary | ICD-10-CM | POA: Diagnosis not present

## 2021-03-28 DIAGNOSIS — M24159 Other articular cartilage disorders, unspecified hip: Secondary | ICD-10-CM

## 2021-03-28 DIAGNOSIS — M25551 Pain in right hip: Secondary | ICD-10-CM | POA: Diagnosis not present

## 2021-03-28 NOTE — Therapy (Signed)
[facility] [address] [place], [place], [address] Phone: [phone]   Fax:  [phone]  Physical Therapy Treatment  Patient Details  Name: Brianna [name] MRN: [id number] Date of Birth: 02-Nov-1995 Referring Provider (PT): Dr. [name]   Encounter Date: 03/28/2021   PT End of Session - 03/28/21 1432     Visit Number 9    Number of Visits 23    Date for PT Re-Evaluation 05/16/21    Authorization Type BCBS    PT Start Time 1418    PT Stop Time 1513    PT Time Calculation (min) 55 min    Activity Tolerance Patient tolerated treatment well    Behavior During Therapy [facility] for tasks assessed/performed             Past Medical History:  Diagnosis Date   Acute recurrent pansinusitis 10/21/2019   ADHD (attention deficit hyperactivity disorder)    inattentive   Anemia    Anxiety    Asthma    Bronchitis    Cat allergy due to both airborne and skin contact 05/01/2016   Chronic sore throat 05/01/2016   COVID 2020   Depression    Fibromyalgia    Gastroparesis    GERD (gastroesophageal reflux disease)    Hypermobile Ehlers-Danlos syndrome    Irregular heart rate    Keratosis pilaris    Migraine    Seasonal allergic rhinitis 03/11/2014    Past Surgical History:  Procedure Laterality Date   ESOPHAGOGASTRODUODENOSCOPY ENDOSCOPY     03/2020   JOINT REPLACEMENT     LABRAL REPAIR Right 03/02/2021   Procedure: RIGHT HIP ARTHROSCOPY WITH LABRAL REPAIR AND PINCE DEBRIDEMENT;  Surgeon: [name], MD;  Location: [facility] OR;  Service: Orthopedics;  Laterality: Right;   SMART PILL PROCEDURE     12/2020   TYMPANOSTOMY TUBE PLACEMENT Bilateral    WISDOM TOOTH EXTRACTION      There were no vitals filed for this visit.   Subjective Assessment - 03/28/21 1430     Subjective Pt with min soreness in Rt outer hip.  She has been walking with 1 crutch at home a bit but feels lopsided.            Pilates Reformer  used for LE/core strength, postural strength, lumbopelvic disassociation and core control.  Exercises included:  Footwork 2 red for most exercises  Double leg parallel heels   Turnout heels and toes  Heel raises in parallel and then in turnout Single leg 1 red 1 yellow on RT LE  Pt needed extra time to initially position due to discomfort in Rt hip flexion     Bridging [place] with ball for 10 reps    Sidelying clam yellow band 2 x 10   Reverse clam no resistance ball between knees x 2 x 10   Sidelying fire hydrant x 10 with PT guidance   Supine isometric hip extension x 10   PROM gentle all planes , discomfort anteriorly due to being held in hip flexion, difficulty relaxing   Rt ant hip cold pack x 10 supine    PT Short Term Goals - 03/21/21 1607       PT SHORT TERM GOAL #1   Title Pt will be I with initial HEP for Rt hip ROM, strength in Rt LE    Period Weeks    Status New    Target Date 04/18/21      PT SHORT TERM GOAL #2  Title Pt will be able to complete FOTO and understand results    Time 4    Period Weeks    Status New    Target Date 04/18/21      PT SHORT TERM GOAL #3   Title Pt will be able to walk without crutches in her home and min increase in pain    Time 4    Period Weeks    Status New    Target Date 04/18/21      PT SHORT TERM GOAL #4   Title Pt will be able to return to her apartment if tolerating gait and stairs with or without crutches.    Time 4    Period Weeks    Status New    Target Date 04/18/21               PT Long Term Goals - 03/21/21 1609       PT LONG TERM GOAL #1   Title Patient is independent with advanced HEP    Time 8    Period Weeks    Status New    Target Date 05/16/21      PT LONG TERM GOAL #2   Title Pt will be able to score FOTO TBA    Time 8    Period Weeks    Status New    Target Date 05/16/21      PT LONG TERM GOAL #3   Title Pt will be able to lead with her Rt LE when walking up stairs in  her home 50% of the time with pain minimal    Time 8    Period Weeks    Status New    Target Date 05/16/21      PT LONG TERM GOAL #4   Title Pt will be able to walk with full stride and no increase in pain for 1000 feet with no device    Time 8    Period Weeks    Status New    Target Date 05/16/21      PT LONG TERM GOAL #5   Title Pt will be able to demo strength in Rt hip to 4/5 in all planes    Time 8    Period Weeks    Status New    Target Date 05/16/21                   Plan - 03/28/21 1503     Clinical Impression Statement Patient able to use Reformer for supine strengthening and stabilization, equal weightbearing. Had some discomfort with prolonged hip flexion, modified footbar setting and spring tension to accomodate.  She is not sure if she can walk without the brace, will follow up with MD regarding this after 3 week mark (is 3 1/2 weeks today).    PT Treatment/Interventions ADLs/Self Care Home Management;Biofeedback;Cryotherapy;Electrical Stimulation;Iontophoresis 4mg /ml Dexamethasone;Moist Heat;Ultrasound;Neuromuscular re-education;Therapeutic exercise;Therapeutic activities;Patient/family education;Manual techniques;Dry needling;Taping;Spinal Manipulations;Joint Manipulations;Aquatic Therapy;Passive range of motion    PT Next Visit Plan gait, pilates reformer? 30 sec STS    PT Home Exercise Plan Access Code: [email or web address]    Consulted and Agree with Plan of Care Patient             Patient will benefit from skilled therapeutic intervention in order to improve the following deficits and impairments:  Decreased coordination, Increased fascial restricitons, Decreased endurance, Decreased activity tolerance, Dizziness, Pain, Decreased strength, Decreased mobility, Decreased range of motion, Hypermobility  Visit Diagnosis: Pain in right hip  Difficulty in walking, not elsewhere classified  Muscle weakness (generalized)  Hypermobility syndrome  Labral tear of  hip, degenerative     Problem List Patient Active Problem List   Diagnosis Date Noted   Headache 11/30/2020   Positive ANA (antinuclear antibody) 11/15/2020   Fibromyalgia 11/02/2020   Hair loss 11/02/2020   Muscle spasticity 10/22/2020   Rash and other nonspecific skin eruption 10/22/2020   Paresthesia of bilateral legs 10/06/2020   Myalgia 10/06/2020   Chronic pelvic pain in female 10/06/2020   Chronic abdominal pain 10/06/2020   IBS (irritable bowel syndrome) 08/02/2020   Pelvic floor dysfunction 08/02/2020   Irritant dermatitis 08/02/2020   Gastroesophageal reflux disease 06/28/2020   Severe recurrent major depression without psychotic features (HCC) 06/28/2020   Polyarthralgia 06/28/2020   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 06/28/2020   Tachycardia 06/28/2020   History of COVID-19 06/28/2020   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 deficit 06/28/2020   Recurrent major depressive disorder, in full remission (HCC) 06/28/2020   Atypical chest pain 06/22/2020   Dizziness 06/22/2020   SOB (shortness of breath) 06/22/2020   Palpitations 06/14/2020   Anxiety 05/04/2020   Internal and external hemorrhoids without complication 04/29/2020   ETD (Eustachian tube dysfunction), bilateral 10/21/2019   Acne vulgaris 03/11/2014   Mild intermittent asthma without complication 03/11/2014   Seasonal allergies 03/11/2014    Chrishelle Zito, PT 03/28/2021, 3:07 PM  [facility] [address] [place], [place], [address] Phone: [phone]   Fax:  [phone]  Name: Anisten Tomassi MRN: [id number] Date of Birth: 1995/08/28   Karie Mainland, PT 03/28/21 3:08 PM Phone: [phone] Fax: [phone]

## 2021-03-28 NOTE — Patient Instructions (Signed)
Goals: 1. Eat at least 6 times daily, with your first meal within the first hour of being up.  2. Include sources of high-quality fat (starting with <1 teaspoon) at least 3 X day to maximize energy intake: olive oil, canola oil, avocado, nut butters, fatty fish such as salmon, tuna, mackerel.   3. Add some ground flax seed to your smoothie daily.  (Store in refrigerator.)  Increase to 1 1/2 teaspoons, ultimately to a max dose of 1 tablespoon.  You should not increase faster than 1/2 teaspoon per week, and not progress to a full tablespoon before 4 weeks.   4. If you decide you want to experiment with tart cherry juice, try adding the concentrate to your electrolyte water, and drink between meals.   - Before your next appt with Demetrio Lapping, give some thought to what you most want to get out of meeting with her, and what you feel you need, and communicate this with her.  Talk with her about strategies you can learn to manage anxiety.  You may want to tell her about the strategy we've discussed for dealing with negative thoughts:    When you are struggling, ask yourself these "decoding" questions:    1. What am I feeling right now?    2. What do I want to feel?    3. What do I truly need right now?    4. What are some things I can do to help me meet my need(s)? Use the Feelings and Needs lists, and WRITE your answers.   You are looking for feelings, not thoughts.  I suggest you discuss your answers with your therapist.  Dealing with negative thoughts can be a first step toward managing the feelings those thoughts prompt.     Follow-up video visit on Tuesday, December 13 at 11 AM.
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
## 2021-03-30 ENCOUNTER — Encounter: Payer: BC Managed Care – PPO | Admitting: Physical Therapy

## 2021-03-31 ENCOUNTER — Encounter: Payer: Self-pay | Admitting: Physical Therapy

## 2021-03-31 ENCOUNTER — Ambulatory Visit: Payer: BC Managed Care – PPO | Admitting: Physical Therapy

## 2021-03-31 ENCOUNTER — Other Ambulatory Visit: Payer: Self-pay

## 2021-03-31 DIAGNOSIS — M6281 Muscle weakness (generalized): Secondary | ICD-10-CM | POA: Diagnosis not present

## 2021-03-31 DIAGNOSIS — M357 Hypermobility syndrome: Secondary | ICD-10-CM | POA: Diagnosis not present

## 2021-03-31 DIAGNOSIS — M25551 Pain in right hip: Secondary | ICD-10-CM | POA: Diagnosis not present

## 2021-03-31 DIAGNOSIS — M24159 Other articular cartilage disorders, unspecified hip: Secondary | ICD-10-CM | POA: Diagnosis not present

## 2021-03-31 DIAGNOSIS — R262 Difficulty in walking, not elsewhere classified: Secondary | ICD-10-CM

## 2021-03-31 NOTE — Therapy (Signed)
[facility] Outpatient Rehabilitation [facility] [address] [place], [place], [address] Phone: [phone]   Fax:  [phone]  Physical Therapy Treatment  Patient Details  Name: Brianna [name] MRN: [id number] Date of Birth: January 11, 1996 Referring Provider (PT): Dr. [name]   Encounter Date: 03/31/2021   PT End of Session - 03/31/21 0935     Visit Number 10    Number of Visits 23    Date for PT Re-Evaluation 05/16/21    Authorization Type BCBS    PT Start Time 0930    PT Stop Time 1030    PT Time Calculation (min) 60 min    Activity Tolerance Patient tolerated treatment well    Behavior During Therapy [facility] for tasks assessed/performed             Past Medical History:  Diagnosis Date   Acute recurrent pansinusitis 10/21/2019   ADHD (attention deficit hyperactivity disorder)    inattentive   Anemia    Anxiety    Asthma    Bronchitis    Cat allergy due to both airborne and skin contact 05/01/2016   Chronic sore throat 05/01/2016   COVID 2020   Depression    Fibromyalgia    Gastroparesis    GERD (gastroesophageal reflux disease)    Hypermobile Ehlers-Danlos syndrome    Irregular heart rate    Keratosis pilaris    Migraine    Seasonal allergic rhinitis 03/11/2014    Past Surgical History:  Procedure Laterality Date   ESOPHAGOGASTRODUODENOSCOPY ENDOSCOPY     03/2020   JOINT REPLACEMENT     LABRAL REPAIR Right 03/02/2021   Procedure: RIGHT HIP ARTHROSCOPY WITH LABRAL REPAIR AND PINCE DEBRIDEMENT;  Surgeon: [name], MD;  Location: [facility] OR;  Service: Orthopedics;  Laterality: Right;   SMART PILL PROCEDURE     12/2020   TYMPANOSTOMY TUBE PLACEMENT Bilateral    WISDOM TOOTH EXTRACTION      There were no vitals filed for this visit.   Subjective Assessment - 03/31/21 0931     Subjective Pt took the brace away after last visit. the outer hip is still very sore when I touch it.  I do want to know about lifting restrictions.                 [facility] Adult PT Treatment/Exercise:   Therapeutic Exercise: Sit to stand 5 x STS in 11 sec  30 sec STS x 13 reps Standing hip abduction x 15, extension x 15 and then hip flexion to 90 deg x 10  LAQ Rt 4 lbs with ball squeeze x 15 x 2  Seated green band x 15 clam  Sit to stand green band x 15  Recumbent bike for revolutions x 5 min L 1  Single leg balance and pelvic stability used wall for assistance . Compensations noted.  Wall slide x 15  Bridge heels on bolster, green band x 10  Bridge with 1 clam x 10 green band  Bridge with side and externally rotated hips x 10     Manual Therapy:   Neuromuscular re-ed: N/A   Therapeutic Activity: N/A   Modalities: 10 min Rt hip in sidelying cold pack    Self Care: N/A   Consider / progression for next session:    Step ups      PT Short Term Goals - 03/21/21 1607       PT SHORT TERM GOAL #1   Title Pt will be I with initial HEP for  Rt hip ROM, strength in Rt LE    Period Weeks    Status New    Target Date 04/18/21      PT SHORT TERM GOAL #2   Title Pt will be able to complete FOTO and understand results    Time 4    Period Weeks    Status New    Target Date 04/18/21      PT SHORT TERM GOAL #3   Title Pt will be able to walk without crutches in her home and min increase in pain    Time 4    Period Weeks    Status New    Target Date 04/18/21      PT SHORT TERM GOAL #4   Title Pt will be able to return to her apartment if tolerating gait and stairs with or without crutches.    Time 4    Period Weeks    Status New    Target Date 04/18/21               PT Long Term Goals - 03/21/21 1609       PT LONG TERM GOAL #1   Title Patient is independent with advanced HEP    Time 8    Period Weeks    Status New    Target Date 05/16/21      PT LONG TERM GOAL #2   Title Pt will be able to score FOTO TBA    Time 8    Period Weeks    Status New    Target Date 05/16/21      PT LONG TERM  GOAL #3   Title Pt will be able to lead with her Rt LE when walking up stairs in her home 50% of the time with pain minimal    Time 8    Period Weeks    Status New    Target Date 05/16/21      PT LONG TERM GOAL #4   Title Pt will be able to walk with full stride and no increase in pain for 1000 feet with no device    Time 8    Period Weeks    Status New    Target Date 05/16/21      PT LONG TERM GOAL #5   Title Pt will be able to demo strength in Rt hip to 4/5 in all planes    Time 8    Period Weeks    Status New    Target Date 05/16/21                   Plan - 03/31/21 1009     Clinical Impression Statement Patient is no longer using the brace for ambulation and only 1 crutch.  She is progressing well with minimal pain along Rt lateral hip.  Her standing compensations on her Rt LE include Rt anterior pelvic tilt and some minor rotation to L. Will continue to advance HEP and gait.    PT Treatment/Interventions ADLs/Self Care Home Management;Biofeedback;Cryotherapy;Electrical Stimulation;Iontophoresis 4mg /ml Dexamethasone;Moist Heat;Ultrasound;Neuromuscular re-education;Therapeutic exercise;Therapeutic activities;Patient/family education;Manual techniques;Dry needling;Taping;Spinal Manipulations;Joint Manipulations;Aquatic Therapy;Passive range of motion    PT Next Visit Plan closed chain strength as tolerated, step ups    PT Home Exercise Plan Access Code: [email or web address]    Consulted and Agree with Plan of Care Patient             Patient will benefit from skilled therapeutic intervention in order to improve the following  deficits and impairments:  Decreased coordination, Increased fascial restricitons, Decreased endurance, Decreased activity tolerance, Dizziness, Pain, Decreased strength, Decreased mobility, Decreased range of motion, Hypermobility  Visit Diagnosis: Pain in right hip  Difficulty in walking, not elsewhere classified  Muscle weakness  (generalized)  Hypermobility syndrome  Labral tear of hip, degenerative     Problem List Patient Active Problem List   Diagnosis Date Noted   Headache 11/30/2020   Positive ANA (antinuclear antibody) 11/15/2020   Fibromyalgia 11/02/2020   Hair loss 11/02/2020   Muscle spasticity 10/22/2020   Rash and other nonspecific skin eruption 10/22/2020   Paresthesia of bilateral legs 10/06/2020   Myalgia 10/06/2020   Chronic pelvic pain in female 10/06/2020   Chronic abdominal pain 10/06/2020   IBS (irritable bowel syndrome) 08/02/2020   Pelvic floor dysfunction 08/02/2020   Irritant dermatitis 08/02/2020   Gastroesophageal reflux disease 06/28/2020   Severe recurrent major depression without psychotic features (HCC) 06/28/2020   Polyarthralgia 06/28/2020   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 06/28/2020   Tachycardia 06/28/2020   History of COVID-19 06/28/2020   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 deficit 06/28/2020   Recurrent major depressive disorder, in full remission (HCC) 06/28/2020   Atypical chest pain 06/22/2020   Dizziness 06/22/2020   SOB (shortness of breath) 06/22/2020   Palpitations 06/14/2020   Anxiety 05/04/2020   Internal and external hemorrhoids without complication 04/29/2020   ETD (Eustachian tube dysfunction), bilateral 10/21/2019   Acne vulgaris 03/11/2014   Mild intermittent asthma without complication 03/11/2014   Seasonal allergies 03/11/2014    Sujata Maines, PT 03/31/2021, 1:01 PM  [facility] Outpatient Rehabilitation [facility] [address] [place], [place], [address] Phone: [phone]   Fax:  [phone]  Name: Amelda Hapke MRN: [id number] Date of Birth: [birth year]   Karie Mainland, PT 03/31/21 1:01 PM Phone: [phone] Fax: [phone]

## 2021-04-04 ENCOUNTER — Ambulatory Visit: Payer: BC Managed Care – PPO | Admitting: Physical Therapy

## 2021-04-04 ENCOUNTER — Other Ambulatory Visit: Payer: Self-pay

## 2021-04-04 DIAGNOSIS — M25551 Pain in right hip: Secondary | ICD-10-CM | POA: Diagnosis not present

## 2021-04-04 DIAGNOSIS — M24159 Other articular cartilage disorders, unspecified hip: Secondary | ICD-10-CM

## 2021-04-04 DIAGNOSIS — R262 Difficulty in walking, not elsewhere classified: Secondary | ICD-10-CM

## 2021-04-04 DIAGNOSIS — M357 Hypermobility syndrome: Secondary | ICD-10-CM | POA: Diagnosis not present

## 2021-04-04 DIAGNOSIS — M6281 Muscle weakness (generalized): Secondary | ICD-10-CM | POA: Diagnosis not present

## 2021-04-04 NOTE — Therapy (Signed)
**Note De-Identified Brianna Obfuscation** [facility] Outpatient Rehabilitation [facility] [address] [place], [place], [address] Phone: [phone]   Fax:  [phone]  Physical Therapy Treatment  Patient Details  Name: Brianna [name] MRN: [id number] Date of Birth: [birth year] Referring Provider (PT): Dr. [name]   Encounter Date: 04/04/2021   PT End of Session - 04/04/21 1337     Visit Number 11    Number of Visits 23    Date for PT Re-Evaluation 05/16/21    Authorization Type BCBS    PT Start Time 1329    PT Stop Time 1415    PT Time Calculation (min) 46 min    Activity Tolerance Patient tolerated treatment well    Behavior During Therapy [facility] for tasks assessed/performed             Past Medical History:  Diagnosis Date   Acute recurrent pansinusitis 10/21/2019   ADHD (attention deficit hyperactivity disorder)    inattentive   Anemia    Anxiety    Asthma    Bronchitis    Cat allergy due to both airborne and skin contact 05/01/2016   Chronic sore throat 05/01/2016   COVID 2020   Depression    Fibromyalgia    Gastroparesis    GERD (gastroesophageal reflux disease)    Hypermobile Ehlers-Danlos syndrome    Irregular heart rate    Keratosis pilaris    Migraine    Seasonal allergic rhinitis 03/11/2014    Past Surgical History:  Procedure Laterality Date   ESOPHAGOGASTRODUODENOSCOPY ENDOSCOPY     03/2020   JOINT REPLACEMENT     LABRAL REPAIR Right 03/02/2021   Procedure: RIGHT HIP ARTHROSCOPY WITH LABRAL REPAIR AND PINCE DEBRIDEMENT;  Surgeon: [name], MD;  Location: [facility] OR;  Service: Orthopedics;  Laterality: Right;   SMART PILL PROCEDURE     12/2020   TYMPANOSTOMY TUBE PLACEMENT Bilateral    WISDOM TOOTH EXTRACTION      There were no vitals filed for this visit.   Subjective Assessment - 04/04/21 1331     Subjective Moved back home this weekend and that went well.  No more hip soreness (since brace removed).  Going out of town tomorrow for pain mgmt and  then mental health.    Patient Stated Goals reduce pelvic floor pain and increase strength. 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 No/denies               Pilates Reformer used for LE/core strength, postural strength, lumbopelvic disassociation and core control.  Exercises included:   Footwork 2 red 1 blue              Double leg parallel heels with ball  Double leg parallel heels no ball              Turnout heels then toes     Added soft Pilates ball under pelvis for round 2 of each of the above exercises.  Pain developed in Rt low back and post hip , stopped when ball removed.    Heel raises in parallel  2 red 1 blue x 15   No extra time to initially position due to discomfort in Rt hip flexion as previous       Feet in straps 1 red 1 yellow Arcs in  parallel x 10 Arcs in slight hip ER x 10    Circles x 8 each direction   Supine bridging all springs with blue band x 10 and then abduction/ER alternating x  10       PT Short Term Goals - 03/21/21 1607       PT SHORT TERM GOAL #1   Title Pt will be I with initial HEP for Rt hip ROM, strength in Rt LE    Period Weeks    Status New    Target Date 04/18/21      PT SHORT TERM GOAL #2   Title Pt will be able to complete FOTO and understand results    Time 4    Period Weeks    Status New    Target Date 04/18/21      PT SHORT TERM GOAL #3   Title Pt will be able to walk without crutches in her home and min increase in pain    Time 4    Period Weeks    Status New    Target Date 04/18/21      PT SHORT TERM GOAL #4   Title Pt will be able to return to her apartment if tolerating gait and stairs with or without crutches.    Time 4    Period Weeks    Status New    Target Date 04/18/21               PT Long Term Goals - 03/21/21 1609        PT LONG TERM GOAL #1   Title Patient is independent with advanced HEP    Time 8    Period Weeks    Status New    Target Date 05/16/21      PT LONG TERM GOAL #2   Title Pt will be able to score FOTO TBA    Time 8    Period Weeks    Status New    Target Date 05/16/21      PT LONG TERM GOAL #3   Title Pt will be able to lead with her Rt LE when walking up stairs in her home 50% of the time with pain minimal    Time 8    Period Weeks    Status New    Target Date 05/16/21      PT LONG TERM GOAL #4   Title Pt will be able to walk with full stride and no increase in pain for 1000 feet with no device    Time 8    Period Weeks    Status New    Target Date 05/16/21      PT LONG TERM GOAL #5   Title Pt will be able to demo strength in Rt hip to 4/5 in all planes    Time 8    Period Weeks    Status New    Target Date 05/16/21                   Plan - 04/04/21 1337     Clinical Impression Statement Brianna [name] used the Pilates Reformer for hip strength and stability.  She had a "twinge" in her Rt hip flexor with hip circles, brief.  She was not uncomfortable with the positioning as it was last session.  She showed good sense of control and proprioception during the session, needed cues for remaining in neutral spine during supine footwork    PT Treatment/Interventions ADLs/Self  Care Home Management;Biofeedback;Cryotherapy;Electrical Stimulation;Iontophoresis 4mg /ml Dexamethasone;Moist Heat;Ultrasound;Neuromuscular re-education;Therapeutic exercise;Therapeutic activities;Patient/family education;Manual techniques;Dry needling;Taping;Spinal Manipulations;Joint Manipulations;Aquatic Therapy;Passive range of motion    PT Next Visit Plan closed chain strength as tolerated, step ups    PT Home Exercise Plan Access Code: [email or web address]    Consulted and Agree with Plan of Care Patient             Patient will benefit from skilled therapeutic intervention in order to improve the  following deficits and impairments:  Decreased coordination, Increased fascial restricitons, Decreased endurance, Decreased activity tolerance, Dizziness, Pain, Decreased strength, Decreased mobility, Decreased range of motion, Hypermobility  Visit Diagnosis: Pain in right hip  Difficulty in walking, not elsewhere classified  Muscle weakness (generalized)  Hypermobility syndrome  Labral tear of hip, degenerative     Problem List Patient Active Problem List   Diagnosis Date Noted   Headache 11/30/2020   Positive ANA (antinuclear antibody) 11/15/2020   Fibromyalgia 11/02/2020   Hair loss 11/02/2020   Muscle spasticity 10/22/2020   Rash and other nonspecific skin eruption 10/22/2020   Paresthesia of bilateral legs 10/06/2020   Myalgia 10/06/2020   Chronic pelvic pain in female 10/06/2020   Chronic abdominal pain 10/06/2020   IBS (irritable bowel syndrome) 08/02/2020   Pelvic floor dysfunction 08/02/2020   Irritant dermatitis 08/02/2020   Gastroesophageal reflux disease 06/28/2020   Severe recurrent major depression without psychotic features (HCC) 06/28/2020   Polyarthralgia 06/28/2020   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 06/28/2020   Tachycardia 06/28/2020   History of COVID-19 06/28/2020   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 deficit 06/28/2020   Recurrent major depressive disorder, in full remission (HCC) 06/28/2020   Atypical chest pain 06/22/2020   Dizziness 06/22/2020   SOB (shortness of breath) 06/22/2020   Palpitations 06/14/2020   Anxiety 05/04/2020   Internal and external hemorrhoids without complication 04/29/2020   ETD (Eustachian tube dysfunction), bilateral 10/21/2019   Acne vulgaris 03/11/2014   Mild intermittent asthma without complication 03/11/2014   Seasonal allergies 03/11/2014    Fiana Gladu, PT 04/04/2021, 2:42 PM  [facility] Outpatient Rehabilitation [facility] [address] [place], [place], [place] Phone: [phone]   Fax:  [phone]  Name: Brianna [name] MRN: [name] Date of Birth: 1995-07-29  12/19/1995, PT 04/04/21 2:43 PM Phone: [phone] Fax: [phone]

## 2021-04-05 DIAGNOSIS — M797 Fibromyalgia: Secondary | ICD-10-CM | POA: Diagnosis not present

## 2021-04-05 DIAGNOSIS — F411 Generalized anxiety disorder: Secondary | ICD-10-CM | POA: Diagnosis not present

## 2021-04-05 DIAGNOSIS — F32A Depression, unspecified: Secondary | ICD-10-CM | POA: Diagnosis not present

## 2021-04-05 DIAGNOSIS — Q796 Ehlers-Danlos syndrome, unspecified: Secondary | ICD-10-CM | POA: Diagnosis not present

## 2021-04-05 DIAGNOSIS — G894 Chronic pain syndrome: Secondary | ICD-10-CM | POA: Diagnosis not present

## 2021-04-06 ENCOUNTER — Encounter: Payer: BC Managed Care – PPO | Admitting: Physical Therapy

## 2021-04-06 DIAGNOSIS — K3184 Gastroparesis: Secondary | ICD-10-CM | POA: Diagnosis not present

## 2021-04-06 DIAGNOSIS — R110 Nausea: Secondary | ICD-10-CM | POA: Diagnosis not present

## 2021-04-07 ENCOUNTER — Encounter: Payer: Self-pay | Admitting: Physical Therapy

## 2021-04-07 ENCOUNTER — Other Ambulatory Visit: Payer: Self-pay

## 2021-04-07 ENCOUNTER — Ambulatory Visit: Payer: BC Managed Care – PPO | Admitting: Physical Therapy

## 2021-04-07 ENCOUNTER — Encounter: Payer: BC Managed Care – PPO | Admitting: Physical Medicine and Rehabilitation

## 2021-04-07 DIAGNOSIS — M357 Hypermobility syndrome: Secondary | ICD-10-CM | POA: Diagnosis not present

## 2021-04-07 DIAGNOSIS — M6281 Muscle weakness (generalized): Secondary | ICD-10-CM | POA: Diagnosis not present

## 2021-04-07 DIAGNOSIS — M25551 Pain in right hip: Secondary | ICD-10-CM

## 2021-04-07 DIAGNOSIS — M24159 Other articular cartilage disorders, unspecified hip: Secondary | ICD-10-CM | POA: Diagnosis not present

## 2021-04-07 DIAGNOSIS — R262 Difficulty in walking, not elsewhere classified: Secondary | ICD-10-CM | POA: Diagnosis not present

## 2021-04-07 NOTE — Therapy (Signed)
[facility] Outpatient Rehabilitation [facility] [address] [place], [place], [address] Phone: [phone]   Fax:  [phone]  Physical Therapy Treatment  Patient Details  Name: Brianna [name] MRN: [id number] Date of Birth: [birth year] Referring Provider (PT): Dr. [name]   Encounter Date: 04/07/2021   PT End of Session - 04/07/21 1204     Visit Number 12    Number of Visits 23    Date for PT Re-Evaluation 05/16/21    Authorization Type BCBS    PT Start Time 1145    PT Stop Time 1235    PT Time Calculation (min) 50 min    Activity Tolerance Patient tolerated treatment well    Behavior During Therapy [facility] for tasks assessed/performed             Past Medical History:  Diagnosis Date   Acute recurrent pansinusitis 10/21/2019   ADHD (attention deficit hyperactivity disorder)    inattentive   Anemia    Anxiety    Asthma    Bronchitis    Cat allergy due to both airborne and skin contact 05/01/2016   Chronic sore throat 05/01/2016   COVID 2020   Depression    Fibromyalgia    Gastroparesis    GERD (gastroesophageal reflux disease)    Hypermobile Ehlers-Danlos syndrome    Irregular heart rate    Keratosis pilaris    Migraine    Seasonal allergic rhinitis 03/11/2014    Past Surgical History:  Procedure Laterality Date   ESOPHAGOGASTRODUODENOSCOPY ENDOSCOPY     03/2020   JOINT REPLACEMENT     LABRAL REPAIR Right 03/02/2021   Procedure: RIGHT HIP ARTHROSCOPY WITH LABRAL REPAIR AND PINCE DEBRIDEMENT;  Surgeon: [name], MD;  Location: [facility] OR;  Service: Orthopedics;  Laterality: Right;   SMART PILL PROCEDURE     12/2020   TYMPANOSTOMY TUBE PLACEMENT Bilateral    WISDOM TOOTH EXTRACTION      There were no vitals filed for this visit.   Subjective Assessment - 04/07/21 1156     Subjective Pain is about 3/10.  With exercises it is in the front and sometimes when walking.  the lateral part is still sore to the touch . Pt has to  have her gall bladder out.    Currently in Pain? Yes    Pain Score 3     Pain Location Hip    Pain Orientation Right;Anterior    Pain Descriptors / Indicators Aching;Sore    Pain Type Surgical pain    Pain Onset 1 to 4 weeks ago    Pain Frequency Intermittent    Aggravating Factors  increased WB, hip flexion    Pain Relieving Factors rest, ice                [facility] Adult PT Treatment/Exercise:   Therapeutic Exercise: Recumbent bike L1-L2 for 6 min  Supine bridging x 10 each  Added 6 lbs to Rt hip Added clam  Added march  Single leg  Sit to stand x 15 , 6 lbs dumbbell Squat x 15  Step up, 6 inch, 8 inch x 20  each , good control  Step down (reverse ) x 15 Rt LE  Single leg stance on foam pad , x 30 sec  Added SLS and hip abduction x 15 each LE  Single leg hip hinge on foam pad x 10 each LE     Manual Therapy:   Neuromuscular re-ed: N/A   Therapeutic Activity: N/A   Modalities:  Ice pack 5 min Rt hip.    Self Care: N/A   Consider / progression for next session:            PT Short Term Goals - 03/21/21 1607       PT SHORT TERM GOAL #1   Title Pt will be I with initial HEP for Rt hip ROM, strength in Rt LE    Period Weeks    Status New    Target Date 04/18/21      PT SHORT TERM GOAL #2   Title Pt will be able to complete FOTO and understand results    Time 4    Period Weeks    Status New    Target Date 04/18/21      PT SHORT TERM GOAL #3   Title Pt will be able to walk without crutches in her home and min increase in pain    Time 4    Period Weeks    Status New    Target Date 04/18/21      PT SHORT TERM GOAL #4   Title Pt will be able to return to her apartment if tolerating gait and stairs with or without crutches.    Time 4    Period Weeks    Status New    Target Date 04/18/21               PT Long Term Goals - 03/21/21 1609       PT LONG TERM GOAL #1   Title Patient is independent with advanced HEP    Time 8     Period Weeks    Status New    Target Date 05/16/21      PT LONG TERM GOAL #2   Title Pt will be able to score FOTO TBA    Time 8    Period Weeks    Status New    Target Date 05/16/21      PT LONG TERM GOAL #3   Title Pt will be able to lead with her Rt LE when walking up stairs in her home 50% of the time with pain minimal    Time 8    Period Weeks    Status New    Target Date 05/16/21      PT LONG TERM GOAL #4   Title Pt will be able to walk with full stride and no increase in pain for 1000 feet with no device    Time 8    Period Weeks    Status New    Target Date 05/16/21      PT LONG TERM GOAL #5   Title Pt will be able to demo strength in Rt hip to 4/5 in all planes    Time 8    Period Weeks    Status New    Target Date 05/16/21                   Plan - 04/07/21 1227     Clinical Impression Statement Patient with hip stability sufficient for short distance gait on even , indoor surfaces. Normal gait pattern.  Pain increases in anterior hip with activation (short lever) of hip flexors.  Emphasis on glute strength and gradual loading to Rt LE in standing.  Cont POC    PT Treatment/Interventions ADLs/Self Care Home Management;Biofeedback;Cryotherapy;Electrical Stimulation;Iontophoresis 4mg /ml Dexamethasone;Moist Heat;Ultrasound;Neuromuscular re-education;Therapeutic exercise;Therapeutic activities;Patient/family education;Manual techniques;Dry needling;Taping;Spinal Manipulations;Joint Manipulations;Aquatic Therapy;Passive range of motion    PT Next  Visit Plan wants to know then she can drive. closed chain strength as tolerated, step ups    PT Home Exercise Plan Access Code: [email or web address]    Consulted and Agree with Plan of Care Patient             Patient will benefit from skilled therapeutic intervention in order to improve the following deficits and impairments:  Decreased coordination, Increased fascial restricitons, Decreased endurance, Decreased activity  tolerance, Dizziness, Pain, Decreased strength, Decreased mobility, Decreased range of motion, Hypermobility  Visit Diagnosis: Pain in right hip  Difficulty in walking, not elsewhere classified  Muscle weakness (generalized)  Hypermobility syndrome  Labral tear of hip, degenerative     Problem List Patient Active Problem List   Diagnosis Date Noted   Headache 11/30/2020   Positive ANA (antinuclear antibody) 11/15/2020   Fibromyalgia 11/02/2020   Hair loss 11/02/2020   Muscle spasticity 10/22/2020   Rash and other nonspecific skin eruption 10/22/2020   Paresthesia of bilateral legs 10/06/2020   Myalgia 10/06/2020   Chronic pelvic pain in female 10/06/2020   Chronic abdominal pain 10/06/2020   IBS (irritable bowel syndrome) 08/02/2020   Pelvic floor dysfunction 08/02/2020   Irritant dermatitis 08/02/2020   Gastroesophageal reflux disease 06/28/2020   Severe recurrent major depression without psychotic features (HCC) 06/28/2020   Polyarthralgia 06/28/2020   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 06/28/2020   Tachycardia 06/28/2020   History of COVID-19 06/28/2020   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 deficit 06/28/2020   Recurrent major depressive disorder, in full remission (HCC) 06/28/2020   Atypical chest pain 06/22/2020   Dizziness 06/22/2020   SOB (shortness of breath) 06/22/2020   Palpitations 06/14/2020   Anxiety 05/04/2020   Internal and external hemorrhoids without complication 04/29/2020   ETD (Eustachian tube dysfunction), bilateral 10/21/2019   Acne vulgaris 03/11/2014   Mild intermittent asthma without complication 03/11/2014   Seasonal allergies 03/11/2014    Alantra Popoca, PT 04/08/2021, 3:26 PM  [facility] Outpatient Rehabilitation [facility] [address] [place], [place], [address] Phone: [phone]   Fax:  [phone]  Name: Liara Holm MRN: [id number] Date of Birth:  11-Mar-1996   Karie Mainland, PT 04/08/21 3:26 PM Phone: [phone] Fax: [phone]

## 2021-04-08 DIAGNOSIS — F32A Depression, unspecified: Secondary | ICD-10-CM | POA: Diagnosis not present

## 2021-04-08 DIAGNOSIS — F411 Generalized anxiety disorder: Secondary | ICD-10-CM | POA: Diagnosis not present

## 2021-04-11 ENCOUNTER — Other Ambulatory Visit: Payer: Self-pay

## 2021-04-11 ENCOUNTER — Encounter: Payer: Self-pay | Admitting: Physical Therapy

## 2021-04-11 ENCOUNTER — Ambulatory Visit: Payer: BC Managed Care – PPO | Admitting: Physical Therapy

## 2021-04-11 DIAGNOSIS — M6281 Muscle weakness (generalized): Secondary | ICD-10-CM | POA: Diagnosis not present

## 2021-04-11 DIAGNOSIS — R262 Difficulty in walking, not elsewhere classified: Secondary | ICD-10-CM | POA: Diagnosis not present

## 2021-04-11 DIAGNOSIS — M357 Hypermobility syndrome: Secondary | ICD-10-CM

## 2021-04-11 DIAGNOSIS — M24159 Other articular cartilage disorders, unspecified hip: Secondary | ICD-10-CM | POA: Diagnosis not present

## 2021-04-11 DIAGNOSIS — M25551 Pain in right hip: Secondary | ICD-10-CM | POA: Diagnosis not present

## 2021-04-11 NOTE — Therapy (Signed)
[facility] Outpatient Rehabilitation [facility] [address] [place], [place], [address] Phone: [phone]   Fax:  [phone]  Physical Therapy Treatment  Patient Details  Name: Brianna [name] MRN: [id number] Date of Birth: 1996-01-20 Referring Provider (PT): Dr. [name]   Encounter Date: 04/11/2021   PT End of Session - 04/11/21 1346     Visit Number 13    Number of Visits 23    Date for PT Re-Evaluation 05/16/21    Authorization Type BCBS    PT Start Time 1339    PT Stop Time 1428    PT Time Calculation (min) 49 min    Activity Tolerance Patient tolerated treatment well    Behavior During Therapy [facility] for tasks assessed/performed             Past Medical History:  Diagnosis Date   Acute recurrent pansinusitis 10/21/2019   ADHD (attention deficit hyperactivity disorder)    inattentive   Anemia    Anxiety    Asthma    Bronchitis    Cat allergy due to both airborne and skin contact 05/01/2016   Chronic sore throat 05/01/2016   COVID 2020   Depression    Fibromyalgia    Gastroparesis    GERD (gastroesophageal reflux disease)    Hypermobile Ehlers-Danlos syndrome    Irregular heart rate    Keratosis pilaris    Migraine    Seasonal allergic rhinitis 03/11/2014    Past Surgical History:  Procedure Laterality Date   ESOPHAGOGASTRODUODENOSCOPY ENDOSCOPY     03/2020   JOINT REPLACEMENT     LABRAL REPAIR Right 03/02/2021   Procedure: RIGHT HIP ARTHROSCOPY WITH LABRAL REPAIR AND PINCE DEBRIDEMENT;  Surgeon: [name], MD;  Location: [facility] OR;  Service: Orthopedics;  Laterality: Right;   SMART PILL PROCEDURE     12/2020   TYMPANOSTOMY TUBE PLACEMENT Bilateral    WISDOM TOOTH EXTRACTION      There were no vitals filed for this visit.   Subjective Assessment - 04/11/21 1340     Subjective I have a theory, maybe both sides have been impinged and the L one is going to tear too.    Currently in Pain? Yes             [facility]  Adult PT Treatment/Exercise:   Therapeutic Exercise: Step ups x 20 no wgt Step ups 10 lbs  Sit to stand  x 15, 10 lbs  Lateral band walking 6 x 15 feet, partial squat  Diamond green band looped at thighs x 10  Standing high march green band x 10 , used crutch for stability   hip ER and IR ("windshield wipers")   Manual Therapy: PROM Rt hip in supine and in prone  Prone ant capsule stretch , light compression to glutes Light hip distraction Rt LE with sl hip ER AROM measured MMT  Neuromuscular re-ed: N/A   Therapeutic Activity: N/A   Modalities: N/A   Self Care: N/A   Consider / progression for next session:   standing balance stability      PT Education - 04/11/21 1553     Education Details hip ROM, limitations, EDS vs hypermobillity, clinical presentation    Person(s) Educated Patient    Methods Explanation    Comprehension Verbalized understanding              PT Short Term Goals - 04/11/21 1554       PT SHORT TERM GOAL #1   Title Pt will be  I with initial HEP for Rt hip ROM, strength in Rt LE    Status Achieved      PT SHORT TERM GOAL #2   Title Pt will be able to complete FOTO and understand results               PT Long Term Goals - 03/21/21 1609       PT LONG TERM GOAL #1   Title Patient is independent with advanced HEP    Time 8    Period Weeks    Status New    Target Date 05/16/21      PT LONG TERM GOAL #2   Title Pt will be able to score FOTO TBA    Time 8    Period Weeks    Status New    Target Date 05/16/21      PT LONG TERM GOAL #3   Title Pt will be able to lead with her Rt LE when walking up stairs in her home 50% of the time with pain minimal    Time 8    Period Weeks    Status New    Target Date 05/16/21      PT LONG TERM GOAL #4   Title Pt will be able to walk with full stride and no increase in pain for 1000 feet with no device    Time 8    Period Weeks    Status New    Target Date 05/16/21      PT LONG  TERM GOAL #5   Title Pt will be able to demo strength in Rt hip to 4/5 in all planes    Time 8    Period Weeks    Status New    Target Date 05/16/21                   Plan - 04/11/21 1604     Clinical Impression Statement Patient with multiple questions today surrounding her current symptoms of hip clicking  and popping in each hip.  She is concerned that her Lt hip will eventually need a repair too.  She knows she had a repair but feels the Rt hip is still impinged.  She feels if she moves her operated leg out to the side (abd/ER) it feels like it will pop and is painful.  Performed strengthening today and offered light capsular stretching at the end of session.  She plans to drive a short distnce tomorrow, she will be at about 6 weeks post op.  I urged her to ask the MD when she asked me last week and I feel like 6 weeks should be OK for non highway travel for short distances.  Cont POC.  See flowsheet for hip strength/ROM current.    PT Treatment/Interventions ADLs/Self Care Home Management;Biofeedback;Cryotherapy;Electrical Stimulation;Iontophoresis 4mg /ml Dexamethasone;Moist Heat;Ultrasound;Neuromuscular re-education;Therapeutic exercise;Therapeutic activities;Patient/family education;Manual techniques;Dry needling;Taping;Spinal Manipulations;Joint Manipulations;Aquatic Therapy;Passive range of motion    PT Next Visit Plan wants to know then she can drive. closed chain strength as tolerated, step ups    PT Home Exercise Plan Access Code: [email or web address]    Consulted and Agree with Plan of Care Patient             Patient will benefit from skilled therapeutic intervention in order to improve the following deficits and impairments:  Decreased coordination, Increased fascial restricitons, Decreased endurance, Decreased activity tolerance, Dizziness, Pain, Decreased strength, Decreased mobility, Decreased range of motion, Hypermobility  Visit Diagnosis: Pain in right  hip  Difficulty in  walking, not elsewhere classified  Muscle weakness (generalized)  Hypermobility syndrome  Labral tear of hip, degenerative     Problem List Patient Active Problem List   Diagnosis Date Noted   Headache 11/30/2020   Positive ANA (antinuclear antibody) 11/15/2020   Fibromyalgia 11/02/2020   Hair loss 11/02/2020   Muscle spasticity 10/22/2020   Rash and other nonspecific skin eruption 10/22/2020   Paresthesia of bilateral legs 10/06/2020   Myalgia 10/06/2020   Chronic pelvic pain in female 10/06/2020   Chronic abdominal pain 10/06/2020   IBS (irritable bowel syndrome) 08/02/2020   Pelvic floor dysfunction 08/02/2020   Irritant dermatitis 08/02/2020   Gastroesophageal reflux disease 06/28/2020   Severe recurrent major depression without psychotic features (HCC) 06/28/2020   Polyarthralgia 06/28/2020   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 06/28/2020   Tachycardia 06/28/2020   History of COVID-19 06/28/2020   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 deficit 06/28/2020   Recurrent major depressive disorder, in full remission (HCC) 06/28/2020   Atypical chest pain 06/22/2020   Dizziness 06/22/2020   SOB (shortness of breath) 06/22/2020   Palpitations 06/14/2020   Anxiety 05/04/2020   Internal and external hemorrhoids without complication 04/29/2020   ETD (Eustachian tube dysfunction), bilateral 10/21/2019   Acne vulgaris 03/11/2014   Mild intermittent asthma without complication 03/11/2014   Seasonal allergies 03/11/2014    Brianna [name], PT 04/11/2021, 4:11 PM  [facility] Outpatient Rehabilitation [facility] [address] [place], [place], [address] Phone: [phone]   Fax:  [phone]  Name: Brianna [name] MRN: [id number] Date of Birth: [birth year]   Brianna [name], PT 04/11/21 4:13 PM Phone: [phone] Fax: [phone]

## 2021-04-12 ENCOUNTER — Other Ambulatory Visit (HOSPITAL_COMMUNITY): Payer: Self-pay

## 2021-04-12 DIAGNOSIS — F439 Reaction to severe stress, unspecified: Secondary | ICD-10-CM | POA: Diagnosis not present

## 2021-04-12 DIAGNOSIS — F331 Major depressive disorder, recurrent, moderate: Secondary | ICD-10-CM | POA: Diagnosis not present

## 2021-04-12 DIAGNOSIS — F411 Generalized anxiety disorder: Secondary | ICD-10-CM | POA: Diagnosis not present

## 2021-04-12 DIAGNOSIS — G4709 Other insomnia: Secondary | ICD-10-CM | POA: Diagnosis not present

## 2021-04-13 ENCOUNTER — Encounter: Payer: Self-pay | Admitting: Physical Therapy

## 2021-04-13 ENCOUNTER — Ambulatory Visit: Payer: BC Managed Care – PPO | Admitting: Physical Therapy

## 2021-04-13 ENCOUNTER — Other Ambulatory Visit: Payer: Self-pay

## 2021-04-13 DIAGNOSIS — R262 Difficulty in walking, not elsewhere classified: Secondary | ICD-10-CM | POA: Diagnosis not present

## 2021-04-13 DIAGNOSIS — M357 Hypermobility syndrome: Secondary | ICD-10-CM | POA: Diagnosis not present

## 2021-04-13 DIAGNOSIS — S73191A Other sprain of right hip, initial encounter: Secondary | ICD-10-CM | POA: Insufficient documentation

## 2021-04-13 DIAGNOSIS — M791 Myalgia, unspecified site: Secondary | ICD-10-CM | POA: Diagnosis not present

## 2021-04-13 DIAGNOSIS — M6281 Muscle weakness (generalized): Secondary | ICD-10-CM | POA: Diagnosis not present

## 2021-04-13 DIAGNOSIS — M24159 Other articular cartilage disorders, unspecified hip: Secondary | ICD-10-CM | POA: Diagnosis not present

## 2021-04-13 DIAGNOSIS — M21851 Other specified acquired deformities of right thigh: Secondary | ICD-10-CM | POA: Insufficient documentation

## 2021-04-13 DIAGNOSIS — R202 Paresthesia of skin: Secondary | ICD-10-CM | POA: Diagnosis not present

## 2021-04-13 DIAGNOSIS — M25551 Pain in right hip: Secondary | ICD-10-CM | POA: Diagnosis not present

## 2021-04-13 NOTE — Therapy (Signed)
[facility] Outpatient Rehabilitation [facility] [address] [place], [place], [address] Phone: [phone]   Fax:  [phone]  Physical Therapy Treatment  Patient Details  Name: Delisa Finck MRN: [id number] Date of Birth: 02-May-1996 Referring Provider (PT): Dr. [name]   Encounter Date: 04/13/2021   PT End of Session - 04/13/21 1338     Visit Number 14    Date for PT Re-Evaluation 05/16/21    Authorization Type BCBS    PT Start Time 1335    Activity Tolerance Patient tolerated treatment well    Behavior During Therapy [facility] for tasks assessed/performed             Past Medical History:  Diagnosis Date   Acute recurrent pansinusitis 10/21/2019   ADHD (attention deficit hyperactivity disorder)    inattentive   Anemia    Anxiety    Asthma    Bronchitis    Cat allergy due to both airborne and skin contact 05/01/2016   Chronic sore throat 05/01/2016   COVID 2020   Depression    Fibromyalgia    Gastroparesis    GERD (gastroesophageal reflux disease)    Hypermobile Ehlers-Danlos syndrome    Irregular heart rate    Keratosis pilaris    Migraine    Seasonal allergic rhinitis 03/11/2014    Past Surgical History:  Procedure Laterality Date   ESOPHAGOGASTRODUODENOSCOPY ENDOSCOPY     03/2020   JOINT REPLACEMENT     LABRAL REPAIR Right 03/02/2021   Procedure: RIGHT HIP ARTHROSCOPY WITH LABRAL REPAIR AND PINCE DEBRIDEMENT;  Surgeon: [name], MD;  Location: [facility] OR;  Service: Orthopedics;  Laterality: Right;   SMART PILL PROCEDURE     12/2020   TYMPANOSTOMY TUBE PLACEMENT Bilateral    WISDOM TOOTH EXTRACTION      There were no vitals filed for this visit.   Subjective Assessment - 04/13/21 1337     Subjective Walked in today without crutch.  A little bit achy right now.  Having some sharp thigh pain at times.              Pilates Reformer used for LE/core strength, postural strength, lumbopelvic disassociation and core  control.    Exercises included:   Footwork 2 red 1 blue 1 yellow              Double leg parallel heels , turnout on heels and then wide/ER on heels   Double leg toes in parallel, turnout, wide/ER Single leg 2 red 1 blue press out parallel x 10 and then x 10 in hip ER , done bilaterally        Feet in straps 1 red 1 yellow Arcs in parallel x 10 Arcs in slight hip ER x 10  Arcs in internal rotation x 10  Squats x 10 (frog)              Circles x 8 each direction   Knee stretches 1 red  Knees back x 10  Plank hold /press 1 Red x 10 , min cue for controlling lumbar extension     PT Short Term Goals - 04/13/21 1338       PT SHORT TERM GOAL #1   Title Pt will be I with initial HEP for Rt hip ROM, strength in Rt LE    Status Achieved      PT SHORT TERM GOAL #2   Title Pt will be able to complete FOTO and understand results    Status Deferred  PT SHORT TERM GOAL #3   Title Pt will be able to walk without crutches in her home and min increase in pain    Baseline only if > 10-15 min    Status On-going      PT SHORT TERM GOAL #4   Title Pt will be able to return to her apartment if tolerating gait and stairs with or without crutches.    Baseline uses Rt rail , alternating most of the time    Status On-going               PT Long Term Goals - 04/13/21 1342       PT LONG TERM GOAL #1   Title Patient is independent with advanced HEP    Status On-going      PT LONG TERM GOAL #2   Title Pt will be able to score FOTO TBA    Baseline not done    Status Deferred      PT LONG TERM GOAL #3   Title Pt will be able to lead with her Rt LE when walking up stairs in her home 50% of the time with pain minimal    Status Achieved      PT LONG TERM GOAL #4   Title Pt will be able to walk with full stride and no increase in pain for 1000 feet with no device    Status On-going      PT LONG TERM GOAL #5   Title Pt will be able to demo strength in Rt hip to 4/5 in all planes     Status On-going      PT LONG TERM GOAL #6   Title Pt will be able to sit on the floor cross legged and get up without difficulty    Baseline could do this but with increased time and using R UE    Time 8    Period Weeks    Status New    Target Date 05/11/21      PT LONG TERM GOAL #7   Title Pt will be able to participate in community yoga class with modifications    Time 4    Period Weeks    Status New    Target Date 05/11/21                   Plan - 04/13/21 1447     Clinical Impression Statement Patient continues to progress in mobility.  She has made more appts as she progressses through her protocol.  She used Pilates Reformer for LE strength and core control, stability.  No increased pain or difficulty with mid range Rt hip ER/Abduction.    PT Treatment/Interventions ADLs/Self Care Home Management;Biofeedback;Cryotherapy;Electrical Stimulation;Iontophoresis 4mg /ml Dexamethasone;Moist Heat;Ultrasound;Neuromuscular re-education;Therapeutic exercise;Therapeutic activities;Patient/family education;Manual techniques;Dry needling;Taping;Spinal Manipulations;Joint Manipulations;Aquatic Therapy;Passive range of motion    PT Next Visit Plan MD next week. closed chain strength as tolerated, step ups, etc manual Rt thigh if needed    PT Home Exercise Plan Access Code: [email or web address]    Consulted and Agree with Plan of Care Patient             Patient will benefit from skilled therapeutic intervention in order to improve the following deficits and impairments:  Decreased coordination, Increased fascial restricitons, Decreased endurance, Decreased activity tolerance, Dizziness, Pain, Decreased strength, Decreased mobility, Decreased range of motion, Hypermobility  Visit Diagnosis: Pain in right hip  Difficulty in walking, not elsewhere classified  Muscle weakness (generalized)  Hypermobility  syndrome  Labral tear of hip, degenerative     Problem List Patient Active  Problem List   Diagnosis Date Noted   Headache 11/30/2020   Positive ANA (antinuclear antibody) 11/15/2020   Fibromyalgia 11/02/2020   Hair loss 11/02/2020   Muscle spasticity 10/22/2020   Rash and other nonspecific skin eruption 10/22/2020   Paresthesia of bilateral legs 10/06/2020   Myalgia 10/06/2020   Chronic pelvic pain in female 10/06/2020   Chronic abdominal pain 10/06/2020   IBS (irritable bowel syndrome) 08/02/2020   Pelvic floor dysfunction 08/02/2020   Irritant dermatitis 08/02/2020   Gastroesophageal reflux disease 06/28/2020   Severe recurrent major depression without psychotic features (HCC) 06/28/2020   Polyarthralgia 06/28/2020   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 06/28/2020   Tachycardia 06/28/2020   History of COVID-19 06/28/2020   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 deficit 06/28/2020   Recurrent major depressive disorder, in full remission (HCC) 06/28/2020   Atypical chest pain 06/22/2020   Dizziness 06/22/2020   SOB (shortness of breath) 06/22/2020   Palpitations 06/14/2020   Anxiety 05/04/2020   Internal and external hemorrhoids without complication 04/29/2020   ETD (Eustachian tube dysfunction), bilateral 10/21/2019   Acne vulgaris 03/11/2014   Mild intermittent asthma without complication 03/11/2014   Seasonal allergies 03/11/2014    Kaelyn Innocent, PT 04/13/2021, 3:08 PM  [facility] Outpatient Rehabilitation [facility] [address] [place], [place], [address] Phone: [phone]   Fax:  [phone]  Name: Jerra Huckeby MRN: [id number] Date of Birth: 1995-12-04   Karie Mainland, PT 04/13/21 3:08 PM Phone: [phone] Fax: [phone]

## 2021-04-18 DIAGNOSIS — F411 Generalized anxiety disorder: Secondary | ICD-10-CM | POA: Diagnosis not present

## 2021-04-18 DIAGNOSIS — F3341 Major depressive disorder, recurrent, in partial remission: Secondary | ICD-10-CM | POA: Diagnosis not present

## 2021-04-18 DIAGNOSIS — G894 Chronic pain syndrome: Secondary | ICD-10-CM | POA: Diagnosis not present

## 2021-04-18 DIAGNOSIS — F32A Depression, unspecified: Secondary | ICD-10-CM | POA: Diagnosis not present

## 2021-04-19 ENCOUNTER — Other Ambulatory Visit: Payer: Self-pay

## 2021-04-19 ENCOUNTER — Ambulatory Visit (INDEPENDENT_AMBULATORY_CARE_PROVIDER_SITE_OTHER): Payer: BC Managed Care – PPO | Admitting: Orthopaedic Surgery

## 2021-04-19 DIAGNOSIS — S73191D Other sprain of right hip, subsequent encounter: Secondary | ICD-10-CM

## 2021-04-19 NOTE — Progress Notes (Signed)
Post Operative Evaluation    Procedure/Date of Surgery: Right hip arthroscopy with labral repair, pincer debridement 03/02/21  Interval History:   04/19/2021: Brianna [name] presents today 6 weeks status post the above procedure.  Overall she is improving.  She is having some achiness and a slight catch in the front of the hip.  She is improving with her strength although this is coming slowly.  She is fully weightbearing at this time.  She is scheduled to see her GI doctor for discussion of possible cholecystectomy:  PMH/PSH/Family History/Social History/Meds/Allergies:    Past Medical History:  Diagnosis Date   Acute recurrent pansinusitis 10/21/2019   ADHD (attention deficit hyperactivity disorder)    inattentive   Anemia    Anxiety    Asthma    Bronchitis    Cat allergy due to both airborne and skin contact 05/01/2016   Chronic sore throat 05/01/2016   COVID 2020   Depression    Fibromyalgia    Gastroparesis    GERD (gastroesophageal reflux disease)    Hypermobile Ehlers-Danlos syndrome    Irregular heart rate    Keratosis pilaris    Migraine    Seasonal allergic rhinitis 03/11/2014   Past Surgical History:  Procedure Laterality Date   ESOPHAGOGASTRODUODENOSCOPY ENDOSCOPY     03/2020   JOINT REPLACEMENT     LABRAL REPAIR Right 03/02/2021   Procedure: RIGHT HIP ARTHROSCOPY WITH LABRAL REPAIR AND PINCE DEBRIDEMENT;  Surgeon: [name], MD;  Location: [facility] OR;  Service: Orthopedics;  Laterality: Right;   SMART PILL PROCEDURE     12/2020   TYMPANOSTOMY TUBE PLACEMENT Bilateral    WISDOM TOOTH EXTRACTION     Social History   Socioeconomic History   Marital status: Single    Spouse name: Not on file   Number of children: Not on file   Years of education: Not on file   Highest education level: Not on file  Occupational History   Not on file  Tobacco Use   Smoking status: Former    Types: E-cigarettes, Cigarettes    Quit date:  2021    Years since quitting: 1.9   Smokeless tobacco: Never  Vaping Use   Vaping Use: Former   Substances: Financial trader  Substance and Sexual Activity   Alcohol use: Not Currently   Drug use: Yes    Types: Marijuana    Comment: last use 7 days ago   Sexual activity: Yes    Birth control/protection: None  Other Topics Concern   Not on file  Social History Narrative   Right handed   Social Determinants of Health   Financial Resource Strain: Not on file  Food Insecurity: Not on file  Transportation Needs: Not on file  Physical Activity: Not on file  Stress: Not on file  Social Connections: Not on file   Family History  Problem Relation Age of Onset   Diabetes Mother    Polycystic ovary syndrome Mother    GER disease Father    Heart disease Paternal Uncle    Allergies  Allergen Reactions   Clindamycin Hives, Swelling and Rash   Current Outpatient Medications  Medication Sig Dispense Refill   traMADol (ULTRAM) 50 MG tablet Take 1 tablet (50 mg total) by mouth every 12 (twelve) hours as needed for severe pain. 30 tablet 0  Acetylcysteine (NAC) 600 MG CAPS Take 1 capsule (600 mg total) by mouth 2 (two) times daily. (Patient not taking: Reported on 03/08/2021) 60 capsule 0   albuterol (VENTOLIN HFA) 108 (90 Base) MCG/ACT inhaler Inhale 1 puff into the lungs every 6 (six) hours as needed for shortness of breath or wheezing. (Patient not taking: Reported on 03/08/2021)     aspirin EC 325 MG tablet Take 1 tablet (325 mg total) by mouth daily. 30 tablet 0   Cholecalciferol (VITAMIN D3 PO) Take 1 capsule by mouth every 3 (three) days.     dicyclomine (BENTYL) 10 MG capsule Take 10 mg by mouth 3 (three) times daily.     Ferrous Sulfate Dried (EQ SLOW-RELEASE IRON) 45 MG TBCR Take 1 tablet by mouth every 3 (three) days.     fluticasone (FLONASE) 50 MCG/ACT nasal spray SPRAY 1 SPRAY INTO BOTH NOSTRILS DAILY. (Patient taking differently: Place 1 spray into both nostrils 2  (two) times daily.) 16 mL 1   loratadine (CLARITIN) 10 MG tablet Take 1 tablet (10 mg total) by mouth daily. 90 tablet 1   Multiple Vitamins-Minerals (ADULT GUMMY PO) Take 2 capsules by mouth daily.     ondansetron (ZOFRAN ODT) 4 MG disintegrating tablet Take 1 tablet (4 mg total) by mouth every 8 (eight) hours as needed for nausea or vomiting. 20 tablet 5   OVER THE COUNTER MEDICATION Apply 1 application topically daily as needed (pain). CBD menthol muscle rub     pregabalin (LYRICA) 25 MG capsule Take 25 mg by mouth daily.     promethazine (PHENERGAN) 25 MG tablet Take 1 tablet (25 mg total) by mouth every 6 (six) hours as needed for nausea or vomiting. 30 tablet 0   Prucalopride Succinate 2 MG TABS Take 2 mg by mouth daily.     RABEprazole (ACIPHEX) 20 MG tablet Take 20 mg by mouth 2 (two) times daily.     scopolamine (TRANSDERM-SCOP, 1.5 MG,) 1 MG/3DAYS Place 1 patch (1.5 mg total) onto the skin every 3 (three) days. 4 patch 0   No current facility-administered medications for this visit.   No results found.  Review of Systems:   A ROS was performed including pertinent positives and negatives as documented in the HPI.   Musculoskeletal Exam:     Right hip incisions are well healed.  External rotation is to approximately 35 degrees versus 45 on the contralateral side.  Internal rotation is to  Imaging:    None  I personally reviewed and interpreted the radiographs.   Assessment:   25 year old female status post right hip arthroscopy.  She is currently satisfied with her outcome and continues to improve.  I stated that at the 6-week appointment, it is not uncommon to have some anterior based hip pain which is result of weakness of the muscles surrounding the hip.  I advised that she continue to progress her strengthening program of the right hip. Plan :    -Return to clinic in 6 weeks   I personally saw and evaluated the patient, and participated in the management and  treatment plan.  [name], MD Attending Physician, Orthopedic Surgery  This document was dictated using Dragon voice recognition software. A reasonable attempt at proof reading has been made to minimize errors.

## 2021-04-21 DIAGNOSIS — R42 Dizziness and giddiness: Secondary | ICD-10-CM | POA: Diagnosis not present

## 2021-04-21 DIAGNOSIS — Q796 Ehlers-Danlos syndrome, unspecified: Secondary | ICD-10-CM | POA: Diagnosis not present

## 2021-04-22 DIAGNOSIS — R42 Dizziness and giddiness: Secondary | ICD-10-CM | POA: Diagnosis not present

## 2021-04-22 DIAGNOSIS — R002 Palpitations: Secondary | ICD-10-CM | POA: Diagnosis not present

## 2021-04-22 DIAGNOSIS — R109 Unspecified abdominal pain: Secondary | ICD-10-CM | POA: Diagnosis not present

## 2021-04-25 DIAGNOSIS — Z833 Family history of diabetes mellitus: Secondary | ICD-10-CM | POA: Diagnosis not present

## 2021-04-25 DIAGNOSIS — L659 Nonscarring hair loss, unspecified: Secondary | ICD-10-CM | POA: Diagnosis not present

## 2021-04-25 DIAGNOSIS — M797 Fibromyalgia: Secondary | ICD-10-CM | POA: Diagnosis not present

## 2021-04-25 DIAGNOSIS — L700 Acne vulgaris: Secondary | ICD-10-CM | POA: Diagnosis not present

## 2021-04-25 DIAGNOSIS — E282 Polycystic ovarian syndrome: Secondary | ICD-10-CM | POA: Diagnosis not present

## 2021-04-27 ENCOUNTER — Ambulatory Visit
Payer: BC Managed Care – PPO | Attending: Student in an Organized Health Care Education/Training Program | Admitting: Physical Therapy

## 2021-04-27 ENCOUNTER — Other Ambulatory Visit: Payer: Self-pay

## 2021-04-27 ENCOUNTER — Encounter: Payer: Self-pay | Admitting: Physical Therapy

## 2021-04-27 DIAGNOSIS — M6281 Muscle weakness (generalized): Secondary | ICD-10-CM

## 2021-04-27 DIAGNOSIS — R262 Difficulty in walking, not elsewhere classified: Secondary | ICD-10-CM | POA: Diagnosis not present

## 2021-04-27 DIAGNOSIS — M25551 Pain in right hip: Secondary | ICD-10-CM

## 2021-04-27 DIAGNOSIS — M357 Hypermobility syndrome: Secondary | ICD-10-CM | POA: Diagnosis not present

## 2021-04-27 DIAGNOSIS — M24159 Other articular cartilage disorders, unspecified hip: Secondary | ICD-10-CM

## 2021-04-27 DIAGNOSIS — R109 Unspecified abdominal pain: Secondary | ICD-10-CM | POA: Diagnosis not present

## 2021-04-27 NOTE — Therapy (Signed)
[facility] Outpatient Rehabilitation [facility] [address] [place], [place], [address] Phone: [phone]   Fax:  [phone]  Physical Therapy Treatment  Patient Details  Name: Brianna [name] MRN: [id number] Date of Birth: [birth year] Referring Provider (PT): Dr. [name]   Encounter Date: 04/27/2021   PT End of Session - 04/27/21 1335     Visit Number 15    Number of Visits 23    Date for PT Re-Evaluation 05/16/21    Authorization Type BCBS    PT Start Time 1330    PT Stop Time 1415    PT Time Calculation (min) 45 min    Activity Tolerance Patient tolerated treatment well    Behavior During Therapy [facility] for tasks assessed/performed             Past Medical History:  Diagnosis Date   Acute recurrent pansinusitis 10/21/2019   ADHD (attention deficit hyperactivity disorder)    inattentive   Anemia    Anxiety    Asthma    Bronchitis    Cat allergy due to both airborne and skin contact 05/01/2016   Chronic sore throat 05/01/2016   COVID 2020   Depression    Fibromyalgia    Gastroparesis    GERD (gastroesophageal reflux disease)    Hypermobile Ehlers-Danlos syndrome    Irregular heart rate    Keratosis pilaris    Migraine    Seasonal allergic rhinitis 03/11/2014    Past Surgical History:  Procedure Laterality Date   ESOPHAGOGASTRODUODENOSCOPY ENDOSCOPY     03/2020   JOINT REPLACEMENT     LABRAL REPAIR Right 03/02/2021   Procedure: RIGHT HIP ARTHROSCOPY WITH LABRAL REPAIR AND PINCE DEBRIDEMENT;  Surgeon: [name], MD;  Location: [facility] OR;  Service: Orthopedics;  Laterality: Right;   SMART PILL PROCEDURE     12/2020   TYMPANOSTOMY TUBE PLACEMENT Bilateral    WISDOM TOOTH EXTRACTION      There were no vitals filed for this visit.   Subjective Assessment - 04/27/21 1333     Subjective Achy and heavy.  The lateral hip is starting to get a sharp intermittent pain  (step downs). No assistive devices.    Currently in Pain? Yes     Pain Score 3     Pain Location Hip    Pain Orientation Right;Lateral                  [facility] Adult PT Treatment/Exercise:   Therapeutic Exercise: Recumbant bike  Bridging x 10 double leg  Bridge x 10 single leg  SLR 4 way Rt leg x 10  Flexion x 10  Flexion, supine abd x 10  Extension (prone ) 2 x 10  Abduction, adduction 2 x 10 each  Clam x 15  Squat 2 x 5 lbs DB x 15  Sumo 10 lbs  x 15  Split lunge x 10 each leg  Hinge to SLS with 1 foot on foam pad  Red looped band pulls hip flexion x 10 , 5 sec hold  Hip ext with red band x 10              PT Short Term Goals - 04/13/21 1338       PT SHORT TERM GOAL #1   Title Pt will be I with initial HEP for Rt hip ROM, strength in Rt LE    Status Achieved      PT SHORT TERM GOAL #2   Title Pt will be able to  complete FOTO and understand results    Status Deferred      PT SHORT TERM GOAL #3   Title Pt will be able to walk without crutches in her home and min increase in pain    Baseline only if > 10-15 min    Status On-going      PT SHORT TERM GOAL #4   Title Pt will be able to return to her apartment if tolerating gait and stairs with or without crutches.    Baseline uses Rt rail , alternating most of the time    Status On-going               PT Long Term Goals - 04/13/21 1342       PT LONG TERM GOAL #1   Title Patient is independent with advanced HEP    Status On-going      PT LONG TERM GOAL #2   Title Pt will be able to score FOTO TBA    Baseline not done    Status Deferred      PT LONG TERM GOAL #3   Title Pt will be able to lead with her Rt LE when walking up stairs in her home 50% of the time with pain minimal    Status Achieved      PT LONG TERM GOAL #4   Title Pt will be able to walk with full stride and no increase in pain for 1000 feet with no device    Status On-going      PT LONG TERM GOAL #5   Title Pt will be able to demo strength in Rt hip to 4/5 in all planes    Status  On-going      PT LONG TERM GOAL #6   Title Pt will be able to sit on the floor cross legged and get up without difficulty    Baseline could do this but with increased time and using R UE    Time 8    Period Weeks    Status New    Target Date 05/11/21      PT LONG TERM GOAL #7   Title Pt will be able to participate in community yoga class with modifications    Time 4    Period Weeks    Status New    Target Date 05/11/21                   Plan - 04/27/21 1523     Clinical Impression Statement Focused on strengthening today throughout hip. Initiated hip flexion with SLR and standing knee lift.  Min discomfort but can tolerate.  She may be having her gallbladder removed.  Cont to strengthen and progress Rt hip stability.    PT Treatment/Interventions ADLs/Self Care Home Management;Biofeedback;Cryotherapy;Electrical Stimulation;Iontophoresis 4mg /ml Dexamethasone;Moist Heat;Ultrasound;Neuromuscular re-education;Therapeutic exercise;Therapeutic activities;Patient/family education;Manual techniques;Dry needling;Taping;Spinal Manipulations;Joint Manipulations;Aquatic Therapy;Passive range of motion    PT Next Visit Plan closed chain strength as tolerated, step ups, etc manual Rt thigh if needed    PT Home Exercise Plan Access Code: [email or web address]    Consulted and Agree with Plan of Care Patient             Patient will benefit from skilled therapeutic intervention in order to improve the following deficits and impairments:  Decreased coordination, Increased fascial restricitons, Decreased endurance, Decreased activity tolerance, Dizziness, Pain, Decreased strength, Decreased mobility, Decreased range of motion, Hypermobility  Visit Diagnosis: Pain in right hip  Difficulty in walking, not  elsewhere classified  Muscle weakness (generalized)  Hypermobility syndrome  Labral tear of hip, degenerative     Problem List Patient Active Problem List   Diagnosis Date Noted    Headache 11/30/2020   Positive ANA (antinuclear antibody) 11/15/2020   Fibromyalgia 11/02/2020   Hair loss 11/02/2020   Muscle spasticity 10/22/2020   Rash and other nonspecific skin eruption 10/22/2020   Paresthesia of bilateral legs 10/06/2020   Myalgia 10/06/2020   Chronic pelvic pain in female 10/06/2020   Chronic abdominal pain 10/06/2020   IBS (irritable bowel syndrome) 08/02/2020   Pelvic floor dysfunction 08/02/2020   Irritant dermatitis 08/02/2020   Gastroesophageal reflux disease 06/28/2020   Severe recurrent major depression without psychotic features (HCC) 06/28/2020   Polyarthralgia 06/28/2020   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 06/28/2020   Tachycardia 06/28/2020   History of COVID-19 06/28/2020   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 deficit 06/28/2020   Recurrent major depressive disorder, in full remission (HCC) 06/28/2020   Atypical chest pain 06/22/2020   Dizziness 06/22/2020   SOB (shortness of breath) 06/22/2020   Palpitations 06/14/2020   Anxiety 05/04/2020   Internal and external hemorrhoids without complication 04/29/2020   ETD (Eustachian tube dysfunction), bilateral 10/21/2019   Acne vulgaris 03/11/2014   Mild intermittent asthma without complication 03/11/2014   Seasonal allergies 03/11/2014    Jacquie Lukes, PT 04/27/2021, 3:28 PM  [facility] Outpatient Rehabilitation [facility] [address] [place], [place], [address] Phone: [phone]   Fax:  [phone]  Name: Brianna [name] MRN: [id number] Date of Birth: Oct 02, 1995   Karie Mainland, PT 04/27/21 3:28 PM Phone: [phone] Fax: [phone]

## 2021-04-28 DIAGNOSIS — K828 Other specified diseases of gallbladder: Secondary | ICD-10-CM | POA: Diagnosis not present

## 2021-04-28 DIAGNOSIS — R109 Unspecified abdominal pain: Secondary | ICD-10-CM | POA: Diagnosis not present

## 2021-04-29 ENCOUNTER — Encounter: Payer: BC Managed Care – PPO | Admitting: Physical Therapy

## 2021-04-29 DIAGNOSIS — F3341 Major depressive disorder, recurrent, in partial remission: Secondary | ICD-10-CM | POA: Diagnosis not present

## 2021-04-29 DIAGNOSIS — G894 Chronic pain syndrome: Secondary | ICD-10-CM | POA: Diagnosis not present

## 2021-04-29 DIAGNOSIS — E282 Polycystic ovarian syndrome: Secondary | ICD-10-CM | POA: Diagnosis not present

## 2021-04-29 DIAGNOSIS — F411 Generalized anxiety disorder: Secondary | ICD-10-CM | POA: Diagnosis not present

## 2021-05-02 ENCOUNTER — Ambulatory Visit: Payer: BC Managed Care – PPO | Admitting: Physical Therapy

## 2021-05-02 ENCOUNTER — Encounter: Payer: Self-pay | Admitting: Physical Therapy

## 2021-05-02 ENCOUNTER — Other Ambulatory Visit: Payer: Self-pay

## 2021-05-02 DIAGNOSIS — E611 Iron deficiency: Secondary | ICD-10-CM | POA: Diagnosis not present

## 2021-05-02 DIAGNOSIS — M6281 Muscle weakness (generalized): Secondary | ICD-10-CM

## 2021-05-02 DIAGNOSIS — Z13220 Encounter for screening for lipoid disorders: Secondary | ICD-10-CM | POA: Diagnosis not present

## 2021-05-02 DIAGNOSIS — M25551 Pain in right hip: Secondary | ICD-10-CM

## 2021-05-02 DIAGNOSIS — M24159 Other articular cartilage disorders, unspecified hip: Secondary | ICD-10-CM

## 2021-05-02 DIAGNOSIS — Z681 Body mass index (BMI) 19 or less, adult: Secondary | ICD-10-CM | POA: Diagnosis not present

## 2021-05-02 DIAGNOSIS — R262 Difficulty in walking, not elsewhere classified: Secondary | ICD-10-CM | POA: Diagnosis not present

## 2021-05-02 DIAGNOSIS — M357 Hypermobility syndrome: Secondary | ICD-10-CM | POA: Diagnosis not present

## 2021-05-02 DIAGNOSIS — E440 Moderate protein-calorie malnutrition: Secondary | ICD-10-CM | POA: Diagnosis not present

## 2021-05-02 DIAGNOSIS — K828 Other specified diseases of gallbladder: Secondary | ICD-10-CM | POA: Diagnosis not present

## 2021-05-02 DIAGNOSIS — Z0000 Encounter for general adult medical examination without abnormal findings: Secondary | ICD-10-CM | POA: Diagnosis not present

## 2021-05-02 DIAGNOSIS — E559 Vitamin D deficiency, unspecified: Secondary | ICD-10-CM | POA: Diagnosis not present

## 2021-05-02 NOTE — Therapy (Signed)
[facility] Outpatient Rehabilitation [facility] [address] [place], [place], [address] Phone: [phone]   Fax:  [phone]  Physical Therapy Treatment  Patient Details  Name: Brianna [name] MRN: [id number] Date of Birth: 1995-11-18 Referring Provider (PT): Dr. [name]   Encounter Date: 05/02/2021   PT End of Session - 05/02/21 1110     Visit Number 16    Number of Visits 23    Date for PT Re-Evaluation 05/16/21    Authorization Type BCBS    PT Start Time 1105    PT Stop Time 1150    PT Time Calculation (min) 45 min    Activity Tolerance Patient tolerated treatment well    Behavior During Therapy [facility] for tasks assessed/performed             Past Medical History:  Diagnosis Date   Acute recurrent pansinusitis 10/21/2019   ADHD (attention deficit hyperactivity disorder)    inattentive   Anemia    Anxiety    Asthma    Bronchitis    Cat allergy due to both airborne and skin contact 05/01/2016   Chronic sore throat 05/01/2016   COVID 2020   Depression    Fibromyalgia    Gastroparesis    GERD (gastroesophageal reflux disease)    Hypermobile Ehlers-Danlos syndrome    Irregular heart rate    Keratosis pilaris    Migraine    Seasonal allergic rhinitis 03/11/2014    Past Surgical History:  Procedure Laterality Date   ESOPHAGOGASTRODUODENOSCOPY ENDOSCOPY     03/2020   JOINT REPLACEMENT     LABRAL REPAIR Right 03/02/2021   Procedure: RIGHT HIP ARTHROSCOPY WITH LABRAL REPAIR AND PINCE DEBRIDEMENT;  Surgeon: [name], MD;  Location: [facility] OR;  Service: Orthopedics;  Laterality: Right;   SMART PILL PROCEDURE     12/2020   TYMPANOSTOMY TUBE PLACEMENT Bilateral    WISDOM TOOTH EXTRACTION      There were no vitals filed for this visit.   Subjective Assessment - 05/02/21 1107     Subjective Its been really achy lateral and post hip and also on incision.  Maybe from doing more .    Currently in Pain? Yes    Pain Score 5      Pain Location Hip    Pain Orientation Right;Lateral;Posterior    Pain Descriptors / Indicators Aching    Pain Type Surgical pain    Pain Onset More than a month ago    Pain Frequency Intermittent    Aggravating Factors  increased work and exercises    Pain Relieving Factors rest, ice             [facility] Adult PT Treatment/Exercise:   Therapeutic Exercise: Elliptical 5 min L 11 ramp and Level 2 resistance  Supine hamstring curl with ball  Supine bridge legs on ball x 10  Supine bridge with hamstring curl x 10  Standing wall squat with ball x 10 Standing mid range 10 sec isometric hold  Single leg wall squat x 10 (L toe touching floor) done bilat.    Manual Therapy:  IASTM light with manual to Rt anterior thigh , ITB, TFL, glute med and piriformis.  Pin and stretch, compression  PROM Rt hip in prone ER and IR   Neuromuscular re-ed: N/A   Therapeutic Activity: N/A   Modalities: N/A   Self Care: N/A   Consider / progression for next session:         PT Short Term Goals -  04/13/21 1338       PT SHORT TERM GOAL #1   Title Pt will be I with initial HEP for Rt hip ROM, strength in Rt LE    Status Achieved      PT SHORT TERM GOAL #2   Title Pt will be able to complete FOTO and understand results    Status Deferred      PT SHORT TERM GOAL #3   Title Pt will be able to walk without crutches in her home and min increase in pain    Baseline only if > 10-15 min    Status On-going      PT SHORT TERM GOAL #4   Title Pt will be able to return to her apartment if tolerating gait and stairs with or without crutches.    Baseline uses Rt rail , alternating most of the time    Status On-going               PT Long Term Goals - 04/13/21 1342       PT LONG TERM GOAL #1   Title Patient is independent with advanced HEP    Status On-going      PT LONG TERM GOAL #2   Title Pt will be able to score FOTO TBA    Baseline not done    Status Deferred      PT LONG  TERM GOAL #3   Title Pt will be able to lead with her Rt LE when walking up stairs in her home 50% of the time with pain minimal    Status Achieved      PT LONG TERM GOAL #4   Title Pt will be able to walk with full stride and no increase in pain for 1000 feet with no device    Status On-going      PT LONG TERM GOAL #5   Title Pt will be able to demo strength in Rt hip to 4/5 in all planes    Status On-going      PT LONG TERM GOAL #6   Title Pt will be able to sit on the floor cross legged and get up without difficulty    Baseline could do this but with increased time and using R UE    Time 8    Period Weeks    Status New    Target Date 05/11/21      PT LONG TERM GOAL #7   Title Pt will be able to participate in community yoga class with modifications    Time 4    Period Weeks    Status New    Target Date 05/11/21                   Plan - 05/02/21 1116     Clinical Impression Statement Patietn will be having her GB removed 05/09/21 .  She will let us know if she needs to change schedule based on that as well as a potential new job.  She is comfortable for about 30 min in standing and only about 15 min sitting. Used tennis ball and manual therapy to affect hip and muscle achiness.  Only tolerates light touch, especially near scar tissue.    PT Treatment/Interventions ADLs/Self Care Home Management;Biofeedback;Cryotherapy;Electrical Stimulation;Iontophoresis 4mg /ml Dexamethasone;Moist Heat;Ultrasound;Neuromuscular re-education;Therapeutic exercise;Therapeutic activities;Patient/family education;Manual techniques;Dry needling;Taping;Spinal Manipulations;Joint Manipulations;Aquatic Therapy;Passive range of motion    PT Next Visit Plan closed chain strength as tolerated, step ups, etc manual Rt thigh if  needed    PT Home Exercise Plan Access Code: [email or web address]    Consulted and Agree with Plan of Care Patient             Patient will benefit from skilled therapeutic  intervention in order to improve the following deficits and impairments:  Decreased coordination, Increased fascial restricitons, Decreased endurance, Decreased activity tolerance, Dizziness, Pain, Decreased strength, Decreased mobility, Decreased range of motion, Hypermobility  Visit Diagnosis: Pain in right hip  Difficulty in walking, not elsewhere classified  Muscle weakness (generalized)  Hypermobility syndrome  Labral tear of hip, degenerative     Problem List Patient Active Problem List   Diagnosis Date Noted   Headache 11/30/2020   Positive ANA (antinuclear antibody) 11/15/2020   Fibromyalgia 11/02/2020   Hair loss 11/02/2020   Muscle spasticity 10/22/2020   Rash and other nonspecific skin eruption 10/22/2020   Paresthesia of bilateral legs 10/06/2020   Myalgia 10/06/2020   Chronic pelvic pain in female 10/06/2020   Chronic abdominal pain 10/06/2020   IBS (irritable bowel syndrome) 08/02/2020   Pelvic floor dysfunction 08/02/2020   Irritant dermatitis 08/02/2020   Gastroesophageal reflux disease 06/28/2020   Severe recurrent major depression without psychotic features (HCC) 06/28/2020   Polyarthralgia 06/28/2020   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 06/28/2020   Tachycardia 06/28/2020   History of COVID-19 06/28/2020   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 [place], [place], [address] Phone: [phone]   Fax:  [phone]  Name: Brianna [name] MRN: [id number] Date of Birth: 10/12/1995  Karie Mainland, PT 05/02/21 11:57 AM Phone: [phone] Fax: [phone]

## 2021-05-03 ENCOUNTER — Ambulatory Visit (INDEPENDENT_AMBULATORY_CARE_PROVIDER_SITE_OTHER): Payer: BC Managed Care – PPO | Admitting: Family Medicine

## 2021-05-03 DIAGNOSIS — K3184 Gastroparesis: Secondary | ICD-10-CM

## 2021-05-03 DIAGNOSIS — Z713 Dietary counseling and surveillance: Secondary | ICD-10-CM | POA: Diagnosis not present

## 2021-05-03 NOTE — Patient Instructions (Signed)
Goals: 1. After your surgery, continue to eat small, frequent meals, at least 6 times daily, with your first meal within the first hour of being up.  2. Be selective about the fat you include in your diet      Good sources of high-quality fat: XV olive oil, canola oil, avocado, nuts, seeds, fatty fish (source of omega-3s).   3. Limit high-fat, fried, and most fast foods especially after surgery.    Listen to your body for foods you tolerate or don't tolerate.    Remember when you reach out to someone for help, it can be a gift to them as well as a help to you.    Follow-up video visit on Thursday, February 2 at 2:30 PM.
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
## 2021-05-04 DIAGNOSIS — G894 Chronic pain syndrome: Secondary | ICD-10-CM | POA: Diagnosis not present

## 2021-05-04 DIAGNOSIS — F3341 Major depressive disorder, recurrent, in partial remission: Secondary | ICD-10-CM | POA: Diagnosis not present

## 2021-05-04 DIAGNOSIS — F411 Generalized anxiety disorder: Secondary | ICD-10-CM | POA: Diagnosis not present

## 2021-05-05 ENCOUNTER — Encounter: Payer: Self-pay | Admitting: Physical Therapy

## 2021-05-05 ENCOUNTER — Ambulatory Visit: Payer: BC Managed Care – PPO | Admitting: Physical Therapy

## 2021-05-05 ENCOUNTER — Other Ambulatory Visit: Payer: Self-pay

## 2021-05-05 DIAGNOSIS — M6281 Muscle weakness (generalized): Secondary | ICD-10-CM

## 2021-05-05 DIAGNOSIS — F411 Generalized anxiety disorder: Secondary | ICD-10-CM | POA: Diagnosis not present

## 2021-05-05 DIAGNOSIS — M357 Hypermobility syndrome: Secondary | ICD-10-CM

## 2021-05-05 DIAGNOSIS — F32A Depression, unspecified: Secondary | ICD-10-CM | POA: Diagnosis not present

## 2021-05-05 DIAGNOSIS — R262 Difficulty in walking, not elsewhere classified: Secondary | ICD-10-CM

## 2021-05-05 DIAGNOSIS — M25551 Pain in right hip: Secondary | ICD-10-CM | POA: Diagnosis not present

## 2021-05-05 DIAGNOSIS — M24159 Other articular cartilage disorders, unspecified hip: Secondary | ICD-10-CM | POA: Diagnosis not present

## 2021-05-05 NOTE — Therapy (Signed)
[facility] [address] [place], [place], [address] Phone: [phone]   Fax:  [phone]  Physical Therapy Treatment  Patient Details  Name: Brianna [name] MRN: [id number] Date of Birth: 07/03/1995 Referring Provider (PT): Dr. [name]   Encounter Date: 05/05/2021   PT End of Session - 05/05/21 1200     Visit Number 17    Number of Visits 23    Date for PT Re-Evaluation 05/16/21    Authorization Type BCBS    PT Start Time 1152    PT Stop Time 1233    PT Time Calculation (min) 41 min    Activity Tolerance Patient tolerated treatment well    Behavior During Therapy [facility] for tasks assessed/performed             Past Medical History:  Diagnosis Date   Acute recurrent pansinusitis 10/21/2019   ADHD (attention deficit hyperactivity disorder)    inattentive   Anemia    Anxiety    Asthma    Bronchitis    Cat allergy due to both airborne and skin contact 05/01/2016   Chronic sore throat 05/01/2016   COVID 2020   Depression    Fibromyalgia    Gastroparesis    GERD (gastroesophageal reflux disease)    Hypermobile Ehlers-Danlos syndrome    Irregular heart rate    Keratosis pilaris    Migraine    Seasonal allergic rhinitis 03/11/2014    Past Surgical History:  Procedure Laterality Date   ESOPHAGOGASTRODUODENOSCOPY ENDOSCOPY     03/2020   JOINT REPLACEMENT     LABRAL REPAIR Right 03/02/2021   Procedure: RIGHT HIP ARTHROSCOPY WITH LABRAL REPAIR AND PINCE DEBRIDEMENT;  Surgeon: [name], MD;  Location: [facility] OR;  Service: Orthopedics;  Laterality: Right;   SMART PILL PROCEDURE     12/2020   TYMPANOSTOMY TUBE PLACEMENT Bilateral    WISDOM TOOTH EXTRACTION      There were no vitals filed for this visit.   Subjective Assessment - 05/05/21 1158     Subjective Did well in the interview.  Having surgery 05/09/21 (gallbladder). MD said no core engaging for 2 weeks.  Achy, sore today. Pt wonders if  she can still do HEP.    Currently in Pain? Yes    Pain Score 2                 [facility] PT Assessment - 05/05/21 0001       Strength   Right Hip Extension 5/5    Right Hip External Rotation  5/5    Right Hip Internal Rotation 5/5    Right Hip ABduction 4+/5    Right Hip ADduction 4+/5                [facility] Adult PT Treatment/Exercise:   Therapeutic Exercise: Elliptical level 7 ramp and resistance level 2  Supine hamstring and ITB stretching with strap  Supine blue banded bridges x 10    Added march x 10  Added a "sumo" x 10   Sidelying clam blue x 15  Hip ext/abd blue band x 10  Lateral band walks blue band , preferred to have band on thighs to feet  4 x 20 feet Standing lateral lunge x 10 each direction               PT Short Term Goals - 05/05/21 1213       PT SHORT TERM GOAL #1   Title Pt will be I  with initial HEP for Rt hip ROM, strength in Rt LE    Status Achieved      PT SHORT TERM GOAL #2   Title Pt will be able to complete FOTO and understand results    Status Deferred      PT SHORT TERM GOAL #3   Title Pt will be able to walk without crutches in her home and min increase in pain    Status Achieved      PT SHORT TERM GOAL #4   Title Pt will be able to return to her apartment if tolerating gait and stairs with or without crutches.    Baseline uses Rt rail , alternating most of the time    Status Achieved               PT Long Term Goals - 05/05/21 1213       PT LONG TERM GOAL #1   Title Patient is independent with advanced HEP    Status On-going      PT LONG TERM GOAL #2   Title Pt will be able to score FOTO TBA    Status Deferred      PT LONG TERM GOAL #3   Title Pt will be able to lead with her Rt LE when walking up stairs in her home 50% of the time with pain minimal    Status Achieved      PT LONG TERM GOAL #4   Title Pt will be able to walk with full stride and no increase in pain for 1000 feet with no device     Status Achieved      PT LONG TERM GOAL #5   Title Pt will be able to demo strength in Rt hip to 4/5 in all planes    Status On-going      PT LONG TERM GOAL #6   Title Pt will be able to sit on the floor cross legged and get up without difficulty    Baseline has not tried recently    Status On-going      PT LONG TERM GOAL #7   Title Pt will be able to participate in community yoga class with modifications    Status On-going                   Plan - 05/05/21 1209     Clinical Impression Statement Patient with increased Rt hip strength and ROM, progressing well since Pt evaluation.  Has updated HEP. She ill come back to PT about 2 weeks after her gallbladder surgery and continue if needed.  Plan for ERO that date.    PT Treatment/Interventions ADLs/Self Care Home Management;Biofeedback;Cryotherapy;Electrical Stimulation;Iontophoresis 4mg /ml Dexamethasone;Moist Heat;Ultrasound;Neuromuscular re-education;Therapeutic exercise;Therapeutic activities;Patient/family education;Manual techniques;Dry needling;Taping;Spinal Manipulations;Joint Manipulations;Aquatic Therapy;Passive range of motion    PT Next Visit Plan closed chain strength as tolerated, step ups, etc manual Rt thigh if needed    PT Home Exercise Plan Access Code: [email or web address]    Consulted and Agree with Plan of Care Patient             Patient will benefit from skilled therapeutic intervention in order to improve the following deficits and impairments:  Decreased coordination, Increased fascial restricitons, Decreased endurance, Decreased activity tolerance, Dizziness, Pain, Decreased strength, Decreased mobility, Decreased range of motion, Hypermobility  Visit Diagnosis: Pain in right hip  Difficulty in walking, not elsewhere classified  Muscle weakness (generalized)  Hypermobility syndrome     Problem List Patient Active Problem  List   Diagnosis Date Noted   Headache 11/30/2020   Positive ANA  (antinuclear antibody) 11/15/2020   Fibromyalgia 11/02/2020   Hair loss 11/02/2020   Muscle spasticity 10/22/2020   Rash and other nonspecific skin eruption 10/22/2020   Paresthesia of bilateral legs 10/06/2020   Myalgia 10/06/2020   Chronic pelvic pain in female 10/06/2020   Chronic abdominal pain 10/06/2020   IBS (irritable bowel syndrome) 08/02/2020   Pelvic floor dysfunction 08/02/2020   Irritant dermatitis 08/02/2020   Gastroesophageal reflux disease 06/28/2020   Severe recurrent major depression without psychotic features (HCC) 06/28/2020   Polyarthralgia 06/28/2020   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 06/28/2020   Tachycardia 06/28/2020   History of COVID-19 06/28/2020   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 deficit 06/28/2020   Recurrent major depressive disorder, in full remission (HCC) 06/28/2020   Atypical chest pain 06/22/2020   Dizziness 06/22/2020   SOB (shortness of breath) 06/22/2020   Palpitations 06/14/2020   Anxiety 05/04/2020   Internal and external hemorrhoids without complication 04/29/2020   ETD (Eustachian tube dysfunction), bilateral 10/21/2019   Acne vulgaris 03/11/2014   Mild intermittent asthma without complication 03/11/2014   Seasonal allergies 03/11/2014    Toma Erichsen, PT 05/05/2021, 1:28 PM  [facility] [address] [place], [place], [address] Phone: [phone]   Fax:  [phone]  Name: Brianna [name] MRN: [id number] Date of Birth: 1995-11-25   Karie Mainland, PT 05/05/21 1:28 PM Phone: [phone] Fax: [phone]

## 2021-05-06 DIAGNOSIS — L299 Pruritus, unspecified: Secondary | ICD-10-CM | POA: Diagnosis not present

## 2021-05-06 DIAGNOSIS — J45990 Exercise induced bronchospasm: Secondary | ICD-10-CM | POA: Diagnosis not present

## 2021-05-06 DIAGNOSIS — Z87891 Personal history of nicotine dependence: Secondary | ICD-10-CM | POA: Diagnosis not present

## 2021-05-06 DIAGNOSIS — J309 Allergic rhinitis, unspecified: Secondary | ICD-10-CM | POA: Diagnosis not present

## 2021-05-09 ENCOUNTER — Encounter: Payer: BC Managed Care – PPO | Admitting: Physical Therapy

## 2021-05-09 DIAGNOSIS — R110 Nausea: Secondary | ICD-10-CM | POA: Diagnosis not present

## 2021-05-09 DIAGNOSIS — K811 Chronic cholecystitis: Secondary | ICD-10-CM | POA: Diagnosis not present

## 2021-05-09 DIAGNOSIS — K828 Other specified diseases of gallbladder: Secondary | ICD-10-CM | POA: Diagnosis not present

## 2021-05-09 DIAGNOSIS — R1011 Right upper quadrant pain: Secondary | ICD-10-CM | POA: Diagnosis not present

## 2021-05-09 HISTORY — PX: CHOLECYSTECTOMY: SHX55

## 2021-05-10 DIAGNOSIS — R110 Nausea: Secondary | ICD-10-CM | POA: Diagnosis not present

## 2021-05-10 DIAGNOSIS — R1011 Right upper quadrant pain: Secondary | ICD-10-CM | POA: Diagnosis not present

## 2021-05-10 DIAGNOSIS — K828 Other specified diseases of gallbladder: Secondary | ICD-10-CM | POA: Diagnosis not present

## 2021-05-12 DIAGNOSIS — G894 Chronic pain syndrome: Secondary | ICD-10-CM | POA: Diagnosis not present

## 2021-05-12 DIAGNOSIS — F3341 Major depressive disorder, recurrent, in partial remission: Secondary | ICD-10-CM | POA: Diagnosis not present

## 2021-05-12 DIAGNOSIS — F411 Generalized anxiety disorder: Secondary | ICD-10-CM | POA: Diagnosis not present

## 2021-05-12 DIAGNOSIS — F32A Depression, unspecified: Secondary | ICD-10-CM | POA: Diagnosis not present

## 2021-05-17 ENCOUNTER — Encounter: Payer: BC Managed Care – PPO | Admitting: Physical Therapy

## 2021-05-18 DIAGNOSIS — Z8669 Personal history of other diseases of the nervous system and sense organs: Secondary | ICD-10-CM | POA: Diagnosis not present

## 2021-05-19 ENCOUNTER — Ambulatory Visit: Payer: BC Managed Care – PPO | Admitting: Physical Therapy

## 2021-05-23 DIAGNOSIS — F3341 Major depressive disorder, recurrent, in partial remission: Secondary | ICD-10-CM | POA: Diagnosis not present

## 2021-05-23 DIAGNOSIS — G894 Chronic pain syndrome: Secondary | ICD-10-CM | POA: Diagnosis not present

## 2021-05-23 DIAGNOSIS — F411 Generalized anxiety disorder: Secondary | ICD-10-CM | POA: Diagnosis not present

## 2021-05-24 DIAGNOSIS — F411 Generalized anxiety disorder: Secondary | ICD-10-CM | POA: Diagnosis not present

## 2021-05-24 DIAGNOSIS — F32A Depression, unspecified: Secondary | ICD-10-CM | POA: Diagnosis not present

## 2021-05-25 ENCOUNTER — Encounter: Payer: BC Managed Care – PPO | Admitting: Physical Therapy

## 2021-05-26 ENCOUNTER — Ambulatory Visit: Payer: BC Managed Care – PPO | Admitting: Physical Therapy

## 2021-05-26 ENCOUNTER — Encounter: Payer: Self-pay | Admitting: Physical Therapy

## 2021-05-26 ENCOUNTER — Telehealth: Payer: Self-pay | Admitting: Physical Therapy

## 2021-05-26 ENCOUNTER — Other Ambulatory Visit: Payer: Self-pay

## 2021-05-26 ENCOUNTER — Ambulatory Visit
Payer: BC Managed Care – PPO | Attending: Student in an Organized Health Care Education/Training Program | Admitting: Physical Therapy

## 2021-05-26 DIAGNOSIS — R262 Difficulty in walking, not elsewhere classified: Secondary | ICD-10-CM | POA: Diagnosis not present

## 2021-05-26 DIAGNOSIS — M357 Hypermobility syndrome: Secondary | ICD-10-CM | POA: Insufficient documentation

## 2021-05-26 DIAGNOSIS — M25551 Pain in right hip: Secondary | ICD-10-CM | POA: Insufficient documentation

## 2021-05-26 DIAGNOSIS — M6281 Muscle weakness (generalized): Secondary | ICD-10-CM | POA: Diagnosis not present

## 2021-05-26 DIAGNOSIS — M24159 Other articular cartilage disorders, unspecified hip: Secondary | ICD-10-CM | POA: Insufficient documentation

## 2021-05-26 NOTE — Telephone Encounter (Signed)
Called patient, left message regarding her missed apt today at 8:45.  Reminded of her next appt 1/11 and asked that she give [place] a call if she will be unable to make it OR if she would like to finish PT.  Karie Mainland, PT 05/26/21 9:42 AM Phone: [phone] Fax: [phone]

## 2021-05-26 NOTE — Therapy (Signed)
[facility] [address] [place], [place], [address] Phone: [phone]   Fax:  [phone]  Physical Therapy Treatment  Patient Details  Name: Brianna [name] MRN: [id number] Date of Birth: 1995-11-05 Referring Provider (PT): Dr. [name]   Encounter Date: 05/26/2021   PT End of Session - 05/26/21 1112     Visit Number 18    Number of Visits 24    Date for PT Re-Evaluation 07/07/21    Authorization Type BCBS    PT Start Time 1104    PT Stop Time 1145    PT Time Calculation (min) 41 min    Activity Tolerance Patient tolerated treatment well    Behavior During Therapy [facility] for tasks assessed/performed             Past Medical History:  Diagnosis Date   Acute recurrent pansinusitis 10/21/2019   ADHD (attention deficit hyperactivity disorder)    inattentive   Anemia    Anxiety    Asthma    Bronchitis    Cat allergy due to both airborne and skin contact 05/01/2016   Chronic sore throat 05/01/2016   COVID 2020   Depression    Fibromyalgia    Gastroparesis    GERD (gastroesophageal reflux disease)    Hypermobile Ehlers-Danlos syndrome    Irregular heart rate    Keratosis pilaris    Migraine    Seasonal allergic rhinitis 03/11/2014    Past Surgical History:  Procedure Laterality Date   ESOPHAGOGASTRODUODENOSCOPY ENDOSCOPY     03/2020   JOINT REPLACEMENT     LABRAL REPAIR Right 03/02/2021   Procedure: RIGHT HIP ARTHROSCOPY WITH LABRAL REPAIR AND PINCE DEBRIDEMENT;  Surgeon: [name], MD;  Location: [facility] OR;  Service: Orthopedics;  Laterality: Right;   SMART PILL PROCEDURE     12/2020   TYMPANOSTOMY TUBE PLACEMENT Bilateral    WISDOM TOOTH EXTRACTION      There were no vitals filed for this visit.   Subjective Assessment - 05/26/21 1108     Subjective Had gallbladder surgery. Still having symptoms and I'm worried it didnt help.  Starts new job 06/07/21. Hip is definitely hurting for the past  3 weeks. Is about 80-90 % sometimes forgets and then has pain.  Has done some walking and HEP. Constant headaches and then severe behind the eye 1 x per day.    Currently in Pain? Yes    Pain Location Hip    Pain Orientation Right;Posterior;Lateral    Pain Descriptors / Indicators Aching;Sore    Pain Type Surgical pain                [facility] PT Assessment - 05/26/21 0001       Strength   Right Hip Flexion 4+/5    Right Hip External Rotation  5/5    Right Hip Internal Rotation 5/5              [facility] Adult PT Treatment/Exercise:   Therapeutic Exercise: Sidelying hip Clam, reverse clam, for hydrant, hip abduction and sidekick series about 15 each on rt LE  Supine SLR x 20 Rt LE  Supine red loop hip flexion x 10  Quadruped hip extension x 10 , donkey kick x 10 each side , modified on elbows   Self Care: HEP and resources for continued exercise, online    Consider / progression for next session:  cont core, hips and strength overall  PT Short Term Goals - 05/05/21 1213       PT SHORT TERM GOAL #1   Title Pt will be I with initial HEP for Rt hip ROM, strength in Rt LE    Status Achieved      PT SHORT TERM GOAL #2   Title Pt will be able to complete FOTO and understand results    Status Deferred      PT SHORT TERM GOAL #3   Title Pt will be able to walk without crutches in her home and min increase in pain    Status Achieved      PT SHORT TERM GOAL #4   Title Pt will be able to return to her apartment if tolerating gait and stairs with or without crutches.    Baseline uses Rt rail , alternating most of the time    Status Achieved               PT Long Term Goals - 05/05/21 1213       PT LONG TERM GOAL #1   Title Patient is independent with advanced HEP    Status On-going      PT LONG TERM GOAL #2   Title Pt will be able to score FOTO TBA    Status Deferred      PT LONG TERM GOAL #3   Title Pt will be able to lead with her Rt LE  when walking up stairs in her home 50% of the time with pain minimal    Status Achieved      PT LONG TERM GOAL #4   Title Pt will be able to walk with full stride and no increase in pain for 1000 feet with no device    Status Achieved      PT LONG TERM GOAL #5   Title Pt will be able to demo strength in Rt hip to 4/5 in all planes    Status On-going      PT LONG TERM GOAL #6   Title Pt will be able to sit on the floor cross legged and get up without difficulty    Baseline has not tried recently    Status On-going      PT LONG TERM GOAL #7   Title Pt will be able to participate in community yoga class with modifications    Status On-going                   Plan - 05/26/21 1123     Clinical Impression Statement Patient continues to report soreness, pain and intermittent weakness with periods of walking.  She is concerned about starting her new job and her limitations as well as the multiple MD appts she has in place. She would like to try PT for maintenance and overall strengthening for her fatigue, endurance, posture, strengt and stability in order to function in her new job.    Personal Factors and Comorbidities Time since onset of injury/illness/exacerbation;Finances;Sex;Profession;Past/Current Experience    Examination-Activity Limitations Locomotion Level;Toileting;Stand;Lift;Dressing;Squat;Stairs    Examination-Participation Restrictions Occupation;Community Activity;Interpersonal Relationship;Shop    PT Frequency 1x / week    PT Duration 6 weeks    PT Treatment/Interventions ADLs/Self Care Home Management;Biofeedback;Cryotherapy;Electrical Stimulation;Iontophoresis 4mg /ml Dexamethasone;Moist Heat;Ultrasound;Neuromuscular re-education;Therapeutic exercise;Therapeutic activities;Patient/family education;Manual techniques;Dry needling;Taping;Spinal Manipulations;Joint Manipulations;Aquatic Therapy;Passive range of motion    PT Next Visit Plan closed chain strength as  tolerated, step ups, etc manual Rt thigh if needed    PT Home Exercise Plan Access Code:  [email or web address]    Consulted and Agree with Plan of Care Patient             Patient will benefit from skilled therapeutic intervention in order to improve the following deficits and impairments:  Decreased coordination, Increased fascial restricitons, Decreased endurance, Decreased activity tolerance, Dizziness, Pain, Decreased strength, Decreased mobility, Decreased range of motion, Hypermobility  Visit Diagnosis: Pain in right hip  Difficulty in walking, not elsewhere classified  Muscle weakness (generalized)  Hypermobility syndrome  Labral tear of hip, degenerative     Problem List Patient Active Problem List   Diagnosis Date Noted   Headache 11/30/2020   Positive ANA (antinuclear antibody) 11/15/2020   Fibromyalgia 11/02/2020   Hair loss 11/02/2020   Muscle spasticity 10/22/2020   Rash and other nonspecific skin eruption 10/22/2020   Paresthesia of bilateral legs 10/06/2020   Myalgia 10/06/2020   Chronic pelvic pain in female 10/06/2020   Chronic abdominal pain 10/06/2020   IBS (irritable bowel syndrome) 08/02/2020   Pelvic floor dysfunction 08/02/2020   Irritant dermatitis 08/02/2020   Gastroesophageal reflux disease 06/28/2020   Severe recurrent major depression without psychotic features (HCC) 06/28/2020   Polyarthralgia 06/28/2020   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 06/28/2020   Tachycardia 06/28/2020   History of COVID-19 06/28/2020   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 deficit 06/28/2020   Recurrent major depressive disorder, in full remission (HCC) 06/28/2020   Atypical chest pain 06/22/2020   Dizziness 06/22/2020   SOB (shortness of breath) 06/22/2020   Palpitations 06/14/2020   Anxiety 05/04/2020   Internal and external hemorrhoids without complication 04/29/2020   ETD (Eustachian tube dysfunction), bilateral 10/21/2019   Acne  vulgaris 03/11/2014   Mild intermittent asthma without complication 03/11/2014   Seasonal allergies 03/11/2014    Duaa Stelzner, PT 05/26/2021, 11:30 AM  [facility] [address] [place], [place], [address] Phone: [phone]   Fax:  [phone]  Name: Brianna [name] MRN: [id number] Date of Birth: 07/03/1995  Karie Mainland, PT 05/26/21 11:46 AM Phone: [phone] Fax: [phone]

## 2021-05-30 DIAGNOSIS — F3341 Major depressive disorder, recurrent, in partial remission: Secondary | ICD-10-CM | POA: Diagnosis not present

## 2021-05-30 DIAGNOSIS — G894 Chronic pain syndrome: Secondary | ICD-10-CM | POA: Diagnosis not present

## 2021-05-30 DIAGNOSIS — F411 Generalized anxiety disorder: Secondary | ICD-10-CM | POA: Diagnosis not present

## 2021-05-31 ENCOUNTER — Encounter (HOSPITAL_BASED_OUTPATIENT_CLINIC_OR_DEPARTMENT_OTHER): Payer: BC Managed Care – PPO | Admitting: Orthopaedic Surgery

## 2021-06-01 ENCOUNTER — Other Ambulatory Visit: Payer: Self-pay

## 2021-06-01 ENCOUNTER — Ambulatory Visit: Payer: BC Managed Care – PPO | Admitting: Physical Therapy

## 2021-06-01 ENCOUNTER — Encounter: Payer: Self-pay | Admitting: Physical Therapy

## 2021-06-01 DIAGNOSIS — M25551 Pain in right hip: Secondary | ICD-10-CM

## 2021-06-01 DIAGNOSIS — R262 Difficulty in walking, not elsewhere classified: Secondary | ICD-10-CM | POA: Diagnosis not present

## 2021-06-01 DIAGNOSIS — M6281 Muscle weakness (generalized): Secondary | ICD-10-CM

## 2021-06-01 DIAGNOSIS — M357 Hypermobility syndrome: Secondary | ICD-10-CM | POA: Diagnosis not present

## 2021-06-01 DIAGNOSIS — M24159 Other articular cartilage disorders, unspecified hip: Secondary | ICD-10-CM | POA: Diagnosis not present

## 2021-06-01 NOTE — Therapy (Signed)
[place], [place], [address] Phone: [phone]   Fax:  [phone]  Physical Therapy Treatment  Patient Details  Name: Brianna [name] MRN: [id number] Date of Birth: October 25, 1995 Referring Provider (PT): Dr. Vanetta Mulders   Encounter Date: 06/01/2021   PT End of Session - 06/01/21 1559     Visit Number 19    Number of Visits 24    Date for PT Re-Evaluation 07/07/21    Authorization Type BCBS    PT Start Time 0737    PT Stop Time 1640    PT Time Calculation (min) 50 min    Activity Tolerance Patient tolerated treatment well    Behavior During Therapy [facility] for tasks assessed/performed             Past Medical History:  Diagnosis Date   Acute recurrent pansinusitis 10/21/2019   ADHD (attention deficit hyperactivity disorder)    inattentive   Anemia    Anxiety    Asthma    Bronchitis    Cat allergy due to both airborne and skin contact 05/01/2016   Chronic sore throat 05/01/2016   COVID 2020   Depression    Fibromyalgia    Gastroparesis    GERD (gastroesophageal reflux disease)    Hypermobile Ehlers-Danlos syndrome    Irregular heart rate    Keratosis pilaris    Migraine    Seasonal allergic rhinitis 03/11/2014    Past Surgical History:  Procedure Laterality Date   ESOPHAGOGASTRODUODENOSCOPY ENDOSCOPY     03/2020   JOINT REPLACEMENT     LABRAL REPAIR Right 03/02/2021   Procedure: RIGHT HIP ARTHROSCOPY WITH LABRAL REPAIR AND PINCE DEBRIDEMENT;  Surgeon: Vanetta Mulders, MD;  Location: [place];  Service: Orthopedics;  Laterality: Right;   SMART PILL PROCEDURE     12/2020   TYMPANOSTOMY TUBE PLACEMENT Bilateral    WISDOM TOOTH EXTRACTION      There were no vitals filed for this visit.   Subjective Assessment - 06/01/21 1553     Subjective Pt with achy Rt hip, rated 5/10.  Starts work next week. Hip F/U tomorrow and other appts. this week.    Currently in Pain? Yes    Pain  Score 5     Pain Location Hip    Pain Orientation Right    Pain Descriptors / Indicators Aching    Pain Type Surgical pain    Pain Onset More than a month ago    Pain Frequency Intermittent             Recumbent bike 5 min L2   2 rounds Squat 15 lbs x 20  Dead lift 15 lbs x 20  Single leg RDL x 10 lbs x 10 each , wall for UE support  Hip thruster 10 lbs x 10 , 15 lbs for 2nd set    IASTM Rt proximal thigh, scar tissue MET for Rt ant ilium and showed her home technique.       PT Short Term Goals - 05/05/21 1213       PT SHORT TERM GOAL #1   Title Pt will be I with initial HEP for Rt hip ROM, strength in Rt LE    Status Achieved      PT SHORT TERM GOAL #2   Title Pt will be able to complete FOTO and understand results    Status Deferred      PT SHORT TERM GOAL #3   Title Pt will be  able to walk without crutches in her home and min increase in pain    Status Achieved      PT SHORT TERM GOAL #4   Title Pt will be able to return to her apartment if tolerating gait and stairs with or without crutches.    Baseline uses Rt rail , alternating most of the time    Status Achieved               PT Long Term Goals - 05/05/21 1213       PT LONG TERM GOAL #1   Title Patient is independent with advanced HEP    Status On-going      PT LONG TERM GOAL #2   Title Pt will be able to score FOTO TBA    Status Deferred      PT LONG TERM GOAL #3   Title Pt will be able to lead with her Rt LE when walking up stairs in her home 50% of the time with pain minimal    Status Achieved      PT LONG TERM GOAL #4   Title Pt will be able to walk with full stride and no increase in pain for 1000 feet with no device    Status Achieved      PT LONG TERM GOAL #5   Title Pt will be able to demo strength in Rt hip to 4/5 in all planes    Status On-going      PT LONG TERM GOAL #6   Title Pt will be able to sit on the floor cross legged and get up without difficulty    Baseline  has not tried recently    Status On-going      PT LONG TERM GOAL #7   Title Pt will be able to participate in community yoga class with modifications    Status On-going                   Plan - 06/01/21 1618     Clinical Impression Statement Patient able to tolerate closed chain loading to hips without increasing hip pain .  Still achy. Has excellent form with RDLs single leg.  Offered light IASTM to Rt anterior hip, scar tissue. Rt ASIS anterior and inferior and possible outflare.   Used MET to correct.  No increased pain . NO appts scheduled at this point she hopes to get in but unsure with the new job.    PT Treatment/Interventions ADLs/Self Care Home Management;Biofeedback;Cryotherapy;Electrical Stimulation;Iontophoresis 108m/ml Dexamethasone;Moist Heat;Ultrasound;Neuromuscular re-education;Therapeutic exercise;Therapeutic activities;Patient/family education;Manual techniques;Dry needling;Taping;Spinal Manipulations;Joint Manipulations;Aquatic Therapy;Passive range of motion    PT Next Visit Plan closed chain strength as tolerated, step ups, etc manual Rt thigh if needed    PT Home Exercise Plan Access Code: [email or web address]    Consulted and Agree with Plan of Care Patient             Patient will benefit from skilled therapeutic intervention in order to improve the following deficits and impairments:  Decreased coordination, Increased fascial restricitons, Decreased endurance, Decreased activity tolerance, Dizziness, Pain, Decreased strength, Decreased mobility, Decreased range of motion, Hypermobility  Visit Diagnosis: Pain in right hip  Difficulty in walking, not elsewhere classified  Muscle weakness (generalized)  Hypermobility syndrome  Labral tear of hip, degenerative     Problem List Patient Active Problem List   Diagnosis Date Noted   Headache 11/30/2020   Positive ANA (antinuclear antibody) 11/15/2020   Fibromyalgia 11/02/2020   Hair  loss 11/02/2020   Muscle  spasticity 10/22/2020   Rash and other nonspecific skin eruption 10/22/2020   Paresthesia of bilateral legs 10/06/2020   Myalgia 10/06/2020   Chronic pelvic pain in female 10/06/2020   Chronic abdominal pain 10/06/2020   IBS (irritable bowel syndrome) 08/02/2020   Pelvic floor dysfunction 08/02/2020   Irritant dermatitis 08/02/2020   Gastroesophageal reflux disease 06/28/2020   Severe recurrent major depression without psychotic features ([place]) 06/28/2020   Polyarthralgia 06/28/2020   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 06/28/2020   Tachycardia 06/28/2020   History of COVID-19 06/28/2020   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 deficit 06/28/2020   Recurrent major depressive disorder, in full remission ([place]) 06/28/2020   Atypical chest pain 06/22/2020   Dizziness 06/22/2020   SOB (shortness of breath) 06/22/2020   Palpitations 06/14/2020   Anxiety 05/04/2020   Internal and external hemorrhoids without complication 67/54/4920   ETD (Eustachian tube dysfunction), bilateral 10/21/2019   Acne vulgaris 03/11/2014   Mild intermittent asthma without complication 02/25/1218   Seasonal allergies 03/11/2014    Jabori Henegar, PT 06/01/2021, 4:45 PM  [facility] [address] [place], [place], [address] Phone: [phone]   Fax:  [phone]  Name: Brianna [name] MRN: [id number] Date of Birth: 1996/01/19   Raeford Razor, PT 06/01/21 4:45 PM Phone: [phone] Fax: [phone]

## 2021-06-02 ENCOUNTER — Encounter (HOSPITAL_BASED_OUTPATIENT_CLINIC_OR_DEPARTMENT_OTHER): Payer: BC Managed Care – PPO | Admitting: Orthopaedic Surgery

## 2021-06-02 ENCOUNTER — Ambulatory Visit (INDEPENDENT_AMBULATORY_CARE_PROVIDER_SITE_OTHER): Payer: BC Managed Care – PPO | Admitting: Orthopaedic Surgery

## 2021-06-02 DIAGNOSIS — S73191D Other sprain of right hip, subsequent encounter: Secondary | ICD-10-CM

## 2021-06-02 NOTE — Progress Notes (Signed)
Post Operative Evaluation    Procedure/Date of Surgery: Right hip arthroscopy with labral repair, pincer debridement 03/02/21  Interval History:   06/02/2021: Brianna [name] presents today 12 weeks status post right hip arthroscopy with labral pair.  Overall she has had to take a break from physical therapy for 3 weeks as result of needing a cholecystectomy.  She states that it is worsening did not improve her GI symptoms.  She is having soreness since she returns to therapy in the right hip.  PMH/PSH/Family History/Social History/Meds/Allergies:    Past Medical History:  Diagnosis Date   Acute recurrent pansinusitis 10/21/2019   ADHD (attention deficit hyperactivity disorder)    inattentive   Anemia    Anxiety    Asthma    Bronchitis    Cat allergy due to both airborne and skin contact 05/01/2016   Chronic sore throat 05/01/2016   COVID 2020   Depression    Fibromyalgia    Gastroparesis    GERD (gastroesophageal reflux disease)    Hypermobile Ehlers-Danlos syndrome    Irregular heart rate    Keratosis pilaris    Migraine    Seasonal allergic rhinitis 03/11/2014   Past Surgical History:  Procedure Laterality Date   ESOPHAGOGASTRODUODENOSCOPY ENDOSCOPY     03/2020   JOINT REPLACEMENT     LABRAL REPAIR Right 03/02/2021   Procedure: RIGHT HIP ARTHROSCOPY WITH LABRAL REPAIR AND PINCE DEBRIDEMENT;  Surgeon: [name], MD;  Location: [facility] OR;  Service: Orthopedics;  Laterality: Right;   SMART PILL PROCEDURE     12/2020   TYMPANOSTOMY TUBE PLACEMENT Bilateral    WISDOM TOOTH EXTRACTION     Social History   Socioeconomic History   Marital status: Single    Spouse name: Not on file   Number of children: Not on file   Years of education: Not on file   Highest education level: Not on file  Occupational History   Not on file  Tobacco Use   Smoking status: Former    Types: E-cigarettes, Cigarettes    Quit date: 2021    Years since  quitting: 2.0   Smokeless tobacco: Never  Vaping Use   Vaping Use: Former   Substances: Financial trader  Substance and Sexual Activity   Alcohol use: Not Currently   Drug use: Yes    Types: Marijuana    Comment: last use 7 days ago   Sexual activity: Yes    Birth control/protection: None  Other Topics Concern   Not on file  Social History Narrative   Right handed   Social Determinants of Health   Financial Resource Strain: Not on file  Food Insecurity: Not on file  Transportation Needs: Not on file  Physical Activity: Not on file  Stress: Not on file  Social Connections: Not on file   Family History  Problem Relation Age of Onset   Diabetes Mother    Polycystic ovary syndrome Mother    GER disease Father    Heart disease Paternal Uncle    Allergies  Allergen Reactions   Clindamycin Hives, Swelling and Rash   Current Outpatient Medications  Medication Sig Dispense Refill   traMADol (ULTRAM) 50 MG tablet Take 1 tablet (50 mg total) by mouth every 12 (twelve) hours as needed for severe pain. 30 tablet 0   Acetylcysteine (NAC)  600 MG CAPS Take 1 capsule (600 mg total) by mouth 2 (two) times daily. (Patient not taking: Reported on 03/08/2021) 60 capsule 0   albuterol (VENTOLIN HFA) 108 (90 Base) MCG/ACT inhaler Inhale 1 puff into the lungs every 6 (six) hours as needed for shortness of breath or wheezing. (Patient not taking: Reported on 03/08/2021)     aspirin EC 325 MG tablet Take 1 tablet (325 mg total) by mouth daily. 30 tablet 0   Cholecalciferol (VITAMIN D3 PO) Take 1 capsule by mouth every 3 (three) days.     dicyclomine (BENTYL) 10 MG capsule Take 10 mg by mouth 3 (three) times daily.     Ferrous Sulfate Dried (EQ SLOW-RELEASE IRON) 45 MG TBCR Take 1 tablet by mouth every 3 (three) days.     fluticasone (FLONASE) 50 MCG/ACT nasal spray SPRAY 1 SPRAY INTO BOTH NOSTRILS DAILY. (Patient taking differently: Place 1 spray into both nostrils 2 (two) times daily.) 16  mL 1   loratadine (CLARITIN) 10 MG tablet Take 1 tablet (10 mg total) by mouth daily. 90 tablet 1   Multiple Vitamins-Minerals (ADULT GUMMY PO) Take 2 capsules by mouth daily.     ondansetron (ZOFRAN ODT) 4 MG disintegrating tablet Take 1 tablet (4 mg total) by mouth every 8 (eight) hours as needed for nausea or vomiting. 20 tablet 5   OVER THE COUNTER MEDICATION Apply 1 application topically daily as needed (pain). CBD menthol muscle rub     pregabalin (LYRICA) 25 MG capsule Take 25 mg by mouth daily.     promethazine (PHENERGAN) 25 MG tablet Take 1 tablet (25 mg total) by mouth every 6 (six) hours as needed for nausea or vomiting. 30 tablet 0   Prucalopride Succinate 2 MG TABS Take 2 mg by mouth daily.     RABEprazole (ACIPHEX) 20 MG tablet Take 20 mg by mouth 2 (two) times daily.     scopolamine (TRANSDERM-SCOP, 1.5 MG,) 1 MG/3DAYS Place 1 patch (1.5 mg total) onto the skin every 3 (three) days. 4 patch 0   No current facility-administered medications for this visit.   No results found.  Review of Systems:   A ROS was performed including pertinent positives and negatives as documented in the HPI.   Musculoskeletal Exam:     Right hip incisions are well healed.  External rotation is to approximately 35 degrees versus 45 on the contralateral side.  Internal rotation is to 30 degrees with minimal pain.  There is some psoas tendon pain as well as greater troches pain  Imaging:    None  I personally reviewed and interpreted the radiographs.   Assessment:   26 year old female status post right hip arthroscopy.  She overall I do believe is having psoas tendinitis as well as greater trochanteric pain which I believe is a result of weakness as result of taking time from physical therapy.  Understandably so she was needing to undergo an additional surgery for her GI system.  At this point I would like her to get back into physical therapy to hopefully regain her glutes strength in order to  assist with this anterior overload.  I discussed the role for possible ultrasound-guided psoas injection at the next visit if needed. Plan :    -Return to clinic in 8 weeks   I personally saw and evaluated the patient, and participated in the management and treatment plan.  [name], MD Attending Physician, Orthopedic Surgery  This document was dictated using Dragon voice recognition  software. A reasonable attempt at proof reading has been made to minimize errors.

## 2021-06-03 DIAGNOSIS — I872 Venous insufficiency (chronic) (peripheral): Secondary | ICD-10-CM | POA: Diagnosis not present

## 2021-06-03 DIAGNOSIS — Z9049 Acquired absence of other specified parts of digestive tract: Secondary | ICD-10-CM | POA: Diagnosis not present

## 2021-06-03 DIAGNOSIS — I75023 Atheroembolism of bilateral lower extremities: Secondary | ICD-10-CM | POA: Insufficient documentation

## 2021-06-03 DIAGNOSIS — Z8616 Personal history of COVID-19: Secondary | ICD-10-CM | POA: Diagnosis not present

## 2021-06-03 DIAGNOSIS — K3184 Gastroparesis: Secondary | ICD-10-CM | POA: Diagnosis not present

## 2021-06-03 DIAGNOSIS — G909 Disorder of the autonomic nervous system, unspecified: Secondary | ICD-10-CM | POA: Diagnosis not present

## 2021-06-03 DIAGNOSIS — Q7962 Hypermobile Ehlers-Danlos syndrome: Secondary | ICD-10-CM | POA: Diagnosis not present

## 2021-06-03 DIAGNOSIS — M797 Fibromyalgia: Secondary | ICD-10-CM | POA: Diagnosis not present

## 2021-06-03 DIAGNOSIS — R42 Dizziness and giddiness: Secondary | ICD-10-CM | POA: Insufficient documentation

## 2021-06-03 DIAGNOSIS — G43909 Migraine, unspecified, not intractable, without status migrainosus: Secondary | ICD-10-CM | POA: Diagnosis not present

## 2021-06-03 DIAGNOSIS — R000 Tachycardia, unspecified: Secondary | ICD-10-CM | POA: Diagnosis not present

## 2021-06-03 DIAGNOSIS — K828 Other specified diseases of gallbladder: Secondary | ICD-10-CM | POA: Diagnosis not present

## 2021-06-05 ENCOUNTER — Other Ambulatory Visit: Payer: Self-pay | Admitting: Internal Medicine

## 2021-06-06 ENCOUNTER — Other Ambulatory Visit: Payer: Self-pay | Admitting: Internal Medicine

## 2021-06-06 DIAGNOSIS — F3341 Major depressive disorder, recurrent, in partial remission: Secondary | ICD-10-CM | POA: Diagnosis not present

## 2021-06-06 DIAGNOSIS — G894 Chronic pain syndrome: Secondary | ICD-10-CM | POA: Diagnosis not present

## 2021-06-06 DIAGNOSIS — F411 Generalized anxiety disorder: Secondary | ICD-10-CM | POA: Diagnosis not present

## 2021-06-09 DIAGNOSIS — J45990 Exercise induced bronchospasm: Secondary | ICD-10-CM | POA: Insufficient documentation

## 2021-06-10 ENCOUNTER — Ambulatory Visit: Payer: BC Managed Care – PPO | Admitting: Physical Therapy

## 2021-06-10 DIAGNOSIS — R590 Localized enlarged lymph nodes: Secondary | ICD-10-CM | POA: Insufficient documentation

## 2021-06-10 DIAGNOSIS — L299 Pruritus, unspecified: Secondary | ICD-10-CM | POA: Insufficient documentation

## 2021-06-10 DIAGNOSIS — J45990 Exercise induced bronchospasm: Secondary | ICD-10-CM | POA: Diagnosis not present

## 2021-06-10 DIAGNOSIS — J309 Allergic rhinitis, unspecified: Secondary | ICD-10-CM | POA: Diagnosis not present

## 2021-06-11 DIAGNOSIS — Z20822 Contact with and (suspected) exposure to covid-19: Secondary | ICD-10-CM | POA: Diagnosis not present

## 2021-06-11 DIAGNOSIS — U071 COVID-19: Secondary | ICD-10-CM | POA: Diagnosis not present

## 2021-06-16 DIAGNOSIS — J0100 Acute maxillary sinusitis, unspecified: Secondary | ICD-10-CM | POA: Diagnosis not present

## 2021-06-16 DIAGNOSIS — U071 COVID-19: Secondary | ICD-10-CM | POA: Diagnosis not present

## 2021-06-18 DIAGNOSIS — F411 Generalized anxiety disorder: Secondary | ICD-10-CM | POA: Diagnosis not present

## 2021-06-18 DIAGNOSIS — F3341 Major depressive disorder, recurrent, in partial remission: Secondary | ICD-10-CM | POA: Diagnosis not present

## 2021-06-18 DIAGNOSIS — G894 Chronic pain syndrome: Secondary | ICD-10-CM | POA: Diagnosis not present

## 2021-06-21 DIAGNOSIS — F331 Major depressive disorder, recurrent, moderate: Secondary | ICD-10-CM | POA: Diagnosis not present

## 2021-06-21 DIAGNOSIS — F411 Generalized anxiety disorder: Secondary | ICD-10-CM | POA: Diagnosis not present

## 2021-06-21 DIAGNOSIS — R030 Elevated blood-pressure reading, without diagnosis of hypertension: Secondary | ICD-10-CM | POA: Diagnosis not present

## 2021-06-21 DIAGNOSIS — F5089 Other specified eating disorder: Secondary | ICD-10-CM | POA: Diagnosis not present

## 2021-06-21 DIAGNOSIS — F439 Reaction to severe stress, unspecified: Secondary | ICD-10-CM | POA: Diagnosis not present

## 2021-06-21 DIAGNOSIS — R000 Tachycardia, unspecified: Secondary | ICD-10-CM | POA: Diagnosis not present

## 2021-06-23 ENCOUNTER — Encounter: Payer: Self-pay | Admitting: Obstetrics and Gynecology

## 2021-06-23 ENCOUNTER — Other Ambulatory Visit: Payer: Self-pay

## 2021-06-23 ENCOUNTER — Encounter: Payer: Self-pay | Admitting: Physical Therapy

## 2021-06-23 ENCOUNTER — Ambulatory Visit (INDEPENDENT_AMBULATORY_CARE_PROVIDER_SITE_OTHER): Payer: BC Managed Care – PPO | Admitting: Obstetrics and Gynecology

## 2021-06-23 ENCOUNTER — Ambulatory Visit (INDEPENDENT_AMBULATORY_CARE_PROVIDER_SITE_OTHER): Payer: BC Managed Care – PPO | Admitting: Family Medicine

## 2021-06-23 ENCOUNTER — Ambulatory Visit
Payer: BC Managed Care – PPO | Attending: Student in an Organized Health Care Education/Training Program | Admitting: Physical Therapy

## 2021-06-23 ENCOUNTER — Other Ambulatory Visit (HOSPITAL_COMMUNITY)
Admission: RE | Admit: 2021-06-23 | Discharge: 2021-06-23 | Disposition: A | Discharge status: Home / Self Care | Payer: BC Managed Care – PPO | Source: Ambulatory Visit | Attending: Obstetrics and Gynecology | Admitting: Obstetrics and Gynecology

## 2021-06-23 VITALS — BP 101/67 | HR 89 | Resp 16 | Ht 64.0 in | Wt 105.0 lb

## 2021-06-23 DIAGNOSIS — M24159 Other articular cartilage disorders, unspecified hip: Secondary | ICD-10-CM | POA: Insufficient documentation

## 2021-06-23 DIAGNOSIS — Z113 Encounter for screening for infections with a predominantly sexual mode of transmission: Secondary | ICD-10-CM | POA: Diagnosis not present

## 2021-06-23 DIAGNOSIS — R262 Difficulty in walking, not elsewhere classified: Secondary | ICD-10-CM | POA: Diagnosis not present

## 2021-06-23 DIAGNOSIS — M6281 Muscle weakness (generalized): Secondary | ICD-10-CM | POA: Diagnosis not present

## 2021-06-23 DIAGNOSIS — L650 Telogen effluvium: Secondary | ICD-10-CM

## 2021-06-23 DIAGNOSIS — Z713 Dietary counseling and surveillance: Secondary | ICD-10-CM | POA: Diagnosis not present

## 2021-06-23 DIAGNOSIS — M25551 Pain in right hip: Secondary | ICD-10-CM | POA: Insufficient documentation

## 2021-06-23 DIAGNOSIS — M7918 Myalgia, other site: Secondary | ICD-10-CM | POA: Diagnosis not present

## 2021-06-23 DIAGNOSIS — N898 Other specified noninflammatory disorders of vagina: Secondary | ICD-10-CM

## 2021-06-23 DIAGNOSIS — K3184 Gastroparesis: Secondary | ICD-10-CM

## 2021-06-23 DIAGNOSIS — M357 Hypermobility syndrome: Secondary | ICD-10-CM | POA: Diagnosis not present

## 2021-06-23 NOTE — Progress Notes (Signed)
GYNECOLOGY OFFICE VISIT NOTE  History:   Brianna [name] is a 26 y.o. G1P0010 here today for:   STI screening - last sexually active in December. She has no specific concerns but would prefer screening for all STI. Declines HBV screening as she had the vaccine as a baby.  Cycle variability - she has also had hair loss. 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 She has done nioxin but continues to have hair loss. She also notes cycle varibility ranging from 27 to longer cycles. Current cycle is 34d and no period yet. She has not been sexually active so no concern of pregnancy.  Vaginal discharge - has been for the last couple months a watery discharge that comes and goes, usually lasting for 2 weeks. It does not have an odor. She has remotely had BV in the past.     Past Medical History:  Diagnosis Date   Acute recurrent pansinusitis 10/21/2019   ADHD (attention deficit hyperactivity disorder)    inattentive   Anemia    Anxiety    Asthma    Bronchitis    Cat allergy due to both airborne and skin contact 05/01/2016   Chronic sore throat 05/01/2016   COVID 2020   Depression    Fibromyalgia    Gastroparesis    GERD (gastroesophageal reflux disease)    Hypermobile Ehlers-Danlos syndrome    Irregular heart rate    Keratosis pilaris    Migraine    Seasonal allergic rhinitis 03/11/2014    Past Surgical History:  Procedure Laterality Date   ESOPHAGOGASTRODUODENOSCOPY ENDOSCOPY     03/2020   JOINT REPLACEMENT     LABRAL REPAIR Right 03/02/2021   Procedure: RIGHT HIP ARTHROSCOPY WITH LABRAL REPAIR AND PINCE DEBRIDEMENT;  Surgeon: [name], MD;  Location: [facility] OR;  Service: Orthopedics;  Laterality: Right;   SMART PILL PROCEDURE     12/2020   TYMPANOSTOMY TUBE PLACEMENT Bilateral    WISDOM TOOTH EXTRACTION      The following portions of the patient's history were reviewed and updated as appropriate: allergies, current medications,  past family history, past medical history, past social history, past surgical history and problem list.   Health Maintenance:   Diagnosis  Date Value Ref Range Status  01/10/2021   Final   - Negative for intraepithelial lesion or malignancy (NILM)    Review of Systems:  Pertinent items noted in HPI and remainder of comprehensive ROS otherwise negative.  Physical Exam:  BP 101/67    Pulse 89    Resp 16    Ht 5\' 4"  (1.626 m)    Wt 105 lb (47.6 kg)    LMP 05/21/2021    BMI 18.02 kg/m  CONSTITUTIONAL: Well-developed, well-nourished female in no acute distress.  HEENT:  Normocephalic, atraumatic. External right and left ear normal. No scleral icterus.  NECK: Normal range of motion, supple, no masses noted on observation SKIN: No rash noted. Not diaphoretic. No erythema. No pallor. MUSCULOSKELETAL: Normal range of motion. No edema noted. NEUROLOGIC: Alert and oriented to person, place, and time. Normal muscle tone coordination. No cranial nerve deficit noted. PSYCHIATRIC: Normal mood and affect. Normal behavior. Normal judgment and thought content.  CARDIOVASCULAR: Normal heart rate noted RESPIRATORY: Effort and breath sounds normal, no problems with respiration noted ABDOMEN: No masses noted. No other overt distention noted.    PELVIC: Normal appearing external genitalia; normal urethral meatus; normal appearing vaginal mucosa and cervix.  No abnormal  discharge noted.  Normal uterine size, no other palpable masses, no uterine or adnexal tenderness. Performed in the presence of a chaperone  Labs and Imaging No results found for this or any previous visit (from the past 168 hour(s)). No results found.  Assessment and Plan:  Brianna [name] was seen today for sti testing.  Diagnoses and all orders for this visit:  Routine screening for STI (sexually transmitted infection) -     Cervicovaginal ancillary only( [place]) -     RPR -     HIV antibody (with reflex) -     Hepatitis C Antibody -      Hepatitis B Surface AntiGEN  Vaginal discharge -     Cervicovaginal ancillary only( [place])  Telogen hair loss - On review of chart, she previously had a borderline elevated cortisol (was 19.3 which is the upper limit of normal which is 19.4) - DHEA was also elevated - We will recheck and if elevated then I would recommend follow up with her endocrinologist through [facility].  -     DHEA -     Cortisol   Routine preventative health maintenance measures emphasized. Please refer to After Visit Summary for other counseling recommendations.   Return in about 7 months (around 01/21/2022) for annual.  Milas Hock, MD, FACOG Obstetrician & Gynecologist, [facility] for [facility], [facility]

## 2021-06-23 NOTE — Patient Instructions (Addendum)
-   If you decide to experiment with flax seed meal, start with no more than 1/4 to 1/2 teaspoon, and document any response.  Do not increase dose more often than once a week.    - Experiment with putting small amounts of fruits and/or vegetables in your smoothies.    - Continue with small, frequent meals and snacks low in fat.    - Continue to aim for ~60 oz of water per day.    - Call your surgeon and/or pharmacist to ask if there is any process through which you can get a rx for cholestyramine at lower cost.    - Ask your gastroenterologist:   - If he has a GI dietitian to whom he refers.   - About slowly and progressively adding soluble fiber to your diet - in supplemental form, e.g., flax seed meal or commercial product like psyllium.    - Please keep me posted with your progress, and call for an appt as needed.
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
## 2021-06-23 NOTE — Therapy (Addendum)
[place], [place], [address] Phone: [phone]   Fax:  [phone]  Physical Therapy Treatment  Patient Details  Name: Brianna [name] MRN: [id number] Date of Birth: [birth year] Referring Provider (PT): Dr. Vanetta Mulders   Encounter Date: 06/23/2021   PT End of Session - 06/23/21 1250     Visit Number 20    Number of Visits 24    Date for PT Re-Evaluation 07/07/21    Authorization Type BCBS    PT Start Time 1235    PT Stop Time 1320    PT Time Calculation (min) 45 min    Activity Tolerance Patient tolerated treatment well    Behavior During Therapy [facility] for tasks assessed/performed             Past Medical History:  Diagnosis Date   Acute recurrent pansinusitis 10/21/2019   ADHD (attention deficit hyperactivity disorder)    inattentive   Anemia    Anxiety    Asthma    Bronchitis    Cat allergy due to both airborne and skin contact 05/01/2016   Chronic sore throat 05/01/2016   COVID 2020   Depression    Fibromyalgia    Gastroparesis    GERD (gastroesophageal reflux disease)    Hypermobile Ehlers-Danlos syndrome    Irregular heart rate    Keratosis pilaris    Migraine    Seasonal allergic rhinitis 03/11/2014    Past Surgical History:  Procedure Laterality Date   ESOPHAGOGASTRODUODENOSCOPY ENDOSCOPY     03/2020   JOINT REPLACEMENT     LABRAL REPAIR Right 03/02/2021   Procedure: RIGHT HIP ARTHROSCOPY WITH LABRAL REPAIR AND PINCE DEBRIDEMENT;  Surgeon: Vanetta Mulders, MD;  Location: [place];  Service: Orthopedics;  Laterality: Right;   SMART PILL PROCEDURE     12/2020   TYMPANOSTOMY TUBE PLACEMENT Bilateral    WISDOM TOOTH EXTRACTION      There were no vitals filed for this visit.   Subjective Assessment - 06/23/21 1237     Subjective Is very busy, got COVID on the first day of work.  Migraines were really bad.  HIp is sore.  If im still feeling bad I can get a psoas  injection but that is scary.    Currently in Pain? Yes    Pain Score 2     Pain Location Hip    Pain Orientation Right    Pain Descriptors / Indicators Aching;Sore    Pain Type Surgical pain    Pain Onset More than a month ago    Pain Frequency Intermittent    Aggravating Factors  work, standing    Pain Relieving Factors rest, ice    Effect of Pain on Daily Activities work is alot                 [facility] Adult PT Treatment:                                                DATE: 06/23/21 Therapeutic Exercise: Wall sit for 1 min with 10 lbs  Wall slides for 15 reps , 15 lbs  Washcloth/sliders lunges for 10 each side with 10 lbs  Lateral band walking with 10  lbs then blue band around feet  Clam on elbow x 10 each side  Sideplank with clam x 10 , multiple  sets and variation  Hamstring bridge x 10 Hamstring bridge with small ROM march x 10 each LE  Anterior hip stretching Rt anterior hip   Self Care: HEP , form with exercises, PNE /chronic pain    Access Code: [email or web address] URL: [email or web address] Date: 06/23/2021 Prepared by: Raeford Razor  Exercises Reverse Lunge on Slider - 1 x daily - 7 x weekly - 2 sets - 10 reps      PT Education - 06/23/21 1439     Education Details loading the hip without weights    Person(s) Educated Patient    Methods Explanation;Demonstration;Verbal cues    Comprehension Verbalized understanding              PT Short Term Goals - 05/05/21 1213       PT SHORT TERM GOAL #1   Title Pt will be I with initial HEP for Rt hip ROM, strength in Rt LE    Status Achieved      PT SHORT TERM GOAL #2   Title Pt will be able to complete FOTO and understand results    Status Deferred      PT SHORT TERM GOAL #3   Title Pt will be able to walk without crutches in her home and min increase in pain    Status Achieved      PT SHORT TERM GOAL #4   Title Pt will be able to return to her apartment if tolerating gait and stairs with or  without crutches.    Baseline uses Rt rail , alternating most of the time    Status Achieved               PT Long Term Goals - 06/23/21 1439       PT LONG TERM GOAL #1   Title Patient is independent with advanced HEP    Status On-going      PT LONG TERM GOAL #2   Title Pt will be able to score FOTO TBA    Status Deferred      PT LONG TERM GOAL #3   Title Pt will be able to lead with her Rt LE when walking up stairs in her home 50% of the time with pain minimal    Status Achieved      PT LONG TERM GOAL #4   Title Pt will be able to walk with full stride and no increase in pain for 1000 feet with no device    Status Achieved      PT LONG TERM GOAL #5   Title Pt will be able to demo strength in Rt hip to 4/5 in all planes    Status Unable to assess      PT LONG TERM GOAL #6   Title Pt will be able to sit on the floor cross legged and get up without difficulty    Status Unable to assess      PT LONG TERM GOAL #7   Title Pt will be able to participate in community yoga class with modifications    Status Unable to assess                   Plan - 06/23/21 1440     Clinical Impression Statement Patient has not been to PT in several weeks, with new job, illness, full time school  and multiple apts.  She continues to walk about 8000 steps per day. She has soreness in her hip that is fairly constant.  She has good strength and form with HEP.  Able to work all areas of the hip today without increased pain .  She has 2 more appts and may or may not be able to complete them due to work schedule.  She will follow up to let usknow if she is unable to attend.    PT Treatment/Interventions ADLs/Self Care Home Management;Biofeedback;Cryotherapy;Electrical Stimulation;Iontophoresis 4mg /ml Dexamethasone;Moist Heat;Ultrasound;Neuromuscular re-education;Therapeutic exercise;Therapeutic activities;Patient/family education;Manual techniques;Dry needling;Taping;Spinal  Manipulations;Joint Manipulations;Aquatic Therapy;Passive range of motion    PT Next Visit Plan closed chain strength as tolerated, step ups, etc manual Rt thigh if needed    PT Home Exercise Plan Access Code: [email or web address]    Consulted and Agree with Plan of Care Patient             Patient will benefit from skilled therapeutic intervention in order to improve the following deficits and impairments:  Decreased coordination, Increased fascial restricitons, Decreased endurance, Decreased activity tolerance, Dizziness, Pain, Decreased strength, Decreased mobility, Decreased range of motion, Hypermobility  Visit Diagnosis: Pain in right hip  Difficulty in walking, not elsewhere classified  Muscle weakness (generalized)  Hypermobility syndrome  Labral tear of hip, degenerative  Myofascial muscle pain     Problem List Patient Active Problem List   Diagnosis Date Noted   Cervical adenopathy 06/10/2021   Chronic pruritus 06/10/2021   Exercise-induced asthma 06/09/2021   Chronic venous insufficiency 06/03/2021   Orthostatic lightheadedness 06/03/2021   Purple toe syndrome of both feet ([place]) 06/03/2021   Autonomic dysfunction 06/03/2021   Labral tear of hip, degenerative 04/13/2021   Migraine headache 03/01/2021   Headache 11/30/2020   Positive ANA (antinuclear antibody) 11/15/2020   Fibromyalgia 11/02/2020   Hair loss 11/02/2020   Muscle spasticity 10/22/2020   Hypermobile Ehlers-Danlos syndrome 10/22/2020   Paresthesia of bilateral legs 10/06/2020   Myalgia 10/06/2020   Chronic pelvic pain in female 10/06/2020   Chronic abdominal pain 10/06/2020   Irritable colon 08/02/2020   Pelvic floor dysfunction 08/02/2020   Irritant dermatitis 08/02/2020   Gastroesophageal reflux disease 06/28/2020   Severe recurrent major depression without psychotic features ([place]) 06/28/2020   Polyarthralgia 06/28/2020   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 06/28/2020    Tachycardia 06/28/2020   History of COVID-19 06/28/2020   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 deficit 06/28/2020   Recurrent major depressive disorder, in full remission ([place]) 06/28/2020   Atypical chest pain 06/22/2020   Dizziness 06/22/2020   Palpitations 06/14/2020   Anxiety 05/04/2020   Internal and external hemorrhoids without complication [id number]   Nondiabetic gastroparesis 03/03/2020   ETD (Eustachian tube dysfunction), bilateral 10/21/2019   Acne vulgaris 03/11/2014   Mild intermittent asthma without complication [id number]   Seasonal allergies 03/11/2014    Layney Gillson, PT 06/23/2021, 2:43 PM  [place] [facility] [address] [place], [place], [address] Phone: [phone]   Fax:  [phone]  Name: Brianna [name] MRN: [id number] Date of Birth: 06/12/1995  Raeford Razor, PT 06/23/21 2:43 PM Phone: [phone] Fax: [phone]

## 2021-06-24 LAB — CERVICOVAGINAL ANCILLARY ONLY
Chlamydia: NEGATIVE
Comment: NEGATIVE
Comment: NEGATIVE
Comment: NORMAL
Neisseria Gonorrhea: NEGATIVE
Trichomonas: NEGATIVE

## 2021-06-25 DIAGNOSIS — R590 Localized enlarged lymph nodes: Secondary | ICD-10-CM | POA: Diagnosis not present

## 2021-06-28 DIAGNOSIS — Z113 Encounter for screening for infections with a predominantly sexual mode of transmission: Secondary | ICD-10-CM | POA: Diagnosis not present

## 2021-06-28 DIAGNOSIS — L650 Telogen effluvium: Secondary | ICD-10-CM | POA: Diagnosis not present

## 2021-06-29 LAB — HEPATITIS B SURFACE ANTIGEN: Hepatitis B Surface Ag: NEGATIVE

## 2021-07-01 LAB — DHEA: Dehydroepiandrosterone (DHEA): 892 ng/dL — ABNORMAL HIGH (ref 31–701)

## 2021-07-01 LAB — HEPATITIS C ANTIBODY: Hep C Virus Ab: <0.1 s/co ratio (ref 0.0–0.9)

## 2021-07-01 LAB — RPR: RPR Ser Ql: NONREACTIVE

## 2021-07-01 LAB — HIV ANTIBODY (ROUTINE TESTING W REFLEX): HIV Screen 4th Generation wRfx: NONREACTIVE

## 2021-07-01 LAB — CORTISOL: Cortisol: 19.2 ug/dL

## 2021-07-02 DIAGNOSIS — F3341 Major depressive disorder, recurrent, in partial remission: Secondary | ICD-10-CM | POA: Diagnosis not present

## 2021-07-02 DIAGNOSIS — F411 Generalized anxiety disorder: Secondary | ICD-10-CM | POA: Diagnosis not present

## 2021-07-02 DIAGNOSIS — G894 Chronic pain syndrome: Secondary | ICD-10-CM | POA: Diagnosis not present

## 2021-07-06 ENCOUNTER — Encounter: Payer: Self-pay | Admitting: *Deleted

## 2021-07-09 DIAGNOSIS — F411 Generalized anxiety disorder: Secondary | ICD-10-CM | POA: Diagnosis not present

## 2021-07-09 DIAGNOSIS — F3341 Major depressive disorder, recurrent, in partial remission: Secondary | ICD-10-CM | POA: Diagnosis not present

## 2021-07-09 DIAGNOSIS — G894 Chronic pain syndrome: Secondary | ICD-10-CM | POA: Diagnosis not present

## 2021-07-12 ENCOUNTER — Telehealth: Payer: Self-pay | Admitting: Obstetrics and Gynecology

## 2021-07-12 DIAGNOSIS — L650 Telogen effluvium: Secondary | ICD-10-CM | POA: Diagnosis not present

## 2021-07-12 DIAGNOSIS — L218 Other seborrheic dermatitis: Secondary | ICD-10-CM | POA: Diagnosis not present

## 2021-07-12 NOTE — Telephone Encounter (Signed)
Reviewed normal but high cortisol (19.2, stable from 19.3), DHEA - 897, previously 817. Recommended she follow up with endocrinology. Answered all questions.

## 2021-07-13 ENCOUNTER — Other Ambulatory Visit: Payer: Self-pay

## 2021-07-13 ENCOUNTER — Encounter: Payer: Self-pay | Admitting: *Deleted

## 2021-07-13 ENCOUNTER — Ambulatory Visit: Payer: BC Managed Care – PPO | Admitting: Physical Therapy

## 2021-07-13 ENCOUNTER — Encounter: Payer: Self-pay | Admitting: Physical Therapy

## 2021-07-13 DIAGNOSIS — M357 Hypermobility syndrome: Secondary | ICD-10-CM

## 2021-07-13 DIAGNOSIS — K9049 Malabsorption due to intolerance, not elsewhere classified: Secondary | ICD-10-CM | POA: Diagnosis not present

## 2021-07-13 DIAGNOSIS — M6281 Muscle weakness (generalized): Secondary | ICD-10-CM | POA: Diagnosis not present

## 2021-07-13 DIAGNOSIS — M24159 Other articular cartilage disorders, unspecified hip: Secondary | ICD-10-CM

## 2021-07-13 DIAGNOSIS — R262 Difficulty in walking, not elsewhere classified: Secondary | ICD-10-CM

## 2021-07-13 DIAGNOSIS — K3184 Gastroparesis: Secondary | ICD-10-CM | POA: Diagnosis not present

## 2021-07-13 DIAGNOSIS — R140 Abdominal distension (gaseous): Secondary | ICD-10-CM | POA: Diagnosis not present

## 2021-07-13 DIAGNOSIS — M25551 Pain in right hip: Secondary | ICD-10-CM

## 2021-07-13 DIAGNOSIS — M7918 Myalgia, other site: Secondary | ICD-10-CM | POA: Diagnosis not present

## 2021-07-13 NOTE — Therapy (Signed)
[place], [place], [address] Phone: [phone]   Fax:  [phone]  Physical Therapy Treatment  Patient Details  Name: Brianna [name] MRN: [id number] Date of Birth: 1995-11-24 Referring Provider (PT): Dr. Vanetta Mulders   Encounter Date: 07/13/2021   PT End of Session - 07/13/21 1648     Visit Number 21    Number of Visits 29    Date for PT Re-Evaluation 09/07/21    Authorization Type BCBS    PT Start Time [id number]    PT Stop Time [id number]    PT Time Calculation (min) 45 min    Activity Tolerance Patient tolerated treatment well    Behavior During Therapy [facility] for tasks assessed/performed             Past Medical History:  Diagnosis Date   Acute recurrent pansinusitis 10/21/2019   ADHD (attention deficit hyperactivity disorder)    inattentive   Anemia    Anxiety    Asthma    Bronchitis    Cat allergy due to both airborne and skin contact 05/01/2016   Chronic sore throat 05/01/2016   COVID 2020   Depression    Fibromyalgia    Gastroparesis    GERD (gastroesophageal reflux disease)    Hypermobile Ehlers-Danlos syndrome    Irregular heart rate    Keratosis pilaris    Migraine    Seasonal allergic rhinitis 03/11/2014    Past Surgical History:  Procedure Laterality Date   ESOPHAGOGASTRODUODENOSCOPY ENDOSCOPY     03/2020   JOINT REPLACEMENT     LABRAL REPAIR Right 03/02/2021   Procedure: RIGHT HIP ARTHROSCOPY WITH LABRAL REPAIR AND PINCE DEBRIDEMENT;  Surgeon: Vanetta Mulders, MD;  Location: [place];  Service: Orthopedics;  Laterality: Right;   SMART PILL PROCEDURE     12/2020   TYMPANOSTOMY TUBE PLACEMENT Bilateral    WISDOM TOOTH EXTRACTION      There were no vitals filed for this visit.   Subjective Assessment - 07/13/21 1424     Subjective Hip is sore after working full days.  Triggers are unpredictable (not always activity).  The hip feels weak.  Doing exercises about every 3  days.  My cortisol and DHEA were higher than they should be (PCOS?)    Currently in Pain? Yes    Pain Score 4     Pain Location Hip    Pain Orientation Right    Pain Descriptors / Indicators Aching;Sore    Pain Type Chronic pain    Pain Onset More than a month ago    Pain Frequency Intermittent    Aggravating Factors  varies    Pain Relieving Factors rest, ice                   [facility] Adult PT Treatment:                                                DATE: 07/13/21 Therapeutic Exercise: Elliptical 5 min Level 1 ramp, level 1 resistance Leg press both machines, preferred Cybex 3 plates double leg, 1 plate single leg Knee extension 15 lbs 2 x 10  Sidelying hip abduction with flex/ext (sidekick)  PROM Rt hip , MMT     PT Short Term Goals - 07/13/21 1649       PT SHORT TERM GOAL #1  Title Pt will be I with initial HEP for Rt hip ROM, strength in Rt LE    Status Achieved      PT SHORT TERM GOAL #2   Title Pt will be able to complete FOTO and understand results    Status Deferred      PT SHORT TERM GOAL #3   Title Pt will be able to walk without crutches in her home and min increase in pain    Status Achieved      PT SHORT TERM GOAL #4   Title Pt will be able to return to her apartment if tolerating gait and stairs with or without crutches.    Status Achieved               PT Long Term Goals - 07/13/21 1445       PT LONG TERM GOAL #1   Title Patient is independent with advanced HEP    Status On-going    Target Date 09/07/21      PT LONG TERM GOAL #2   Title Pt will be able to score FOTO TBA    Baseline not done    Time 8    Period Weeks    Status Deferred    Target Date 09/07/21      PT LONG TERM GOAL #3   Title Pt will be able to lead with her Rt LE when walking up stairs in her home 50% of the time with pain minimal    Status Achieved      PT LONG TERM GOAL #4   Title Pt will be able to walk with full stride and no increase in pain for 1000  feet with no device    Status Achieved      PT LONG TERM GOAL #5   Title Pt will be able to demo strength in Rt hip to 4/5 in all planes    Baseline 4+/5    Time 8    Period Weeks    Status Achieved    Target Date 09/07/21      PT LONG TERM GOAL #6   Title Pt will be able to sit on the floor cross legged and get up without difficulty    Baseline tight in Rt hip 40 deg , painful    Time 8    Period Weeks    Status On-going    Target Date 09/07/21      PT LONG TERM GOAL #7   Title Pt will be able to participate in community yoga class with modifications    Baseline not done    Time 8    Period Weeks    Status On-going    Target Date 09/07/21      PT LONG TERM GOAL #8   Title Pt will work a full day with pain no more than 2/10 most of the time    Baseline 4/10 or more    Time 8    Period Weeks    Status New    Target Date 09/07/21                   Plan - 07/13/21 1523     Clinical Impression Statement Patient has not been here in 3 weeks.  She is dealing with many things at the present time: school, new job, health issues.  Her hip is basically as it has been since last visit.  Rt hip is tight in ROM (external rot and  internal rot).  Strength when tested is decent, about 4+/5.  She needs to either commit to at least 3 days per week HEP and 1 x per week her in the clinic in [place] to see a benefit.  At this point she will schedule AFTER work or see if she can get out of work early to make her appt.  She is reluctant to see another PT but she needs to in order to meet this frequency (I don't work late afternoons).  Will renew her at this point for 8 more weeks with this modified scheduler.  She does need more Rt hip stability and quad strength.             Patient will benefit from skilled therapeutic intervention in order to improve the following deficits and impairments:     Visit Diagnosis: Pain in right hip  Difficulty in walking, not elsewhere  classified  Muscle weakness (generalized)  Hypermobility syndrome  Labral tear of hip, degenerative     Problem List Patient Active Problem List   Diagnosis Date Noted   Cervical adenopathy 06/10/2021   Chronic pruritus 06/10/2021   Exercise-induced asthma 06/09/2021   Chronic venous insufficiency 06/03/2021   Orthostatic lightheadedness 06/03/2021   Purple toe syndrome of both feet ([place]) 06/03/2021   Autonomic dysfunction 06/03/2021   Labral tear of hip, degenerative 04/13/2021   Migraine headache 03/01/2021   Headache 11/30/2020   Positive ANA (antinuclear antibody) 11/15/2020   Fibromyalgia 11/02/2020   Hair loss 11/02/2020   Muscle spasticity 10/22/2020   Hypermobile Ehlers-Danlos syndrome 10/22/2020   Paresthesia of bilateral legs 10/06/2020   Myalgia 10/06/2020   Chronic pelvic pain in female 10/06/2020   Chronic abdominal pain 10/06/2020   Irritable colon 08/02/2020   Pelvic floor dysfunction 08/02/2020   Irritant dermatitis 08/02/2020   Gastroesophageal reflux disease 06/28/2020   Severe recurrent major depression without psychotic features ([place]) 06/28/2020   Polyarthralgia 06/28/2020   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 06/28/2020   Tachycardia 06/28/2020   History of COVID-19 06/28/2020   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 deficit 06/28/2020   Recurrent major depressive disorder, in full remission ([place]) 06/28/2020   Atypical chest pain 06/22/2020   Dizziness 06/22/2020   Palpitations 06/14/2020   Anxiety 05/04/2020   Internal and external hemorrhoids without complication 123XX123   Nondiabetic gastroparesis 03/03/2020   ETD (Eustachian tube dysfunction), bilateral 10/21/2019   Acne vulgaris 03/11/2014   Mild intermittent asthma without complication [id number]   Seasonal allergies 03/11/2014    Adrienna Karis, PT 07/13/2021, 4:51 PM  [facility] [facility] [address] [place], [place], [address] Phone: [phone]   Fax:  [phone]  Name: Brianna [name] MRN: [id number] Date of Birth: Apr 28, 1996  Raeford Razor, PT 07/13/21 4:51 PM Phone: [phone] Fax: [phone]

## 2021-07-17 ENCOUNTER — Encounter: Payer: Self-pay | Admitting: Obstetrics and Gynecology

## 2021-07-18 DIAGNOSIS — R42 Dizziness and giddiness: Secondary | ICD-10-CM | POA: Diagnosis not present

## 2021-07-18 DIAGNOSIS — G909 Disorder of the autonomic nervous system, unspecified: Secondary | ICD-10-CM | POA: Diagnosis not present

## 2021-07-18 DIAGNOSIS — Q796 Ehlers-Danlos syndrome, unspecified: Secondary | ICD-10-CM | POA: Diagnosis not present

## 2021-07-18 DIAGNOSIS — I872 Venous insufficiency (chronic) (peripheral): Secondary | ICD-10-CM | POA: Diagnosis not present

## 2021-07-19 DIAGNOSIS — F411 Generalized anxiety disorder: Secondary | ICD-10-CM | POA: Diagnosis not present

## 2021-07-19 DIAGNOSIS — F3341 Major depressive disorder, recurrent, in partial remission: Secondary | ICD-10-CM | POA: Diagnosis not present

## 2021-07-20 ENCOUNTER — Ambulatory Visit (HOSPITAL_BASED_OUTPATIENT_CLINIC_OR_DEPARTMENT_OTHER): Payer: BC Managed Care – PPO | Admitting: Orthopaedic Surgery

## 2021-07-20 ENCOUNTER — Ambulatory Visit: Payer: BC Managed Care – PPO | Admitting: Physical Therapy

## 2021-07-20 DIAGNOSIS — H538 Other visual disturbances: Secondary | ICD-10-CM | POA: Diagnosis not present

## 2021-07-20 DIAGNOSIS — G43109 Migraine with aura, not intractable, without status migrainosus: Secondary | ICD-10-CM | POA: Diagnosis not present

## 2021-07-20 DIAGNOSIS — G43709 Chronic migraine without aura, not intractable, without status migrainosus: Secondary | ICD-10-CM | POA: Diagnosis not present

## 2021-07-20 DIAGNOSIS — I7300 Raynaud's syndrome without gangrene: Secondary | ICD-10-CM | POA: Diagnosis not present

## 2021-07-21 ENCOUNTER — Ambulatory Visit (INDEPENDENT_AMBULATORY_CARE_PROVIDER_SITE_OTHER): Payer: BC Managed Care – PPO | Admitting: Orthopaedic Surgery

## 2021-07-21 ENCOUNTER — Other Ambulatory Visit: Payer: Self-pay

## 2021-07-21 DIAGNOSIS — S73191D Other sprain of right hip, subsequent encounter: Secondary | ICD-10-CM

## 2021-07-21 NOTE — Progress Notes (Signed)
? ?                            ? ? ?Post Operative Evaluation ?  ? ?Procedure/Date of Surgery: Right hip arthroscopy with labral repair, pincer debridement 03/02/21 ? ?Interval History:  ? ?07/21/2021: Brianna [name] presents today for follow-up for the above procedure. She is able to make through the majority of her day at work with minimal pain although will have soreness by the time she gets home. She uses heat which is helpful. She has been unable to participate significantly in PT as a result of her new job. She is working on her chronic pain in terms of pain retraining therapy. ? ?PMH/PSH/Family History/Social History/Meds/Allergies:   ? ?Past Medical History:  ?Diagnosis Date  ? Acute recurrent pansinusitis 10/21/2019  ? ADHD (attention deficit hyperactivity disorder)   ? inattentive  ? Anemia   ? Anxiety   ? Asthma   ? Bronchitis   ? Cat allergy due to both airborne and skin contact 05/01/2016  ? Chronic sore throat 05/01/2016  ? COVID 2020  ? Depression   ? Fibromyalgia   ? Gastroparesis   ? GERD (gastroesophageal reflux disease)   ? Hypermobile Ehlers-Danlos syndrome   ? Irregular heart rate   ? Keratosis pilaris   ? Migraine   ? Seasonal allergic rhinitis 03/11/2014  ? ?Past Surgical History:  ?Procedure Laterality Date  ? ESOPHAGOGASTRODUODENOSCOPY ENDOSCOPY    ? 03/2020  ? JOINT REPLACEMENT    ? LABRAL REPAIR Right 03/02/2021  ? Procedure: RIGHT HIP ARTHROSCOPY WITH LABRAL REPAIR AND PINCE DEBRIDEMENT;  Surgeon: Vanetta Mulders, MD;  Location: [facility];  Service: Orthopedics;  Laterality: Right;  ? SMART PILL PROCEDURE    ? 12/2020  ? TYMPANOSTOMY TUBE PLACEMENT Bilateral   ? WISDOM TOOTH EXTRACTION    ? ?Social History  ? ?Socioeconomic History  ? Marital status: Single  ?  Spouse name: Not on file  ? Number of children: Not on file  ? Years of education: Not on file  ? Highest education level: Not on file  ?Occupational History  ? Not on file  ?Tobacco Use  ? Smoking status: Former  ?  Types: E-cigarettes,  Cigarettes  ?  Quit date: 2021  ?  Years since quitting: 2.1  ? Smokeless tobacco: Never  ?Vaping Use  ? Vaping Use: Former  ? Substances: Synthetic cannabinoids  ?Substance and Sexual Activity  ? Alcohol use: Not Currently  ? Drug use: Yes  ?  Types: Marijuana  ?  Comment: last use 7 days ago  ? Sexual activity: Yes  ?  Birth control/protection: None  ?Other Topics Concern  ? Not on file  ?Social History Narrative  ? Right handed  ? ?Social Determinants of Health  ? ?Financial Resource Strain: Not on file  ?Food Insecurity: Not on file  ?Transportation Needs: Not on file  ?Physical Activity: Not on file  ?Stress: Not on file  ?Social Connections: Not on file  ? ?Family History  ?Problem Relation Age of Onset  ? Diabetes Mother   ? Polycystic ovary syndrome Mother   ? GER disease Father   ? Heart disease Paternal Uncle   ? ?Allergies  ?Allergen Reactions  ? Clindamycin Hives, Swelling and Rash  ? ?Current Outpatient Medications  ?Medication Sig Dispense Refill  ? Cholecalciferol (VITAMIN D3 PO) Take 1 capsule by mouth every 3 (three) days. (Patient not taking: Reported on 06/23/2021)    ?  Cholecalciferol 25 MCG (1000 UT) tablet Take by mouth. (Patient not taking: Reported on 06/23/2021)    ? cholestyramine light (PREVALITE) 4 GM/DOSE powder Take by mouth. (Patient not taking: Reported on 06/23/2021)    ? escitalopram (LEXAPRO) 10 MG tablet Take 10 mg by mouth daily. (Patient not taking: Reported on 06/23/2021)    ? Ferrous Sulfate Dried (EQ SLOW-RELEASE IRON) 45 MG TBCR Take 1 tablet by mouth every 3 (three) days.    ? fluticasone (FLONASE) 50 MCG/ACT nasal spray SPRAY 1 SPRAY INTO BOTH NOSTRILS DAILY. 16 mL 1  ? ipratropium (ATROVENT) 0.03 % nasal spray Place into both nostrils.    ? loratadine (CLARITIN) 10 MG tablet Take 1 tablet (10 mg total) by mouth daily. 90 tablet 1  ? meloxicam (MOBIC) 15 MG tablet Take by mouth. (Patient not taking: Reported on 06/23/2021)    ? Multiple Vitamins-Minerals (ADULT GUMMY PO) Take 2  capsules by mouth daily.    ? norgestimate-ethinyl estradiol (ORTHO-CYCLEN) 0.25-35 MG-MCG tablet Take by mouth. (Patient not taking: Reported on 06/23/2021)    ? Omega-3 Fatty Acids (FISH OIL PO) Take 700 Units by mouth.    ? ondansetron (ZOFRAN ODT) 4 MG disintegrating tablet Take 1 tablet (4 mg total) by mouth every 8 (eight) hours as needed for nausea or vomiting. (Patient not taking: Reported on 06/23/2021) 20 tablet 5  ? predniSONE (DELTASONE) 50 MG tablet Take 50 mg by mouth daily. (Patient not taking: Reported on 06/23/2021)    ? promethazine (PHENERGAN) 25 MG tablet Take 1 tablet (25 mg total) by mouth every 6 (six) hours as needed for nausea or vomiting. 30 tablet 0  ? Prucalopride Succinate 2 MG TABS Take 2 mg by mouth daily.    ? RABEprazole (ACIPHEX) 20 MG tablet Take 20 mg by mouth 2 (two) times daily. (Patient not taking: Reported on 06/23/2021)    ? scopolamine (TRANSDERM-SCOP, 1.5 MG,) 1 MG/3DAYS Place 1 patch (1.5 mg total) onto the skin every 3 (three) days. (Patient not taking: Reported on 06/23/2021) 4 patch 0  ? spironolactone (ALDACTONE) 50 MG tablet Take 1 tablet by mouth daily.    ? ?No current facility-administered medications for this visit.  ? ?No results found. ? ?Review of Systems:   ?A ROS was performed including pertinent positives and negatives as documented in the HPI. ? ? ?Musculoskeletal Exam:   ? ? ?Right hip incisions are well healed.  External rotation is to approximately 35 degrees versus 45 on the contralateral side.  Internal rotation is to 30 degrees with minimal pain.  There is some psoas tendon pain as well as greater troches pain ? ?Imaging:   ? ?None ? ?I personally reviewed and interpreted the radiographs. ? ? ?Assessment:   ?26 year old female status post right hip arthroscopy.  She continues to make steady improvement. I have advised on a home therapy program which she will work through. I will see her back in 3 months. ?Plan :   ? ?-Return to clinic in 3 months ? ? ?I  personally saw and evaluated the patient, and participated in the management and treatment plan. ? ?Vanetta Mulders, MD ?Attending Physician, Orthopedic Surgery ? ?This document was dictated using Systems analyst. A reasonable attempt at proof reading has been made to minimize errors. ? ?

## 2021-07-27 ENCOUNTER — Encounter: Payer: Self-pay | Admitting: Physical Therapy

## 2021-07-27 ENCOUNTER — Ambulatory Visit
Payer: BC Managed Care – PPO | Attending: Student in an Organized Health Care Education/Training Program | Admitting: Physical Therapy

## 2021-07-27 ENCOUNTER — Other Ambulatory Visit: Payer: Self-pay

## 2021-07-27 ENCOUNTER — Ambulatory Visit: Payer: BC Managed Care – PPO

## 2021-07-27 DIAGNOSIS — R300 Dysuria: Secondary | ICD-10-CM | POA: Diagnosis not present

## 2021-07-27 DIAGNOSIS — M357 Hypermobility syndrome: Secondary | ICD-10-CM | POA: Insufficient documentation

## 2021-07-27 DIAGNOSIS — N898 Other specified noninflammatory disorders of vagina: Secondary | ICD-10-CM | POA: Diagnosis not present

## 2021-07-27 DIAGNOSIS — M25551 Pain in right hip: Secondary | ICD-10-CM | POA: Diagnosis not present

## 2021-07-27 DIAGNOSIS — M24159 Other articular cartilage disorders, unspecified hip: Secondary | ICD-10-CM | POA: Insufficient documentation

## 2021-07-27 DIAGNOSIS — R262 Difficulty in walking, not elsewhere classified: Secondary | ICD-10-CM | POA: Diagnosis not present

## 2021-07-27 DIAGNOSIS — M6281 Muscle weakness (generalized): Secondary | ICD-10-CM | POA: Diagnosis not present

## 2021-07-27 NOTE — Therapy (Signed)
[facility] Outpatient Rehabilitation [facility] [address] [place], [place], [address] Phone: [phone]   Fax:  [phone]  Physical Therapy Treatment  Patient Details  Name: Brianna [name] MRN: [id number] Date of Birth: 1996/05/16 Referring Provider (PT): Dr. [name]   Encounter Date: 07/27/2021   PT End of Session - 07/27/21 1602     Visit Number 22    Number of Visits 29    Date for PT Re-Evaluation 09/07/21    Authorization Type BCBS    PT Start Time 1600   15 min late   PT Stop Time 1638    PT Time Calculation (min) 38 min    Activity Tolerance Patient tolerated treatment well    Behavior During Therapy [facility] for tasks assessed/performed             Past Medical History:  Diagnosis Date   Acute recurrent pansinusitis 10/21/2019   ADHD (attention deficit hyperactivity disorder)    inattentive   Anemia    Anxiety    Asthma    Bronchitis    Cat allergy due to both airborne and skin contact 05/01/2016   Chronic sore throat 05/01/2016   COVID 2020   Depression    Fibromyalgia    Gastroparesis    GERD (gastroesophageal reflux disease)    Hypermobile Ehlers-Danlos syndrome    Irregular heart rate    Keratosis pilaris    Migraine    Seasonal allergic rhinitis 03/11/2014    Past Surgical History:  Procedure Laterality Date   ESOPHAGOGASTRODUODENOSCOPY ENDOSCOPY     03/2020   JOINT REPLACEMENT     LABRAL REPAIR Right 03/02/2021   Procedure: RIGHT HIP ARTHROSCOPY WITH LABRAL REPAIR AND PINCE DEBRIDEMENT;  Surgeon: [name], MD;  Location: [facility] OR;  Service: Orthopedics;  Laterality: Right;   SMART PILL PROCEDURE     12/2020   TYMPANOSTOMY TUBE PLACEMENT Bilateral    WISDOM TOOTH EXTRACTION      There were no vitals filed for this visit.   Subjective Assessment - 07/27/21 1600     Subjective Saw Dr. Steward Drone he didnt mention the injection.  He said to continue strengthen and attend PT as best as she can.    Currently  in Pain? Yes    Pain Score 3     Pain Location Hip    Pain Orientation Right;Anterior    Pain Descriptors / Indicators Aching;Sore    Pain Type Chronic pain    Pain Onset More than a month ago    Pain Frequency Intermittent    Aggravating Factors  varies, work does make it sore    Pain Relieving Factors rest, ice                [facility] Adult PT Treatment:                                                DATE: 07/27/21 Therapeutic Exercise: Elliptical L5 ramp 10 level 1, 5 min  Hip hinging x 10 with foam roller on wall Wall slides x 10 Standing balance : green band hip hinge shoulder extension x 10  Side facing Palloff press with slight rotation x 10 red band , standing on foam pad  Reverse lunge to SLS with 1 UE shoulder press Hip flexion red loop, less popping with Rt leg external rotation x 10  Hamstring  bridge with BOSU Bridge with march BOSU x 10  Sideplank on BOSU with clam x 15   Self care:  Hip ROM , mod exercises to reduce hip popping     PT Short Term Goals - 07/13/21 1649       PT SHORT TERM GOAL #1   Title Pt will be I with initial HEP for Rt hip ROM, strength in Rt LE    Status Achieved      PT SHORT TERM GOAL #2   Title Pt will be able to complete FOTO and understand results    Status Deferred      PT SHORT TERM GOAL #3   Title Pt will be able to walk without crutches in her home and min increase in pain    Status Achieved      PT SHORT TERM GOAL #4   Title Pt will be able to return to her apartment if tolerating gait and stairs with or without crutches.    Status Achieved               PT Long Term Goals - 07/13/21 1445       PT LONG TERM GOAL #1   Title Patient is independent with advanced HEP    Status On-going    Target Date 09/07/21      PT LONG TERM GOAL #2   Title Pt will be able to score FOTO TBA    Baseline not done    Time 8    Period Weeks    Status Deferred    Target Date 09/07/21      PT LONG TERM GOAL #3   Title Pt  will be able to lead with her Rt LE when walking up stairs in her home 50% of the time with pain minimal    Status Achieved      PT LONG TERM GOAL #4   Title Pt will be able to walk with full stride and no increase in pain for 1000 feet with no device    Status Achieved      PT LONG TERM GOAL #5   Title Pt will be able to demo strength in Rt hip to 4/5 in all planes    Baseline 4+/5    Time 8    Period Weeks    Status Achieved    Target Date 09/07/21      PT LONG TERM GOAL #6   Title Pt will be able to sit on the floor cross legged and get up without difficulty    Baseline tight in Rt hip 40 deg , painful    Time 8    Period Weeks    Status On-going    Target Date 09/07/21      PT LONG TERM GOAL #7   Title Pt will be able to participate in community yoga class with modifications    Baseline not done    Time 8    Period Weeks    Status On-going    Target Date 09/07/21      PT LONG TERM GOAL #8   Title Pt will work a full day with pain no more than 2/10 most of the time    Baseline 4/10 or more    Time 8    Period Weeks    Status New    Target Date 09/07/21                   Plan -  07/27/21 1637     Clinical Impression Statement Patient was late today but was able to exercise in standing for the majority of the session.  She has ongoing soreness in Rt hip but was able to show good stability of pelvis and lumbar spine today.  She has decreasd Rt hip ER but according to her it has "always been this way". She is hopeful that more PT may be able to improve her tolerance for standing and work activites.  She wonders if dry needling may help her.    Personal Factors and Comorbidities Time since onset of injury/illness/exacerbation;Finances;Sex;Profession;Past/Current Experience    PT Treatment/Interventions ADLs/Self Care Home Management;Biofeedback;Cryotherapy;Electrical Stimulation;Iontophoresis 4mg /ml Dexamethasone;Moist Heat;Ultrasound;Neuromuscular  re-education;Therapeutic exercise;Therapeutic activities;Patient/family education;Manual techniques;Dry needling;Taping;Spinal Manipulations;Joint Manipulations;Aquatic Therapy;Passive range of motion    PT Next Visit Plan closed chain strength as tolerated, step ups, etc manual Rt thigh if needed, Dry needle glute med?    PT Home Exercise Plan Access Code: [email or web address]    Consulted and Agree with Plan of Care Patient             Patient will benefit from skilled therapeutic intervention in order to improve the following deficits and impairments:  Decreased coordination, Increased fascial restricitons, Decreased endurance, Decreased activity tolerance, Dizziness, Pain, Decreased strength, Decreased mobility, Decreased range of motion, Hypermobility  Visit Diagnosis: Pain in right hip  Difficulty in walking, not elsewhere classified  Muscle weakness (generalized)  Hypermobility syndrome  Labral tear of hip, degenerative     Problem List Patient Active Problem List   Diagnosis Date Noted   Cervical adenopathy 06/10/2021   Chronic pruritus 06/10/2021   Exercise-induced asthma 06/09/2021   Chronic venous insufficiency 06/03/2021   Orthostatic lightheadedness 06/03/2021   Purple toe syndrome of both feet (HCC) 06/03/2021   Autonomic dysfunction 06/03/2021   Labral tear of hip, degenerative 04/13/2021   Migraine headache 03/01/2021   Headache 11/30/2020   Positive ANA (antinuclear antibody) 11/15/2020   Fibromyalgia 11/02/2020   Hair loss 11/02/2020   Muscle spasticity 10/22/2020   Hypermobile Ehlers-Danlos syndrome 10/22/2020   Paresthesia of bilateral legs 10/06/2020   Myalgia 10/06/2020   Chronic pelvic pain in female 10/06/2020   Chronic abdominal pain 10/06/2020   Irritable colon 08/02/2020   Pelvic floor dysfunction 08/02/2020   Irritant dermatitis 08/02/2020   Gastroesophageal reflux disease 06/28/2020   Severe recurrent major depression without psychotic features  (HCC) 06/28/2020   Polyarthralgia 06/28/2020   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 06/28/2020   Tachycardia 06/28/2020   History of COVID-19 06/28/2020   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 deficit 06/28/2020   Recurrent major depressive disorder, in full remission (HCC) 06/28/2020   Atypical chest pain 06/22/2020   Dizziness 06/22/2020   Palpitations 06/14/2020   Anxiety 05/04/2020   Internal and external hemorrhoids without complication 04/29/2020   Nondiabetic gastroparesis 03/03/2020   ETD (Eustachian tube dysfunction), bilateral 10/21/2019   Acne vulgaris 03/11/2014   Mild intermittent asthma without complication 03/11/2014   Seasonal allergies 03/11/2014    Alice Burnside, PT 07/27/2021, 4:46 PM  [facility] Outpatient Rehabilitation [facility] [address] [place], [place], [place] Phone: [phone]   Fax:  [phone]  Name: Brianna [name] MRN: [name] Date of Birth: Oct 11, 1995   12/19/1995, PT 07/27/21 4:46 PM Phone: [phone] Fax: [phone]

## 2021-07-29 DIAGNOSIS — B3731 Acute candidiasis of vulva and vagina: Secondary | ICD-10-CM | POA: Diagnosis not present

## 2021-07-30 DIAGNOSIS — F3341 Major depressive disorder, recurrent, in partial remission: Secondary | ICD-10-CM | POA: Diagnosis not present

## 2021-07-30 DIAGNOSIS — F411 Generalized anxiety disorder: Secondary | ICD-10-CM | POA: Diagnosis not present

## 2021-08-01 ENCOUNTER — Telehealth: Payer: Self-pay | Admitting: Gastroenterology

## 2021-08-01 NOTE — Telephone Encounter (Signed)
Good morning Dr. Lavon Paganini, ? ?Patient called requesting to schedule an appointment with you.  She is currently a patient of [facility] but is unhappy with her care there and would like to transfer to [facility].  Records are in Epic.  Can you please review and advise on scheduling?  Thank you. ?

## 2021-08-01 NOTE — Telephone Encounter (Signed)
Request received to transfer GI care from outside practice to [facility].  We appreciate the interest in our practice, however at this time due to high demand from patients without established GI providers we cannot accommodate this transfer.  Ability to accommodate future transfer requests may change over time and the patient can contact us again in 6-12 months if still interested in being seen at  GI.      °

## 2021-08-02 ENCOUNTER — Encounter: Payer: Self-pay | Admitting: Obstetrics and Gynecology

## 2021-08-02 ENCOUNTER — Ambulatory Visit: Payer: BC Managed Care – PPO

## 2021-08-02 ENCOUNTER — Other Ambulatory Visit (HOSPITAL_COMMUNITY)
Admission: RE | Admit: 2021-08-02 | Discharge: 2021-08-02 | Disposition: A | Discharge status: Home / Self Care | Payer: BC Managed Care – PPO | Source: Ambulatory Visit | Attending: Obstetrics and Gynecology | Admitting: Obstetrics and Gynecology

## 2021-08-02 ENCOUNTER — Ambulatory Visit (INDEPENDENT_AMBULATORY_CARE_PROVIDER_SITE_OTHER): Payer: BC Managed Care – PPO | Admitting: Obstetrics and Gynecology

## 2021-08-02 ENCOUNTER — Other Ambulatory Visit: Payer: Self-pay

## 2021-08-02 VITALS — BP 122/82 | HR 86 | Ht 64.0 in | Wt 102.0 lb

## 2021-08-02 DIAGNOSIS — R262 Difficulty in walking, not elsewhere classified: Secondary | ICD-10-CM

## 2021-08-02 DIAGNOSIS — M357 Hypermobility syndrome: Secondary | ICD-10-CM | POA: Diagnosis not present

## 2021-08-02 DIAGNOSIS — N898 Other specified noninflammatory disorders of vagina: Secondary | ICD-10-CM | POA: Insufficient documentation

## 2021-08-02 DIAGNOSIS — N926 Irregular menstruation, unspecified: Secondary | ICD-10-CM | POA: Diagnosis not present

## 2021-08-02 DIAGNOSIS — R102 Pelvic and perineal pain: Secondary | ICD-10-CM | POA: Diagnosis not present

## 2021-08-02 DIAGNOSIS — M24159 Other articular cartilage disorders, unspecified hip: Secondary | ICD-10-CM | POA: Diagnosis not present

## 2021-08-02 DIAGNOSIS — M25551 Pain in right hip: Secondary | ICD-10-CM

## 2021-08-02 DIAGNOSIS — M6281 Muscle weakness (generalized): Secondary | ICD-10-CM | POA: Diagnosis not present

## 2021-08-02 LAB — POCT URINALYSIS DIPSTICK
Bilirubin, UA: NEGATIVE
Blood, UA: NEGATIVE
Glucose, UA: NEGATIVE
Ketones, UA: NEGATIVE
Leukocytes, UA: NEGATIVE
Nitrite, UA: NEGATIVE
Protein, UA: NEGATIVE
Spec Grav, UA: 1.01 (ref 1.010–1.025)
Urobilinogen, UA: 0.2 E.U./dL
pH, UA: 5 (ref 5.0–8.0)

## 2021-08-02 LAB — POCT URINE PREGNANCY: Preg Test, Ur: NEGATIVE

## 2021-08-02 NOTE — Progress Notes (Signed)
Pt was negative for Chlamydia/Gonorrhea on 07/27/21 ?Positive for yeast on 07/27/21- treated with Diflucan ?

## 2021-08-02 NOTE — Therapy (Signed)
[facility] Outpatient Rehabilitation [facility] [address] [place], [place], [address] Phone: [phone]   Fax:  [phone]  Physical Therapy Treatment  Patient Details  Name: Brianna [name] MRN: [id number] Date of Birth: 01-Aug-1995 Referring Provider (PT): Dr. [name]   Encounter Date: 08/02/2021   PT End of Session - 08/02/21 1739     Visit Number 23    Number of Visits 29    Date for PT Re-Evaluation 09/07/21    Authorization Type BCBS    PT Start Time 1740    PT Stop Time 1825    PT Time Calculation (min) 45 min    Activity Tolerance Other (comment)   see impression   Behavior During Therapy [facility] for tasks assessed/performed             Past Medical History:  Diagnosis Date   Acute recurrent pansinusitis 10/21/2019   ADHD (attention deficit hyperactivity disorder)    inattentive   Anemia    Anxiety    Asthma    Bronchitis    Cat allergy due to both airborne and skin contact 05/01/2016   Chronic sore throat 05/01/2016   COVID 2020   Depression    Fibromyalgia    Gastroparesis    GERD (gastroesophageal reflux disease)    Hypermobile Ehlers-Danlos syndrome    Irregular heart rate    Keratosis pilaris    Migraine    Seasonal allergic rhinitis 03/11/2014    Past Surgical History:  Procedure Laterality Date   ESOPHAGOGASTRODUODENOSCOPY ENDOSCOPY     03/2020   JOINT REPLACEMENT     LABRAL REPAIR Right 03/02/2021   Procedure: RIGHT HIP ARTHROSCOPY WITH LABRAL REPAIR AND PINCE DEBRIDEMENT;  Surgeon: [name], MD;  [facility];  Service: Orthopedics;  Laterality: Right;   SMART PILL PROCEDURE     12/2020   TYMPANOSTOMY TUBE PLACEMENT Bilateral    WISDOM TOOTH EXTRACTION      There were no vitals filed for this visit.   Subjective Assessment - 08/02/21 1742     Subjective "It's doing alright. I'd say it's at like a two."    Currently in Pain? Yes    Pain Score 2     Pain Location Hip    Pain Orientation  Right;Anterior;Lateral    Pain Descriptors / Indicators Dull;Aching    Pain Type Chronic pain    Pain Onset More than a month ago    Pain Frequency Intermittent    Aggravating Factors  prolonged walking/sitting, stress    Pain Relieving Factors heat               [facility] Adult PT Treatment:                                                DATE: 08/02/21 Therapeutic Exercise: Elliptical ramp 10, level 1 x 5 minutes  Lateral band walks 3 sets d/b at counter; green band at shins  SLS on airex with hip IR/ER 2 x 10 bilateral Kneeling hip flexor stretch attempted (discontinued due to knee pain) Supine hip flexor stretch RLE x 60 seconds  Leg press with green band around thighs 2 x 10 @ 40 lbs  Verbal update to HEP to include lateral band walks Manual:  - STM to Rt hip flexor, quads, IT band, glute med. (Biofreeze utilized)  PT Education - 08/02/21 1815     Education Details education on TPDN indications, expectations, and side effects    Person(s) Educated Patient    Methods Explanation    Comprehension Verbalized understanding              PT Short Term Goals - 07/13/21 1649       PT SHORT TERM GOAL #1   Title Pt will be I with initial HEP for Rt hip ROM, strength in Rt LE    Status Achieved      PT SHORT TERM GOAL #2   Title Pt will be able to complete FOTO and understand results    Status Deferred      PT SHORT TERM GOAL #3   Title Pt will be able to walk without crutches in her home and min increase in pain    Status Achieved      PT SHORT TERM GOAL #4   Title Pt will be able to return to her apartment if tolerating gait and stairs with or without crutches.    Status Achieved               PT Long Term Goals - 07/13/21 1445       PT LONG TERM GOAL #1   Title Patient is independent with advanced HEP    Status On-going    Target Date 09/07/21      PT LONG TERM GOAL #2   Title Pt will be able to score  FOTO TBA    Baseline not done    Time 8    Period Weeks    Status Deferred    Target Date 09/07/21      PT LONG TERM GOAL #3   Title Pt will be able to lead with her Rt LE when walking up stairs in her home 50% of the time with pain minimal    Status Achieved      PT LONG TERM GOAL #4   Title Pt will be able to walk with full stride and no increase in pain for 1000 feet with no device    Status Achieved      PT LONG TERM GOAL #5   Title Pt will be able to demo strength in Rt hip to 4/5 in all planes    Baseline 4+/5    Time 8    Period Weeks    Status Achieved    Target Date 09/07/21      PT LONG TERM GOAL #6   Title Pt will be able to sit on the floor cross legged and get up without difficulty    Baseline tight in Rt hip 40 deg , painful    Time 8    Period Weeks    Status On-going    Target Date 09/07/21      PT LONG TERM GOAL #7   Title Pt will be able to participate in community yoga class with modifications    Baseline not done    Time 8    Period Weeks    Status On-going    Target Date 09/07/21      PT LONG TERM GOAL #8   Title Pt will work a full day with pain no more than 2/10 most of the time    Baseline 4/10 or more    Time 8    Period Weeks    Status New    Target Date 09/07/21  Plan - 08/02/21 1745     Clinical Impression Statement Patient gave verbal consent after education on TPDN and was prepped for this intervention of the Rt TFL and vastus lateralis, though once prepped patient became nervous, anxious, and tearful and declined TPDN during tonight's session. She tolerates gentle STM of the anterolateral hip musculature with notable tautness along proximal quadriceps/hip flexors. Able to progress standing/CKC strengthening with patient having mild difficulty maintaining stability during SL activity on the RLE. No increased hip pain reported with strengthening.    Personal Factors and Comorbidities Time since onset of  injury/illness/exacerbation;Finances;Sex;Profession;Past/Current Experience    PT Treatment/Interventions ADLs/Self Care Home Management;Biofeedback;Cryotherapy;Electrical Stimulation;Iontophoresis 4mg /ml Dexamethasone;Moist Heat;Ultrasound;Neuromuscular re-education;Therapeutic exercise;Therapeutic activities;Patient/family education;Manual techniques;Dry needling;Taping;Spinal Manipulations;Joint Manipulations;Aquatic Therapy;Passive range of motion    PT Next Visit Plan closed chain strength as tolerated, step ups, etc manual Rt thigh if needed    PT Home Exercise Plan Access Code: [email or web address]    Consulted and Agree with Plan of Care Patient             Patient will benefit from skilled therapeutic intervention in order to improve the following deficits and impairments:  Decreased coordination, Increased fascial restricitons, Decreased endurance, Decreased activity tolerance, Dizziness, Pain, Decreased strength, Decreased mobility, Decreased range of motion, Hypermobility  Visit Diagnosis: Pain in right hip  Difficulty in walking, not elsewhere classified  Muscle weakness (generalized)  Hypermobility syndrome  Labral tear of hip, degenerative     Problem List Patient Active Problem List   Diagnosis Date Noted   Cervical adenopathy 06/10/2021   Chronic pruritus 06/10/2021   Exercise-induced asthma 06/09/2021   Chronic venous insufficiency 06/03/2021   Orthostatic lightheadedness 06/03/2021   Purple toe syndrome of both feet (HCC) 06/03/2021   Autonomic dysfunction 06/03/2021   Labral tear of hip, degenerative 04/13/2021   Migraine headache 03/01/2021   Headache 11/30/2020   Positive ANA (antinuclear antibody) 11/15/2020   Fibromyalgia 11/02/2020   Hair loss 11/02/2020   Muscle spasticity 10/22/2020   Hypermobile Ehlers-Danlos syndrome 10/22/2020   Paresthesia of bilateral legs 10/06/2020   Myalgia 10/06/2020   Chronic pelvic pain in female 10/06/2020   Chronic  abdominal pain 10/06/2020   Irritable colon 08/02/2020   Pelvic floor dysfunction 08/02/2020   Irritant dermatitis 08/02/2020   Gastroesophageal reflux disease 06/28/2020   Severe recurrent major depression without psychotic features (HCC) 06/28/2020   Polyarthralgia 06/28/2020   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 06/28/2020   Tachycardia 06/28/2020   History of COVID-19 06/28/2020   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 deficit 06/28/2020   Recurrent major depressive disorder, in full remission (HCC) 06/28/2020   Atypical chest pain 06/22/2020   Dizziness 06/22/2020   Palpitations 06/14/2020   Anxiety 05/04/2020   Internal and external hemorrhoids without complication 04/29/2020   Nondiabetic gastroparesis 03/03/2020   ETD (Eustachian tube dysfunction), bilateral 10/21/2019   Acne vulgaris 03/11/2014   Mild intermittent asthma without complication 03/11/2014   Seasonal allergies 03/11/2014   Letitia Libra, PT, DPT, ATC 08/02/21 6:48 PM  [facility] Outpatient Rehabilitation [facility] [address] [place], [place], [address] Phone: [phone]   Fax:  [phone]  Name: Brianna [name] MRN: [id number] Date of Birth: January 12, 1996

## 2021-08-03 DIAGNOSIS — H43399 Other vitreous opacities, unspecified eye: Secondary | ICD-10-CM | POA: Diagnosis not present

## 2021-08-03 LAB — CERVICOVAGINAL ANCILLARY ONLY
Bacterial Vaginitis (gardnerella): NEGATIVE
Candida Glabrata: NEGATIVE
Candida Vaginitis: NEGATIVE
Chlamydia: NEGATIVE
Comment: NEGATIVE
Comment: NEGATIVE
Comment: NEGATIVE
Comment: NEGATIVE
Comment: NEGATIVE
Comment: NORMAL
Neisseria Gonorrhea: NEGATIVE
Trichomonas: NEGATIVE

## 2021-08-03 MED ORDER — FLUCONAZOLE 150 MG PO TABS: 150.0000 mg | ORAL_TABLET | Freq: Once | ORAL | 0 refills | Status: AC

## 2021-08-03 MED ORDER — TERCONAZOLE 0.8 % VA CREA: 1.0000 | TOPICAL_CREAM | Freq: Every day | VAGINAL | 0 refills | Status: DC

## 2021-08-03 NOTE — Progress Notes (Signed)
? ? ?GYNECOLOGY ENCOUNTER NOTE ? ?History:    ? Brianna [name] is a 26 y.o. G87P0010 female here with concerns about discharge and bumps near her bottom. She reports testing positive for yeast on 3/8 at urgent care. She was given diflucan 2 doses. She was able to take the first dose and then vomited the second dose.  ? ? ?Gynecologic History ?Patient's last menstrual period was 06/25/2021. ? ? ?Obstetric History ?OB History  ?Gravida Para Term Preterm AB Living  ?1 0   0 1 0  ?SAB IAB Ectopic Multiple Live Births  ?  1 0   0  ?  ?# Outcome Date GA Lbr Len/2nd Weight Sex Delivery Anes PTL Lv  ?1 IAB           ? ? ?Past Medical History:  ?Diagnosis Date  ? Acute recurrent pansinusitis 10/21/2019  ? ADHD (attention deficit hyperactivity disorder)   ? inattentive  ? Anemia   ? Anxiety   ? Asthma   ? Bronchitis   ? Cat allergy due to both airborne and skin contact 05/01/2016  ? Chronic sore throat 05/01/2016  ? COVID 2020  ? Depression   ? Fibromyalgia   ? Gastroparesis   ? GERD (gastroesophageal reflux disease)   ? Hypermobile Ehlers-Danlos syndrome   ? Irregular heart rate   ? Keratosis pilaris   ? Migraine   ? Seasonal allergic rhinitis 03/11/2014  ? ? ?Past Surgical History:  ?Procedure Laterality Date  ? ESOPHAGOGASTRODUODENOSCOPY ENDOSCOPY    ? 03/2020  ? JOINT REPLACEMENT    ? LABRAL REPAIR Right 03/02/2021  ? Procedure: RIGHT HIP ARTHROSCOPY WITH LABRAL REPAIR AND PINCE DEBRIDEMENT;  Surgeon: Vanetta Mulders, MD;  Location: [place];  Service: Orthopedics;  Laterality: Right;  ? SMART PILL PROCEDURE    ? 12/2020  ? TYMPANOSTOMY TUBE PLACEMENT Bilateral   ? WISDOM TOOTH EXTRACTION    ? ? ?Current Outpatient Medications on File Prior to Visit  ?Medication Sig Dispense Refill  ? Cholecalciferol (VITAMIN D3 PO) Take 1 capsule by mouth every 3 (three) days.    ? Cholecalciferol 25 MCG (1000 UT) tablet Take by mouth.    ? cholestyramine light (PREVALITE) 4 GM/DOSE powder Take by mouth.    ? Ferrous Sulfate Dried (EQ  SLOW-RELEASE IRON) 45 MG TBCR Take 1 tablet by mouth every 3 (three) days.    ? fluticasone (FLONASE) 50 MCG/ACT nasal spray SPRAY 1 SPRAY INTO BOTH NOSTRILS DAILY. 16 mL 1  ? loratadine (CLARITIN) 10 MG tablet Take 1 tablet (10 mg total) by mouth daily. 90 tablet 1  ? Multiple Vitamins-Minerals (ADULT GUMMY PO) Take 2 capsules by mouth daily.    ? Omega-3 Fatty Acids (FISH OIL PO) Take 700 Units by mouth.    ? ondansetron (ZOFRAN ODT) 4 MG disintegrating tablet Take 1 tablet (4 mg total) by mouth every 8 (eight) hours as needed for nausea or vomiting. 20 tablet 5  ? promethazine (PHENERGAN) 25 MG tablet Take 1 tablet (25 mg total) by mouth every 6 (six) hours as needed for nausea or vomiting. 30 tablet 0  ? Prucalopride Succinate 2 MG TABS Take 2 mg by mouth daily.    ? scopolamine (TRANSDERM-SCOP, 1.5 MG,) 1 MG/3DAYS Place 1 patch (1.5 mg total) onto the skin every 3 (three) days. 4 patch 0  ? spironolactone (ALDACTONE) 50 MG tablet Take 1 tablet by mouth daily.    ? ?No current facility-administered medications on file prior to visit.  ? ? ?  Allergies  ?Allergen Reactions  ? Clindamycin Hives, Swelling and Rash  ? ? ?Social History:  reports that she quit smoking about 2 years ago. Her smoking use included e-cigarettes and cigarettes. She has never used smokeless tobacco. She reports that she does not currently use alcohol. She reports current drug use. Drug: Marijuana. ? ?Family History  ?Problem Relation Age of Onset  ? Diabetes Mother   ? Polycystic ovary syndrome Mother   ? GER disease Father   ? Heart disease Paternal Uncle   ? ? ?The following portions of the patient's history were reviewed and updated as appropriate: allergies, current medications, past family history, past medical history, past social history, past surgical history and problem list. ? ?Review of Systems ?Pertinent items noted in HPI and remainder of comprehensive ROS otherwise negative. ? ?Physical Exam:  ?BP 122/82   Pulse 86   Ht 5\' 4"   (1.626 m)   Wt 102 lb (46.3 kg)   LMP 06/25/2021   BMI 17.51 kg/m?  ?CONSTITUTIONAL: Well-developed, well-nourished female in no acute distress.  ?HENT:  Normocephalic ?SKIN: Skin is warm and dry.  ?MUSCULOSKELETAL: Normal range of motion.Marland Kitchen ?NEUROLOGIC: Alert and oriented to person, place, and time.   ?PSYCHIATRIC: Normal mood and affect, anxious mood.  ?PELVIC: Normal appearing external genitalia and urethral meatus; normal appearing vaginal mucosa and cervix. Moderate amount of Thin, white vaginal discharge noted.   Performed in the presence of a chaperone. Right labial minora skin changes right near opening of vagina. No erythema or blistering. HSV swab collected.  ?  ?Assessment and Plan:  ?1. Irregular periods ? ?- POCT urine pregnancy ? ?2. Pelvic pain ? ?- POCT Urinalysis Dipstick ?- Urine Culture ?- Cervicovaginal ancillary only( [place]) ? ?3. Vaginal irritation ? ?- POCT Urinalysis Dipstick ?- Urine Culture ?- Cervicovaginal ancillary only( [place]) ?- Herpes simplex virus culture ?- Rx: Terazol and 1 dose of diflucan.  ? ?Tish Begin, Artist Pais, NP ?Faculty Practice ?[facility]  ?

## 2021-08-03 NOTE — Telephone Encounter (Signed)
Spoke with patient to inform we cannot accommodate transfer at this time. ?

## 2021-08-04 LAB — URINE CULTURE
MICRO NUMBER:: 13131730
Result:: NO GROWTH
SPECIMEN QUALITY:: ADEQUATE

## 2021-08-04 LAB — HERPES SIMPLEX VIRUS CULTURE
MICRO NUMBER:: 13130141
SPECIMEN QUALITY:: ADEQUATE

## 2021-08-05 DIAGNOSIS — R790 Abnormal level of blood mineral: Secondary | ICD-10-CM | POA: Diagnosis not present

## 2021-08-05 DIAGNOSIS — F411 Generalized anxiety disorder: Secondary | ICD-10-CM | POA: Diagnosis not present

## 2021-08-05 DIAGNOSIS — F3341 Major depressive disorder, recurrent, in partial remission: Secondary | ICD-10-CM | POA: Diagnosis not present

## 2021-08-05 DIAGNOSIS — G4486 Cervicogenic headache: Secondary | ICD-10-CM | POA: Diagnosis not present

## 2021-08-05 DIAGNOSIS — R200 Anesthesia of skin: Secondary | ICD-10-CM | POA: Diagnosis not present

## 2021-08-05 DIAGNOSIS — G894 Chronic pain syndrome: Secondary | ICD-10-CM | POA: Diagnosis not present

## 2021-08-05 DIAGNOSIS — G43709 Chronic migraine without aura, not intractable, without status migrainosus: Secondary | ICD-10-CM | POA: Diagnosis not present

## 2021-08-08 DIAGNOSIS — E611 Iron deficiency: Secondary | ICD-10-CM | POA: Diagnosis not present

## 2021-08-10 ENCOUNTER — Ambulatory Visit: Payer: BC Managed Care – PPO

## 2021-08-10 ENCOUNTER — Encounter: Payer: BC Managed Care – PPO | Admitting: Physical Therapy

## 2021-08-10 ENCOUNTER — Other Ambulatory Visit: Payer: Self-pay

## 2021-08-10 DIAGNOSIS — M6281 Muscle weakness (generalized): Secondary | ICD-10-CM

## 2021-08-10 DIAGNOSIS — R262 Difficulty in walking, not elsewhere classified: Secondary | ICD-10-CM | POA: Diagnosis not present

## 2021-08-10 DIAGNOSIS — M25551 Pain in right hip: Secondary | ICD-10-CM

## 2021-08-10 DIAGNOSIS — M357 Hypermobility syndrome: Secondary | ICD-10-CM

## 2021-08-10 DIAGNOSIS — M24159 Other articular cartilage disorders, unspecified hip: Secondary | ICD-10-CM | POA: Diagnosis not present

## 2021-08-10 NOTE — Therapy (Signed)
[place] [facility] ?[address] ?[place], [place], [address] ?Phone: [phone]   Fax:  [phone] ? ?Physical Therapy Treatment ? ?Patient Details  ?Name: Brianna [name] ?MRN: [id number] ?Date of Birth: 1996/02/25 ?Referring Provider (PT): Dr. Huel CoteSteven Bokshan ? ? ?Encounter Date: 08/10/2021 ? ? PT End of Session - 08/10/21 1746   ? ? Visit Number 24   ? Number of Visits 29   ? Date for PT Re-Evaluation 09/07/21   ? Authorization Type BCBS   ? PT Start Time 1744   ? PT Stop Time 1826   ? PT Time Calculation (min) 42 min   ? Activity Tolerance Other (comment)   ? [facility] tasks assessed/performed   ? ?  ?  ? ?  ? ? ?Past Medical History:  ?Diagnosis Date  ? Acute recurrent pansinusitis 10/21/2019  ? ADHD (attention deficit hyperactivity disorder)   ? inattentive  ? Anemia   ? Anxiety   ? Asthma   ? Bronchitis   ? Cat allergy due to both airborne and skin contact 05/01/2016  ? Chronic sore throat 05/01/2016  ? COVID 2020  ? Depression   ? Fibromyalgia   ? Gastroparesis   ? GERD (gastroesophageal reflux disease)   ? Hypermobile Ehlers-Danlos syndrome   ? Irregular heart rate   ? Keratosis pilaris   ? Migraine   ? Seasonal allergic rhinitis 03/11/2014  ? ? ?Past Surgical History:  ?Procedure Laterality Date  ? ESOPHAGOGASTRODUODENOSCOPY ENDOSCOPY    ? 03/2020  ? JOINT REPLACEMENT    ? LABRAL REPAIR Right 03/02/2021  ? Procedure: RIGHT HIP ARTHROSCOPY WITH LABRAL REPAIR AND PINCE DEBRIDEMENT;  Surgeon: Huel CoteBokshan, Steven, MD;  Location: [facility] OR;  Service: Orthopedics;  Laterality: Right;  ? SMART PILL PROCEDURE    ? 12/2020  ? TYMPANOSTOMY TUBE PLACEMENT Bilateral   ? WISDOM TOOTH EXTRACTION    ? ? ?There were no vitals filed for this visit. ? ? Subjective Assessment - 08/10/21 1748   ? ? Subjective I'm doing good.   ? Pertinent History gastroparesis I dropped 50#;  now holding 105# but considered 25# underweight (working with nutritionist)   ?  Limitations Walking;Standing;Lifting   ? How long can you walk comfortably? < 1/2 mile;  walk a lot at work   ? Patient Stated Goals reduce pelvic floor pain and increase strength. learn techniques that can help;  I would be doing outdoor activities (hiking, climbing);  I do yoga but stopped recently;   I would like to work in the medical field (I'd like to be a PA)   ? Currently in Pain? Yes   ? Pain Score 3    ? Pain Location Hip   ? Pain Orientation Right   ? Pain Descriptors / Indicators Dull;Aching   ? Pain Type Chronic pain   ? Pain Onset More than a month ago   ? Pain Frequency Intermittent   ? Aggravating Factors  prolonged walking/sitting, stress   ? Pain Relieving Factors heat   ? Effect of Pain on Daily Activities work is a lot   ? Multiple Pain Sites No   ? ?  ?  ? ?  ? ? ? ?[facility] Adult PT Treatment:  DATE: 08/11/2021 ?Therapeutic Exercise: ?Elliptical ramp 15, level 1 x 8 minutes  ?Lateral band walks 3 sets d/b at counter; green band at shins  ?SLS on airex with hip IR/ER x 10 bilateral ?Cybex hip abduction 17.5 x15 BIL ?Cybex hip extension 17.5 x15 BIL ?Cybex hip flexion 17.5 x15 BIL ?Monster walks with BlueTB around thighs forwards/backwards 3 laps ?Small lateral steps with BlueTB in athletic stance x 3 laps ?SLS on airex palloff press GTB x10 each  ? ? ? ?[facility] Adult PT Treatment:                                                DATE: 08/02/21 ?Therapeutic Exercise: ?Elliptical ramp 10, level 1 x 5 minutes  ?Lateral band walks 3 sets d/b at counter; green band at shins  ?SLS on airex with hip IR/ER 2 x 10 bilateral ?Kneeling hip flexor stretch attempted (discontinued due to knee pain) ?Supine hip flexor stretch RLE x 60 seconds  ?Leg press with green band around thighs 2 x 10 @ 40 lbs  ?Verbal update to HEP to include lateral band walks ?Manual:  ?- STM to Rt hip flexor, quads, IT band, glute med. (Biofreeze utilized)  ? ? ? ? ? ? PT Short Term Goals - 07/13/21  1649   ? ?  ? PT SHORT TERM GOAL #1  ? Title Pt will be I with initial HEP for Rt hip ROM, strength in Rt LE   ? Status Achieved   ?  ? PT SHORT TERM GOAL #2  ? Title Pt will be able to complete FOTO and understand results   ? Status Deferred   ?  ? PT SHORT TERM GOAL #3  ? Title Pt will be able to walk without crutches in her home and min increase in pain   ? Status Achieved   ?  ? PT SHORT TERM GOAL #4  ? Title Pt will be able to return to her apartment if tolerating gait and stairs with or without crutches.   ? Status Achieved   ? ?  ?  ? ?  ? ? ? ? PT Long Term Goals - 07/13/21 1445   ? ?  ? PT LONG TERM GOAL #1  ? Title Patient is independent with advanced HEP   ? Status On-going   ? Target Date 09/07/21   ?  ? PT LONG TERM GOAL #2  ? Title Pt will be able to score FOTO TBA   ? Baseline not done   ? Time 8   ? Period Weeks   ? Status Deferred   ? Target Date 09/07/21   ?  ? PT LONG TERM GOAL #3  ? Title Pt will be able to lead with her Rt LE when walking up stairs in her home 50% of the time with pain minimal   ? Status Achieved   ?  ? PT LONG TERM GOAL #4  ? Title Pt will be able to walk with full stride and no increase in pain for 1000 feet with no device   ? Status Achieved   ?  ? PT LONG TERM GOAL #5  ? Title Pt will be able to demo strength in Rt hip to 4/5 in all planes   ? Baseline 4+/5   ? Time 8   ? Period Weeks   ?  Status Achieved   ? Target Date 09/07/21   ?  ? PT LONG TERM GOAL #6  ? Title Pt will be able to sit on the floor cross legged and get up without difficulty   ? Baseline tight in Rt hip 40 deg , painful   ? Time 8   ? Period Weeks   ? Status On-going   ? Target Date 09/07/21   ?  ? PT LONG TERM GOAL #7  ? Title Pt will be able to participate in community yoga class with modifications   ? Baseline not done   ? Time 8   ? Period Weeks   ? Status On-going   ? Target Date 09/07/21   ?  ? PT LONG TERM GOAL #8  ? Title Pt will work a full day with pain no more than 2/10 most of the time   ?  Baseline 4/10 or more   ? Time 8   ? Period Weeks   ? Status New   ? Target Date 09/07/21   ? ?  ?  ? ?  ? ? ? ? ? ? ? Plan - 08/10/21 1754   ? ? Clinical Impression Statement Patient presents to PT with mild muscle soreness in her R hip. She is discouraged about the pain still being present this far into therapy. She was able to tolerate increased resistance on band walking exercises and enjoyed the monster walks. She also did well on Cybex hip machine, able to tolerate increased resistance from 12.5 to 17.5 pounds. No increased hip pain reported throughout session.   ? Personal Factors and Comorbidities Time since onset of injury/illness/exacerbation;Finances;Sex;Profession;Past/Current Experience   ? Examination-Activity Limitations Locomotion Level;Toileting;Stand;Lift;Dressing;Squat;Stairs   ? Examination-Participation Restrictions Occupation;Community Activity;Interpersonal Relationship;Shop   ? Stability/Clinical Decision Making Stable/Uncomplicated   ? Clinical Decision Making Low   ? Rehab Potential Excellent   ? PT Frequency 1x / week   ? PT Duration 6 weeks   ? PT Treatment/Interventions ADLs/Self Care Home Management;Biofeedback;Cryotherapy;Electrical Stimulation;Iontophoresis 4mg /ml Dexamethasone;Moist Heat;Ultrasound;Neuromuscular re-education;Therapeutic exercise;Therapeutic activities;Patient/family education;Manual techniques;Dry needling;Taping;Spinal Manipulations;Joint Manipulations;Aquatic Therapy;Passive range of motion   ? PT Next Visit Plan closed chain strength as tolerated, step ups, etc manual Rt thigh if needed   ? PT Home Exercise Plan Access Code: [email or web address]   ? Consulted and Agree with Plan of Care Patient   ? ?  ?  ? ?  ? ? ?Patient will benefit from skilled therapeutic intervention in order to improve the following deficits and impairments:  Decreased coordination, Increased fascial restricitons, Decreased endurance, Decreased activity tolerance, Dizziness, Pain, Decreased strength,  Decreased mobility, Decreased range of motion, Hypermobility ? ?Visit Diagnosis: ?Pain in right hip ? ?Difficulty in walking, not elsewhere classified ? ?Muscle weakness (generalized) ? ?Hypermobility syndrome ? ? ?

## 2021-08-11 DIAGNOSIS — Z833 Family history of diabetes mellitus: Secondary | ICD-10-CM | POA: Diagnosis not present

## 2021-08-11 DIAGNOSIS — L659 Nonscarring hair loss, unspecified: Secondary | ICD-10-CM | POA: Diagnosis not present

## 2021-08-11 DIAGNOSIS — L700 Acne vulgaris: Secondary | ICD-10-CM | POA: Diagnosis not present

## 2021-08-11 DIAGNOSIS — N912 Amenorrhea, unspecified: Secondary | ICD-10-CM | POA: Diagnosis not present

## 2021-08-15 ENCOUNTER — Other Ambulatory Visit: Payer: Self-pay

## 2021-08-15 ENCOUNTER — Other Ambulatory Visit: Payer: Self-pay | Admitting: Obstetrics and Gynecology

## 2021-08-15 ENCOUNTER — Encounter: Payer: Self-pay | Admitting: Obstetrics and Gynecology

## 2021-08-15 ENCOUNTER — Ambulatory Visit (INDEPENDENT_AMBULATORY_CARE_PROVIDER_SITE_OTHER): Payer: BC Managed Care – PPO | Admitting: Family Medicine

## 2021-08-15 DIAGNOSIS — N898 Other specified noninflammatory disorders of vagina: Secondary | ICD-10-CM

## 2021-08-15 DIAGNOSIS — F32A Depression, unspecified: Secondary | ICD-10-CM | POA: Diagnosis not present

## 2021-08-15 DIAGNOSIS — F411 Generalized anxiety disorder: Secondary | ICD-10-CM | POA: Diagnosis not present

## 2021-08-15 DIAGNOSIS — K3184 Gastroparesis: Secondary | ICD-10-CM

## 2021-08-15 MED ORDER — AZITHROMYCIN 500 MG PO TABS: 1000.0000 mg | ORAL_TABLET | Freq: Once | ORAL | 1 refills | Status: AC
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
## 2021-08-15 NOTE — Patient Instructions (Addendum)
-   Bile acid diarrhea: Usual treatment is combination of a bile acid sequestrant and a low-fat diet.  You may want to ask your gastroenterologist re. alternatives to cholestyarmine such as celestipol or possible (off-label) use of colesevelam.   ? ?- In addition, certain vegetables have been shown to effectively bind bile acids:  Daily intake of steamed collard greens, kale, mustard greens, broccoli, green bell pepper, or cabbage.   ?(See [employer] [facility]. Steam cooking significantly improves in vitro bile acid binding of collard greens, kale, mustard greens, broccoli, green bell pepper, and cabbage. Nutrition Research 2008 Jun;28(6):351-7. doi: 10.1016/j.nutres.2008.03.007. PMID: 16109604.) ? ?- Continue with small, frequent meals and snacks low in fat, and get ~60 oz of water per day.    ? ?- When you see the RD at [facility], ask her to sign a release of info form if you'd like to have me talk with her.  (And email or fax the form - [phone], attn Dr. Gerilyn Pilgrim).  I will send you a ROI form for Loryn to sign, which she can also fax to me at the above number.   ? ?Healing your relationship with food will include understanding what drives your food choices, e.g., why do you turn to food when feeling negative emotions? ?- When you are struggling, ask yourself these "decoding" questions: ?   1. What am I feeling right now? ?   2. What do I want to feel? ?   3. What do I truly need right now? ?Use the Feelings and Needs lists, and WRITE your answers.   ?You are looking for feelings, not thoughts.  Bring your responses to your follow-up appt.    ? ?At follow-up we will talk about polyvagal theory and how this might relate to your digestion.   ? ?- Follow-up video appt on Tuesday, April 25 at 4:30 PM.   ?  ?

## 2021-08-16 ENCOUNTER — Ambulatory Visit: Payer: BC Managed Care – PPO

## 2021-08-16 ENCOUNTER — Ambulatory Visit: Payer: BC Managed Care – PPO | Admitting: Obstetrics and Gynecology

## 2021-08-17 IMAGING — CT CT ABD-PELV W/O CM
1 of 2 series · 14 of 32 positions shown, 19 images · non-contrast
Comparison: None.

CLINICAL DATA: Right lower quadrant pain. GI issues with watery
diarrhea for several months. Weight loss, malabsorption.

EXAM:
CT ABDOMEN AND PELVIS WITHOUT CONTRAST
TECHNIQUE: Multidetector CT imaging of the abdomen and pelvis was performed
following the standard protocol without IV contrast.

[Series 2: abd/pelvis w/(date) · axial · 0.65mm/px · z∈[-444,-59]mm · 14 of 87 slices shown, 19 images]
[im 5/87  soft-tissue]
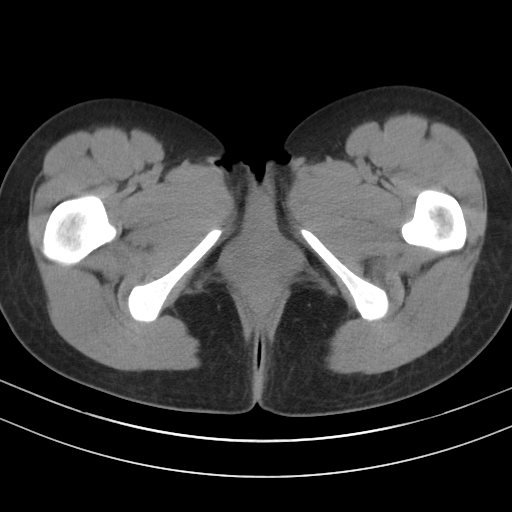
[im 5/87  bone]
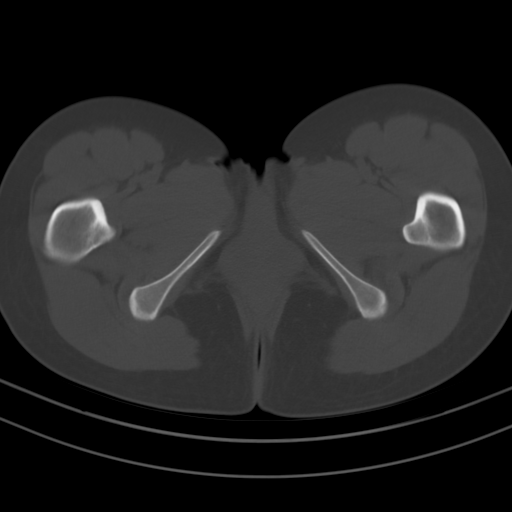
[im 10/87  soft-tissue]
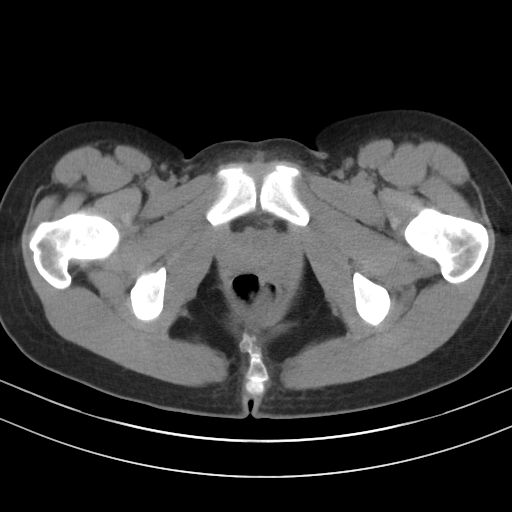
[im 20/87  soft-tissue]
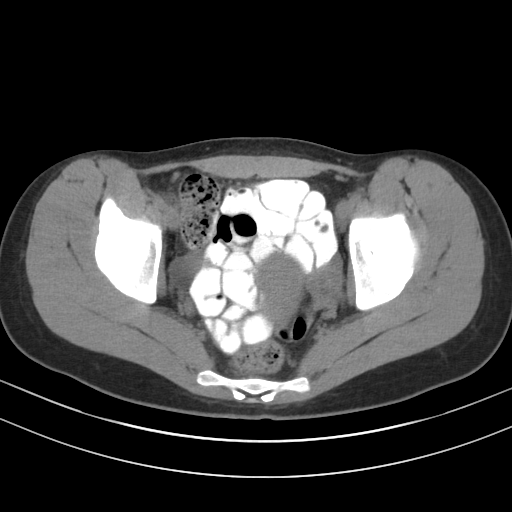
[im 24/87  soft-tissue]
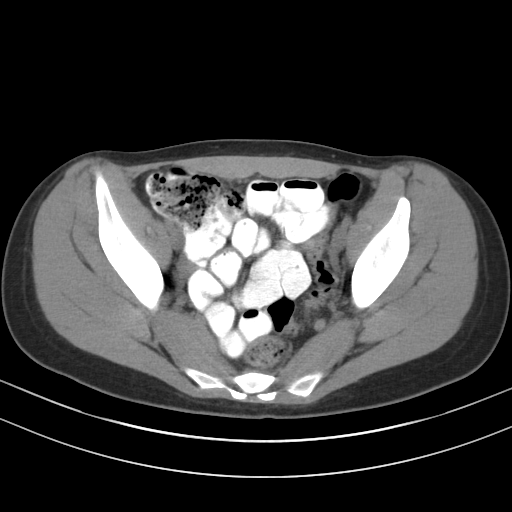
[im 29/87  soft-tissue]
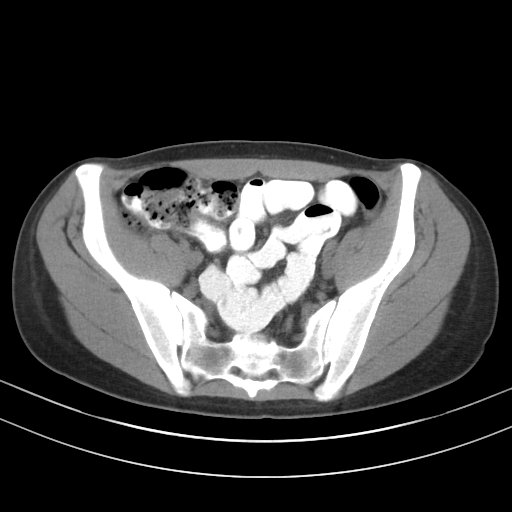
[im 39/87  soft-tissue]
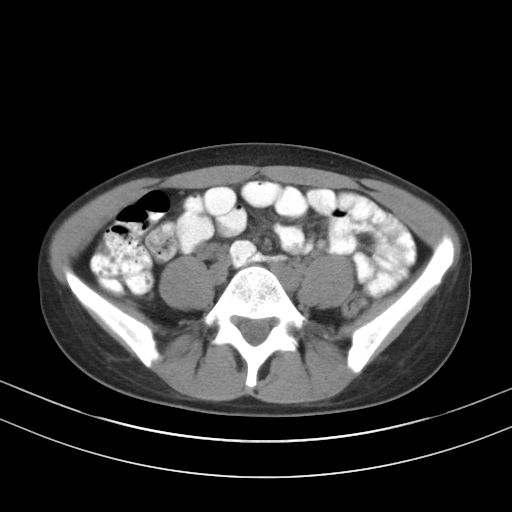
[im 44/87  soft-tissue]
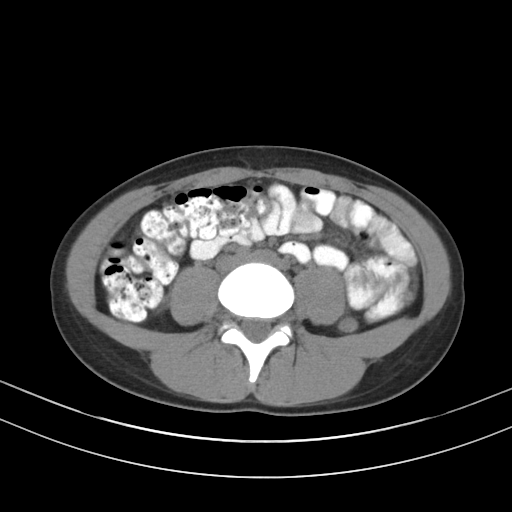
[im 48/87  soft-tissue]
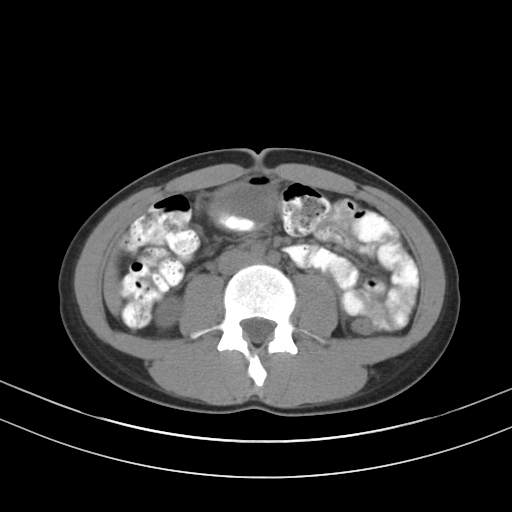
[im 58/87  soft-tissue]
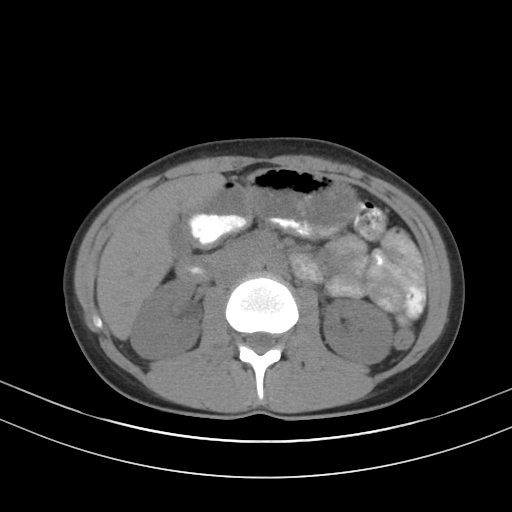
[im 58/87  bone]
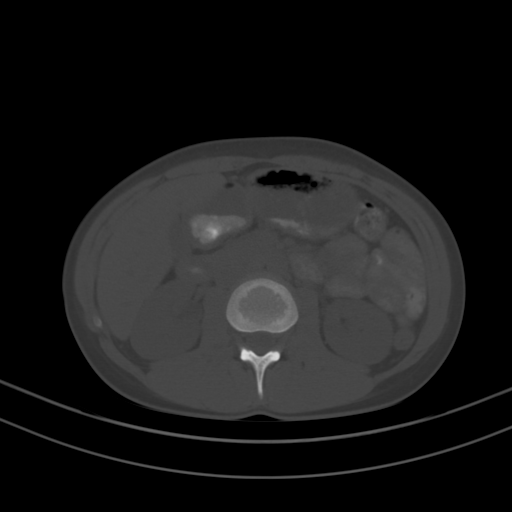
[im 63/87  soft-tissue]
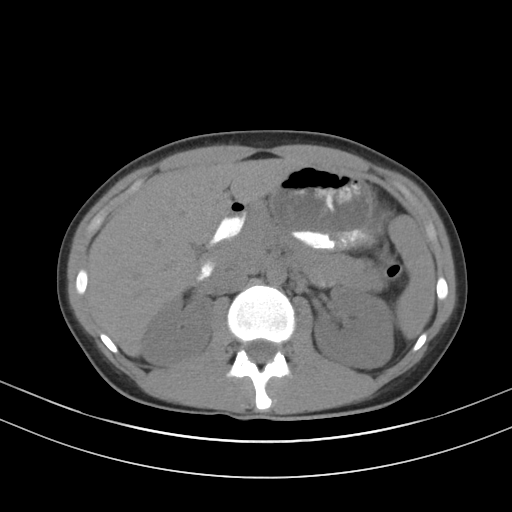
[im 67/87  soft-tissue]
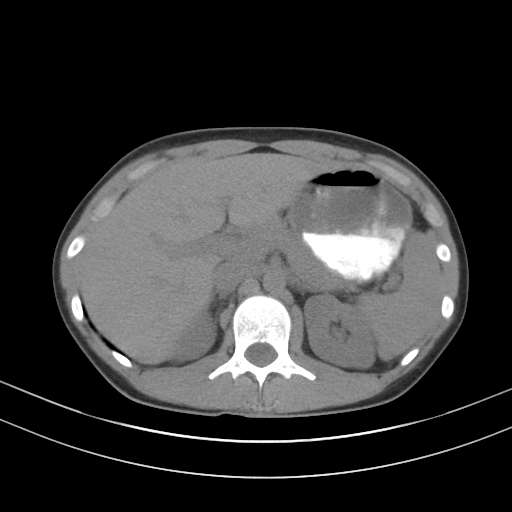
[im 67/87  lung]
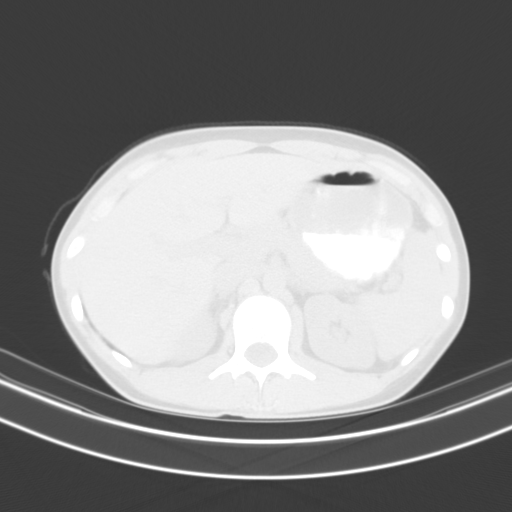
[im 72/87  lung]
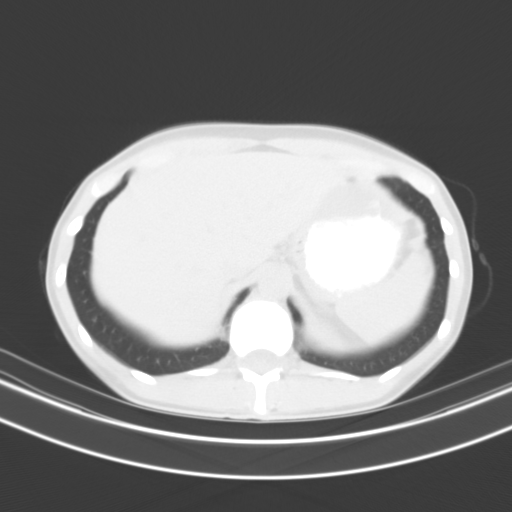
[im 77/87  soft-tissue]
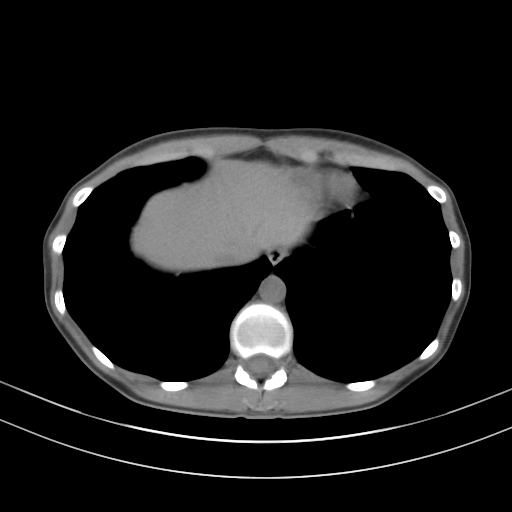
[im 77/87  lung]
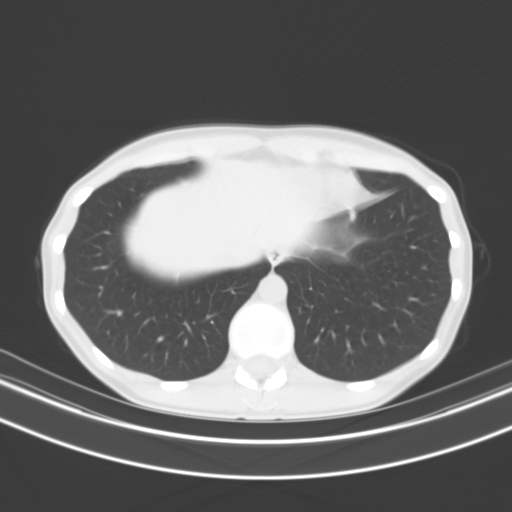
[im 82/87  soft-tissue]
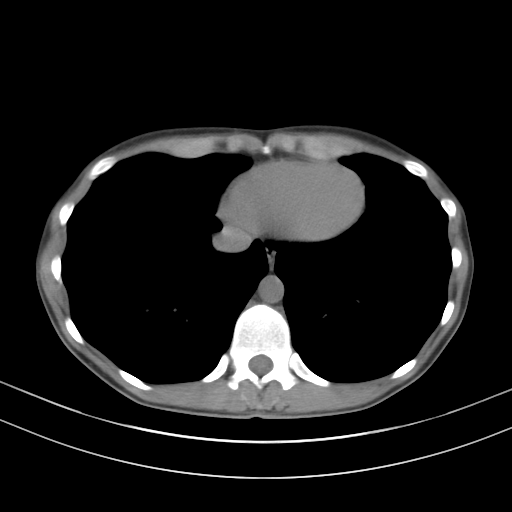
[im 82/87  lung]
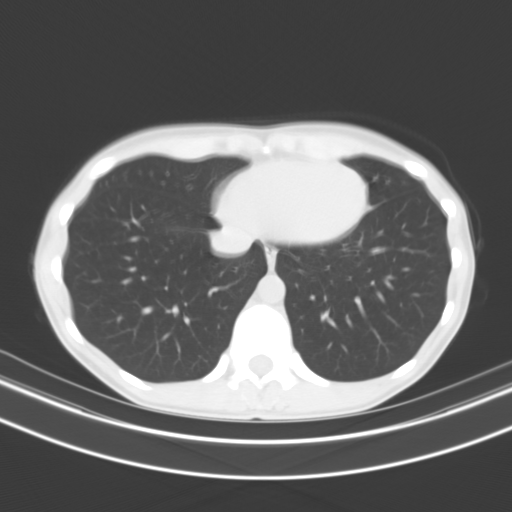

[14 of 32 positions shown; findings below may reference images not displayed]

FINDINGS: Lower chest: No acute abnormality.

Hepatobiliary: No focal liver abnormality. No gallstones,
gallbladder wall thickening, or pericholecystic fluid. No biliary
dilatation.

Pancreas: No focal lesion. Normal pancreatic contour. No surrounding
inflammatory changes. No main pancreatic ductal dilatation.

Spleen: Normal in size without focal abnormality.

Adrenals/Urinary Tract: No adrenal nodule bilaterally. No
nephrolithiasis, no hydronephrosis, and no contour-deforming renal
mass. No ureterolithiasis or hydroureter. The urinary bladder is
unremarkable.

Stomach/Bowel: PO contrast reaches the splenic flexure. Stomach is
within normal limits. No evidence of bowel wall thickening or
dilatation. Appendix appears normal.

Vascular/Lymphatic: No significant vascular findings are present. No
enlarged abdominal or pelvic lymph nodes.

Reproductive: Uterus and bilateral adnexa are unremarkable.

Other: No intraperitoneal free fluid. No intraperitoneal free gas.
No organized fluid collection.

Musculoskeletal:

No abdominal wall hernia or abnormality.

No suspicious lytic or blastic osseous lesions. No acute displaced
fracture.
IMPRESSION: No acute intra-abdominal or intrapelvic abnormality on this
noncontrast study. Please note limited evaluation without
intravenous contrast.

## 2021-08-18 ENCOUNTER — Encounter: Payer: Self-pay | Admitting: *Deleted

## 2021-08-22 ENCOUNTER — Ambulatory Visit: Payer: BC Managed Care – PPO | Admitting: Obstetrics & Gynecology

## 2021-08-23 ENCOUNTER — Ambulatory Visit: Payer: BC Managed Care – PPO

## 2021-08-25 ENCOUNTER — Encounter: Payer: Self-pay | Admitting: Obstetrics and Gynecology

## 2021-08-25 ENCOUNTER — Ambulatory Visit (INDEPENDENT_AMBULATORY_CARE_PROVIDER_SITE_OTHER): Payer: BC Managed Care – PPO | Admitting: Obstetrics and Gynecology

## 2021-08-25 VITALS — BP 116/67 | HR 80 | Resp 16 | Ht 64.0 in | Wt 101.0 lb

## 2021-08-25 DIAGNOSIS — R399 Unspecified symptoms and signs involving the genitourinary system: Secondary | ICD-10-CM

## 2021-08-25 DIAGNOSIS — N898 Other specified noninflammatory disorders of vagina: Secondary | ICD-10-CM

## 2021-08-25 MED ORDER — SULFAMETHOXAZOLE-TRIMETHOPRIM 800-160 MG PO TABS: 1.0000 | ORAL_TABLET | Freq: Two times a day (BID) | ORAL | 0 refills | Status: AC

## 2021-08-25 NOTE — Progress Notes (Signed)
? ?GYNECOLOGY OFFICE VISIT NOTE ? ?History:  ? Brianna [name] is a 26 y.o. G1P0010 here today for follow up vaginal cultures.  ? ?12/2020 negative for yeast/bv/trich/gc/ct.  ?02/2021 was positive for yeast ?06/2021: negative for gc/ct/trich ?07/27/2021: positive for yeast ?08/02/2021: negative for yeast/bv/trich/gc/ct/hsv ? ?On 3/14, she was seen for vaginal discharge and bumps she was concerned about near her bottom.  ? ?Vaginal discharge has been yellow and present for some time. She has taken a variety of ABX without improvement. She has done Azithromycin and no relief. She has done diflucan which sometimes helps but not permanently.  ? ? ? ?  ?Past Medical History:  ?Diagnosis Date  ? Acute recurrent pansinusitis 10/21/2019  ? ADHD (attention deficit hyperactivity disorder)   ? inattentive  ? Anemia   ? Anxiety   ? Asthma   ? Bronchitis   ? Cat allergy due to both airborne and skin contact 05/01/2016  ? Chronic sore throat 05/01/2016  ? COVID 2020  ? Depression   ? Fibromyalgia   ? Gastroparesis   ? GERD (gastroesophageal reflux disease)   ? Hypermobile Ehlers-Danlos syndrome   ? Irregular heart rate   ? Keratosis pilaris   ? Migraine   ? Seasonal allergic rhinitis 03/11/2014  ? ? ?Past Surgical History:  ?Procedure Laterality Date  ? ESOPHAGOGASTRODUODENOSCOPY ENDOSCOPY    ? 03/2020  ? JOINT REPLACEMENT    ? LABRAL REPAIR Right 03/02/2021  ? Procedure: RIGHT HIP ARTHROSCOPY WITH LABRAL REPAIR AND PINCE DEBRIDEMENT;  Surgeon: [name], MD;  Location: [facility] OR;  Service: Orthopedics;  Laterality: Right;  ? SMART PILL PROCEDURE    ? 12/2020  ? TYMPANOSTOMY TUBE PLACEMENT Bilateral   ? WISDOM TOOTH EXTRACTION    ? ? ?The following portions of the patient's history were reviewed and updated as appropriate: allergies, current medications, past family history, past medical history, past social history, past surgical history and problem list.  ? ?Health Maintenance:   ?Normal pap  ?Diagnosis  ?Date Value Ref Range  Status  ?01/10/2021   Final  ? - Negative for intraepithelial lesion or malignancy (NILM)  ?  ? ?Review of Systems:  ?Pertinent items noted in HPI and remainder of comprehensive ROS otherwise negative. ? ?Physical Exam:  ?BP 116/67   Pulse 80   Resp 16   Ht 5\' 4"  (1.626 m)   Wt 101 lb (45.8 kg)   LMP 08/16/2021   BMI 17.34 kg/m?  ?CONSTITUTIONAL: Well-developed, well-nourished female in no acute distress.  ?HEENT:  Normocephalic, atraumatic. External right and left ear normal. No scleral icterus.  ?NECK: Normal range of motion, supple, no masses noted on observation ?SKIN: No rash noted. Not diaphoretic. No erythema. No pallor. ?MUSCULOSKELETAL: Normal range of motion. No edema noted. ?NEUROLOGIC: Alert and oriented to person, place, and time. Normal muscle tone coordination. No cranial nerve deficit noted. ?PSYCHIATRIC: Normal mood and affect. Normal behavior. Normal judgment and thought content. ? ?CARDIOVASCULAR: Normal heart rate noted ?RESPIRATORY: Effort and breath sounds normal, no problems with respiration noted ?ABDOMEN: No masses noted. No other overt distention noted.   ? ?PELVIC: Normal appearing external genitalia; normal urethral meatus; normal appearing vaginal mucosa and cervix.  No abnormal discharge noted.  Normal uterine size, no other palpable masses, no uterine or adnexal tenderness. Performed in the presence of a chaperone ? ?Labs and Imaging ?No results found for this or any previous visit (from the past 168 hour(s)). ?No results found.  ?Assessment and Plan:  ? 1.  Vaginal discharge ?- No overt ectropion ?- Will check ureaplasma and mycoplasma ?- Ucx today - will send bactrim that she can take if positive ?- Reviewed we can do empiric abx with doxycycline or can do cryo but reviewed this also could be related to the hormonal imbalance from whatever is impacting her testosterone level so I feel it would be best to address this first if all else is negative  ?-     Mycoplasma / ureaplasma  culture ? ?Urinary tract infection symptoms ?-     Urine Culture ? ? ? ?Routine preventative health maintenance measures emphasized. ?Please refer to After Visit Summary for other counseling recommendations.  ? ?Return if symptoms worsen or fail to improve. ? ?Milas Hock, MD, FACOG ?Obstetrician Heritage manager, Faculty Practice ?Center for [employer], [facility] ? ? ? ? ? ?

## 2021-08-27 LAB — URINE CULTURE
MICRO NUMBER:: 13234611
Result:: NO GROWTH
SPECIMEN QUALITY:: ADEQUATE

## 2021-08-31 ENCOUNTER — Ambulatory Visit: Payer: BC Managed Care – PPO | Attending: Student in an Organized Health Care Education/Training Program

## 2021-08-31 DIAGNOSIS — R262 Difficulty in walking, not elsewhere classified: Secondary | ICD-10-CM | POA: Diagnosis not present

## 2021-08-31 DIAGNOSIS — M6281 Muscle weakness (generalized): Secondary | ICD-10-CM | POA: Diagnosis not present

## 2021-08-31 DIAGNOSIS — M25551 Pain in right hip: Secondary | ICD-10-CM | POA: Insufficient documentation

## 2021-08-31 DIAGNOSIS — M24159 Other articular cartilage disorders, unspecified hip: Secondary | ICD-10-CM | POA: Insufficient documentation

## 2021-08-31 DIAGNOSIS — M357 Hypermobility syndrome: Secondary | ICD-10-CM | POA: Insufficient documentation

## 2021-08-31 LAB — TIQ-NTM

## 2021-08-31 LAB — MYCOPLASMA / UREAPLASMA CULTURE

## 2021-08-31 NOTE — Therapy (Signed)
Physical Therapy Treatment ? ?Patient Details  ?Name: Brianna [name] ?MRN: [id number] ?Date of Birth: 10-Feb-1996 ?Referring Provider (PT): Dr. [name] ? ? ?Encounter Date: 08/31/2021 ? ? PT End of Session - 08/31/21 1749   ? ? Visit Number 25   ? Number of Visits 29   ? Date for PT Re-Evaluation 09/07/21   ? Authorization Type BCBS   ? PT Start Time 1749   ? PT Stop Time 1830   ? PT Time Calculation (min) 41 min   ? Activity Tolerance Patient tolerated treatment well   ? Behavior During Therapy [facility] for tasks assessed/performed   ? ?  ?  ? ?  ? ? ?Past Medical History:  ?Diagnosis Date  ? Acute recurrent pansinusitis 10/21/2019  ? ADHD (attention deficit hyperactivity disorder)   ? inattentive  ? Anemia   ? Anxiety   ? Asthma   ? Bronchitis   ? Cat allergy due to both airborne and skin contact 05/01/2016  ? Chronic sore throat 05/01/2016  ? COVID 2020  ? Depression   ? Fibromyalgia   ? Gastroparesis   ? GERD (gastroesophageal reflux disease)   ? Hypermobile Ehlers-Danlos syndrome   ? Irregular heart rate   ? Keratosis pilaris   ? Migraine   ? Seasonal allergic rhinitis 03/11/2014  ? ? ?Past Surgical History:  ?Procedure Laterality Date  ? ESOPHAGOGASTRODUODENOSCOPY ENDOSCOPY    ? 03/2020  ? JOINT REPLACEMENT    ? LABRAL REPAIR Right 03/02/2021  ? Procedure: RIGHT HIP ARTHROSCOPY WITH LABRAL REPAIR AND PINCE DEBRIDEMENT;  Surgeon: [name], MD;  Location: [facility];  Service: Orthopedics;  Laterality: Right;  ? SMART PILL PROCEDURE    ? 12/2020  ? TYMPANOSTOMY TUBE PLACEMENT Bilateral   ? WISDOM TOOTH EXTRACTION    ? ? ?There were no vitals filed for this visit. ? ? Subjective Assessment - 08/31/21 1752   ? ? Subjective I've been in significant pain lately, it gradually increases throughout the day.   ? Pertinent History gastroparesis I dropped 50#;  now holding 105# but considered 25# underweight (working with nutritionist)   ? Limitations Walking;Standing;Lifting   ? How long can you walk comfortably? <  1/2 mile;  walk a lot at work   ? Patient Stated Goals reduce pelvic floor pain and increase strength. learn techniques that can help;  I would be doing outdoor activities (hiking, climbing);  I do yoga but stopped recently;   I would like to work in the medical field (I'd like to be a PA)   ? Currently in Pain? Yes   ? Pain Score 5    ? Pain Location Hip   ? Pain Orientation Right   ? Pain Descriptors / Indicators Dull;Aching   ? Pain Type Chronic pain   ? Pain Onset More than a month ago   ? ?  ?  ? ?  ? ? ? ? ? ? ?[facility] Adult PT Treatment:                                                DATE: 08/31/2021 ?Therapeutic Exercise: ?Elliptical ramp 15, level 1 x 8 minutes  ?Lateral band walks 4 sets d/b at counter; green band at shins  ?SLS on airex with hip IR/ER x 10 bilateral ?Cybex hip abduction 17.5 2x10 BIL ?Cybex hip extension 17.5  2x10 BIL ?Cybex hip flexion 17.5 x15 2x10 BIL ?Monster walks with GTB around ankles forwards/backwards 3 laps ?SLS on airex palloff press GTB x10 each  ? ? ?[facility] Adult PT Treatment:                                                DATE: 08/11/2021 ?Therapeutic Exercise: ?Elliptical ramp 15, level 1 x 8 minutes  ?Lateral band walks 3 sets d/b at counter; green band at shins  ?SLS on airex with hip IR/ER x 10 bilateral ?Cybex hip abduction 17.5 x15 BIL ?Cybex hip extension 17.5 x15 BIL ?Cybex hip flexion 17.5 x15 BIL ?Monster walks with BlueTB around thighs forwards/backwards 3 laps ?Small lateral steps with BlueTB in athletic stance x 3 laps ?SLS on airex palloff press GTB x10 each  ? ? ? ?[facility] Adult PT Treatment:                                                DATE: 08/02/21 ?Therapeutic Exercise: ?Elliptical ramp 10, level 1 x 5 minutes  ?Lateral band walks 3 sets d/b at counter; green band at shins  ?SLS on airex with hip IR/ER 2 x 10 bilateral ?Kneeling hip flexor stretch attempted (discontinued due to knee pain) ?Supine hip flexor stretch RLE x 60 seconds  ?Leg press with green band  around thighs 2 x 10 @ 40 lbs  ?Verbal update to HEP to include lateral band walks ?Manual:  ?- STM to Rt hip flexor, quads, IT band, glute med. (Biofreeze utilized) [facility] ?[address] ?[place], [place], [address] ? ? ? ? ? ? ? PT Short Term Goals - 07/13/21 1649   ? ?  ? PT SHORT TERM GOAL #1  ? Title Pt will be I with initial HEP for Rt hip ROM, strength in Rt LE   ? Status Achieved   ?  ? PT SHORT TERM GOAL #2  ? Title Pt will be able to complete FOTO and understand results   ? Status Deferred   ?  ? PT SHORT TERM GOAL #3  ? Title Pt will be able to walk without crutches in her home and min increase in pain   ? Status Achieved   ?  ? PT SHORT TERM GOAL #4  ? Title Pt will be able to return to her apartment if tolerating gait and stairs with or without crutches.   ? Status Achieved   ? ?  ?  ? ?  ? ? ? ? PT Long Term Goals - 07/13/21 1445   ? ?  ? PT LONG TERM GOAL #1  ? Title Patient is independent with advanced HEP   ? Status On-going   ? Target Date 09/07/21   ?  ? PT LONG TERM GOAL #2  ? Title Pt will be able to score FOTO TBA   ? Baseline not done   ? Time 8   ? Period Weeks   ? Status Deferred   ? Target Date 09/07/21   ?  ? PT LONG TERM GOAL #3  ? Title Pt will be able to lead with her Rt LE when walking up stairs in her home 50%  of the time with pain minimal   ? Status Achieved   ?  ? PT LONG TERM GOAL #4  ? Title Pt will be able to walk with full stride and no increase in pain for 1000 feet with no device   ? Status Achieved   ?  ? PT LONG TERM GOAL #5  ? Title Pt will be able to demo strength in Rt hip to 4/5 in all planes   ? Baseline 4+/5   ? Time 8   ? Period Weeks   ? Status Achieved   ? Target Date 09/07/21   ?  ? PT LONG TERM GOAL #6  ? Title Pt will be able to sit on the floor cross legged and get up without difficulty   ? Baseline tight in Rt hip 40 deg , painful   ? Time 8   ? Period Weeks   ? Status On-going   ? Target Date 09/07/21   ?   ? PT LONG TERM GOAL #7  ? Title Pt will be able to participate in community yoga class with modifications   ? Baseline not done   ? Time 8   ? Period Weeks   ? Status On-going   ? Target Date 09/07/21   ?  ? PT LONG TERM GOAL #8  ? Title Pt will work a full day with pain no more than 2/10 most of the time   ? Baseline 4/10 or more   ? Time 8   ? Period Weeks   ? Status New   ? Target Date 09/07/21   ? ?  ?  ? ?  ? ? ? ? ? ? ? ? Plan - 08/31/21 1754   ? ? Clinical Impression Statement Patient presents to PT with moderate muscle soreness and pain in her R hip. She relates this pain to pre-surgery levels and is worried that she may have reinjured. Session today focused on R hip strengthening within her pain tolerance. Patient will continue to benefit from PT to address remaining deficits and improve functional ability.   ? Personal Factors and Comorbidities Time since onset of injury/illness/exacerbation;Finances;Sex;Profession;Past/Current Experience   ? Examination-Activity Limitations Locomotion Level;Toileting;Stand;Lift;Dressing;Squat;Stairs   ? Examination-Participation Restrictions Occupation;Community Activity;Interpersonal Relationship;Shop   ? Stability/Clinical Decision Making Stable/Uncomplicated   ? Clinical Decision Making Low   ? Rehab Potential Excellent   ? PT Frequency 1x / week   ? PT Duration 6 weeks   ? PT Treatment/Interventions ADLs/Self Care Home Management;Biofeedback;Cryotherapy;Electrical Stimulation;Iontophoresis 4mg /ml Dexamethasone;Moist Heat;Ultrasound;Neuromuscular re-education;Therapeutic exercise;Therapeutic activities;Patient/family education;Manual techniques;Dry needling;Taping;Spinal Manipulations;Joint Manipulations;Aquatic Therapy;Passive range of motion   ? PT Next Visit Plan closed chain strength as tolerated, step ups, etc manual Rt thigh if needed   ? PT Home Exercise Plan Access Code: [email or web address]   ? Consulted and Agree with Plan of Care Patient   ? ?  ?  ? ?  ? ? ?Patient  will benefit from skilled therapeutic intervention in order to improve the following deficits and impairments:  Decreased coordination, Increased fascial restricitons, Decreased endurance, Decreased activity t

## 2021-09-05 DIAGNOSIS — F32A Depression, unspecified: Secondary | ICD-10-CM | POA: Diagnosis not present

## 2021-09-05 DIAGNOSIS — F411 Generalized anxiety disorder: Secondary | ICD-10-CM | POA: Diagnosis not present

## 2021-09-06 ENCOUNTER — Ambulatory Visit (INDEPENDENT_AMBULATORY_CARE_PROVIDER_SITE_OTHER): Payer: BC Managed Care – PPO | Admitting: Family Medicine

## 2021-09-06 DIAGNOSIS — K3184 Gastroparesis: Secondary | ICD-10-CM | POA: Diagnosis not present

## 2021-09-06 NOTE — Progress Notes (Signed)
Telehealth Encounter  ?I connected with Brianna [name] (MRN [id number]) on 09/06/2021 by MyChart video-enabled, HIPAA-compliant telemedicine application, verified that I was speaking with the correct person using two identifiers, and that the patient was in a private environment conducive to confidentiality.  The patient agreed to proceed. ?PCP Brianna Hancock, MD, [facility] ?Counselor Brianna [name], MSW, LCSW at [employer] in [place] ? ?Persons participating in visit were patient and provider (registered dietitian) Brianna Darner, PhD, RD, LDN, CEDRD.  Provider was located at [facility] during this telehealth encounter; patient was at home. ? ?Appt start time: 1630 end time: 1730 (1 hour)  ? ?Reason for telehealth visit: Referred by Brianna Sorrow, PhD for Medical Nutrition Therapy related to IBS and gastroparesis, as well as food anxiety, and difficulty eating.  She wants to reverse the weight loss related to the above.  She weighed 116 lb in March 2022. ? ?Relevant history/background: Brianna [name] has a history of disordered eating, which has fluctuated between restriction and bingeing as early as elementary school. GI issues started at around age 51, and got especially bad in 2020. Gastroparesis was diagnosed June 2022, with no identified cause.  Brianna [name] suspects vagus nerve damage possibly due to COVID she got in fall 2020.  Her only COVID symptoms were GI-related.  In high school, Brianna [name] self-injured, which she did again in 2021, as she tried to cope with stress.  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   ?Brianna [name] will be seeing GI RD Brianna [name] at the [facility] at [facility] on April 28.  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 ? ?Assessment: Brianna [name]'s GI pain has worsened, and she is not sure if this is related to a bit more fat and fiber intake.  She has tried broccoli and cabbage each a couple times a week (some of the foods suggested at last appt for binding bile acid), and is getting a couple more tbsp of oil per week.  She is convinced that she has a type of malabsorption, having had chronic, itchy skin and GI problems for years.  Counselor Brianna [name] cancelled Brianna [name]'s appt in March, and has not rescheduled.  Brianna [name] at a loss as to how to proceed with this.  She felt appts with Brianna [name] had been helpful, and that she needs some source of support as she struggles with impact of childhood trauma.  She has not answered the "decoding questions" suggested at her last MNT appt, mostly related to time constraints (work is followed by homework each day).  ?Brianna [name] was distressed to get feedback from psychological testing done through [facility] ([place] office) that in addn to major depressive D/O and anxiety, she was diagnosed with borderline personality D/O.   ?Weight: Was alarmed last week to see wt of 93 lb on friend's home scale, down from 102 in March.  Height is 64". ?Usual eating pattern: 3 small meals and 2-3 snacks per day. ?Usual physical activity: minimal PT exercises; PT appt every 10 days or so.   ?Sleep: Tries to get to bed ~11 PM to 6 AM, but still wakes frequently.  Still taking Topomax, but still having migraine H/A's daily, and still having night  sweats.  ?24-hr recall:  ?(Up at 6 AM) ?B (8 AM)-  12 oz shake: 1 c f-f yogurt, milk, banana, [place],  ?Snk ( AM)-  --- ?L (12 PM)-  1 [place], 2 GF bread, lite mayo, mustard, 1 c l-f Grk yogurt ?Snk (3 PM)-  GF banana-zucchini hi-pro muffins ?D (6 PM)-  1/2 c rice, 1/2 c egg whites, soy sauce ?Snk (8 PM)-  1/2 c rice, 6  small l-f, chx  dumplings ?Snk (10:30)-  1 c salty pretzels, marshmallows, honey ?12:30 AM - Bad cramps, upper GI; took Zofran Fri and Sat ?Typical day? No.  Not sure.   ? ?Previous estimated kcal intakes: ?06/23/21:  1300 ?03/28/21: 1925 ?02/21/21:  1880  ?01/25/21:      ?? ?12/28/20:  1160  ?11/29/20:  2230  ?11/08/20:  1170 ? ?Intervention: Provided some dietary recommendations as well as "Decoding Behavior" Qs to address food anxiety.  Provided Feelings and Needs lists.   ? ?For recommendations and goals, see Patient Instructions.   ? ?Follow-up: Remote appt TBD. ? ?Fuquan Wilson,JEANNIE ?  ?

## 2021-09-06 NOTE — Patient Instructions (Signed)
-   Continue with small, frequent meals and snacks low in fat, and aim for ~60 oz of water per day (mostly between meals).    ?  ?- When you see the RD at [facility] for [facility], ask her:  ?- To sign a release of info form if you'd like to have me talk with her.  (Ask her to fax the form to  [phone], attn Dr. Jenne Campus).  ?- If she would recommend you try taking digestive enzymes.   ? ?Healing your relationship with food will include understanding what drives your food choices, e.g., why do you turn to food when feeling negative emotions? ?- Set aside 10 minutes each night to answer these "decoding" questions: ?   1. What am I feeling right now? ?   2. What do I want to feel? ?   3. What do I truly need right now? ?Use the Feelings and Needs lists, and WRITE your answers.   ?Reminder: You are looking for feelings, not thoughts.   ?  ?- Follow-up: Let me know if you want to schedule a nutrition appt sometime after you see the GI RD.  ?  ?

## 2021-09-12 ENCOUNTER — Other Ambulatory Visit: Payer: Self-pay | Admitting: Internal Medicine

## 2021-09-12 DIAGNOSIS — F3341 Major depressive disorder, recurrent, in partial remission: Secondary | ICD-10-CM | POA: Diagnosis not present

## 2021-09-12 DIAGNOSIS — F411 Generalized anxiety disorder: Secondary | ICD-10-CM | POA: Diagnosis not present

## 2021-09-13 DIAGNOSIS — G43719 Chronic migraine without aura, intractable, without status migrainosus: Secondary | ICD-10-CM | POA: Diagnosis not present

## 2021-09-13 DIAGNOSIS — G43909 Migraine, unspecified, not intractable, without status migrainosus: Secondary | ICD-10-CM | POA: Diagnosis not present

## 2021-09-13 DIAGNOSIS — G4486 Cervicogenic headache: Secondary | ICD-10-CM | POA: Diagnosis not present

## 2021-09-14 ENCOUNTER — Ambulatory Visit: Payer: BC Managed Care – PPO

## 2021-09-14 DIAGNOSIS — M6281 Muscle weakness (generalized): Secondary | ICD-10-CM

## 2021-09-14 DIAGNOSIS — R262 Difficulty in walking, not elsewhere classified: Secondary | ICD-10-CM

## 2021-09-14 DIAGNOSIS — M25551 Pain in right hip: Secondary | ICD-10-CM | POA: Diagnosis not present

## 2021-09-14 DIAGNOSIS — M357 Hypermobility syndrome: Secondary | ICD-10-CM | POA: Diagnosis not present

## 2021-09-14 DIAGNOSIS — M24159 Other articular cartilage disorders, unspecified hip: Secondary | ICD-10-CM

## 2021-09-14 NOTE — Therapy (Signed)
[place] ?[facility] ?[address] ?[place], [place], [address] ?Phone: [phone]   Fax:  [phone] ? ?Physical Therapy Treatment/ DISCHARGE SUMMARY ? ?Patient Details  ?Name: Brianna [name] ?MRN: [id number] ?Date of Birth: 1996-05-19 ?Referring Provider (PT): Dr. Vanetta Mulders ? ? ?Encounter Date: 09/14/2021 ? ? PT End of Session - 09/14/21 1855   ? ? Visit Number 26   ? Number of Visits 29   ? Date for PT Re-Evaluation 09/14/21   ? Authorization Type BCBS   ? PT Start Time 4193   ? PT Stop Time 7902   ? PT Time Calculation (min) 41 min   ? Activity Tolerance Patient tolerated treatment well   ? Behavior During Therapy [facility] for tasks assessed/performed   ? ?  ?  ? ?  ? ? ?Past Medical History:  ?Diagnosis Date  ? Acute recurrent pansinusitis 10/21/2019  ? ADHD (attention deficit hyperactivity disorder)   ? inattentive  ? Anemia   ? Anxiety   ? Asthma   ? Bronchitis   ? Cat allergy due to both airborne and skin contact 05/01/2016  ? Chronic sore throat 05/01/2016  ? COVID 2020  ? Depression   ? Fibromyalgia   ? Gastroparesis   ? GERD (gastroesophageal reflux disease)   ? Hypermobile Ehlers-Danlos syndrome   ? Irregular heart rate   ? Keratosis pilaris   ? Migraine   ? Seasonal allergic rhinitis 03/11/2014  ? ? ?Past Surgical History:  ?Procedure Laterality Date  ? ESOPHAGOGASTRODUODENOSCOPY ENDOSCOPY    ? 03/2020  ? JOINT REPLACEMENT    ? LABRAL REPAIR Right 03/02/2021  ? Procedure: RIGHT HIP ARTHROSCOPY WITH LABRAL REPAIR AND PINCE DEBRIDEMENT;  Surgeon: Vanetta Mulders, MD;  Location: [place];  Service: Orthopedics;  Laterality: Right;  ? SMART PILL PROCEDURE    ? 12/2020  ? TYMPANOSTOMY TUBE PLACEMENT Bilateral   ? WISDOM TOOTH EXTRACTION    ? ? ?There were no vitals filed for this visit. ? ? Subjective Assessment - 09/14/21 1754   ? ? Subjective Pt report 4/10 pain today, although she reports her Rt hip pain fluctuates to the point where it feels like pre-surgery  pain. Increased walking > a couple of hours, as well as stress can exacerbate her pain to this level.  She reports poor chronic sleep pattern. She also reports eating and regular screen time prior to falling asleep.   ? Pertinent History gastroparesis I dropped 50#;  now holding 105# but considered 25# underweight (working with nutritionist)   ? Limitations Walking;Standing;Lifting   ? Currently in Pain? Yes   ? Pain Score 4    ? Pain Location Hip   ? Pain Orientation Right   ? Pain Descriptors / Indicators Dull;Aching   ? Pain Type Chronic pain   ? Pain Onset More than a month ago   ? ?  ?  ? ?  ? ? ? ? ? [facility] PT Assessment - 09/14/21 0001   ? ?  ? AROM  ? Right Hip External Rotation  42   ? Right Hip Internal Rotation  33   ? Left Hip External Rotation  42   ? Left Hip Internal Rotation  44   ?  ? PROM  ? Right Hip External Rotation  60   ? Left Hip External Rotation  55   ?  ? Strength  ? Right Hip Flexion 4+/5   ? Right Hip Extension 5/5   ? Right Hip ABduction  5/5   ? ?  ?  ? ?  ? ? ? ? ? ? ?[facility] Adult PT Treatment:                                                DATE: 09/14/2021 ?Therapeutic Exercise: ?SLS Pallof press on Airex pad with 3# cable 2x10 each side on Rt ?Side knee plank with hip abduction 2x10 BIL ?Manual Therapy: ?N/A ?Neuromuscular re-ed: ?N/A ?Therapeutic Activity: ?Re-assessment of objective measures with pt education on progress and importance of HEP adherence ?Sleep modification education ?Stress management education ?Updated HEP  ?Modalities: ?N/A ?Self Care: ?N/A ? ? ? ? ? ? ? ? ? ? ? ? ? ? ? ? ? ? ? ? ? PT Short Term Goals - 07/13/21 1649   ? ?  ? PT SHORT TERM GOAL #1  ? Title Pt will be I with initial HEP for Rt hip ROM, strength in Rt LE   ? Status Achieved   ?  ? PT SHORT TERM GOAL #2  ? Title Pt will be able to complete FOTO and understand results   ? Status Deferred   ?  ? PT SHORT TERM GOAL #3  ? Title Pt will be able to walk without crutches in her home and min increase in pain   ?  Status Achieved   ?  ? PT SHORT TERM GOAL #4  ? Title Pt will be able to return to her apartment if tolerating gait and stairs with or without crutches.   ? Status Achieved   ? ?  ?  ? ?  ? ? ? ? PT Long Term Goals - 09/14/21 1820   ? ?  ? PT LONG TERM GOAL #1  ? Title Patient is independent with advanced HEP   ? Baseline Pt reports non-adherence to her HEP   ? Status On-going   ? Target Date 09/07/21   ?  ? PT LONG TERM GOAL #2  ? Title Pt will be able to score FOTO TBA   ? Baseline not done   ? Time 8   ? Period Weeks   ? Status Deferred   ? Target Date 09/07/21   ?  ? PT LONG TERM GOAL #3  ? Title Pt will be able to lead with her Rt LE when walking up stairs in her home 50% of the time with pain minimal   ? Status Achieved   ?  ? PT LONG TERM GOAL #4  ? Title Pt will be able to walk with full stride and no increase in pain for 1000 feet with no device   ? Status Achieved   ?  ? PT LONG TERM GOAL #5  ? Title Pt will be able to demo strength in Rt hip to 4/5 in all planes   ? Baseline 4+/5   ? Time 8   ? Period Weeks   ? Status Achieved   ? Target Date 09/07/21   ?  ? PT LONG TERM GOAL #6  ? Title Pt will be able to sit on the floor cross legged and get up without difficulty   ? Baseline tight in Rt hip 40 deg , painful; 09/14/2021: continued limitation   ? Time 8   ? Period Weeks   ? Status Not Met   ? Target Date  09/07/21   ?  ? PT LONG TERM GOAL #7  ? Title Pt will be able to participate in community yoga class with modifications   ? Baseline not done   ? Time 8   ? Period Weeks   ? Status Not Met   ? Target Date 09/07/21   ?  ? PT LONG TERM GOAL #8  ? Title Pt will work a full day with pain no more than 2/10 most of the time   ? Baseline 4/10 or more   ? Time 8   ? Period Weeks   ? Status Not Met   ? Target Date 09/07/21   ? ?  ?  ? ?  ? ? ? ? ? ? ? ? Plan - 09/14/21 1849   ? ? Clinical Impression Statement Pt continues to have moderate-to-severe functional limitation due to fluctuating Rt hip pain. Upon  re-assessment, the pt has plateaued in her progress with hip ROM, pain levels, and functional walking ability. Due to lack of progress, the pt is discharged from PT at this time. Sleep modification and stress management techniques were discussed at length with pt, as well as importance of HEP adherence moving forward in achieving her remaining rehab goals independently. Her HEP was updated, and she agrees to this plan. She has an orthopedic appointment next month where she plans on getting a new PT referral for cervical pain as this has become a more limiting issue than her hip.   ? PT Next Visit Plan Pt is discharged from PT at this time.   ? PT Home Exercise Plan Access Code: [email or web address]   ? Consulted and Agree with Plan of Care Patient   ? ?  ?  ? ?  ? ? ?Patient will benefit from skilled therapeutic intervention in order to improve the following deficits and impairments:    ? ?Visit Diagnosis: ?Pain in right hip ? ?Difficulty in walking, not elsewhere classified ? ?Muscle weakness (generalized) ? ?Hypermobility syndrome ? ?Labral tear of hip, degenerative ? ? ? ? ?Problem List ?Patient Active Problem List  ? Diagnosis Date Noted  ? Cervical adenopathy 06/10/2021  ? Chronic pruritus 06/10/2021  ? Exercise-induced asthma 06/09/2021  ? Chronic venous insufficiency 06/03/2021  ? Orthostatic lightheadedness 06/03/2021  ? Purple toe syndrome of both feet ([place]) 06/03/2021  ? Autonomic dysfunction 06/03/2021  ? Labral tear of hip, degenerative 04/13/2021  ? Migraine headache 03/01/2021  ? Headache 11/30/2020  ? Positive ANA (antinuclear antibody) 11/15/2020  ? Fibromyalgia 11/02/2020  ? Hair loss 11/02/2020  ? Muscle spasticity 10/22/2020  ? Hypermobile Ehlers-Danlos syndrome 10/22/2020  ? Paresthesia of bilateral legs 10/06/2020  ? Myalgia 10/06/2020  ? Chronic pelvic pain in female 10/06/2020  ? Chronic abdominal pain 10/06/2020  ? Irritable colon 08/02/2020  ? Pelvic floor dysfunction 08/02/2020  ? Irritant dermatitis  08/02/2020  ? Gastroesophageal reflux disease 06/28/2020  ? Severe recurrent major depression without psychotic features ([place]) 06/28/2020  ? Polyarthralgia 06/28/2020  ? 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

## 2021-09-16 DIAGNOSIS — Z713 Dietary counseling and surveillance: Secondary | ICD-10-CM | POA: Diagnosis not present

## 2021-09-16 DIAGNOSIS — K3184 Gastroparesis: Secondary | ICD-10-CM | POA: Diagnosis not present

## 2021-09-16 DIAGNOSIS — R634 Abnormal weight loss: Secondary | ICD-10-CM | POA: Diagnosis not present

## 2021-09-16 DIAGNOSIS — R110 Nausea: Secondary | ICD-10-CM | POA: Diagnosis not present

## 2021-09-16 DIAGNOSIS — Z681 Body mass index (BMI) 19 or less, adult: Secondary | ICD-10-CM | POA: Diagnosis not present

## 2021-09-19 ENCOUNTER — Telehealth: Payer: Self-pay | Admitting: Gastroenterology

## 2021-09-19 DIAGNOSIS — F3341 Major depressive disorder, recurrent, in partial remission: Secondary | ICD-10-CM | POA: Diagnosis not present

## 2021-09-19 DIAGNOSIS — F411 Generalized anxiety disorder: Secondary | ICD-10-CM | POA: Diagnosis not present

## 2021-09-19 NOTE — Telephone Encounter (Signed)
Hi Dr. Lavon Paganini, ? ?This patient is preferring you as GI provider ? ?Received a call from patient wishing to transfer her care from [facility] to you. (Patient was persistent I ask you again). Per patient, GI provider will be retiring soon and feels like that practice is not a good fit for personal reasons. Patient also states she has mismanaged symptoms and is looking for a good treatment plan. Last colonoscopy January 2022.  ? ?I advised patient you could not accommodate her at this time and to try again 6-12 months.   ? ? ?Thank you. ? ? ?

## 2021-09-20 NOTE — Telephone Encounter (Signed)
Agree. ? We appreciate the interest in our practice, however at this time due to high demand from patients without established GI providers we cannot accommodate this transfer.  Ability to accommodate future transfer requests may change over time and the patient can contact us again in 6-12 months if still interested in being seen at [facility].      ?

## 2021-09-21 NOTE — Telephone Encounter (Signed)
Called patient to discuss message below. Patient requested to speak with our office manager. Office manager aware and will call patient. End of call.  ?

## 2021-09-26 DIAGNOSIS — H938X3 Other specified disorders of ear, bilateral: Secondary | ICD-10-CM | POA: Diagnosis not present

## 2021-09-26 DIAGNOSIS — F3341 Major depressive disorder, recurrent, in partial remission: Secondary | ICD-10-CM | POA: Diagnosis not present

## 2021-09-26 DIAGNOSIS — F411 Generalized anxiety disorder: Secondary | ICD-10-CM | POA: Diagnosis not present

## 2021-09-27 DIAGNOSIS — R140 Abdominal distension (gaseous): Secondary | ICD-10-CM | POA: Diagnosis not present

## 2021-09-27 DIAGNOSIS — R110 Nausea: Secondary | ICD-10-CM | POA: Diagnosis not present

## 2021-09-27 DIAGNOSIS — K9049 Malabsorption due to intolerance, not elsewhere classified: Secondary | ICD-10-CM | POA: Diagnosis not present

## 2021-09-27 DIAGNOSIS — R634 Abnormal weight loss: Secondary | ICD-10-CM | POA: Diagnosis not present

## 2021-09-27 DIAGNOSIS — R142 Eructation: Secondary | ICD-10-CM | POA: Diagnosis not present

## 2021-10-03 DIAGNOSIS — F411 Generalized anxiety disorder: Secondary | ICD-10-CM | POA: Diagnosis not present

## 2021-10-03 DIAGNOSIS — F3341 Major depressive disorder, recurrent, in partial remission: Secondary | ICD-10-CM | POA: Diagnosis not present

## 2021-10-04 DIAGNOSIS — K136 Irritative hyperplasia of oral mucosa: Secondary | ICD-10-CM | POA: Diagnosis not present

## 2021-10-05 DIAGNOSIS — E46 Unspecified protein-calorie malnutrition: Secondary | ICD-10-CM | POA: Diagnosis not present

## 2021-10-05 DIAGNOSIS — K3184 Gastroparesis: Secondary | ICD-10-CM | POA: Diagnosis not present

## 2021-10-05 DIAGNOSIS — H5111 Convergence insufficiency: Secondary | ICD-10-CM | POA: Diagnosis not present

## 2021-10-05 DIAGNOSIS — R194 Change in bowel habit: Secondary | ICD-10-CM | POA: Diagnosis not present

## 2021-10-05 DIAGNOSIS — R110 Nausea: Secondary | ICD-10-CM | POA: Diagnosis not present

## 2021-10-05 DIAGNOSIS — H5203 Hypermetropia, bilateral: Secondary | ICD-10-CM | POA: Diagnosis not present

## 2021-10-07 DIAGNOSIS — K3184 Gastroparesis: Secondary | ICD-10-CM | POA: Diagnosis not present

## 2021-10-07 DIAGNOSIS — R790 Abnormal level of blood mineral: Secondary | ICD-10-CM | POA: Diagnosis not present

## 2021-10-07 DIAGNOSIS — G43709 Chronic migraine without aura, not intractable, without status migrainosus: Secondary | ICD-10-CM | POA: Diagnosis not present

## 2021-10-07 DIAGNOSIS — G4486 Cervicogenic headache: Secondary | ICD-10-CM | POA: Diagnosis not present

## 2021-10-10 DIAGNOSIS — F411 Generalized anxiety disorder: Secondary | ICD-10-CM | POA: Diagnosis not present

## 2021-10-10 DIAGNOSIS — F3341 Major depressive disorder, recurrent, in partial remission: Secondary | ICD-10-CM | POA: Diagnosis not present

## 2021-10-10 DIAGNOSIS — G894 Chronic pain syndrome: Secondary | ICD-10-CM | POA: Diagnosis not present

## 2021-10-12 DIAGNOSIS — L299 Pruritus, unspecified: Secondary | ICD-10-CM | POA: Diagnosis not present

## 2021-10-12 DIAGNOSIS — Q796 Ehlers-Danlos syndrome, unspecified: Secondary | ICD-10-CM | POA: Diagnosis not present

## 2021-10-12 DIAGNOSIS — L659 Nonscarring hair loss, unspecified: Secondary | ICD-10-CM | POA: Diagnosis not present

## 2021-10-14 DIAGNOSIS — L659 Nonscarring hair loss, unspecified: Secondary | ICD-10-CM | POA: Diagnosis not present

## 2021-10-17 DIAGNOSIS — R7989 Other specified abnormal findings of blood chemistry: Secondary | ICD-10-CM | POA: Diagnosis not present

## 2021-10-20 ENCOUNTER — Encounter (HOSPITAL_BASED_OUTPATIENT_CLINIC_OR_DEPARTMENT_OTHER): Payer: Self-pay | Admitting: Orthopaedic Surgery

## 2021-10-20 ENCOUNTER — Ambulatory Visit (INDEPENDENT_AMBULATORY_CARE_PROVIDER_SITE_OTHER): Payer: BC Managed Care – PPO | Admitting: Orthopaedic Surgery

## 2021-10-20 ENCOUNTER — Ambulatory Visit (HOSPITAL_BASED_OUTPATIENT_CLINIC_OR_DEPARTMENT_OTHER): Payer: BC Managed Care – PPO | Admitting: Orthopaedic Surgery

## 2021-10-20 DIAGNOSIS — M25551 Pain in right hip: Secondary | ICD-10-CM

## 2021-10-20 DIAGNOSIS — S73191D Other sprain of right hip, subsequent encounter: Secondary | ICD-10-CM | POA: Diagnosis not present

## 2021-10-20 NOTE — Progress Notes (Signed)
Post Operative Evaluation    Procedure/Date of Surgery: Right hip arthroscopy with labral repair, pincer debridement 03/02/21  Interval History:   10/20/2021: Brianna [name] presents today for follow-up of her right hip.  Overall she does continue to have achiness that is bothersome to her at work or during sleep.  Overall she states that this is not back to where it was at baseline as she is no longer experiencing sharp pain.  At this time she is continue to work on her mental health. PMH/PSH/Family History/Social History/Meds/Allergies:    Past Medical History:  Diagnosis Date   Acute recurrent pansinusitis 10/21/2019   ADHD (attention deficit hyperactivity disorder)    inattentive   Anemia    Anxiety    Asthma    Bronchitis    Cat allergy due to both airborne and skin contact 05/01/2016   Chronic sore throat 05/01/2016   COVID 2020   Depression    Fibromyalgia    Gastroparesis    GERD (gastroesophageal reflux disease)    Hypermobile Ehlers-Danlos syndrome    Irregular heart rate    Keratosis pilaris    Migraine    Seasonal allergic rhinitis 03/11/2014   Past Surgical History:  Procedure Laterality Date   ESOPHAGOGASTRODUODENOSCOPY ENDOSCOPY     03/2020   JOINT REPLACEMENT     LABRAL REPAIR Right 03/02/2021   Procedure: RIGHT HIP ARTHROSCOPY WITH LABRAL REPAIR AND PINCE DEBRIDEMENT;  Surgeon: [name], MD;  Location: [facility] OR;  Service: Orthopedics;  Laterality: Right;   SMART PILL PROCEDURE     12/2020   TYMPANOSTOMY TUBE PLACEMENT Bilateral    WISDOM TOOTH EXTRACTION     Social History   Socioeconomic History   Marital status: Single    Spouse name: Not on file   Number of children: Not on file   Years of education: Not on file   Highest education level: Not on file  Occupational History   Not on file  Tobacco Use   Smoking status: Former    Types: E-cigarettes, Cigarettes    Quit date: 2021    Years since quitting: 2.4    Smokeless tobacco: Never  Vaping Use   Vaping Use: Former   Substances: Financial trader  Substance and Sexual Activity   Alcohol use: Not Currently   Drug use: Yes    Types: Marijuana    Comment: last use 7 days ago   Sexual activity: Yes    Birth control/protection: None  Other Topics Concern   Not on file  Social History Narrative   Right handed   Social Determinants of Health   Financial Resource Strain: Not on file  Food Insecurity: Not on file  Transportation Needs: Not on file  Physical Activity: Not on file  Stress: Not on file  Social Connections: Not on file   Family History  Problem Relation Age of Onset   Diabetes Mother    Polycystic ovary syndrome Mother    GER disease Father    Heart disease Paternal Uncle    Allergies  Allergen Reactions   Clindamycin Hives, Swelling and Rash   Tretinoin Dermatitis, Itching, Photosensitivity and Swelling   Current Outpatient Medications  Medication Sig Dispense Refill   azelastine (ASTELIN) 0.1 % nasal spray PLACE 2 SPRAYS INTO BOTH NOSTRILS 2 TIMES DAILY AS NEEDED FOR RHINITIS  Cholecalciferol 25 MCG (1000 UT) tablet Take by mouth. (Patient not taking: Reported on 08/15/2021)     Ferrous Sulfate Dried (EQ SLOW-RELEASE IRON) 45 MG TBCR Take 1 tablet by mouth every 3 (three) days.     fluticasone (FLONASE) 50 MCG/ACT nasal spray SPRAY 1 SPRAY INTO BOTH NOSTRILS DAILY. 16 mL 1   hydrOXYzine (VISTARIL) 25 MG capsule Take 25 mg by mouth at bedtime.     loratadine (CLARITIN) 10 MG tablet Take 1 tablet (10 mg total) by mouth daily. 90 tablet 1   Multiple Vitamins-Minerals (ADULT GUMMY PO) Take 2 capsules by mouth daily.     naratriptan (AMERGE) 2.5 MG tablet Take by mouth.     Omega-3 Fatty Acids (FISH OIL PO) Take 700 Units by mouth.     ondansetron (ZOFRAN ODT) 4 MG disintegrating tablet Take 1 tablet (4 mg total) by mouth every 8 (eight) hours as needed for nausea or vomiting. 20 tablet 5   promethazine  (PHENERGAN) 25 MG tablet Take 1 tablet (25 mg total) by mouth every 6 (six) hours as needed for nausea or vomiting. 30 tablet 0   topiramate (TOPAMAX) 50 MG tablet Take 50 mg by mouth 1 day or 1 dose.     No current facility-administered medications for this visit.   No results found.  Review of Systems:   A ROS was performed including pertinent positives and negatives as documented in the HPI.   Musculoskeletal Exam:     Right hip incisions are well healed.  External rotation is to approximately 35 degrees versus 45 on the contralateral side.  Internal rotation is to 30 degrees with minimal pain.   Imaging:    None  I personally reviewed and interpreted the radiographs.   Assessment:   26 year old female status post right hip arthroscopy.  At this time I do believe that she would benefit from a discussion with Dr. Carlis Abbott to see if there are any nonsurgical pain options for her with regard to the right hip.  She is working on cognitive retraining which I believe this would complement quite well.  She will continue to work on a home exercise program I will see her back on an as-needed basis Plan :    -Return to clinic as needed   I personally saw and evaluated the patient, and participated in the management and treatment plan.  [name], MD Attending Physician, Orthopedic Surgery  This document was dictated using Dragon voice recognition software. A reasonable attempt at proof reading has been made to minimize errors.

## 2021-10-24 DIAGNOSIS — F411 Generalized anxiety disorder: Secondary | ICD-10-CM | POA: Diagnosis not present

## 2021-10-24 DIAGNOSIS — F3341 Major depressive disorder, recurrent, in partial remission: Secondary | ICD-10-CM | POA: Diagnosis not present

## 2021-10-24 DIAGNOSIS — G894 Chronic pain syndrome: Secondary | ICD-10-CM | POA: Diagnosis not present

## 2021-10-25 ENCOUNTER — Other Ambulatory Visit: Payer: Self-pay

## 2021-10-25 ENCOUNTER — Ambulatory Visit: Payer: BC Managed Care – PPO | Attending: Student in an Organized Health Care Education/Training Program

## 2021-10-25 DIAGNOSIS — R7989 Other specified abnormal findings of blood chemistry: Secondary | ICD-10-CM | POA: Diagnosis not present

## 2021-10-25 DIAGNOSIS — M25551 Pain in right hip: Secondary | ICD-10-CM | POA: Diagnosis not present

## 2021-10-25 DIAGNOSIS — R262 Difficulty in walking, not elsewhere classified: Secondary | ICD-10-CM | POA: Diagnosis not present

## 2021-10-25 DIAGNOSIS — L659 Nonscarring hair loss, unspecified: Secondary | ICD-10-CM | POA: Diagnosis not present

## 2021-10-25 DIAGNOSIS — M542 Cervicalgia: Secondary | ICD-10-CM | POA: Diagnosis not present

## 2021-10-25 DIAGNOSIS — M6281 Muscle weakness (generalized): Secondary | ICD-10-CM | POA: Diagnosis not present

## 2021-10-25 NOTE — Therapy (Addendum)
OUTPATIENT PHYSICAL THERAPY LOWER EXTREMITY EVALUATION   Patient Name: Brianna [name] MRN: [id number] DOB:1995-05-23, 26 y.o., female Today's Date: 10/25/2021    Past Medical History:  Diagnosis Date   Acute recurrent pansinusitis 10/21/2019   ADHD (attention deficit hyperactivity disorder)    inattentive   Anemia    Anxiety    Asthma    Bronchitis    Cat allergy due to both airborne and skin contact 05/01/2016   Chronic sore throat 05/01/2016   COVID 2020   Depression    Fibromyalgia    Gastroparesis    GERD (gastroesophageal reflux disease)    Hypermobile Ehlers-Danlos syndrome    Irregular heart rate    Keratosis pilaris    Migraine    Seasonal allergic rhinitis 03/11/2014   Past Surgical History:  Procedure Laterality Date   ESOPHAGOGASTRODUODENOSCOPY ENDOSCOPY     03/2020   JOINT REPLACEMENT     LABRAL REPAIR Right 03/02/2021   Procedure: RIGHT HIP ARTHROSCOPY WITH LABRAL REPAIR AND PINCE DEBRIDEMENT;  Surgeon: [name], MD;  Location: [facility] OR;  Service: Orthopedics;  Laterality: Right;   SMART PILL PROCEDURE     12/2020   TYMPANOSTOMY TUBE PLACEMENT Bilateral    WISDOM TOOTH EXTRACTION     Patient Active Problem List   Diagnosis Date Noted   Cervical adenopathy 06/10/2021   Chronic pruritus 06/10/2021   Exercise-induced asthma 06/09/2021   Chronic venous insufficiency 06/03/2021   Orthostatic lightheadedness 06/03/2021   Purple toe syndrome of both feet (HCC) 06/03/2021   Autonomic dysfunction 06/03/2021   Labral tear of hip, degenerative 04/13/2021   Migraine headache 03/01/2021   Headache 11/30/2020   Positive ANA (antinuclear antibody) 11/15/2020   Fibromyalgia 11/02/2020   Hair loss 11/02/2020   Muscle spasticity 10/22/2020   Hypermobile Ehlers-Danlos syndrome 10/22/2020   Paresthesia of bilateral legs 10/06/2020   Myalgia 10/06/2020   Chronic pelvic pain in female 10/06/2020   Chronic abdominal pain 10/06/2020   Irritable colon  08/02/2020   Pelvic floor dysfunction 08/02/2020   Irritant dermatitis 08/02/2020   Gastroesophageal reflux disease 06/28/2020   Severe recurrent major depression without psychotic features (HCC) 06/28/2020   Polyarthralgia 06/28/2020   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 06/28/2020   Tachycardia 06/28/2020   History of COVID-19 06/28/2020   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 deficit 06/28/2020   Recurrent major depressive disorder, in full remission (HCC) 06/28/2020   Atypical chest pain 06/22/2020   Dizziness 06/22/2020   Palpitations 06/14/2020   Anxiety 05/04/2020   Internal and external hemorrhoids without complication 04/29/2020   Nondiabetic gastroparesis 03/03/2020   ETD (Eustachian tube dysfunction), bilateral 10/21/2019   Acne vulgaris 03/11/2014   Mild intermittent asthma without complication 03/11/2014   Seasonal allergies 03/11/2014    PCP: Cathe Mons, MD  REFERRING PROVIDER: [name], MD  REFERRING DIAG: M25.551 (ICD-10-CM) - Pain in right hip  THERAPY DIAG:  No diagnosis found.  Rationale for Evaluation and Treatment Rehabilitation  ONSET DATE: Several years ago  SUBJECTIVE:   SUBJECTIVE STATEMENT: Pt reports primary c/o neck/ upper back pain lasting several years, although this has gotten worse in the past few months. She reports that she has trouble sleeping due to this pain. She reports the pain is achy right below the hairline and can radiate to the upper back. Pt denies any N/T into the UE's or temperature change in her hands. Aggravating factors include BIL cervical rotation, looking down, looking down at computer screen/ phone. Easing factors include IcyHot. Current  pain is 6/10. Worst pain is 8/10. Best pain is 3/10. Pt reports that although her Rt hip has still been hurting, she requests that focus of PT be for her neck.  PERTINENT HISTORY: 03/02/2021: RIGHT HIP ARTHROSCOPY WITH LABRAL REPAIR  AND PINCE DEBRIDEMENT, anxiety, depression, fibromyalgia, asthma, Symptomatic Ehler's-Danlos syndrome  PAIN:  Are you having pain? 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 No  LIVING ENVIRONMENT: Lives with: lives alone Lives in: House/apartment Stairs: Yes: Internal: 15 steps; on right going up, on left going up, and can reach both and External: 2 steps; on right going up, on left going up, and can reach both Has following equipment at home: None  OCCUPATION: Ophthalmic technician, sitting/ standing through the day  PLOF: Independent  PATIENT GOALS Turn head without pain, work with less pain  Screening for Suicide  Answer the following questions with Yes or No and place an "x" beside the action taken.  1. Over the past two weeks, have you felt down, depressed, or hopeless?   Yes  2. Within the past two weeks, have you felt little interest or pleasure in life?  Yes  If YES to either #1 or #2, then ask #3  3. Have you had thoughts that life is not worth living or that you might be       better off dead?   Yes  If answer is NO and suspicion is low, then end   4. Over this past week, have you had any thoughts about hurting or even killing yourself?  No  If NO, then end. Patient in no immediate danger   5. If so, do you believe that you intend to or will harm yourself?       If NO, then end. Patient in no immediate danger   6.  Do you have a plan as to how you would hurt yourself?     7.  Over this past week, have you actually done anything to hurt yourself?    IF YES answers to either #4, #5, #6 or #7, then patient is AT RISK for suicide   Actions Taken  ____   Screening negative; no further action required  __X__  Screening positive; no immediate danger and patient already in treatment with a  mental health provider. Advise patient to speak to their mental health provider.  ____  Screening positive; no immediate danger. Patient advised to contact a mental  health provider for further assessment.   ____  Screening positive; in immediate danger as patient states intention of killing self,  has plan and a sense of imminence. Do not leave alone. Seek permission from  patient to contact a family member to inform them. Direct patient to go to ED.   OBJECTIVE:   DIAGNOSTIC FINDINGS: MR Hip Right with contrast: IMPRESSION: 1. Full-thickness tear of the right anterior superior labrum.  PATIENT SURVEYS:  FOTO Cervical: 59%, predicted 67% in 11 visits; Hip: 58%, predicted 71% in 13 visits  COGNITION:  Overall cognitive status: Within functional limits for tasks assessed     SENSATION: Not tested   POSTURE:  Forward head/ BIL rounded shoulders  PALPATION: TTP with palpable trigger points to BIL cervical paraspinals/ UT/ mid  trap   PASSIVE ACCESSORIES: Hypermobile/ painful CPAs C2-C3, C5-C7 Hypomobile/ painful CPAs C4-C5, T1-T8  CERVICAL ROM:   AROM A/PROM (deg) 10/25/2021  Flexion 40p!  Extension 65p!!  Right lateral flexion 45  Left lateral flexion 45  Right rotation 61p!  Left rotation 68p!   (Blank rows = not tested)  UE ROM:  AROM Right 10/25/2021 Left 10/25/2021  Shoulder flexion WNL WNL  Shoulder abduction WNL WNL   (Blank rows = not tested)  UE MMT:  MMT Right 10/25/2021 Left 10/25/2021  Shoulder flexion 5/5 5/5  Shoulder abduction 5/5 5/5  Cervical flexion 4/5p!  Cervical extension 4/5p!  Cervical side bend 5/5 5/5  Shoulder IR 5/5 5/5  Shoulder ER 5/5 5/5  Mid trap 3/5 3/5  Low trap 3/5 3/5  Latissimus dorsi 3/5 3/5   (Blank rows = not tested)  CERVICAL SPECIAL TESTS:  Cervical distraction: (-) Cervical  compression: (+) Quadrant testing: (+) BIL   FUNCTIONAL TESTS:  DNF endurance test: 15 seconds    TODAY'S TREATMENT: 10/25/2021: Demonstrated and issued HEP   PATIENT EDUCATION:  Education details: Pt educated on underlying pathophysiology behind her pain presentation, POC, prognosis, FOTO, and HEP Person educated: Patient Education method: Explanation, Demonstration, and Handouts Education comprehension: verbalized understanding and returned demonstration   HOME EXERCISE PROGRAM: Access Code: [email or web address] URL: [email or web address] Date: 10/25/2021 Prepared by: Carmelina Dane  Exercises - Seated Cervical Retraction  - 1 x daily - 7 x weekly - 3 sets - 10 reps - 3-sec hold - Standing Shoulder Row with Anchored Resistance  - 1 x daily - 7 x weekly - 3 sets - 10 reps - 3-sec hold - Low trap slides at wall with lift-off  - 1 x daily - 7 x weekly - 3 sets - 10 reps - 3-sec hold - Shoulder Extension with Resistance  - 1 x daily - 7 x weekly - 3 sets - 10 reps - 3-sec hold  ASSESSMENT:  CLINICAL IMPRESSION: Pt arrived 10 minutes late to her eval, which led to a truncated session. Pt is a 26 y.o. F who was seen today for physical therapy evaluation and treatment for chronic posterior cervical/ upper back pain, along with continued Rt hip pain. Due to time constraints, assessment focused primarily on cervical symptoms today, per pt request. Upon assessment, her primary impairments include painful and limited cervical AROM in flexion, extension, and BIL rotation, weak deep neck flexors, weak BIL global parascapular strength, weak and painful cervical flexion and extension MMT, TTP with palpable trigger points to BIL cervical paraspinals/ UT/ mid trap, and painful and limited cervicothoracic passive accessory mobility. Ruling up cervicothoracic facet dysfunction/ mechanical neck pain due to positive quadrant testing, hypomobile and painful cervicothoracic passive accessory  mobility, weak deep neck flexors, weak parascapular musculature, and forward head posture. She will benefit from skilled PT to address her primary impairments and return to her PLOF with less limitation.    OBJECTIVE IMPAIRMENTS Abnormal gait, decreased balance, decreased mobility, difficulty walking, decreased ROM, decreased strength, hypomobility, increased edema, increased muscle spasms, impaired flexibility, improper body mechanics, postural dysfunction, and pain.   ACTIVITY LIMITATIONS carrying, lifting, bending, sitting, standing, squatting, sleeping, stairs, transfers, and locomotion level  PARTICIPATION LIMITATIONS: meal prep, cleaning, laundry, interpersonal relationship, driving, shopping, community activity, occupation, and yard work  PERSONAL FACTORS Past/current experiences, Time since onset of injury/illness/exacerbation, and 3+ comorbidities: See medical hx  are also affecting patient's functional outcome.   REHAB POTENTIAL: Fair Due to time since onset of symptoms,  multiple comorbidities/ treatment areas  CLINICAL DECISION MAKING: Evolving/moderate complexity  EVALUATION COMPLEXITY: Moderate   GOALS: Goals reviewed with patient? Yes  SHORT TERM GOALS: Target date: 11/22/2021  Pt will report understanding and adherence to initial HEP in order to promote independence in the management of primary impairments. Baseline: HEP provided at eval Goal status: INITIAL   LONG TERM GOALS: Target date: 12/20/2021   Pt will achieve a cervical FOTO score of 67% in order to demonstrate improved functional ability as it relates to her primary impairments. Baseline: 59% Goal status: INITIAL  2.  Pt will achieve BIL global parascapular strength of 4+/5 or higher in order to promote improved posture and prophylaxis of future mechanical neck pain Baseline: 3/5 global parascapular strength Goal status: INITIAL  3.  Pt will achieve global cervical AROM WNL and with 0-2/10 pain in order to  get dressed with less limitation. Baseline: See AROM chart Goal status: INITIAL  4.  Pt will report ability to complete a full work day, including looking down at her computer screen with 0-3/10 pain in order to complete her work duties with less limitation. Baseline: Pt has >6/10 pain at the end of her work day Goal status: INITIAL    PLAN: PT FREQUENCY: 1x/week  PT DURATION: 8 weeks  PLANNED INTERVENTIONS: Therapeutic exercises, Therapeutic activity, Neuromuscular re-education, Balance training, Gait training, Patient/Family education, Joint mobilization, Stair training, Aquatic Therapy, Dry Needling, Electrical stimulation, Spinal mobilization, Cryotherapy, Moist heat, Taping, Vasopneumatic device, Traction, Biofeedback, Ionotophoresis 4mg /ml Dexamethasone, Manual therapy, and Re-evaluation  PLAN FOR NEXT SESSION: Progress DNF/ parascapular strength/ manual techniques for pain (no OMT due to EDS)   , PT, DPT 10/25/21 12:48 PM  12/25/21, PT, DPT 10/27/21 9:29 AM

## 2021-10-26 DIAGNOSIS — K3184 Gastroparesis: Secondary | ICD-10-CM | POA: Diagnosis not present

## 2021-10-31 DIAGNOSIS — F411 Generalized anxiety disorder: Secondary | ICD-10-CM | POA: Diagnosis not present

## 2021-10-31 DIAGNOSIS — G894 Chronic pain syndrome: Secondary | ICD-10-CM | POA: Diagnosis not present

## 2021-10-31 DIAGNOSIS — F3341 Major depressive disorder, recurrent, in partial remission: Secondary | ICD-10-CM | POA: Diagnosis not present

## 2021-11-01 DIAGNOSIS — K3184 Gastroparesis: Secondary | ICD-10-CM | POA: Diagnosis not present

## 2021-11-01 DIAGNOSIS — Z9049 Acquired absence of other specified parts of digestive tract: Secondary | ICD-10-CM | POA: Diagnosis not present

## 2021-11-01 DIAGNOSIS — R1900 Intra-abdominal and pelvic swelling, mass and lump, unspecified site: Secondary | ICD-10-CM | POA: Diagnosis not present

## 2021-11-01 DIAGNOSIS — K581 Irritable bowel syndrome with constipation: Secondary | ICD-10-CM | POA: Diagnosis not present

## 2021-11-02 ENCOUNTER — Ambulatory Visit: Payer: BC Managed Care – PPO

## 2021-11-02 DIAGNOSIS — R262 Difficulty in walking, not elsewhere classified: Secondary | ICD-10-CM | POA: Diagnosis not present

## 2021-11-02 DIAGNOSIS — M25551 Pain in right hip: Secondary | ICD-10-CM | POA: Diagnosis not present

## 2021-11-02 DIAGNOSIS — M542 Cervicalgia: Secondary | ICD-10-CM | POA: Diagnosis not present

## 2021-11-02 DIAGNOSIS — M6281 Muscle weakness (generalized): Secondary | ICD-10-CM | POA: Diagnosis not present

## 2021-11-02 NOTE — Therapy (Signed)
OUTPATIENT PHYSICAL THERAPY TREATMENT NOTE   Patient Name: Brianna [name] MRN: [id number] DOB:04/27/1996, 26 y.o., female Today's Date: 11/02/2021  PCP: Cathe Mons, MD REFERRING PROVIDER: [name], MD  END OF SESSION:   PT End of Session - 11/02/21 1742     Visit Number 2    Number of Visits 9    Date for PT Re-Evaluation 12/27/21    Authorization Type BCBS    Authorization Time Period FOTO v6, v10    PT Start Time 1745    PT Stop Time 1830   5 minutes dry needle insertion   PT Time Calculation (min) 45 min    Activity Tolerance Patient tolerated treatment well    Behavior During Therapy [facility] for tasks assessed/performed             Past Medical History:  Diagnosis Date   Acute recurrent pansinusitis 10/21/2019   ADHD (attention deficit hyperactivity disorder)    inattentive   Anemia    Anxiety    Asthma    Bronchitis    Cat allergy due to both airborne and skin contact 05/01/2016   Chronic sore throat 05/01/2016   COVID 2020   Depression    Fibromyalgia    Gastroparesis    GERD (gastroesophageal reflux disease)    Hypermobile Ehlers-Danlos syndrome    Irregular heart rate    Keratosis pilaris    Migraine    Seasonal allergic rhinitis 03/11/2014   Past Surgical History:  Procedure Laterality Date   ESOPHAGOGASTRODUODENOSCOPY ENDOSCOPY     03/2020   JOINT REPLACEMENT     LABRAL REPAIR Right 03/02/2021   Procedure: RIGHT HIP ARTHROSCOPY WITH LABRAL REPAIR AND PINCE DEBRIDEMENT;  Surgeon: [name], MD;  Location: [facility] OR;  Service: Orthopedics;  Laterality: Right;   SMART PILL PROCEDURE     12/2020   TYMPANOSTOMY TUBE PLACEMENT Bilateral    WISDOM TOOTH EXTRACTION     Patient Active Problem List   Diagnosis Date Noted   Cervical adenopathy 06/10/2021   Chronic pruritus 06/10/2021   Exercise-induced asthma 06/09/2021   Chronic venous insufficiency 06/03/2021   Orthostatic lightheadedness 06/03/2021   Purple toe syndrome of  both feet (HCC) 06/03/2021   Autonomic dysfunction 06/03/2021   Labral tear of hip, degenerative 04/13/2021   Migraine headache 03/01/2021   Headache 11/30/2020   Positive ANA (antinuclear antibody) 11/15/2020   Fibromyalgia 11/02/2020   Hair loss 11/02/2020   Muscle spasticity 10/22/2020   Hypermobile Ehlers-Danlos syndrome 10/22/2020   Paresthesia of bilateral legs 10/06/2020   Myalgia 10/06/2020   Chronic pelvic pain in female 10/06/2020   Chronic abdominal pain 10/06/2020   Irritable colon 08/02/2020   Pelvic floor dysfunction 08/02/2020   Irritant dermatitis 08/02/2020   Gastroesophageal reflux disease 06/28/2020   Severe recurrent major depression without psychotic features (HCC) 06/28/2020   Polyarthralgia 06/28/2020   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 06/28/2020   Tachycardia 06/28/2020   History of COVID-19 06/28/2020   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 deficit 06/28/2020   Recurrent major depressive disorder, in full remission (HCC) 06/28/2020   Atypical chest pain 06/22/2020   Dizziness 06/22/2020   Palpitations 06/14/2020   Anxiety 05/04/2020   Internal and external hemorrhoids without complication 04/29/2020   Nondiabetic gastroparesis 03/03/2020   ETD (Eustachian tube dysfunction), bilateral 10/21/2019   Acne vulgaris 03/11/2014   Mild intermittent asthma without complication 03/11/2014   Seasonal allergies 03/11/2014    REFERRING DIAG: M25.551 (ICD-10-CM) - Pain in right hip  THERAPY  DIAG:  Neck pain  Pain in right hip  Difficulty in walking, not elsewhere classified  Muscle weakness (generalized)  Rationale for Evaluation and Treatment Rehabilitation  PERTINENT HISTORY: 03/02/2021: RIGHT HIP ARTHROSCOPY WITH LABRAL REPAIR AND PINCE DEBRIDEMENT, anxiety, depression, fibromyalgia, asthma, Symptomatic Ehler's-Danlos syndrome  PRECAUTIONS: 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 She also reports doing her HEP periodically.   PAIN:  Are you having pain? Yes: NPRS scale: 6/10 Pain location: Posterior neck/ upper back Pain description: Achy, sharp Aggravating factors: BIL cervical rotation, looking down, looking down at computer screen/ phone Relieving factors: IcyHot   OBJECTIVE: (objective measures completed at initial evaluation unless otherwise dated)   DIAGNOSTIC FINDINGS: MR Hip Right with contrast: IMPRESSION: 1. Full-thickness tear of the right anterior superior labrum.   PATIENT SURVEYS:  FOTO Cervical: 59%, predicted 67% in 11 visits; Hip: 58%, predicted 71% in 13 visits   COGNITION:           Overall cognitive status: Within functional limits for tasks assessed                          SENSATION: Not tested     POSTURE:  Forward head/ BIL rounded shoulders   PALPATION: TTP with palpable trigger points to BIL cervical paraspinals/ UT/ mid trap           PASSIVE ACCESSORIES: Hypermobile/ painful CPAs C2-C3, C5-C7 Hypomobile/ painful CPAs C4-C5, T1-T8   CERVICAL ROM:    AROM A/PROM (deg) 10/25/2021  Flexion 40p!  Extension 65p!!  Right lateral flexion 45  Left lateral flexion 45  Right rotation 61p!  Left rotation 68p!   (Blank rows = not tested)   UE ROM:   AROM Right 10/25/2021 Left 10/25/2021  Shoulder flexion WNL WNL  Shoulder abduction WNL WNL   (Blank rows = not tested)   UE MMT:   MMT Right 10/25/2021 Left 10/25/2021  Shoulder flexion 5/5 5/5  Shoulder abduction 5/5 5/5  Cervical flexion 4/5p!  Cervical extension 4/5p!  Cervical side bend 5/5 5/5  Shoulder IR 5/5 5/5  Shoulder ER 5/5 5/5  Mid trap 3/5 3/5  Low trap 3/5 3/5  Latissimus dorsi 3/5 3/5   (Blank rows = not tested)   CERVICAL SPECIAL TESTS:  Cervical distraction: (-) Cervical compression: (+) Quadrant testing: (+) BIL     FUNCTIONAL TESTS:  DNF endurance test: 15 seconds        TODAY'S TREATMENT:  [facility] Adult PT Treatment:                                                DATE: 11/02/2021 Therapeutic Exercise: Seated low rows with 30# cable 2x10 Seated high rows with 30# cable 2x10 Seated lat pulldowns with 25# cable 2x10 Seated shoulder rolls 2x10 forward and backward Standing BIL shoulder extension with 3# cables 3x10 Manual Therapy: Skilled palpation to identify trigger points prior to TPDN Effleurage to BIL UT/ suboccipitals following TPDN Neuromuscular re-ed: N/A Therapeutic Activity: N/A Modalities: N/A Self Care: N/A  Trigger Point Dry-Needling  Treatment instructions: Expect mild to moderate muscle soreness. S/S of pneumothorax if dry needled over a lung field, and to seek immediate medical attention should they occur. Patient verbalized understanding of these instructions  and education.  Patient Consent Given: Yes Education handout provided: Yes Muscles treated: BIL UT, BIL suboccipital musculature Electrical stimulation performed: No Parameters: N/A Treatment response/outcome: Multiple twitch responses, improved muscle extensibility, improved pain      PATIENT EDUCATION:  Education details: Pt educated on underlying pathophysiology behind her pain presentation, POC, prognosis, FOTO, and HEP Person educated: Patient Education method: Programmer, multimediaxplanation, Demonstration, and Handouts Education comprehension: verbalized understanding and returned demonstration     HOME EXERCISE PROGRAM: Access Code: [email or web address] URL: [email or web address] Date: 10/25/2021 Prepared by: Carmelina Daneucker Tashawn Laswell   Exercises - Seated Cervical Retraction  - 1 x daily - 7 x weekly - 3 sets - 10 reps - 3-sec hold - Standing Shoulder Row with Anchored Resistance  - 1 x daily - 7 x weekly - 3 sets - 10 reps - 3-sec hold - Low trap slides at wall with lift-off  - 1 x daily - 7 x weekly - 3 sets - 10 reps - 3-sec hold - Shoulder Extension with Resistance  - 1 x daily -  7 x weekly - 3 sets - 10 reps - 3-sec hold   ASSESSMENT:   CLINICAL IMPRESSION: Pt responded excellently to all interventions today, demonstrating good form and no increase in pain with selected exercises. She also responded well to TPDN and manual techniques today. Multiple twitch responses were elicited in each muscle treated today. She will continue to benefit from skilled PT to address her primary impairments and return to her prior level of function with less limitation.     OBJECTIVE IMPAIRMENTS Abnormal gait, decreased balance, decreased mobility, difficulty walking, decreased ROM, decreased strength, hypomobility, increased edema, increased muscle spasms, impaired flexibility, improper body mechanics, postural dysfunction, and pain.    ACTIVITY LIMITATIONS carrying, lifting, bending, sitting, standing, squatting, sleeping, stairs, transfers, and locomotion level   PARTICIPATION LIMITATIONS: meal prep, cleaning, laundry, interpersonal relationship, driving, shopping, community activity, occupation, and yard work   PERSONAL FACTORS Past/current experiences, Time since onset of injury/illness/exacerbation, and 3+ comorbidities: See medical hx  are also affecting patient's functional outcome.        GOALS: Goals reviewed with patient? Yes   SHORT TERM GOALS: Target date: 11/22/2021  Pt will report understanding and adherence to initial HEP in order to promote independence in the management of primary impairments. Baseline: HEP provided at eval Goal status: INITIAL     LONG TERM GOALS: Target date: 12/20/2021    Pt will achieve a cervical FOTO score of 67% in order to demonstrate improved functional ability as it relates to her primary impairments. Baseline: 59% Goal status: INITIAL   2.  Pt will achieve BIL global parascapular strength of 4+/5 or higher in order to promote improved posture and prophylaxis of future mechanical neck pain Baseline: 3/5 global parascapular  strength Goal status: INITIAL   3.  Pt will achieve global cervical AROM WNL and with 0-2/10 pain in order to get dressed with less limitation. Baseline: See AROM chart Goal status: INITIAL   4.  Pt will report ability to complete a full work day, including looking down at her computer screen with 0-3/10 pain in order to complete her work duties with less limitation. Baseline: Pt has >6/10 pain at the end of her work day Goal status: INITIAL       PLAN: PT FREQUENCY: 1x/week   PT DURATION: 8 weeks   PLANNED INTERVENTIONS: Therapeutic exercises, Therapeutic activity, Neuromuscular re-education, Balance training, Gait training, Patient/Family education, Joint mobilization, Stair training, Aquatic Therapy, Dry Needling,  Electrical stimulation, Spinal mobilization, Cryotherapy, Moist heat, Taping, Vasopneumatic device, Traction, Biofeedback, Ionotophoresis 4mg /ml Dexamethasone, Manual therapy, and Re-evaluation   PLAN FOR NEXT SESSION: Progress DNF/ parascapular strength/ manual techniques for pain (no OMT due to EDS)    , PT 11/02/2021, 6:34 PM

## 2021-11-02 NOTE — Patient Instructions (Signed)

## 2021-11-03 DIAGNOSIS — Q7962 Hypermobile Ehlers-Danlos syndrome: Secondary | ICD-10-CM | POA: Diagnosis not present

## 2021-11-03 DIAGNOSIS — T753XXD Motion sickness, subsequent encounter: Secondary | ICD-10-CM | POA: Diagnosis not present

## 2021-11-03 DIAGNOSIS — E611 Iron deficiency: Secondary | ICD-10-CM | POA: Diagnosis not present

## 2021-11-03 DIAGNOSIS — L299 Pruritus, unspecified: Secondary | ICD-10-CM | POA: Diagnosis not present

## 2021-11-07 DIAGNOSIS — G894 Chronic pain syndrome: Secondary | ICD-10-CM | POA: Diagnosis not present

## 2021-11-07 DIAGNOSIS — F3341 Major depressive disorder, recurrent, in partial remission: Secondary | ICD-10-CM | POA: Diagnosis not present

## 2021-11-07 DIAGNOSIS — F411 Generalized anxiety disorder: Secondary | ICD-10-CM | POA: Diagnosis not present

## 2021-11-08 DIAGNOSIS — I872 Venous insufficiency (chronic) (peripheral): Secondary | ICD-10-CM | POA: Diagnosis not present

## 2021-11-08 DIAGNOSIS — R1900 Intra-abdominal and pelvic swelling, mass and lump, unspecified site: Secondary | ICD-10-CM | POA: Diagnosis not present

## 2021-11-08 DIAGNOSIS — Q7962 Hypermobile Ehlers-Danlos syndrome: Secondary | ICD-10-CM | POA: Diagnosis not present

## 2021-11-08 DIAGNOSIS — I75023 Atheroembolism of bilateral lower extremities: Secondary | ICD-10-CM | POA: Diagnosis not present

## 2021-11-08 DIAGNOSIS — M79605 Pain in left leg: Secondary | ICD-10-CM | POA: Diagnosis not present

## 2021-11-08 DIAGNOSIS — K581 Irritable bowel syndrome with constipation: Secondary | ICD-10-CM | POA: Diagnosis not present

## 2021-11-08 DIAGNOSIS — R109 Unspecified abdominal pain: Secondary | ICD-10-CM | POA: Diagnosis not present

## 2021-11-08 DIAGNOSIS — K3184 Gastroparesis: Secondary | ICD-10-CM | POA: Diagnosis not present

## 2021-11-08 DIAGNOSIS — R140 Abdominal distension (gaseous): Secondary | ICD-10-CM | POA: Diagnosis not present

## 2021-11-08 DIAGNOSIS — R42 Dizziness and giddiness: Secondary | ICD-10-CM | POA: Diagnosis not present

## 2021-11-08 DIAGNOSIS — G909 Disorder of the autonomic nervous system, unspecified: Secondary | ICD-10-CM | POA: Diagnosis not present

## 2021-11-09 ENCOUNTER — Ambulatory Visit: Payer: BC Managed Care – PPO | Admitting: Physical Therapy

## 2021-11-09 ENCOUNTER — Encounter: Payer: Self-pay | Admitting: Physical Therapy

## 2021-11-09 DIAGNOSIS — M542 Cervicalgia: Secondary | ICD-10-CM

## 2021-11-09 DIAGNOSIS — M25551 Pain in right hip: Secondary | ICD-10-CM

## 2021-11-09 DIAGNOSIS — M6281 Muscle weakness (generalized): Secondary | ICD-10-CM

## 2021-11-09 DIAGNOSIS — R262 Difficulty in walking, not elsewhere classified: Secondary | ICD-10-CM | POA: Diagnosis not present

## 2021-11-09 NOTE — Therapy (Signed)
OUTPATIENT PHYSICAL THERAPY TREATMENT NOTE   Patient Name: Brianna [name] MRN: [id number] DOB:1995-12-29, 26 y.o., female Today's Date: 11/10/2021  PCP: Cathe Mons, MD REFERRING PROVIDER: [name], MD  END OF SESSION:   PT End of Session - 11/09/21 1749     Visit Number 3    Number of Visits 9    Date for PT Re-Evaluation 12/27/21    Authorization Type BCBS    Authorization Time Period FOTO v6, v10    PT Start Time 0548    PT Stop Time 0628    PT Time Calculation (min) 40 min    Activity Tolerance Patient tolerated treatment well    Behavior During Therapy [facility] for tasks assessed/performed             Past Medical History:  Diagnosis Date   Acute recurrent pansinusitis 10/21/2019   ADHD (attention deficit hyperactivity disorder)    inattentive   Anemia    Anxiety    Asthma    Bronchitis    Cat allergy due to both airborne and skin contact 05/01/2016   Chronic sore throat 05/01/2016   COVID 2020   Depression    Fibromyalgia    Gastroparesis    GERD (gastroesophageal reflux disease)    Hypermobile Ehlers-Danlos syndrome    Irregular heart rate    Keratosis pilaris    Migraine    Seasonal allergic rhinitis 03/11/2014   Past Surgical History:  Procedure Laterality Date   ESOPHAGOGASTRODUODENOSCOPY ENDOSCOPY     03/2020   JOINT REPLACEMENT     LABRAL REPAIR Right 03/02/2021   Procedure: RIGHT HIP ARTHROSCOPY WITH LABRAL REPAIR AND PINCE DEBRIDEMENT;  Surgeon: [name], MD;  Location: [facility] OR;  Service: Orthopedics;  Laterality: Right;   SMART PILL PROCEDURE     12/2020   TYMPANOSTOMY TUBE PLACEMENT Bilateral    WISDOM TOOTH EXTRACTION     Patient Active Problem List   Diagnosis Date Noted   Cervical adenopathy 06/10/2021   Chronic pruritus 06/10/2021   Exercise-induced asthma 06/09/2021   Chronic venous insufficiency 06/03/2021   Orthostatic lightheadedness 06/03/2021   Purple toe syndrome of both feet (HCC) 06/03/2021    Autonomic dysfunction 06/03/2021   Labral tear of hip, degenerative 04/13/2021   Migraine headache 03/01/2021   Headache 11/30/2020   Positive ANA (antinuclear antibody) 11/15/2020   Fibromyalgia 11/02/2020   Hair loss 11/02/2020   Muscle spasticity 10/22/2020   Hypermobile Ehlers-Danlos syndrome 10/22/2020   Paresthesia of bilateral legs 10/06/2020   Myalgia 10/06/2020   Chronic pelvic pain in female 10/06/2020   Chronic abdominal pain 10/06/2020   Irritable colon 08/02/2020   Pelvic floor dysfunction 08/02/2020   Irritant dermatitis 08/02/2020   Gastroesophageal reflux disease 06/28/2020   Severe recurrent major depression without psychotic features (HCC) 06/28/2020   Polyarthralgia 06/28/2020   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 06/28/2020   Tachycardia 06/28/2020   History of COVID-19 06/28/2020   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 deficit 06/28/2020   Recurrent major depressive disorder, in full remission (HCC) 06/28/2020   Atypical chest pain 06/22/2020   Dizziness 06/22/2020   Palpitations 06/14/2020   Anxiety 05/04/2020   Internal and external hemorrhoids without complication 04/29/2020   Nondiabetic gastroparesis 03/03/2020   ETD (Eustachian tube dysfunction), bilateral 10/21/2019   Acne vulgaris 03/11/2014   Mild intermittent asthma without complication 03/11/2014   Seasonal allergies 03/11/2014    REFERRING DIAG: M25.551 (ICD-10-CM) - Pain in right hip  THERAPY DIAG:  Neck pain  Pain  in right hip  Muscle weakness (generalized)  Rationale for Evaluation and Treatment Rehabilitation  PERTINENT HISTORY: 03/02/2021: RIGHT HIP ARTHROSCOPY WITH LABRAL REPAIR AND PINCE DEBRIDEMENT, anxiety, depression, fibromyalgia, asthma, Symptomatic Ehler's-Danlos syndrome  PRECAUTIONS: 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  PAIN:  Are you  having pain? Yes: NPRS scale: 4/10 Pain location: Posterior neck/ upper back Pain description: Achy, sharp Aggravating factors: BIL cervical rotation, looking down, looking down at computer screen/ phone Relieving factors: IcyHot   OBJECTIVE: (objective measures completed at initial evaluation unless otherwise dated)   DIAGNOSTIC FINDINGS: MR Hip Right with contrast: IMPRESSION: 1. Full-thickness tear of the right anterior superior labrum.   PATIENT SURVEYS:  FOTO Cervical: 59%, predicted 67% in 11 visits; Hip: 58%, predicted 71% in 13 visits   COGNITION:           Overall cognitive status: Within functional limits for tasks assessed                          SENSATION: Not tested     POSTURE:  Forward head/ BIL rounded shoulders   PALPATION: TTP with palpable trigger points to BIL cervical paraspinals/ UT/ mid trap           PASSIVE ACCESSORIES: Hypermobile/ painful CPAs C2-C3, C5-C7 Hypomobile/ painful CPAs C4-C5, T1-T8   CERVICAL ROM:    AROM A/PROM (deg) 10/25/2021  Flexion 40p!  Extension 65p!!  Right lateral flexion 45  Left lateral flexion 45  Right rotation 61p!  Left rotation 68p!   (Blank rows = not tested)   UE ROM:   AROM Right 10/25/2021 Left 10/25/2021  Shoulder flexion WNL WNL  Shoulder abduction WNL WNL   (Blank rows = not tested)   UE MMT:   MMT Right 10/25/2021 Left 10/25/2021  Shoulder flexion 5/5 5/5  Shoulder abduction 5/5 5/5  Cervical flexion 4/5p!  Cervical extension 4/5p!  Cervical side bend 5/5 5/5  Shoulder IR 5/5 5/5  Shoulder ER 5/5 5/5  Mid trap 3/5 3/5  Low trap 3/5 3/5  Latissimus dorsi 3/5 3/5   (Blank rows = not tested)   CERVICAL SPECIAL TESTS:  Cervical distraction: (-) Cervical compression: (+) Quadrant testing: (+) BIL     FUNCTIONAL TESTS:  DNF endurance test: 15 seconds       TODAY'S TREATMENT:   [facility] Adult PT Treatment:                                                DATE: 11/09/2021 Therapeutic  Exercise: Seated low rows with 30# cable 2x10 Seated high rows with 30# cable 2x10 Seated lat pulldowns with 30# cable 2x10 KB bottom up 5# - 2x10 ea YTB D2 flexion  Manual Therapy: Skilled palpation to identify trigger points prior to TPDN STM to BIL UT/ suboccipitals following TPDN   Trigger Point Dry-Needling  Treatment instructions: Expect mild to moderate muscle soreness. S/S of pneumothorax if dry needled over a lung field, and to seek immediate medical attention should they occur. Patient verbalized understanding of these instructions and education.  Patient Consent Given: Yes Education handout provided: Yes Muscles treated: BIL UT, BIL suboccipital musculature, bil mid thoracic paraspinals (estim) Electrical stimulation performed: No Parameters: 4 min  low frequency - milli amps - low intensity; 1 min - micro amps - high frequency high intensity. Treatment response/outcome: Multiple twitch responses, improved muscle extensibility, improved pain  [facility] Adult PT Treatment:                                                DATE: 11/02/2021 Therapeutic Exercise: Seated low rows with 30# cable 2x10 Seated high rows with 30# cable 2x10 Seated lat pulldowns with 25# cable 2x10 Seated shoulder rolls 2x10 forward and backward Standing BIL shoulder extension with 3# cables 3x10 Manual Therapy: Skilled palpation to identify trigger points prior to TPDN Effleurage to BIL UT/ suboccipitals following TPDN Neuromuscular re-ed: N/A Therapeutic Activity: N/A Modalities: N/A Self Care: N/A  Trigger Point Dry-Needling  Treatment instructions: Expect mild to moderate muscle soreness. S/S of pneumothorax if dry needled over a lung field, and to seek immediate medical attention should they occur. Patient verbalized understanding of these instructions and education.  Patient Consent Given: Yes Education handout provided: Yes Muscles treated: BIL UT, BIL suboccipital musculature Electrical  stimulation performed: No Parameters: N/A Treatment response/outcome: Multiple twitch responses, improved muscle extensibility, improved pain      HOME EXERCISE PROGRAM: Access Code: [email or web address] URL: [email or web address] Date: 10/25/2021 Prepared by: Carmelina Dane   Exercises - Seated Cervical Retraction  - 1 x daily - 7 x weekly - 3 sets - 10 reps - 3-sec hold - Standing Shoulder Row with Anchored Resistance  - 1 x daily - 7 x weekly - 3 sets - 10 reps - 3-sec hold - Low trap slides at wall with lift-off  - 1 x daily - 7 x weekly - 3 sets - 10 reps - 3-sec hold - Shoulder Extension with Resistance  - 1 x daily - 7 x weekly - 3 sets - 10 reps - 3-sec hold   ASSESSMENT:   CLINICAL IMPRESSION: Jewels did well with therapy.  Her baseline pain was significantly lower following TDN last session.  We trialed estim + TDN for her thoracic paraspinals/multfidi but she did not care for the sensation so this was D/C'd.  She responded well to TDN + manual with near resolution of discomfort in her neck and thoracic spine following session.     OBJECTIVE IMPAIRMENTS Abnormal gait, decreased balance, decreased mobility, difficulty walking, decreased ROM, decreased strength, hypomobility, increased edema, increased muscle spasms, impaired flexibility, improper body mechanics, postural dysfunction, and pain.    ACTIVITY LIMITATIONS carrying, lifting, bending, sitting, standing, squatting, sleeping, stairs, transfers, and locomotion level   PARTICIPATION LIMITATIONS: meal prep, cleaning, laundry, interpersonal relationship, driving, shopping, community activity, occupation, and yard work   PERSONAL FACTORS Past/current experiences, Time since onset of injury/illness/exacerbation, and 3+ comorbidities: See medical hx  are also affecting patient's functional outcome.        GOALS: Goals reviewed with patient? Yes   SHORT TERM GOALS: Target date: 11/22/2021  Pt will report  understanding and adherence to initial HEP in order to promote independence in the management of primary impairments. Baseline: HEP provided at eval Goal status: INITIAL     LONG TERM GOALS: Target date: 12/20/2021    Pt will achieve a cervical FOTO score of 67% in order to demonstrate improved functional ability as it relates to her primary impairments. Baseline: 59% Goal status: INITIAL   2.  Pt will achieve BIL global parascapular  strength of 4+/5 or higher in order to promote improved posture and prophylaxis of future mechanical neck pain Baseline: 3/5 global parascapular strength Goal status: INITIAL   3.  Pt will achieve global cervical AROM WNL and with 0-2/10 pain in order to get dressed with less limitation. Baseline: See AROM chart Goal status: INITIAL   4.  Pt will report ability to complete a full work day, including looking down at her computer screen with 0-3/10 pain in order to complete her work duties with less limitation. Baseline: Pt has >6/10 pain at the end of her work day Goal status: INITIAL       PLAN: PT FREQUENCY: 1x/week   PT DURATION: 8 weeks   PLANNED INTERVENTIONS: Therapeutic exercises, Therapeutic activity, Neuromuscular re-education, Balance training, Gait training, Patient/Family education, Joint mobilization, Stair training, Aquatic Therapy, Dry Needling, Electrical stimulation, Spinal mobilization, Cryotherapy, Moist heat, Taping, Vasopneumatic device, Traction, Biofeedback, Ionotophoresis 4mg /ml Dexamethasone, Manual therapy, and Re-evaluation   PLAN FOR NEXT SESSION: Progress DNF/ parascapular strength/ manual techniques for pain (no OMT due to EDS)    , PT 11/10/2021, 11:53 AM

## 2021-11-10 DIAGNOSIS — L700 Acne vulgaris: Secondary | ICD-10-CM | POA: Diagnosis not present

## 2021-11-10 DIAGNOSIS — L659 Nonscarring hair loss, unspecified: Secondary | ICD-10-CM | POA: Diagnosis not present

## 2021-11-10 DIAGNOSIS — L299 Pruritus, unspecified: Secondary | ICD-10-CM | POA: Diagnosis not present

## 2021-11-13 ENCOUNTER — Encounter (HOSPITAL_BASED_OUTPATIENT_CLINIC_OR_DEPARTMENT_OTHER): Payer: Self-pay | Admitting: Orthopaedic Surgery

## 2021-11-14 ENCOUNTER — Other Ambulatory Visit (HOSPITAL_BASED_OUTPATIENT_CLINIC_OR_DEPARTMENT_OTHER): Payer: Self-pay | Admitting: Orthopaedic Surgery

## 2021-11-14 DIAGNOSIS — R42 Dizziness and giddiness: Secondary | ICD-10-CM | POA: Diagnosis not present

## 2021-11-14 DIAGNOSIS — I75023 Atheroembolism of bilateral lower extremities: Secondary | ICD-10-CM | POA: Diagnosis not present

## 2021-11-14 DIAGNOSIS — I872 Venous insufficiency (chronic) (peripheral): Secondary | ICD-10-CM | POA: Diagnosis not present

## 2021-11-14 DIAGNOSIS — R197 Diarrhea, unspecified: Secondary | ICD-10-CM | POA: Diagnosis not present

## 2021-11-14 DIAGNOSIS — G909 Disorder of the autonomic nervous system, unspecified: Secondary | ICD-10-CM | POA: Diagnosis not present

## 2021-11-14 MED ORDER — LIDOCAINE 5 % EX PTCH: 1.0000 | MEDICATED_PATCH | CUTANEOUS | 1 refills | Status: DC

## 2021-11-16 ENCOUNTER — Ambulatory Visit: Payer: BC Managed Care – PPO

## 2021-11-16 DIAGNOSIS — M6281 Muscle weakness (generalized): Secondary | ICD-10-CM | POA: Diagnosis not present

## 2021-11-16 DIAGNOSIS — M25551 Pain in right hip: Secondary | ICD-10-CM | POA: Diagnosis not present

## 2021-11-16 DIAGNOSIS — M542 Cervicalgia: Secondary | ICD-10-CM | POA: Diagnosis not present

## 2021-11-16 DIAGNOSIS — R262 Difficulty in walking, not elsewhere classified: Secondary | ICD-10-CM

## 2021-11-16 NOTE — Therapy (Signed)
OUTPATIENT PHYSICAL THERAPY TREATMENT NOTE   Patient Name: Brianna [name] MRN: [id number] DOB:[birth year], 26 y.o., female Today's Date: 11/16/2021  PCP: Cathe Mons, MD REFERRING PROVIDER: [name], MD  END OF SESSION:   PT End of Session - 11/16/21 1744     Visit Number 4    Number of Visits 9    Date for PT Re-Evaluation 12/27/21    Authorization Type [employer]    Authorization Time Period FOTO v6, v10    PT Start Time 1745    PT Stop Time 1827   4 minutes of dry needle insertion   PT Time Calculation (min) 42 min    Activity Tolerance Patient tolerated treatment well    Behavior During Therapy [facility] for tasks assessed/performed              Past Medical History:  Diagnosis Date   Acute recurrent pansinusitis 10/21/2019   ADHD (attention deficit hyperactivity disorder)    inattentive   Anemia    Anxiety    Asthma    Bronchitis    Cat allergy due to both airborne and skin contact 05/01/2016   Chronic sore throat 05/01/2016   COVID 2020   Depression    Fibromyalgia    Gastroparesis    GERD (gastroesophageal reflux disease)    Hypermobile Ehlers-Danlos syndrome    Irregular heart rate    Keratosis pilaris    Migraine    Seasonal allergic rhinitis 03/11/2014   Past Surgical History:  Procedure Laterality Date   ESOPHAGOGASTRODUODENOSCOPY ENDOSCOPY     03/2020   JOINT REPLACEMENT     LABRAL REPAIR Right 03/02/2021   Procedure: RIGHT HIP ARTHROSCOPY WITH LABRAL REPAIR AND PINCE DEBRIDEMENT;  Surgeon: [name], MD;  Location: [facility] OR;  Service: Orthopedics;  Laterality: Right;   SMART PILL PROCEDURE     12/2020   TYMPANOSTOMY TUBE PLACEMENT Bilateral    WISDOM TOOTH EXTRACTION     Patient Active Problem List   Diagnosis Date Noted   Cervical adenopathy 06/10/2021   Chronic pruritus 06/10/2021   Exercise-induced asthma 06/09/2021   Chronic venous insufficiency 06/03/2021   Orthostatic lightheadedness 06/03/2021   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) 06/03/2021   Autonomic dysfunction 06/03/2021   Labral tear of hip, degenerative 04/13/2021   Migraine headache 03/01/2021   Headache 11/30/2020   Positive ANA (antinuclear antibody) 11/15/2020   Fibromyalgia 11/02/2020   Hair loss 11/02/2020   Muscle spasticity 10/22/2020   Hypermobile Ehlers-Danlos syndrome 10/22/2020   Paresthesia of bilateral legs 10/06/2020   Myalgia 10/06/2020   Chronic pelvic pain in female 10/06/2020   Chronic abdominal pain 10/06/2020   Irritable colon 08/02/2020   Pelvic floor dysfunction 08/02/2020   Irritant dermatitis 08/02/2020   Gastroesophageal reflux disease 06/28/2020   Severe recurrent major depression without psychotic features (HCC) 06/28/2020   Polyarthralgia 06/28/2020   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 06/28/2020   Tachycardia 06/28/2020   History of COVID-19 06/28/2020   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 deficit 06/28/2020   Recurrent major depressive disorder, in full remission (HCC) 06/28/2020   Atypical chest pain 06/22/2020   Dizziness 06/22/2020   Palpitations 06/14/2020   Anxiety 05/04/2020   Internal and external hemorrhoids without complication 04/29/2020   Nondiabetic gastroparesis 03/03/2020   ETD (Eustachian tube dysfunction), bilateral 10/21/2019   Acne vulgaris 03/11/2014   Mild intermittent asthma without complication 03/11/2014   Seasonal allergies 03/11/2014    REFERRING DIAG: M25.551 (ICD-10-CM) - Pain in right hip  THERAPY DIAG:  Neck pain  Pain in right hip  Muscle weakness (generalized)  Difficulty in walking, not elsewhere classified  Rationale for Evaluation and Treatment Rehabilitation  PERTINENT HISTORY: 03/02/2021: RIGHT HIP ARTHROSCOPY WITH LABRAL REPAIR AND PINCE DEBRIDEMENT, anxiety, depression, fibromyalgia, asthma, Symptomatic Ehler's-Danlos syndrome  PRECAUTIONS: 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 She reports a therapeutic response to TPDN for the first few days after treatment, although her pain returns to baseline the 2nd half of the week. She reports adherence to her HEP.   PAIN:  Are you having pain? Yes: NPRS scale: 5-6/10 Pain location: Posterior neck/ upper back Pain description: Achy, sharp Aggravating factors: BIL cervical rotation, looking down, looking down at computer screen/ phone Relieving factors: [employer]   OBJECTIVE: (objective measures completed at initial evaluation unless otherwise dated)   DIAGNOSTIC FINDINGS: MR Hip Right with contrast: IMPRESSION: 1. Full-thickness tear of the right anterior superior labrum.   PATIENT SURVEYS:  FOTO Cervical: 59%, predicted 67% in 11 visits; Hip: 58%, predicted 71% in 13 visits   COGNITION:           Overall cognitive status: Within functional limits for tasks assessed                          SENSATION: Not tested     POSTURE:  Forward head/ BIL rounded shoulders   PALPATION: TTP with palpable trigger points to BIL cervical paraspinals/ UT/ mid trap           PASSIVE ACCESSORIES: Hypermobile/ painful CPAs C2-C3, C5-C7 Hypomobile/ painful CPAs C4-C5, T1-T8   CERVICAL ROM:    AROM A/PROM (deg) 10/25/2021 AROM 11/16/2021  Flexion 40p! 68  Extension 65p!! 42p!  Right lateral flexion 45 45  Left lateral flexion 45 45  Right rotation 61p! 81  Left rotation 68p! 81   (Blank rows = not tested)   UE ROM:   AROM Right 10/25/2021 Left 10/25/2021  Shoulder flexion WNL WNL  Shoulder abduction WNL WNL   (Blank rows = not tested)   UE MMT:   MMT Right 10/25/2021 Left 10/25/2021  Shoulder flexion 5/5 5/5  Shoulder abduction 5/5 5/5  Cervical flexion 4/5p!  Cervical extension 4/5p!  Cervical side bend 5/5 5/5  Shoulder IR 5/5 5/5  Shoulder ER 5/5 5/5  Mid trap 3/5 3/5  Low trap 3/5 3/5  Latissimus dorsi 3/5 3/5   (Blank rows = not tested)   CERVICAL SPECIAL TESTS:   Cervical distraction: (-) Cervical compression: (+) Quadrant testing: (+) BIL     FUNCTIONAL TESTS:  DNF endurance test: 15 seconds       TODAY'S TREATMENT:  [facility] Adult PT Treatment:                                                DATE: 11/16/2021 Therapeutic Exercise: Seated cervical extension SNAG x10 with 5-sec hold Seated low rows with 35# cable 2x10 Seated high rows with 30# cable 2x10 Seated lat pulldowns with 30# cable 2x10 Seated BIL shoulder rolls 2x10 forward and backward  Seated UT stretch x40min BIL Seated levator scap stretch x70min BIL Manual Therapy: Skilled palpation to identify trigger points prior to TPDN Effleurage to BIL UT/ suboccipitals following TPDN Neuromuscular re-ed: N/A Therapeutic Activity: N/A Modalities: N/A  Self Care: N/A  Trigger Point Dry-Needling  Treatment instructions: Expect mild to moderate muscle soreness. S/S of pneumothorax if dry needled over a lung field, and to seek immediate medical attention should they occur. Patient verbalized understanding of these instructions and education.  Patient Consent Given: Yes Education handout provided: Previously Muscles treated: BIL UT, BIL suboccipital musculature Electrical stimulation performed: No Parameters: N/A Treatment response/outcome: Multiple twitch responses, improved muscle extensibility, improved pain   [facility] Adult PT Treatment:                                                DATE: 11/09/2021 Therapeutic Exercise: Seated low rows with 30# cable 2x10 Seated high rows with 30# cable 2x10 Seated lat pulldowns with 30# cable 2x10 KB bottom up 5# - 2x10 ea YTB D2 flexion  Manual Therapy: Skilled palpation to identify trigger points prior to TPDN STM to BIL UT/ suboccipitals following TPDN   Trigger Point Dry-Needling  Treatment instructions: Expect mild to moderate muscle soreness. S/S of pneumothorax if dry needled over a lung field, and to seek immediate medical attention  should they occur. Patient verbalized understanding of these instructions and education.  Patient Consent Given: Yes Education handout provided: Yes Muscles treated: BIL UT, BIL suboccipital musculature, bil mid thoracic paraspinals (estim) Electrical stimulation performed: No Parameters: 4 min low frequency - milli amps - low intensity; 1 min - micro amps - high frequency high intensity. Treatment response/outcome: Multiple twitch responses, improved muscle extensibility, improved pain  [facility] Adult PT Treatment:                                                DATE: 11/02/2021 Therapeutic Exercise: Seated low rows with 30# cable 2x10 Seated high rows with 30# cable 2x10 Seated lat pulldowns with 25# cable 2x10 Seated shoulder rolls 2x10 forward and backward Standing BIL shoulder extension with 3# cables 3x10 Manual Therapy: Skilled palpation to identify trigger points prior to TPDN Effleurage to BIL UT/ suboccipitals following TPDN Neuromuscular re-ed: N/A Therapeutic Activity: N/A Modalities: N/A Self Care: N/A  Trigger Point Dry-Needling  Treatment instructions: Expect mild to moderate muscle soreness. S/S of pneumothorax if dry needled over a lung field, and to seek immediate medical attention should they occur. Patient verbalized understanding of these instructions and education.  Patient Consent Given: Yes Education handout provided: Yes Muscles treated: BIL UT, BIL suboccipital musculature Electrical stimulation performed: No Parameters: N/A Treatment response/outcome: Multiple twitch responses, improved muscle extensibility, improved pain      HOME EXERCISE PROGRAM: Access Code: [email or web address] URL: [email or web address] Date: 10/25/2021 Prepared by: Carmelina Dane   Exercises - Seated Cervical Retraction  - 1 x daily - 7 x weekly - 3 sets - 10 reps - 3-sec hold - Standing Shoulder Row with Anchored Resistance  - 1 x daily - 7 x weekly - 3 sets - 10 reps  - 3-sec hold - Low trap slides at wall with lift-off  - 1 x daily - 7 x weekly - 3 sets - 10 reps - 3-sec hold - Shoulder Extension with Resistance  - 1 x daily - 7 x weekly - 3 sets - 10 reps - 3-sec hold  Added 11/16/2021: - Seated Upper Trapezius Stretch  -  1 x daily - 7 x weekly - 2 sets - 1-min hold - Seated Levator Scapulae Stretch  - 1 x daily - 7 x weekly - 2 sets - 1-min hold   ASSESSMENT:   CLINICAL IMPRESSION: Pt again responded well to TPDN today with improved muscle extensibility following the elicitation of multiple twitch responses. She also reports a therapeutic response to cervical extension SNAGs. Upon re-assessment of cervical ROM, she has made excellent improvement in BIL rotation and flexion AROM. She will continue to benefit from skilled PT to address her primary impairments and return to her prior level of function with less limitation.     OBJECTIVE IMPAIRMENTS Abnormal gait, decreased balance, decreased mobility, difficulty walking, decreased ROM, decreased strength, hypomobility, increased edema, increased muscle spasms, impaired flexibility, improper body mechanics, postural dysfunction, and pain.    ACTIVITY LIMITATIONS carrying, lifting, bending, sitting, standing, squatting, sleeping, stairs, transfers, and locomotion level   PARTICIPATION LIMITATIONS: meal prep, cleaning, laundry, interpersonal relationship, driving, shopping, community activity, occupation, and yard work   PERSONAL FACTORS Past/current experiences, Time since onset of injury/illness/exacerbation, and 3+ comorbidities: See medical hx  are also affecting patient's functional outcome.        GOALS: Goals reviewed with patient? Yes   SHORT TERM GOALS: Target date: 11/22/2021  Pt will report understanding and adherence to initial HEP in order to promote independence in the management of primary impairments. Baseline: HEP provided at eval Goal status: INITIAL     LONG TERM GOALS: Target date:  12/20/2021    Pt will achieve a cervical FOTO score of 67% in order to demonstrate improved functional ability as it relates to her primary impairments. Baseline: 59% Goal status: INITIAL   2.  Pt will achieve BIL global parascapular strength of 4+/5 or higher in order to promote improved posture and prophylaxis of future mechanical neck pain Baseline: 3/5 global parascapular strength Goal status: INITIAL   3.  Pt will achieve global cervical AROM WNL and with 0-2/10 pain in order to get dressed with less limitation. Baseline: See AROM chart Goal status: INITIAL   4.  Pt will report ability to complete a full work day, including looking down at her computer screen with 0-3/10 pain in order to complete her work duties with less limitation. Baseline: Pt has >6/10 pain at the end of her work day Goal status: INITIAL       PLAN: PT FREQUENCY: 1x/week   PT DURATION: 8 weeks   PLANNED INTERVENTIONS: Therapeutic exercises, Therapeutic activity, Neuromuscular re-education, Balance training, Gait training, Patient/Family education, Joint mobilization, Stair training, Aquatic Therapy, Dry Needling, Electrical stimulation, Spinal mobilization, Cryotherapy, Moist heat, Taping, Vasopneumatic device, Traction, Biofeedback, Ionotophoresis 4mg /ml Dexamethasone, Manual therapy, and Re-evaluation   PLAN FOR NEXT SESSION: Progress DNF/ parascapular strength/ manual techniques for pain (no OMT due to EDS)    , PT, DPT 11/16/21 6:28 PM

## 2021-11-19 DIAGNOSIS — F603 Borderline personality disorder: Secondary | ICD-10-CM | POA: Diagnosis not present

## 2021-11-19 DIAGNOSIS — F411 Generalized anxiety disorder: Secondary | ICD-10-CM | POA: Diagnosis not present

## 2021-11-19 DIAGNOSIS — F3341 Major depressive disorder, recurrent, in partial remission: Secondary | ICD-10-CM | POA: Diagnosis not present

## 2021-11-23 ENCOUNTER — Ambulatory Visit: Payer: BC Managed Care – PPO | Attending: Student in an Organized Health Care Education/Training Program

## 2021-11-23 DIAGNOSIS — R262 Difficulty in walking, not elsewhere classified: Secondary | ICD-10-CM | POA: Insufficient documentation

## 2021-11-23 DIAGNOSIS — M542 Cervicalgia: Secondary | ICD-10-CM | POA: Diagnosis not present

## 2021-11-23 DIAGNOSIS — M6281 Muscle weakness (generalized): Secondary | ICD-10-CM | POA: Insufficient documentation

## 2021-11-23 DIAGNOSIS — M25551 Pain in right hip: Secondary | ICD-10-CM | POA: Diagnosis not present

## 2021-11-23 NOTE — Therapy (Signed)
OUTPATIENT PHYSICAL THERAPY TREATMENT NOTE   Patient Name: Brianna [name] MRN: [id number] DOB:Jul 16, 1995, 26 y.o., female Today's Date: 11/23/2021  PCP: Cathe Mons, MD REFERRING PROVIDER: [name], MD  END OF SESSION:   PT End of Session - 11/23/21 1745     Visit Number 5    Number of Visits 9    Date for PT Re-Evaluation 12/27/21    Authorization Type BC[employer]    Authorization Time Period FOTO v6, v10    PT Start Time 1745    PT Stop Time 1828   5 minutes dry needle insertion   PT Time Calculation (min) 43 min    Activity Tolerance Patient tolerated treatment well    Behavior During Therapy [facility] for tasks assessed/performed               Past Medical History:  Diagnosis Date   Acute recurrent pansinusitis 10/21/2019   ADHD (attention deficit hyperactivity disorder)    inattentive   Anemia    Anxiety    Asthma    Bronchitis    Cat allergy due to both airborne and skin contact 05/01/2016   Chronic sore throat 05/01/2016   COVID 2020   Depression    Fibromyalgia    Gastroparesis    GERD (gastroesophageal reflux disease)    Hypermobile Ehlers-Danlos syndrome    Irregular heart rate    Keratosis pilaris    Migraine    Seasonal allergic rhinitis 03/11/2014   Past Surgical History:  Procedure Laterality Date   ESOPHAGOGASTRODUODENOSCOPY ENDOSCOPY     03/2020   JOINT REPLACEMENT     LABRAL REPAIR Right 03/02/2021   Procedure: RIGHT HIP ARTHROSCOPY WITH LABRAL REPAIR AND PINCE DEBRIDEMENT;  Surgeon: [name], MD;  Location: [facility] OR;  Service: Orthopedics;  Laterality: Right;   SMART PILL PROCEDURE     12/2020   TYMPANOSTOMY TUBE PLACEMENT Bilateral    WISDOM TOOTH EXTRACTION     Patient Active Problem List   Diagnosis Date Noted   Cervical adenopathy 06/10/2021   Chronic pruritus 06/10/2021   Exercise-induced asthma 06/09/2021   Chronic venous insufficiency 06/03/2021   Orthostatic lightheadedness 06/03/2021   Purple toe syndrome  of both feet (HCC) 06/03/2021   Autonomic dysfunction 06/03/2021   Labral tear of hip, degenerative 04/13/2021   Migraine headache 03/01/2021   Headache 11/30/2020   Positive ANA (antinuclear antibody) 11/15/2020   Fibromyalgia 11/02/2020   Hair loss 11/02/2020   Muscle spasticity 10/22/2020   Hypermobile Ehlers-Danlos syndrome 10/22/2020   Paresthesia of bilateral legs 10/06/2020   Myalgia 10/06/2020   Chronic pelvic pain in female 10/06/2020   Chronic abdominal pain 10/06/2020   Irritable colon 08/02/2020   Pelvic floor dysfunction 08/02/2020   Irritant dermatitis 08/02/2020   Gastroesophageal reflux disease 06/28/2020   Severe recurrent major depression without psychotic features (HCC) 06/28/2020   Polyarthralgia 06/28/2020   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 06/28/2020   Tachycardia 06/28/2020   History of COVID-19 06/28/2020   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 deficit 06/28/2020   Recurrent major depressive disorder, in full remission (HCC) 06/28/2020   Atypical chest pain 06/22/2020   Dizziness 06/22/2020   Palpitations 06/14/2020   Anxiety 05/04/2020   Internal and external hemorrhoids without complication 04/29/2020   Nondiabetic gastroparesis 03/03/2020   ETD (Eustachian tube dysfunction), bilateral 10/21/2019   Acne vulgaris 03/11/2014   Mild intermittent asthma without complication 03/11/2014   Seasonal allergies 03/11/2014    REFERRING DIAG: M25.551 (ICD-10-CM) - Pain in right hip  THERAPY DIAG:  Neck pain  Pain in right hip  Muscle weakness (generalized)  Rationale for Evaluation and Treatment Rehabilitation  PERTINENT HISTORY: 03/02/2021: RIGHT HIP ARTHROSCOPY WITH LABRAL REPAIR AND PINCE DEBRIDEMENT, anxiety, depression, fibromyalgia, asthma, Symptomatic Ehler's-Danlos syndrome  PRECAUTIONS: 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 She reports having her  first round of Emgality injections yesterday for migraines. Pt reports 5/10 Rt UT/ cervical pain today and requests TPDN.   PAIN:  Are you having pain? Yes: NPRS scale: 5-6/10 Pain location: Posterior neck/ upper back Pain description: Achy, sharp Aggravating factors: BIL cervical rotation, looking down, looking down at computer screen/ phone Relieving factors: I[employer]   OBJECTIVE: (objective measures completed at initial evaluation unless otherwise dated)   DIAGNOSTIC FINDINGS: MR Hip Right with contrast: IMPRESSION: 1. Full-thickness tear of the right anterior superior labrum.   PATIENT SURVEYS:  FOTO Cervical: 59%, predicted 67% in 11 visits; Hip: 58%, predicted 71% in 13 visits   COGNITION:           Overall cognitive status: Within functional limits for tasks assessed                          SENSATION: Not tested     POSTURE:  Forward head/ BIL rounded shoulders   PALPATION: TTP with palpable trigger points to BIL cervical paraspinals/ UT/ mid trap           PASSIVE ACCESSORIES: Hypermobile/ painful CPAs C2-C3, C5-C7 Hypomobile/ painful CPAs C4-C5, T1-T8   CERVICAL ROM:    AROM A/PROM (deg) 10/25/2021 AROM 11/16/2021  Flexion 40p! 68  Extension 65p!! 42p!  Right lateral flexion 45 45  Left lateral flexion 45 45  Right rotation 61p! 81  Left rotation 68p! 81   (Blank rows = not tested)   UE ROM:   AROM Right 10/25/2021 Left 10/25/2021  Shoulder flexion WNL WNL  Shoulder abduction WNL WNL   (Blank rows = not tested)   UE MMT:   MMT Right 10/25/2021 Left 10/25/2021  Shoulder flexion 5/5 5/5  Shoulder abduction 5/5 5/5  Cervical flexion 4/5p!  Cervical extension 4/5p!  Cervical side bend 5/5 5/5  Shoulder IR 5/5 5/5  Shoulder ER 5/5 5/5  Mid trap 3/5 3/5  Low trap 3/5 3/5  Latissimus dorsi 3/5 3/5   (Blank rows = not tested)   CERVICAL SPECIAL TESTS:  Cervical distraction: (-) Cervical compression: (+) Quadrant testing: (+) BIL     FUNCTIONAL  TESTS:  DNF endurance test: 15 seconds       TODAY'S TREATMENT:  [facility] Adult PT Treatment:                                                DATE: 11/23/2021 Therapeutic Exercise: Forward lunge push-pull with two 10# cables at [employer] machine 2x10 BIL Standing low rows with 7# cables 3x10 Standing alternating shoulder scaption with 5# dumbbells 2x15 BIL Standing corner pec stretch x66min Manual Therapy: Skilled palpation to identify trigger points prior to TPDN Effleurage to BIL UT/ suboccipitals/ MT following TPDN Neuromuscular re-ed: N/A Therapeutic Activity: N/A Modalities: N/A Self Care: N/A  Trigger Point Dry-Needling  Treatment instructions: Expect mild to moderate muscle soreness. S/S of pneumothorax if dry needled over a lung field, and to  seek immediate medical attention should they occur. Patient verbalized understanding of these instructions and education.  Patient Consent Given: Yes Education handout provided: Previously Muscles treated: BIL UT, BIL suboccipital musculature, Rt mid trap Electrical stimulation performed: No Parameters: N/A Treatment response/outcome: Multiple twitch responses, improved muscle extensibility, improved pain   [facility] Adult PT Treatment:                                                DATE: 11/16/2021 Therapeutic Exercise: Seated cervical extension SNAG x10 with 5-sec hold Seated low rows with 35# cable 2x10 Seated high rows with 30# cable 2x10 Seated lat pulldowns with 30# cable 2x10 Seated BIL shoulder rolls 2x10 forward and backward  Seated UT stretch x68min BIL Seated levator scap stretch x70min BIL Manual Therapy: Skilled palpation to identify trigger points prior to TPDN Effleurage to BIL UT/ suboccipitals following TPDN Neuromuscular re-ed: N/A Therapeutic Activity: N/A Modalities: N/A Self Care: N/A  Trigger Point Dry-Needling  Treatment instructions: Expect mild to moderate muscle soreness. S/S of pneumothorax if dry  needled over a lung field, and to seek immediate medical attention should they occur. Patient verbalized understanding of these instructions and education.  Patient Consent Given: Yes Education handout provided: Previously Muscles treated: BIL UT, BIL suboccipital musculature Electrical stimulation performed: No Parameters: N/A Treatment response/outcome: Multiple twitch responses, improved muscle extensibility, improved pain   [facility] Adult PT Treatment:                                                DATE: 11/09/2021 Therapeutic Exercise: Seated low rows with 30# cable 2x10 Seated high rows with 30# cable 2x10 Seated lat pulldowns with 30# cable 2x10 KB bottom up 5# - 2x10 ea YTB D2 flexion  Manual Therapy: Skilled palpation to identify trigger points prior to TPDN STM to BIL UT/ suboccipitals following TPDN   Trigger Point Dry-Needling  Treatment instructions: Expect mild to moderate muscle soreness. S/S of pneumothorax if dry needled over a lung field, and to seek immediate medical attention should they occur. Patient verbalized understanding of these instructions and education.  Patient Consent Given: Yes Education handout provided: Yes Muscles treated: BIL UT, BIL suboccipital musculature, bil mid thoracic paraspinals (estim) Electrical stimulation performed: No Parameters: 4 min low frequency - milli amps - low intensity; 1 min - micro amps - high frequency high intensity. Treatment response/outcome: Multiple twitch responses, improved muscle extensibility, improved pain        HOME EXERCISE PROGRAM: Access Code: [email or web address] URL: [email or web address] Date: 10/25/2021 Prepared by: Carmelina Dane   Exercises - Seated Cervical Retraction  - 1 x daily - 7 x weekly - 3 sets - 10 reps - 3-sec hold - Standing Shoulder Row with Anchored Resistance  - 1 x daily - 7 x weekly - 3 sets - 10 reps - 3-sec hold - Low trap slides at wall with lift-off  - 1 x daily - 7 x  weekly - 3 sets - 10 reps - 3-sec hold - Shoulder Extension with Resistance  - 1 x daily - 7 x weekly - 3 sets - 10 reps - 3-sec hold  Added 11/16/2021: - Seated Upper Trapezius Stretch  - 1 x daily - 7 x weekly -  2 sets - 1-min hold - Seated Levator Scapulae Stretch  - 1 x daily - 7 x weekly - 2 sets - 1-min hold   ASSESSMENT:   CLINICAL IMPRESSION: Pt responded well to all interventions today, demonstrating good form and no increase in pain with selected interventions. She again responded well to TPDN and will continue to benefit from skilled PT to address her primary impairments and return to her PLOF with less limitation.     OBJECTIVE IMPAIRMENTS Abnormal gait, decreased balance, decreased mobility, difficulty walking, decreased ROM, decreased strength, hypomobility, increased edema, increased muscle spasms, impaired flexibility, improper body mechanics, postural dysfunction, and pain.    ACTIVITY LIMITATIONS carrying, lifting, bending, sitting, standing, squatting, sleeping, stairs, transfers, and locomotion level   PARTICIPATION LIMITATIONS: meal prep, cleaning, laundry, interpersonal relationship, driving, shopping, community activity, occupation, and yard work   PERSONAL FACTORS Past/current experiences, Time since onset of injury/illness/exacerbation, and 3+ comorbidities: See medical hx  are also affecting patient's functional outcome.        GOALS: Goals reviewed with patient? Yes   SHORT TERM GOALS: Target date: 11/22/2021  Pt will report understanding and adherence to initial HEP in order to promote independence in the management of primary impairments. Baseline: HEP provided at eval Goal status: INITIAL     LONG TERM GOALS: Target date: 12/20/2021    Pt will achieve a cervical FOTO score of 67% in order to demonstrate improved functional ability as it relates to her primary impairments. Baseline: 59% Goal status: INITIAL   2.  Pt will achieve BIL global parascapular  strength of 4+/5 or higher in order to promote improved posture and prophylaxis of future mechanical neck pain Baseline: 3/5 global parascapular strength Goal status: INITIAL   3.  Pt will achieve global cervical AROM WNL and with 0-2/10 pain in order to get dressed with less limitation. Baseline: See AROM chart Goal status: INITIAL   4.  Pt will report ability to complete a full work day, including looking down at her computer screen with 0-3/10 pain in order to complete her work duties with less limitation. Baseline: Pt has >6/10 pain at the end of her work day Goal status: INITIAL       PLAN: PT FREQUENCY: 1x/week   PT DURATION: 8 weeks   PLANNED INTERVENTIONS: Therapeutic exercises, Therapeutic activity, Neuromuscular re-education, Balance training, Gait training, Patient/Family education, Joint mobilization, Stair training, Aquatic Therapy, Dry Needling, Electrical stimulation, Spinal mobilization, Cryotherapy, Moist heat, Taping, Vasopneumatic device, Traction, Biofeedback, Ionotophoresis 4mg /ml Dexamethasone, Manual therapy, and Re-evaluation   PLAN FOR NEXT SESSION: Progress DNF/ parascapular strength/ manual techniques for pain (no OMT due to EDS)    , PT, DPT 11/23/21 6:29 PM

## 2021-11-25 DIAGNOSIS — H9313 Tinnitus, bilateral: Secondary | ICD-10-CM | POA: Diagnosis not present

## 2021-11-28 DIAGNOSIS — F3341 Major depressive disorder, recurrent, in partial remission: Secondary | ICD-10-CM | POA: Diagnosis not present

## 2021-11-28 DIAGNOSIS — F603 Borderline personality disorder: Secondary | ICD-10-CM | POA: Diagnosis not present

## 2021-11-28 DIAGNOSIS — F411 Generalized anxiety disorder: Secondary | ICD-10-CM | POA: Diagnosis not present

## 2021-11-28 DIAGNOSIS — G894 Chronic pain syndrome: Secondary | ICD-10-CM | POA: Diagnosis not present

## 2021-12-01 DIAGNOSIS — R7989 Other specified abnormal findings of blood chemistry: Secondary | ICD-10-CM | POA: Diagnosis not present

## 2021-12-03 ENCOUNTER — Ambulatory Visit: Payer: BC Managed Care – PPO

## 2021-12-03 DIAGNOSIS — R262 Difficulty in walking, not elsewhere classified: Secondary | ICD-10-CM | POA: Diagnosis not present

## 2021-12-03 DIAGNOSIS — M542 Cervicalgia: Secondary | ICD-10-CM | POA: Diagnosis not present

## 2021-12-03 DIAGNOSIS — M25551 Pain in right hip: Secondary | ICD-10-CM | POA: Diagnosis not present

## 2021-12-03 DIAGNOSIS — M6281 Muscle weakness (generalized): Secondary | ICD-10-CM | POA: Diagnosis not present

## 2021-12-03 NOTE — Therapy (Signed)
OUTPATIENT PHYSICAL THERAPY TREATMENT NOTE   Patient Name: Brianna [name] MRN: [id number] DOB:[birth year], 26 y.o., female Today's Date: 12/03/2021  PCP: Josephina Shih, MD REFERRING PROVIDER: Vanetta Mulders, MD  END OF SESSION:   PT End of Session - 12/03/21 1032     Visit Number 6    Number of Visits 9    Date for PT Re-Evaluation 12/27/21    Authorization Type BCBS    Authorization Time Period FOTO v6, v10    PT Start Time 1031    PT Stop Time 1115    PT Time Calculation (min) 44 min    Activity Tolerance Patient tolerated treatment well    Behavior During Therapy [facility] for tasks assessed/performed                Past Medical History:  Diagnosis Date   Acute recurrent pansinusitis 10/21/2019   ADHD (attention deficit hyperactivity disorder)    inattentive   Anemia    Anxiety    Asthma    Bronchitis    Cat allergy due to both airborne and skin contact 05/01/2016   Chronic sore throat 05/01/2016   COVID 2020   Depression    Fibromyalgia    Gastroparesis    GERD (gastroesophageal reflux disease)    Hypermobile Ehlers-Danlos syndrome    Irregular heart rate    Keratosis pilaris    Migraine    Seasonal allergic rhinitis 03/11/2014   Past Surgical History:  Procedure Laterality Date   ESOPHAGOGASTRODUODENOSCOPY ENDOSCOPY     03/2020   JOINT REPLACEMENT     LABRAL REPAIR Right 03/02/2021   Procedure: RIGHT HIP ARTHROSCOPY WITH LABRAL REPAIR AND PINCE DEBRIDEMENT;  Surgeon: Vanetta Mulders, MD;  Location: [facility];  Service: Orthopedics;  Laterality: Right;   SMART PILL PROCEDURE     12/2020   TYMPANOSTOMY TUBE PLACEMENT Bilateral    WISDOM TOOTH EXTRACTION     Patient Active Problem List   Diagnosis Date Noted   Cervical adenopathy 06/10/2021   Chronic pruritus 06/10/2021   Exercise-induced asthma 06/09/2021   Chronic venous insufficiency 06/03/2021   Orthostatic lightheadedness 06/03/2021   Purple toe syndrome of both feet ([place])  06/03/2021   Autonomic dysfunction 06/03/2021   Labral tear of hip, degenerative 04/13/2021   Migraine headache 03/01/2021   Headache 11/30/2020   Positive ANA (antinuclear antibody) 11/15/2020   Fibromyalgia 11/02/2020   Hair loss 11/02/2020   Muscle spasticity 10/22/2020   Hypermobile Ehlers-Danlos syndrome 10/22/2020   Paresthesia of bilateral legs 10/06/2020   Myalgia 10/06/2020   Chronic pelvic pain in female 10/06/2020   Chronic abdominal pain 10/06/2020   Irritable colon 08/02/2020   Pelvic floor dysfunction 08/02/2020   Irritant dermatitis 08/02/2020   Gastroesophageal reflux disease 06/28/2020   Severe recurrent major depression without psychotic features ([place]) 06/28/2020   Polyarthralgia 06/28/2020   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 06/28/2020   Tachycardia 06/28/2020   History of COVID-19 06/28/2020   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 deficit 06/28/2020   Recurrent major depressive disorder, in full remission ([place]) 06/28/2020   Atypical chest pain 06/22/2020   Dizziness 06/22/2020   Palpitations 06/14/2020   Anxiety 05/04/2020   Internal and external hemorrhoids without complication 00/93/8182   Nondiabetic gastroparesis 03/03/2020   ETD (Eustachian tube dysfunction), bilateral 10/21/2019   Acne vulgaris 03/11/2014   Mild intermittent asthma without complication 99/37/1696   Seasonal allergies 03/11/2014    REFERRING DIAG: M25.551 (ICD-10-CM) - Pain in right hip  THERAPY DIAG:  Neck  pain  Pain in right hip  Muscle weakness (generalized)  Rationale for Evaluation and Treatment Rehabilitation  PERTINENT HISTORY: 03/02/2021: RIGHT HIP ARTHROSCOPY WITH LABRAL REPAIR AND PINCE DEBRIDEMENT, anxiety, depression, fibromyalgia, asthma, Symptomatic Ehler's-Danlos syndrome  PRECAUTIONS: 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 She state  the Emgality injections have been going well and she has been noticing her migraines less. She mentioned that she has a new patient appointment with a new therapist this week.  PAIN:  Are you having pain? Yes: NPRS scale: 4/10 Pain location: Posterior neck/ upper back Pain description: Achy, sharp Aggravating factors: BIL cervical rotation, looking down, looking down at computer screen/ phone Relieving factors: [employer]   OBJECTIVE: (objective measures completed at initial evaluation unless otherwise dated)   DIAGNOSTIC FINDINGS: MR Hip Right with contrast: IMPRESSION: 1. Full-thickness tear of the right anterior superior labrum.   PATIENT SURVEYS:  FOTO Cervical: 59%, predicted 67% in 11 visits; Hip: 58%, predicted 71% in 13 visits   COGNITION:           Overall cognitive status: Within functional limits for tasks assessed                          SENSATION: Not tested     POSTURE:  Forward head/ BIL rounded shoulders   PALPATION: TTP with palpable trigger points to BIL cervical paraspinals/ UT/ mid trap           PASSIVE ACCESSORIES: Hypermobile/ painful CPAs C2-C3, C5-C7 Hypomobile/ painful CPAs C4-C5, T1-T8   CERVICAL ROM:    AROM A/PROM (deg) 10/25/2021 AROM 11/16/2021  Flexion 40p! 68  Extension 65p!! 42p!  Right lateral flexion 45 45  Left lateral flexion 45 45  Right rotation 61p! 81  Left rotation 68p! 81   (Blank rows = not tested)   UE ROM:   AROM Right 10/25/2021 Left 10/25/2021  Shoulder flexion WNL WNL  Shoulder abduction WNL WNL   (Blank rows = not tested)   UE MMT:   MMT Right 10/25/2021 Left 10/25/2021  Shoulder flexion 5/5 5/5  Shoulder abduction 5/5 5/5  Cervical flexion 4/5p!  Cervical extension 4/5p!  Cervical side bend 5/5 5/5  Shoulder IR 5/5 5/5  Shoulder ER 5/5 5/5  Mid trap 3/5 3/5  Low trap 3/5 3/5  Latissimus dorsi 3/5 3/5   (Blank rows = not tested)   CERVICAL SPECIAL TESTS:  Cervical distraction: (-) Cervical compression:  (+) Quadrant testing: (+) BIL     FUNCTIONAL TESTS:  DNF endurance test: 15 seconds       TODAY'S TREATMENT: [facility] Adult PT Treatment:                                                DATE: 12/03/2021 Therapeutic Exercise: Forward lunge push-pull with two 10# cables at [employer] machine 2x10 BIL Standing alternating shoulder scaption with 5# dumbbells 2x15 BIL Standing shoulder extension with two 7# cables 2x10 Doorway pec stretch 3x30" Seated low rows with 30# cable 2x10 Seated high rows with 30# cable 2x10 Seated lat pulldowns with 30# cable 2x10 Seated BIL shoulder rolls 2x10 forward and backward  Seated UT stretch x24mn BIL Seated levator scap stretch x1 min BIL SNAGS rotation and cervical extension x10  each   [facility] Adult PT Treatment:                                                DATE: 11/23/2021 Therapeutic Exercise: Forward lunge push-pull with two 10# cables at [employer] machine 2x10 BIL Standing low rows with 7# cables 3x10 Standing alternating shoulder scaption with 5# dumbbells 2x15 BIL Standing corner pec stretch x65mn Manual Therapy: Skilled palpation to identify trigger points prior to TPDN Effleurage to BIL UT/ suboccipitals/ MT following TPDN Neuromuscular re-ed: N/A Therapeutic Activity: N/A Modalities: N/A Self Care: N/A  Trigger Point Dry-Needling  Treatment instructions: Expect mild to moderate muscle soreness. S/S of pneumothorax if dry needled over a lung field, and to seek immediate medical attention should they occur. Patient verbalized understanding of these instructions and education.  Patient Consent Given: Yes Education handout provided: Previously Muscles treated: BIL UT, BIL suboccipital musculature, Rt mid trap Electrical stimulation performed: No Parameters: N/A Treatment response/outcome: Multiple twitch responses, improved muscle extensibility, improved pain      HOME EXERCISE PROGRAM: Access Code: [email or web address]:  [email or web address] Date: 10/25/2021 Prepared by: TVanessa [place]  Exercises - Seated Cervical Retraction  - 1 x daily - 7 x weekly - 3 sets - 10 reps - 3-sec hold - Standing Shoulder Row with Anchored Resistance  - 1 x daily - 7 x weekly - 3 sets - 10 reps - 3-sec hold - Low trap slides at wall with lift-off  - 1 x daily - 7 x weekly - 3 sets - 10 reps - 3-sec hold - Shoulder Extension with Resistance  - 1 x daily - 7 x weekly - 3 sets - 10 reps - 3-sec hold  Added 11/16/2021: - Seated Upper Trapezius Stretch  - 1 x daily - 7 x weekly - 2 sets - 1-min hold - Seated Levator Scapulae Stretch  - 1 x daily - 7 x weekly - 2 sets - 1-min hold  Added 12/03/2021: - Seated Assisted Cervical Rotation with Towel  - 1 x daily - 7 x weekly - 3 sets - 10 reps - Cervical Extension AROM with Strap  - 1 x daily - 7 x weekly - 3 sets - 10 reps   ASSESSMENT:   CLINICAL IMPRESSION: Patient presents to PT with continued pain in her upper traps and neck and reports HEP compliance. She reports she was sore after the last TPDN session but that her pain was lessened overall. Added SNAGS into HEP with patient demonstrating understanding of each. Patient was able to tolerate all prescribed exercises with no adverse effects. Patient continues to benefit from skilled PT services and should be progressed as able to improve functional independence.      OBJECTIVE IMPAIRMENTS Abnormal gait, decreased balance, decreased mobility, difficulty walking, decreased ROM, decreased strength, hypomobility, increased edema, increased muscle spasms, impaired flexibility, improper body mechanics, postural dysfunction, and pain.    ACTIVITY LIMITATIONS carrying, lifting, bending, sitting, standing, squatting, sleeping, stairs, transfers, and locomotion level   PARTICIPATION LIMITATIONS: meal prep, cleaning, laundry, interpersonal relationship, driving, shopping, community activity, occupation, and yard work    PERSONAL FACTORS Past/current experiences, Time since onset of injury/illness/exacerbation, and 3+ comorbidities: See medical hx  are also affecting patient's functional outcome.        GOALS: Goals reviewed with patient? Yes   SHORT TERM GOALS: Target date: 11/22/2021  Pt will report understanding and adherence to initial HEP in order to promote independence in the management of primary impairments. Baseline: HEP provided at eval Goal status: MET Pt reports adherence 12/03/21     LONG TERM GOALS: Target date: 12/20/2021    Pt will achieve a cervical FOTO score of 67% in order to demonstrate improved functional ability as it relates to her primary impairments. Baseline: 59% Goal status: INITIAL   2.  Pt will achieve BIL global parascapular strength of 4+/5 or higher in order to promote improved posture and prophylaxis of future mechanical neck pain Baseline: 3/5 global parascapular strength Goal status: INITIAL   3.  Pt will achieve global cervical AROM WNL and with 0-2/10 pain in order to get dressed with less limitation. Baseline: See AROM chart Goal status: INITIAL   4.  Pt will report ability to complete a full work day, including looking down at her computer screen with 0-3/10 pain in order to complete her work duties with less limitation. Baseline: Pt has >6/10 pain at the end of her work day Goal status: INITIAL       PLAN: PT FREQUENCY: 1x/week   PT DURATION: 8 weeks   PLANNED INTERVENTIONS: Therapeutic exercises, Therapeutic activity, Neuromuscular re-education, Balance training, Gait training, Patient/Family education, Joint mobilization, Stair training, Aquatic Therapy, Dry Needling, Electrical stimulation, Spinal mobilization, Cryotherapy, Moist heat, Taping, Vasopneumatic device, Traction, Biofeedback, Ionotophoresis 67m/ml Dexamethasone, Manual therapy, and Re-evaluation   PLAN FOR NEXT SESSION: Progress DNF/ parascapular strength/ manual techniques for pain (no  OMT due to EDS)    SEvelene Croon PTA 12/03/21 11:13 AM

## 2021-12-04 DIAGNOSIS — F603 Borderline personality disorder: Secondary | ICD-10-CM | POA: Diagnosis not present

## 2021-12-04 DIAGNOSIS — G894 Chronic pain syndrome: Secondary | ICD-10-CM | POA: Diagnosis not present

## 2021-12-04 DIAGNOSIS — F3341 Major depressive disorder, recurrent, in partial remission: Secondary | ICD-10-CM | POA: Diagnosis not present

## 2021-12-04 DIAGNOSIS — F411 Generalized anxiety disorder: Secondary | ICD-10-CM | POA: Diagnosis not present

## 2021-12-05 DIAGNOSIS — G894 Chronic pain syndrome: Secondary | ICD-10-CM | POA: Diagnosis not present

## 2021-12-05 DIAGNOSIS — F411 Generalized anxiety disorder: Secondary | ICD-10-CM | POA: Diagnosis not present

## 2021-12-05 DIAGNOSIS — K3184 Gastroparesis: Secondary | ICD-10-CM | POA: Diagnosis not present

## 2021-12-05 DIAGNOSIS — E611 Iron deficiency: Secondary | ICD-10-CM | POA: Diagnosis not present

## 2021-12-05 DIAGNOSIS — Q7962 Hypermobile Ehlers-Danlos syndrome: Secondary | ICD-10-CM | POA: Diagnosis not present

## 2021-12-05 DIAGNOSIS — R42 Dizziness and giddiness: Secondary | ICD-10-CM | POA: Diagnosis not present

## 2021-12-05 DIAGNOSIS — F3341 Major depressive disorder, recurrent, in partial remission: Secondary | ICD-10-CM | POA: Diagnosis not present

## 2021-12-05 DIAGNOSIS — M797 Fibromyalgia: Secondary | ICD-10-CM | POA: Diagnosis not present

## 2021-12-08 ENCOUNTER — Ambulatory Visit: Payer: BC Managed Care – PPO

## 2021-12-08 DIAGNOSIS — M542 Cervicalgia: Secondary | ICD-10-CM | POA: Diagnosis not present

## 2021-12-08 DIAGNOSIS — M25551 Pain in right hip: Secondary | ICD-10-CM

## 2021-12-08 DIAGNOSIS — M6281 Muscle weakness (generalized): Secondary | ICD-10-CM

## 2021-12-08 DIAGNOSIS — R262 Difficulty in walking, not elsewhere classified: Secondary | ICD-10-CM | POA: Diagnosis not present

## 2021-12-08 NOTE — Therapy (Signed)
OUTPATIENT PHYSICAL THERAPY TREATMENT NOTE   Patient Name: Brianna [name] MRN: [id number] DOB:14-Feb-1996, 26 y.o., female Today's Date: 12/08/2021  PCP: Josephina Shih, MD REFERRING PROVIDER: Vanetta Mulders, MD  END OF SESSION:   PT End of Session - 12/08/21 1753     Visit Number 7    Number of Visits 9    Date for PT Re-Evaluation 12/27/21    Authorization Type BCBS    Authorization Time Period FOTO v6, v10    PT Start Time 1743    PT Stop Time 1828   6 minutes dry needle insertion   PT Time Calculation (min) 45 min    Activity Tolerance Patient tolerated treatment well    Behavior During Therapy [facility] for tasks assessed/performed                 Past Medical History:  Diagnosis Date   Acute recurrent pansinusitis 10/21/2019   ADHD (attention deficit hyperactivity disorder)    inattentive   Anemia    Anxiety    Asthma    Bronchitis    Cat allergy due to both airborne and skin contact 05/01/2016   Chronic sore throat 05/01/2016   COVID 2020   Depression    Fibromyalgia    Gastroparesis    GERD (gastroesophageal reflux disease)    Hypermobile Ehlers-Danlos syndrome    Irregular heart rate    Keratosis pilaris    Migraine    Seasonal allergic rhinitis 03/11/2014   Past Surgical History:  Procedure Laterality Date   ESOPHAGOGASTRODUODENOSCOPY ENDOSCOPY     03/2020   JOINT REPLACEMENT     LABRAL REPAIR Right 03/02/2021   Procedure: RIGHT HIP ARTHROSCOPY WITH LABRAL REPAIR AND PINCE DEBRIDEMENT;  Surgeon: Vanetta Mulders, MD;  Location: [place];  Service: Orthopedics;  Laterality: Right;   SMART PILL PROCEDURE     12/2020   TYMPANOSTOMY TUBE PLACEMENT Bilateral    WISDOM TOOTH EXTRACTION     Patient Active Problem List   Diagnosis Date Noted   Cervical adenopathy 06/10/2021   Chronic pruritus 06/10/2021   Exercise-induced asthma 06/09/2021   Chronic venous insufficiency 06/03/2021   Orthostatic lightheadedness 06/03/2021   Purple toe  syndrome of both feet ([place]) 06/03/2021   Autonomic dysfunction 06/03/2021   Labral tear of hip, degenerative 04/13/2021   Migraine headache 03/01/2021   Headache 11/30/2020   Positive ANA (antinuclear antibody) 11/15/2020   Fibromyalgia 11/02/2020   Hair loss 11/02/2020   Muscle spasticity 10/22/2020   Hypermobile Ehlers-Danlos syndrome 10/22/2020   Paresthesia of bilateral legs 10/06/2020   Myalgia 10/06/2020   Chronic pelvic pain in female 10/06/2020   Chronic abdominal pain 10/06/2020   Irritable colon 08/02/2020   Pelvic floor dysfunction 08/02/2020   Irritant dermatitis 08/02/2020   Gastroesophageal reflux disease 06/28/2020   Severe recurrent major depression without psychotic features ([place]) 06/28/2020   Polyarthralgia 06/28/2020   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 06/28/2020   Tachycardia 06/28/2020   History of COVID-19 06/28/2020   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 deficit 06/28/2020   Recurrent major depressive disorder, in full remission ([place]) 06/28/2020   Atypical chest pain 06/22/2020   Dizziness 06/22/2020   Palpitations 06/14/2020   Anxiety 05/04/2020   Internal and external hemorrhoids without complication 40/98/1191   Nondiabetic gastroparesis 03/03/2020   ETD (Eustachian tube dysfunction), bilateral 10/21/2019   Acne vulgaris 03/11/2014   Mild intermittent asthma without complication 47/82/9562   Seasonal allergies 03/11/2014    REFERRING DIAG: M25.551 (ICD-10-CM) - Pain in  right hip  THERAPY DIAG:  Neck pain  Pain in right hip  Muscle weakness (generalized)  Difficulty in walking, not elsewhere classified  Rationale for Evaluation and Treatment Rehabilitation  PERTINENT HISTORY: 03/02/2021: RIGHT HIP ARTHROSCOPY WITH LABRAL REPAIR AND PINCE DEBRIDEMENT, anxiety, depression, fibromyalgia, asthma, Symptomatic Ehler's-Danlos syndrome  PRECAUTIONS: 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 She reports that she experienced increased pain after TPDN last time, but says she has made good improvements overall. She opts to have TPDN again today.   PAIN:  Are you having pain? Yes: NPRS scale: 7/10 Pain location: Posterior neck/ upper back Pain description: Achy, sharp Aggravating factors: BIL cervical rotation, looking down, looking down at computer screen/ phone Relieving factors: [employer]   OBJECTIVE: (objective measures completed at initial evaluation unless otherwise dated)   DIAGNOSTIC FINDINGS: MR Hip Right with contrast: IMPRESSION: 1. Full-thickness tear of the right anterior superior labrum.   PATIENT SURVEYS:  FOTO Cervical: 59%, predicted 67% in 11 visits; Hip: 58%, predicted 71% in 13 visits 12/08/2021: Cervical: 51%; Hip: 61%   COGNITION:           Overall cognitive status: Within functional limits for tasks assessed                          SENSATION: Not tested     POSTURE:  Forward head/ BIL rounded shoulders   PALPATION: TTP with palpable trigger points to BIL cervical paraspinals/ UT/ mid trap           PASSIVE ACCESSORIES: Hypermobile/ painful CPAs C2-C3, C5-C7 Hypomobile/ painful CPAs C4-C5, T1-T8   CERVICAL ROM:    AROM A/PROM (deg) 10/25/2021 AROM 11/16/2021  Flexion 40p! 68  Extension 65p!! 42p!  Right lateral flexion 45 45  Left lateral flexion 45 45  Right rotation 61p! 81  Left rotation 68p! 81   (Blank rows = not tested)   UE ROM:   AROM Right 10/25/2021 Left 10/25/2021  Shoulder flexion WNL WNL  Shoulder abduction WNL WNL   (Blank rows = not tested)   UE MMT:   MMT Right 10/25/2021 Left 10/25/2021 Right 12/08/2021 Left 12/08/2021  Shoulder flexion 5/5 5/5    Shoulder abduction 5/5 5/5    Cervical flexion 4/5p!   Cervical extension 4/5p!   Cervical side bend 5/5 5/5    Shoulder IR 5/5 5/5    Shoulder ER 5/5 5/5    Mid trap 3/5 3/5 4/5 4/5  Low trap 3/5 3/5 4/5 4/5  Latissimus dorsi 3/5 3/5 4+/5  4+/5   (Blank rows = not tested)   CERVICAL SPECIAL TESTS:  Cervical distraction: (-) Cervical compression: (+) Quadrant testing: (+) BIL     FUNCTIONAL TESTS:  DNF endurance test: 15 seconds       TODAY'S TREATMENT:  [facility] Adult PT Treatment:                                                DATE: 12/08/2021 Therapeutic Exercise: Forward lunge push-pull with two 7# cables at [employer] machine 2x10 BIL Standing shoulder rolls 2x10 forward and backward Standing low rows with 7# cables 3x10 Manual Therapy: Skilled palpation to identify trigger points prior to TPDN x3 minutes Effleurage to BIL UT/ suboccipitals/ MT following TPDN x5 minutes Supine suboccipital release x8  minutes Neuromuscular re-ed: N/A Therapeutic Activity: N/A Modalities: N/A Self Care: N/A  Trigger Point Dry-Needling  Treatment instructions: Expect mild to moderate muscle soreness. S/S of pneumothorax if dry needled over a lung field, and to seek immediate medical attention should they occur. Patient verbalized understanding of these instructions and education.  Patient Consent Given: Yes Education handout provided: Previously Muscles treated: BIL UT, BIL suboccipital musculature, Rt mid trap Electrical stimulation performed: No Parameters: N/A Treatment response/outcome: Multiple twitch responses, improved muscle extensibility, improved pain  [facility] Adult PT Treatment:                                                DATE: 12/03/2021 Therapeutic Exercise: Forward lunge push-pull with two 10# cables at [employer] machine 2x10 BIL Standing alternating shoulder scaption with 5# dumbbells 2x15 BIL Standing shoulder extension with two 7# cables 2x10 Doorway pec stretch 3x30" Seated low rows with 30# cable 2x10 Seated high rows with 30# cable 2x10 Seated lat pulldowns with 30# cable 2x10 Seated BIL shoulder rolls 2x10 forward and backward  Seated UT stretch x68mn BIL Seated levator scap stretch x1 min  BIL SNAGS rotation and cervical extension x10 each   [facility] Adult PT Treatment:                                                DATE: 11/23/2021 Therapeutic Exercise: Forward lunge push-pull with two 10# cables at [employer] 2x10 BIL Standing low rows with 7# cables 3x10 Standing alternating shoulder scaption with 5# dumbbells 2x15 BIL Standing corner pec stretch x281m Manual Therapy: Skilled palpation to identify trigger points prior to TPDN Effleurage to BIL UT/ suboccipitals/ MT following TPDN Neuromuscular re-ed: N/A Therapeutic Activity: N/A Modalities: N/A Self Care: N/A  Trigger Point Dry-Needling  Treatment instructions: Expect mild to moderate muscle soreness. S/S of pneumothorax if dry needled over a lung field, and to seek immediate medical attention should they occur. Patient verbalized understanding of these instructions and education.  Patient Consent Given: Yes Education handout provided: Previously Muscles treated: BIL UT, BIL suboccipital musculature, Rt mid trap Electrical stimulation performed: No Parameters: N/A Treatment response/outcome: Multiple twitch responses, improved muscle extensibility, improved pain      HOME EXERCISE PROGRAM: Access Code: [email or web address]: [email or web address] Date: 10/25/2021 Prepared by: TuVanessa [place] Exercises - Seated Cervical Retraction  - 1 x daily - 7 x weekly - 3 sets - 10 reps - 3-sec hold - Standing Shoulder Row with Anchored Resistance  - 1 x daily - 7 x weekly - 3 sets - 10 reps - 3-sec hold - Low trap slides at wall with lift-off  - 1 x daily - 7 x weekly - 3 sets - 10 reps - 3-sec hold - Shoulder Extension with Resistance  - 1 x daily - 7 x weekly - 3 sets - 10 reps - 3-sec hold  Added 11/16/2021: - Seated Upper Trapezius Stretch  - 1 x daily - 7 x weekly - 2 sets - 1-min hold - Seated Levator Scapulae Stretch  - 1 x daily - 7 x weekly - 2 sets - 1-min hold  Added 12/03/2021: - Seated  Assisted Cervical Rotation with Towel  - 1 x daily - 7  x weekly - 3 sets - 10 reps - Cervical Extension AROM with Strap  - 1 x daily - 7 x weekly - 3 sets - 10 reps   ASSESSMENT:   CLINICAL IMPRESSION: Pt responded well to all interventions today. She reports a therapeutic response to TPDN and manual techniques today, reporting improved pain from 7/10 to 4/10 following interventions. She will continue to benefit from skilled PT to address her primary impairments and return to her prior level of function with less limitation.     OBJECTIVE IMPAIRMENTS Abnormal gait, decreased balance, decreased mobility, difficulty walking, decreased ROM, decreased strength, hypomobility, increased edema, increased muscle spasms, impaired flexibility, improper body mechanics, postural dysfunction, and pain.    ACTIVITY LIMITATIONS carrying, lifting, bending, sitting, standing, squatting, sleeping, stairs, transfers, and locomotion level   PARTICIPATION LIMITATIONS: meal prep, cleaning, laundry, interpersonal relationship, driving, shopping, community activity, occupation, and yard work   PERSONAL FACTORS Past/current experiences, Time since onset of injury/illness/exacerbation, and 3+ comorbidities: See medical hx  are also affecting patient's functional outcome.        GOALS: Goals reviewed with patient? Yes   SHORT TERM GOALS: Target date: 11/22/2021  Pt will report understanding and adherence to initial HEP in order to promote independence in the management of primary impairments. Baseline: HEP provided at eval Goal status: MET Pt reports adherence 12/03/21     LONG TERM GOALS: Target date: 12/20/2021    Pt will achieve a cervical FOTO score of 67% in order to demonstrate improved functional ability as it relates to her primary impairments. Baseline: 59% 12/08/2021: 51% Goal status: IN PROGRESS   2.  Pt will achieve BIL global parascapular strength of 4+/5 or higher in order to promote improved  posture and prophylaxis of future mechanical neck pain Baseline: 3/5 global parascapular strength 12/08/2021: See updated MMT chart Goal status: IN PROGRESS   3.  Pt will achieve global cervical AROM WNL and with 0-2/10 pain in order to get dressed with less limitation. Baseline: See AROM chart 11/16/2021: See updated AROM chart Goal status: IN PROGRESS   4.  Pt will report ability to complete a full work day, including looking down at her computer screen with 0-3/10 pain in order to complete her work duties with less limitation. Baseline: Pt has >6/10 pain at the end of her work day Goal status: INITIAL       PLAN: PT FREQUENCY: 1x/week   PT DURATION: 8 weeks   PLANNED INTERVENTIONS: Therapeutic exercises, Therapeutic activity, Neuromuscular re-education, Balance training, Gait training, Patient/Family education, Joint mobilization, Stair training, Aquatic Therapy, Dry Needling, Electrical stimulation, Spinal mobilization, Cryotherapy, Moist heat, Taping, Vasopneumatic device, Traction, Biofeedback, Ionotophoresis 66m/ml Dexamethasone, Manual therapy, and Re-evaluation   PLAN FOR NEXT SESSION: Progress DNF/ parascapular strength/ manual techniques for pain (no OMT due to EDS)   YVanessa [place] PT, DPT 12/08/21 6:30 PM

## 2021-12-14 DIAGNOSIS — G894 Chronic pain syndrome: Secondary | ICD-10-CM | POA: Diagnosis not present

## 2021-12-14 DIAGNOSIS — F411 Generalized anxiety disorder: Secondary | ICD-10-CM | POA: Diagnosis not present

## 2021-12-14 DIAGNOSIS — F603 Borderline personality disorder: Secondary | ICD-10-CM | POA: Diagnosis not present

## 2021-12-14 DIAGNOSIS — F3341 Major depressive disorder, recurrent, in partial remission: Secondary | ICD-10-CM | POA: Diagnosis not present

## 2021-12-15 DIAGNOSIS — F3341 Major depressive disorder, recurrent, in partial remission: Secondary | ICD-10-CM | POA: Diagnosis not present

## 2021-12-15 DIAGNOSIS — F5081 Binge eating disorder: Secondary | ICD-10-CM | POA: Diagnosis not present

## 2021-12-15 DIAGNOSIS — F411 Generalized anxiety disorder: Secondary | ICD-10-CM | POA: Diagnosis not present

## 2021-12-15 DIAGNOSIS — G894 Chronic pain syndrome: Secondary | ICD-10-CM | POA: Diagnosis not present

## 2021-12-16 ENCOUNTER — Encounter: Payer: Self-pay | Admitting: Physical Medicine and Rehabilitation

## 2021-12-17 ENCOUNTER — Other Ambulatory Visit: Payer: Self-pay | Admitting: Physical Medicine and Rehabilitation

## 2021-12-17 DIAGNOSIS — G4700 Insomnia, unspecified: Secondary | ICD-10-CM

## 2021-12-21 DIAGNOSIS — F411 Generalized anxiety disorder: Secondary | ICD-10-CM | POA: Diagnosis not present

## 2021-12-21 DIAGNOSIS — F3341 Major depressive disorder, recurrent, in partial remission: Secondary | ICD-10-CM | POA: Diagnosis not present

## 2021-12-21 DIAGNOSIS — F5081 Binge eating disorder: Secondary | ICD-10-CM | POA: Diagnosis not present

## 2021-12-21 DIAGNOSIS — G894 Chronic pain syndrome: Secondary | ICD-10-CM | POA: Diagnosis not present

## 2021-12-22 ENCOUNTER — Ambulatory Visit: Payer: BC Managed Care – PPO

## 2021-12-22 DIAGNOSIS — R519 Headache, unspecified: Secondary | ICD-10-CM | POA: Diagnosis not present

## 2021-12-22 DIAGNOSIS — J029 Acute pharyngitis, unspecified: Secondary | ICD-10-CM | POA: Diagnosis not present

## 2021-12-23 ENCOUNTER — Encounter: Payer: Self-pay | Admitting: Obstetrics and Gynecology

## 2021-12-26 ENCOUNTER — Other Ambulatory Visit: Payer: Self-pay

## 2021-12-26 DIAGNOSIS — F411 Generalized anxiety disorder: Secondary | ICD-10-CM | POA: Diagnosis not present

## 2021-12-26 DIAGNOSIS — B009 Herpesviral infection, unspecified: Secondary | ICD-10-CM

## 2021-12-26 NOTE — Progress Notes (Signed)
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

## 2021-12-27 ENCOUNTER — Ambulatory Visit: Payer: BC Managed Care – PPO

## 2021-12-27 DIAGNOSIS — B009 Herpesviral infection, unspecified: Secondary | ICD-10-CM | POA: Diagnosis not present

## 2021-12-28 LAB — HSV 2 ANTIBODY, IGG: HSV 2 IgG, Type Spec: <0.91 index (ref 0.00–0.90)

## 2021-12-28 LAB — HSV 1 ANTIBODY, IGG: HSV 1 Glycoprotein G Ab, IgG: <0.91 index (ref 0.00–0.90)

## 2021-12-29 DIAGNOSIS — M797 Fibromyalgia: Secondary | ICD-10-CM | POA: Diagnosis not present

## 2021-12-29 DIAGNOSIS — Q796 Ehlers-Danlos syndrome, unspecified: Secondary | ICD-10-CM | POA: Diagnosis not present

## 2021-12-29 DIAGNOSIS — F5081 Binge eating disorder: Secondary | ICD-10-CM | POA: Diagnosis not present

## 2021-12-29 DIAGNOSIS — G894 Chronic pain syndrome: Secondary | ICD-10-CM | POA: Diagnosis not present

## 2021-12-29 DIAGNOSIS — F3341 Major depressive disorder, recurrent, in partial remission: Secondary | ICD-10-CM | POA: Diagnosis not present

## 2021-12-29 DIAGNOSIS — F411 Generalized anxiety disorder: Secondary | ICD-10-CM | POA: Diagnosis not present

## 2022-01-01 DIAGNOSIS — F3341 Major depressive disorder, recurrent, in partial remission: Secondary | ICD-10-CM | POA: Diagnosis not present

## 2022-01-01 DIAGNOSIS — G894 Chronic pain syndrome: Secondary | ICD-10-CM | POA: Diagnosis not present

## 2022-01-01 DIAGNOSIS — F411 Generalized anxiety disorder: Secondary | ICD-10-CM | POA: Diagnosis not present

## 2022-01-01 DIAGNOSIS — F5081 Binge eating disorder: Secondary | ICD-10-CM | POA: Diagnosis not present

## 2022-01-03 ENCOUNTER — Ambulatory Visit: Payer: BC Managed Care – PPO | Attending: Student in an Organized Health Care Education/Training Program

## 2022-01-03 DIAGNOSIS — M24159 Other articular cartilage disorders, unspecified hip: Secondary | ICD-10-CM | POA: Insufficient documentation

## 2022-01-03 DIAGNOSIS — M542 Cervicalgia: Secondary | ICD-10-CM | POA: Insufficient documentation

## 2022-01-03 DIAGNOSIS — M25551 Pain in right hip: Secondary | ICD-10-CM | POA: Diagnosis not present

## 2022-01-03 DIAGNOSIS — R262 Difficulty in walking, not elsewhere classified: Secondary | ICD-10-CM | POA: Insufficient documentation

## 2022-01-03 DIAGNOSIS — M6281 Muscle weakness (generalized): Secondary | ICD-10-CM | POA: Diagnosis not present

## 2022-01-03 DIAGNOSIS — M357 Hypermobility syndrome: Secondary | ICD-10-CM | POA: Insufficient documentation

## 2022-01-03 NOTE — Addendum Note (Signed)
Addended by: Sigmund Hazel on: 01/03/2022 07:11 PM   Modules accepted: Orders

## 2022-01-03 NOTE — Therapy (Signed)
OUTPATIENT PHYSICAL THERAPY TREATMENT NOTE   Patient Name: Brianna [name] MRN: [id number] DOB:March 18, 1996, 26 y.o., female Today's Date: 01/03/2022  PCP: Josephina Shih, MD REFERRING PROVIDER: Vanetta Mulders, MD  END OF SESSION:   PT End of Session - 01/03/22 1844     Visit Number 8    Number of Visits 14    Date for PT Re-Evaluation 02/21/22    Authorization Type BCBS    Authorization Time Period FOTO v6, v10    PT Start Time 3299    PT Stop Time 1907    PT Time Calculation (min) 40 min    Activity Tolerance Patient tolerated treatment well    Behavior During Therapy [facility] for tasks assessed/performed                  Past Medical History:  Diagnosis Date   Acute recurrent pansinusitis 10/21/2019   ADHD (attention deficit hyperactivity disorder)    inattentive   Anemia    Anxiety    Asthma    Bronchitis    Cat allergy due to both airborne and skin contact 05/01/2016   Chronic sore throat 05/01/2016   COVID 2020   Depression    Fibromyalgia    Gastroparesis    GERD (gastroesophageal reflux disease)    Hypermobile Ehlers-Danlos syndrome    Irregular heart rate    Keratosis pilaris    Migraine    Seasonal allergic rhinitis 03/11/2014   Past Surgical History:  Procedure Laterality Date   ESOPHAGOGASTRODUODENOSCOPY ENDOSCOPY     03/2020   JOINT REPLACEMENT     LABRAL REPAIR Right 03/02/2021   Procedure: RIGHT HIP ARTHROSCOPY WITH LABRAL REPAIR AND PINCE DEBRIDEMENT;  Surgeon: Vanetta Mulders, MD;  Location: [place];  Service: Orthopedics;  Laterality: Right;   SMART PILL PROCEDURE     12/2020   TYMPANOSTOMY TUBE PLACEMENT Bilateral    WISDOM TOOTH EXTRACTION     Patient Active Problem List   Diagnosis Date Noted   Cervical adenopathy 06/10/2021   Chronic pruritus 06/10/2021   Exercise-induced asthma 06/09/2021   Chronic venous insufficiency 06/03/2021   Orthostatic lightheadedness 06/03/2021   Purple toe syndrome of both feet ([place])  06/03/2021   Autonomic dysfunction 06/03/2021   Labral tear of hip, degenerative 04/13/2021   Migraine headache 03/01/2021   Headache 11/30/2020   Positive ANA (antinuclear antibody) 11/15/2020   Fibromyalgia 11/02/2020   Hair loss 11/02/2020   Muscle spasticity 10/22/2020   Hypermobile Ehlers-Danlos syndrome 10/22/2020   Paresthesia of bilateral legs 10/06/2020   Myalgia 10/06/2020   Chronic pelvic pain in female 10/06/2020   Chronic abdominal pain 10/06/2020   Irritable colon 08/02/2020   Pelvic floor dysfunction 08/02/2020   Irritant dermatitis 08/02/2020   Gastroesophageal reflux disease 06/28/2020   Severe recurrent major depression without psychotic features ([place]) 06/28/2020   Polyarthralgia 06/28/2020   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 06/28/2020   Tachycardia 06/28/2020   History of COVID-19 06/28/2020   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 deficit 06/28/2020   Recurrent major depressive disorder, in full remission ([place]) 06/28/2020   Atypical chest pain 06/22/2020   Dizziness 06/22/2020   Palpitations 06/14/2020   Anxiety 05/04/2020   Internal and external hemorrhoids without complication 24/26/8341   Nondiabetic gastroparesis 03/03/2020   ETD (Eustachian tube dysfunction), bilateral 10/21/2019   Acne vulgaris 03/11/2014   Mild intermittent asthma without complication 96/22/2979   Seasonal allergies 03/11/2014    REFERRING DIAG: M25.551 (ICD-10-CM) - Pain in right hip  THERAPY DIAG:  Neck pain  Pain in right hip  Muscle weakness (generalized)  Difficulty in walking, not elsewhere classified  Rationale for Evaluation and Treatment Rehabilitation  PERTINENT HISTORY: 03/02/2021: RIGHT HIP ARTHROSCOPY WITH LABRAL REPAIR AND PINCE DEBRIDEMENT, anxiety, depression, fibromyalgia, asthma, Symptomatic Ehler's-Danlos syndrome  PRECAUTIONS: 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 She reports increased limping due to pain. Pt also reports pain at the base of her neck and Lt mid trap.  PAIN:  Are you having pain? Yes: NPRS scale: 8/10 Pain location: Posterior neck/ upper back Pain description: Achy, sharp Aggravating factors: BIL cervical rotation, looking down, looking down at computer screen/ phone Relieving factors: Icy[employer]   OBJECTIVE: (objective measures completed at initial evaluation unless otherwise dated)   DIAGNOSTIC FINDINGS: MR Hip Right with contrast: IMPRESSION: 1. Full-thickness tear of the right anterior superior labrum.   PATIENT SURVEYS:  FOTO Cervical: 59%, predicted 67% in 11 visits; Hip: 58%, predicted 71% in 13 visits 12/08/2021: Cervical: 51%; Hip: 61%   COGNITION:           Overall cognitive status: Within functional limits for tasks assessed                          SENSATION: Not tested     POSTURE:  Forward head/ BIL rounded shoulders   PALPATION: TTP with palpable trigger points to BIL cervical paraspinals/ UT/ mid trap           PASSIVE ACCESSORIES: Hypermobile/ painful CPAs C2-C3, C5-C7 Hypomobile/ painful CPAs C4-C5, T1-T8   CERVICAL ROM:    AROM A/PROM (deg) 10/25/2021 AROM 11/16/2021 AROM 01/03/2022  Flexion 40p! 68   Extension 65p!! 42p! 60, minor pain  Right lateral flexion 45 45   Left lateral flexion 45 45   Right rotation 61p! 81   Left rotation 68p! 81    (Blank rows = not tested)   UE ROM:   AROM Right 10/25/2021 Left 10/25/2021  Shoulder flexion WNL WNL  Shoulder abduction WNL WNL   (Blank rows = not tested)   UE MMT:   MMT Right 10/25/2021 Left 10/25/2021 Right 12/08/2021 Left 12/08/2021 Right 01/03/2022 Left 01/03/2022  Shoulder flexion 5/5 5/5      Shoulder abduction 5/5 5/5      Cervical flexion 4/5p!     Cervical extension 4/5p!     Cervical side bend 5/5 5/5      Shoulder IR 5/5 5/5      Shoulder ER 5/5 5/5      Mid trap 3/5 3/5 4/5 4/5 4+/5 4+/5  Low trap 3/5 3/5 4/5 4/5  4+/5 4+/5  Latissimus dorsi 3/5 3/5 4+/5 4+/5 4+/5 4+/5   (Blank rows = not tested)   CERVICAL SPECIAL TESTS:  Cervical distraction: (-) Cervical compression: (+) Quadrant testing: (+) BIL     FUNCTIONAL TESTS:  DNF endurance test: 15 seconds    01/03/2022: DNF endurance: 35 seconds     TODAY'S TREATMENT:  [facility] Adult PT Treatment:                                                DATE: 01/03/2022 Therapeutic Exercise: Forward lunge push-pull with two 7# cables at [employer] machine 2x10 BIL Standing arm circle angels 2x5 Standing BIL shoulder extension  with 7# cables 3x10 Manual Therapy: Seated pin and stretch to BIL UT 2x30 seconds BIL Seated Effleurage/ STM to BIL UT x8 minutes Neuromuscular re-ed: N/A Therapeutic Activity: Re-assessment of objective measures with pt education Modalities: N/A Self Care: N/A   [facility] Adult PT Treatment:                                                DATE: 12/08/2021 Therapeutic Exercise: Forward lunge push-pull with two 7# cables at [employer] machine 2x10 BIL Standing shoulder rolls 2x10 forward and backward Standing low rows with 7# cables 3x10 Manual Therapy: Skilled palpation to identify trigger points prior to TPDN x3 minutes Effleurage to BIL UT/ suboccipitals/ MT following TPDN x5 minutes Supine suboccipital release x8 minutes Neuromuscular re-ed: N/A Therapeutic Activity: N/A Modalities: N/A Self Care: N/A  Trigger Point Dry-Needling  Treatment instructions: Expect mild to moderate muscle soreness. S/S of pneumothorax if dry needled over a lung field, and to seek immediate medical attention should they occur. Patient verbalized understanding of these instructions and education.  Patient Consent Given: Yes Education handout provided: Previously Muscles treated: BIL UT, BIL suboccipital musculature, Rt mid trap Electrical stimulation performed: No Parameters: N/A Treatment response/outcome: Multiple twitch responses,  improved muscle extensibility, improved pain  [facility] Adult PT Treatment:                                                DATE: 12/03/2021 Therapeutic Exercise: Forward lunge push-pull with two 10# cables at [employer] machine 2x10 BIL Standing alternating shoulder scaption with 5# dumbbells 2x15 BIL Standing shoulder extension with two 7# cables 2x10 Doorway pec stretch 3x30" Seated low rows with 30# cable 2x10 Seated high rows with 30# cable 2x10 Seated lat pulldowns with 30# cable 2x10 Seated BIL shoulder rolls 2x10 forward and backward  Seated UT stretch x52mn BIL Seated levator scap stretch x1 min BIL SNAGS rotation and cervical extension x10 each       HOME EXERCISE PROGRAM: Access Code: [email or web address]: [email or web address] Date: 10/25/2021 Prepared by: TVanessa Sutter  Exercises - Seated Cervical Retraction  - 1 x daily - 7 x weekly - 3 sets - 10 reps - 3-sec hold - Standing Shoulder Row with Anchored Resistance  - 1 x daily - 7 x weekly - 3 sets - 10 reps - 3-sec hold - Low trap slides at wall with lift-off  - 1 x daily - 7 x weekly - 3 sets - 10 reps - 3-sec hold - Shoulder Extension with Resistance  - 1 x daily - 7 x weekly - 3 sets - 10 reps - 3-sec hold  Added 11/16/2021: - Seated Upper Trapezius Stretch  - 1 x daily - 7 x weekly - 2 sets - 1-min hold - Seated Levator Scapulae Stretch  - 1 x daily - 7 x weekly - 2 sets - 1-min hold  Added 12/03/2021: - Seated Assisted Cervical Rotation with Towel  - 1 x daily - 7 x weekly - 3 sets - 10 reps - Cervical Extension AROM with Strap  - 1 x daily - 7 x weekly - 3 sets - 10 reps   ASSESSMENT:   CLINICAL IMPRESSION: Pt responded well to all interventions today, demonstrating  good form and no increase in pain with selected exercises. She reports a therapeutic response to manual techniques today. Upon re-assessment of objective measures, the pt has made good progress with parascapular strength and cervical  extension ROM. She continues to be limited by pain and will continue to benefit from skilled PT to address her primary impairments and return to her prior level of function with less limitation.     OBJECTIVE IMPAIRMENTS Abnormal gait, decreased balance, decreased mobility, difficulty walking, decreased ROM, decreased strength, hypomobility, increased edema, increased muscle spasms, impaired flexibility, improper body mechanics, postural dysfunction, and pain.    ACTIVITY LIMITATIONS carrying, lifting, bending, sitting, standing, squatting, sleeping, stairs, transfers, and locomotion level   PARTICIPATION LIMITATIONS: meal prep, cleaning, laundry, interpersonal relationship, driving, shopping, community activity, occupation, and yard work   PERSONAL FACTORS Past/current experiences, Time since onset of injury/illness/exacerbation, and 3+ comorbidities: See medical hx  are also affecting patient's functional outcome.        GOALS: Goals reviewed with patient? Yes   SHORT TERM GOALS: Target date: 11/22/2021  Pt will report understanding and adherence to initial HEP in order to promote independence in the management of primary impairments. Baseline: HEP provided at eval Goal status: MET Pt reports adherence 12/03/21     LONG TERM GOALS: Target date: 12/20/2021    Pt will achieve a cervical FOTO score of 67% in order to demonstrate improved functional ability as it relates to her primary impairments. Baseline: 59% 12/08/2021: 51% Goal status: IN PROGRESS   2.  Pt will achieve BIL global parascapular strength of 4+/5 or higher in order to promote improved posture and prophylaxis of future mechanical neck pain Baseline: 3/5 global parascapular strength 12/08/2021: See updated MMT chart 01/03/2022: See updated MMT chart Goal status: ACHIEVED   3.  Pt will achieve global cervical AROM WNL and with 0-2/10 pain in order to get dressed with less limitation. Baseline: See AROM chart 11/16/2021: See  updated AROM chart 01/03/2022: See updated AROM (4/10 pain with extension) Goal status: IN PROGRESS   4.  Pt will report ability to complete a full work day, including looking down at her computer screen with 0-3/10 pain in order to complete her work duties with less limitation. Baseline: Pt has >6/10 pain at the end of her work day 01/03/2022: Pt reports average daily pain at work of 5-6/10 Goal status: IN PROGRESS   5.  *New 01/03/2022* Pt will report ability to sleep through the night without being waken due to pain.   Baseline: Pt woken 5x per night   Goal status: INITIAL       PLAN: PT FREQUENCY: 1x/week   PT DURATION: 6 weeks   PLANNED INTERVENTIONS: Therapeutic exercises, Therapeutic activity, Neuromuscular re-education, Balance training, Gait training, Patient/Family education, Joint mobilization, Stair training, Aquatic Therapy, Dry Needling, Electrical stimulation, Spinal mobilization, Cryotherapy, Moist heat, Taping, Vasopneumatic device, Traction, Biofeedback, Ionotophoresis 50m/ml Dexamethasone, Manual therapy, and Re-evaluation   PLAN FOR NEXT SESSION: Progress DNF/ parascapular strength/ manual techniques for pain (no OMT due to EDS)   YVanessa [place] PT, DPT 01/03/22 7:10 PM

## 2022-01-04 DIAGNOSIS — L299 Pruritus, unspecified: Secondary | ICD-10-CM | POA: Diagnosis not present

## 2022-01-04 DIAGNOSIS — L659 Nonscarring hair loss, unspecified: Secondary | ICD-10-CM | POA: Diagnosis not present

## 2022-01-04 DIAGNOSIS — L700 Acne vulgaris: Secondary | ICD-10-CM | POA: Diagnosis not present

## 2022-01-05 DIAGNOSIS — F411 Generalized anxiety disorder: Secondary | ICD-10-CM | POA: Diagnosis not present

## 2022-01-06 ENCOUNTER — Ambulatory Visit (INDEPENDENT_AMBULATORY_CARE_PROVIDER_SITE_OTHER): Payer: BC Managed Care – PPO | Admitting: Orthopaedic Surgery

## 2022-01-06 DIAGNOSIS — S73191D Other sprain of right hip, subsequent encounter: Secondary | ICD-10-CM

## 2022-01-06 NOTE — Progress Notes (Signed)
Post Operative Evaluation    Procedure/Date of Surgery: Right hip arthroscopy with labral repair, pincer debridement 03/02/21  Interval History:   01/06/2022: Emmaleah presents today for follow-up of her right hip.  Overall she is continuing to have pinching and tenderness about the anterior aspect of the hip.  She continues to experience a click and a pop that is sore.  She has been able to work with physical therapy with limited relief.  She is here today for further assessment   PMH/PSH/Family History/Social History/Meds/Allergies:    Past Medical History:  Diagnosis Date   Acute recurrent pansinusitis 10/21/2019   ADHD (attention deficit hyperactivity disorder)    inattentive   Anemia    Anxiety    Asthma    Bronchitis    Cat allergy due to both airborne and skin contact 05/01/2016   Chronic sore throat 05/01/2016   COVID 2020   Depression    Fibromyalgia    Gastroparesis    GERD (gastroesophageal reflux disease)    Hypermobile Ehlers-Danlos syndrome    Irregular heart rate    Keratosis pilaris    Migraine    Seasonal allergic rhinitis 03/11/2014   Past Surgical History:  Procedure Laterality Date   ESOPHAGOGASTRODUODENOSCOPY ENDOSCOPY     03/2020   JOINT REPLACEMENT     LABRAL REPAIR Right 03/02/2021   Procedure: RIGHT HIP ARTHROSCOPY WITH LABRAL REPAIR AND PINCE DEBRIDEMENT;  Surgeon: [name], MD;  Location: [facility];  Service: Orthopedics;  Laterality: Right;   SMART PILL PROCEDURE     12/2020   TYMPANOSTOMY TUBE PLACEMENT Bilateral    WISDOM TOOTH EXTRACTION     Social History   Socioeconomic History   Marital status: Single    Spouse name: Not on file   Number of children: Not on file   Years of education: Not on file   Highest education level: Not on file  Occupational History   Not on file  Tobacco Use   Smoking status: Former    Types: E-cigarettes, Cigarettes    Quit date: 2021    Years since quitting: 2.6    Smokeless tobacco: Never  Vaping Use   Vaping Use: Former   Substances: Financial trader  Substance and Sexual Activity   Alcohol use: Not Currently   Drug use: Yes    Types: Marijuana    Comment: last use 7 days ago   Sexual activity: Yes    Birth control/protection: None  Other Topics Concern   Not on file  Social History Narrative   Right handed   Social Determinants of Health   Financial Resource Strain: Not on file  Food Insecurity: Not on file  Transportation Needs: Not on file  Physical Activity: Not on file  Stress: Not on file  Social Connections: Not on file   Family History  Problem Relation Age of Onset   Diabetes Mother    Polycystic ovary syndrome Mother    GER disease Father    Heart disease Paternal Uncle    Allergies  Allergen Reactions   Clindamycin Hives, Swelling and Rash   Tretinoin Dermatitis, Itching, Photosensitivity and Swelling   Current Outpatient Medications  Medication Sig Dispense Refill   lidocaine (LIDODERM) 5 % Place 1 patch onto the skin daily. Remove & Discard patch within 12 hours or as directed  by MD 5 patch 1   azelastine (ASTELIN) 0.1 % nasal spray PLACE 2 SPRAYS INTO BOTH NOSTRILS 2 TIMES DAILY AS NEEDED FOR RHINITIS (Patient not taking: Reported on 10/25/2021)     Cholecalciferol 25 MCG (1000 UT) tablet Take by mouth. (Patient not taking: Reported on 08/15/2021)     Ferrous Sulfate Dried (EQ SLOW-RELEASE IRON) 45 MG TBCR Take 1 tablet by mouth every 3 (three) days.     fluticasone (FLONASE) 50 MCG/ACT nasal spray SPRAY 1 SPRAY INTO BOTH NOSTRILS DAILY. (Patient not taking: Reported on 10/25/2021) 16 mL 1   hydrOXYzine (VISTARIL) 25 MG capsule Take 25 mg by mouth at bedtime.     levocetirizine (XYZAL) 5 MG tablet Take 5 mg by mouth every evening.     loratadine (CLARITIN) 10 MG tablet Take 1 tablet (10 mg total) by mouth daily. (Patient not taking: Reported on 10/25/2021) 90 tablet 1   Multiple Vitamins-Minerals (ADULT GUMMY PO)  Take 2 capsules by mouth daily. (Patient not taking: Reported on 10/25/2021)     naratriptan (AMERGE) 2.5 MG tablet Take by mouth.     Omega-3 Fatty Acids (FISH OIL PO) Take 700 Units by mouth.     ondansetron (ZOFRAN ODT) 4 MG disintegrating tablet Take 1 tablet (4 mg total) by mouth every 8 (eight) hours as needed for nausea or vomiting. 20 tablet 5   Pediatric Multiple Vitamins (PEDIATRIC MULTIVITAMIN) chewable tablet Chew 1 tablet by mouth daily.     promethazine (PHENERGAN) 25 MG tablet Take 1 tablet (25 mg total) by mouth every 6 (six) hours as needed for nausea or vomiting. 30 tablet 0   topiramate (TOPAMAX) 50 MG tablet Take 50 mg by mouth 1 day or 1 dose. (Patient not taking: Reported on 10/25/2021)     No current facility-administered medications for this visit.   No results found.  Review of Systems:   A ROS was performed including pertinent positives and negatives as documented in the HPI.   Musculoskeletal Exam:     Right hip incisions are well healed.  External rotation is to approximately 35 degrees versus 45 on the contralateral side.  Internal rotation is to 30 degrees with minimal pain.  There is some snapping about the psoas internally Imaging:    None  I personally reviewed and interpreted the radiographs.   Assessment:   26 year old female status post right hip arthroscopy.  She continues to have pain in the anterior aspect of the hip.  Today there is some snapping about the psoas.  To that effect I recommended ultrasound-guided injection of the right psoas tendon to assess if this gives her pain relief.  I will also plan to proceed with an MRI so that we can assess the status of her repair Plan :    -Return to clinic following MRI for further discussion   I personally saw and evaluated the patient, and participated in the management and treatment plan.  [name], MD Attending Physician, Orthopedic Surgery  This document was dictated using Dragon voice  recognition software. A reasonable attempt at proof reading has been made to minimize errors.

## 2022-01-12 ENCOUNTER — Encounter: Payer: Self-pay | Admitting: Physical Medicine and Rehabilitation

## 2022-01-12 ENCOUNTER — Ambulatory Visit: Payer: BC Managed Care – PPO

## 2022-01-12 ENCOUNTER — Encounter
Payer: BC Managed Care – PPO | Attending: Physical Medicine and Rehabilitation | Admitting: Physical Medicine and Rehabilitation

## 2022-01-12 VITALS — BP 104/71 | HR 80 | Ht 64.0 in | Wt 108.6 lb

## 2022-01-12 DIAGNOSIS — G894 Chronic pain syndrome: Secondary | ICD-10-CM | POA: Diagnosis not present

## 2022-01-12 DIAGNOSIS — K3184 Gastroparesis: Secondary | ICD-10-CM | POA: Insufficient documentation

## 2022-01-12 DIAGNOSIS — M797 Fibromyalgia: Secondary | ICD-10-CM | POA: Diagnosis not present

## 2022-01-12 DIAGNOSIS — M542 Cervicalgia: Secondary | ICD-10-CM | POA: Diagnosis not present

## 2022-01-12 DIAGNOSIS — R262 Difficulty in walking, not elsewhere classified: Secondary | ICD-10-CM | POA: Diagnosis not present

## 2022-01-12 DIAGNOSIS — K30 Functional dyspepsia: Secondary | ICD-10-CM | POA: Diagnosis not present

## 2022-01-12 DIAGNOSIS — M357 Hypermobility syndrome: Secondary | ICD-10-CM | POA: Diagnosis not present

## 2022-01-12 DIAGNOSIS — F32A Depression, unspecified: Secondary | ICD-10-CM

## 2022-01-12 DIAGNOSIS — M24159 Other articular cartilage disorders, unspecified hip: Secondary | ICD-10-CM | POA: Diagnosis not present

## 2022-01-12 DIAGNOSIS — M6281 Muscle weakness (generalized): Secondary | ICD-10-CM | POA: Diagnosis not present

## 2022-01-12 DIAGNOSIS — S73191S Other sprain of right hip, sequela: Secondary | ICD-10-CM | POA: Diagnosis not present

## 2022-01-12 DIAGNOSIS — M25551 Pain in right hip: Secondary | ICD-10-CM

## 2022-01-12 DIAGNOSIS — D509 Iron deficiency anemia, unspecified: Secondary | ICD-10-CM | POA: Diagnosis not present

## 2022-01-12 DIAGNOSIS — G4700 Insomnia, unspecified: Secondary | ICD-10-CM | POA: Diagnosis not present

## 2022-01-12 DIAGNOSIS — F411 Generalized anxiety disorder: Secondary | ICD-10-CM | POA: Diagnosis not present

## 2022-01-12 DIAGNOSIS — E611 Iron deficiency: Secondary | ICD-10-CM | POA: Diagnosis not present

## 2022-01-12 NOTE — Patient Instructions (Signed)
pdf of Pete Escogue's musculoskeletal alignment exercises: [email or web address]   Insomnia: -Try to go outside near sunrise -Get exercise during the day.  -Turn off all devices an hour before bedtime.  -Teas that can benefit: chamomile, valerian root, Brahmi (Bacopa) -Can consider over the counter melatonin, magnesium, and/or L-theanine. Melatonin is an anti-oxidant with multiple health benefits. Magnesium is involved in greater than 300 enzymatic reactions in the body and most of [place] are deficient as our soil is often depleted. There are 7 different types of magnesium- Bioptemizer's is a supplement with all 7 types, and each has unique benefits. Magnesium can also help with constipation and anxiety.  -Pistachios naturally increase the production of melatonin -[employer] bamboo bed sheets are free from toxic chemicals.  -Tart cherry juice or a tart cherry supplement can improve sleep and soreness post-workout

## 2022-01-12 NOTE — Progress Notes (Signed)
Subjective:    [name], female    DOB: 07-21-1995, 26 y.o.   MRN: [id number]  HPI  [name] is a 26 year old woman who presents for follow-up of her hypermobility- related pains, insomnia, palpitations, and dizziness.   1) Chronic nerve pain:  -Pain on average is 6/10 -Pain right now is 3/10 -She uses a heating pad. Alternates ice for short times -She has occasional flare-ups concentrated around her mid-section.  -She is currently on Cymbalta which can help with nerve pain.  -she was started on Gabapentin and felt double vision and tingling on lips.   2) Pelvic floor dysfunction: -She is starting with a new therapist soon.  -Had a pelvic MRI at [facility] and reviewed this with her and was negative.  -she asks if this is contributing to her hip pain.   3) GI symptoms -Struggled with these since she was 26 years old.   4) History of multiple suicidal ideations  5) Spasms: -present throughout her body -her chriopractor may start her on a muscle relaxer.   6) Depression:  -suffered night sweats from Cymbalta.   7) Insomnia:  -sleep has been very poor for a year.  -she feels that pain is contributing -her job ends at 10:30.  -she did not find Nortryptyline 40mg  to be very helpful -she has tried Amitriptyline in the past but cannot remember her side effect to it- thinks it may have been grogginess.   8) Anxiety: -she takes clonazepam as needed.  -has been working long hours at night without breaks -received a note saying she can only do 4 days per week.  9) ADHD: has not found much improvement in focus with Ritalin.   10) Migraines- has been following with neurology.Has not found great relief with the Rizatriptan. The Nortriptyline seems to have been making her thoughts more negative.   11) Right hip pain getting worse despite aquatic therapy -she was found to have a labral tear on MRI and has a surgical consultation on Friday.  -had  arthroscopy last year -did PT and does not feel she did her best with home exercises because life has been tough -pain has been really bad in the last few weeks, last week was terrible -right now pain is 2-3 -last week it started to feel like how it felt prior to surgery -she had a steroid injection with lidocaine last week and pain felt terribly worse and she had to take it really easy the next day -he ordered an MRI which is on the 11th.   12) Palpitations -she has had since she was a child  13) Dizziness: -she often has symptoms when she stands from a seated position -she tried midodrine but experienced side effects -she does have a blood pressure cuff to check her pressures at home -she usually checks her BP when she is in a seated position.       Pain Inventory Average Pain 5 Pain Right Now 6 My pain is constant, sharp, dull, and tingling  In the last 24 hours, has pain interfered with the following? General activity 8 Relation with others 8 Enjoyment of life 10 What TIME of day is your pain at its worst? morning  and evening Sleep (in general) Poor  Pain is worse with: walking, inactivity, and some activites Pain improves with: heat/ice and pacing activities Relief from Meds: 0      Family History  Problem Relation Age of Onset   Diabetes Mother  Polycystic ovary syndrome Mother    GER disease Father    Heart disease Paternal Uncle    Social History   Socioeconomic History   Marital status: Single    Spouse name: Not on file   Number of children: Not on file   Years of education: Not on file   Highest education level: Not on file  Occupational History   Not on file  Tobacco Use   Smoking status: Former    Types: E-cigarettes, Cigarettes    Quit date: 2021    Years since quitting: 2.6   Smokeless tobacco: Never  Vaping Use   Vaping Use: Former   Substances: Financial trader  Substance and Sexual Activity   Alcohol use: Not Currently    Drug use: Yes    Types: Marijuana    Comment: last use 7 days ago   Sexual activity: Yes    Birth control/protection: None  Other Topics Concern   Not on file  Social History Narrative   Right handed   Social Determinants of Health   Financial Resource Strain: Not on file  Food Insecurity: Not on file  Transportation Needs: Not on file  Physical Activity: Not on file  Stress: Not on file  Social Connections: Not on file   Past Surgical History:  Procedure Laterality Date   ESOPHAGOGASTRODUODENOSCOPY ENDOSCOPY     03/2020   JOINT REPLACEMENT     LABRAL REPAIR Right 03/02/2021   Procedure: RIGHT HIP ARTHROSCOPY WITH LABRAL REPAIR AND PINCE DEBRIDEMENT;  Surgeon: [name], MD;  Location: [facility] OR;  Service: Orthopedics;  Laterality: Right;   SMART PILL PROCEDURE     12/2020   TYMPANOSTOMY TUBE PLACEMENT Bilateral    WISDOM TOOTH EXTRACTION     Past Medical History:  Diagnosis Date   Acute recurrent pansinusitis 10/21/2019   ADHD (attention deficit hyperactivity disorder)    inattentive   Anemia    Anxiety    Asthma    Bronchitis    Cat allergy due to both airborne and skin contact 05/01/2016   Chronic sore throat 05/01/2016   COVID 2020   Depression    Fibromyalgia    Gastroparesis    GERD (gastroesophageal reflux disease)    Hypermobile Ehlers-Danlos syndrome    Irregular heart rate    Keratosis pilaris    Migraine    Seasonal allergic rhinitis 03/11/2014   Ht 5\' 4"  (1.626 m)   BMI 17.34 kg/m   Opioid Risk Score:   Fall Risk Score:  `1  Depression screen [facility] 2/9     02/14/2021   12:13 PM 01/11/2021    9:23 AM 11/02/2020    3:33 PM 10/11/2020   12:31 PM 09/28/2020   11:16 AM 09/21/2020    9:41 AM 07/29/2020   10:16 AM  Depression screen PHQ 2/9  Decreased Interest 1 1 2 1 1  0 2  Down, Depressed, Hopeless 2 1 2 1 1 1 2   PHQ - 2 Score 3 2 4 2 2 1 4   Altered sleeping 3  3   3 3   Tired, decreased energy 3  3   3 3   Change in appetite 3  3   3 3    Feeling bad or failure about yourself  2  3   2 3   Trouble concentrating 3  3   3 3   Moving slowly or fidgety/restless 3  3   2 3   Suicidal thoughts 1  1   1 1   PHQ-9 Score  21  23   18 23   Difficult doing work/chores Very difficult  Extremely dIfficult   Extremely dIfficult Very difficult    Review of Systems  Constitutional:  Negative for diaphoresis and unexpected weight change.  HENT: Negative.    Eyes: Negative.   Respiratory:  Negative for shortness of breath.   Cardiovascular:  Negative for leg swelling.  Gastrointestinal:  Negative for abdominal pain, constipation, diarrhea and nausea.  Endocrine: Negative.   Genitourinary:  Positive for pelvic pain.       Bladder control  Musculoskeletal:  Positive for back pain, gait problem and myalgias.       Pain on right side of head , pain in right and left hand ,   Skin: Negative.   Allergic/Immunologic: Negative.   Neurological:  Negative for dizziness and weakness.       Tingling  Hematological:  Does not bruise/bleed easily.  Psychiatric/Behavioral:  Positive for sleep disturbance and suicidal ideas. Negative for confusion and dysphoric mood. The patient is not nervous/anxious.   All other systems reviewed and are negative.     Objective:   Physical Exam Not performed as patient was seen via phone.     Assessment & Plan:  1) Chronic Pain Syndrome secondary to fibromyalgia -Discussed current symptoms of pain and history of pain.  -Discussed benefits of exercise in reducing pain. -Apply emu oil. -continue Lamotrigine -discussed benefits of minimizing medications.  -Lupus panel ordered.  -RA and ANA reviewed and negative.  -Discussed trigger point injections -Discussed current symptoms of pain and history of pain.  -Discussed benefits of exercise in reducing pain. -Discussed following foods that may reduce pain: 1) Ginger (especially studied for arthritis)- reduce leukotriene production to decrease inflammation 2)  Blueberries- high in phytonutrients that decrease inflammation 3) Salmon- marine omega-3s reduce joint swelling and pain 4) Pumpkin seeds- reduce inflammation 5) dark chocolate- reduces inflammation 6) turmeric- reduces inflammation 7) tart cherries - reduce pain and stiffness 8) extra virgin olive oil - its compound olecanthal helps to block prostaglandins  9) chili peppers- can be eaten or applied topically via capsaicin 10) mint- helpful for headache, muscle aches, joint pain, and itching 11) garlic- reduces inflammation  Link to further information on diet for chronic pain:   2) Pelvic floor dysfunction: -continue pelvic floor therapy.  -estrogen, testosterone,  level -provided a link to a pdf of Pete Escogue's musculoskeletal alignment exercises: [email or web address]   3) Depression: -she has been going to therapy twice per week. -had negative response to Cymbalta (night sweats).  -discussed Remeron and Amitriptyline as medication options- she cannot remember why she stopped these in the pasts. Will defer until we see her blood pressure/HR trends.   4) Functional dyspepsia: -refer to gastroenterology for gastric emptying study.  -no benefits from Buspar, stop Buspar.  -discussed diet  5) contraception: -started a NuvaRing a couple of days ago.   6) pain in lower back and legs: -stop Gabapentin  7) insomnia: -did not have results with Amitriptyline 10mg  or Nortriptyline 50mg - have discontinued both -Try to go outside near sunrise -Get exercise during the day.  -Turn off all devices an hour before bedtime.  -Teas that can benefit: chamomile, valerian root, Brahmi (Bacopa) -Can consider over the counter melatonin, magnesium, and/or L-theanine. Melatonin is an anti-oxidant with multiple health benefits. Magnesium is involved in  greater than 300 enzymatic reactions in the body and most of [email or web address] are deficient as our soil is often depleted. There are 7 different types of  magnesium- Bioptemizer's is a supplement with all 7 types, and each has unique benefits. Magnesium can also help with constipation and anxiety.  -Pistachios naturally increase the production of melatonin -[employer] bamboo bed sheets are free from toxic chemicals.  -Tart cherry juice or a tart cherry supplement can improve sleep and soreness post-workout    8) Foods to alleviate migraine:  1) dark leafy greens 2) avocado 3) tuna 4) samon and mackerel 5) beans and legumes Supplements that can be helpful: feverfew, B12, and magnesium  Foods to avoid in migraine: 1) Excessive (or irregular timing) coffee 2) red wine 3) aged cheeses 4) chocolate 5) citrus fruits 6) aspartame and other artifical sweeteners 7) yeast 8) MSG (in processed foods) 9) processed and cured meats 10) nuts and certain seeds 11) chicken livers and other organ meats 12) dairy products like buttermilk, sour cream, and yogurt 13) dried fruits like dates, figs, and raisins 14) garlic 15) onions 16) potato chips 17) pickled foods like olives and sauerkraut 18) some fresh fruits like ripe banana, papaya, red plums, raspberries, kiwi, pineapple 19) tomato-based products  Recommend to keep a migraine diary: rate daily the severity of your headache (1-10) and what foods you eat that day to help determine patterns.    9) Hypotension: discontinued the midodrine. Recommended she check her BP daily while lying down, sitting up, and standing so we can observe for orthostatic hypotension.  10) Palpitations: discussed that could be secondary to dysautonomia which can respond to propanolol or metoprolol. Will check BP/HR first before trying these medications which can lower BP

## 2022-01-12 NOTE — Therapy (Signed)
OUTPATIENT PHYSICAL THERAPY TREATMENT NOTE   Patient Name: Brianna [name] MRN: [id number] DOB:1995-10-05, 26 y.o., female Today's Date: 01/12/2022  PCP: Josephina Shih, MD REFERRING PROVIDER: Vanetta Mulders, MD  END OF SESSION:   PT End of Session - 01/12/22 1743     Visit Number 9    Number of Visits 14    Date for PT Re-Evaluation 02/21/22    Authorization Type BC[employer]    Authorization Time Period FOTO v6, v10    PT Start Time 1744    PT Stop Time 1826    PT Time Calculation (min) 42 min    Activity Tolerance Patient tolerated treatment well    Behavior During Therapy [facility] for tasks assessed/performed                   Past Medical History:  Diagnosis Date   Acute recurrent pansinusitis 10/21/2019   ADHD (attention deficit hyperactivity disorder)    inattentive   Anemia    Anxiety    Asthma    Bronchitis    Cat allergy due to both airborne and skin contact 05/01/2016   Chronic sore throat 05/01/2016   COVID 2020   Depression    Fibromyalgia    Gastroparesis    GERD (gastroesophageal reflux disease)    Hypermobile Ehlers-Danlos syndrome    Irregular heart rate    Keratosis pilaris    Migraine    Seasonal allergic rhinitis 03/11/2014   Past Surgical History:  Procedure Laterality Date   ESOPHAGOGASTRODUODENOSCOPY ENDOSCOPY     03/2020   JOINT REPLACEMENT     LABRAL REPAIR Right 03/02/2021   Procedure: RIGHT HIP ARTHROSCOPY WITH LABRAL REPAIR AND PINCE DEBRIDEMENT;  Surgeon: Vanetta Mulders, MD;  Location: [place];  Service: Orthopedics;  Laterality: Right;   SMART PILL PROCEDURE     12/2020   TYMPANOSTOMY TUBE PLACEMENT Bilateral    WISDOM TOOTH EXTRACTION     Patient Active Problem List   Diagnosis Date Noted   Cervical adenopathy 06/10/2021   Chronic pruritus 06/10/2021   Exercise-induced asthma 06/09/2021   Chronic venous insufficiency 06/03/2021   Orthostatic lightheadedness 06/03/2021   Purple toe syndrome of both feet ([place])  06/03/2021   Autonomic dysfunction 06/03/2021   Labral tear of hip, degenerative 04/13/2021   Migraine headache 03/01/2021   Headache 11/30/2020   Positive ANA (antinuclear antibody) 11/15/2020   Fibromyalgia 11/02/2020   Hair loss 11/02/2020   Muscle spasticity 10/22/2020   Hypermobile Ehlers-Danlos syndrome 10/22/2020   Paresthesia of bilateral legs 10/06/2020   Myalgia 10/06/2020   Chronic pelvic pain in female 10/06/2020   Chronic abdominal pain 10/06/2020   Irritable colon 08/02/2020   Pelvic floor dysfunction 08/02/2020   Irritant dermatitis 08/02/2020   Gastroesophageal reflux disease 06/28/2020   Severe recurrent major depression without psychotic features ([place]) 06/28/2020   Polyarthralgia 06/28/2020   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 06/28/2020   Tachycardia 06/28/2020   History of COVID-19 06/28/2020   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 deficit 06/28/2020   Recurrent major depressive disorder, in full remission ([place]) 06/28/2020   Atypical chest pain 06/22/2020   Dizziness 06/22/2020   Palpitations 06/14/2020   Anxiety 05/04/2020   Internal and external hemorrhoids without complication 73/53/2992   Nondiabetic gastroparesis 03/03/2020   ETD (Eustachian tube dysfunction), bilateral 10/21/2019   Acne vulgaris 03/11/2014   Mild intermittent asthma without complication 42/68/3419   Seasonal allergies 03/11/2014    REFERRING DIAG: M25.551 (ICD-10-CM) - Pain in right hip  THERAPY  DIAG:  Neck pain  Pain in right hip  Muscle weakness (generalized)  Difficulty in walking, not elsewhere classified  Rationale for Evaluation and Treatment Rehabilitation  PERTINENT HISTORY: 03/02/2021: RIGHT HIP ARTHROSCOPY WITH LABRAL REPAIR AND PINCE DEBRIDEMENT, anxiety, depression, fibromyalgia, asthma, Symptomatic Ehler's-Danlos syndrome  PRECAUTIONS: 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 She also reports increased Rt neck/ UT pain after "sleeping wrong" last night. She reports continued adherence to her HEP.  PAIN:  Are you having pain? Yes: NPRS scale: 6.5/10 Pain location: Rt posterior neck/ upper back Pain description: Achy, sharp Aggravating factors: BIL cervical rotation, looking down, looking down at computer screen/ phone Relieving factors: [employer]   OBJECTIVE: (objective measures completed at initial evaluation unless otherwise dated)   DIAGNOSTIC FINDINGS: MR Hip Right with contrast: IMPRESSION: 1. Full-thickness tear of the right anterior superior labrum.   PATIENT SURVEYS:  FOTO Cervical: 59%, predicted 67% in 11 visits; Hip: 58%, predicted 71% in 13 visits 12/08/2021: Cervical: 51%; Hip: 61%   COGNITION:           Overall cognitive status: Within functional limits for tasks assessed                          SENSATION: Not tested     POSTURE:  Forward head/ BIL rounded shoulders   PALPATION: TTP with palpable trigger points to BIL cervical paraspinals/ UT/ mid trap           PASSIVE ACCESSORIES: Hypermobile/ painful CPAs C2-C3, C5-C7 Hypomobile/ painful CPAs C4-C5, T1-T8   CERVICAL ROM:    AROM A/PROM (deg) 10/25/2021 AROM 11/16/2021 AROM 01/03/2022  Flexion 40p! 68   Extension 65p!! 42p! 60, minor pain  Right lateral flexion 45 45   Left lateral flexion 45 45   Right rotation 61p! 81   Left rotation 68p! 81    (Blank rows = not tested)   UE ROM:   AROM Right 10/25/2021 Left 10/25/2021  Shoulder flexion WNL WNL  Shoulder abduction WNL WNL   (Blank rows = not tested)   UE MMT:   MMT Right 10/25/2021 Left 10/25/2021 Right 12/08/2021 Left 12/08/2021 Right 01/03/2022 Left 01/03/2022  Shoulder flexion 5/5 5/5      Shoulder abduction 5/5 5/5      Cervical flexion 4/5p!     Cervical extension 4/5p!      Cervical side bend 5/5 5/5      Shoulder IR 5/5 5/5      Shoulder ER 5/5 5/5      Mid trap 3/5 3/5 4/5 4/5 4+/5 4+/5  Low trap 3/5 3/5 4/5 4/5 4+/5 4+/5  Latissimus dorsi 3/5 3/5 4+/5 4+/5 4+/5 4+/5   (Blank rows = not tested)   CERVICAL SPECIAL TESTS:  Cervical distraction: (-) Cervical compression: (+) Quadrant testing: (+) BIL     FUNCTIONAL TESTS:  DNF endurance test: 15 seconds    01/03/2022: DNF endurance: 35 seconds     TODAY'S TREATMENT:  [facility] Adult PT Treatment:                                                DATE: 01/12/2022 Therapeutic  Exercise: Quadruped cervical retraction/ protraction against YTB 2x15 Bent-over rear delt fly with 3# cable 2x10 BIL Standing BIL shoulder scaption with back and head against wall with 4# dumbbells 2x10 Manual Therapy: Seated pin and stretch to BIL UT 2x30 seconds BIL Seated effleurage/ STM to BIL UT x5 minutes Supine effleurage to BIL cervical paraspinals and SCM x8 minutes Neuromuscular re-ed: N/A Therapeutic Activity: N/A Modalities: N/A Self Care: N/A   [facility] Adult PT Treatment:                                                DATE: 01/03/2022 Therapeutic Exercise: Forward lunge push-pull with two 7# cables at [employer] machine 2x10 BIL Standing arm circle angels 2x5 Standing BIL shoulder extension with 7# cables 3x10 Manual Therapy: Seated pin and stretch to BIL UT 2x30 seconds BIL Seated Effleurage/ STM to BIL UT x8 minutes Neuromuscular re-ed: N/A Therapeutic Activity: Re-assessment of objective measures with pt education Modalities: N/A Self Care: N/A   [facility] Adult PT Treatment:                                                DATE: 12/08/2021 Therapeutic Exercise: Forward lunge push-pull with two 7# cables at [employer] machine 2x10 BIL Standing shoulder rolls 2x10 forward and backward Standing low rows with 7# cables 3x10 Manual Therapy: Skilled palpation to identify trigger points prior to TPDN x3  minutes Effleurage to BIL UT/ suboccipitals/ MT following TPDN x5 minutes Supine suboccipital release x8 minutes Neuromuscular re-ed: N/A Therapeutic Activity: N/A Modalities: N/A Self Care: N/A  Trigger Point Dry-Needling  Treatment instructions: Expect mild to moderate muscle soreness. S/S of pneumothorax if dry needled over a lung field, and to seek immediate medical attention should they occur. Patient verbalized understanding of these instructions and education.  Patient Consent Given: Yes Education handout provided: Previously Muscles treated: BIL UT, BIL suboccipital musculature, Rt mid trap Electrical stimulation performed: No Parameters: N/A Treatment response/outcome: Multiple twitch responses, improved muscle extensibility, improved pain      HOME EXERCISE PROGRAM: Access Code: [email or web address] URL: [email or web address] Date: 10/25/2021 Prepared by: Vanessa Crawford   Exercises - Seated Cervical Retraction  - 1 x daily - 7 x weekly - 3 sets - 10 reps - 3-sec hold - Standing Shoulder Row with Anchored Resistance  - 1 x daily - 7 x weekly - 3 sets - 10 reps - 3-sec hold - Low trap slides at wall with lift-off  - 1 x daily - 7 x weekly - 3 sets - 10 reps - 3-sec hold - Shoulder Extension with Resistance  - 1 x daily - 7 x weekly - 3 sets - 10 reps - 3-sec hold  Added 11/16/2021: - Seated Upper Trapezius Stretch  - 1 x daily - 7 x weekly - 2 sets - 1-min hold - Seated Levator Scapulae Stretch  - 1 x daily - 7 x weekly - 2 sets - 1-min hold  Added 12/03/2021: - Seated Assisted Cervical Rotation with Towel  - 1 x daily - 7 x weekly - 3 sets - 10 reps - Cervical Extension AROM with Strap  - 1 x daily - 7 x weekly - 3 sets - 10 reps   ASSESSMENT:  CLINICAL IMPRESSION: Pt responded well to all interventions today, demonstrating good form and no increase in pain with completed exercises. Pt reports a therapeutic response to manual therapy, reporting a decrease  in neck pain to 4/10. Pt will continue to benefit from skilled PT to address her primary impairments and return to her prior level of function with less limitation.     OBJECTIVE IMPAIRMENTS Abnormal gait, decreased balance, decreased mobility, difficulty walking, decreased ROM, decreased strength, hypomobility, increased edema, increased muscle spasms, impaired flexibility, improper body mechanics, postural dysfunction, and pain.    ACTIVITY LIMITATIONS carrying, lifting, bending, sitting, standing, squatting, sleeping, stairs, transfers, and locomotion level   PARTICIPATION LIMITATIONS: meal prep, cleaning, laundry, interpersonal relationship, driving, shopping, community activity, occupation, and yard work   PERSONAL FACTORS Past/current experiences, Time since onset of injury/illness/exacerbation, and 3+ comorbidities: See medical hx  are also affecting patient's functional outcome.        GOALS: Goals reviewed with patient? Yes   SHORT TERM GOALS: Target date: 11/22/2021  Pt will report understanding and adherence to initial HEP in order to promote independence in the management of primary impairments. Baseline: HEP provided at eval Goal status: MET Pt reports adherence 12/03/21     LONG TERM GOALS: Target date: 12/20/2021    Pt will achieve a cervical FOTO score of 67% in order to demonstrate improved functional ability as it relates to her primary impairments. Baseline: 59% 12/08/2021: 51% Goal status: IN PROGRESS   2.  Pt will achieve BIL global parascapular strength of 4+/5 or higher in order to promote improved posture and prophylaxis of future mechanical neck pain Baseline: 3/5 global parascapular strength 12/08/2021: See updated MMT chart 01/03/2022: See updated MMT chart Goal status: ACHIEVED   3.  Pt will achieve global cervical AROM WNL and with 0-2/10 pain in order to get dressed with less limitation. Baseline: See AROM chart 11/16/2021: See updated AROM chart 01/03/2022:  See updated AROM (4/10 pain with extension) Goal status: IN PROGRESS   4.  Pt will report ability to complete a full work day, including looking down at her computer screen with 0-3/10 pain in order to complete her work duties with less limitation. Baseline: Pt has >6/10 pain at the end of her work day 01/03/2022: Pt reports average daily pain at work of 5-6/10 Goal status: IN PROGRESS   5.  *New 01/03/2022* Pt will report ability to sleep through the night without being waken due to pain.   Baseline: Pt woken 5x per night   Goal status: INITIAL       PLAN: PT FREQUENCY: 1x/week   PT DURATION: 6 weeks   PLANNED INTERVENTIONS: Therapeutic exercises, Therapeutic activity, Neuromuscular re-education, Balance training, Gait training, Patient/Family education, Joint mobilization, Stair training, Aquatic Therapy, Dry Needling, Electrical stimulation, Spinal mobilization, Cryotherapy, Moist heat, Taping, Vasopneumatic device, Traction, Biofeedback, Ionotophoresis 60m/ml Dexamethasone, Manual therapy, and Re-evaluation   PLAN FOR NEXT SESSION: Progress DNF/ parascapular strength/ manual techniques for pain (no OMT due to EDS)   YVanessa Paradis PT, DPT 01/12/22 6:27 PM

## 2022-01-13 DIAGNOSIS — F411 Generalized anxiety disorder: Secondary | ICD-10-CM | POA: Diagnosis not present

## 2022-01-13 DIAGNOSIS — F3341 Major depressive disorder, recurrent, in partial remission: Secondary | ICD-10-CM | POA: Diagnosis not present

## 2022-01-13 DIAGNOSIS — G894 Chronic pain syndrome: Secondary | ICD-10-CM | POA: Diagnosis not present

## 2022-01-13 DIAGNOSIS — R45851 Suicidal ideations: Secondary | ICD-10-CM | POA: Diagnosis not present

## 2022-01-15 DIAGNOSIS — F3341 Major depressive disorder, recurrent, in partial remission: Secondary | ICD-10-CM | POA: Diagnosis not present

## 2022-01-15 DIAGNOSIS — G894 Chronic pain syndrome: Secondary | ICD-10-CM | POA: Diagnosis not present

## 2022-01-15 DIAGNOSIS — F411 Generalized anxiety disorder: Secondary | ICD-10-CM | POA: Diagnosis not present

## 2022-01-15 DIAGNOSIS — F5081 Binge eating disorder: Secondary | ICD-10-CM | POA: Diagnosis not present

## 2022-01-18 ENCOUNTER — Ambulatory Visit: Payer: BC Managed Care – PPO

## 2022-01-18 DIAGNOSIS — R262 Difficulty in walking, not elsewhere classified: Secondary | ICD-10-CM

## 2022-01-18 DIAGNOSIS — M25551 Pain in right hip: Secondary | ICD-10-CM | POA: Diagnosis not present

## 2022-01-18 DIAGNOSIS — G43719 Chronic migraine without aura, intractable, without status migrainosus: Secondary | ICD-10-CM | POA: Diagnosis not present

## 2022-01-18 DIAGNOSIS — M542 Cervicalgia: Secondary | ICD-10-CM

## 2022-01-18 DIAGNOSIS — M24159 Other articular cartilage disorders, unspecified hip: Secondary | ICD-10-CM | POA: Diagnosis not present

## 2022-01-18 DIAGNOSIS — M6281 Muscle weakness (generalized): Secondary | ICD-10-CM

## 2022-01-18 DIAGNOSIS — G4486 Cervicogenic headache: Secondary | ICD-10-CM | POA: Diagnosis not present

## 2022-01-18 DIAGNOSIS — M357 Hypermobility syndrome: Secondary | ICD-10-CM

## 2022-01-18 NOTE — Therapy (Signed)
OUTPATIENT PHYSICAL THERAPY TREATMENT NOTE  Progress Note Reporting Period 10/25/2021 to 01/18/2022   See note below for Objective Data and Assessment of Progress/Goals.      Patient Name: Brianna [name] MRN: [id number] DOB:04/16/1996, 26 y.o., female Today's Date: 01/18/2022  PCP: Josephina Shih, MD REFERRING PROVIDER: Vanetta Mulders, MD  END OF SESSION:   PT End of Session - 01/18/22 1724     Visit Number 10    Number of Visits 14    Date for PT Re-Evaluation 02/21/22    Authorization Type [employer]    Authorization Time Period FOTO v6, v10    PT Start Time 7017   Pt arrived 10 minutes late to appointment.   PT Stop Time 1742    PT Time Calculation (min) 30 min    Activity Tolerance Patient tolerated treatment well    Behavior During Therapy [facility] for tasks assessed/performed                    Past Medical History:  Diagnosis Date   Acute recurrent pansinusitis 10/21/2019   ADHD (attention deficit hyperactivity disorder)    inattentive   Anemia    Anxiety    Asthma    Bronchitis    Cat allergy due to both airborne and skin contact 05/01/2016   Chronic sore throat 05/01/2016   COVID 2020   Depression    Fibromyalgia    Gastroparesis    GERD (gastroesophageal reflux disease)    Hypermobile Ehlers-Danlos syndrome    Irregular heart rate    Keratosis pilaris    Migraine    Seasonal allergic rhinitis 03/11/2014   Past Surgical History:  Procedure Laterality Date   ESOPHAGOGASTRODUODENOSCOPY ENDOSCOPY     03/2020   JOINT REPLACEMENT     LABRAL REPAIR Right 03/02/2021   Procedure: RIGHT HIP ARTHROSCOPY WITH LABRAL REPAIR AND PINCE DEBRIDEMENT;  Surgeon: Vanetta Mulders, MD;  Location: [place];  Service: Orthopedics;  Laterality: Right;   SMART PILL PROCEDURE     12/2020   TYMPANOSTOMY TUBE PLACEMENT Bilateral    WISDOM TOOTH EXTRACTION     Patient Active Problem List   Diagnosis Date Noted   Cervical adenopathy 06/10/2021   Chronic  pruritus 06/10/2021   Exercise-induced asthma 06/09/2021   Chronic venous insufficiency 06/03/2021   Orthostatic lightheadedness 06/03/2021   Purple toe syndrome of both feet ([place]) 06/03/2021   Autonomic dysfunction 06/03/2021   Labral tear of hip, degenerative 04/13/2021   Migraine headache 03/01/2021   Headache 11/30/2020   Positive ANA (antinuclear antibody) 11/15/2020   Fibromyalgia 11/02/2020   Hair loss 11/02/2020   Muscle spasticity 10/22/2020   Hypermobile Ehlers-Danlos syndrome 10/22/2020   Paresthesia of bilateral legs 10/06/2020   Myalgia 10/06/2020   Chronic pelvic pain in female 10/06/2020   Chronic abdominal pain 10/06/2020   Irritable colon 08/02/2020   Pelvic floor dysfunction 08/02/2020   Irritant dermatitis 08/02/2020   Gastroesophageal reflux disease 06/28/2020   Severe recurrent major depression without psychotic features ([place]) 06/28/2020   Polyarthralgia 06/28/2020   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 06/28/2020   Tachycardia 06/28/2020   History of COVID-19 06/28/2020   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 deficit 06/28/2020   Recurrent major depressive disorder, in full remission ([place]) 06/28/2020   Atypical chest pain 06/22/2020   Dizziness 06/22/2020   Palpitations 06/14/2020   Anxiety 05/04/2020   Internal and external hemorrhoids without complication 79/39/0300   Nondiabetic gastroparesis 03/03/2020   ETD (Eustachian tube dysfunction), bilateral 10/21/2019  Acne vulgaris 03/11/2014   Mild intermittent asthma without complication 61/22/4497   Seasonal allergies 03/11/2014    REFERRING DIAG: M25.551 (ICD-10-CM) - Pain in right hip  THERAPY DIAG:  Neck pain  Pain in right hip  Muscle weakness (generalized)  Difficulty in walking, not elsewhere classified  Hypermobility syndrome  Labral tear of hip, degenerative  Rationale for Evaluation and Treatment Rehabilitation  PERTINENT HISTORY: 03/02/2021:  RIGHT HIP ARTHROSCOPY WITH LABRAL REPAIR AND PINCE DEBRIDEMENT, anxiety, depression, fibromyalgia, asthma, Symptomatic Ehler's-Danlos syndrome  PRECAUTIONS: 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 She reports that sleeping on her stomach increases her pain.  PAIN:  Are you having pain? Yes: NPRS scale: 3.5/10 Pain location: Rt posterior neck/ upper back Pain description: Achy, sharp Aggravating factors: BIL cervical rotation, looking down, looking down at computer screen/ phone Relieving factors: [employer]   OBJECTIVE: (objective measures completed at initial evaluation unless otherwise dated)   DIAGNOSTIC FINDINGS: MR Hip Right with contrast: IMPRESSION: 1. Full-thickness tear of the right anterior superior labrum.   PATIENT SURVEYS:  FOTO Cervical: 59%, predicted 67% in 11 visits; Hip: 58%, predicted 71% in 13 visits 12/08/2021: Cervical: 51%; Hip: 61% 01/18/2022: Cervical: 60%   COGNITION:           Overall cognitive status: Within functional limits for tasks assessed                          SENSATION: Not tested     POSTURE:  Forward head/ BIL rounded shoulders   PALPATION: TTP with palpable trigger points to BIL cervical paraspinals/ UT/ mid trap           PASSIVE ACCESSORIES: Hypermobile/ painful CPAs C2-C3, C5-C7 Hypomobile/ painful CPAs C4-C5, T1-T8   CERVICAL ROM:    AROM A/PROM (deg) 10/25/2021 AROM 11/16/2021 AROM 01/03/2022  Flexion 40p! 68   Extension 65p!! 42p! 60, minor pain  Right lateral flexion 45 45   Left lateral flexion 45 45   Right rotation 61p! 81   Left rotation 68p! 81    (Blank rows = not tested)   UE ROM:   AROM Right 10/25/2021 Left 10/25/2021  Shoulder flexion WNL WNL  Shoulder abduction WNL WNL   (Blank rows = not tested)   UE MMT:   MMT Right 10/25/2021 Left 10/25/2021 Right 12/08/2021 Left 12/08/2021 Right 01/03/2022 Left 01/03/2022   Shoulder flexion 5/5 5/5      Shoulder abduction 5/5 5/5      Cervical flexion 4/5p!     Cervical extension 4/5p!     Cervical side bend 5/5 5/5      Shoulder IR 5/5 5/5      Shoulder ER 5/5 5/5      Mid trap 3/5 3/5 4/5 4/5 4+/5 4+/5  Low trap 3/5 3/5 4/5 4/5 4+/5 4+/5  Latissimus dorsi 3/5 3/5 4+/5 4+/5 4+/5 4+/5   (Blank rows = not tested)   CERVICAL SPECIAL TESTS:  Cervical distraction: (-) Cervical compression: (+) Quadrant testing: (+) BIL     FUNCTIONAL TESTS:  DNF endurance test: 15 seconds    01/03/2022: DNF endurance: 35 seconds    01/18/2022: DNF endurance: 58 seconds     TODAY'S TREATMENT:  [facility] Adult PT Treatment:  DATE: 01/18/2022 Therapeutic Exercise: Bent-over rear delt flies with 2# dumbbells 3x10 Standing BIL shoulder scaption with back and head against wall with 4# dumbbells 3x10 Forearm plank with side-to-side forearm shuffles 3x10 BIL Supine serratus punches with alternating scapular retraction into table with 4# dumbbells 3x12 Seated cervical extension SNAG with towel 2x50mn Manual Therapy: N/A Neuromuscular re-ed: N/A Therapeutic Activity: Re-assessment of objective measures with pt education Re-administration of FOTO with pt education Modalities: N/A Self Care: N/A   [facility] Adult PT Treatment:                                                DATE: 01/12/2022 Therapeutic Exercise: Quadruped cervical retraction/ protraction against YTB 2x15 Bent-over rear delt fly with 3# cable 2x10 BIL Standing BIL shoulder scaption with back and head against wall with 4# dumbbells 2x10 Manual Therapy: Seated pin and stretch to BIL UT 2x30 seconds BIL Seated effleurage/ STM to BIL UT x5 minutes Supine effleurage to BIL cervical paraspinals and SCM x8 minutes Neuromuscular re-ed: N/A Therapeutic Activity: N/A Modalities: N/A Self Care: N/A   [facility]                                                 [facility]: 01/03/2022 Therapeutic Exercise: Forward lunge push-pull with two 7# cables at [employer]arker [employer] 2x10 BIL Standing arm circle angels 2x5 Standing BIL shoulder extension with 7# cables 3x10 Manual Therapy: Seated pin and stretch to BIL UT 2x30 seconds BIL Seated Effleurage/ STM to BIL UT x8 minutes Neuromuscular re-ed: N/A Therapeutic Activity: Re-assessment of objective measures with pt education Modalities: N/A Self Care: N/A     HOME EXERCISE PROGRAM: Access Code: [email or web address]: [email or web address] Date: 10/25/2021 Prepared by: TVanessa Dumont  Exercises - Seated Cervical Retraction  - 1 x daily - 7 x weekly - 3 sets - 10 reps - 3-sec hold - Standing Shoulder Row with Anchored Resistance  - 1 x daily - 7 x weekly - 3 sets - 10 reps - 3-sec hold - Low trap slides at wall with lift-off  - 1 x daily - 7 x weekly - 3 sets - 10 reps - 3-sec hold - Shoulder Extension with Resistance  - 1 x daily - 7 x weekly - 3 sets - 10 reps - 3-sec hold  Added 11/16/2021: - Seated Upper Trapezius Stretch  - 1 x daily - 7 x weekly - 2 sets - 1-min hold - Seated Levator Scapulae Stretch  - 1 x daily - 7 x weekly - 2 sets - 1-min hold  Added 12/03/2021: - Seated Assisted Cervical Rotation with Towel  - 1 x daily - 7 x weekly - 3 sets - 10 reps - Cervical Extension AROM with Strap  - 1 x daily - 7 x weekly - 3 sets - 10 reps   ASSESSMENT:   CLINICAL IMPRESSION: Due to pt arriving 10 minutes late to her appointment, the session was truncated today. Pt continues to show improvement in DNF functional strength, FOTO score, and reported pain levels. She responded well to all interventions today and will continue to benefit from skilled PT to address her primary impairments and return to her prior level of function with  less limitation.     OBJECTIVE IMPAIRMENTS Abnormal gait, decreased balance, decreased mobility, difficulty walking, decreased ROM, decreased strength,  hypomobility, increased edema, increased muscle spasms, impaired flexibility, improper body mechanics, postural dysfunction, and pain.    ACTIVITY LIMITATIONS carrying, lifting, bending, sitting, standing, squatting, sleeping, stairs, transfers, and locomotion level   PARTICIPATION LIMITATIONS: meal prep, cleaning, laundry, interpersonal relationship, driving, shopping, community activity, occupation, and yard work   PERSONAL FACTORS Past/current experiences, Time since onset of injury/illness/exacerbation, and 3+ comorbidities: See medical hx  are also affecting patient's functional outcome.        GOALS: Goals reviewed with patient? Yes   SHORT TERM GOALS: Target date: 11/22/2021  Pt will report understanding and adherence to initial HEP in order to promote independence in the management of primary impairments. Baseline: HEP provided at eval Goal status: MET Pt reports adherence 12/03/21     LONG TERM GOALS: Target date: 12/20/2021    Pt will achieve a cervical FOTO score of 67% in order to demonstrate improved functional ability as it relates to her primary impairments. Baseline: 59% 12/08/2021: 51% 01/18/2022: 60% Goal status: IN PROGRESS   2.  Pt will achieve BIL global parascapular strength of 4+/5 or higher in order to promote improved posture and prophylaxis of future mechanical neck pain Baseline: 3/5 global parascapular strength 12/08/2021: See updated MMT chart 01/03/2022: See updated MMT chart Goal status: ACHIEVED   3.  Pt will achieve global cervical AROM WNL and with 0-2/10 pain in order to get dressed with less limitation. Baseline: See AROM chart 11/16/2021: See updated AROM chart 01/03/2022: See updated AROM (4/10 pain with extension) Goal status: IN PROGRESS   4.  Pt will report ability to complete a full work day, including looking down at her computer screen with 0-3/10 pain in order to complete her work duties with less limitation. Baseline: Pt has >6/10 pain at  the end of her work day 01/03/2022: Pt reports average daily pain at work of 5-6/10 Goal status: IN PROGRESS   5.  *New 01/03/2022* Pt will report ability to sleep through the night without being waken due to pain.   Baseline: Pt woken 5x per night   01/18/2022: Pt continues to be woken 5x per night   Goal status: IN PROGRESS       PLAN: PT FREQUENCY: 1x/week   PT DURATION: 6 weeks   PLANNED INTERVENTIONS: Therapeutic exercises, Therapeutic activity, Neuromuscular re-education, Balance training, Gait training, Patient/Family education, Joint mobilization, Stair training, Aquatic Therapy, Dry Needling, Electrical stimulation, Spinal mobilization, Cryotherapy, Moist heat, Taping, Vasopneumatic device, Traction, Biofeedback, Ionotophoresis 31m/ml Dexamethasone, Manual therapy, and Re-evaluation   PLAN FOR NEXT SESSION: Progress DNF/ parascapular strength/ manual techniques for pain (no OMT due to EDS)   YVanessa Stanley PT, DPT 01/18/22 5:43 PM

## 2022-01-19 DIAGNOSIS — E611 Iron deficiency: Secondary | ICD-10-CM | POA: Diagnosis not present

## 2022-01-25 ENCOUNTER — Ambulatory Visit: Payer: BC Managed Care – PPO | Attending: Student in an Organized Health Care Education/Training Program

## 2022-01-25 DIAGNOSIS — R262 Difficulty in walking, not elsewhere classified: Secondary | ICD-10-CM | POA: Diagnosis not present

## 2022-01-25 DIAGNOSIS — M25551 Pain in right hip: Secondary | ICD-10-CM | POA: Diagnosis not present

## 2022-01-25 DIAGNOSIS — M542 Cervicalgia: Secondary | ICD-10-CM | POA: Diagnosis not present

## 2022-01-25 DIAGNOSIS — M6281 Muscle weakness (generalized): Secondary | ICD-10-CM | POA: Insufficient documentation

## 2022-01-25 NOTE — Therapy (Signed)
OUTPATIENT PHYSICAL THERAPY TREATMENT NOTE   Patient Name: Brianna [name] MRN: [id number] DOB:1996-04-17, 26 y.o., female Today's Date: 01/25/2022  PCP: Josephina Shih, MD REFERRING PROVIDER: Vanetta Mulders, MD  END OF SESSION:   PT End of Session - 01/25/22 1702     Visit Number 11    Number of Visits 14    Date for PT Re-Evaluation 02/21/22    Authorization Type BCBS    Authorization Time Period FOTO v6, v10    PT Start Time 1703    PT Stop Time 1742    PT Time Calculation (min) 39 min    Activity Tolerance Patient tolerated treatment well    Behavior During Therapy [facility] for tasks assessed/performed                     Past Medical History:  Diagnosis Date   Acute recurrent pansinusitis 10/21/2019   ADHD (attention deficit hyperactivity disorder)    inattentive   Anemia    Anxiety    Asthma    Bronchitis    Cat allergy due to both airborne and skin contact 05/01/2016   Chronic sore throat 05/01/2016   COVID 2020   Depression    Fibromyalgia    Gastroparesis    GERD (gastroesophageal reflux disease)    Hypermobile Ehlers-Danlos syndrome    Irregular heart rate    Keratosis pilaris    Migraine    Seasonal allergic rhinitis 03/11/2014   Past Surgical History:  Procedure Laterality Date   ESOPHAGOGASTRODUODENOSCOPY ENDOSCOPY     03/2020   JOINT REPLACEMENT     LABRAL REPAIR Right 03/02/2021   Procedure: RIGHT HIP ARTHROSCOPY WITH LABRAL REPAIR AND PINCE DEBRIDEMENT;  Surgeon: Vanetta Mulders, MD;  Location: [place];  Service: Orthopedics;  Laterality: Right;   SMART PILL PROCEDURE     12/2020   TYMPANOSTOMY TUBE PLACEMENT Bilateral    WISDOM TOOTH EXTRACTION     Patient Active Problem List   Diagnosis Date Noted   Cervical adenopathy 06/10/2021   Chronic pruritus 06/10/2021   Exercise-induced asthma 06/09/2021   Chronic venous insufficiency 06/03/2021   Orthostatic lightheadedness 06/03/2021   Purple toe syndrome of both feet  ([place]) 06/03/2021   Autonomic dysfunction 06/03/2021   Labral tear of hip, degenerative 04/13/2021   Migraine headache 03/01/2021   Headache 11/30/2020   Positive ANA (antinuclear antibody) 11/15/2020   Fibromyalgia 11/02/2020   Hair loss 11/02/2020   Muscle spasticity 10/22/2020   Hypermobile Ehlers-Danlos syndrome 10/22/2020   Paresthesia of bilateral legs 10/06/2020   Myalgia 10/06/2020   Chronic pelvic pain in female 10/06/2020   Chronic abdominal pain 10/06/2020   Irritable colon 08/02/2020   Pelvic floor dysfunction 08/02/2020   Irritant dermatitis 08/02/2020   Gastroesophageal reflux disease 06/28/2020   Severe recurrent major depression without psychotic features ([place]) 06/28/2020   Polyarthralgia 06/28/2020   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 06/28/2020   Tachycardia 06/28/2020   History of COVID-19 06/28/2020   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 deficit 06/28/2020   Recurrent major depressive disorder, in full remission ([place]) 06/28/2020   Atypical chest pain 06/22/2020   Dizziness 06/22/2020   Palpitations 06/14/2020   Anxiety 05/04/2020   Internal and external hemorrhoids without complication 25/95/6387   Nondiabetic gastroparesis 03/03/2020   ETD (Eustachian tube dysfunction), bilateral 10/21/2019   Acne vulgaris 03/11/2014   Mild intermittent asthma without complication 56/43/3295   Seasonal allergies 03/11/2014    REFERRING DIAG: M25.551 (ICD-10-CM) - Pain in right hip  THERAPY DIAG:  Neck pain  Pain in right hip  Muscle weakness (generalized)  Difficulty in walking, not elsewhere classified  Rationale for Evaluation and Treatment Rehabilitation  PERTINENT HISTORY: 03/02/2021: RIGHT HIP ARTHROSCOPY WITH LABRAL REPAIR AND PINCE DEBRIDEMENT, anxiety, depression, fibromyalgia, asthma, Symptomatic Ehler's-Danlos syndrome  PRECAUTIONS: 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  She reports rear delt flies increased her pain following the last session.  Pt reports that she still is benefiting from her most recent corticosteroid injection. She has a Rt hip MRI scheduled for Monday.  PAIN:  Are you having pain? Yes: NPRS scale: 3/10 Pain location: Rt posterior neck/ upper back Pain description: Achy, sharp Aggravating factors: BIL cervical rotation, looking down, looking down at computer screen/ phone Relieving factors: [employer]   OBJECTIVE: (objective measures completed at initial evaluation unless otherwise dated)   DIAGNOSTIC FINDINGS: MR Hip Right with contrast: IMPRESSION: 1. Full-thickness tear of the right anterior superior labrum.   PATIENT SURVEYS:  FOTO Cervical: 59%, predicted 67% in 11 visits; Hip: 58%, predicted 71% in 13 visits 12/08/2021: Cervical: 51%; Hip: 61% 01/18/2022: Cervical: 60%   COGNITION:           Overall cognitive status: Within functional limits for tasks assessed                          SENSATION: Not tested     POSTURE:  Forward head/ BIL rounded shoulders   PALPATION: TTP with palpable trigger points to BIL cervical paraspinals/ UT/ mid trap           PASSIVE ACCESSORIES: Hypermobile/ painful CPAs C2-C3, C5-C7 Hypomobile/ painful CPAs C4-C5, T1-T8   CERVICAL ROM:    AROM A/PROM (deg) 10/25/2021 AROM 11/16/2021 AROM 01/03/2022  Flexion 40p! 68   Extension 65p!! 42p! 60, minor pain  Right lateral flexion 45 45   Left lateral flexion 45 45   Right rotation 61p! 81   Left rotation 68p! 81    (Blank rows = not tested)   UE ROM:   AROM Right 10/25/2021 Left 10/25/2021  Shoulder flexion WNL WNL  Shoulder abduction WNL WNL   (Blank rows = not tested)   UE MMT:   MMT Right 10/25/2021 Left 10/25/2021 Right 12/08/2021 Left 12/08/2021 Right 01/03/2022 Left 01/03/2022  Shoulder flexion 5/5 5/5      Shoulder abduction 5/5 5/5      Cervical flexion 4/5p!     Cervical extension 4/5p!     Cervical side bend 5/5 5/5       Shoulder IR 5/5 5/5      Shoulder ER 5/5 5/5      Mid trap 3/5 3/5 4/5 4/5 4+/5 4+/5  Low trap 3/5 3/5 4/5 4/5 4+/5 4+/5  Latissimus dorsi 3/5 3/5 4+/5 4+/5 4+/5 4+/5   (Blank rows = not tested)   CERVICAL SPECIAL TESTS:  Cervical distraction: (-) Cervical compression: (+) Quadrant testing: (+) BIL     FUNCTIONAL TESTS:  DNF endurance test: 15 seconds    01/03/2022: DNF endurance: 35 seconds    01/18/2022: DNF endurance: 58 seconds     TODAY'S TREATMENT:  [facility] Adult PT Treatment:  DATE: 01/25/2022 Therapeutic Exercise: Prone W lift-off with 2# dumbbells 3x10 Birddogs with knee-elbow taps 2x10 BIL Seated cervical extension SNAGS 10x10 seconds Cervical rotation isotonic contractions against handhold resistance with "break" force 2x10 with 5-sec holds BIL Standing incline press with two 3# cables with chin tuck hold 3x10 Standing low rows with 7# cables 3x10 Standing alternating shoulder extension/ abduction and flexion/ abduction (X pattern pulls) 3x10 with 3# cables Standing arm circle angels 2x6 Standing lt pull-down with two 10# cables 3x10 Manual Therapy: N/A Neuromuscular re-ed: N/A Therapeutic Activity: N/A Modalities: N/A Self Care: N/A   [facility] Adult PT Treatment:                                                DATE: 01/18/2022 Therapeutic Exercise: Bent-over rear delt flies with 2# dumbbells 3x10 Standing BIL shoulder scaption with back and head against wall with 4# dumbbells 3x10 Forearm plank with side-to-side forearm shuffles 3x10 BIL Supine serratus punches with alternating scapular retraction into table with 4# dumbbells 3x12 Seated cervical extension SNAG with towel 2x27mn Manual Therapy: N/A Neuromuscular re-ed: N/A Therapeutic Activity: Re-assessment of objective measures with pt education Re-administration of FOTO with pt education Modalities: N/A Self Care: N/A   [facility] Adult PT Treatment:                                                 DATE: 01/12/2022 Therapeutic Exercise: Quadruped cervical retraction/ protraction against YTB 2x15 Bent-over rear delt fly with 3# cable 2x10 BIL Standing BIL shoulder scaption with back and head against wall with 4# dumbbells 2x10 Manual Therapy: Seated pin and stretch to BIL UT 2x30 seconds BIL Seated effleurage/ STM to BIL UT x5 minutes Supine effleurage to BIL cervical paraspinals and SCM x8 minutes Neuromuscular re-ed: N/A Therapeutic Activity: N/A Modalities: N/A Self Care: N/A      HOME EXERCISE PROGRAM: Access Code: [email or web address]: [email or web address] Date: 10/25/2021 Prepared by: TVanessa Holton  Exercises - Seated Cervical Retraction  - 1 x daily - 7 x weekly - 3 sets - 10 reps - 3-sec hold - Standing Shoulder Row with Anchored Resistance  - 1 x daily - 7 x weekly - 3 sets - 10 reps - 3-sec hold - Low trap slides at wall with lift-off  - 1 x daily - 7 x weekly - 3 sets - 10 reps - 3-sec hold - Shoulder Extension with Resistance  - 1 x daily - 7 x weekly - 3 sets - 10 reps - 3-sec hold  Added 11/16/2021: - Seated Upper Trapezius Stretch  - 1 x daily - 7 x weekly - 2 sets - 1-min hold - Seated Levator Scapulae Stretch  - 1 x daily - 7 x weekly - 2 sets - 1-min hold  Added 12/03/2021: - Seated Assisted Cervical Rotation with Towel  - 1 x daily - 7 x weekly - 3 sets - 10 reps - Cervical Extension AROM with Strap  - 1 x daily - 7 x weekly - 3 sets - 10 reps   ASSESSMENT:   CLINICAL IMPRESSION: Pt continues to respond well to therapeutic exercise, demonstrating good form and no increase in pain during the session today. She will continue to benefit  from skilled PT to address her primary impairments and return to her prior level of function with less limitation.     OBJECTIVE IMPAIRMENTS Abnormal gait, decreased balance, decreased mobility, difficulty walking, decreased ROM, decreased strength, hypomobility,  increased edema, increased muscle spasms, impaired flexibility, improper body mechanics, postural dysfunction, and pain.    ACTIVITY LIMITATIONS carrying, lifting, bending, sitting, standing, squatting, sleeping, stairs, transfers, and locomotion level   PARTICIPATION LIMITATIONS: meal prep, cleaning, laundry, interpersonal relationship, driving, shopping, community activity, occupation, and yard work   PERSONAL FACTORS Past/current experiences, Time since onset of injury/illness/exacerbation, and 3+ comorbidities: See medical hx  are also affecting patient's functional outcome.        GOALS: Goals reviewed with patient? Yes   SHORT TERM GOALS: Target date: 11/22/2021  Pt will report understanding and adherence to initial HEP in order to promote independence in the management of primary impairments. Baseline: HEP provided at eval Goal status: MET Pt reports adherence 12/03/21     LONG TERM GOALS: Target date: 12/20/2021    Pt will achieve a cervical FOTO score of 67% in order to demonstrate improved functional ability as it relates to her primary impairments. Baseline: 59% 12/08/2021: 51% 01/18/2022: 60% Goal status: IN PROGRESS   2.  Pt will achieve BIL global parascapular strength of 4+/5 or higher in order to promote improved posture and prophylaxis of future mechanical neck pain Baseline: 3/5 global parascapular strength 12/08/2021: See updated MMT chart 01/03/2022: See updated MMT chart Goal status: ACHIEVED   3.  Pt will achieve global cervical AROM WNL and with 0-2/10 pain in order to get dressed with less limitation. Baseline: See AROM chart 11/16/2021: See updated AROM chart 01/03/2022: See updated AROM (4/10 pain with extension) Goal status: IN PROGRESS   4.  Pt will report ability to complete a full work day, including looking down at her computer screen with 0-3/10 pain in order to complete her work duties with less limitation. Baseline: Pt has >6/10 pain at the end of her  work day 01/03/2022: Pt reports average daily pain at work of 5-6/10 Goal status: IN PROGRESS   5.  *New 01/03/2022* Pt will report ability to sleep through the night without being waken due to pain.   Baseline: Pt woken 5x per night   01/18/2022: Pt continues to be woken 5x per night   Goal status: IN PROGRESS       PLAN: PT FREQUENCY: 1x/week   PT DURATION: 6 weeks   PLANNED INTERVENTIONS: Therapeutic exercises, Therapeutic activity, Neuromuscular re-education, Balance training, Gait training, Patient/Family education, Joint mobilization, Stair training, Aquatic Therapy, Dry Needling, Electrical stimulation, Spinal mobilization, Cryotherapy, Moist heat, Taping, Vasopneumatic device, Traction, Biofeedback, Ionotophoresis 66m/ml Dexamethasone, Manual therapy, and Re-evaluation   PLAN FOR NEXT SESSION: Progress DNF/ parascapular strength/ manual techniques for pain (no OMT due to EDS)   YVanessa Lindenhurst PT, DPT 01/25/22 5:42 PM

## 2022-01-26 ENCOUNTER — Ambulatory Visit: Payer: BC Managed Care – PPO | Admitting: Obstetrics and Gynecology

## 2022-01-26 DIAGNOSIS — E611 Iron deficiency: Secondary | ICD-10-CM | POA: Diagnosis not present

## 2022-01-27 DIAGNOSIS — F411 Generalized anxiety disorder: Secondary | ICD-10-CM | POA: Diagnosis not present

## 2022-01-27 DIAGNOSIS — F3341 Major depressive disorder, recurrent, in partial remission: Secondary | ICD-10-CM | POA: Diagnosis not present

## 2022-01-27 DIAGNOSIS — Q796 Ehlers-Danlos syndrome, unspecified: Secondary | ICD-10-CM | POA: Diagnosis not present

## 2022-01-27 DIAGNOSIS — R45851 Suicidal ideations: Secondary | ICD-10-CM | POA: Diagnosis not present

## 2022-01-29 DIAGNOSIS — F411 Generalized anxiety disorder: Secondary | ICD-10-CM | POA: Diagnosis not present

## 2022-01-29 DIAGNOSIS — F5081 Binge eating disorder: Secondary | ICD-10-CM | POA: Diagnosis not present

## 2022-01-29 DIAGNOSIS — L989 Disorder of the skin and subcutaneous tissue, unspecified: Secondary | ICD-10-CM | POA: Diagnosis not present

## 2022-01-29 DIAGNOSIS — G894 Chronic pain syndrome: Secondary | ICD-10-CM | POA: Diagnosis not present

## 2022-01-29 DIAGNOSIS — F3341 Major depressive disorder, recurrent, in partial remission: Secondary | ICD-10-CM | POA: Diagnosis not present

## 2022-01-30 ENCOUNTER — Ambulatory Visit
Admission: RE | Admit: 2022-01-30 | Discharge: 2022-01-30 | Disposition: A | Discharge status: Home / Self Care | Payer: BC Managed Care – PPO | Source: Ambulatory Visit | Attending: Orthopaedic Surgery | Admitting: Orthopaedic Surgery

## 2022-01-30 DIAGNOSIS — S73191D Other sprain of right hip, subsequent encounter: Secondary | ICD-10-CM

## 2022-01-30 DIAGNOSIS — M25651 Stiffness of right hip, not elsewhere classified: Secondary | ICD-10-CM | POA: Diagnosis not present

## 2022-01-30 DIAGNOSIS — S73191A Other sprain of right hip, initial encounter: Secondary | ICD-10-CM | POA: Diagnosis not present

## 2022-01-30 MED ORDER — IOPAMIDOL (ISOVUE-M 200) INJECTION 41%
15.0000 mL | Freq: Once | INTRAMUSCULAR | Status: AC
  Administered 2022-01-30: 15 mL via INTRA_ARTICULAR

## 2022-02-01 ENCOUNTER — Ambulatory Visit: Payer: BC Managed Care – PPO

## 2022-02-01 ENCOUNTER — Ambulatory Visit (INDEPENDENT_AMBULATORY_CARE_PROVIDER_SITE_OTHER): Payer: BC Managed Care – PPO | Admitting: Orthopaedic Surgery

## 2022-02-01 VITALS — Ht 64.0 in | Wt 109.0 lb

## 2022-02-01 DIAGNOSIS — S73191D Other sprain of right hip, subsequent encounter: Secondary | ICD-10-CM

## 2022-02-01 DIAGNOSIS — F411 Generalized anxiety disorder: Secondary | ICD-10-CM | POA: Diagnosis not present

## 2022-02-01 NOTE — Progress Notes (Signed)
Post Operative Evaluation    Procedure/Date of Surgery: Right hip arthroscopy with labral repair, pincer debridement 03/02/21  Interval History:   02/01/2022: Today for follow-up of her MRI of the right hip.  She continues to have pain.  She did get some relief from the psoas injection at the previous visit.  She continues to experience painful popping and clicking in the hip and this is feeling overall unstable.  She has tried additional physical therapy with limited relief.  She is here today for further assessment   PMH/PSH/Family History/Social History/Meds/Allergies:    Past Medical History:  Diagnosis Date   Acute recurrent pansinusitis 10/21/2019   ADHD (attention deficit hyperactivity disorder)    inattentive   Anemia    Anxiety    Asthma    Bronchitis    Cat allergy due to both airborne and skin contact 05/01/2016   Chronic sore throat 05/01/2016   COVID 2020   Depression    Fibromyalgia    Gastroparesis    GERD (gastroesophageal reflux disease)    Hypermobile Ehlers-Danlos syndrome    Irregular heart rate    Keratosis pilaris    Migraine    Seasonal allergic rhinitis 03/11/2014   Past Surgical History:  Procedure Laterality Date   ESOPHAGOGASTRODUODENOSCOPY ENDOSCOPY     03/2020   JOINT REPLACEMENT     LABRAL REPAIR Right 03/02/2021   Procedure: RIGHT HIP ARTHROSCOPY WITH LABRAL REPAIR AND PINCE DEBRIDEMENT;  Surgeon: [name], MD;  Location: [facility];  Service: Orthopedics;  Laterality: Right;   SMART PILL PROCEDURE     12/2020   TYMPANOSTOMY TUBE PLACEMENT Bilateral    WISDOM TOOTH EXTRACTION     Social History   Socioeconomic History   Marital status: Single    Spouse name: Not on file   Number of children: Not on file   Years of education: Not on file   Highest education level: Not on file  Occupational History   Not on file  Tobacco Use   Smoking status: Former    Types: E-cigarettes, Cigarettes    Quit  date: 2021    Years since quitting: 2.6   Smokeless tobacco: Never  Vaping Use   Vaping Use: Former   Substances: Financial trader  Substance and Sexual Activity   Alcohol use: Not Currently   Drug use: Yes    Types: Marijuana    Comment: last use 7 days ago   Sexual activity: Yes    Birth control/protection: None  Other Topics Concern   Not on file  Social History Narrative   Right handed   Social Determinants of Health   Financial Resource Strain: Not on file  Food Insecurity: Not on file  Transportation Needs: Not on file  Physical Activity: Not on file  Stress: Not on file  Social Connections: Not on file   Family History  Problem Relation Age of Onset   Diabetes Mother    Polycystic ovary syndrome Mother    GER disease Father    Heart disease Paternal Uncle    Allergies  Allergen Reactions   Clindamycin Hives, Swelling and Rash   Tretinoin Dermatitis, Itching, Photosensitivity and Swelling   Current Outpatient Medications  Medication Sig Dispense Refill   Adapalene 0.3 % gel Apply topically.     etonogestrel-ethinyl estradiol (NUVARING) 0.12-0.015 MG/24HR  vaginal ring INSERT 1 VAGINAL RING EVERY MONTH BY VAGINAL ROUTE     Ferrous Sulfate Dried (EQ SLOW-RELEASE IRON) 45 MG TBCR Take 1 tablet by mouth every 3 (three) days.     finasteride (PROPECIA) 1 MG tablet Take 1 mg by mouth daily.     Galcanezumab-gnlm (EMGALITY) 120 MG/ML SOAJ      lamoTRIgine (LAMICTAL) 25 MG tablet      lidocaine (LIDODERM) 5 % Place 1 patch onto the skin daily. Remove & Discard patch within 12 hours or as directed by MD 5 patch 1   naratriptan (AMERGE) 2.5 MG tablet Take by mouth.     Omega-3 Fatty Acids (FISH OIL PO) Take 700 Units by mouth.     ondansetron (ZOFRAN ODT) 4 MG disintegrating tablet Take 1 tablet (4 mg total) by mouth every 8 (eight) hours as needed for nausea or vomiting. 20 tablet 5   Pediatric Multiple Vitamins (PEDIATRIC MULTIVITAMIN) chewable tablet Chew 1  tablet by mouth daily.     promethazine (PHENERGAN) 25 MG tablet Take 1 tablet (25 mg total) by mouth every 6 (six) hours as needed for nausea or vomiting. 30 tablet 0   No current facility-administered medications for this visit.   No results found.  Review of Systems:   A ROS was performed including pertinent positives and negatives as documented in the HPI.   Musculoskeletal Exam:     Inspection Right Left  Skin No atrophy or gross abnormalities appreciated No atrophy or gross abnormalities appreciated  Palpation    Tenderness None None  Crepitus None None  Range of Motion    Flexion (passive) 120 120  Extension 30 30  IR 45 with pain 45  ER 50 50  Strength    Flexion  5/5 5/5  Extension 5/5 5/5  Special Tests    FABER Negative Negative  FADIR Positive Negative  ER Lag/Capsular Insufficiency Negative Negative  Instability Negative Negative  Sacroiliac pain Negative  Negative   Instability    Generalized Laxity No No  Neurologic    sciatic, femoral, obturator nerves intact to light sensation  Vascular/Lymphatic    DP pulse 2+ 2+  Lumbar Exam    Patient has symmetric lumbar range of motion with negative pain referral to hip    Imaging:    None  I personally reviewed and interpreted the radiographs.   Assessment:   26 year old female status post right hip arthroscopic labral repair.  Postoperatively she continues to have pain and clicking with residual hip instability type symptoms.  Given the fact that she has trialed additional physical therapy and is overall continuing to have hip pain and instability I do believe that she would be a candidate for right hip labral reconstruction.  I did discuss that given that this would be revision surgery that overall I cannot guarantee a perfect outcome.  I did discuss that with her history of mixed tissue connective disorders I am specifically concerned of the hypoplastic nature of her labrum and's ability to maintain a  suction seal.  I did discuss that given that she has had a previous relief with psoas injection I would plan to perform a psoas release at that time as well.  Overall I did continue to discuss nonoperative management include continued physical therapy.  At this time she is not satisfied with the quality of her hip and is hoping to undergo revision surgery in order to hopefully get her a stable and improved hip Plan :    -  Plan for right hip arthroscopy with labral reconstruction and psoas release   After a lengthy discussion of treatment options, including risks, benefits, alternatives, complications of surgical and nonsurgical conservative options, the patient elected surgical repair.   The patient  is aware of the material risks  and complications including, but not limited to injury to adjacent structures, neurovascular injury, infection, numbness, bleeding, implant failure, thermal burns, stiffness, persistent pain, failure to heal, disease transmission from allograft, need for further surgery, dislocation, anesthetic risks, blood clots, risks of death,and others. The probabilities of surgical success and failure discussed with patient given their particular co-morbidities.The time and nature of expected rehabilitation and recovery was discussed.The patient's questions were all answered preoperatively.  No barriers to understanding were noted. I explained the natural history of the disease process and Rx rationale.  I explained to the patient what I considered to be reasonable expectations given their personal situation.  The final treatment plan was arrived at through a shared patient decision making process model.    I personally saw and evaluated the patient, and participated in the management and treatment plan.  [name], MD Attending Physician, Orthopedic Surgery  This document was dictated using Dragon voice recognition software. A reasonable attempt at proof reading has been made  to minimize errors.

## 2022-02-02 DIAGNOSIS — E611 Iron deficiency: Secondary | ICD-10-CM | POA: Diagnosis not present

## 2022-02-03 DIAGNOSIS — H43399 Other vitreous opacities, unspecified eye: Secondary | ICD-10-CM | POA: Diagnosis not present

## 2022-02-04 ENCOUNTER — Ambulatory Visit: Payer: BC Managed Care – PPO

## 2022-02-04 DIAGNOSIS — M6281 Muscle weakness (generalized): Secondary | ICD-10-CM

## 2022-02-04 DIAGNOSIS — M25551 Pain in right hip: Secondary | ICD-10-CM

## 2022-02-04 DIAGNOSIS — M542 Cervicalgia: Secondary | ICD-10-CM

## 2022-02-04 DIAGNOSIS — R262 Difficulty in walking, not elsewhere classified: Secondary | ICD-10-CM | POA: Diagnosis not present

## 2022-02-04 NOTE — Therapy (Signed)
OUTPATIENT PHYSICAL THERAPY TREATMENT NOTE   Patient Name: Brianna [name] MRN: [id number] DOB:May 11, 1996, 26 y.o., female Today's Date: 02/04/2022  PCP: Josephina Shih, MD REFERRING PROVIDER: Vanetta Mulders, MD  END OF SESSION:   PT End of Session - 02/04/22 0949     Visit Number 12    Number of Visits 14    Date for PT Re-Evaluation 02/21/22    Authorization Type BCBS    Authorization Time Period FOTO v6, v10    PT Start Time 0948    PT Stop Time 1027    PT Time Calculation (min) 39 min    Activity Tolerance Patient tolerated treatment well    Behavior During Therapy [facility] for tasks assessed/performed                      Past Medical History:  Diagnosis Date   Acute recurrent pansinusitis 10/21/2019   ADHD (attention deficit hyperactivity disorder)    inattentive   Anemia    Anxiety    Asthma    Bronchitis    Cat allergy due to both airborne and skin contact 05/01/2016   Chronic sore throat 05/01/2016   COVID 2020   Depression    Fibromyalgia    Gastroparesis    GERD (gastroesophageal reflux disease)    Hypermobile Ehlers-Danlos syndrome    Irregular heart rate    Keratosis pilaris    Migraine    Seasonal allergic rhinitis 03/11/2014   Past Surgical History:  Procedure Laterality Date   ESOPHAGOGASTRODUODENOSCOPY ENDOSCOPY     03/2020   JOINT REPLACEMENT     LABRAL REPAIR Right 03/02/2021   Procedure: RIGHT HIP ARTHROSCOPY WITH LABRAL REPAIR AND PINCE DEBRIDEMENT;  Surgeon: Vanetta Mulders, MD;  Location: [facility];  Service: Orthopedics;  Laterality: Right;   SMART PILL PROCEDURE     12/2020   TYMPANOSTOMY TUBE PLACEMENT Bilateral    WISDOM TOOTH EXTRACTION     Patient Active Problem List   Diagnosis Date Noted   Cervical adenopathy 06/10/2021   Chronic pruritus 06/10/2021   Exercise-induced asthma 06/09/2021   Chronic venous insufficiency 06/03/2021   Orthostatic lightheadedness 06/03/2021   Purple toe syndrome of both feet  ([place]) 06/03/2021   Autonomic dysfunction 06/03/2021   Labral tear of hip, degenerative 04/13/2021   Migraine headache 03/01/2021   Headache 11/30/2020   Positive ANA (antinuclear antibody) 11/15/2020   Fibromyalgia 11/02/2020   Hair loss 11/02/2020   Muscle spasticity 10/22/2020   Hypermobile Ehlers-Danlos syndrome 10/22/2020   Paresthesia of bilateral legs 10/06/2020   Myalgia 10/06/2020   Chronic pelvic pain in female 10/06/2020   Chronic abdominal pain 10/06/2020   Irritable colon 08/02/2020   Pelvic floor dysfunction 08/02/2020   Irritant dermatitis 08/02/2020   Gastroesophageal reflux disease 06/28/2020   Severe recurrent major depression without psychotic features ([place]) 06/28/2020   Polyarthralgia 06/28/2020   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 06/28/2020   Tachycardia 06/28/2020   History of COVID-19 06/28/2020   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 deficit 06/28/2020   Recurrent major depressive disorder, in full remission ([place]) 06/28/2020   Atypical chest pain 06/22/2020   Dizziness 06/22/2020   Palpitations 06/14/2020   Anxiety 05/04/2020   Internal and external hemorrhoids without complication 88/28/0034   Nondiabetic gastroparesis 03/03/2020   ETD (Eustachian tube dysfunction), bilateral 10/21/2019   Acne vulgaris 03/11/2014   Mild intermittent asthma without complication 91/79/1505   Seasonal allergies 03/11/2014    REFERRING DIAG: M25.551 (ICD-10-CM) - Pain in right  hip  THERAPY DIAG:  Neck pain  Pain in right hip  Muscle weakness (generalized)  Difficulty in walking, not elsewhere classified  Rationale for Evaluation and Treatment Rehabilitation  PERTINENT HISTORY: 03/02/2021: RIGHT HIP ARTHROSCOPY WITH LABRAL REPAIR AND PINCE DEBRIDEMENT, anxiety, depression, fibromyalgia, asthma, Symptomatic Ehler's-Danlos syndrome  PRECAUTIONS: 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 She reports she got a new pillow and is hopeful this will help. She rates her Rt hip pain as 1-2/10 currently. Pt also reports she followed up with Dr. Sammuel Hines regarding her Rt hip and got an MRI which showed a full thickness labral tear. Pt reports she would like to focus on hip strengthening moving forward as a pre-op program.   PAIN:  Are you having pain? Yes: NPRS scale: 4/10 Pain location: Rt posterior neck/ upper back Pain description: Achy, sharp Aggravating factors: BIL cervical rotation, looking down, looking down at computer screen/ phone Relieving factors: [employer]   OBJECTIVE: (objective measures completed at initial evaluation unless otherwise dated)   DIAGNOSTIC FINDINGS: MR Hip Right with contrast: IMPRESSION: 1. Full-thickness tear of the right anterior superior labrum.   PATIENT SURVEYS:  FOTO Cervical: 59%, predicted 67% in 11 visits; Hip: 58%, predicted 71% in 13 visits 12/08/2021: Cervical: 51%; Hip: 61% 01/18/2022: Cervical: 60%   COGNITION:           Overall cognitive status: Within functional limits for tasks assessed                          SENSATION: Not tested     POSTURE:  Forward head/ BIL rounded shoulders   PALPATION: TTP with palpable trigger points to BIL cervical paraspinals/ UT/ mid trap           PASSIVE ACCESSORIES: Hypermobile/ painful CPAs C2-C3, C5-C7 Hypomobile/ painful CPAs C4-C5, T1-T8   CERVICAL ROM:    AROM A/PROM (deg) 10/25/2021 AROM 11/16/2021 AROM 01/03/2022  Flexion 40p! 68   Extension 65p!! 42p! 60, minor pain  Right lateral flexion 45 45   Left lateral flexion 45 45   Right rotation 61p! 81   Left rotation 68p! 81    (Blank rows = not tested)   UE ROM:   AROM Right 10/25/2021 Left 10/25/2021  Shoulder flexion WNL WNL  Shoulder abduction WNL WNL   (Blank rows = not tested)   UE MMT:   MMT Right 10/25/2021 Left 10/25/2021 Right 12/08/2021 Left 12/08/2021  Right 01/03/2022 Left 01/03/2022  Shoulder flexion 5/5 5/5      Shoulder abduction 5/5 5/5      Cervical flexion 4/5p!     Cervical extension 4/5p!     Cervical side bend 5/5 5/5      Shoulder IR 5/5 5/5      Shoulder ER 5/5 5/5      Mid trap 3/5 3/5 4/5 4/5 4+/5 4+/5  Low trap 3/5 3/5 4/5 4/5 4+/5 4+/5  Latissimus dorsi 3/5 3/5 4+/5 4+/5 4+/5 4+/5   (Blank rows = not tested)   CERVICAL SPECIAL TESTS:  Cervical distraction: (-) Cervical compression: (+) Quadrant testing: (+) BIL     FUNCTIONAL TESTS:  DNF endurance test: 15 seconds    01/03/2022: DNF endurance: 35 seconds    01/18/2022: DNF endurance: 58 seconds     TODAY'S TREATMENT:  [facility] Adult PT Treatment:  DATE: 02/04/2022 Therapeutic Exercise: Supine mini bicycle kicks with handhold thigh resistance 3x20 Bridge isometric with mini-marching 3x20 Side knee plank with hip abduction 3x10 BIL Mini deadlift with 10# kettlebell with UE support 3x10 BIL Kickstand stance Pallof press into hip ER with 3# cable x10  Kickstand stance Pallof press into hip IR with 7# cable x10  Manual Therapy: N/A Neuromuscular re-ed: N/A Therapeutic Activity: N/A Modalities: N/A Self Care: N/A   [facility] Adult PT Treatment:                                                DATE: 01/25/2022 Therapeutic Exercise: Prone W lift-off with 2# dumbbells 3x10 Birddogs with knee-elbow taps 2x10 BIL Seated cervical extension SNAGS 10x10 seconds Cervical rotation isotonic contractions against handhold resistance with "break" force 2x10 with 5-sec holds BIL Standing incline press with two 3# cables with chin tuck hold 3x10 Standing low rows with 7# cables 3x10 Standing alternating shoulder extension/ abduction and flexion/ abduction (X pattern pulls) 3x10 with 3# cables Standing arm circle angels 2x6 Standing lt pull-down with two 10# cables 3x10 Manual Therapy: N/A Neuromuscular re-ed: N/A Therapeutic  Activity: N/A Modalities: N/A Self Care: N/A   [facility] Adult PT Treatment:                                                DATE: 01/18/2022 Therapeutic Exercise: Bent-over rear delt flies with 2# dumbbells 3x10 Standing BIL shoulder scaption with back and head against wall with 4# dumbbells 3x10 Forearm plank with side-to-side forearm shuffles 3x10 BIL Supine serratus punches with alternating scapular retraction into table with 4# dumbbells 3x12 Seated cervical extension SNAG with towel 2x8mn Manual Therapy: N/A Neuromuscular re-ed: N/A Therapeutic Activity: Re-assessment of objective measures with pt education Re-administration of FOTO with pt education Modalities: N/A Self Care: N/A       HOME EXERCISE PROGRAM: Access Code: [email or web address]: [email or web address] Date: 10/25/2021 Prepared by: TVanessa [place]  Exercises - Seated Cervical Retraction  - 1 x daily - 7 x weekly - 3 sets - 10 reps - 3-sec hold - Standing Shoulder Row with Anchored Resistance  - 1 x daily - 7 x weekly - 3 sets - 10 reps - 3-sec hold - Low trap slides at wall with lift-off  - 1 x daily - 7 x weekly - 3 sets - 10 reps - 3-sec hold - Shoulder Extension with Resistance  - 1 x daily - 7 x weekly - 3 sets - 10 reps - 3-sec hold  Added 11/16/2021: - Seated Upper Trapezius Stretch  - 1 x daily - 7 x weekly - 2 sets - 1-min hold - Seated Levator Scapulae Stretch  - 1 x daily - 7 x weekly - 2 sets - 1-min hold  Added 12/03/2021: - Seated Assisted Cervical Rotation with Towel  - 1 x daily - 7 x weekly - 3 sets - 10 reps - Cervical Extension AROM with Strap  - 1 x daily - 7 x weekly - 3 sets - 10 reps   ASSESSMENT:   CLINICAL IMPRESSION: Due to recent Rt hip MRI demonstrating recurrent full-thickness hip labral tear, along with the pt electing to undergo Rt hip labral reconstruction surgery toward  the end of the year, treatment will focus on hip strengthening for pre-op preparation. She  responded well to hip strengthening exercises today, demonstrating good form and no pain with all exercises. She will continue to benefit from skilled PT to address her primary impairments and return to her prior level of function with less limitation.      OBJECTIVE IMPAIRMENTS Abnormal gait, decreased balance, decreased mobility, difficulty walking, decreased ROM, decreased strength, hypomobility, increased edema, increased muscle spasms, impaired flexibility, improper body mechanics, postural dysfunction, and pain.    ACTIVITY LIMITATIONS carrying, lifting, bending, sitting, standing, squatting, sleeping, stairs, transfers, and locomotion level   PARTICIPATION LIMITATIONS: meal prep, cleaning, laundry, interpersonal relationship, driving, shopping, community activity, occupation, and yard work   PERSONAL FACTORS Past/current experiences, Time since onset of injury/illness/exacerbation, and 3+ comorbidities: See medical hx  are also affecting patient's functional outcome.        GOALS: Goals reviewed with patient? Yes   SHORT TERM GOALS: Target date: 11/22/2021  Pt will report understanding and adherence to initial HEP in order to promote independence in the management of primary impairments. Baseline: HEP provided at eval Goal status: MET Pt reports adherence 12/03/21     LONG TERM GOALS: Target date: 12/20/2021    Pt will achieve a cervical FOTO score of 67% in order to demonstrate improved functional ability as it relates to her primary impairments. Baseline: 59% 12/08/2021: 51% 01/18/2022: 60% Goal status: IN PROGRESS   2.  Pt will achieve BIL global parascapular strength of 4+/5 or higher in order to promote improved posture and prophylaxis of future mechanical neck pain Baseline: 3/5 global parascapular strength 12/08/2021: See updated MMT chart 01/03/2022: See updated MMT chart Goal status: ACHIEVED   3.  Pt will achieve global cervical AROM WNL and with 0-2/10 pain in order to  get dressed with less limitation. Baseline: See AROM chart 11/16/2021: See updated AROM chart 01/03/2022: See updated AROM (4/10 pain with extension) Goal status: IN PROGRESS   4.  Pt will report ability to complete a full work day, including looking down at her computer screen with 0-3/10 pain in order to complete her work duties with less limitation. Baseline: Pt has >6/10 pain at the end of her work day 01/03/2022: Pt reports average daily pain at work of 5-6/10 Goal status: IN PROGRESS   5.  *New 01/03/2022* Pt will report ability to sleep through the night without being waken due to pain.   Baseline: Pt woken 5x per night   01/18/2022: Pt continues to be woken 5x per night   Goal status: IN PROGRESS       PLAN: PT FREQUENCY: 1x/week   PT DURATION: 6 weeks   PLANNED INTERVENTIONS: Therapeutic exercises, Therapeutic activity, Neuromuscular re-education, Balance training, Gait training, Patient/Family education, Joint mobilization, Stair training, Aquatic Therapy, Dry Needling, Electrical stimulation, Spinal mobilization, Cryotherapy, Moist heat, Taping, Vasopneumatic device, Traction, Biofeedback, Ionotophoresis 28m/ml Dexamethasone, Manual therapy, and Re-evaluation   PLAN FOR NEXT SESSION: Progress DNF/ parascapular strength/ manual techniques for pain (no OMT due to EDS)   YVanessa  PT, DPT 02/04/22 10:27 AM

## 2022-02-05 DIAGNOSIS — G894 Chronic pain syndrome: Secondary | ICD-10-CM | POA: Diagnosis not present

## 2022-02-05 DIAGNOSIS — F5081 Binge eating disorder: Secondary | ICD-10-CM | POA: Diagnosis not present

## 2022-02-05 DIAGNOSIS — F3341 Major depressive disorder, recurrent, in partial remission: Secondary | ICD-10-CM | POA: Diagnosis not present

## 2022-02-05 DIAGNOSIS — F411 Generalized anxiety disorder: Secondary | ICD-10-CM | POA: Diagnosis not present

## 2022-02-06 NOTE — Progress Notes (Unsigned)
   ANNUAL EXAM Patient name: Opie Fanton MRN [id number]  Date of birth: January 07, 1996 Chief Complaint:   No chief complaint on file.  History of Present Illness:   Brianna [name] is a 26 y.o. G31P0010 female being seen today for a routine annual exam.   Current complaints: ***  She would like OCPs. She had tried the Nuvaring but ***.   Patient's last menstrual period was 01/30/2022 (exact date).    Last pap: 12/2020. Results were: NILM w/ HRHPV not done. H/O abnormal pap: no   Health Maintenance Due  Topic Date Due   HPV VACCINES (2 - 3-dose series) 10/02/2012   INFLUENZA VACCINE  12/20/2021    Review of Systems:   Pertinent items are noted in HPI Denies any headaches, blurred vision, fatigue, shortness of breath, chest pain, abdominal pain, abnormal vaginal discharge/itching/odor/irritation, problems with periods, bowel movements, urination, or intercourse unless otherwise stated above. *** Pertinent History Reviewed:  Reviewed past medical,surgical, social and family history.  Reviewed problem list, medications and allergies. Physical Assessment:  There were no vitals filed for this visit.There is no height or weight on file to calculate BMI.   Physical Examination:  General appearance - well appearing, and in no distress Mental status - alert, oriented to person, place, and time Psych:  She has a normal mood and affect Skin - warm and dry, normal color, no suspicious lesions noted Chest - effort normal Heart - normal rate  Breasts - breasts appear normal, no suspicious masses, no skin or nipple changes or axillary nodes Abdomen - soft, nontender, nondistended, no masses or organomegaly Pelvic -  VULVA: normal appearing vulva with no masses, tenderness or lesions  VAGINA: normal appearing vagina with normal color and discharge, no lesions  CERVIX: normal appearing cervix without discharge or lesions, no CMT UTERUS: uterus is felt to be normal size, shape,  consistency and nontender  ADNEXA: No adnexal masses or tenderness noted. Extremities:  No swelling or varicosities noted  Chaperone present for exam  No results found for this or any previous visit (from the past 24 hour(s)).  Assessment & Plan:  Diagnoses and all orders for this visit:  Encounter for annual routine gynecological examination  Counseling for birth control, oral contraceptives   - Cervical cancer screening: Discussed screening Q3 years. Reviewed importance of annual exams and limits of pap smear. Pap with reflex HPV wnl 12/2020 - GC/CT: Discussed and recommended. Pt  {Blank single:19197::"accepts","declines"} - Gardasil: completed - Birth Control: OCPs - Breast Health: Encouraged self breast awareness/exams. Teaching provided. - Follow-up: 12 months and prn    No orders of the defined types were placed in this encounter.   Meds: No orders of the defined types were placed in this encounter.   Follow-up: No follow-ups on file.  Radene Gunning, MD 02/06/2022 10:15 AM

## 2022-02-07 ENCOUNTER — Encounter (HOSPITAL_BASED_OUTPATIENT_CLINIC_OR_DEPARTMENT_OTHER): Payer: Self-pay | Admitting: Orthopaedic Surgery

## 2022-02-08 ENCOUNTER — Encounter (HOSPITAL_BASED_OUTPATIENT_CLINIC_OR_DEPARTMENT_OTHER): Payer: Self-pay | Admitting: Orthopaedic Surgery

## 2022-02-08 ENCOUNTER — Ambulatory Visit: Payer: BC Managed Care – PPO

## 2022-02-08 ENCOUNTER — Other Ambulatory Visit (HOSPITAL_BASED_OUTPATIENT_CLINIC_OR_DEPARTMENT_OTHER): Payer: Self-pay | Admitting: Orthopaedic Surgery

## 2022-02-08 DIAGNOSIS — R262 Difficulty in walking, not elsewhere classified: Secondary | ICD-10-CM | POA: Diagnosis not present

## 2022-02-08 DIAGNOSIS — F3341 Major depressive disorder, recurrent, in partial remission: Secondary | ICD-10-CM | POA: Diagnosis not present

## 2022-02-08 DIAGNOSIS — M542 Cervicalgia: Secondary | ICD-10-CM | POA: Diagnosis not present

## 2022-02-08 DIAGNOSIS — Q796 Ehlers-Danlos syndrome, unspecified: Secondary | ICD-10-CM | POA: Diagnosis not present

## 2022-02-08 DIAGNOSIS — M6281 Muscle weakness (generalized): Secondary | ICD-10-CM

## 2022-02-08 DIAGNOSIS — M25551 Pain in right hip: Secondary | ICD-10-CM

## 2022-02-08 DIAGNOSIS — R45851 Suicidal ideations: Secondary | ICD-10-CM | POA: Diagnosis not present

## 2022-02-08 DIAGNOSIS — F411 Generalized anxiety disorder: Secondary | ICD-10-CM | POA: Diagnosis not present

## 2022-02-08 DIAGNOSIS — S73191D Other sprain of right hip, subsequent encounter: Secondary | ICD-10-CM

## 2022-02-08 NOTE — Therapy (Signed)
OUTPATIENT PHYSICAL THERAPY TREATMENT NOTE   Patient Name: Brianna [name] MRN: [id number] DOB:10-24-1995, 26 y.o., female Today's Date: 02/08/2022  PCP: Josephina Shih, MD REFERRING PROVIDER: Vanetta Mulders, MD  END OF SESSION:   PT End of Session - 02/08/22 1809     Visit Number 13    Number of Visits 14    Date for PT Re-Evaluation 02/21/22    Authorization Type BCBS    Authorization Time Period FOTO v6, v10    PT Start Time 7494    PT Stop Time 1827    PT Time Calculation (min) 39 min    Activity Tolerance Patient tolerated treatment well    Behavior During Therapy [facility] for tasks assessed/performed                       Past Medical History:  Diagnosis Date   Acute recurrent pansinusitis 10/21/2019   ADHD (attention deficit hyperactivity disorder)    inattentive   Anemia    Anxiety    Asthma    Bronchitis    Cat allergy due to both airborne and skin contact 05/01/2016   Chronic sore throat 05/01/2016   COVID 2020   Depression    Fibromyalgia    Gastroparesis    GERD (gastroesophageal reflux disease)    Hypermobile Ehlers-Danlos syndrome    Irregular heart rate    Keratosis pilaris    Migraine    Seasonal allergic rhinitis 03/11/2014   Past Surgical History:  Procedure Laterality Date   ESOPHAGOGASTRODUODENOSCOPY ENDOSCOPY     03/2020   JOINT REPLACEMENT     LABRAL REPAIR Right 03/02/2021   Procedure: RIGHT HIP ARTHROSCOPY WITH LABRAL REPAIR AND PINCE DEBRIDEMENT;  Surgeon: Vanetta Mulders, MD;  Location: [facility];  Service: Orthopedics;  Laterality: Right;   SMART PILL PROCEDURE     12/2020   TYMPANOSTOMY TUBE PLACEMENT Bilateral    WISDOM TOOTH EXTRACTION     Patient Active Problem List   Diagnosis Date Noted   Cervical adenopathy 06/10/2021   Chronic pruritus 06/10/2021   Exercise-induced asthma 06/09/2021   Chronic venous insufficiency 06/03/2021   Orthostatic lightheadedness 06/03/2021   Purple toe syndrome of both feet  ([place]) 06/03/2021   Autonomic dysfunction 06/03/2021   Labral tear of hip, degenerative 04/13/2021   Migraine headache 03/01/2021   Headache 11/30/2020   Positive ANA (antinuclear antibody) 11/15/2020   Fibromyalgia 11/02/2020   Hair loss 11/02/2020   Muscle spasticity 10/22/2020   Hypermobile Ehlers-Danlos syndrome 10/22/2020   Paresthesia of bilateral legs 10/06/2020   Myalgia 10/06/2020   Chronic pelvic pain in female 10/06/2020   Chronic abdominal pain 10/06/2020   Irritable colon 08/02/2020   Pelvic floor dysfunction 08/02/2020   Irritant dermatitis 08/02/2020   Gastroesophageal reflux disease 06/28/2020   Severe recurrent major depression without psychotic features ([place]) 06/28/2020   Polyarthralgia 06/28/2020   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 06/28/2020   Tachycardia 06/28/2020   History of COVID-19 06/28/2020   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 deficit 06/28/2020   Recurrent major depressive disorder, in full remission ([place]) 06/28/2020   Atypical chest pain 06/22/2020   Dizziness 06/22/2020   Palpitations 06/14/2020   Anxiety 05/04/2020   Internal and external hemorrhoids without complication 49/67/5916   Nondiabetic gastroparesis 03/03/2020   ETD (Eustachian tube dysfunction), bilateral 10/21/2019   Acne vulgaris 03/11/2014   Mild intermittent asthma without complication 38/46/6599   Seasonal allergies 03/11/2014    REFERRING DIAG: M25.551 (ICD-10-CM) - Pain in  right hip  THERAPY DIAG:  Neck pain  Pain in right hip  Muscle weakness (generalized)  Difficulty in walking, not elsewhere classified  Rationale for Evaluation and Treatment Rehabilitation  PERTINENT HISTORY: 03/02/2021: RIGHT HIP ARTHROSCOPY WITH LABRAL REPAIR AND PINCE DEBRIDEMENT, anxiety, depression, fibromyalgia, asthma, Symptomatic Ehler's-Danlos syndrome  PRECAUTIONS: 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 She would like to continue hip strengthening as a pre-op program.   PAIN:  Are you having pain? Yes: NPRS scale: 4/10 Pain location: Rt posterior neck/ upper back Pain description: Achy, sharp Aggravating factors: BIL cervical rotation, looking down, looking down at computer screen/ phone Relieving factors: IcyHot   OBJECTIVE: (objective measures completed at initial evaluation unless otherwise dated)   DIAGNOSTIC FINDINGS: MR Hip Right with contrast: IMPRESSION: 1. Full-thickness tear of the right anterior superior labrum.   PATIENT SURVEYS:  FOTO Cervical: 59%, predicted 67% in 11 visits; Hip: 58%, predicted 71% in 13 visits 12/08/2021: Cervical: 51%; Hip: 61% 01/18/2022: Cervical: 60%   COGNITION:           Overall cognitive status: Within functional limits for tasks assessed                          SENSATION: Not tested     POSTURE:  Forward head/ BIL rounded shoulders   PALPATION: TTP with palpable trigger points to BIL cervical paraspinals/ UT/ mid trap           PASSIVE ACCESSORIES: Hypermobile/ painful CPAs C2-C3, C5-C7 Hypomobile/ painful CPAs C4-C5, T1-T8   CERVICAL ROM:    AROM A/PROM (deg) 10/25/2021 AROM 11/16/2021 AROM 01/03/2022  Flexion 40p! 68   Extension 65p!! 42p! 60, minor pain  Right lateral flexion 45 45   Left lateral flexion 45 45   Right rotation 61p! 81   Left rotation 68p! 81    (Blank rows = not tested)   UE ROM:   AROM Right 10/25/2021 Left 10/25/2021  Shoulder flexion WNL WNL  Shoulder abduction WNL WNL   (Blank rows = not tested)   UE MMT:   MMT Right 10/25/2021 Left 10/25/2021 Right 12/08/2021 Left 12/08/2021 Right 01/03/2022 Left 01/03/2022  Shoulder flexion 5/5 5/5      Shoulder abduction 5/5 5/5      Cervical flexion 4/5p!     Cervical extension 4/5p!     Cervical side bend 5/5 5/5      Shoulder IR 5/5 5/5      Shoulder ER 5/5 5/5      Mid trap 3/5 3/5 4/5 4/5  4+/5 4+/5  Low trap 3/5 3/5 4/5 4/5 4+/5 4+/5  Latissimus dorsi 3/5 3/5 4+/5 4+/5 4+/5 4+/5   (Blank rows = not tested)   CERVICAL SPECIAL TESTS:  Cervical distraction: (-) Cervical compression: (+) Quadrant testing: (+) BIL     FUNCTIONAL TESTS:  DNF endurance test: 15 seconds    01/03/2022: DNF endurance: 35 seconds    01/18/2022: DNF endurance: 58 seconds     TODAY'S TREATMENT:  [facility] Adult PT Treatment:                                                DATE: 02/08/2022 Therapeutic Exercise: Quadruped donkey kicks with 6# ankle weights  2x10 BIL Side knee plank with hip abduction 2x10 BIL with 6# ankle weights Supine bicycles with 6# ankle weights 3x10 Standing Cybex hip abduction with 37.5# 3x10 BIL Standing Cybex hip flexion with 37.5# 3x10 BIL Seated piriformis stretch x49mn BIL Seated forward flexion stretch x173m Single-leg squat with UE support 2x10 BIL Manual Therapy: N/A Neuromuscular re-ed: N/A Therapeutic Activity: N/A Modalities: N/A Self Care: N/A   [facility] Adult PT Treatment:                                                DATE: 02/04/2022 Therapeutic Exercise: Supine mini bicycle kicks with handhold thigh resistance 3x20 Bridge isometric with mini-marching 3x20 Side knee plank with hip abduction 3x10 BIL Mini deadlift with 10# kettlebell with UE support 3x10 BIL Kickstand stance Pallof press into hip ER with 3# cable x10  Kickstand stance Pallof press into hip IR with 7# cable x10  Manual Therapy: N/A Neuromuscular re-ed: N/A Therapeutic Activity: N/A Modalities: N/A Self Care: N/A   [facility] Adult PT Treatment:                                                DATE: 01/25/2022 Therapeutic Exercise: Prone W lift-off with 2# dumbbells 3x10 Birddogs with knee-elbow taps 2x10 BIL Seated cervical extension SNAGS 10x10 seconds Cervical rotation isotonic contractions against handhold resistance with "break" force 2x10 with 5-sec holds BIL Standing incline  press with two 3# cables with chin tuck hold 3x10 Standing low rows with 7# cables 3x10 Standing alternating shoulder extension/ abduction and flexion/ abduction (X pattern pulls) 3x10 with 3# cables Standing arm circle angels 2x6 Standing lt pull-down with two 10# cables 3x10 Manual Therapy: N/A Neuromuscular re-ed: N/A Therapeutic Activity: N/A Modalities: N/A Self Care: N/A      HOME EXERCISE PROGRAM: Access Code: [email or web address]: [email or web address] Date: 10/25/2021 Prepared by: TuVanessa Greentop Exercises - Seated Cervical Retraction  - 1 x daily - 7 x weekly - 3 sets - 10 reps - 3-sec hold - Standing Shoulder Row with Anchored Resistance  - 1 x daily - 7 x weekly - 3 sets - 10 reps - 3-sec hold - Low trap slides at wall with lift-off  - 1 x daily - 7 x weekly - 3 sets - 10 reps - 3-sec hold - Shoulder Extension with Resistance  - 1 x daily - 7 x weekly - 3 sets - 10 reps - 3-sec hold  Added 11/16/2021: - Seated Upper Trapezius Stretch  - 1 x daily - 7 x weekly - 2 sets - 1-min hold - Seated Levator Scapulae Stretch  - 1 x daily - 7 x weekly - 2 sets - 1-min hold  Added 12/03/2021: - Seated Assisted Cervical Rotation with Towel  - 1 x daily - 7 x weekly - 3 sets - 10 reps - Cervical Extension AROM with Strap  - 1 x daily - 7 x weekly - 3 sets - 10 reps   ASSESSMENT:   CLINICAL IMPRESSION: Pt responded well to progressed global hip strengthening, demonstrating good form and no pain throughout the session. She reports that the exercises presented a good challenge without causing excess fatigue. She will continue to benefit from skilled PT  to address her primary impairments and return to her prior level of function with less limitation.      OBJECTIVE IMPAIRMENTS Abnormal gait, decreased balance, decreased mobility, difficulty walking, decreased ROM, decreased strength, hypomobility, increased edema, increased muscle spasms, impaired flexibility, improper  body mechanics, postural dysfunction, and pain.    ACTIVITY LIMITATIONS carrying, lifting, bending, sitting, standing, squatting, sleeping, stairs, transfers, and locomotion level   PARTICIPATION LIMITATIONS: meal prep, cleaning, laundry, interpersonal relationship, driving, shopping, community activity, occupation, and yard work   PERSONAL FACTORS Past/current experiences, Time since onset of injury/illness/exacerbation, and 3+ comorbidities: See medical hx  are also affecting patient's functional outcome.        GOALS: Goals reviewed with patient? Yes   SHORT TERM GOALS: Target date: 11/22/2021  Pt will report understanding and adherence to initial HEP in order to promote independence in the management of primary impairments. Baseline: HEP provided at eval Goal status: MET Pt reports adherence 12/03/21     LONG TERM GOALS: Target date: 12/20/2021    Pt will achieve a cervical FOTO score of 67% in order to demonstrate improved functional ability as it relates to her primary impairments. Baseline: 59% 12/08/2021: 51% 01/18/2022: 60% Goal status: IN PROGRESS   2.  Pt will achieve BIL global parascapular strength of 4+/5 or higher in order to promote improved posture and prophylaxis of future mechanical neck pain Baseline: 3/5 global parascapular strength 12/08/2021: See updated MMT chart 01/03/2022: See updated MMT chart Goal status: ACHIEVED   3.  Pt will achieve global cervical AROM WNL and with 0-2/10 pain in order to get dressed with less limitation. Baseline: See AROM chart 11/16/2021: See updated AROM chart 01/03/2022: See updated AROM (4/10 pain with extension) Goal status: IN PROGRESS   4.  Pt will report ability to complete a full work day, including looking down at her computer screen with 0-3/10 pain in order to complete her work duties with less limitation. Baseline: Pt has >6/10 pain at the end of her work day 01/03/2022: Pt reports average daily pain at work of 5-6/10 Goal  status: IN PROGRESS   5.  *New 01/03/2022* Pt will report ability to sleep through the night without being waken due to pain.   Baseline: Pt woken 5x per night   01/18/2022: Pt continues to be woken 5x per night   Goal status: IN PROGRESS       PLAN: PT FREQUENCY: 1x/week   PT DURATION: 6 weeks   PLANNED INTERVENTIONS: Therapeutic exercises, Therapeutic activity, Neuromuscular re-education, Balance training, Gait training, Patient/Family education, Joint mobilization, Stair training, Aquatic Therapy, Dry Needling, Electrical stimulation, Spinal mobilization, Cryotherapy, Moist heat, Taping, Vasopneumatic device, Traction, Biofeedback, Ionotophoresis 31m/ml Dexamethasone, Manual therapy, and Re-evaluation   PLAN FOR NEXT SESSION: Progress DNF/ parascapular strength/ manual techniques for pain (no OMT due to EDS)   YVanessa  PT, DPT 02/08/22 6:27 PM

## 2022-02-09 ENCOUNTER — Other Ambulatory Visit (HOSPITAL_COMMUNITY)
Admission: RE | Admit: 2022-02-09 | Discharge: 2022-02-09 | Disposition: A | Discharge status: Home / Self Care | Payer: BC Managed Care – PPO | Source: Ambulatory Visit | Attending: Obstetrics and Gynecology | Admitting: Obstetrics and Gynecology

## 2022-02-09 ENCOUNTER — Encounter: Payer: Self-pay | Admitting: Obstetrics and Gynecology

## 2022-02-09 ENCOUNTER — Ambulatory Visit (INDEPENDENT_AMBULATORY_CARE_PROVIDER_SITE_OTHER): Payer: BC Managed Care – PPO | Admitting: Obstetrics and Gynecology

## 2022-02-09 ENCOUNTER — Ambulatory Visit (INDEPENDENT_AMBULATORY_CARE_PROVIDER_SITE_OTHER): Payer: BC Managed Care – PPO | Admitting: Family Medicine

## 2022-02-09 VITALS — BP 112/69 | HR 91 | Ht 64.0 in | Wt 107.0 lb

## 2022-02-09 DIAGNOSIS — K3184 Gastroparesis: Secondary | ICD-10-CM

## 2022-02-09 DIAGNOSIS — Z01419 Encounter for gynecological examination (general) (routine) without abnormal findings: Secondary | ICD-10-CM

## 2022-02-09 DIAGNOSIS — F411 Generalized anxiety disorder: Secondary | ICD-10-CM | POA: Diagnosis not present

## 2022-02-09 DIAGNOSIS — Z3009 Encounter for other general counseling and advice on contraception: Secondary | ICD-10-CM

## 2022-02-09 MED ORDER — SLYND 4 MG PO TABS: 1.0000 | ORAL_TABLET | Freq: Every day | ORAL | 11 refills | Status: DC

## 2022-02-09 MED ORDER — SLYND 4 MG PO TABS: 1.0000 | ORAL_TABLET | Freq: Every day | ORAL | 3 refills | Status: DC

## 2022-02-09 NOTE — Progress Notes (Signed)
Telehealth Encounter  I connected with Dalaysia Harms (MRN [id number]) on 02/09/2022 by MyChart video-enabled, HIPAA-compliant telemedicine application, verified that I was speaking with the correct person using two identifiers, and that the patient was in a private environment conducive to confidentiality.  The patient agreed to proceed. PCP Maryruth Hancock, MD, [facility] Family Medicine Counselor Concha Pyo, MSW, LCSW at [facility] in [place] Eating therapist Lyda Perone, MSW, LCSW at [facility] in [place] DBT therapist (in-person) Missy Alvie [place] Counseling Psychiatrist Alicia Amel, [place] at [facility] in [place]  Persons participating in visit were patient and provider (registered dietitian) [name], PhD, RD, LDN, CEDRD.  Provider was located at [facility] during this telehealth encounter; patient was at home.  Appt start time: 1430 end time: 1530 (1 hour)   Reason for telehealth visit: Referred by Fredia Sorrow, PhD for Medical Nutrition Therapy related to IBS and gastroparesis, as well as food anxiety, and difficulty eating.  She wants to reverse the weight loss related to the above.  She weighed 116 lb in March 2022.  Relevant history/background: Kurstin has a history of disordered eating, which has fluctuated between restriction and bingeing as early as elementary school. GI issues started at around age 3, and got especially bad in 2020. Gastroparesis was diagnosed June 2022, with no identified cause.  Dayannara suspects vagus nerve damage possibly due to COVID she got in fall 2020.  Her only COVID symptoms were GI-related.  In high school, Shaniece self-injured, which she did again in 2021, as she tried to cope with stress.  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   Stephonie will be  seeing GI RD Derry Lory at the [employer] [facility] at [facility] on April 28.  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  Assessment: Genae said her eating has been getting "worse" as she has not been able follow through with eating intentions.  Has been eating out of emotion, especially at night.  She has established with a psychiatrist at [facility], who prescribed lamictal, which was d/c'd after severe intrusive thoughts.  Pain has only increased since hip labrum repair.  MRI last week showed another labrum tear on the same hip for which she has surgery appt on Dec 5,  with expected time out of work of at least 2 months.  This creates financial hardship in addn to emotional stress, particularly as she will need to live with her mom and brother again during rehab.    Continues to have GI pain and inconsistent bowel function, although diarrhea most days.  She has a GI F/U appt in mid-October.  Linzess Rx prescribed >2 months ago required prior authorization, and despite calls to office, she has received no word of authorization.  Ilyse has been working with eating therapist Lyda Perone, MSW, LCSW at [facility], focusing on changing her language around foods in her diet.  Liviya would like to be able to eat intuitively and lessen obsessive food thoughts.    Weight: 109 lb, stable all summer following wt gain at start of summer.  Usual eating pattern: 3 small meals and 2-3 snacks per day. Usual physical activity: None currently. Sleep: Estimates 5 interrupted  hrs per night.  Struggles with racing thoughts and "just can't sleep" as well follow-through with sleep hygiene.    24-hr recall:  (Up at 6:45 AM; 12 oz water) B (8 AM)-  [employer] Egg white bites, 20 oz macha oat milk latte Snk  (11 AM)-  [employer] [place] pro shake* (25 g fat) consumed slowly through day (500 kcal) L (3 PM)-  5 oz f-f yogurt (60 kcal)   D (9 PM)-  2 pcs sushi, ~1 c chips, 1/2 c salsa Snk (9:30PM)- 1 cc cookie Typical day? Yes.   Drinks at least 80 oz water through day.  Has been advised to keep fluid intake up to maintain blood volume, especially in light of chronic diarrhea.   *Recommended by [facility].  Previous estimated kcal intakes: 06/23/21:  1300 03/28/21: 1925 02/21/21:  1880  01/25/21:      ?? 12/28/20:  1160  11/29/20:  2230  11/08/20:  1170  Intervention: Reviewed diet and recent medical history, and confirmed behavioral goals.  Encouraged signing ROI that will allow me to speak to therapist Alease Medina.    For recommendations and goals, see Patient Instructions.    Follow-up: Remote appt in 4 weeks.  Poet Hineman,JEANNIE

## 2022-02-09 NOTE — Patient Instructions (Addendum)
Call both pharmacy and GI office to inquire about Linzess Rx.    You have good knowledge about food and nutrition, but you are frustrated that you make regretted food choices nonetheless.  I'm suggesting a process to analyze those regretted food choices, designed to help you give yourself some grace as well as to help with future self-regulation (= better choices).   Analyzing a regretted food choice: 1. Recognize (name) the regretted food choice.       Then address your behavior with curiosity rather than with judgment, asking yourself:  2. Why did I make that choice; what led to that decision? 3. What can I put in place to help me make a better choice next time (not fall into that trap)? WRITE your answers to the above questions, and commit to the recommendations you come up with.    For example:  1. Regretted food choice:  Dinner of a couple pieces of sushi, chips, salsa, cc cookie 2. Why did I make that choice; what led to that decision?     - It was late, and I was exhausted.     - No good food options on hand.     - Needed something to eat that was quick and easy to fix. 3. What can I put in place to help ensure a better choice next time (not fall into that trap)?     - Better planning, keeping foods on hand.     - Make a list of options for a relatively quick, easy meals, i.e., microwaved potato with egg, small amt frozen vegetable; peach fruit cups, tuna in the pouch     - Place phone somewhere out of sight/reach to avoid getting trapped in wasted, unintentional screentime.     - Make dinner asap following each of the tasks you want to/need to do after work.     - Consider ALL 3 Qs of a Good Food Decision: (1) How hungry am I?; What am I in the mood for?; What's good for me?  (Remember to interpret this last question broadly - what's good for you emotionally as well as physically.  Sign the Release of Information form I am providing today, which will allow me to speak with Sherise at  [facility] (return to me and provide Sherise with a copy).

## 2022-02-12 ENCOUNTER — Encounter: Payer: Self-pay | Admitting: Obstetrics and Gynecology

## 2022-02-12 LAB — CERVICOVAGINAL ANCILLARY ONLY
Chlamydia: NEGATIVE
Comment: NEGATIVE
Comment: NEGATIVE
Comment: NORMAL
Neisseria Gonorrhea: NEGATIVE
Trichomonas: NEGATIVE

## 2022-02-15 ENCOUNTER — Ambulatory Visit: Payer: BC Managed Care – PPO

## 2022-02-15 DIAGNOSIS — M542 Cervicalgia: Secondary | ICD-10-CM | POA: Diagnosis not present

## 2022-02-15 DIAGNOSIS — M6281 Muscle weakness (generalized): Secondary | ICD-10-CM

## 2022-02-15 DIAGNOSIS — R262 Difficulty in walking, not elsewhere classified: Secondary | ICD-10-CM

## 2022-02-15 DIAGNOSIS — M25551 Pain in right hip: Secondary | ICD-10-CM

## 2022-02-15 NOTE — Therapy (Signed)
OUTPATIENT PHYSICAL THERAPY TREATMENT NOTE/ RE-EVALUATION   Patient Name: Brianna [name] MRN: [id number] DOB:22-Apr-1996, 26 y.o., female Today's Date: 02/15/2022  PCP: Josephina Shih, MD REFERRING PROVIDER: Vanetta Mulders, MD  END OF SESSION:   PT End of Session - 02/15/22 1749     Visit Number 14    Number of Visits 22    Date for PT Re-Evaluation 04/19/22    Authorization Type BC[employer]    Authorization Time Period FOTO v6, v10    PT Start Time 1745    PT Stop Time 7416   Pt asks to leave early today to get some work done.   PT Time Calculation (min) 25 min    Activity Tolerance Patient tolerated treatment well    Behavior During Therapy [facility] for tasks assessed/performed                        Past Medical History:  Diagnosis Date   Acute recurrent pansinusitis 10/21/2019   ADHD (attention deficit hyperactivity disorder)    inattentive   Anemia    Anxiety    Asthma    Bronchitis    Cat allergy due to both airborne and skin contact 05/01/2016   Chronic sore throat 05/01/2016   COVID 2020   Depression    Fibromyalgia    Gastroparesis    GERD (gastroesophageal reflux disease)    Hypermobile Ehlers-Danlos syndrome    Irregular heart rate    Keratosis pilaris    Migraine    Seasonal allergic rhinitis 03/11/2014   Past Surgical History:  Procedure Laterality Date   ESOPHAGOGASTRODUODENOSCOPY ENDOSCOPY     03/2020   JOINT REPLACEMENT     LABRAL REPAIR Right 03/02/2021   Procedure: RIGHT HIP ARTHROSCOPY WITH LABRAL REPAIR AND PINCE DEBRIDEMENT;  Surgeon: Vanetta Mulders, MD;  Location: [place];  Service: Orthopedics;  Laterality: Right;   SMART PILL PROCEDURE     12/2020   TYMPANOSTOMY TUBE PLACEMENT Bilateral    WISDOM TOOTH EXTRACTION     Patient Active Problem List   Diagnosis Date Noted   Cervical adenopathy 06/10/2021   Chronic pruritus 06/10/2021   Exercise-induced asthma 06/09/2021   Chronic venous insufficiency 06/03/2021    Orthostatic lightheadedness 06/03/2021   Purple toe syndrome of both feet ([place]) 06/03/2021   Autonomic dysfunction 06/03/2021   Labral tear of hip, degenerative 04/13/2021   Migraine headache 03/01/2021   Headache 11/30/2020   Positive ANA (antinuclear antibody) 11/15/2020   Fibromyalgia 11/02/2020   Hair loss 11/02/2020   Muscle spasticity 10/22/2020   Hypermobile Ehlers-Danlos syndrome 10/22/2020   Paresthesia of bilateral legs 10/06/2020   Myalgia 10/06/2020   Chronic pelvic pain in female 10/06/2020   Chronic abdominal pain 10/06/2020   Irritable colon 08/02/2020   Pelvic floor dysfunction 08/02/2020   Irritant dermatitis 08/02/2020   Gastroesophageal reflux disease 06/28/2020   Severe recurrent major depression without psychotic features ([place]) 06/28/2020   Polyarthralgia 06/28/2020   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 06/28/2020   Tachycardia 06/28/2020   History of COVID-19 06/28/2020   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 deficit 06/28/2020   Recurrent major depressive disorder, in full remission ([place]) 06/28/2020   Atypical chest pain 06/22/2020   Dizziness 06/22/2020   Palpitations 06/14/2020   Anxiety 05/04/2020   Internal and external hemorrhoids without complication 38/45/3646   Nondiabetic gastroparesis 03/03/2020   ETD (Eustachian tube dysfunction), bilateral 10/21/2019   Acne vulgaris 03/11/2014   Mild intermittent asthma without complication 80/32/1224  Seasonal allergies 03/11/2014    REFERRING DIAG: M25.551 (ICD-10-CM) - Pain in right hip  THERAPY DIAG:  Neck pain  Pain in right hip  Muscle weakness (generalized)  Difficulty in walking, not elsewhere classified  Rationale for Evaluation and Treatment Rehabilitation  PERTINENT HISTORY: 03/02/2021: RIGHT HIP ARTHROSCOPY WITH LABRAL REPAIR AND PINCE DEBRIDEMENT, anxiety, depression, fibromyalgia, asthma, Symptomatic Ehler's-Danlos syndrome  PRECAUTIONS: Avoid OMT  due to diagnosis of EDS  SUBJECTIVE: Pt reports she has had a rough day today at work, although her hip pain is not bad today. She also reports 3/10 neck pain.  PAIN:  Are you having pain? Yes: NPRS scale: 4/10 Pain location: Rt posterior neck/ upper back Pain description: Achy, sharp Aggravating factors: BIL cervical rotation, looking down, looking down at computer screen/ phone Relieving factors: I[employer]   OBJECTIVE: (objective measures completed at initial evaluation unless otherwise dated)   DIAGNOSTIC FINDINGS: MR Hip Right with contrast: IMPRESSION: 1. Full-thickness tear of the right anterior superior labrum.   PATIENT SURVEYS:  FOTO Cervical: 59%, predicted 67% in 11 visits; Hip: 58%, predicted 71% in 13 visits 12/08/2021: Cervical: 51%; Hip: 61% 01/18/2022: Cervical: 60%   COGNITION:           Overall cognitive status: Within functional limits for tasks assessed                          SENSATION: Not tested     POSTURE:  Forward head/ BIL rounded shoulders   PALPATION: TTP with palpable trigger points to BIL cervical paraspinals/ UT/ mid trap           PASSIVE ACCESSORIES: Hypermobile/ painful CPAs C2-C3, C5-C7 Hypomobile/ painful CPAs C4-C5, T1-T8   CERVICAL ROM:    AROM A/PROM (deg) 10/25/2021 AROM 11/16/2021 AROM 01/03/2022  Flexion 40p! 68   Extension 65p!! 42p! 60, minor pain  Right lateral flexion 45 45   Left lateral flexion 45 45   Right rotation 61p! 81   Left rotation 68p! 81    (Blank rows = not tested)   UE ROM:   AROM Right 10/25/2021 Left 10/25/2021  Shoulder flexion WNL WNL  Shoulder abduction WNL WNL   (Blank rows = not tested)   UE MMT:   MMT Right 10/25/2021 Left 10/25/2021 Right 12/08/2021 Left 12/08/2021 Right 01/03/2022 Left 01/03/2022  Shoulder flexion 5/5 5/5      Shoulder abduction 5/5 5/5      Cervical flexion 4/5p!     Cervical extension 4/5p!     Cervical side bend 5/5 5/5      Shoulder IR 5/5 5/5      Shoulder ER 5/5 5/5       Mid trap 3/5 3/5 4/5 4/5 4+/5 4+/5  Low trap 3/5 3/5 4/5 4/5 4+/5 4+/5  Latissimus dorsi 3/5 3/5 4+/5 4+/5 4+/5 4+/5   (Blank rows = not tested)   CERVICAL SPECIAL TESTS:  Cervical distraction: (-) Cervical compression: (+) Quadrant testing: (+) BIL     FUNCTIONAL TESTS:  DNF endurance test: 15 seconds    01/03/2022: DNF endurance: 35 seconds    01/18/2022: DNF endurance: 58 seconds  PALPATION: 02/15/2022: TTP to Rt greater trochanter/ anterior labrum  LE ROM:  A/PROM Right 02/15/2022 Left 02/15/2022  Hip flexion 95/110p! 110/140  Hip abduction 45/55p! 40/60  Hip adduction 15/20p! 20/23  Hip internal rotation 20/30p! 30/50  Hip external rotation 30p!/40p!! 30/45   (Blank rows = not tested)  LE MMT:  MMT  Right 02/15/2022 Left 02/15/2022  Hip flexion 4+/5 5/5  Hip extension 4+/5 5/5  Hip abduction 4+/5 5/5  Hip adduction 5/5 5/5  Hip internal rotation 5/5 5/5  Hip external rotation 4+/5p! 5/5  Knee flexion 5/5 5/5  Knee extension 5/5 5/5   (Blank rows = not tested)  FUNCTIONAL TESTS:  5xSTS: 9 seconds Squat: WNL, mild pain Rt lunge: WNL, mild pain; Lt lunge: WNL     TODAY'S TREATMENT:  [facility] Adult PT Treatment:                                                DATE: 02/15/2022 Therapeutic Exercise: Side knee plank with hip abduction x10 BIL Supine bicycles x30 Quadruped donkey kicks x8 BIL SL bridge x10 BIL Manual Therapy: N/A Neuromuscular re-ed: N/A Therapeutic Activity: N/A Modalities: N/A Self Care: N/A   [facility] Adult PT Treatment:                                                DATE: 02/08/2022 Therapeutic Exercise: Quadruped donkey kicks with 6# ankle weights 2x10 BIL Side knee plank with hip abduction 2x10 BIL with 6# ankle weights Supine bicycles with 6# ankle weights 3x10 Standing Cybex hip abduction with 37.5# 3x10 BIL Standing Cybex hip flexion with 37.5# 3x10 BIL Seated piriformis stretch x66mn BIL Seated forward flexion stretch  x163m Single-leg squat with UE support 2x10 BIL Manual Therapy: N/A Neuromuscular re-ed: N/A Therapeutic Activity: N/A Modalities: N/A Self Care: N/A   [facility] Adult PT Treatment:                                                DATE: 02/04/2022 Therapeutic Exercise: Supine mini bicycle kicks with handhold thigh resistance 3x20 Bridge isometric with mini-marching 3x20 Side knee plank with hip abduction 3x10 BIL Mini deadlift with 10# kettlebell with UE support 3x10 BIL Kickstand stance Pallof press into hip ER with 3# cable x10  Kickstand stance Pallof press into hip IR with 7# cable x10  Manual Therapy: N/A Neuromuscular re-ed: N/A Therapeutic Activity: N/A Modalities: N/A Self Care: N/A     HOME EXERCISE PROGRAM: Access Code: [email or web address]: [email or web address] Date: 10/25/2021 Prepared by: TuVanessa Rawlins Exercises - Seated Cervical Retraction  - 1 x daily - 7 x weekly - 3 sets - 10 reps - 3-sec hold - Standing Shoulder Row with Anchored Resistance  - 1 x daily - 7 x weekly - 3 sets - 10 reps - 3-sec hold - Low trap slides at wall with lift-off  - 1 x daily - 7 x weekly - 3 sets - 10 reps - 3-sec hold - Shoulder Extension with Resistance  - 1 x daily - 7 x weekly - 3 sets - 10 reps - 3-sec hold  Added 11/16/2021: - Seated Upper Trapezius Stretch  - 1 x daily - 7 x weekly - 2 sets - 1-min hold - Seated Levator Scapulae Stretch  - 1 x daily - 7 x weekly - 2 sets - 1-min hold  Added 12/03/2021: - Seated Assisted Cervical Rotation with Towel  -  1 x daily - 7 x weekly - 3 sets - 10 reps - Cervical Extension AROM with Strap  - 1 x daily - 7 x weekly - 3 sets - 10 reps  Added 02/15/2022: - Modified Side Plank with Hip Abduction  - 1 x daily - 7 x weekly - 3 sets - 10 reps - Single Leg Bridge  - 1 x daily - 7 x weekly - 3 sets - 10 reps - Single-Leg [place] Deadlift With Kettlebell  - 1 x daily - 7 x weekly - 3 sets - 10 reps - Supine Core Bicycle  - 1  x daily - 7 x weekly - 3 sets - 20 reps - Standard Plank  - 1 x daily - 7 x weekly - 3 sets - 30-60 seconds hold   ASSESSMENT:   CLINICAL IMPRESSION: Pt requests to continue POC with a focus on Rt hip impairments due to upcoming Rt hip labral surgery scheduled for 04/25/2022. Due to this a re-evaluation was performed to assess hip. Upon assessment, her primary impairments include painful and limited global Rt hip ROM, painful and limited Rt hip ER MMT, TTP to Rt hip greater trochanter and anterior labrum, and pain with squat and Rt forward lunge. Pt will benefit from continued skilled PT to address her primary impairments and return to her prior level of function with less limitation.     OBJECTIVE IMPAIRMENTS Abnormal gait, decreased balance, decreased mobility, difficulty walking, decreased ROM, decreased strength, hypomobility, increased edema, increased muscle spasms, impaired flexibility, improper body mechanics, postural dysfunction, and pain.    ACTIVITY LIMITATIONS carrying, lifting, bending, sitting, standing, squatting, sleeping, stairs, transfers, and locomotion level   PARTICIPATION LIMITATIONS: meal prep, cleaning, laundry, interpersonal relationship, driving, shopping, community activity, occupation, and yard work   PERSONAL FACTORS Past/current experiences, Time since onset of injury/illness/exacerbation, and 3+ comorbidities: See medical hx  are also affecting patient's functional outcome.        GOALS: Goals reviewed with patient? Yes   SHORT TERM GOALS: Target date: 11/22/2021  Pt will report understanding and adherence to initial HEP in order to promote independence in the management of primary impairments. Baseline: HEP provided at eval Goal status: MET Pt reports adherence 12/03/21     LONG TERM GOALS: Target date: 04/19/2022    Pt will achieve a cervical FOTO score of 67% in order to demonstrate improved functional ability as it relates to her primary  impairments. Baseline: 59% 12/08/2021: 51% 01/18/2022: 60% Goal status: IN PROGRESS   2.  Pt will achieve BIL global parascapular strength of 4+/5 or higher in order to promote improved posture and prophylaxis of future mechanical neck pain Baseline: 3/5 global parascapular strength 12/08/2021: See updated MMT chart 01/03/2022: See updated MMT chart Goal status: ACHIEVED   3.  Pt will achieve global cervical AROM WNL and with 0-2/10 pain in order to get dressed with less limitation. Baseline: See AROM chart 11/16/2021: See updated AROM chart 01/03/2022: See updated AROM (4/10 pain with extension) Goal status: IN PROGRESS   4.  Pt will report ability to complete a full work day, including looking down at her computer screen with 0-3/10 pain in order to complete her work duties with less limitation. Baseline: Pt has >6/10 pain at the end of her work day 01/03/2022: Pt reports average daily pain at work of 5-6/10 Goal status: IN PROGRESS   5.  *New 01/03/2022* Pt will report ability to sleep through the night without being waken due to  pain.   Baseline: Pt woken 5x per night   01/18/2022: Pt continues to be woken 5x per night   Goal status: IN PROGRESS   6. *New 02/15/2022* Pt will achieve global Rt hip AROM within 10 degrees of Lt with 0-2/10 pain in order to get dressed with less limitation.   Baseline: See AROM chart   Goal Status: INITIAL   7. *New 02/15/2022* Pt will achieve 45# floor lifts x10 with 0-3/10 pain in order to demonstrate improved functional LE strength in pursuit of improved post-operative outcomes.   Baseline: Pt has pain with unweighted squat   Goal Status: INITIAL       PLAN: PT FREQUENCY: 1x/week   PT DURATION: 8 weeks   PLANNED INTERVENTIONS: Therapeutic exercises, Therapeutic activity, Neuromuscular re-education, Balance training, Gait training, Patient/Family education, Joint mobilization, Stair training, Aquatic Therapy, Dry Needling, Electrical stimulation,  Spinal mobilization, Cryotherapy, Moist heat, Taping, Vasopneumatic device, Traction, Biofeedback, Ionotophoresis 3m/ml Dexamethasone, Manual therapy, and Re-evaluation   PLAN FOR NEXT SESSION: Progress hip strengthening/ pain modulation for improved post-op outcomes (no OMT due to EDS/ labral tear)   YVanessa Plymouth PT, DPT 02/15/22 6:17 PM

## 2022-02-16 ENCOUNTER — Ambulatory Visit
Admission: RE | Admit: 2022-02-16 | Discharge: 2022-02-16 | Disposition: A | Discharge status: Home / Self Care | Payer: BC Managed Care – PPO | Source: Ambulatory Visit | Attending: Urgent Care | Admitting: Urgent Care

## 2022-02-16 VITALS — BP 106/66 | HR 75 | Temp 99.1°F | Resp 16

## 2022-02-16 DIAGNOSIS — H7291 Unspecified perforation of tympanic membrane, right ear: Secondary | ICD-10-CM

## 2022-02-16 DIAGNOSIS — B2799 Infectious mononucleosis, unspecified with other complication: Secondary | ICD-10-CM | POA: Diagnosis not present

## 2022-02-16 LAB — POCT MONO SCREEN (KUC): Mono, POC: POSITIVE — AB

## 2022-02-16 LAB — POCT RAPID STREP A (OFFICE): Rapid Strep A Screen: NEGATIVE

## 2022-02-16 NOTE — ED Triage Notes (Signed)
Patient presents to UC for right ear fullness and sore throat since 4 days ago. Treating symptom with ibuprofen with no relief.   Denies fever or ear drainage.

## 2022-02-16 NOTE — ED Provider Notes (Signed)
[facility]    CSN: [id number] Arrival date & time: 02/16/22  1525      History   Chief Complaint Chief Complaint  Patient presents with   Ear Fullness    Sore throat - Entered by patient   Sore Throat    HPI Brianna [name] is a 26 y.o. female.   26 year old female presents today due to concerns of sore throat and ear fullness that started abruptly last evening.  She has a known history of chronic recurrent allergies.  She states that Xyzal and cetirizine do not work for her.  She does take Claritin daily.  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  She states the ear pain on the right became very intense last evening.  She denies any hearing loss, whooshing noises, blood, or drainage from the ear.  She reports chronic headaches that are unchanged in nature.  She also reports chronic fatigue and a decreased immune system.  Patient endorses chronic fatigue which is mildly worse today.  She also reports swollen glands in her neck. Patient did answer the initial suicidal screening questions positive for suicidal ideation, but states that this is a history of such, and has no active thoughts of hurting herself.  She is under the care of a therapist and psychiatrist and takes medication for this.   Ear Fullness  Sore Throat    Past Medical History:  Diagnosis Date   Acute recurrent pansinusitis 10/21/2019   ADHD (attention deficit hyperactivity disorder)    inattentive   Anemia    Anxiety    Asthma    Bronchitis    Cat allergy due to both airborne and skin contact 05/01/2016   Chronic sore throat 05/01/2016   COVID 2020   Depression    Fibromyalgia    Gastroparesis    GERD (gastroesophageal reflux disease)    Hypermobile Ehlers-Danlos syndrome    Irregular heart rate    Keratosis pilaris    Migraine    Seasonal allergic rhinitis 03/11/2014    Patient Active Problem List   Diagnosis Date Noted    Cervical adenopathy 06/10/2021   Chronic pruritus 06/10/2021   Exercise-induced asthma 06/09/2021   Chronic venous insufficiency 06/03/2021   Orthostatic lightheadedness 06/03/2021   Purple toe syndrome of both feet ([place]) 06/03/2021   Autonomic dysfunction 06/03/2021   Labral tear of hip, degenerative 04/13/2021   Migraine headache 03/01/2021   Headache 11/30/2020   Positive ANA (antinuclear antibody) 11/15/2020   Fibromyalgia 11/02/2020   Hair loss 11/02/2020   Muscle spasticity 10/22/2020   Hypermobile Ehlers-Danlos syndrome 10/22/2020   Paresthesia of bilateral legs 10/06/2020   Myalgia 10/06/2020   Chronic pelvic pain in female 10/06/2020   Chronic abdominal pain 10/06/2020   Irritable colon 08/02/2020   Pelvic floor dysfunction 08/02/2020   Irritant dermatitis 08/02/2020   Gastroesophageal reflux disease 06/28/2020   Severe recurrent major depression without psychotic features ([place]) 06/28/2020   Polyarthralgia 06/28/2020   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 06/28/2020   Tachycardia 06/28/2020   History of COVID-19 06/28/2020   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 deficit 06/28/2020   Recurrent major depressive disorder, in full remission ([place]) 06/28/2020   Atypical chest pain 06/22/2020   Dizziness 06/22/2020   Palpitations 06/14/2020   Anxiety 05/04/2020   Internal and external hemorrhoids without complication 08/65/7846   Nondiabetic gastroparesis 03/03/2020   ETD (Eustachian tube dysfunction), bilateral 10/21/2019   Acne vulgaris 03/11/2014  Mild intermittent asthma without complication 03/11/2014   Seasonal allergies 03/11/2014    Past Surgical History:  Procedure Laterality Date   ESOPHAGOGASTRODUODENOSCOPY ENDOSCOPY     03/2020   JOINT REPLACEMENT     LABRAL REPAIR Right 03/02/2021   Procedure: RIGHT HIP ARTHROSCOPY WITH LABRAL REPAIR AND PINCE DEBRIDEMENT;  Surgeon: [name], MD;  Location: [facility] OR;  Service:  Orthopedics;  Laterality: Right;   SMART PILL PROCEDURE     12/2020   TYMPANOSTOMY TUBE PLACEMENT Bilateral    WISDOM TOOTH EXTRACTION      OB History     Gravida  1   Para  0   Term      Preterm  0   AB  1   Living  0      SAB      IAB  1   Ectopic  0   Multiple      Live Births  0            Home Medications    Prior to Admission medications   Medication Sig Start Date End Date Taking? Authorizing Provider  Adapalene 0.3 % gel Apply topically. 12/07/21   [provider]  baclofen (LIORESAL) 10 MG tablet Take 10-20 mg by mouth 2 (two) times daily as needed. 01/18/22   [provider]  clonazePAM (KLONOPIN) 1 MG tablet Take 1 mg by mouth 2 (two) times daily. 01/27/22   [provider]  Drospirenone (SLYND) 4 MG TABS Take 1 tablet by mouth daily. 02/09/22   Milas Hock, MD  finasteride (PROPECIA) 1 MG tablet Take 1 mg by mouth daily. 01/06/22   [provider]  Galcanezumab-gnlm [facility]) 120 MG/ML SOAJ  11/17/21   [provider]  naratriptan (AMERGE) 2.5 MG tablet Take by mouth. 08/05/21   [provider]  Omega-3 Fatty Acids (FISH OIL PO) Take 700 Units by mouth.    [provider]  ondansetron (ZOFRAN ODT) 4 MG disintegrating tablet Take 1 tablet (4 mg total) by mouth every 8 (eight) hours as needed for nausea or vomiting. 11/30/20   Drema Dallas, DO  Pediatric Multiple Vitamins (PEDIATRIC MULTIVITAMIN) chewable tablet Chew 1 tablet by mouth daily.    [provider]  promethazine (PHENERGAN) 25 MG tablet Take 1 tablet (25 mg total) by mouth every 6 (six) hours as needed for nausea or vomiting. 03/08/21   Mannie Stabile, PA-C    Family History Family History  Problem Relation Age of Onset   Diabetes Mother    Polycystic ovary syndrome Mother    GER disease Father    Heart disease Paternal Uncle     Social History Social History   Tobacco Use   Smoking status: Former     Types: E-cigarettes, Cigarettes    Quit date: 2021    Years since quitting: 2.7   Smokeless tobacco: Never  Vaping Use   Vaping Use: Former   Substances: Financial trader  Substance Use Topics   Alcohol use: Not Currently   Drug use: Yes    Types: Marijuana    Comment: last use 7 days ago     Allergies   Clindamycin and Tretinoin   Review of Systems Review of Systems As per HPI  Physical Exam Triage Vital Signs ED Triage Vitals  Enc Vitals Group     BP 02/16/22 1543 106/66     Pulse Rate 02/16/22 1543 75     Resp 02/16/22 1543 16  Temp 02/16/22 1543 99.1 F (37.3 C)     Temp Source 02/16/22 1543 Oral     SpO2 02/16/22 1543 98 %     Weight --      Height --      Head Circumference --      Peak Flow --      Pain Score 02/16/22 1541 7     Pain Loc --      Pain Edu? --      Excl. in GC? --    No data found.  Updated Vital Signs BP 106/66 (BP Location: Left Arm)   Pulse 75   Temp 99.1 F (37.3 C) (Oral)   Resp 16   LMP 01/30/2022 (Exact Date)   SpO2 98%   Visual Acuity Right Eye Distance:   Left Eye Distance:   Bilateral Distance:    Right Eye Near:   Left Eye Near:    Bilateral Near:     Physical Exam Vitals and nursing note reviewed.  Constitutional:      General: She is not in acute distress.    Appearance: She is well-developed and normal weight. She is not ill-appearing, toxic-appearing or diaphoretic.  HENT:     Head: Normocephalic and atraumatic.     Jaw: There is normal jaw occlusion. No trismus.     Salivary Glands: Right salivary gland is not diffusely enlarged or tender. Left salivary gland is not diffusely enlarged or tender.     Right Ear: Ear canal normal. No drainage, swelling or tenderness. A middle ear effusion is present. Tympanic membrane is perforated (small, central perforation without blood). Tympanic membrane is not erythematous.     Left Ear: Tympanic membrane and ear canal normal. No drainage, swelling or  tenderness.  No middle ear effusion. Tympanic membrane is not erythematous.     Nose: No congestion or rhinorrhea.     Right Turbinates: Not enlarged or swollen.     Left Turbinates: Not enlarged or swollen.     Right Sinus: No maxillary sinus tenderness or frontal sinus tenderness.     Left Sinus: No maxillary sinus tenderness or frontal sinus tenderness.     Mouth/Throat:     Lips: Pink.     Mouth: Mucous membranes are moist. No injury or oral lesions.     Tongue: No lesions.     Palate: No mass.     Pharynx: Uvula midline. Posterior oropharyngeal erythema present. No pharyngeal swelling, oropharyngeal exudate or uvula swelling.     Tonsils: No tonsillar exudate.  Eyes:     Conjunctiva/sclera: Conjunctivae normal.     Pupils: Pupils are equal, round, and reactive to light.  Neck:     Thyroid: No thyromegaly.  Cardiovascular:     Rate and Rhythm: Normal rate and regular rhythm.     Heart sounds: Normal heart sounds. No murmur heard.    No friction rub. No gallop.  Pulmonary:     Effort: Pulmonary effort is normal. No respiratory distress.     Breath sounds: Normal breath sounds. No stridor. No wheezing, rhonchi or rales.  Chest:     Chest wall: No tenderness.  Abdominal:     General: There is no distension.     Palpations: Abdomen is soft.     Tenderness: There is no abdominal tenderness. There is no rebound.  Musculoskeletal:     Cervical back: Normal range of motion and neck supple.  Lymphadenopathy:     Cervical: Cervical adenopathy (submandibular, anterior and  posterior cervical lymph chains) present.  Skin:    General: Skin is warm.     Capillary Refill: Capillary refill takes less than 2 seconds.     Coloration: Skin is not jaundiced or pale.     Findings: No erythema.  Neurological:     General: No focal deficit present.     Mental Status: She is alert and oriented to person, place, and time.  Psychiatric:        Mood and Affect: Mood normal.      UC  Treatments / Results  Labs (all labs ordered are listed, but only abnormal results are displayed) Labs Reviewed  POCT MONO SCREEN ([facility]) - Abnormal; Notable for the following components:      Result Value   Mono, POC Positive (*)    All other components within normal limits  POCT RAPID STREP A (OFFICE)    EKG   Radiology No results found.  Procedures Procedures (including critical care time)  Medications Ordered in UC Medications - No data to display  Initial Impression / Assessment and Plan / UC Course  I have reviewed the triage vital signs and the nursing notes.  Pertinent labs & imaging results that were available during my care of the patient were reviewed by me and considered in my medical decision making (see chart for details).     Infectious mono - discussed with pt her URI sx, lymphadenopathy and fatigue are likely secondary to Epstein-Barr virus. Supportive care discussed. Recommended avoid kissing or sharing foods, alternate antipyretics as needed for fever and/or throat pain. TM perforation on R - pt denies any recent blood or changes to hearing. 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 No evidence of infection, although there is an effusion with clear air-fluid levels.  Depression - pt answered yes to several initial screening questions. She assured me today that she has no active thoughts of hurting herself, no active plan and is under the care of specialists. Pt is on medications.    Final Clinical Impressions(s) / UC Diagnoses   Final diagnoses:  Infectious mononucleosis, with other complication, infectious mononucleosis due to unspecified organism  Perforated ear drum, right     Discharge Instructions      Your Monospot is positive.  This indicates your symptoms are related to Epstein-Barr virus.  Antibiotics do not help.  The treatment is supportive, alternating ibuprofen and Tylenol may be beneficial.  This is contagious, so do not kiss, share food  or beverages.  Please avoid contact sports.  You do have a perforation in your right eardrum.  I do not see any active bacterial infections of your ear.  You do have clear fluid behind your ears which indicates allergy symptoms.  Please consider purchasing over-the-counter chlorpheniramine and Flonase which should help with your ear symptoms.      ED Prescriptions   None    PDMP not reviewed this encounter.   Maretta Bees, [place] 02/19/22 [phone]

## 2022-02-16 NOTE — Discharge Instructions (Signed)
Your Monospot is positive.  This indicates your symptoms are related to Epstein-Barr virus.  Antibiotics do not help.  The treatment is supportive, alternating ibuprofen and Tylenol may be beneficial.  This is contagious, so do not kiss, share food or beverages.  Please avoid contact sports.  You do have a perforation in your right eardrum.  I do not see any active bacterial infections of your ear.  You do have clear fluid behind your ears which indicates allergy symptoms.  Please consider purchasing over-the-counter chlorpheniramine and Flonase which should help with your ear symptoms.

## 2022-02-17 DIAGNOSIS — R45851 Suicidal ideations: Secondary | ICD-10-CM | POA: Diagnosis not present

## 2022-02-17 DIAGNOSIS — G4753 Recurrent isolated sleep paralysis: Secondary | ICD-10-CM | POA: Diagnosis not present

## 2022-02-17 DIAGNOSIS — G4709 Other insomnia: Secondary | ICD-10-CM | POA: Diagnosis not present

## 2022-02-17 DIAGNOSIS — K3184 Gastroparesis: Secondary | ICD-10-CM | POA: Diagnosis not present

## 2022-02-17 DIAGNOSIS — G478 Other sleep disorders: Secondary | ICD-10-CM | POA: Diagnosis not present

## 2022-02-20 DIAGNOSIS — B2790 Infectious mononucleosis, unspecified without complication: Secondary | ICD-10-CM | POA: Diagnosis not present

## 2022-02-20 DIAGNOSIS — H9201 Otalgia, right ear: Secondary | ICD-10-CM | POA: Diagnosis not present

## 2022-02-20 DIAGNOSIS — R1011 Right upper quadrant pain: Secondary | ICD-10-CM | POA: Diagnosis not present

## 2022-02-20 DIAGNOSIS — R0602 Shortness of breath: Secondary | ICD-10-CM | POA: Diagnosis not present

## 2022-02-21 DIAGNOSIS — L819 Disorder of pigmentation, unspecified: Secondary | ICD-10-CM | POA: Diagnosis not present

## 2022-02-23 DIAGNOSIS — F411 Generalized anxiety disorder: Secondary | ICD-10-CM | POA: Diagnosis not present

## 2022-02-23 DIAGNOSIS — G4486 Cervicogenic headache: Secondary | ICD-10-CM | POA: Diagnosis not present

## 2022-02-24 DIAGNOSIS — R109 Unspecified abdominal pain: Secondary | ICD-10-CM | POA: Diagnosis not present

## 2022-02-24 NOTE — Therapy (Signed)
OUTPATIENT PHYSICAL THERAPY TREATMENT NOTE   Patient Name: Brianna [name] MRN: [id number] DOB:[birth year], 26 y.o., female Today's Date: 02/25/2022  PCP: Josephina Shih, MD REFERRING PROVIDER: Vanetta Mulders, MD  END OF SESSION:   PT End of Session - 02/25/22 0812     Visit Number 15    Number of Visits 22    Date for PT Re-Evaluation 04/19/22    Authorization Type BCBS    Authorization Time Period FOTO v6, v10    PT Start Time 0815    PT Stop Time 4098    PT Time Calculation (min) 40 min    Activity Tolerance Patient tolerated treatment well    Behavior During Therapy [facility] for tasks assessed/performed              Past Medical History:  Diagnosis Date   Acute recurrent pansinusitis 10/21/2019   ADHD (attention deficit hyperactivity disorder)    inattentive   Anemia    Anxiety    Asthma    Bronchitis    Cat allergy due to both airborne and skin contact 05/01/2016   Chronic sore throat 05/01/2016   COVID 2020   Depression    Fibromyalgia    Gastroparesis    GERD (gastroesophageal reflux disease)    Hypermobile Ehlers-Danlos syndrome    Irregular heart rate    Keratosis pilaris    Migraine    Seasonal allergic rhinitis 03/11/2014   Past Surgical History:  Procedure Laterality Date   ESOPHAGOGASTRODUODENOSCOPY ENDOSCOPY     03/2020   JOINT REPLACEMENT     LABRAL REPAIR Right 03/02/2021   Procedure: RIGHT HIP ARTHROSCOPY WITH LABRAL REPAIR AND PINCE DEBRIDEMENT;  Surgeon: Vanetta Mulders, MD;  Location: [place];  Service: Orthopedics;  Laterality: Right;   SMART PILL PROCEDURE     12/2020   TYMPANOSTOMY TUBE PLACEMENT Bilateral    WISDOM TOOTH EXTRACTION     Patient Active Problem List   Diagnosis Date Noted   Cervical adenopathy 06/10/2021   Chronic pruritus 06/10/2021   Exercise-induced asthma 06/09/2021   Chronic venous insufficiency 06/03/2021   Orthostatic lightheadedness 06/03/2021   Purple toe syndrome of both feet ([place]) 06/03/2021    Autonomic dysfunction 06/03/2021   Labral tear of hip, degenerative 04/13/2021   Migraine headache 03/01/2021   Headache 11/30/2020   Positive ANA (antinuclear antibody) 11/15/2020   Fibromyalgia 11/02/2020   Hair loss 11/02/2020   Muscle spasticity 10/22/2020   Hypermobile Ehlers-Danlos syndrome 10/22/2020   Paresthesia of bilateral legs 10/06/2020   Myalgia 10/06/2020   Chronic pelvic pain in female 10/06/2020   Chronic abdominal pain 10/06/2020   Irritable colon 08/02/2020   Pelvic floor dysfunction 08/02/2020   Irritant dermatitis 08/02/2020   Gastroesophageal reflux disease 06/28/2020   Severe recurrent major depression without psychotic features ([place]) 06/28/2020   Polyarthralgia 06/28/2020   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 06/28/2020   Tachycardia 06/28/2020   History of COVID-19 06/28/2020   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 deficit 06/28/2020   Recurrent major depressive disorder, in full remission ([place]) 06/28/2020   Atypical chest pain 06/22/2020   Dizziness 06/22/2020   Palpitations 06/14/2020   Anxiety 05/04/2020   Internal and external hemorrhoids without complication 11/91/4782   Nondiabetic gastroparesis 03/03/2020   ETD (Eustachian tube dysfunction), bilateral 10/21/2019   Acne vulgaris 03/11/2014   Mild intermittent asthma without complication 95/62/1308   Seasonal allergies 03/11/2014    REFERRING DIAG: M25.551 (ICD-10-CM) - Pain in right hip  THERAPY DIAG:  Pain in right  hip  Neck pain  Muscle weakness (generalized)  Rationale for Evaluation and Treatment Rehabilitation  PERTINENT HISTORY: 03/02/2021: RIGHT HIP ARTHROSCOPY WITH LABRAL REPAIR AND PINCE DEBRIDEMENT, anxiety, depression, fibromyalgia, asthma, Symptomatic Ehler's-Danlos syndrome  PRECAUTIONS: 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 4 injections of occipital nerve block received on Thursday.  PAIN:  Are you having pain? Yes: NPRS scale: 2/10 hip, 3/10 upper back, 2/10 neck Pain location: Rt posterior neck/ upper back (Hip?) Pain description: Achy, sharp Aggravating factors: BIL cervical rotation, looking down, looking down at computer screen/ phone Relieving factors: IcyHot   OBJECTIVE: (objective measures completed at initial evaluation unless otherwise dated)   DIAGNOSTIC FINDINGS: MR Hip Right with contrast: IMPRESSION: 1. Full-thickness tear of the right anterior superior labrum.   PATIENT SURVEYS:  FOTO Cervical: 59%, predicted 67% in 11 visits; Hip: 58%, predicted 71% in 13 visits 12/08/2021: Cervical: 51%; Hip: 61% 01/18/2022: Cervical: 60%   COGNITION:           Overall cognitive status: Within functional limits for tasks assessed                          SENSATION: Not tested     POSTURE:  Forward head/ BIL rounded shoulders   PALPATION: TTP with palpable trigger points to BIL cervical paraspinals/ UT/ mid trap           PASSIVE ACCESSORIES: Hypermobile/ painful CPAs C2-C3, C5-C7 Hypomobile/ painful CPAs C4-C5, T1-T8   CERVICAL ROM:    AROM A/PROM (deg) 10/25/2021 AROM 11/16/2021 AROM 01/03/2022  Flexion 40p! 68   Extension 65p!! 42p! 60, minor pain  Right lateral flexion 45 45   Left lateral flexion 45 45   Right rotation 61p! 81   Left rotation 68p! 81    (Blank rows = not tested)   UE ROM:   AROM Right 10/25/2021 Left 10/25/2021  Shoulder flexion WNL WNL  Shoulder abduction WNL WNL   (Blank rows = not tested)   UE MMT:   MMT Right 10/25/2021 Left 10/25/2021 Right 12/08/2021 Left 12/08/2021 Right 01/03/2022 Left 01/03/2022  Shoulder flexion 5/5 5/5      Shoulder abduction 5/5 5/5      Cervical flexion 4/5p!     Cervical extension 4/5p!     Cervical side bend 5/5 5/5      Shoulder IR 5/5 5/5      Shoulder ER 5/5 5/5      Mid trap 3/5  3/5 4/5 4/5 4+/5 4+/5  Low trap 3/5 3/5 4/5 4/5 4+/5 4+/5  Latissimus dorsi 3/5 3/5 4+/5 4+/5 4+/5 4+/5   (Blank rows = not tested)   CERVICAL SPECIAL TESTS:  Cervical distraction: (-) Cervical compression: (+) Quadrant testing: (+) BIL     FUNCTIONAL TESTS:  DNF endurance test: 15 seconds    01/03/2022: DNF endurance: 35 seconds    01/18/2022: DNF endurance: 58 seconds  PALPATION: 02/15/2022: TTP to Rt greater trochanter/ anterior labrum  LE ROM:  A/PROM Right 02/15/2022 Left 02/15/2022  Hip flexion 95/110p! 110/140  Hip abduction 45/55p! 40/60  Hip adduction 15/20p! 20/23  Hip internal rotation 20/30p! 30/50  Hip external rotation 30p!/40p!! 30/45   (Blank rows = not tested)  LE MMT:  MMT Right 02/15/2022 Left 02/15/2022  Hip flexion 4+/5 5/5  Hip extension 4+/5 5/5  Hip abduction 4+/5 5/5  Hip adduction 5/5 5/5  Hip internal rotation 5/5 5/5  Hip external rotation 4+/5p! 5/5  Knee flexion 5/5 5/5  Knee extension 5/5 5/5   (Blank rows = not tested)  FUNCTIONAL TESTS:  5xSTS: 9 seconds Squat: WNL, mild pain Rt lunge: WNL, mild pain; Lt lunge: WNL     TODAY'S TREATMENT: [facility] Adult PT Treatment:                                                DATE: 02/25/2022 Therapeutic Exercise: Modified side plank with hip abduction 2x10 BIL Single leg bridge 2x10 BIL Single leg RDL 10# KB 2x10 BIL Supine bicycle with hand held resistance 3x30" Quadruped donkey kicks x10 BIL Seated/supine figure 4 piriformis stretch x1' BIL Standing Cybex hip abduction/flexion/extension with 37.5# 2x10 BIL  [facility] Adult PT Treatment:                                                DATE: 02/15/2022 Therapeutic Exercise: Side knee plank with hip abduction x10 BIL Supine bicycles x30 Quadruped donkey kicks x8 BIL SL bridge x10 BIL Manual Therapy: N/A Neuromuscular re-ed: N/A Therapeutic Activity: N/A Modalities: N/A Self Care: N/A   [facility] Adult PT Treatment:                                                 DATE: 02/08/2022 Therapeutic Exercise: Quadruped donkey kicks with 6# ankle weights 2x10 BIL Side knee plank with hip abduction 2x10 BIL with 6# ankle weights Supine bicycles with 6# ankle weights 3x10 Standing Cybex hip abduction with 37.5# 3x10 BIL Standing Cybex hip flexion with 37.5# 3x10 BIL Seated piriformis stretch x78mn BIL Seated forward flexion stretch x142m Single-leg squat with UE support 2x10 BIL Manual Therapy: N/A Neuromuscular re-ed: N/A Therapeutic Activity: N/A Modalities: N/A Self Care: N/A     HOME EXERCISE PROGRAM: Access Code: [email or web address]: [email or web address] Date: 10/25/2021 Prepared by: TuVanessa Lodge Exercises - Seated Cervical Retraction  - 1 x daily - 7 x weekly - 3 sets - 10 reps - 3-sec hold - Standing Shoulder Row with Anchored Resistance  - 1 x daily - 7 x weekly - 3 sets - 10 reps - 3-sec hold - Low trap slides at wall with lift-off  - 1 x daily - 7 x weekly - 3 sets - 10 reps - 3-sec hold - Shoulder Extension with Resistance  - 1 x daily - 7 x weekly - 3 sets - 10 reps - 3-sec hold  Added 11/16/2021: - Seated Upper Trapezius Stretch  - 1 x daily - 7 x weekly - 2 sets - 1-min hold - Seated Levator Scapulae Stretch  - 1 x daily - 7 x weekly - 2 sets - 1-min hold  Added 12/03/2021: - Seated Assisted Cervical Rotation with Towel  - 1 x daily - 7 x weekly - 3 sets - 10 reps - Cervical Extension AROM with Strap  - 1 x daily - 7 x weekly - 3 sets - 10 reps  Added 02/15/2022: - Modified Side  Plank with Hip Abduction  - 1 x daily - 7 x weekly - 3 sets - 10 reps - Single Leg Bridge  - 1 x daily - 7 x weekly - 3 sets - 10 reps - Single-Leg [place] Deadlift With Kettlebell  - 1 x daily - 7 x weekly - 3 sets - 10 reps - Supine Core Bicycle  - 1 x daily - 7 x weekly - 3 sets - 20 reps - Standard Plank  - 1 x daily - 7 x weekly - 3 sets - 30-60 seconds hold   ASSESSMENT:   CLINICAL IMPRESSION: Patient  presents to PT reporting recent diagnosis of mono has left her feeling fatigued recently and hasn't been able to work on her home exercises. Session today focused on proximal hip strengthening and stretching. Patient was able to tolerate all prescribed exercises with no adverse effects. Patient continues to benefit from skilled PT services and should be progressed as able to improve functional independence.      OBJECTIVE IMPAIRMENTS Abnormal gait, decreased balance, decreased mobility, difficulty walking, decreased ROM, decreased strength, hypomobility, increased edema, increased muscle spasms, impaired flexibility, improper body mechanics, postural dysfunction, and pain.    ACTIVITY LIMITATIONS carrying, lifting, bending, sitting, standing, squatting, sleeping, stairs, transfers, and locomotion level   PARTICIPATION LIMITATIONS: meal prep, cleaning, laundry, interpersonal relationship, driving, shopping, community activity, occupation, and yard work   PERSONAL FACTORS Past/current experiences, Time since onset of injury/illness/exacerbation, and 3+ comorbidities: See medical hx  are also affecting patient's functional outcome.        GOALS: Goals reviewed with patient? Yes   SHORT TERM GOALS: Target date: 11/22/2021  Pt will report understanding and adherence to initial HEP in order to promote independence in the management of primary impairments. Baseline: HEP provided at eval Goal status: MET Pt reports adherence 12/03/21     LONG TERM GOALS: Target date: 04/19/2022    Pt will achieve a cervical FOTO score of 67% in order to demonstrate improved functional ability as it relates to her primary impairments. Baseline: 59% 12/08/2021: 51% 01/18/2022: 60% Goal status: IN PROGRESS   2.  Pt will achieve BIL global parascapular strength of 4+/5 or higher in order to promote improved posture and prophylaxis of future mechanical neck pain Baseline: 3/5 global parascapular strength 12/08/2021:  See updated MMT chart 01/03/2022: See updated MMT chart Goal status: ACHIEVED   3.  Pt will achieve global cervical AROM WNL and with 0-2/10 pain in order to get dressed with less limitation. Baseline: See AROM chart 11/16/2021: See updated AROM chart 01/03/2022: See updated AROM (4/10 pain with extension) Goal status: IN PROGRESS   4.  Pt will report ability to complete a full work day, including looking down at her computer screen with 0-3/10 pain in order to complete her work duties with less limitation. Baseline: Pt has >6/10 pain at the end of her work day 01/03/2022: Pt reports average daily pain at work of 5-6/10 Goal status: IN PROGRESS   5.  *New 01/03/2022* Pt will report ability to sleep through the night without being waken due to pain.   Baseline: Pt woken 5x per night   01/18/2022: Pt continues to be woken 5x per night   Goal status: IN PROGRESS   6. *New 02/15/2022* Pt will achieve global Rt hip AROM within 10 degrees of Lt with 0-2/10 pain in order to get dressed with less limitation.   Baseline: See AROM chart   Goal Status: INITIAL  7. *New 02/15/2022* Pt will achieve 45# floor lifts x10 with 0-3/10 pain in order to demonstrate improved functional LE strength in pursuit of improved post-operative outcomes.   Baseline: Pt has pain with unweighted squat   Goal Status: INITIAL       PLAN: PT FREQUENCY: 1x/week   PT DURATION: 8 weeks   PLANNED INTERVENTIONS: Therapeutic exercises, Therapeutic activity, Neuromuscular re-education, Balance training, Gait training, Patient/Family education, Joint mobilization, Stair training, Aquatic Therapy, Dry Needling, Electrical stimulation, Spinal mobilization, Cryotherapy, Moist heat, Taping, Vasopneumatic device, Traction, Biofeedback, Ionotophoresis 3m/ml Dexamethasone, Manual therapy, and Re-evaluation   PLAN FOR NEXT SESSION: Progress hip strengthening/ pain modulation for improved post-op outcomes (no OMT due to EDS/ labral  tear)  SMargarette [place] PTA 02/25/22 8:57 AM

## 2022-02-25 ENCOUNTER — Ambulatory Visit: Payer: BC Managed Care – PPO | Attending: Student in an Organized Health Care Education/Training Program

## 2022-02-25 DIAGNOSIS — M6281 Muscle weakness (generalized): Secondary | ICD-10-CM | POA: Diagnosis not present

## 2022-02-25 DIAGNOSIS — M25551 Pain in right hip: Secondary | ICD-10-CM | POA: Diagnosis not present

## 2022-02-25 DIAGNOSIS — R262 Difficulty in walking, not elsewhere classified: Secondary | ICD-10-CM | POA: Insufficient documentation

## 2022-02-25 DIAGNOSIS — M542 Cervicalgia: Secondary | ICD-10-CM | POA: Insufficient documentation

## 2022-02-26 DIAGNOSIS — F411 Generalized anxiety disorder: Secondary | ICD-10-CM | POA: Diagnosis not present

## 2022-02-26 DIAGNOSIS — G894 Chronic pain syndrome: Secondary | ICD-10-CM | POA: Diagnosis not present

## 2022-02-26 DIAGNOSIS — F5081 Binge eating disorder: Secondary | ICD-10-CM | POA: Diagnosis not present

## 2022-02-26 DIAGNOSIS — F3341 Major depressive disorder, recurrent, in partial remission: Secondary | ICD-10-CM | POA: Diagnosis not present

## 2022-03-01 ENCOUNTER — Ambulatory Visit: Payer: BC Managed Care – PPO

## 2022-03-01 DIAGNOSIS — M6281 Muscle weakness (generalized): Secondary | ICD-10-CM

## 2022-03-01 DIAGNOSIS — R262 Difficulty in walking, not elsewhere classified: Secondary | ICD-10-CM | POA: Diagnosis not present

## 2022-03-01 DIAGNOSIS — M542 Cervicalgia: Secondary | ICD-10-CM | POA: Diagnosis not present

## 2022-03-01 DIAGNOSIS — M25551 Pain in right hip: Secondary | ICD-10-CM | POA: Diagnosis not present

## 2022-03-01 NOTE — Therapy (Signed)
OUTPATIENT PHYSICAL THERAPY TREATMENT NOTE   Patient Name: Brianna [name] MRN: [id number] DOB:07-27-1995, 26 y.o., female Today's Date: 03/01/2022  PCP: Josephina Shih, MD REFERRING PROVIDER: Vanetta Mulders, MD  END OF SESSION:   PT End of Session - 03/01/22 1659     Visit Number 16    Number of Visits 22    Date for PT Re-Evaluation 04/19/22    Authorization Type BCBS    Authorization Time Period FOTO v6, v10    PT Start Time 1701    PT Stop Time 2542    PT Time Calculation (min) 40 min    Activity Tolerance Patient tolerated treatment well    Behavior During Therapy [facility] for tasks assessed/performed               Past Medical History:  Diagnosis Date   Acute recurrent pansinusitis 10/21/2019   ADHD (attention deficit hyperactivity disorder)    inattentive   Anemia    Anxiety    Asthma    Bronchitis    Cat allergy due to both airborne and skin contact 05/01/2016   Chronic sore throat 05/01/2016   COVID 2020   Depression    Fibromyalgia    Gastroparesis    GERD (gastroesophageal reflux disease)    Hypermobile Ehlers-Danlos syndrome    Irregular heart rate    Keratosis pilaris    Migraine    Seasonal allergic rhinitis 03/11/2014   Past Surgical History:  Procedure Laterality Date   ESOPHAGOGASTRODUODENOSCOPY ENDOSCOPY     03/2020   JOINT REPLACEMENT     LABRAL REPAIR Right 03/02/2021   Procedure: RIGHT HIP ARTHROSCOPY WITH LABRAL REPAIR AND PINCE DEBRIDEMENT;  Surgeon: Vanetta Mulders, MD;  Location: [facility];  Service: Orthopedics;  Laterality: Right;   SMART PILL PROCEDURE     12/2020   TYMPANOSTOMY TUBE PLACEMENT Bilateral    WISDOM TOOTH EXTRACTION     Patient Active Problem List   Diagnosis Date Noted   Cervical adenopathy 06/10/2021   Chronic pruritus 06/10/2021   Exercise-induced asthma 06/09/2021   Chronic venous insufficiency 06/03/2021   Orthostatic lightheadedness 06/03/2021   Purple toe syndrome of both feet ([place])  06/03/2021   Autonomic dysfunction 06/03/2021   Labral tear of hip, degenerative 04/13/2021   Migraine headache 03/01/2021   Headache 11/30/2020   Positive ANA (antinuclear antibody) 11/15/2020   Fibromyalgia 11/02/2020   Hair loss 11/02/2020   Muscle spasticity 10/22/2020   Hypermobile Ehlers-Danlos syndrome 10/22/2020   Paresthesia of bilateral legs 10/06/2020   Myalgia 10/06/2020   Chronic pelvic pain in female 10/06/2020   Chronic abdominal pain 10/06/2020   Irritable colon 08/02/2020   Pelvic floor dysfunction 08/02/2020   Irritant dermatitis 08/02/2020   Gastroesophageal reflux disease 06/28/2020   Severe recurrent major depression without psychotic features ([place]) 06/28/2020   Polyarthralgia 06/28/2020   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 06/28/2020   Tachycardia 06/28/2020   History of COVID-19 06/28/2020   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 deficit 06/28/2020   Recurrent major depressive disorder, in full remission ([place]) 06/28/2020   Atypical chest pain 06/22/2020   Dizziness 06/22/2020   Palpitations 06/14/2020   Anxiety 05/04/2020   Internal and external hemorrhoids without complication 70/62/3762   Nondiabetic gastroparesis 03/03/2020   ETD (Eustachian tube dysfunction), bilateral 10/21/2019   Acne vulgaris 03/11/2014   Mild intermittent asthma without complication 83/15/1761   Seasonal allergies 03/11/2014    REFERRING DIAG: M25.551 (ICD-10-CM) - Pain in right hip  THERAPY DIAG:  Pain in  right hip  Neck pain  Muscle weakness (generalized)  Difficulty in walking, not elsewhere classified  Rationale for Evaluation and Treatment Rehabilitation  PERTINENT HISTORY: 03/02/2021: RIGHT HIP ARTHROSCOPY WITH LABRAL REPAIR AND PINCE DEBRIDEMENT, anxiety, depression, fibromyalgia, asthma, Symptomatic Ehler's-Danlos syndrome  PRECAUTIONS: 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 Pt reports continued adherence to her HEP.  PAIN:  Are you having pain? Yes: NPRS scale: 2/10 hip, 3/10 upper back, 2/10 neck Pain location: Rt posterior neck/ upper back (Hip?) Pain description: Achy, sharp Aggravating factors: BIL cervical rotation, looking down, looking down at computer screen/ phone Relieving factors: [employer]   OBJECTIVE: (objective measures completed at initial evaluation unless otherwise dated)   DIAGNOSTIC FINDINGS: MR Hip Right with contrast: IMPRESSION: 1. Full-thickness tear of the right anterior superior labrum.   PATIENT SURVEYS:  FOTO Cervical: 59%, predicted 67% in 11 visits; Hip: 58%, predicted 71% in 13 visits 12/08/2021: Cervical: 51%; Hip: 61% 01/18/2022: Cervical: 60%   COGNITION:           Overall cognitive status: Within functional limits for tasks assessed                          SENSATION: Not tested     POSTURE:  Forward head/ BIL rounded shoulders   PALPATION: TTP with palpable trigger points to BIL cervical paraspinals/ UT/ mid trap           PASSIVE ACCESSORIES: Hypermobile/ painful CPAs C2-C3, C5-C7 Hypomobile/ painful CPAs C4-C5, T1-T8   CERVICAL ROM:    AROM A/PROM (deg) 10/25/2021 AROM 11/16/2021 AROM 01/03/2022  Flexion 40p! 68   Extension 65p!! 42p! 60, minor pain  Right lateral flexion 45 45   Left lateral flexion 45 45   Right rotation 61p! 81   Left rotation 68p! 81    (Blank rows = not tested)   UE ROM:   AROM Right 10/25/2021 Left 10/25/2021  Shoulder flexion WNL WNL  Shoulder abduction WNL WNL   (Blank rows = not tested)   UE MMT:   MMT Right 10/25/2021 Left 10/25/2021 Right 12/08/2021 Left 12/08/2021 Right 01/03/2022 Left 01/03/2022  Shoulder flexion 5/5 5/5      Shoulder abduction 5/5 5/5      Cervical flexion 4/5p!     Cervical extension 4/5p!     Cervical side bend 5/5 5/5      Shoulder IR 5/5 5/5      Shoulder ER 5/5 5/5       Mid trap 3/5 3/5 4/5 4/5 4+/5 4+/5  Low trap 3/5 3/5 4/5 4/5 4+/5 4+/5  Latissimus dorsi 3/5 3/5 4+/5 4+/5 4+/5 4+/5   (Blank rows = not tested)   CERVICAL SPECIAL TESTS:  Cervical distraction: (-) Cervical compression: (+) Quadrant testing: (+) BIL     FUNCTIONAL TESTS:  DNF endurance test: 15 seconds    01/03/2022: DNF endurance: 35 seconds    01/18/2022: DNF endurance: 58 seconds  PALPATION: 02/15/2022: TTP to Rt greater trochanter/ anterior labrum  LE ROM:  A/PROM Right 02/15/2022 Left 02/15/2022  Hip flexion 95/110p! 110/140  Hip abduction 45/55p! 40/60  Hip adduction 15/20p! 20/23  Hip internal rotation 20/30p! 30/50  Hip external rotation 30p!/40p!! 30/45   (Blank rows = not tested)  LE MMT:  MMT Right 02/15/2022 Left 02/15/2022  Hip flexion 4+/5 5/5  Hip extension  4+/5 5/5  Hip abduction 4+/5 5/5  Hip adduction 5/5 5/5  Hip internal rotation 5/5 5/5  Hip external rotation 4+/5p! 5/5  Knee flexion 5/5 5/5  Knee extension 5/5 5/5   (Blank rows = not tested)  FUNCTIONAL TESTS:  5xSTS: 9 seconds Squat: WNL, mild pain Rt lunge: WNL, mild pain; Lt lunge: WNL     TODAY'S TREATMENT:  [facility] Adult PT Treatment:                                                DATE: 03/01/2022 Therapeutic Exercise: Supine bicycles with handhold resistance 3x16 Quadruped donkey kicks with 5# ankle weights 2x10 BIL Side knee plank with hip abduction with 5# ankle weight 2x10 BIL 6-inch step up with knee drive with 51# KB on side of stance leg 3x10 BIL Dead lift 2x8 with 45# barbell, x8 with 55# barbell Standing Cybex hip abduction with 30# 2x10 BIL Standing Cybex hip flexion with 30# 2x10 BIL Manual Therapy: N/A Neuromuscular re-ed: N/A Therapeutic Activity: N/A Modalities: N/A Self Care: N/A   [facility] Adult PT Treatment:                                                DATE: 02/25/2022 Therapeutic Exercise: Modified side plank with hip abduction 2x10 BIL Single leg  bridge 2x10 BIL Single leg RDL 10# KB 2x10 BIL Supine bicycle with hand held resistance 3x30" Quadruped donkey kicks x10 BIL Seated/supine figure 4 piriformis stretch x1' BIL Standing Cybex hip abduction/flexion/extension with 37.5# 2x10 BIL  [facility] Adult PT Treatment:                                                DATE: 02/15/2022 Therapeutic Exercise: Side knee plank with hip abduction x10 BIL Supine bicycles x30 Quadruped donkey kicks x8 BIL SL bridge x10 BIL Manual Therapy: N/A Neuromuscular re-ed: N/A Therapeutic Activity: N/A Modalities: N/A Self Care: N/A      HOME EXERCISE PROGRAM: Access Code: [email or web address] URL: [email or web address] Date: 10/25/2021 Prepared by: Vanessa [place]   Exercises - Seated Cervical Retraction  - 1 x daily - 7 x weekly - 3 sets - 10 reps - 3-sec hold - Standing Shoulder Row with Anchored Resistance  - 1 x daily - 7 x weekly - 3 sets - 10 reps - 3-sec hold - Low trap slides at wall with lift-off  - 1 x daily - 7 x weekly - 3 sets - 10 reps - 3-sec hold - Shoulder Extension with Resistance  - 1 x daily - 7 x weekly - 3 sets - 10 reps - 3-sec hold  Added 11/16/2021: - Seated Upper Trapezius Stretch  - 1 x daily - 7 x weekly - 2 sets - 1-min hold - Seated Levator Scapulae Stretch  - 1 x daily - 7 x weekly - 2 sets - 1-min hold  Added 12/03/2021: - Seated Assisted Cervical Rotation with Towel  - 1 x daily - 7 x weekly - 3 sets - 10 reps - Cervical Extension AROM with Strap  - 1 x daily - 7 x  weekly - 3 sets - 10 reps  Added 02/15/2022: - Modified Side Plank with Hip Abduction  - 1 x daily - 7 x weekly - 3 sets - 10 reps - Single Leg Bridge  - 1 x daily - 7 x weekly - 3 sets - 10 reps - Single-Leg [place] Deadlift With Kettlebell  - 1 x daily - 7 x weekly - 3 sets - 10 reps - Supine Core Bicycle  - 1 x daily - 7 x weekly - 3 sets - 20 reps - Standard Plank  - 1 x daily - 7 x weekly - 3 sets - 30-60 seconds hold   ASSESSMENT:    CLINICAL IMPRESSION: Pt continues to progress well with hip strengthening exercises. She reports no increase in pain and demonstrates good form throughout the session. She will continue to benefit from skilled PT to address her primary impairments and return to her prior level of function with less limitation.      OBJECTIVE IMPAIRMENTS Abnormal gait, decreased balance, decreased mobility, difficulty walking, decreased ROM, decreased strength, hypomobility, increased edema, increased muscle spasms, impaired flexibility, improper body mechanics, postural dysfunction, and pain.    ACTIVITY LIMITATIONS carrying, lifting, bending, sitting, standing, squatting, sleeping, stairs, transfers, and locomotion level   PARTICIPATION LIMITATIONS: meal prep, cleaning, laundry, interpersonal relationship, driving, shopping, community activity, occupation, and yard work   PERSONAL FACTORS Past/current experiences, Time since onset of injury/illness/exacerbation, and 3+ comorbidities: See medical hx  are also affecting patient's functional outcome.        GOALS: Goals reviewed with patient? Yes   SHORT TERM GOALS: Target date: 11/22/2021  Pt will report understanding and adherence to initial HEP in order to promote independence in the management of primary impairments. Baseline: HEP provided at eval Goal status: MET Pt reports adherence 12/03/21     LONG TERM GOALS: Target date: 04/19/2022    Pt will achieve a cervical FOTO score of 67% in order to demonstrate improved functional ability as it relates to her primary impairments. Baseline: 59% 12/08/2021: 51% 01/18/2022: 60% Goal status: IN PROGRESS   2.  Pt will achieve BIL global parascapular strength of 4+/5 or higher in order to promote improved posture and prophylaxis of future mechanical neck pain Baseline: 3/5 global parascapular strength 12/08/2021: See updated MMT chart 01/03/2022: See updated MMT chart Goal status: ACHIEVED   3.  Pt will  achieve global cervical AROM WNL and with 0-2/10 pain in order to get dressed with less limitation. Baseline: See AROM chart 11/16/2021: See updated AROM chart 01/03/2022: See updated AROM (4/10 pain with extension) Goal status: IN PROGRESS   4.  Pt will report ability to complete a full work day, including looking down at her computer screen with 0-3/10 pain in order to complete her work duties with less limitation. Baseline: Pt has >6/10 pain at the end of her work day 01/03/2022: Pt reports average daily pain at work of 5-6/10 Goal status: IN PROGRESS   5.  *New 01/03/2022* Pt will report ability to sleep through the night without being waken due to pain.   Baseline: Pt woken 5x per night   01/18/2022: Pt continues to be woken 5x per night   Goal status: IN PROGRESS   6. *New 02/15/2022* Pt will achieve global Rt hip AROM within 10 degrees of Lt with 0-2/10 pain in order to get dressed with less limitation.   Baseline: See AROM chart   Goal Status: INITIAL   7. *New 02/15/2022* Pt will  achieve 45# floor lifts x10 with 0-3/10 pain in order to demonstrate improved functional LE strength in pursuit of improved post-operative outcomes.   Baseline: Pt has pain with unweighted squat   Goal Status: INITIAL       PLAN: PT FREQUENCY: 1x/week   PT DURATION: 8 weeks   PLANNED INTERVENTIONS: Therapeutic exercises, Therapeutic activity, Neuromuscular re-education, Balance training, Gait training, Patient/Family education, Joint mobilization, Stair training, Aquatic Therapy, Dry Needling, Electrical stimulation, Spinal mobilization, Cryotherapy, Moist heat, Taping, Vasopneumatic device, Traction, Biofeedback, Ionotophoresis 67m/ml Dexamethasone, Manual therapy, and Re-evaluation   PLAN FOR NEXT SESSION: Progress hip strengthening/ pain modulation for improved post-op outcomes (no OMT due to EDS/ labral tear)  YVanessa Beemer PT, DPT 03/01/22 5:41 PM

## 2022-03-02 DIAGNOSIS — F411 Generalized anxiety disorder: Secondary | ICD-10-CM | POA: Diagnosis not present

## 2022-03-03 ENCOUNTER — Encounter: Payer: Self-pay | Admitting: Obstetrics and Gynecology

## 2022-03-03 DIAGNOSIS — N921 Excessive and frequent menstruation with irregular cycle: Secondary | ICD-10-CM

## 2022-03-03 DIAGNOSIS — L700 Acne vulgaris: Secondary | ICD-10-CM | POA: Diagnosis not present

## 2022-03-06 ENCOUNTER — Ambulatory Visit (INDEPENDENT_AMBULATORY_CARE_PROVIDER_SITE_OTHER): Payer: BC Managed Care – PPO | Admitting: Family Medicine

## 2022-03-06 DIAGNOSIS — K3184 Gastroparesis: Secondary | ICD-10-CM | POA: Diagnosis not present

## 2022-03-06 NOTE — Progress Notes (Unsigned)
Telehealth Encounter  I connected with Brianna [name] (MRN [id number]) on 03/06/2022 by telephone (could not access video), verified that I was speaking with the correct person using two identifiers, and that the patient was in a private environment conducive to confidentiality.  The patient agreed to proceed. PCP Otho Bellows, MD, [facility] Eating therapist Alease Medina, MSW, LCSW at [facility] in [place] therapist (in-person) [facility] Danne [place], [place] at [facility] in [place] participating in visit were patient and provider (registered dietitian) Kennith Center, PhD, RD, LDN, CEDRD.  Provider was located at [place] during this telehealth encounter; patient was at home.  Appt start time: 1400 end time: 1500 (1 hour)   Reason for telehealth visit: Referred by Kerin Salen, PhD for Medical Nutrition Therapy related to IBS and gastroparesis, as well as food anxiety, and difficulty eating.  She wants to reverse the weight loss related to the above.  She weighed 116 lb in March 2022.  Relevant history/background: Brianna [name] has a history of disordered eating, which has fluctuated between restriction and bingeing as early as elementary school. GI issues started at around age 67, and got especially bad in 2020. Gastroparesis was diagnosed June 2022, with no identified cause.  Brianna [name] suspects vagus nerve damage possibly due to COVID she got in fall 2020.  Her only COVID symptoms were GI-related.  In high school, Brianna [name] self-injured, which she did again in 2021, as she tried to cope with stress.  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    Assessment: Brianna [name] was diagnosed with infectious mononucleosis on 02/16/22.  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  GI distress has improved somewhat, although often constipated now, and still has frequent pain.  She has a GI F/U appt on October 24. Brianna [name] continues eating at bedtime most nights, with a sense of loss-of-control.   Zaylin is starting Invisalign orthodontic treatment at the end of the month, which may impact eating.  A strict eating schedule will be helpful for this reason; Invisalign has to be in place 20 hrs/day.    Weight: 107 lb on 10/2 at [facility] appt.  No check today, as appt was remote.    Usual eating pattern: 3 small meals and 2-3 snacks per day. Usual physical activity: Only PT exercises several times a week. Sleep: Still poor; estimates 5 interrupted hrs per night.  24-hr recall:  (Up at 8 AM) B (10:45 AM)-  1 vanilla scone, orange marmalade, whipped cream, 1 crab cake, wilted greens, 1 poached egg, 1/2 c home-fries, water, hot tea Snk ( AM)-  water L ( PM)-  --- Snk ( PM)-  water D (6:30 PM)-  1 c veg-fried rice, 3 shrimp, water Snk (9 PM)-  1 c chips, salsa, 2 Oreos, 1 handful Gummies, water Typical day? No.  Met friend for brunch.  More typical: protein shake for bkfst, lunch of leftovers or sandwich or soup, granola bar, yogurt, dinner of protein, starch, veg.    Previous estimated kcal intakes: 06/23/21:  1300 03/28/21: 1925 02/21/21:  1880  01/25/21:      ?? 12/28/20:  1160  11/29/20:  2230  11/08/20:  1170  Intervention: Reviewed diet and recent medical history, and confirmed behavioral goals.  Encouraged signing ROI that will allow me to speak to therapist Alease Medina.    For recommendations and goals, see Patient Instructions.    Follow-up: Remote appt TBD.  Yilia Sacca,JEANNIE

## 2022-03-06 NOTE — Patient Instructions (Addendum)
A consistent eating schedule will be especially helpful once you start Invisalign treatment:   Meal No later than Bkfst    7:15 Snack  10:00 Lunch 12:00 Snack   3:00 Dinner   6:30 PM Snack   7:30 - optional   - Make a list of foods you want to have on hand at your mom's when you are there post-surgery.   - Give some thought to actions you can take to generate the mood that's less likely to be associated with emotional eating.  Pay attention to what led up to an evening when you didn't eat emotionally.   - Therapy: If it fits, talk with Mikeal Hawthorne about this process of self-acceptance, especially with respect to self-judgment for food choices, as well as the process below re. analyzing regretted food choices.    The following process is designed to help you give yourself some grace as well as to help with future self-regulation (= better choices).   Analyzing a regretted food choice: 1. Recognize (name) the regretted food choice.       Then address your behavior with curiosity rather than with judgment, asking yourself:  2. Why did I make that choice; what led to that decision? 3. What can I put in place to help me make a better choice next time (not fall into that trap)? WRITE your answers to the above questions, and commit to the recommendations you come up with.     For example:  1. Regretted food choice:  Dinner of a couple pieces of sushi, chips, salsa, cc cookie 2. Why did I make that choice; what led to that decision?     - It was late, and I was exhausted.     - No good food options on hand.     - Needed something to eat that was quick and easy to fix. 3. What can I put in place to help ensure a better choice next time (not fall into that trap)?     - Better planning, keeping foods on hand.     - Make a list of options for a relatively quick, easy meals, i.e., microwaved potato with egg, small amt frozen vegetable; peach fruit cups, tuna in the pouch     - Place phone somewhere out  of sight/reach to avoid getting trapped in wasted, unintentional screentime.     - Make dinner asap following each of the tasks you want to/need to do after work.     - Consider ALL 3 Qs of a Good Food Decision: (1) How hungry am I?; What am I in the mood for?; What's good for me?  (Remember to interpret this last question broadly - what's good for you emotionally as well as physically.   Call to schedule a follow-up appt:  [phone].

## 2022-03-07 DIAGNOSIS — G894 Chronic pain syndrome: Secondary | ICD-10-CM | POA: Diagnosis not present

## 2022-03-07 DIAGNOSIS — Q796 Ehlers-Danlos syndrome, unspecified: Secondary | ICD-10-CM | POA: Diagnosis not present

## 2022-03-07 DIAGNOSIS — F5081 Binge eating disorder: Secondary | ICD-10-CM | POA: Diagnosis not present

## 2022-03-07 DIAGNOSIS — F3341 Major depressive disorder, recurrent, in partial remission: Secondary | ICD-10-CM | POA: Diagnosis not present

## 2022-03-07 DIAGNOSIS — R45851 Suicidal ideations: Secondary | ICD-10-CM | POA: Diagnosis not present

## 2022-03-07 DIAGNOSIS — F411 Generalized anxiety disorder: Secondary | ICD-10-CM | POA: Diagnosis not present

## 2022-03-09 DIAGNOSIS — R768 Other specified abnormal immunological findings in serum: Secondary | ICD-10-CM | POA: Diagnosis not present

## 2022-03-09 DIAGNOSIS — B2790 Infectious mononucleosis, unspecified without complication: Secondary | ICD-10-CM | POA: Diagnosis not present

## 2022-03-09 DIAGNOSIS — J4521 Mild intermittent asthma with (acute) exacerbation: Secondary | ICD-10-CM | POA: Diagnosis not present

## 2022-03-09 DIAGNOSIS — F411 Generalized anxiety disorder: Secondary | ICD-10-CM | POA: Diagnosis not present

## 2022-03-10 ENCOUNTER — Telehealth: Payer: Self-pay | Admitting: Orthopaedic Surgery

## 2022-03-10 NOTE — Telephone Encounter (Signed)
Pt called requesting a call back concerning her short term form that need to be revised with time off work after surgery. Pt phone number is [phone].

## 2022-03-11 ENCOUNTER — Ambulatory Visit: Payer: BC Managed Care – PPO

## 2022-03-11 DIAGNOSIS — R262 Difficulty in walking, not elsewhere classified: Secondary | ICD-10-CM

## 2022-03-11 DIAGNOSIS — M6281 Muscle weakness (generalized): Secondary | ICD-10-CM

## 2022-03-11 DIAGNOSIS — M542 Cervicalgia: Secondary | ICD-10-CM | POA: Diagnosis not present

## 2022-03-11 DIAGNOSIS — M25551 Pain in right hip: Secondary | ICD-10-CM

## 2022-03-11 NOTE — Therapy (Signed)
OUTPATIENT PHYSICAL THERAPY TREATMENT NOTE   Patient Name: Brianna [name] MRN: [id number] DOB:19-Jan-1996, 26 y.o., female Today's Date: 03/11/2022  PCP: Josephina Shih, MD REFERRING PROVIDER: Vanetta Mulders, MD  END OF SESSION:   PT End of Session - 03/11/22 0905     Visit Number 17    Number of Visits 22    Date for PT Re-Evaluation 04/19/22    Authorization Type BCBS    Authorization Time Period FOTO v6, v10    PT Start Time 2105    PT Stop Time 2145    PT Time Calculation (min) 40 min    Activity Tolerance Patient tolerated treatment well    Behavior During Therapy [facility] for tasks assessed/performed                Past Medical History:  Diagnosis Date   Acute recurrent pansinusitis 10/21/2019   ADHD (attention deficit hyperactivity disorder)    inattentive   Anemia    Anxiety    Asthma    Bronchitis    Cat allergy due to both airborne and skin contact 05/01/2016   Chronic sore throat 05/01/2016   COVID 2020   Depression    Fibromyalgia    Gastroparesis    GERD (gastroesophageal reflux disease)    Hypermobile Ehlers-Danlos syndrome    Irregular heart rate    Keratosis pilaris    Migraine    Seasonal allergic rhinitis 03/11/2014   Past Surgical History:  Procedure Laterality Date   ESOPHAGOGASTRODUODENOSCOPY ENDOSCOPY     03/2020   JOINT REPLACEMENT     LABRAL REPAIR Right 03/02/2021   Procedure: RIGHT HIP ARTHROSCOPY WITH LABRAL REPAIR AND PINCE DEBRIDEMENT;  Surgeon: Vanetta Mulders, MD;  Location: [place];  Service: Orthopedics;  Laterality: Right;   SMART PILL PROCEDURE     12/2020   TYMPANOSTOMY TUBE PLACEMENT Bilateral    WISDOM TOOTH EXTRACTION     Patient Active Problem List   Diagnosis Date Noted   Cervical adenopathy 06/10/2021   Chronic pruritus 06/10/2021   Exercise-induced asthma 06/09/2021   Chronic venous insufficiency 06/03/2021   Orthostatic lightheadedness 06/03/2021   Purple toe syndrome of both feet ([place])  06/03/2021   Autonomic dysfunction 06/03/2021   Labral tear of hip, degenerative 04/13/2021   Migraine headache 03/01/2021   Headache 11/30/2020   Positive ANA (antinuclear antibody) 11/15/2020   Fibromyalgia 11/02/2020   Hair loss 11/02/2020   Muscle spasticity 10/22/2020   Hypermobile Ehlers-Danlos syndrome 10/22/2020   Paresthesia of bilateral legs 10/06/2020   Myalgia 10/06/2020   Chronic pelvic pain in female 10/06/2020   Chronic abdominal pain 10/06/2020   Irritable colon 08/02/2020   Pelvic floor dysfunction 08/02/2020   Irritant dermatitis 08/02/2020   Gastroesophageal reflux disease 06/28/2020   Severe recurrent major depression without psychotic features ([place]) 06/28/2020   Polyarthralgia 06/28/2020   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 06/28/2020   Tachycardia 06/28/2020   History of COVID-19 06/28/2020   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 deficit 06/28/2020   Recurrent major depressive disorder, in full remission ([place]) 06/28/2020   Atypical chest pain 06/22/2020   Dizziness 06/22/2020   Palpitations 06/14/2020   Anxiety 05/04/2020   Internal and external hemorrhoids without complication 02/72/5366   Nondiabetic gastroparesis 03/03/2020   ETD (Eustachian tube dysfunction), bilateral 10/21/2019   Acne vulgaris 03/11/2014   Mild intermittent asthma without complication 44/07/4740   Seasonal allergies 03/11/2014    REFERRING DIAG: M25.551 (ICD-10-CM) - Pain in right hip  THERAPY DIAG:  Pain  in right hip  Neck pain  Muscle weakness (generalized)  Difficulty in walking, not elsewhere classified  Rationale for Evaluation and Treatment Rehabilitation  PERTINENT HISTORY: 03/02/2021: RIGHT HIP ARTHROSCOPY WITH LABRAL REPAIR AND PINCE DEBRIDEMENT, anxiety, depression, fibromyalgia, asthma, Symptomatic Ehler's-Danlos syndrome  PRECAUTIONS: 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 She reports 5-lb weight-loss over the past few weeks, despite working with her nutritionist and PCP concerning this problem.   PAIN:  Are you having pain? Yes: NPRS scale: 3/10 Rt hip, 3/10 upper back, 2/10 neck Pain location: Rt posterior neck/ upper back (Hip?) Pain description: Achy, sharp Aggravating factors: BIL cervical rotation, looking down, looking down at computer screen/ phone Relieving factors: [employer]   OBJECTIVE: (objective measures completed at initial evaluation unless otherwise dated)   DIAGNOSTIC FINDINGS: MR Hip Right with contrast: IMPRESSION: 1. Full-thickness tear of the right anterior superior labrum.   PATIENT SURVEYS:  FOTO Cervical: 59%, predicted 67% in 11 visits; Hip: 58%, predicted 71% in 13 visits 12/08/2021: Cervical: 51%; Hip: 61% 01/18/2022: Cervical: 60%   COGNITION:           Overall cognitive status: Within functional limits for tasks assessed                          SENSATION: Not tested     POSTURE:  Forward head/ BIL rounded shoulders   PALPATION: TTP with palpable trigger points to BIL cervical paraspinals/ UT/ mid trap           PASSIVE ACCESSORIES: Hypermobile/ painful CPAs C2-C3, C5-C7 Hypomobile/ painful CPAs C4-C5, T1-T8   CERVICAL ROM:    AROM A/PROM (deg) 10/25/2021 AROM 11/16/2021 AROM 01/03/2022  Flexion 40p! 68   Extension 65p!! 42p! 60, minor pain  Right lateral flexion 45 45   Left lateral flexion 45 45   Right rotation 61p! 81   Left rotation 68p! 81    (Blank rows = not tested)   UE ROM:   AROM Right 10/25/2021 Left 10/25/2021  Shoulder flexion WNL WNL  Shoulder abduction WNL WNL   (Blank rows = not tested)   UE MMT:   MMT Right 10/25/2021 Left 10/25/2021 Right 12/08/2021 Left 12/08/2021 Right 01/03/2022 Left 01/03/2022  Shoulder flexion 5/5 5/5      Shoulder abduction 5/5 5/5      Cervical flexion 4/5p!     Cervical extension 4/5p!     Cervical side bend 5/5 5/5      Shoulder  IR 5/5 5/5      Shoulder ER 5/5 5/5      Mid trap 3/5 3/5 4/5 4/5 4+/5 4+/5  Low trap 3/5 3/5 4/5 4/5 4+/5 4+/5  Latissimus dorsi 3/5 3/5 4+/5 4+/5 4+/5 4+/5   (Blank rows = not tested)   CERVICAL SPECIAL TESTS:  Cervical distraction: (-) Cervical compression: (+) Quadrant testing: (+) BIL     FUNCTIONAL TESTS:  DNF endurance test: 15 seconds    01/03/2022: DNF endurance: 35 seconds    01/18/2022: DNF endurance: 58 seconds  PALPATION: 02/15/2022: TTP to Rt greater trochanter/ anterior labrum  LE ROM:  A/PROM Right 02/15/2022 Left 02/15/2022  Hip flexion 95/110p! 110/140  Hip abduction 45/55p! 40/60  Hip adduction 15/20p! 20/23  Hip internal rotation 20/30p! 30/50  Hip external rotation 30p!/40p!! 30/45   (Blank rows = not tested)  LE MMT:  MMT Right 02/15/2022 Left 02/15/2022  Hip flexion  4+/5 5/5  Hip extension 4+/5 5/5  Hip abduction 4+/5 5/5  Hip adduction 5/5 5/5  Hip internal rotation 5/5 5/5  Hip external rotation 4+/5p! 5/5  Knee flexion 5/5 5/5  Knee extension 5/5 5/5   (Blank rows = not tested)  FUNCTIONAL TESTS:  5xSTS: 9 seconds Squat: WNL, mild pain Rt lunge: WNL, mild pain; Lt lunge: WNL     TODAY'S TREATMENT:  [facility] Adult PT Treatment:                                                DATE: 03/11/2022 Therapeutic Exercise: Tall-kneeling hip thrust on Airex pad with 20# cable to waist attachment 3x10 Standing hip abduction with 7# cable with pull into cross-body adduction 2x10 BIL Romanian deadlift x8 with 25# kettlebell, 2x8 with 15# kettlebell Standing windmill stretch 2x10 Mini-lunge push/pull with 7# cables 2x10 BIL Manual Therapy: N/A Neuromuscular re-ed: N/A Therapeutic Activity: N/A Modalities: N/A Self Care: N/A   [facility] Adult PT Treatment:                                                DATE: 03/01/2022 Therapeutic Exercise: Supine bicycles with handhold resistance 3x16 Quadruped donkey kicks with 5# ankle weights 2x10 BIL Side  knee plank with hip abduction with 5# ankle weight 2x10 BIL 6-inch step up with knee drive with 74# KB on side of stance leg 3x10 BIL Dead lift 2x8 with 45# barbell, x8 with 55# barbell Standing Cybex hip abduction with 30# 2x10 BIL Standing Cybex hip flexion with 30# 2x10 BIL Manual Therapy: N/A Neuromuscular re-ed: N/A Therapeutic Activity: N/A Modalities: N/A Self Care: N/A   [facility] Adult PT Treatment:                                                DATE: 02/25/2022 Therapeutic Exercise: Modified side plank with hip abduction 2x10 BIL Single leg bridge 2x10 BIL Single leg RDL 10# KB 2x10 BIL Supine bicycle with hand held resistance 3x30" Quadruped donkey kicks x10 BIL Seated/supine figure 4 piriformis stretch x1' BIL Standing Cybex hip abduction/flexion/extension with 37.5# 2x10 BIL      HOME EXERCISE PROGRAM: Access Code: [email or web address] URL: [email or web address] Date: 10/25/2021 Prepared by: Vanessa Gackle   Exercises - Seated Cervical Retraction  - 1 x daily - 7 x weekly - 3 sets - 10 reps - 3-sec hold - Standing Shoulder Row with Anchored Resistance  - 1 x daily - 7 x weekly - 3 sets - 10 reps - 3-sec hold - Low trap slides at wall with lift-off  - 1 x daily - 7 x weekly - 3 sets - 10 reps - 3-sec hold - Shoulder Extension with Resistance  - 1 x daily - 7 x weekly - 3 sets - 10 reps - 3-sec hold  Added 11/16/2021: - Seated Upper Trapezius Stretch  - 1 x daily - 7 x weekly - 2 sets - 1-min hold - Seated Levator Scapulae Stretch  - 1 x daily - 7 x weekly - 2 sets - 1-min hold  Added 12/03/2021: - Seated Assisted  Cervical Rotation with Towel  - 1 x daily - 7 x weekly - 3 sets - 10 reps - Cervical Extension AROM with Strap  - 1 x daily - 7 x weekly - 3 sets - 10 reps  Added 02/15/2022: - Modified Side Plank with Hip Abduction  - 1 x daily - 7 x weekly - 3 sets - 10 reps - Single Leg Bridge  - 1 x daily - 7 x weekly - 3 sets - 10 reps - Single-Leg [place]  Deadlift With Kettlebell  - 1 x daily - 7 x weekly - 3 sets - 10 reps - Supine Core Bicycle  - 1 x daily - 7 x weekly - 3 sets - 20 reps - Standard Plank  - 1 x daily - 7 x weekly - 3 sets - 30-60 seconds hold   ASSESSMENT:   CLINICAL IMPRESSION: Pt continues to progress well with new and progressed exercises. She reports a therapeutic response to new hip and LE strengthening today, demonstrating good form and improved pain at the end of the session. She will continue to benefit from skilled PT to address her primary impairments and return to her prior level of function with less limitation.     OBJECTIVE IMPAIRMENTS Abnormal gait, decreased balance, decreased mobility, difficulty walking, decreased ROM, decreased strength, hypomobility, increased edema, increased muscle spasms, impaired flexibility, improper body mechanics, postural dysfunction, and pain.    ACTIVITY LIMITATIONS carrying, lifting, bending, sitting, standing, squatting, sleeping, stairs, transfers, and locomotion level   PARTICIPATION LIMITATIONS: meal prep, cleaning, laundry, interpersonal relationship, driving, shopping, community activity, occupation, and yard work   PERSONAL FACTORS Past/current experiences, Time since onset of injury/illness/exacerbation, and 3+ comorbidities: See medical hx  are also affecting patient's functional outcome.        GOALS: Goals reviewed with patient? Yes   SHORT TERM GOALS: Target date: 11/22/2021  Pt will report understanding and adherence to initial HEP in order to promote independence in the management of primary impairments. Baseline: HEP provided at eval Goal status: MET Pt reports adherence 12/03/21     LONG TERM GOALS: Target date: 04/19/2022    Pt will achieve a cervical FOTO score of 67% in order to demonstrate improved functional ability as it relates to her primary impairments. Baseline: 59% 12/08/2021: 51% 01/18/2022: 60% Goal status: IN PROGRESS   2.  Pt will achieve  BIL global parascapular strength of 4+/5 or higher in order to promote improved posture and prophylaxis of future mechanical neck pain Baseline: 3/5 global parascapular strength 12/08/2021: See updated MMT chart 01/03/2022: See updated MMT chart Goal status: ACHIEVED   3.  Pt will achieve global cervical AROM WNL and with 0-2/10 pain in order to get dressed with less limitation. Baseline: See AROM chart 11/16/2021: See updated AROM chart 01/03/2022: See updated AROM (4/10 pain with extension) Goal status: IN PROGRESS   4.  Pt will report ability to complete a full work day, including looking down at her computer screen with 0-3/10 pain in order to complete her work duties with less limitation. Baseline: Pt has >6/10 pain at the end of her work day 01/03/2022: Pt reports average daily pain at work of 5-6/10 Goal status: IN PROGRESS   5.  *New 01/03/2022* Pt will report ability to sleep through the night without being waken due to pain.   Baseline: Pt woken 5x per night   01/18/2022: Pt continues to be woken 5x per night   Goal status: IN PROGRESS  6. *New 02/15/2022* Pt will achieve global Rt hip AROM within 10 degrees of Lt with 0-2/10 pain in order to get dressed with less limitation.   Baseline: See AROM chart   Goal Status: INITIAL   7. *New 02/15/2022* Pt will achieve 45# floor lifts x10 with 0-3/10 pain in order to demonstrate improved functional LE strength in pursuit of improved post-operative outcomes.   Baseline: Pt has pain with unweighted squat   Goal Status: INITIAL       PLAN: PT FREQUENCY: 1x/week   PT DURATION: 8 weeks   PLANNED INTERVENTIONS: Therapeutic exercises, Therapeutic activity, Neuromuscular re-education, Balance training, Gait training, Patient/Family education, Joint mobilization, Stair training, Aquatic Therapy, Dry Needling, Electrical stimulation, Spinal mobilization, Cryotherapy, Moist heat, Taping, Vasopneumatic device, Traction, Biofeedback,  Ionotophoresis 72m/ml Dexamethasone, Manual therapy, and Re-evaluation   PLAN FOR NEXT SESSION: Progress hip strengthening/ pain modulation for improved post-op outcomes (no OMT due to EDS/ labral tear)  YVanessa Seligman PT, DPT 03/11/22 9:45 AM

## 2022-03-12 DIAGNOSIS — G894 Chronic pain syndrome: Secondary | ICD-10-CM | POA: Diagnosis not present

## 2022-03-12 DIAGNOSIS — F5081 Binge eating disorder: Secondary | ICD-10-CM | POA: Diagnosis not present

## 2022-03-12 DIAGNOSIS — F411 Generalized anxiety disorder: Secondary | ICD-10-CM | POA: Diagnosis not present

## 2022-03-12 DIAGNOSIS — F3341 Major depressive disorder, recurrent, in partial remission: Secondary | ICD-10-CM | POA: Diagnosis not present

## 2022-03-13 ENCOUNTER — Encounter
Payer: BC Managed Care – PPO | Attending: Physical Medicine and Rehabilitation | Admitting: Physical Medicine and Rehabilitation

## 2022-03-13 ENCOUNTER — Encounter: Payer: Self-pay | Admitting: Physical Medicine and Rehabilitation

## 2022-03-13 ENCOUNTER — Telehealth: Payer: Self-pay | Admitting: Orthopaedic Surgery

## 2022-03-13 VITALS — BP 126/88 | HR 88 | Ht 64.0 in | Wt 105.6 lb

## 2022-03-13 DIAGNOSIS — M7918 Myalgia, other site: Secondary | ICD-10-CM | POA: Insufficient documentation

## 2022-03-13 MED ORDER — LIDOCAINE HCL 1 % IJ SOLN
3.0000 mL | Freq: Once | INTRAMUSCULAR | Status: AC
  Administered 2022-03-13: 3 mL

## 2022-03-13 MED ORDER — SODIUM CHLORIDE (PF) 0.9 % IJ SOLN: 2.0000 mL | INTRAMUSCULAR | Status: DC | PRN

## 2022-03-13 MED ORDER — SODIUM CHLORIDE (PF) 0.9 % IJ SOLN
2.0000 mL | Freq: Once | INTRAMUSCULAR | Status: AC
  Administered 2022-03-13: 2 mL

## 2022-03-13 NOTE — Telephone Encounter (Signed)
This is for Lambert Mody, please call call patient at [phone]

## 2022-03-13 NOTE — Progress Notes (Unsigned)
Trigger Point Injection  Indication: Cervical myofascial pain not relieved by medication management and other conservative care.  Informed consent was obtained after describing risk and benefits of the procedure with the patient, this includes bleeding, bruising, infection and medication side effects.  The patient wishes to proceed and has given written consent.  The patient was placed in a seated position.  The area of pain was marked and prepped with Betadine.  It was entered with a 25-gauge 1/2 inch needle and a total of 5 mL of 1% lidocaine and normal saline was injected into a total of 7 trigger points, after negative draw back for blood.  The patient tolerated the procedure well.  Post procedure instructions were given.

## 2022-03-14 ENCOUNTER — Encounter (HOSPITAL_BASED_OUTPATIENT_CLINIC_OR_DEPARTMENT_OTHER): Payer: Self-pay

## 2022-03-14 ENCOUNTER — Ambulatory Visit: Payer: BC Managed Care – PPO

## 2022-03-14 DIAGNOSIS — M542 Cervicalgia: Secondary | ICD-10-CM

## 2022-03-14 DIAGNOSIS — K3184 Gastroparesis: Secondary | ICD-10-CM | POA: Diagnosis not present

## 2022-03-14 DIAGNOSIS — M6281 Muscle weakness (generalized): Secondary | ICD-10-CM

## 2022-03-14 DIAGNOSIS — M25551 Pain in right hip: Secondary | ICD-10-CM | POA: Diagnosis not present

## 2022-03-14 DIAGNOSIS — R262 Difficulty in walking, not elsewhere classified: Secondary | ICD-10-CM

## 2022-03-14 DIAGNOSIS — K581 Irritable bowel syndrome with constipation: Secondary | ICD-10-CM | POA: Diagnosis not present

## 2022-03-14 NOTE — Telephone Encounter (Signed)
Called patient

## 2022-03-14 NOTE — Telephone Encounter (Signed)
Work note uploaded to [employer]

## 2022-03-14 NOTE — Therapy (Signed)
OUTPATIENT PHYSICAL THERAPY TREATMENT NOTE   Patient Name: Brianna [name] MRN: [id number] DOB:09/18/1995, 26 y.o., female Today's Date: 03/14/2022  PCP: Viole, Nicholas Steven, MD REFERRING PROVIDER: Bokshan, Steven, MD  END OF SESSION:   PT End of Session - 03/14/22 1706     Visit Number 18    Number of Visits 22    Date for PT Re-Evaluation 04/19/22    Authorization Type [employer]    Authorization Time Period FOTO v6, v10    PT Start Time 1706    PT Stop Time 1744    PT Time Calculation (min) 38 min    Activity Tolerance Patient tolerated treatment well    Behavior During Therapy WFL for tasks assessed/performed                 Past Medical History:  Diagnosis Date   Acute recurrent pansinusitis 10/21/2019   ADHD (attention deficit hyperactivity disorder)    inattentive   Anemia    Anxiety    Asthma    Bronchitis    Cat allergy due to both airborne and skin contact 05/01/2016   Chronic sore throat 05/01/2016   COVID 2020   Depression    Fibromyalgia    Gastroparesis    GERD (gastroesophageal reflux disease)    Hypermobile Ehlers-Danlos syndrome    Irregular heart rate    Keratosis pilaris    Migraine    Seasonal allergic rhinitis 03/11/2014   Past Surgical History:  Procedure Laterality Date   ESOPHAGOGASTRODUODENOSCOPY ENDOSCOPY     03/2020   JOINT REPLACEMENT     LABRAL REPAIR Right 03/02/2021   Procedure: RIGHT HIP ARTHROSCOPY WITH LABRAL REPAIR AND PINCE DEBRIDEMENT;  Surgeon: Bokshan, Steven, MD;  Location: [facility] OR;  Service: Orthopedics;  Laterality: Right;   SMART PILL PROCEDURE     12/2020   TYMPANOSTOMY TUBE PLACEMENT Bilateral    WISDOM TOOTH EXTRACTION     Patient Active Problem List   Diagnosis Date Noted   Cervical adenopathy 06/10/2021   Chronic pruritus 06/10/2021   Exercise-induced asthma 06/09/2021   Chronic venous insufficiency 06/03/2021   Orthostatic lightheadedness 06/03/2021   Purple toe syndrome of both feet (HCC)  06/03/2021   Autonomic dysfunction 06/03/2021   Labral tear of hip, degenerative 04/13/2021   Migraine headache 03/01/2021   Headache 11/30/2020   Positive ANA (antinuclear antibody) 11/15/2020   Fibromyalgia 11/02/2020   Hair loss 11/02/2020   Muscle spasticity 10/22/2020   Hypermobile Ehlers-Danlos syndrome 10/22/2020   Paresthesia of bilateral legs 10/06/2020   Myalgia 10/06/2020   Chronic pelvic pain in female 10/06/2020   Chronic abdominal pain 10/06/2020   Irritable colon 08/02/2020   Pelvic floor dysfunction 08/02/2020   Irritant dermatitis 08/02/2020   Gastroesophageal reflux disease 06/28/2020   Severe recurrent major depression without psychotic features (HCC) 06/28/2020   Polyarthralgia 06/28/2020   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 06/28/2020   Tachycardia 06/28/2020   History of COVID-19 06/28/2020   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 deficit 06/28/2020   Recurrent major depressive disorder, in full remission (HCC) 06/28/2020   Atypical chest pain 06/22/2020   Dizziness 06/22/2020   Palpitations 06/14/2020   Anxiety 05/04/2020   Internal and external hemorrhoids without complication 04/29/2020   Nondiabetic gastroparesis 03/03/2020   ETD (Eustachian tube dysfunction), bilateral 10/21/2019   Acne vulgaris 03/11/2014   Mild intermittent asthma without complication 03/11/2014   Seasonal allergies 03/11/2014    REFERRING DIAG: M25.551 (ICD-10-CM) - Pain in right hip  THERAPY DIAG:    Pain in right hip  Neck pain  Muscle weakness (generalized)  Difficulty in walking, not elsewhere classified  Rationale for Evaluation and Treatment Rehabilitation  PERTINENT HISTORY: 03/02/2021: RIGHT HIP ARTHROSCOPY WITH LABRAL REPAIR AND PINCE DEBRIDEMENT, anxiety, depression, fibromyalgia, asthma, Symptomatic Ehler's-Danlos syndrome  PRECAUTIONS: 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 She reports continues 3/10 Rt hip pain today.   PAIN:  Are you having pain? Yes: NPRS scale: 3/10 Rt hip, 6/10 upper back, 6/10 neck Pain location: Rt posterior neck/ upper back (Hip?) Pain description: Achy, sharp Aggravating factors: BIL cervical rotation, looking down, looking down at computer screen/ phone Relieving factors: I[employer]   OBJECTIVE: (objective measures completed at initial evaluation unless otherwise dated)   DIAGNOSTIC FINDINGS: MR Hip Right with contrast: IMPRESSION: 1. Full-thickness tear of the right anterior superior labrum.   PATIENT SURVEYS:  FOTO Cervical: 59%, predicted 67% in 11 visits; Hip: 58%, predicted 71% in 13 visits 12/08/2021: Cervical: 51%; Hip: 61% 01/18/2022: Cervical: 60%   COGNITION:           Overall cognitive status: Within functional limits for tasks assessed                          SENSATION: Not tested     POSTURE:  Forward head/ BIL rounded shoulders   PALPATION: TTP with palpable trigger points to BIL cervical paraspinals/ UT/ mid trap           PASSIVE ACCESSORIES: Hypermobile/ painful CPAs C2-C3, C5-C7 Hypomobile/ painful CPAs C4-C5, T1-T8   CERVICAL ROM:    AROM A/PROM (deg) 10/25/2021 AROM 11/16/2021 AROM 01/03/2022  Flexion 40p! 68   Extension 65p!! 42p! 60, minor pain  Right lateral flexion 45 45   Left lateral flexion 45 45   Right rotation 61p! 81   Left rotation 68p! 81    (Blank rows = not tested)   UE ROM:   AROM Right 10/25/2021 Left 10/25/2021  Shoulder flexion WNL WNL  Shoulder abduction WNL WNL   (Blank rows = not tested)   UE MMT:   MMT Right 10/25/2021 Left 10/25/2021 Right 12/08/2021 Left 12/08/2021 Right 01/03/2022 Left 01/03/2022  Shoulder flexion 5/5 5/5      Shoulder abduction 5/5 5/5      Cervical flexion 4/5p!     Cervical extension 4/5p!     Cervical side bend 5/5 5/5      Shoulder IR 5/5 5/5      Shoulder ER 5/5 5/5      Mid trap 3/5 3/5 4/5  4/5 4+/5 4+/5  Low trap 3/5 3/5 4/5 4/5 4+/5 4+/5  Latissimus dorsi 3/5 3/5 4+/5 4+/5 4+/5 4+/5   (Blank rows = not tested)   CERVICAL SPECIAL TESTS:  Cervical distraction: (-) Cervical compression: (+) Quadrant testing: (+) BIL     FUNCTIONAL TESTS:  DNF endurance test: 15 seconds    01/03/2022: DNF endurance: 35 seconds    01/18/2022: DNF endurance: 58 seconds  PALPATION: 02/15/2022: TTP to Rt greater trochanter/ anterior labrum  LE ROM:  A/PROM Right 02/15/2022 Left 02/15/2022 Right 03/14/2022  Hip flexion 95/110p! 110/140 135/145 mild p!  Hip abduction 45/55p! 40/60 40/55, mild p!  Hip adduction 15/20p! 20/23 20/25 mild p!  Hip internal rotation 20/30p! 30/50 30/55 mild p!  Hip external rotation 30p!/40p!! 30/45 35/45 mild p!   (Blank rows = not tested)  LE   MMT:  MMT Right 02/15/2022 Left 02/15/2022 Right 03/14/2022  Hip flexion 4+/5 5/5 5/5, minor pressure at anterior hip  Hip extension 4+/5 5/5 5/5  Hip abduction 4+/5 5/5 5/5  Hip adduction 5/5 5/5   Hip internal rotation 5/5 5/5   Hip external rotation 4+/5p! 5/5 5/5  Knee flexion 5/5 5/5   Knee extension 5/5 5/5    (Blank rows = not tested)  FUNCTIONAL TESTS:  5xSTS: 9 seconds Squat: WNL, mild pain Rt lunge: WNL, mild pain; Lt lunge: WNL     TODAY'S TREATMENT:  [facility] Adult PT Treatment:                                                DATE: 03/14/2022 Therapeutic Exercise: Sit-to-stand into Rt 8-inch step up with 15# kettlebell at chest 2x10 Romanian deadlift x8 with 45# hexbar, 2x8 with 55# barbell Squat into alternating sumo hip abduction with GTB 2x10 BIL SL Pallof press with purple sports band 2x10 with 5-sec holdin hip IR and ER on Rt Manual Therapy: N/A Neuromuscular re-ed: N/A Therapeutic Activity: Re-assessment of objective measures with pt education Modalities: N/A Self Care: N/A   [facility] Adult PT Treatment:                                                DATE: 03/11/2022 Therapeutic  Exercise: Tall-kneeling hip thrust on Airex pad with 20# cable to waist attachment 3x10 Standing hip abduction with 7# cable with pull into cross-body adduction 2x10 BIL Romanian deadlift x8 with 25# kettlebell, 2x8 with 15# kettlebell Standing windmill stretch 2x10 Mini-lunge push/pull with 7# cables 2x10 BIL Manual Therapy: N/A Neuromuscular re-ed: N/A Therapeutic Activity: N/A Modalities: N/A Self Care: N/A   [facility] Adult PT Treatment:                                                DATE: 03/01/2022 Therapeutic Exercise: Supine bicycles with handhold resistance 3x16 Quadruped donkey kicks with 5# ankle weights 2x10 BIL Side knee plank with hip abduction with 5# ankle weight 2x10 BIL 6-inch step up with knee drive with 15# KB on side of stance leg 3x10 BIL Dead lift 2x8 with 45# barbell, x8 with 55# barbell Standing Cybex hip abduction with 30# 2x10 BIL Standing Cybex hip flexion with 30# 2x10 BIL Manual Therapy: N/A Neuromuscular re-ed: N/A Therapeutic Activity: N/A Modalities: N/A Self Care: N/A        HOME EXERCISE PROGRAM: Access Code: [email or web address] URL: [email or web address] Date: 10/25/2021 Prepared by: Tucker Yarborough   Exercises - Seated Cervical Retraction  - 1 x daily - 7 x weekly - 3 sets - 10 reps - 3-sec hold - Standing Shoulder Row with Anchored Resistance  - 1 x daily - 7 x weekly - 3 sets - 10 reps - 3-sec hold - Low trap slides at wall with lift-off  - 1 x daily - 7 x weekly - 3 sets - 10 reps - 3-sec hold - Shoulder Extension with Resistance  - 1 x daily - 7 x weekly - 3 sets - 10 reps - 3-sec hold    Added 11/16/2021: - Seated Upper Trapezius Stretch  - 1 x daily - 7 x weekly - 2 sets - 1-min hold - Seated Levator Scapulae Stretch  - 1 x daily - 7 x weekly - 2 sets - 1-min hold  Added 12/03/2021: - Seated Assisted Cervical Rotation with Towel  - 1 x daily - 7 x weekly - 3 sets - 10 reps - Cervical Extension AROM with Strap  - 1 x  daily - 7 x weekly - 3 sets - 10 reps  Added 02/15/2022: - Modified Side Plank with Hip Abduction  - 1 x daily - 7 x weekly - 3 sets - 10 reps - Single Leg Bridge  - 1 x daily - 7 x weekly - 3 sets - 10 reps - Single-Leg Romanian Deadlift With Kettlebell  - 1 x daily - 7 x weekly - 3 sets - 10 reps - Supine Core Bicycle  - 1 x daily - 7 x weekly - 3 sets - 20 reps - Standard Plank  - 1 x daily - 7 x weekly - 3 sets - 30-60 seconds hold   ASSESSMENT:   CLINICAL IMPRESSION: Upon re-assessment of objective measures, the pt has made excellent improvement in her Rt hip global strength and AROM. She tolerated progressed strengthening exercises well today, and will continue to benefit from skilled PT to address her primary impairments and return to her prior level of function with less limitation.     OBJECTIVE IMPAIRMENTS Abnormal gait, decreased balance, decreased mobility, difficulty walking, decreased ROM, decreased strength, hypomobility, increased edema, increased muscle spasms, impaired flexibility, improper body mechanics, postural dysfunction, and pain.    ACTIVITY LIMITATIONS carrying, lifting, bending, sitting, standing, squatting, sleeping, stairs, transfers, and locomotion level   PARTICIPATION LIMITATIONS: meal prep, cleaning, laundry, interpersonal relationship, driving, shopping, community activity, occupation, and yard work   PERSONAL FACTORS Past/current experiences, Time since onset of injury/illness/exacerbation, and 3+ comorbidities: See medical hx  are also affecting patient's functional outcome.        GOALS: Goals reviewed with patient? Yes   SHORT TERM GOALS: Target date: 11/22/2021  Pt will report understanding and adherence to initial HEP in order to promote independence in the management of primary impairments. Baseline: HEP provided at eval Goal status: MET Pt reports adherence 12/03/21     LONG TERM GOALS: Target date: 04/19/2022    Pt will achieve a cervical  FOTO score of 67% in order to demonstrate improved functional ability as it relates to her primary impairments. Baseline: 59% 12/08/2021: 51% 01/18/2022: 60% Goal status: IN PROGRESS   2.  Pt will achieve BIL global parascapular strength of 4+/5 or higher in order to promote improved posture and prophylaxis of future mechanical neck pain Baseline: 3/5 global parascapular strength 12/08/2021: See updated MMT chart 01/03/2022: See updated MMT chart Goal status: ACHIEVED   3.  Pt will achieve global cervical AROM WNL and with 0-2/10 pain in order to get dressed with less limitation. Baseline: See AROM chart 11/16/2021: See updated AROM chart 01/03/2022: See updated AROM (4/10 pain with extension) Goal status: IN PROGRESS   4.  Pt will report ability to complete a full work day, including looking down at her computer screen with 0-3/10 pain in order to complete her work duties with less limitation. Baseline: Pt has >6/10 pain at the end of her work day 01/03/2022: Pt reports average daily pain at work of 5-6/10 Goal status: IN PROGRESS   5.  *New 01/03/2022* Pt will   report ability to sleep through the night without being waken due to pain.   Baseline: Pt woken 5x per night   01/18/2022: Pt continues to be woken 5x per night   Goal status: IN PROGRESS   6. *New 02/15/2022* Pt will achieve global Rt hip AROM within 10 degrees of Lt with 0-2/10 pain in order to get dressed with less limitation.   Baseline: See AROM chart 03/14/2022: See updated AROM chart   Goal Status: ACHIEVED   7. *New 02/15/2022* Pt will achieve 45# floor lifts x10 with 0-3/10 pain in order to demonstrate improved functional LE strength in pursuit of improved post-operative outcomes.   Baseline: Pt has pain with unweighted squat   Goal Status: INITIAL       PLAN: PT FREQUENCY: 1x/week   PT DURATION: 8 weeks   PLANNED INTERVENTIONS: Therapeutic exercises, Therapeutic activity, Neuromuscular re-education, Balance  training, Gait training, Patient/Family education, Joint mobilization, Stair training, Aquatic Therapy, Dry Needling, Electrical stimulation, Spinal mobilization, Cryotherapy, Moist heat, Taping, Vasopneumatic device, Traction, Biofeedback, Ionotophoresis 4mg/ml Dexamethasone, Manual therapy, and Re-evaluation   PLAN FOR NEXT SESSION: Progress hip strengthening/ pain modulation for improved post-op outcomes (no OMT due to EDS/ labral tear)  Yarborough, Tucker, PT, DPT 03/14/22 5:44 PM     

## 2022-03-15 MED ORDER — TRANEXAMIC ACID 650 MG PO TABS: 1300.0000 mg | ORAL_TABLET | Freq: Three times a day (TID) | ORAL | 2 refills | Status: DC

## 2022-03-15 NOTE — Telephone Encounter (Signed)
Spoke with pt - reviewed okay to have taken when she did - she did not need to wait for cycle. Discussed bleeding sounds like BTB and is typical side effect on POP, less so with slynd than norethindrone.   Discussed options to help with BTB, I.e. TXA, estradiol. Will send in TXA for BTB and she can call pharmacy if she wishes to try it.   All questions answered.   Radene Gunning, MD Attending [place], [facility] for [facility] [place], [place]

## 2022-03-16 DIAGNOSIS — F411 Generalized anxiety disorder: Secondary | ICD-10-CM | POA: Diagnosis not present

## 2022-03-21 ENCOUNTER — Ambulatory Visit: Payer: BC Managed Care – PPO

## 2022-03-21 DIAGNOSIS — F411 Generalized anxiety disorder: Secondary | ICD-10-CM | POA: Diagnosis not present

## 2022-03-21 DIAGNOSIS — G894 Chronic pain syndrome: Secondary | ICD-10-CM | POA: Diagnosis not present

## 2022-03-21 DIAGNOSIS — R262 Difficulty in walking, not elsewhere classified: Secondary | ICD-10-CM

## 2022-03-21 DIAGNOSIS — M6281 Muscle weakness (generalized): Secondary | ICD-10-CM | POA: Diagnosis not present

## 2022-03-21 DIAGNOSIS — F3341 Major depressive disorder, recurrent, in partial remission: Secondary | ICD-10-CM | POA: Diagnosis not present

## 2022-03-21 DIAGNOSIS — M25551 Pain in right hip: Secondary | ICD-10-CM | POA: Diagnosis not present

## 2022-03-21 DIAGNOSIS — F5081 Binge eating disorder: Secondary | ICD-10-CM | POA: Diagnosis not present

## 2022-03-21 DIAGNOSIS — M542 Cervicalgia: Secondary | ICD-10-CM

## 2022-03-21 NOTE — Therapy (Signed)
OUTPATIENT PHYSICAL THERAPY TREATMENT NOTE   Patient Name: Brianna [name] MRN: [id number] DOB:[birth year], 26 y.o., female Today's Date: 03/21/2022  PCP: Josephina Shih, MD REFERRING PROVIDER: Vanetta Mulders, MD  END OF SESSION:   PT End of Session - 03/21/22 1708     Visit Number 19    Number of Visits 22    Date for PT Re-Evaluation 04/19/22    Authorization Type BCBS    Authorization Time Period FOTO v6, v10    PT Start Time 1706    PT Stop Time 7340    PT Time Calculation (min) 38 min    Activity Tolerance Patient tolerated treatment well    Behavior During Therapy [facility] for tasks assessed/performed                  Past Medical History:  Diagnosis Date   Acute recurrent pansinusitis 10/21/2019   ADHD (attention deficit hyperactivity disorder)    inattentive   Anemia    Anxiety    Asthma    Bronchitis    Cat allergy due to both airborne and skin contact 05/01/2016   Chronic sore throat 05/01/2016   COVID 2020   Depression    Fibromyalgia    Gastroparesis    GERD (gastroesophageal reflux disease)    Hypermobile Ehlers-Danlos syndrome    Irregular heart rate    Keratosis pilaris    Migraine    Seasonal allergic rhinitis 03/11/2014   Past Surgical History:  Procedure Laterality Date   ESOPHAGOGASTRODUODENOSCOPY ENDOSCOPY     03/2020   JOINT REPLACEMENT     LABRAL REPAIR Right 03/02/2021   Procedure: RIGHT HIP ARTHROSCOPY WITH LABRAL REPAIR AND PINCE DEBRIDEMENT;  Surgeon: Vanetta Mulders, MD;  Location: [place];  Service: Orthopedics;  Laterality: Right;   SMART PILL PROCEDURE     12/2020   TYMPANOSTOMY TUBE PLACEMENT Bilateral    WISDOM TOOTH EXTRACTION     Patient Active Problem List   Diagnosis Date Noted   Cervical adenopathy 06/10/2021   Chronic pruritus 06/10/2021   Exercise-induced asthma 06/09/2021   Chronic venous insufficiency 06/03/2021   Orthostatic lightheadedness 06/03/2021   Purple toe syndrome of both feet ([place])  06/03/2021   Autonomic dysfunction 06/03/2021   Labral tear of hip, degenerative 04/13/2021   Migraine headache 03/01/2021   Headache 11/30/2020   Positive ANA (antinuclear antibody) 11/15/2020   Fibromyalgia 11/02/2020   Hair loss 11/02/2020   Muscle spasticity 10/22/2020   Hypermobile Ehlers-Danlos syndrome 10/22/2020   Paresthesia of bilateral legs 10/06/2020   Myalgia 10/06/2020   Chronic pelvic pain in female 10/06/2020   Chronic abdominal pain 10/06/2020   Irritable colon 08/02/2020   Pelvic floor dysfunction 08/02/2020   Irritant dermatitis 08/02/2020   Gastroesophageal reflux disease 06/28/2020   Severe recurrent major depression without psychotic features ([place]) 06/28/2020   Polyarthralgia 06/28/2020   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 06/28/2020   Tachycardia 06/28/2020   History of COVID-19 06/28/2020   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 deficit 06/28/2020   Recurrent major depressive disorder, in full remission ([place]) 06/28/2020   Atypical chest pain 06/22/2020   Dizziness 06/22/2020   Palpitations 06/14/2020   Anxiety 05/04/2020   Internal and external hemorrhoids without complication 37/01/6437   Nondiabetic gastroparesis 03/03/2020   ETD (Eustachian tube dysfunction), bilateral 10/21/2019   Acne vulgaris 03/11/2014   Mild intermittent asthma without complication 38/18/4037   Seasonal allergies 03/11/2014    REFERRING DIAG: M25.551 (ICD-10-CM) - Pain in right hip  THERAPY DIAG:  Pain in right hip  Neck pain  Muscle weakness (generalized)  Difficulty in walking, not elsewhere classified  Rationale for Evaluation and Treatment Rehabilitation  PERTINENT HISTORY: 03/02/2021: RIGHT HIP ARTHROSCOPY WITH LABRAL REPAIR AND PINCE DEBRIDEMENT, anxiety, depression, fibromyalgia, asthma, Symptomatic Ehler's-Danlos syndrome  PRECAUTIONS: 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 She reports continued HEP adherence.  PAIN:  Are you having pain? Yes: NPRS scale: 2/10 Rt hip, 6/10 upper back, 0/10 neck Pain location: Rt posterior neck/ upper back (Hip?) Pain description: Achy, sharp Aggravating factors: BIL cervical rotation, looking down, looking down at computer screen/ phone Relieving factors: [employer]   OBJECTIVE: (objective measures completed at initial evaluation unless otherwise dated)   DIAGNOSTIC FINDINGS: MR Hip Right with contrast: IMPRESSION: 1. Full-thickness tear of the right anterior superior labrum.   PATIENT SURVEYS:  FOTO Cervical: 59%, predicted 67% in 11 visits; Hip: 58%, predicted 71% in 13 visits 12/08/2021: Cervical: 51%; Hip: 61% 01/18/2022: Cervical: 60%   COGNITION:           Overall cognitive status: Within functional limits for tasks assessed                          SENSATION: Not tested     POSTURE:  Forward head/ BIL rounded shoulders   PALPATION: TTP with palpable trigger points to BIL cervical paraspinals/ UT/ mid trap           PASSIVE ACCESSORIES: Hypermobile/ painful CPAs C2-C3, C5-C7 Hypomobile/ painful CPAs C4-C5, T1-T8   CERVICAL ROM:    AROM A/PROM (deg) 10/25/2021 AROM 11/16/2021 AROM 01/03/2022  Flexion 40p! 68   Extension 65p!! 42p! 60, minor pain  Right lateral flexion 45 45   Left lateral flexion 45 45   Right rotation 61p! 81   Left rotation 68p! 81    (Blank rows = not tested)   UE ROM:   AROM Right 10/25/2021 Left 10/25/2021  Shoulder flexion WNL WNL  Shoulder abduction WNL WNL   (Blank rows = not tested)   UE MMT:   MMT Right 10/25/2021 Left 10/25/2021 Right 12/08/2021 Left 12/08/2021 Right 01/03/2022 Left 01/03/2022  Shoulder flexion 5/5 5/5      Shoulder abduction 5/5 5/5      Cervical flexion 4/5p!     Cervical extension 4/5p!     Cervical side bend 5/5 5/5      Shoulder IR 5/5 5/5      Shoulder ER 5/5 5/5      Mid trap 3/5 3/5 4/5 4/5 4+/5 4+/5  Low trap 3/5  3/5 4/5 4/5 4+/5 4+/5  Latissimus dorsi 3/5 3/5 4+/5 4+/5 4+/5 4+/5   (Blank rows = not tested)   CERVICAL SPECIAL TESTS:  Cervical distraction: (-) Cervical compression: (+) Quadrant testing: (+) BIL     FUNCTIONAL TESTS:  DNF endurance test: 15 seconds    01/03/2022: DNF endurance: 35 seconds    01/18/2022: DNF endurance: 58 seconds  PALPATION: 02/15/2022: TTP to Rt greater trochanter/ anterior labrum  LE ROM:  A/PROM Right 02/15/2022 Left 02/15/2022 Right 03/14/2022  Hip flexion 95/110p! 110/140 135/145 mild p!  Hip abduction 45/55p! 40/60 40/55, mild p!  Hip adduction 15/20p! 20/23 20/25 mild p!  Hip internal rotation 20/30p! 30/50 30/55 mild p!  Hip external rotation 30p!/40p!! 30/45 35/45 mild p!   (Blank rows = not tested)  LE MMT:  MMT  Right 02/15/2022 Left 02/15/2022 Right 03/14/2022  Hip flexion 4+/5 5/5 5/5, minor pressure at anterior hip  Hip extension 4+/5 5/5 5/5  Hip abduction 4+/5 5/5 5/5  Hip adduction 5/5 5/5   Hip internal rotation 5/5 5/5   Hip external rotation 4+/5p! 5/5 5/5  Knee flexion 5/5 5/5   Knee extension 5/5 5/5    (Blank rows = not tested)  FUNCTIONAL TESTS:  5xSTS: 9 seconds Squat: WNL, mild pain Rt lunge: WNL, mild pain; Lt lunge: WNL     TODAY'S TREATMENT:  [facility] Adult PT Treatment:                                                DATE: 03/21/2022 Therapeutic Exercise: [place] split squat hops with 10# kettlebell 2x10 Deadlift x10 with 45# barbell, x8 with 65# barbell, x6 with 75# barbell Squat into alternating sumo hip abduction 3x10 BIL SL Pallof press with red sports cord x10 with 5-sec hold BIL on each side Seated piriformis stretch x69mn BIL Manual Therapy: N/A Neuromuscular re-ed: N/A Therapeutic Activity: N/A Modalities: N/A Self Care: N/A   [facility] Adult PT Treatment:                                                DATE: 03/14/2022 Therapeutic Exercise: Sit-to-stand into Rt 8-inch step up with 15# kettlebell  at chest 2x10 RBenindeadlift x8 with 45# hexbar, 2x8 with 55# barbell Squat into alternating sumo hip abduction with GTB 2x10 BIL SL Pallof press with purple sports band 2x10 with 5-sec holdin hip IR and ER on Rt Manual Therapy: N/A Neuromuscular re-ed: N/A Therapeutic Activity: Re-assessment of objective measures with pt education Modalities: N/A Self Care: N/A   [facility] Adult PT Treatment:                                                DATE: 03/11/2022 Therapeutic Exercise: Tall-kneeling hip thrust on Airex pad with 20# cable to waist attachment 3x10 Standing hip abduction with 7# cable with pull into cross-body adduction 2x10 BIL Romanian deadlift x8 with 25# kettlebell, 2x8 with 15# kettlebell Standing windmill stretch 2x10 Mini-lunge push/pull with 7# cables 2x10 BIL Manual Therapy: N/A Neuromuscular re-ed: N/A Therapeutic Activity: N/A Modalities: N/A Self Care: N/A       HOME EXERCISE PROGRAM: Access Code: [email or web address]: [email or web address] Date: 10/25/2021 Prepared by: TVanessa   Exercises - Seated Cervical Retraction  - 1 x daily - 7 x weekly - 3 sets - 10 reps - 3-sec hold - Standing Shoulder Row with Anchored Resistance  - 1 x daily - 7 x weekly - 3 sets - 10 reps - 3-sec hold - Low trap slides at wall with lift-off  - 1 x daily - 7 x weekly - 3 sets - 10 reps - 3-sec hold - Shoulder Extension with Resistance  - 1 x daily - 7 x weekly - 3 sets - 10 reps - 3-sec hold  Added 11/16/2021: - Seated Upper Trapezius Stretch  - 1 x daily - 7 x weekly - 2 sets - 1-min hold -  Seated Levator Scapulae Stretch  - 1 x daily - 7 x weekly - 2 sets - 1-min hold  Added 12/03/2021: - Seated Assisted Cervical Rotation with Towel  - 1 x daily - 7 x weekly - 3 sets - 10 reps - Cervical Extension AROM with Strap  - 1 x daily - 7 x weekly - 3 sets - 10 reps  Added 02/15/2022: - Modified Side Plank with Hip Abduction  - 1 x daily - 7 x weekly - 3 sets  - 10 reps - Single Leg Bridge  - 1 x daily - 7 x weekly - 3 sets - 10 reps - Single-Leg [place] Deadlift With Kettlebell  - 1 x daily - 7 x weekly - 3 sets - 10 reps - Supine Core Bicycle  - 1 x daily - 7 x weekly - 3 sets - 20 reps - Standard Plank  - 1 x daily - 7 x weekly - 3 sets - 30-60 seconds hold   ASSESSMENT:   CLINICAL IMPRESSION: Pt responded excellently to early attempts at plyometric exercises, demonstrating good form and no pain throughout the session. She will continue to benefit from skilled PT to address her primary impairments and return to her prior level of function with less limitation.      OBJECTIVE IMPAIRMENTS Abnormal gait, decreased balance, decreased mobility, difficulty walking, decreased ROM, decreased strength, hypomobility, increased edema, increased muscle spasms, impaired flexibility, improper body mechanics, postural dysfunction, and pain.    ACTIVITY LIMITATIONS carrying, lifting, bending, sitting, standing, squatting, sleeping, stairs, transfers, and locomotion level   PARTICIPATION LIMITATIONS: meal prep, cleaning, laundry, interpersonal relationship, driving, shopping, community activity, occupation, and yard work   PERSONAL FACTORS Past/current experiences, Time since onset of injury/illness/exacerbation, and 3+ comorbidities: See medical hx  are also affecting patient's functional outcome.        GOALS: Goals reviewed with patient? Yes   SHORT TERM GOALS: Target date: 11/22/2021  Pt will report understanding and adherence to initial HEP in order to promote independence in the management of primary impairments. Baseline: HEP provided at eval Goal status: MET Pt reports adherence 12/03/21     LONG TERM GOALS: Target date: 04/19/2022    Pt will achieve a cervical FOTO score of 67% in order to demonstrate improved functional ability as it relates to her primary impairments. Baseline: 59% 12/08/2021: 51% 01/18/2022: 60% Goal status: IN PROGRESS    2.  Pt will achieve BIL global parascapular strength of 4+/5 or higher in order to promote improved posture and prophylaxis of future mechanical neck pain Baseline: 3/5 global parascapular strength 12/08/2021: See updated MMT chart 01/03/2022: See updated MMT chart Goal status: ACHIEVED   3.  Pt will achieve global cervical AROM WNL and with 0-2/10 pain in order to get dressed with less limitation. Baseline: See AROM chart 11/16/2021: See updated AROM chart 01/03/2022: See updated AROM (4/10 pain with extension) Goal status: IN PROGRESS   4.  Pt will report ability to complete a full work day, including looking down at her computer screen with 0-3/10 pain in order to complete her work duties with less limitation. Baseline: Pt has >6/10 pain at the end of her work day 01/03/2022: Pt reports average daily pain at work of 5-6/10 03/21/2022: 3/10 average pain on a workday Goal status: ACHIEVED   5.  *New 01/03/2022* Pt will report ability to sleep through the night without being waken due to pain.   Baseline: Pt woken 5x per night   01/18/2022:  Pt continues to be woken 5x per night 03/21/2022: Pt reports being able to sleep through the night not limited by pain   Goal status: ACHIEVED   6. *New 02/15/2022* Pt will achieve global Rt hip AROM within 10 degrees of Lt with 0-2/10 pain in order to get dressed with less limitation.   Baseline: See AROM chart 03/14/2022: See updated AROM chart   Goal Status: ACHIEVED   7. *New 02/15/2022* Pt will achieve 45# floor lifts x10 with 0-3/10 pain in order to demonstrate improved functional LE strength in pursuit of improved post-operative outcomes.   Baseline: Pt has pain with unweighted squat   Goal Status: INITIAL       PLAN: PT FREQUENCY: 1x/week   PT DURATION: 8 weeks   PLANNED INTERVENTIONS: Therapeutic exercises, Therapeutic activity, Neuromuscular re-education, Balance training, Gait training, Patient/Family education, Joint mobilization,  Stair training, Aquatic Therapy, Dry Needling, Electrical stimulation, Spinal mobilization, Cryotherapy, Moist heat, Taping, Vasopneumatic device, Traction, Biofeedback, Ionotophoresis 60m/ml Dexamethasone, Manual therapy, and Re-evaluation   PLAN FOR NEXT SESSION: Progress hip strengthening/ pain modulation for improved post-op outcomes (no OMT due to EDS/ labral tear)  YVanessa [place] PT, DPT 03/21/22 5:45 PM

## 2022-03-22 DIAGNOSIS — Z113 Encounter for screening for infections with a predominantly sexual mode of transmission: Secondary | ICD-10-CM | POA: Diagnosis not present

## 2022-03-23 DIAGNOSIS — F411 Generalized anxiety disorder: Secondary | ICD-10-CM | POA: Diagnosis not present

## 2022-03-26 DIAGNOSIS — G894 Chronic pain syndrome: Secondary | ICD-10-CM | POA: Diagnosis not present

## 2022-03-26 DIAGNOSIS — F411 Generalized anxiety disorder: Secondary | ICD-10-CM | POA: Diagnosis not present

## 2022-03-26 DIAGNOSIS — F3341 Major depressive disorder, recurrent, in partial remission: Secondary | ICD-10-CM | POA: Diagnosis not present

## 2022-03-26 DIAGNOSIS — F5081 Binge eating disorder: Secondary | ICD-10-CM | POA: Diagnosis not present

## 2022-03-27 DIAGNOSIS — J302 Other seasonal allergic rhinitis: Secondary | ICD-10-CM | POA: Diagnosis not present

## 2022-03-27 DIAGNOSIS — H9203 Otalgia, bilateral: Secondary | ICD-10-CM | POA: Diagnosis not present

## 2022-03-27 DIAGNOSIS — M26609 Unspecified temporomandibular joint disorder, unspecified side: Secondary | ICD-10-CM | POA: Diagnosis not present

## 2022-03-27 DIAGNOSIS — H9313 Tinnitus, bilateral: Secondary | ICD-10-CM | POA: Diagnosis not present

## 2022-03-28 DIAGNOSIS — K3184 Gastroparesis: Secondary | ICD-10-CM | POA: Diagnosis not present

## 2022-03-28 DIAGNOSIS — E611 Iron deficiency: Secondary | ICD-10-CM | POA: Diagnosis not present

## 2022-03-29 ENCOUNTER — Ambulatory Visit: Payer: BC Managed Care – PPO | Attending: Student in an Organized Health Care Education/Training Program

## 2022-03-29 DIAGNOSIS — M6281 Muscle weakness (generalized): Secondary | ICD-10-CM | POA: Diagnosis not present

## 2022-03-29 DIAGNOSIS — R262 Difficulty in walking, not elsewhere classified: Secondary | ICD-10-CM | POA: Diagnosis not present

## 2022-03-29 DIAGNOSIS — M25551 Pain in right hip: Secondary | ICD-10-CM | POA: Insufficient documentation

## 2022-03-29 DIAGNOSIS — M542 Cervicalgia: Secondary | ICD-10-CM | POA: Insufficient documentation

## 2022-03-29 NOTE — Therapy (Signed)
OUTPATIENT PHYSICAL THERAPY TREATMENT NOTE   Patient Name: Genetta Fiero MRN: [id number] DOB:1995-09-22, 26 y.o., female Today's Date: 03/29/2022  PCP: Josephina Shih, MD REFERRING PROVIDER: Vanetta Mulders, MD  END OF SESSION:   PT End of Session - 03/29/22 1747     Visit Number 20    Number of Visits 22    Date for PT Re-Evaluation 04/19/22    Authorization Type BCBS    Authorization Time Period FOTO v6, v10    PT Start Time 6270    PT Stop Time 3500   Pt requested to leave at 6:10 today.   PT Time Calculation (min) 24 min    Activity Tolerance Patient tolerated treatment well    Behavior During Therapy [facility] for tasks assessed/performed                   Past Medical History:  Diagnosis Date   Acute recurrent pansinusitis 10/21/2019   ADHD (attention deficit hyperactivity disorder)    inattentive   Anemia    Anxiety    Asthma    Bronchitis    Cat allergy due to both airborne and skin contact 05/01/2016   Chronic sore throat 05/01/2016   COVID 2020   Depression    Fibromyalgia    Gastroparesis    GERD (gastroesophageal reflux disease)    Hypermobile Ehlers-Danlos syndrome    Irregular heart rate    Keratosis pilaris    Migraine    Seasonal allergic rhinitis 03/11/2014   Past Surgical History:  Procedure Laterality Date   ESOPHAGOGASTRODUODENOSCOPY ENDOSCOPY     03/2020   JOINT REPLACEMENT     LABRAL REPAIR Right 03/02/2021   Procedure: RIGHT HIP ARTHROSCOPY WITH LABRAL REPAIR AND PINCE DEBRIDEMENT;  Surgeon: Vanetta Mulders, MD;  Location: [facility];  Service: Orthopedics;  Laterality: Right;   SMART PILL PROCEDURE     12/2020   TYMPANOSTOMY TUBE PLACEMENT Bilateral    WISDOM TOOTH EXTRACTION     Patient Active Problem List   Diagnosis Date Noted   Cervical adenopathy 06/10/2021   Chronic pruritus 06/10/2021   Exercise-induced asthma 06/09/2021   Chronic venous insufficiency 06/03/2021   Orthostatic lightheadedness 06/03/2021    Purple toe syndrome of both feet ([place]) 06/03/2021   Autonomic dysfunction 06/03/2021   Labral tear of hip, degenerative 04/13/2021   Migraine headache 03/01/2021   Headache 11/30/2020   Positive ANA (antinuclear antibody) 11/15/2020   Fibromyalgia 11/02/2020   Hair loss 11/02/2020   Muscle spasticity 10/22/2020   Hypermobile Ehlers-Danlos syndrome 10/22/2020   Paresthesia of bilateral legs 10/06/2020   Myalgia 10/06/2020   Chronic pelvic pain in female 10/06/2020   Chronic abdominal pain 10/06/2020   Irritable colon 08/02/2020   Pelvic floor dysfunction 08/02/2020   Irritant dermatitis 08/02/2020   Gastroesophageal reflux disease 06/28/2020   Severe recurrent major depression without psychotic features ([place]) 06/28/2020   Polyarthralgia 06/28/2020   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 06/28/2020   Tachycardia 06/28/2020   History of COVID-19 06/28/2020   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 deficit 06/28/2020   Recurrent major depressive disorder, in full remission ([place]) 06/28/2020   Atypical chest pain 06/22/2020   Dizziness 06/22/2020   Palpitations 06/14/2020   Anxiety 05/04/2020   Internal and external hemorrhoids without complication 93/81/8299   Nondiabetic gastroparesis 03/03/2020   ETD (Eustachian tube dysfunction), bilateral 10/21/2019   Acne vulgaris 03/11/2014   Mild intermittent asthma without complication 37/16/9678   Seasonal allergies 03/11/2014    REFERRING DIAG: M25.551 (  ICD-10-CM) - Pain in right hip  THERAPY DIAG:  Pain in right hip  Neck pain  Muscle weakness (generalized)  Difficulty in walking, not elsewhere classified  Rationale for Evaluation and Treatment Rehabilitation  PERTINENT HISTORY: 03/02/2021: RIGHT HIP ARTHROSCOPY WITH LABRAL REPAIR AND PINCE DEBRIDEMENT, anxiety, depression, fibromyalgia, asthma, Symptomatic Ehler's-Danlos syndrome  PRECAUTIONS: 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 She reports feeling okay today, reporting continued UT pain and mild Rt hip pain.  PAIN:  Are you having pain? Yes: NPRS scale: 2/10 Rt hip, 6/10 upper back, 0/10 neck Pain location: Rt posterior neck/ upper back (Hip?) Pain description: Achy, sharp Aggravating factors: BIL cervical rotation, looking down, looking down at computer screen/ phone Relieving factors: IcyHot   OBJECTIVE: (objective measures completed at initial evaluation unless otherwise dated)   DIAGNOSTIC FINDINGS: MR Hip Right with contrast: IMPRESSION: 1. Full-thickness tear of the right anterior superior labrum.   PATIENT SURVEYS:  FOTO Cervical: 59%, predicted 67% in 11 visits; Hip: 58%, predicted 71% in 13 visits 12/08/2021: Cervical: 51%; Hip: 61% 01/18/2022: Cervical: 60%   COGNITION:           Overall cognitive status: Within functional limits for tasks assessed                          SENSATION: Not tested     POSTURE:  Forward head/ BIL rounded shoulders   PALPATION: TTP with palpable trigger points to BIL cervical paraspinals/ UT/ mid trap           PASSIVE ACCESSORIES: Hypermobile/ painful CPAs C2-C3, C5-C7 Hypomobile/ painful CPAs C4-C5, T1-T8   CERVICAL ROM:    AROM A/PROM (deg) 10/25/2021 AROM 11/16/2021 AROM 01/03/2022  Flexion 40p! 68   Extension 65p!! 42p! 60, minor pain  Right lateral flexion 45 45   Left lateral flexion 45 45   Right rotation 61p! 81   Left rotation 68p! 81    (Blank rows = not tested)   UE ROM:   AROM Right 10/25/2021 Left 10/25/2021  Shoulder flexion WNL WNL  Shoulder abduction WNL WNL   (Blank rows = not tested)   UE MMT:   MMT Right 10/25/2021 Left 10/25/2021 Right 12/08/2021 Left 12/08/2021 Right 01/03/2022 Left 01/03/2022  Shoulder flexion 5/5 5/5      Shoulder abduction 5/5 5/5      Cervical flexion 4/5p!     Cervical extension 4/5p!     Cervical  side bend 5/5 5/5      Shoulder IR 5/5 5/5      Shoulder ER 5/5 5/5      Mid trap 3/5 3/5 4/5 4/5 4+/5 4+/5  Low trap 3/5 3/5 4/5 4/5 4+/5 4+/5  Latissimus dorsi 3/5 3/5 4+/5 4+/5 4+/5 4+/5   (Blank rows = not tested)   CERVICAL SPECIAL TESTS:  Cervical distraction: (-) Cervical compression: (+) Quadrant testing: (+) BIL     FUNCTIONAL TESTS:  DNF endurance test: 15 seconds    01/03/2022: DNF endurance: 35 seconds    01/18/2022: DNF endurance: 58 seconds  PALPATION: 02/15/2022: TTP to Rt greater trochanter/ anterior labrum  LE ROM:  A/PROM Right 02/15/2022 Left 02/15/2022 Right 03/14/2022  Hip flexion 95/110p! 110/140 135/145 mild p!  Hip abduction 45/55p! 40/60 40/55, mild p!  Hip adduction 15/20p! 20/23 20/25 mild p!  Hip internal  rotation 20/30p! 30/50 30/55 mild p!  Hip external rotation 30p!/40p!! 30/45 35/45 mild p!   (Blank rows = not tested)  LE MMT:  MMT Right 02/15/2022 Left 02/15/2022 Right 03/14/2022  Hip flexion 4+/5 5/5 5/5, minor pressure at anterior hip  Hip extension 4+/5 5/5 5/5  Hip abduction 4+/5 5/5 5/5  Hip adduction 5/5 5/5   Hip internal rotation 5/5 5/5   Hip external rotation 4+/5p! 5/5 5/5  Knee flexion 5/5 5/5   Knee extension 5/5 5/5    (Blank rows = not tested)  FUNCTIONAL TESTS:  5xSTS: 9 seconds Squat: WNL, mild pain Rt lunge: WNL, mild pain; Lt lunge: WNL     TODAY'S TREATMENT:  [facility] Adult PT Treatment:                                                DATE: 03/29/2022 Therapeutic Exercise: Mini dragon squat with 15# kettlebell 2x15 BIL Straight-leg deadlift with 45# barbell 2x12 [place] split squat hops with 10# kettlebell in contralateral hand 2x10 BIL Alternating side slides with GTB around thighs 3x10 Manual Therapy: N/A Neuromuscular re-ed: N/A Therapeutic Activity: N/A Modalities: N/A Self Care: N/A   [facility] Adult PT Treatment:                                                DATE: 03/21/2022 Therapeutic  Exercise: [place] split squat hops with 10# kettlebell 2x10 Deadlift x10 with 45# barbell, x8 with 65# barbell, x6 with 75# barbell Squat into alternating sumo hip abduction 3x10 BIL SL Pallof press with red sports cord x10 with 5-sec hold BIL on each side Seated piriformis stretch x45mn BIL Manual Therapy: N/A Neuromuscular re-ed: N/A Therapeutic Activity: N/A Modalities: N/A Self Care: N/A   [facility] Adult PT Treatment:                                                DATE: 03/14/2022 Therapeutic Exercise: Sit-to-stand into Rt 8-inch step up with 15# kettlebell at chest 2x10 RBenindeadlift x8 with 45# hexbar, 2x8 with 55# barbell Squat into alternating sumo hip abduction with GTB 2x10 BIL SL Pallof press with purple sports band 2x10 with 5-sec holdin hip IR and ER on Rt Manual Therapy: N/A Neuromuscular re-ed: N/A Therapeutic Activity: Re-assessment of objective measures with pt education Modalities: N/A Self Care: N/A       HOME EXERCISE PROGRAM: Access Code: [email or web address]: [email or web address] Date: 10/25/2021 Prepared by: TVanessa [place]  Exercises - Seated Cervical Retraction  - 1 x daily - 7 x weekly - 3 sets - 10 reps - 3-sec hold - Standing Shoulder Row with Anchored Resistance  - 1 x daily - 7 x weekly - 3 sets - 10 reps - 3-sec hold - Low trap slides at wall with lift-off  - 1 x daily - 7 x weekly - 3 sets - 10 reps - 3-sec hold - Shoulder Extension with Resistance  - 1 x daily - 7 x weekly - 3 sets - 10 reps - 3-sec hold  Added 11/16/2021: - Seated Upper Trapezius Stretch  -  1 x daily - 7 x weekly - 2 sets - 1-min hold - Seated Levator Scapulae Stretch  - 1 x daily - 7 x weekly - 2 sets - 1-min hold  Added 12/03/2021: - Seated Assisted Cervical Rotation with Towel  - 1 x daily - 7 x weekly - 3 sets - 10 reps - Cervical Extension AROM with Strap  - 1 x daily - 7 x weekly - 3 sets - 10 reps  Added 02/15/2022: - Modified Side Plank  with Hip Abduction  - 1 x daily - 7 x weekly - 3 sets - 10 reps - Single Leg Bridge  - 1 x daily - 7 x weekly - 3 sets - 10 reps - Single-Leg [place] Deadlift With Kettlebell  - 1 x daily - 7 x weekly - 3 sets - 10 reps - Supine Core Bicycle  - 1 x daily - 7 x weekly - 3 sets - 20 reps - Standard Plank  - 1 x daily - 7 x weekly - 3 sets - 30-60 seconds hold   ASSESSMENT:   CLINICAL IMPRESSION: Due to pt requesting to leave 20 minutes early, the session was truncated today. She tolerated progressed hip strengthening and plyometric exercises well with no good form and no pain. She will continue to benefit from skilled PT to address her primary impairments and return to her prior level of function with less limitation.     OBJECTIVE IMPAIRMENTS Abnormal gait, decreased balance, decreased mobility, difficulty walking, decreased ROM, decreased strength, hypomobility, increased edema, increased muscle spasms, impaired flexibility, improper body mechanics, postural dysfunction, and pain.    ACTIVITY LIMITATIONS carrying, lifting, bending, sitting, standing, squatting, sleeping, stairs, transfers, and locomotion level   PARTICIPATION LIMITATIONS: meal prep, cleaning, laundry, interpersonal relationship, driving, shopping, community activity, occupation, and yard work   PERSONAL FACTORS Past/current experiences, Time since onset of injury/illness/exacerbation, and 3+ comorbidities: See medical hx  are also affecting patient's functional outcome.        GOALS: Goals reviewed with patient? Yes   SHORT TERM GOALS: Target date: 11/22/2021  Pt will report understanding and adherence to initial HEP in order to promote independence in the management of primary impairments. Baseline: HEP provided at eval Goal status: MET Pt reports adherence 12/03/21     LONG TERM GOALS: Target date: 04/19/2022    Pt will achieve a cervical FOTO score of 67% in order to demonstrate improved functional ability as it  relates to her primary impairments. Baseline: 59% 12/08/2021: 51% 01/18/2022: 60% Goal status: IN PROGRESS   2.  Pt will achieve BIL global parascapular strength of 4+/5 or higher in order to promote improved posture and prophylaxis of future mechanical neck pain Baseline: 3/5 global parascapular strength 12/08/2021: See updated MMT chart 01/03/2022: See updated MMT chart Goal status: ACHIEVED   3.  Pt will achieve global cervical AROM WNL and with 0-2/10 pain in order to get dressed with less limitation. Baseline: See AROM chart 11/16/2021: See updated AROM chart 01/03/2022: See updated AROM (4/10 pain with extension) Goal status: IN PROGRESS   4.  Pt will report ability to complete a full work day, including looking down at her computer screen with 0-3/10 pain in order to complete her work duties with less limitation. Baseline: Pt has >6/10 pain at the end of her work day 01/03/2022: Pt reports average daily pain at work of 5-6/10 03/21/2022: 3/10 average pain on a workday Goal status: ACHIEVED   5.  *New 01/03/2022* Pt  will report ability to sleep through the night without being waken due to pain.   Baseline: Pt woken 5x per night   01/18/2022: Pt continues to be woken 5x per night 03/21/2022: Pt reports being able to sleep through the night not limited by pain   Goal status: ACHIEVED   6. *New 02/15/2022* Pt will achieve global Rt hip AROM within 10 degrees of Lt with 0-2/10 pain in order to get dressed with less limitation.   Baseline: See AROM chart 03/14/2022: See updated AROM chart   Goal Status: ACHIEVED   7. *New 02/15/2022* Pt will achieve 45# floor lifts x10 with 0-3/10 pain in order to demonstrate improved functional LE strength in pursuit of improved post-operative outcomes.   Baseline: Pt has pain with unweighted squat   Goal Status: INITIAL       PLAN: PT FREQUENCY: 1x/week   PT DURATION: 8 weeks   PLANNED INTERVENTIONS: Therapeutic exercises, Therapeutic activity,  Neuromuscular re-education, Balance training, Gait training, Patient/Family education, Joint mobilization, Stair training, Aquatic Therapy, Dry Needling, Electrical stimulation, Spinal mobilization, Cryotherapy, Moist heat, Taping, Vasopneumatic device, Traction, Biofeedback, Ionotophoresis 71m/ml Dexamethasone, Manual therapy, and Re-evaluation   PLAN FOR NEXT SESSION: Progress hip strengthening/ pain modulation for improved post-op outcomes (no OMT due to EDS/ labral tear)  YVanessa Lloyd PT, DPT 03/29/22 6:10 PM

## 2022-03-30 DIAGNOSIS — F411 Generalized anxiety disorder: Secondary | ICD-10-CM | POA: Diagnosis not present

## 2022-04-03 ENCOUNTER — Ambulatory Visit (INDEPENDENT_AMBULATORY_CARE_PROVIDER_SITE_OTHER): Payer: BC Managed Care – PPO | Admitting: Family Medicine

## 2022-04-03 DIAGNOSIS — K3184 Gastroparesis: Secondary | ICD-10-CM | POA: Diagnosis not present

## 2022-04-03 NOTE — Progress Notes (Signed)
Telehealth Encounter  I connected with Aryannah Mohon (MRN [id number]) on 04/03/2022 by video, verified that I was speaking with the correct person using two identifiers, and that the patient was in a private environment conducive to confidentiality.  The patient agreed to proceed. PCP Otho Bellows, MD, [facility] Eating therapist Alease Medina, MSW, LCSW at [facility] in [place] therapist (in-person) [facility] [place], [place] at [facility] in [place] participating in visit were patient and provider (registered dietitian) Kennith Center, PhD, RD, LDN, CEDRD.  Provider was located at [place] during this telehealth encounter; patient was at home.  Appt start time: 160 end time: 1700 (1 hour)   Reason for telehealth visit: Referred by Kerin Salen, PhD for Medical Nutrition Therapy related to IBS and gastroparesis, as well as food anxiety, and difficulty eating.  She wants to reverse the weight loss related to the above.  She weighed 116 lb in March 2022.  Relevant history/background: Makaylynn has a history of disordered eating, which has fluctuated between restriction and bingeing as early as elementary school. GI issues started at around age 81, and got especially bad in 2020. Gastroparesis was diagnosed June 2022, with no identified cause.  Varshini suspects vagus nerve damage possibly due to COVID she got in fall 2020.  Her only COVID symptoms were GI-related.  In high school, Ida self-injured, which she did again in 2021, as she tried to cope with stress.  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    Assessment: Fredrica has usually been having a protein shake for bkfst, a small lunch, and a dinner of a protein food, starch (often rice), and veg  on weekdays, and she tends to have one or more plus snacks nightly.  Weekends have little structure.  She is still troubled by evening snacking, feeling a loss of control at this time, including yesterday's evening snack despite a very low intake throughout the day.   She is scheduled for R hip arthroscopy with labral reconstruction on 04/25/22, ~14 months after her last R hip labrum repair.   She had a GI F/U appt October 24; has finally obtained Linzess Rx, which she will try on Saturday (needs to try first on a day she can be home all day).   Michiko started her Invisalign orthodontic treatment, which has to be in place 20 hrs/day. She has done pretty well following her eating schedule agreed on at last appt, at least on weekends, but struggles with limiting eating time in the evening.    24-hr recall (no kcal estimate b/c of recall problem):  (Up at 11 AM) B ( AM)-   Snk ( AM)-    water  L ( PM)-        1/2 glazed donut, ????  Snk ( PM)-    ---  D (7 PM)-     1 c rice&beans&chx, 1 c tortilla chips, salsa   Snk (8 PM)-  3 gingerbread cookies, 1 handful Gummies  Typical day? No  ~40 oz water all yesterday; usually gets ~75 oz water per day.  Not sure of day's intake.    Weight: 107 lb on 10/2 at [facility] appt.  Pt reports normal wt fluctuation, but mostly stable.     Usual eating pattern: 3 small meals and 2-3 snacks per day. Usual physical activity:  Only PT exercises several times a week. Sleep: Still poor; estimates 5 interrupted hrs per night.  24-hr recall:  (Up at 8 AM) B (10:45 AM)-  1 vanilla scone, orange marmalade, whipped cream, 1 crab cake, wilted greens, 1 poached egg, 1/2 c home-fries, water, hot tea Snk ( AM)-  water L ( PM)-  --- Snk ( PM)-  water D (6:30 PM)-  1 c veg-fried rice, 3 shrimp, water Snk (9 PM)-  1 c chips, salsa, 2 Oreos, 1 handful Gummies, water Typical day? No.  Met friend for brunch.  More typical: protein shake for bkfst, lunch of leftovers or  sandwich or soup, granola bar, yogurt, dinner of protein, starch, veg.    Previous estimated kcal intakes: 06/23/21:  1300 03/28/21: 1925 02/21/21:  1880  01/25/21:      ?? 12/28/20:  1160  11/29/20:  2230  11/08/20:  1170  Intervention: Reviewed diet and recent medical history, and confirmed behavioral goals.  Encouraged Sharelle to focus more on listening to her body and eating what appeals to her during recovery from surgery, with adequate energy intake and food distribution through the day.     For recommendations and goals, see Patient Instructions.    Follow-up: Remote appt in 4 weeks.  Luz Burcher,JEANNIE

## 2022-04-03 NOTE — Patient Instructions (Signed)
-   During recovery from your surgery, focus most on listening to your body, and try to not worry about your food choices, but instead focus on getting just enough food throughout the day, including adequate protein and carb.    - Evening snacking: Think twice about the foods you bring home.  Make sure you buy plenty of good alternatives such as low-fat string cheese, fruit, yogurt, nuts/seeds, PB2 on rice cakes, cereal with oat or soy milk.   - To help the problem of not replacing your invisialign after dinner:  Try keeping your retainers near you along with toothpaste and toothbrush.   A consistent eating schedule will be especially helpful once you start Invisalign treatment:   Meal    No later than Bkfst      7:15 Snack  10:00 Lunch  12:00 Snack    3:00 Dinner    6:30 PM Snack    7:30 - optional    - Make a list of foods you want to have on hand at your mom's when you are there post-surgery.  Email that list to [facility] for feedback.     The following process is designed to help you give yourself some grace as well as to help with future self-regulation (= better choices).   Analyzing a regretted food choice: 1. Recognize (name) the regretted food choice.       Then address your behavior with curiosity rather than with judgment, asking yourself:  2. Why did I make that choice; what led to that decision? 3. What can I put in place to help me make a better choice next time (not fall into that trap)? ALTERNATIVELY, ask yourself:  What do I truly FEEL right now?  (Refer to your FEELINGS list.) What do I really NEED right now?  (Refer to your NEEDS list.) What can I do RIGHT NOW to address my needs? WRITE your answers to the above questions, and commit to the recommendations you come up with.   Talk with Dennard Schaumann about the above process when you see her on Sunday.    Follow-up remote appt on Monday, Dec 18 at 2:30 PM.

## 2022-04-04 ENCOUNTER — Ambulatory Visit: Payer: BC Managed Care – PPO

## 2022-04-04 DIAGNOSIS — M6281 Muscle weakness (generalized): Secondary | ICD-10-CM | POA: Diagnosis not present

## 2022-04-04 DIAGNOSIS — M542 Cervicalgia: Secondary | ICD-10-CM

## 2022-04-04 DIAGNOSIS — M25551 Pain in right hip: Secondary | ICD-10-CM

## 2022-04-04 DIAGNOSIS — R262 Difficulty in walking, not elsewhere classified: Secondary | ICD-10-CM

## 2022-04-04 NOTE — Therapy (Signed)
OUTPATIENT PHYSICAL THERAPY TREATMENT NOTE   Patient Name: Brianna [name] MRN: [id number] DOB:04-18-1996, 26 y.o., female Today's Date: 04/04/2022  PCP: Josephina Shih, MD REFERRING PROVIDER: Vanetta Mulders, MD  END OF SESSION:   PT End of Session - 04/04/22 1709     Visit Number 21    Number of Visits 22    Date for PT Re-Evaluation 04/19/22    Authorization Type BCBS    Authorization Time Period FOTO v6, v10    PT Start Time 1708    PT Stop Time 1607    PT Time Calculation (min) 38 min    Activity Tolerance Patient tolerated treatment well    Behavior During Therapy [facility] for tasks assessed/performed                    Past Medical History:  Diagnosis Date   Acute recurrent pansinusitis 10/21/2019   ADHD (attention deficit hyperactivity disorder)    inattentive   Anemia    Anxiety    Asthma    Bronchitis    Cat allergy due to both airborne and skin contact 05/01/2016   Chronic sore throat 05/01/2016   COVID 2020   Depression    Fibromyalgia    Gastroparesis    GERD (gastroesophageal reflux disease)    Hypermobile Ehlers-Danlos syndrome    Irregular heart rate    Keratosis pilaris    Migraine    Seasonal allergic rhinitis 03/11/2014   Past Surgical History:  Procedure Laterality Date   ESOPHAGOGASTRODUODENOSCOPY ENDOSCOPY     03/2020   JOINT REPLACEMENT     LABRAL REPAIR Right 03/02/2021   Procedure: RIGHT HIP ARTHROSCOPY WITH LABRAL REPAIR AND PINCE DEBRIDEMENT;  Surgeon: Vanetta Mulders, MD;  Location: [place];  Service: Orthopedics;  Laterality: Right;   SMART PILL PROCEDURE     12/2020   TYMPANOSTOMY TUBE PLACEMENT Bilateral    WISDOM TOOTH EXTRACTION     Patient Active Problem List   Diagnosis Date Noted   Cervical adenopathy 06/10/2021   Chronic pruritus 06/10/2021   Exercise-induced asthma 06/09/2021   Chronic venous insufficiency 06/03/2021   Orthostatic lightheadedness 06/03/2021   Purple toe syndrome of both feet  ([place]) 06/03/2021   Autonomic dysfunction 06/03/2021   Labral tear of hip, degenerative 04/13/2021   Migraine headache 03/01/2021   Headache 11/30/2020   Positive ANA (antinuclear antibody) 11/15/2020   Fibromyalgia 11/02/2020   Hair loss 11/02/2020   Muscle spasticity 10/22/2020   Hypermobile Ehlers-Danlos syndrome 10/22/2020   Paresthesia of bilateral legs 10/06/2020   Myalgia 10/06/2020   Chronic pelvic pain in female 10/06/2020   Chronic abdominal pain 10/06/2020   Irritable colon 08/02/2020   Pelvic floor dysfunction 08/02/2020   Irritant dermatitis 08/02/2020   Gastroesophageal reflux disease 06/28/2020   Severe recurrent major depression without psychotic features ([place]) 06/28/2020   Polyarthralgia 06/28/2020   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 06/28/2020   Tachycardia 06/28/2020   History of COVID-19 06/28/2020   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 deficit 06/28/2020   Recurrent major depressive disorder, in full remission ([place]) 06/28/2020   Atypical chest pain 06/22/2020   Dizziness 06/22/2020   Palpitations 06/14/2020   Anxiety 05/04/2020   Internal and external hemorrhoids without complication 37/02/6268   Nondiabetic gastroparesis 03/03/2020   ETD (Eustachian tube dysfunction), bilateral 10/21/2019   Acne vulgaris 03/11/2014   Mild intermittent asthma without complication 48/54/6270   Seasonal allergies 03/11/2014    REFERRING DIAG: M25.551 (ICD-10-CM) - Pain in right hip  THERAPY DIAG:  Pain in right hip  Neck pain  Muscle weakness (generalized)  Difficulty in walking, not elsewhere classified  Rationale for Evaluation and Treatment Rehabilitation  PERTINENT HISTORY: 03/02/2021: RIGHT HIP ARTHROSCOPY WITH LABRAL REPAIR AND PINCE DEBRIDEMENT, anxiety, depression, fibromyalgia, asthma, Symptomatic Ehler's-Danlos syndrome  PRECAUTIONS: 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   PAIN:  Are you having pain? Yes: NPRS scale: 2/10 Rt hip, 3/10 upper back, 0/10 neck Pain location: Rt posterior neck/ upper back (Hip?) Pain description: Achy, sharp Aggravating factors: BIL cervical rotation, looking down, looking down at computer screen/ phone Relieving factors: Icy[employer]   OBJECTIVE: (objective measures completed at initial evaluation unless otherwise dated)   DIAGNOSTIC FINDINGS: MR Hip Right with contrast: IMPRESSION: 1. Full-thickness tear of the right anterior superior labrum.   PATIENT SURVEYS:  FOTO Cervical: 59%, predicted 67% in 11 visits; Hip: 58%, predicted 71% in 13 visits 12/08/2021: Cervical: 51%; Hip: 61% 01/18/2022: Cervical: 60%   COGNITION:           Overall cognitive status: Within functional limits for tasks assessed                          SENSATION: Not tested     POSTURE:  Forward head/ BIL rounded shoulders   PALPATION: TTP with palpable trigger points to BIL cervical paraspinals/ UT/ mid trap           PASSIVE ACCESSORIES: Hypermobile/ painful CPAs C2-C3, C5-C7 Hypomobile/ painful CPAs C4-C5, T1-T8   CERVICAL ROM:    AROM A/PROM (deg) 10/25/2021 AROM 11/16/2021 AROM 01/03/2022  Flexion 40p! 68   Extension 65p!! 42p! 60, minor pain  Right lateral flexion 45 45   Left lateral flexion 45 45   Right rotation 61p! 81   Left rotation 68p! 81    (Blank rows = not tested)   UE ROM:   AROM Right 10/25/2021 Left 10/25/2021  Shoulder flexion WNL WNL  Shoulder abduction WNL WNL   (Blank rows = not tested)   UE MMT:   MMT Right 10/25/2021 Left 10/25/2021 Right 12/08/2021 Left 12/08/2021 Right 01/03/2022 Left 01/03/2022  Shoulder flexion 5/5 5/5      Shoulder abduction 5/5 5/5      Cervical flexion 4/5p!     Cervical extension 4/5p!     Cervical side bend 5/5 5/5      Shoulder IR 5/5 5/5      Shoulder ER 5/5 5/5      Mid trap 3/5 3/5 4/5 4/5 4+/5 4+/5  Low trap 3/5 3/5 4/5 4/5 4+/5 4+/5   Latissimus dorsi 3/5 3/5 4+/5 4+/5 4+/5 4+/5   (Blank rows = not tested)   CERVICAL SPECIAL TESTS:  Cervical distraction: (-) Cervical compression: (+) Quadrant testing: (+) BIL     FUNCTIONAL TESTS:  DNF endurance test: 15 seconds    01/03/2022: DNF endurance: 35 seconds    01/18/2022: DNF endurance: 58 seconds  PALPATION: 02/15/2022: TTP to Rt greater trochanter/ anterior labrum  LE ROM:  A/PROM Right 02/15/2022 Left 02/15/2022 Right 03/14/2022  Hip flexion 95/110p! 110/140 135/145 mild p!  Hip abduction 45/55p! 40/60 40/55, mild p!  Hip adduction 15/20p! 20/23 20/25 mild p!  Hip internal rotation 20/30p! 30/50 30/55 mild p!  Hip external rotation 30p!/40p!! 30/45 35/45 mild p!   (Blank rows = not tested)  LE MMT:  MMT Right 02/15/2022 Left 02/15/2022  Right 03/14/2022  Hip flexion 4+/5 5/5 5/5, minor pressure at anterior hip  Hip extension 4+/5 5/5 5/5  Hip abduction 4+/5 5/5 5/5  Hip adduction 5/5 5/5   Hip internal rotation 5/5 5/5   Hip external rotation 4+/5p! 5/5 5/5  Knee flexion 5/5 5/5   Knee extension 5/5 5/5    (Blank rows = not tested)  FUNCTIONAL TESTS:  5xSTS: 9 seconds Squat: WNL, mild pain Rt lunge: WNL, mild pain; Lt lunge: WNL     TODAY'S TREATMENT:  [facility] Adult PT Treatment:                                                DATE: 04/04/2022 Therapeutic Exercise: Alternating mini-squat side slides with GTB around thighs 3x20 Explosive monster walks with GTB around thighs 3x10 Explosive reverse monster walks with GTB around thighs 3x10 15# kettlebell swings into alternating knee drive 5G38 Single-leg Pallof press with mini squat with red sports cord x10 BIL in both hip ER and IR (4 sets total) Red sports cord assisted Nordic hamstring curls on Airex pad 2x6 Standing windmill stretches 2x20 Manual Therapy: N/A Neuromuscular re-ed: N/A Therapeutic Activity: N/A Modalities: N/A Self Care: N/A   [facility] Adult PT Treatment:                                                 DATE: 03/29/2022 Therapeutic Exercise: Mini dragon squat with 15# kettlebell 2x15 BIL Straight-leg deadlift with 45# barbell 2x12 [place] split squat hops with 10# kettlebell in contralateral hand 2x10 BIL Alternating side slides with GTB around thighs 3x10 Manual Therapy: N/A Neuromuscular re-ed: N/A Therapeutic Activity: N/A Modalities: N/A Self Care: N/A   [facility] Adult PT Treatment:                                                DATE: 03/21/2022 Therapeutic Exercise: [place] split squat hops with 10# kettlebell 2x10 Deadlift x10 with 45# barbell, x8 with 65# barbell, x6 with 75# barbell Squat into alternating sumo hip abduction 3x10 BIL SL Pallof press with red sports cord x10 with 5-sec hold BIL on each side Seated piriformis stretch x29mn BIL Manual Therapy: N/A Neuromuscular re-ed: N/A Therapeutic Activity: N/A Modalities: N/A Self Care: N/A      HOME EXERCISE PROGRAM: Access Code: [email or web address]: [email or web address] Date: 10/25/2021 Prepared by: TVanessa [place]  Exercises - Seated Cervical Retraction  - 1 x daily - 7 x weekly - 3 sets - 10 reps - 3-sec hold - Standing Shoulder Row with Anchored Resistance  - 1 x daily - 7 x weekly - 3 sets - 10 reps - 3-sec hold - Low trap slides at wall with lift-off  - 1 x daily - 7 x weekly - 3 sets - 10 reps - 3-sec hold - Shoulder Extension with Resistance  - 1 x daily - 7 x weekly - 3 sets - 10 reps - 3-sec hold  Added 11/16/2021: - Seated Upper Trapezius Stretch  - 1 x daily - 7 x weekly - 2 sets - 1-min hold - Seated Levator Scapulae  Stretch  - 1 x daily - 7 x weekly - 2 sets - 1-min hold  Added 12/03/2021: - Seated Assisted Cervical Rotation with Towel  - 1 x daily - 7 x weekly - 3 sets - 10 reps - Cervical Extension AROM with Strap  - 1 x daily - 7 x weekly - 3 sets - 10 reps  Added 02/15/2022: - Modified Side Plank with Hip Abduction  - 1 x daily - 7 x weekly -  3 sets - 10 reps - Single Leg Bridge  - 1 x daily - 7 x weekly - 3 sets - 10 reps - Single-Leg [place] Deadlift With Kettlebell  - 1 x daily - 7 x weekly - 3 sets - 10 reps - Supine Core Bicycle  - 1 x daily - 7 x weekly - 3 sets - 20 reps - Standard Plank  - 1 x daily - 7 x weekly - 3 sets - 30-60 seconds hold   ASSESSMENT:   CLINICAL IMPRESSION: Pt continues to respond well to progressed hip strengthening and plyometrics. She will continue to benefit from skilled PT to address her primary impairments and return to her prior level of function with less limitation.      OBJECTIVE IMPAIRMENTS Abnormal gait, decreased balance, decreased mobility, difficulty walking, decreased ROM, decreased strength, hypomobility, increased edema, increased muscle spasms, impaired flexibility, improper body mechanics, postural dysfunction, and pain.    ACTIVITY LIMITATIONS carrying, lifting, bending, sitting, standing, squatting, sleeping, stairs, transfers, and locomotion level   PARTICIPATION LIMITATIONS: meal prep, cleaning, laundry, interpersonal relationship, driving, shopping, community activity, occupation, and yard work   PERSONAL FACTORS Past/current experiences, Time since onset of injury/illness/exacerbation, and 3+ comorbidities: See medical hx  are also affecting patient's functional outcome.        GOALS: Goals reviewed with patient? Yes   SHORT TERM GOALS: Target date: 11/22/2021  Pt will report understanding and adherence to initial HEP in order to promote independence in the management of primary impairments. Baseline: HEP provided at eval Goal status: MET Pt reports adherence 12/03/21     LONG TERM GOALS: Target date: 04/19/2022    Pt will achieve a cervical FOTO score of 67% in order to demonstrate improved functional ability as it relates to her primary impairments. Baseline: 59% 12/08/2021: 51% 01/18/2022: 60% Goal status: IN PROGRESS   2.  Pt will achieve BIL global  parascapular strength of 4+/5 or higher in order to promote improved posture and prophylaxis of future mechanical neck pain Baseline: 3/5 global parascapular strength 12/08/2021: See updated MMT chart 01/03/2022: See updated MMT chart Goal status: ACHIEVED   3.  Pt will achieve global cervical AROM WNL and with 0-2/10 pain in order to get dressed with less limitation. Baseline: See AROM chart 11/16/2021: See updated AROM chart 01/03/2022: See updated AROM (4/10 pain with extension) Goal status: IN PROGRESS   4.  Pt will report ability to complete a full work day, including looking down at her computer screen with 0-3/10 pain in order to complete her work duties with less limitation. Baseline: Pt has >6/10 pain at the end of her work day 01/03/2022: Pt reports average daily pain at work of 5-6/10 03/21/2022: 3/10 average pain on a workday Goal status: ACHIEVED   5.  *New 01/03/2022* Pt will report ability to sleep through the night without being waken due to pain.   Baseline: Pt woken 5x per night   01/18/2022: Pt continues to be woken 5x per night 03/21/2022: Pt  reports being able to sleep through the night not limited by pain   Goal status: ACHIEVED   6. *New 02/15/2022* Pt will achieve global Rt hip AROM within 10 degrees of Lt with 0-2/10 pain in order to get dressed with less limitation.   Baseline: See AROM chart 03/14/2022: See updated AROM chart   Goal Status: ACHIEVED   7. *New 02/15/2022* Pt will achieve 45# floor lifts x10 with 0-3/10 pain in order to demonstrate improved functional LE strength in pursuit of improved post-operative outcomes.   Baseline: Pt has pain with unweighted squat   Goal Status: INITIAL       PLAN: PT FREQUENCY: 1x/week   PT DURATION: 8 weeks   PLANNED INTERVENTIONS: Therapeutic exercises, Therapeutic activity, Neuromuscular re-education, Balance training, Gait training, Patient/Family education, Joint mobilization, Stair training, Aquatic Therapy, Dry  Needling, Electrical stimulation, Spinal mobilization, Cryotherapy, Moist heat, Taping, Vasopneumatic device, Traction, Biofeedback, Ionotophoresis 49m/ml Dexamethasone, Manual therapy, and Re-evaluation   PLAN FOR NEXT SESSION: Progress hip strengthening/ pain modulation for improved post-op outcomes (no OMT due to EDS/ labral tear)  YVanessa [place] PT, DPT 04/04/22 5:45 PM

## 2022-04-06 DIAGNOSIS — F411 Generalized anxiety disorder: Secondary | ICD-10-CM | POA: Diagnosis not present

## 2022-04-06 DIAGNOSIS — F3341 Major depressive disorder, recurrent, in partial remission: Secondary | ICD-10-CM | POA: Diagnosis not present

## 2022-04-06 DIAGNOSIS — R45851 Suicidal ideations: Secondary | ICD-10-CM | POA: Diagnosis not present

## 2022-04-06 DIAGNOSIS — Q796 Ehlers-Danlos syndrome, unspecified: Secondary | ICD-10-CM | POA: Diagnosis not present

## 2022-04-09 DIAGNOSIS — F3341 Major depressive disorder, recurrent, in partial remission: Secondary | ICD-10-CM | POA: Diagnosis not present

## 2022-04-09 DIAGNOSIS — G894 Chronic pain syndrome: Secondary | ICD-10-CM | POA: Diagnosis not present

## 2022-04-09 DIAGNOSIS — F5081 Binge eating disorder: Secondary | ICD-10-CM | POA: Diagnosis not present

## 2022-04-09 DIAGNOSIS — F411 Generalized anxiety disorder: Secondary | ICD-10-CM | POA: Diagnosis not present

## 2022-04-11 ENCOUNTER — Ambulatory Visit: Payer: BC Managed Care – PPO

## 2022-04-14 NOTE — Progress Notes (Addendum)
Surgical Instructions    Your procedure is scheduled on Tuesday December 5th.  Report to [facility] Main Entrance "A" at 7:15 A.M., then check in with the Admitting office.  Call this number if you have problems the morning of surgery:  [phone]   If you have any questions prior to your surgery date call [phone]: Open Monday-Friday 8am-4pm If you experience any cold or flu symptoms such as cough, fever, chills, shortness of breath, etc. between now and your scheduled surgery, please notify us at the above number     Remember:  Do not eat after midnight the night before your surgery  You may drink clear liquids until 6:15am the morning of your surgery.   Clear liquids allowed are: Water, Non-Citrus Juices (without pulp), Carbonated Beverages, Clear Tea, Black Coffee ONLY (NO MILK, CREAM OR POWDERED CREAMER of any kind), and Gatorade    Take these medicines the morning of surgery with A SIP OF WATER: Dextromethorphan-buPROPion ER (AUVELITY) 45-105 MG TBCR  Drospirenone (SLYND) 4 MG TABS  finasteride (PROPECIA) 1 MG tablet  loratadine (CLARITIN) 10 MG tablet    IF NEEDED  clonazePAM (KLONOPIN) 1 MG tablet  naratriptan (AMERGE) 2.5 MG tablet  ondansetron (ZOFRAN ODT) 4 MG disintegrating tablet  promethazine (PHENERGAN) 25 MG tablet   As of today, STOP taking any Aspirin (unless otherwise instructed by your surgeon) Aleve, Naproxen, Ibuprofen, Motrin, Advil, Goody's, BC's, all herbal medications, fish oil, and all vitamins.           Do not wear jewelry or makeup. Do not wear lotions, powders, perfumes or deodorant. Do not shave 48 hours prior to surgery.  Do not bring valuables to the hospital. Do not wear nail polish, gel polish, artificial nails, or any other type of covering on natural nails (fingers and toes) If you have artificial nails or gel coating that need to be removed by a nail salon, please have this removed prior to surgery. Artificial nails or gel coating  may interfere with anesthesia's ability to adequately monitor your vital signs.  [facility] is not responsible for any belongings or valuables.    Do NOT Smoke (Tobacco/Vaping)  24 hours prior to your procedure  If you use a CPAP at night, you may bring your mask for your overnight stay.   Contacts, glasses, hearing aids, dentures or partials may not be worn into surgery, please bring cases for these belongings   For patients admitted to the hospital, discharge time will be determined by your treatment team.   Patients discharged the day of surgery will not be allowed to drive home, and someone needs to stay with them for 24 hours.   SURGICAL WAITING ROOM VISITATION Patients having surgery or a procedure may have no more than 2 support people in the waiting area - these visitors may rotate.   Children under the age of 74 must have an adult with them who is not the patient. If the patient needs to stay at the hospital during part of their recovery, the visitor guidelines for inpatient rooms apply. Pre-op nurse will coordinate an appropriate time for 1 support person to accompany patient in pre-op.  This support person may not rotate.   Please refer to [email or web address] for the visitor guidelines for Inpatients (after your surgery is over and you are in a regular room).    Special instructions:    Oral Hygiene is also important to reduce your risk of infection.  Remember - BRUSH YOUR TEETH THE MORNING  OF SURGERY WITH YOUR REGULAR TOOTHPASTE   [place]- Preparing For Surgery  Before surgery, you can play an important role. Because skin is not sterile, your skin needs to be as free of germs as possible. You can reduce the number of germs on your skin by washing with CHG (chlorahexidine gluconate) Soap before surgery.  CHG is an antiseptic cleaner which kills germs and bonds with the skin to continue killing germs even after washing.      Please do not use if you have an allergy to CHG or antibacterial soaps. If your skin becomes reddened/irritated stop using the CHG.  Do not shave (including legs and underarms) for at least 48 hours prior to first CHG shower. It is OK to shave your face.  Please follow these instructions carefully.     Shower the NIGHT BEFORE SURGERY and the MORNING OF SURGERY with CHG Soap.   If you chose to wash your hair, wash your hair first as usual with your normal shampoo. After you shampoo, rinse your hair and body thoroughly to remove the shampoo.  Then [employer] and genitals (private parts) with your normal soap and rinse thoroughly to remove soap.  After that Use CHG Soap as you would any other liquid soap. You can apply CHG directly to the skin and wash gently with a scrungie or a clean washcloth.   Apply the CHG Soap to your body ONLY FROM THE NECK DOWN.  Do not use on open wounds or open sores. Avoid contact with your eyes, ears, mouth and genitals (private parts). Wash Face and genitals (private parts)  with your normal soap.   Wash thoroughly, paying special attention to the area where your surgery will be performed.  Thoroughly rinse your body with warm water from the neck down.  DO NOT shower/wash with your normal soap after using and rinsing off the CHG Soap.  Pat yourself dry with a CLEAN TOWEL.  Wear CLEAN PAJAMAS to bed the night before surgery  Place CLEAN SHEETS on your bed the night before your surgery  DO NOT SLEEP WITH PETS.   Day of Surgery:  Take a shower with CHG soap. Wear Clean/Comfortable clothing the morning of surgery Do not apply any deodorants/lotions.   Remember to brush your teeth WITH YOUR REGULAR TOOTHPASTE.    If you received a COVID test during your pre-op visit, it is requested that you wear a mask when out in public, stay away from anyone that may not be feeling well, and notify your surgeon if you develop symptoms. If you have been in contact  with anyone that has tested positive in the last 10 days, please notify your surgeon.    Please read over the following fact sheets that you were given.

## 2022-04-17 ENCOUNTER — Ambulatory Visit (HOSPITAL_BASED_OUTPATIENT_CLINIC_OR_DEPARTMENT_OTHER): Payer: Self-pay | Admitting: Orthopaedic Surgery

## 2022-04-17 ENCOUNTER — Other Ambulatory Visit: Payer: Self-pay

## 2022-04-17 ENCOUNTER — Encounter (HOSPITAL_COMMUNITY)
Admission: RE | Admit: 2022-04-17 | Discharge: 2022-04-17 | Disposition: A | Discharge status: Home / Self Care | Payer: BC Managed Care – PPO | Source: Ambulatory Visit | Attending: Orthopaedic Surgery | Admitting: Orthopaedic Surgery

## 2022-04-17 ENCOUNTER — Encounter (HOSPITAL_COMMUNITY): Payer: Self-pay

## 2022-04-17 DIAGNOSIS — S73191D Other sprain of right hip, subsequent encounter: Secondary | ICD-10-CM

## 2022-04-17 DIAGNOSIS — Z01818 Encounter for other preprocedural examination: Secondary | ICD-10-CM | POA: Diagnosis not present

## 2022-04-17 HISTORY — DX: Nausea with vomiting, unspecified: R11.2

## 2022-04-17 HISTORY — DX: Post-traumatic stress disorder, unspecified: F43.10

## 2022-04-17 HISTORY — DX: Other specified postprocedural states: Z98.890

## 2022-04-17 HISTORY — DX: Personal history of other diseases of the circulatory system: Z86.79

## 2022-04-17 NOTE — Progress Notes (Signed)
Surgical Instructions    Your procedure is scheduled on Tuesday December 5th.  Report to [facility] Main Entrance "A" at 7:15 A.M., then check in with the Admitting office.  Call this number if you have problems the morning of surgery:  [phone]   If you have any questions prior to your surgery date call [phone]: Open Monday-Friday 8am-4pm If you experience any cold or flu symptoms such as cough, fever, chills, shortness of breath, etc. between now and your scheduled surgery, please notify us at the above number     Remember:  Do not eat after midnight the night before your surgery  You may drink clear liquids until 6:15am the morning of your surgery.   Clear liquids allowed are: Water, Non-Citrus Juices (without pulp), Carbonated Beverages, Clear Tea, Black Coffee ONLY (NO MILK, CREAM OR POWDERED CREAMER of any kind), and Gatorade  Patient Instructions  The night before surgery:  No food after midnight. ONLY clear liquids after midnight  The day of surgery (if you do NOT have diabetes):  Drink ONE (1) Pre-Surgery Clear Ensure by 6:15AM the morning of surgery. Drink in one sitting. Do not sip.  This drink was given to you during your hospital  pre-op appointment visit.  Nothing else to drink after completing the  Pre-Surgery Clear Ensure.      Take these medicines the morning of surgery with A SIP OF WATER: Dextromethorphan-buPROPion ER (AUVELITY) 45-105 MG TBCR  Drospirenone (SLYND) 4 MG TABS  finasteride (PROPECIA) 1 MG tablet  loratadine (CLARITIN) 10 MG tablet    IF NEEDED  clonazePAM (KLONOPIN) 1 MG tablet  naratriptan (AMERGE) 2.5 MG tablet  ondansetron (ZOFRAN ODT) 4 MG disintegrating tablet  promethazine (PHENERGAN) 25 MG tablet   As of today, STOP taking any Aspirin (unless otherwise instructed by your surgeon) Aleve, Naproxen, Ibuprofen, Motrin, Advil, Goody's, BC's, all herbal medications, fish oil, and all vitamins.         [facility] is not  responsible for any belongings or valuables.    Do NOT Smoke (Tobacco/Vaping)  24 hours prior to your procedure  If you use a CPAP at night, you may bring your mask for your overnight stay.   Contacts, glasses, hearing aids, dentures or partials may not be worn into surgery, please bring cases for these belongings   For patients admitted to the hospital, discharge time will be determined by your treatment team.   Patients discharged the day of surgery will not be allowed to drive home, and someone needs to stay with them for 24 hours.   SURGICAL WAITING ROOM VISITATION Patients having surgery or a procedure may have no more than 2 support people in the waiting area - these visitors may rotate.   Children under the age of 39 must have an adult with them who is not the patient. If the patient needs to stay at the hospital during part of their recovery, the visitor guidelines for inpatient rooms apply. Pre-op nurse will coordinate an appropriate time for 1 support person to accompany patient in pre-op.  This support person may not rotate.   Please refer to [email or web address] for the visitor guidelines for Inpatients (after your surgery is over and you are in a regular room).    Special instructions:    Oral Hygiene is also important to reduce your risk of infection.  Remember - BRUSH YOUR TEETH THE MORNING OF SURGERY WITH YOUR REGULAR TOOTHPASTE   [place]- Preparing For Surgery  Before surgery, you  can play an important role. Because skin is not sterile, your skin needs to be as free of germs as possible. You can reduce the number of germs on your skin by washing with CHG (chlorahexidine gluconate) Soap before surgery.  CHG is an antiseptic cleaner which kills germs and bonds with the skin to continue killing germs even after washing.     Please do not use if you have an allergy to CHG or antibacterial soaps. If your skin becomes  reddened/irritated stop using the CHG.  Do not shave (including legs and underarms) for at least 48 hours prior to first CHG shower. It is OK to shave your face.  Please follow these instructions carefully.     Shower the NIGHT BEFORE SURGERY and the MORNING OF SURGERY with CHG Soap.   If you chose to wash your hair, wash your hair first as usual with your normal shampoo. After you shampoo, rinse your hair and body thoroughly to remove the shampoo.  Then [employer] and genitals (private parts) with your normal soap and rinse thoroughly to remove soap.  After that Use CHG Soap as you would any other liquid soap. You can apply CHG directly to the skin and wash gently with a scrungie or a clean washcloth.   Apply the CHG Soap to your body ONLY FROM THE NECK DOWN.  Do not use on open wounds or open sores. Avoid contact with your eyes, ears, mouth and genitals (private parts). Wash Face and genitals (private parts)  with your normal soap.   Wash thoroughly, paying special attention to the area where your surgery will be performed.  Thoroughly rinse your body with warm water from the neck down.  DO NOT shower/wash with your normal soap after using and rinsing off the CHG Soap.  Pat yourself dry with a CLEAN TOWEL.  Wear CLEAN PAJAMAS to bed the night before surgery  Place CLEAN SHEETS on your bed the night before your surgery  DO NOT SLEEP WITH PETS.   Day of Surgery:  Take a shower with CHG soap. Wear Clean/Comfortable clothing the morning of surgery Do not wear jewelry or makeup. Do not wear lotions, powders, perfumes or deodorant. Do not shave 48 hours prior to surgery.  Do not bring valuables to the hospital. Do not wear nail polish, gel polish, artificial nails, or any other type of covering on natural nails (fingers and toes) If you have artificial nails or gel coating that need to be removed by a nail salon, please have this removed prior to surgery. Artificial nails or gel  coating may interfere with anesthesia's ability to adequately monitor your vital signs.  Remember to brush your teeth WITH YOUR REGULAR TOOTHPASTE.    If you received a COVID test during your pre-op visit, it is requested that you wear a mask when out in public, stay away from anyone that may not be feeling well, and notify your surgeon if you develop symptoms. If you have been in contact with anyone that has tested positive in the last 10 days, please notify your surgeon.    Please read over the following fact sheets that you were given.

## 2022-04-17 NOTE — Progress Notes (Signed)
PCP - Cathe Mons, MD  Cardiologist - denies  PPM/ICD - denies   Chest x-ray - N/A EKG - 04/17/2022 - pt reports daily palpitations several times a day. No other symptoms.  Stress Test - denies ECHO - denies Cardiac Cath - denies  Sleep Study - denies  Fasting Blood Sugar - denies  Last dose of GLP1 agonist-  denies   Blood Thinner Instructions: N/A Aspirin Instructions: N/A  ERAS Protcol - ERAS + ensure   COVID TEST- N/A   Anesthesia review: Per [facility] Standard, do EKG today d/t palpitations, pt recently postive for MONO  Pt states she has had thoughts of wanting to end her life within the last month. She states that she has not planned to do anything to end her life. Pt currently with no suicidal ideation. Pt stats she sees a doctor for this and is on medications which are helping. Pt states she does not need to talk to anyone about this today. Social work contacted and nothing further needed today.   Patient denies shortness of breath, fever, cough and chest pain at PAT appointment   All instructions explained to the patient, with a verbal understanding of the material. Patient agrees to go over the instructions while at home for a better understanding. Patient also instructed to self quarantine after being tested for COVID-19. The opportunity to ask questions was provided.

## 2022-04-18 ENCOUNTER — Telehealth: Payer: Self-pay

## 2022-04-18 NOTE — Telephone Encounter (Signed)
LMOM of Dr. Serena Croissant response

## 2022-04-18 NOTE — Telephone Encounter (Signed)
Patient called wanting to know if her insurance approves for her to get Botox injections, will this interfere with her right hip surgery scheduled on 04/25/2022.  CB# [phone].  Please advise.  Thank you.

## 2022-04-18 NOTE — Progress Notes (Signed)
Anesthesia Chart Review:   Case: [id number] Date/Time: 04/25/22 0900   Procedure: RIGHT HIP ARTHROSCOPY WITH LABRAL RECONSTRUCTION, ILIOTIBIAL BAND ALLOGRAFT, PSOAS RELEASE (Right: Hip)   Anesthesia type: Regional   Pre-op diagnosis: RIGHT HIP LABRAL TEAR   Location: [facility] OR ROOM 04 / [place] OR   Surgeons: Vanetta Mulders, MD       DISCUSSION: Patient is a 26 year old female scheduled for the above procedure.   History includes former smoker (quit 05/23/19), post-operative N/V, asthma, fibromyalgia, migraines, GERD, anemia (history if IV iron), ADHD, Hypermobile Ehlers-Danlos Syndrome, PTSD, palpitations (daily, present sent ~ 2016), hypotension (history of midodrine), gastroparesis, cholecystectomy (05/09/21), right hip labral tear (s/p repair, right hip pincer debridement 03/02/21). + POC Monospot 02/16/22.   She has longtime history of daily palpitations. She can can get occasional dizziness with sudden movements, which is unrelated to her palpitations, but no syncope. She saw cardiologist Dr. Johney Frame on 07/08/20. Notes symptoms since ~ 2016 and concerning for dysautonomia. Orthostatic vitals were negative and no evidence of orthostatic tachycardia to suggest POTS. Symptomatic management recommended including compression stocking/abdominal binders, increase fluids and salt intake, graduated exercise program. She had a normal echo in 06/2020. 3-7 day event monitor is 06/2000 showed no SVT, no A. fib, no VT, no long pauses 3 seconds or more. 04/17/22 EKG showed NSR.  Discussed + Monopot test on 02/16/22 with anesthesiologist Dr. Fransisco Beau. It has now been > 2 month ago. She had PCP follow-up who did check her LFTs on 03/09/22, so I did not repeat since they were normal. Creatinine 0.75. Her CBC on 03/28/22 showed WBC 3.0, H/H 13.4/39.2, PLT 183.   Anesthesia team to evaluate on the day of surgery. 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 No reported difficult intubations, 7.0 ETT placed using Mac and 3 03/02/21. She is for  urine pregnancy test on arrival.   VS: LMP 01/30/2022  Wt Readings from Last 3 Encounters:  03/13/22 47.9 kg  02/09/22 48.5 kg  02/01/22 49.4 kg   BP Readings from Last 3 Encounters:  03/13/22 126/88  02/16/22 106/66  02/09/22 112/69   Pulse Readings from Last 3 Encounters:  03/13/22 88  02/16/22 75  02/09/22 91     PROVIDERS: Josephina Shih, MD is PCP  Gwyndolyn Kaufman, MD is cardiologist. She has also seen Marina Goodell, MD with [facility].  Mila Merry, MD is neurologist ([facility]) Marcy Panning, MD is HEM ([facility]) Christell Faith, MD is vascular surgeon ([facility]) Lindaann Slough, MD is endocrinologist ([facility]) Scherrie November, MD is GI ([facility]) Evie Lacks, MD is pulmonologist [facility])   LABS: She had a CBC on 04/07/22 showing WBC 3.0, hemoglobin 13.4, hematocrit 39.2, platelet count 183. She had  non-reactive HIV, HCV, and RPR tests on 03/22/22. ON 03/09/22 showe had CMP shwoing sodium 140, potassium 3.6, BUN 12, creatinine 0.75, glucose 102, albumin 4.8, calcium 9.8, total bilirubin 0.3, alkaline phosphatase 41, AST 13, ALT 16.  She had a positive Monospot test on 02/16/22 and EBV virus profile sne on 03/09/22 and showed elevated EBV, VCA IgG and elevated EBV Nuclear Antigen Ab IgG.     IMAGES: MRI Right Hip 01/30/22: IMPRESSION: Postsurgical changes of prior labral repair with findings of recurrent full-thickness anterosuperior labral tear.  MRI Brain 09/13/21 ([facility]): MPRESSION:  Unremarkable MRI of the brain.   CT soft tissue neck 06/25/21 ([facility]): IMPRESSION:  Unremarkable neck CT. No cervical lymphadenopathy.   MRI L-spine 02/13/21: IMPRESSION: Normal MRI of the lumbar spine. No findings to explain patient's  symptoms identified.   EKG: 04/17/22 (EKG updated due to known daily palpitations): Normal sinus rhythm Normal ECG When compared with ECG of 02-Mar-2021 10:55, PREVIOUS ECG IS PRESENT Confirmed by Croitoru, Mihai [phone]) on 04/17/2022 7:01:01  PM   CV: Echo 07/09/20: IMPRESSIONS   1. Left ventricular ejection fraction, by estimation, is 60 to 65%. The  left ventricle has normal function. The left ventricle has no regional  wall motion abnormalities. Left ventricular diastolic parameters were  normal.   2. Right ventricular systolic function is normal. The right ventricular  size is normal.   3. The mitral valve is normal in structure. No evidence of mitral valve  regurgitation. No evidence of mitral stenosis.   4. The aortic valve is normal in structure. Aortic valve regurgitation is  not visualized. No aortic stenosis is present.  - Comparison(s): No prior Echocardiogram.  - Conclusion(s)/Recommendation(s): Normal biventricular function without  evidence of hemodynamically significant valvular heart disease.   3-7 Day Long term Holter monitor 06/23/20 ([facility]): IMPRESSION: - Patient remained in sinus rhythm with minimum HR 49 at 6:20 AM on day 4, max HR 155 at 4:10 PM on day 3.  Average HR 87.  Sinus arrhythmia noted at 4:36 AM on February 5 with heart rate about 70.  - Patient remained bradycardic 2.7%  of total time.  - Patient remained tachycardic 20.5% of total time.  - Only 5 PVCs noted.  - Only 175 supraventricular ectopic beats noted.  1 couplet of PACs noted.  1 triplet of PACs noted at 10:44 PM on February 4 with heart rate 1 1 bpm.   - 20 patient triggered episodes were recorded.  During those episodes strips revealed sinus rhythm with heart rate 76-120 and one episode with heart rate 129 and one episode 134 bpm and one episode 148 bpm with no significant arrhythmias noted.  - The longest R to R interval 1.23 seconds at 6:20 AM on February 5.  - No SVT, no A. fib, no VT, no long pauses 3 seconds or more noted.    Past Medical History:  Diagnosis Date   Acute recurrent pansinusitis 10/21/2019   ADHD (attention deficit hyperactivity disorder)    inattentive   Anemia    had iron infusions in Fall of 2023    Anxiety    Asthma    Bronchitis    Cat allergy due to both airborne and skin contact 05/01/2016   Chronic sore throat 05/01/2016   COVID 2020   Depression    Fibromyalgia    pt denies   Gastroparesis    GERD (gastroesophageal reflux disease)    History of hypotension    taken midodrine in the past- Spring of 2022   Hypermobile Ehlers-Danlos syndrome    Irregular heart rate    Keratosis pilaris    Migraine    PONV (postoperative nausea and vomiting)    "I can get really cold"   PTSD (post-traumatic stress disorder)    Seasonal allergic rhinitis 03/11/2014    Past Surgical History:  Procedure Laterality Date   CHOLECYSTECTOMY  05/09/2021   ESOPHAGOGASTRODUODENOSCOPY ENDOSCOPY     03/2020   LABRAL REPAIR Right 03/02/2021   Procedure: RIGHT HIP ARTHROSCOPY WITH LABRAL REPAIR AND PINCE DEBRIDEMENT;  Surgeon: Vanetta Mulders, MD;  Location: [facility];  Service: Orthopedics;  Laterality: Right;   SMART PILL PROCEDURE     12/2020   TYMPANOSTOMY TUBE PLACEMENT Bilateral    WISDOM TOOTH EXTRACTION      MEDICATIONS:  clobetasol (TEMOVATE) 0.05 % external solution   clonazePAM (KLONOPIN) 1 MG tablet   Dextromethorphan-buPROPion ER (AUVELITY) 45-105 MG TBCR   Drospirenone (SLYND) 4 MG TABS   finasteride (PROPECIA) 1 MG tablet   Galcanezumab-gnlm (EMGALITY) 120 MG/ML SOAJ   loratadine (CLARITIN) 10 MG tablet   naratriptan (AMERGE) 2.5 MG tablet   Omega-3 Fatty Acids (FISH OIL PO)   ondansetron (ZOFRAN ODT) 4 MG disintegrating tablet   Pediatric Multiple Vitamins (PEDIATRIC MULTIVITAMIN) chewable tablet   promethazine (PHENERGAN) 25 MG tablet   tazarotene (TAZORAC) 0.05 % cream   tranexamic acid (LYSTEDA) 650 MG TABS tablet   triamcinolone cream (KENALOG) 0.1 %   No current facility-administered medications for this encounter.     Myra Gianotti, PA-C Surgical Short Stay/Anesthesiology [facility] Phone [phone] [facility] Phone [phone] 04/19/2022 1:20 PM

## 2022-04-19 NOTE — Anesthesia Preprocedure Evaluation (Addendum)
Anesthesia Evaluation  Patient identified by MRN, date of birth, ID band Patient awake    Reviewed: Allergy & Precautions, NPO status , Patient's Chart, lab work & pertinent test results  History of Anesthesia Complications Negative for: history of anesthetic complications  Airway Mallampati: II  TM Distance: >3 FB Neck ROM: Full    Dental  (+) Dental Advisory Given, Teeth Intact   Pulmonary asthma , Patient abstained from smoking., former smoker   Pulmonary exam normal        Cardiovascular negative cardio ROS Normal cardiovascular exam  Echo 07/09/20: Normal biventricular function without evidence of hemodynamically significant valvular heart disease.     Neuro/Psych  Headaches  Anxiety Depression       GI/Hepatic Neg liver ROS,GERD  ,,  Endo/Other  negative endocrine ROS    Renal/GU negative Renal ROS  negative genitourinary   Musculoskeletal negative musculoskeletal ROS (+)    Abdominal   Peds  Hematology negative hematology ROS (+)   Anesthesia Other Findings Ehlers-Danlos syndrome  Reproductive/Obstetrics                             Anesthesia Physical Anesthesia Plan  ASA: 2  Anesthesia Plan: General   Post-op Pain Management: Tylenol PO (pre-op)* and Toradol IV (intra-op)*   Induction: Intravenous  PONV Risk Score and Plan: 3 and Ondansetron, Dexamethasone, Treatment may vary due to age or medical condition and Midazolam  Airway Management Planned: Oral ETT  Additional Equipment: None  Intra-op Plan:   Post-operative Plan: Extubation in OR  Informed Consent: I have reviewed the patients History and Physical, chart, labs and discussed the procedure including the risks, benefits and alternatives for the proposed anesthesia with the patient or authorized representative who has indicated his/her understanding and acceptance.     Dental advisory given  Plan Discussed  with:   Anesthesia Plan Comments: (PAT note written 04/19/2022 by Shonna Chock, PA-C.  )       Anesthesia Quick Evaluation

## 2022-04-20 ENCOUNTER — Encounter: Payer: BC Managed Care – PPO | Admitting: Physical Medicine and Rehabilitation

## 2022-04-20 DIAGNOSIS — F411 Generalized anxiety disorder: Secondary | ICD-10-CM | POA: Diagnosis not present

## 2022-04-23 DIAGNOSIS — F411 Generalized anxiety disorder: Secondary | ICD-10-CM | POA: Diagnosis not present

## 2022-04-23 DIAGNOSIS — G894 Chronic pain syndrome: Secondary | ICD-10-CM | POA: Diagnosis not present

## 2022-04-23 DIAGNOSIS — F3341 Major depressive disorder, recurrent, in partial remission: Secondary | ICD-10-CM | POA: Diagnosis not present

## 2022-04-23 DIAGNOSIS — F5081 Binge eating disorder: Secondary | ICD-10-CM | POA: Diagnosis not present

## 2022-04-25 ENCOUNTER — Ambulatory Visit (HOSPITAL_COMMUNITY)
Admission: RE | Admit: 2022-04-25 | Discharge: 2022-04-25 | Disposition: A | Discharge status: Home / Self Care | Payer: BC Managed Care – PPO | Attending: Orthopaedic Surgery | Admitting: Orthopaedic Surgery

## 2022-04-25 ENCOUNTER — Ambulatory Visit (HOSPITAL_COMMUNITY): Payer: BC Managed Care – PPO | Admitting: Anesthesiology

## 2022-04-25 ENCOUNTER — Other Ambulatory Visit: Payer: Self-pay

## 2022-04-25 ENCOUNTER — Ambulatory Visit (HOSPITAL_COMMUNITY): Payer: BC Managed Care – PPO

## 2022-04-25 ENCOUNTER — Encounter (HOSPITAL_COMMUNITY)
Admission: RE | Disposition: A | Discharge status: Home / Self Care | Payer: Self-pay | Source: Home / Self Care | Attending: Orthopaedic Surgery

## 2022-04-25 ENCOUNTER — Encounter (HOSPITAL_COMMUNITY): Payer: Self-pay | Admitting: Orthopaedic Surgery

## 2022-04-25 ENCOUNTER — Ambulatory Visit (HOSPITAL_COMMUNITY): Payer: BC Managed Care – PPO | Admitting: Vascular Surgery

## 2022-04-25 DIAGNOSIS — Q796 Ehlers-Danlos syndrome, unspecified: Secondary | ICD-10-CM | POA: Diagnosis not present

## 2022-04-25 DIAGNOSIS — Z471 Aftercare following joint replacement surgery: Secondary | ICD-10-CM | POA: Diagnosis not present

## 2022-04-25 DIAGNOSIS — M21851 Other specified acquired deformities of right thigh: Secondary | ICD-10-CM | POA: Diagnosis present

## 2022-04-25 DIAGNOSIS — M25551 Pain in right hip: Secondary | ICD-10-CM | POA: Insufficient documentation

## 2022-04-25 DIAGNOSIS — S73191A Other sprain of right hip, initial encounter: Secondary | ICD-10-CM | POA: Diagnosis not present

## 2022-04-25 DIAGNOSIS — Z87891 Personal history of nicotine dependence: Secondary | ICD-10-CM | POA: Insufficient documentation

## 2022-04-25 DIAGNOSIS — S73191D Other sprain of right hip, subsequent encounter: Secondary | ICD-10-CM

## 2022-04-25 DIAGNOSIS — Z96641 Presence of right artificial hip joint: Secondary | ICD-10-CM | POA: Diagnosis not present

## 2022-04-25 DIAGNOSIS — M25351 Other instability, right hip: Secondary | ICD-10-CM | POA: Insufficient documentation

## 2022-04-25 DIAGNOSIS — G8918 Other acute postprocedural pain: Secondary | ICD-10-CM | POA: Diagnosis not present

## 2022-04-25 HISTORY — PX: HIP ARTHROSCOPY: SHX668

## 2022-04-25 LAB — POCT PREGNANCY, URINE: Preg Test, Ur: NEGATIVE

## 2022-04-25 SURGERY — ARTHROSCOPY HIP
Anesthesia: General | Site: Hip | Laterality: Right

## 2022-04-25 MED ORDER — KETOROLAC TROMETHAMINE 30 MG/ML IJ SOLN
INTRAMUSCULAR | Status: AC
  Filled 2022-04-25: qty 1

## 2022-04-25 MED ORDER — CEFAZOLIN SODIUM-DEXTROSE 2-4 GM/100ML-% IV SOLN
2.0000 g | INTRAVENOUS | Status: AC
  Administered 2022-04-25: 2 g via INTRAVENOUS
  Filled 2022-04-25: qty 100

## 2022-04-25 MED ORDER — GABAPENTIN 300 MG PO CAPS
300.0000 mg | ORAL_CAPSULE | Freq: Once | ORAL | Status: AC
  Administered 2022-04-25: 300 mg via ORAL
  Filled 2022-04-25: qty 1

## 2022-04-25 MED ORDER — OXYCODONE HCL 5 MG/5ML PO SOLN: 5.0000 mg | Freq: Once | ORAL | Status: AC | PRN

## 2022-04-25 MED ORDER — DEXMEDETOMIDINE HCL IN NACL 80 MCG/20ML IV SOLN
INTRAVENOUS | Status: AC
  Filled 2022-04-25: qty 20

## 2022-04-25 MED ORDER — DEXMEDETOMIDINE HCL IN NACL 80 MCG/20ML IV SOLN
INTRAVENOUS | Status: DC | PRN
  Administered 2022-04-25: 10 ug via BUCCAL

## 2022-04-25 MED ORDER — PROPOFOL 10 MG/ML IV BOLUS
INTRAVENOUS | Status: DC | PRN
  Administered 2022-04-25: 160 mg via INTRAVENOUS

## 2022-04-25 MED ORDER — FENTANYL CITRATE (PF) 100 MCG/2ML IJ SOLN
INTRAMUSCULAR | Status: AC
  Administered 2022-04-25: 100 ug via INTRAVENOUS
  Filled 2022-04-25: qty 2

## 2022-04-25 MED ORDER — OXYCODONE HCL 5 MG PO TABS
ORAL_TABLET | ORAL | Status: AC
  Filled 2022-04-25: qty 1

## 2022-04-25 MED ORDER — EPINEPHRINE PF 1 MG/ML IJ SOLN
INTRAMUSCULAR | Status: AC
  Filled 2022-04-25: qty 2

## 2022-04-25 MED ORDER — AMISULPRIDE (ANTIEMETIC) 5 MG/2ML IV SOLN: 10.0000 mg | Freq: Once | INTRAVENOUS | Status: DC | PRN

## 2022-04-25 MED ORDER — PROPOFOL 1000 MG/100ML IV EMUL
INTRAVENOUS | Status: AC
  Filled 2022-04-25: qty 100

## 2022-04-25 MED ORDER — ONDANSETRON HCL 4 MG/2ML IJ SOLN: 4.0000 mg | Freq: Once | INTRAMUSCULAR | Status: DC | PRN

## 2022-04-25 MED ORDER — FENTANYL CITRATE (PF) 250 MCG/5ML IJ SOLN
INTRAMUSCULAR | Status: AC
  Filled 2022-04-25: qty 5

## 2022-04-25 MED ORDER — DEXAMETHASONE SODIUM PHOSPHATE 10 MG/ML IJ SOLN
INTRAMUSCULAR | Status: AC
  Filled 2022-04-25: qty 1

## 2022-04-25 MED ORDER — OXYCODONE HCL 5 MG PO TABS
5.0000 mg | ORAL_TABLET | Freq: Once | ORAL | Status: AC | PRN
  Administered 2022-04-25: 5 mg via ORAL

## 2022-04-25 MED ORDER — 0.9 % SODIUM CHLORIDE (POUR BTL) OPTIME
TOPICAL | Status: DC | PRN
  Administered 2022-04-25: 1000 mL

## 2022-04-25 MED ORDER — SODIUM CHLORIDE 0.9 % IR SOLN
Status: DC | PRN
  Administered 2022-04-25 (×8): 3000 mL

## 2022-04-25 MED ORDER — PROPOFOL 10 MG/ML IV BOLUS
INTRAVENOUS | Status: AC
  Filled 2022-04-25: qty 20

## 2022-04-25 MED ORDER — TRANEXAMIC ACID-NACL 1000-0.7 MG/100ML-% IV SOLN
1000.0000 mg | INTRAVENOUS | Status: AC
  Administered 2022-04-25: 1000 mg via INTRAVENOUS
  Filled 2022-04-25: qty 100

## 2022-04-25 MED ORDER — ROCURONIUM BROMIDE 10 MG/ML (PF) SYRINGE
PREFILLED_SYRINGE | INTRAVENOUS | Status: DC | PRN
  Administered 2022-04-25: 30 mg via INTRAVENOUS
  Administered 2022-04-25: 15 mg via INTRAVENOUS
  Administered 2022-04-25: 30 mg via INTRAVENOUS
  Administered 2022-04-25: 15 mg via INTRAVENOUS
  Administered 2022-04-25 (×2): 20 mg via INTRAVENOUS
  Administered 2022-04-25: 50 mg via INTRAVENOUS

## 2022-04-25 MED ORDER — SUGAMMADEX SODIUM 200 MG/2ML IV SOLN
INTRAVENOUS | Status: DC | PRN
  Administered 2022-04-25: 192.4 mg via INTRAVENOUS

## 2022-04-25 MED ORDER — ACETAMINOPHEN 500 MG PO TABS: 1000.0000 mg | ORAL_TABLET | Freq: Once | ORAL | Status: AC

## 2022-04-25 MED ORDER — ROCURONIUM BROMIDE 10 MG/ML (PF) SYRINGE
PREFILLED_SYRINGE | INTRAVENOUS | Status: AC
  Filled 2022-04-25: qty 10

## 2022-04-25 MED ORDER — ACETAMINOPHEN 500 MG PO TABS
1000.0000 mg | ORAL_TABLET | Freq: Once | ORAL | Status: AC
  Administered 2022-04-25: 1000 mg via ORAL
  Filled 2022-04-25: qty 2

## 2022-04-25 MED ORDER — HYDROMORPHONE HCL 1 MG/ML IJ SOLN: 0.2500 mg | INTRAMUSCULAR | Status: DC | PRN

## 2022-04-25 MED ORDER — ROPIVACAINE HCL 5 MG/ML IJ SOLN
INTRAMUSCULAR | Status: DC | PRN
  Administered 2022-04-25: 15 mL via PERINEURAL

## 2022-04-25 MED ORDER — MIDAZOLAM HCL 2 MG/2ML IJ SOLN: 2.0000 mg | Freq: Once | INTRAMUSCULAR | Status: AC

## 2022-04-25 MED ORDER — FENTANYL CITRATE (PF) 100 MCG/2ML IJ SOLN: 100.0000 ug | Freq: Once | INTRAMUSCULAR | Status: AC

## 2022-04-25 MED ORDER — SCOPOLAMINE 1 MG/3DAYS TD PT72
MEDICATED_PATCH | TRANSDERMAL | Status: AC
  Filled 2022-04-25: qty 1

## 2022-04-25 MED ORDER — MIDAZOLAM HCL 2 MG/2ML IJ SOLN
INTRAMUSCULAR | Status: AC
  Administered 2022-04-25: 2 mg via INTRAVENOUS
  Filled 2022-04-25: qty 2

## 2022-04-25 MED ORDER — ASPIRIN 325 MG PO TBEC: 325.0000 mg | DELAYED_RELEASE_TABLET | Freq: Every day | ORAL | 0 refills | Status: DC

## 2022-04-25 MED ORDER — PROPOFOL 500 MG/50ML IV EMUL
INTRAVENOUS | Status: DC | PRN
  Administered 2022-04-25: 150 ug/kg/min via INTRAVENOUS

## 2022-04-25 MED ORDER — SODIUM CHLORIDE 0.9 % IR SOLN
Status: DC | PRN
  Administered 2022-04-25 (×2): 3001 mL

## 2022-04-25 MED ORDER — LACTATED RINGERS IV SOLN: INTRAVENOUS | Status: DC

## 2022-04-25 MED ORDER — ACETAMINOPHEN 500 MG PO TABS: 500.0000 mg | ORAL_TABLET | Freq: Three times a day (TID) | ORAL | 0 refills | Status: AC

## 2022-04-25 MED ORDER — ORAL CARE MOUTH RINSE: 15.0000 mL | Freq: Once | OROMUCOSAL | Status: AC

## 2022-04-25 MED ORDER — CHLORHEXIDINE GLUCONATE 0.12 % MT SOLN
15.0000 mL | Freq: Once | OROMUCOSAL | Status: AC
  Administered 2022-04-25: 15 mL via OROMUCOSAL

## 2022-04-25 MED ORDER — OXYCODONE HCL 5 MG/5ML PO SOLN: 5.0000 mg | Freq: Four times a day (QID) | ORAL | 0 refills | Status: AC | PRN

## 2022-04-25 MED ORDER — ONDANSETRON HCL 4 MG/2ML IJ SOLN
INTRAMUSCULAR | Status: AC
  Filled 2022-04-25: qty 2

## 2022-04-25 MED ORDER — ONDANSETRON HCL 4 MG/2ML IJ SOLN
INTRAMUSCULAR | Status: DC | PRN
  Administered 2022-04-25: 4 mg via INTRAVENOUS

## 2022-04-25 MED ORDER — SCOPOLAMINE 1 MG/3DAYS TD PT72
1.0000 | MEDICATED_PATCH | TRANSDERMAL | Status: DC
  Administered 2022-04-25: 1.5 mg via TRANSDERMAL

## 2022-04-25 MED ORDER — LIDOCAINE 2% (20 MG/ML) 5 ML SYRINGE
INTRAMUSCULAR | Status: AC
  Filled 2022-04-25: qty 5

## 2022-04-25 MED ORDER — FENTANYL CITRATE (PF) 250 MCG/5ML IJ SOLN
INTRAMUSCULAR | Status: DC | PRN
  Administered 2022-04-25 (×3): 25 ug via INTRAVENOUS

## 2022-04-25 MED ORDER — DEXAMETHASONE SODIUM PHOSPHATE 10 MG/ML IJ SOLN
INTRAMUSCULAR | Status: DC | PRN
  Administered 2022-04-25: 5 mg via INTRAVENOUS

## 2022-04-25 MED ORDER — MIDAZOLAM HCL 2 MG/2ML IJ SOLN
INTRAMUSCULAR | Status: AC
  Filled 2022-04-25: qty 2

## 2022-04-25 MED ORDER — KETOROLAC TROMETHAMINE 15 MG/ML IJ SOLN
INTRAMUSCULAR | Status: DC | PRN
  Administered 2022-04-25: 30 mg via INTRAVENOUS

## 2022-04-25 SURGICAL SUPPLY — 54 items
ANCH SUT 1.8 SLF BUNCH FBRTK (Anchor) ×6 IMPLANT
ANCHOR SUT FIBERTAK 1.8 MT KL (Anchor) IMPLANT
APL PRP STRL LF DISP 70% ISPRP (MISCELLANEOUS) ×1
BAG COUNTER SPONGE SURGICOUNT (BAG) IMPLANT
BAG SPNG CNTER NS LX DISP (BAG)
BLADE CAPSULECUT NH 4 DISP (BLADE) IMPLANT
BLADE SURG 11 STRL SS (BLADE) ×2 IMPLANT
BUR ROUND HI FLUTE 8 4X19 (BURR) ×2 IMPLANT
BURR ROUND HI FLUTE 8 4X19 (BURR) ×2
CANNULA ARTHR TRIM-IT 8.25 (CANNULA) IMPLANT
CHLORAPREP W/TINT 26 (MISCELLANEOUS) ×2 IMPLANT
DISSECTOR 4.2MMX19CM CVD HL (ORTHOPEDIC DISPOSABLE SUPPLIES) IMPLANT
DISSECTOR 4.2MMX19CM HL (MISCELLANEOUS) ×2 IMPLANT
DRAPE C-ARM 42X72 X-RAY (DRAPES) ×2 IMPLANT
DRAPE STERI IOBAN 125X83 (DRAPES) ×2 IMPLANT
DRAPE U-SHAPE 47X51 STRL (DRAPES) ×4 IMPLANT
GAUZE SPONGE 4X4 12PLY STRL (GAUZE/BANDAGES/DRESSINGS) ×2 IMPLANT
GLOVE BIO SURGEON STRL SZ7.5 (GLOVE) ×2 IMPLANT
GLOVE BIOGEL PI IND STRL 6.5 (GLOVE) ×2 IMPLANT
GLOVE BIOGEL PI IND STRL 8 (GLOVE) ×2 IMPLANT
GLOVE ECLIPSE 6.0 STRL STRAW (GLOVE) ×2 IMPLANT
GOWN STRL REUS W/ TWL LRG LVL3 (GOWN DISPOSABLE) ×4 IMPLANT
GOWN STRL REUS W/ TWL XL LVL3 (GOWN DISPOSABLE) ×2 IMPLANT
GOWN STRL REUS W/TWL LRG LVL3 (GOWN DISPOSABLE) ×2
GOWN STRL REUS W/TWL XL LVL3 (GOWN DISPOSABLE) ×1
GRAFT TISS ANT TIB TNDN (Tissue) IMPLANT
KIT BASIN OR (CUSTOM PROCEDURE TRAY) ×2 IMPLANT
KIT CURVED DISP W/1.8 BIT (ORTHOPEDIC DISPOSABLE SUPPLIES) ×1
KIT HIP ARTHROSCOPY (ORTHOPEDIC DISPOSABLE SUPPLIES) ×2 IMPLANT
KIT HIP ARTHROSCOPY BANANA BLD (ORTHOPEDIC DISPOSABLE SUPPLIES) IMPLANT
KIT HIP CRVD W/1.8 BIT DISP (ORTHOPEDIC DISPOSABLE SUPPLIES) IMPLANT
KIT PATIENT POSITION SMALL (KITS) IMPLANT
KIT TURNOVER KIT B (KITS) ×2 IMPLANT
MANIFOLD NEPTUNE II (INSTRUMENTS) ×4 IMPLANT
NDL SPNL 18GX3.5 QUINCKE PK (NEEDLE) ×2 IMPLANT
NDL SUREFIRE SCORPION (NEEDLE) IMPLANT
NEEDLE SPNL 18GX3.5 QUINCKE PK (NEEDLE) ×1 IMPLANT
PACK BASIC III (CUSTOM PROCEDURE TRAY)
PACK SRG BSC III STRL LF ECLPS (CUSTOM PROCEDURE TRAY) ×2 IMPLANT
PAD ABD 8X10 STRL (GAUZE/BANDAGES/DRESSINGS) IMPLANT
PAD ARMBOARD 7.5X6 YLW CONV (MISCELLANEOUS) ×4 IMPLANT
PASSER SUT SWIFTSTITCH HIP CRT (INSTRUMENTS) IMPLANT
SPIKE FLUID TRANSFER (MISCELLANEOUS) ×2 IMPLANT
SPONGE T-LAP 18X18 ~~LOC~~+RFID (SPONGE) ×2 IMPLANT
SUTURE TAPE 1.3 FIBERLOP 20 ST (SUTURE) IMPLANT
SUTURETAPE 1.3 FIBERLOOP 20 ST (SUTURE) ×2
SYR 50ML LL SCALE MARK (SYRINGE) ×2 IMPLANT
TAPE CLOTH SURG 6X10 WHT LF (GAUZE/BANDAGES/DRESSINGS) IMPLANT
TENDON ANTERIOR TIBIALIS (Tissue) ×1 IMPLANT
TOWEL GREEN STERILE (TOWEL DISPOSABLE) ×2 IMPLANT
TUBE CONNECTING 12X1/4 (SUCTIONS) ×4 IMPLANT
TUBING ARTHROSCOPY IRRIG 16FT (MISCELLANEOUS) ×2 IMPLANT
WAND APOLLO RF 50D ABLATOR (BUR) IMPLANT
WATER STERILE IRR 1000ML POUR (IV SOLUTION) ×2 IMPLANT

## 2022-04-25 NOTE — Transfer of Care (Signed)
Immediate Anesthesia Transfer of Care Note  Patient: Brianna [name]  Procedure(s) Performed: RIGHT HIP ARTHROSCOPY WITH LABRAL RECONSTRUCTION, ILIOTIBIAL BAND ALLOGRAFT, PSOAS RELEASE (Right: Hip)  Patient Location: PACU  Anesthesia Type:GA combined with regional for post-op pain  Level of Consciousness: drowsy, patient cooperative, and responds to stimulation  Airway & Oxygen Therapy: Patient Spontanous Breathing and Patient connected to nasal cannula oxygen  Post-op Assessment: Report given to RN, Post -op Vital signs reviewed and stable, Patient moving all extremities X 4, and Patient able to stick tongue midline  Post vital signs: Reviewed  Last Vitals:  Vitals Value Taken Time  BP 119/80 04/25/22 1307  Temp 98.2   Pulse 75 04/25/22 1311  Resp 18 04/25/22 1311  SpO2 100 % 04/25/22 1311  Vitals shown include unvalidated device data.  Last Pain:  Vitals:   04/25/22 0800  PainSc: 5          Complications: No notable events documented.

## 2022-04-25 NOTE — Op Note (Signed)
Date of Surgery: 04/25/2022  INDICATIONS: Ms. Caridi is a 26 y.o.-year-old female with right hip residual pain and instability following labral repair.  The risk and benefits of the procedure were discussed in detail and documented in the pre-operative evaluation.   PREOPERATIVE DIAGNOSIS: 1.  Residual right hip instability  POSTOPERATIVE DIAGNOSIS: Same.  PROCEDURE: 1.  Right hip arthroscopic labral reconstruction  SURGEON: Benancio Deeds MD  ASSISTANT: Kerby Less, ATC  ANESTHESIA:  general plus femoral nerve block  IV FLUIDS AND URINE: See anesthesia record.  ANTIBIOTICS: Ancef 2g  ESTIMATED BLOOD LOSS: 10 mL.  IMPLANTS:  Implant Name Type Inv. Item Serial No. Manufacturer Lot No. LRB No. Used Action  TENDON ANTERIOR TIBIALIS - [id number] Tissue TENDON ANTERIOR TIBIALIS [id number] [employer]  Right 1 Implanted  ANCHOR SUT FIBERTAK 1.8 MT KL - [id number] Anchor ANCHOR SUT FIBERTAK 1.8 MT KL  [employer] [id number] Right 4 Implanted  ANCHOR SUT FIBERTAK 1.8 MT KL - [id number] Anchor ANCHOR SUT FIBERTAK 1.8 MT KL  [employer] [id number] Right 1 Implanted  ANCHOR SUT FIBERTAK 1.8 MT KL - [id number] Anchor ANCHOR SUT FIBERTAK 1.8 MT KL  [employer] [id number] Right 1 Implanted    DRAINS: None  CULTURES: None  COMPLICATIONS: none  DESCRIPTION OF PROCEDURE:  Cartilage Intact femoral and acetabular cartilage   Labrum Dysplastic appearing with anchors intact     OPERATIVE REPORT:  The patient was brought to the operating room, placed supine on the operating table, and bony prominences were padded.  The traction boots were applied with padding to ensure that safe traction could be applied through the feet.  The contralateral limb was abducted maximally and light traction was applied.  The operative leg was brought into neutral position.  The flouroscopic c-arm was brought between the legs for an AP image.  The patient was prepped and draped in a sterile fashion.   Time-out was performed and landmarks were identified. Traction was obtained and care was taken to ensure the least amount of force necessary to allow safe access to the joint of 8-54mm.  This was checked with fluoroscopy.    Next we placed an anterolateral portal under the assistance of fluoroscopy.  First, fluoroscopy was used to estimate the trajectory and starting point.  A 17mm incision with a #11 blade was made and a straight hemostat was used to dilate the portal through the appropriate tract.  We then placed a 14-gauge hypodermic needle with careful technique to be as close to the femoral head as possible and parallel to the sorcele to ensure no iatrogenic damage to the labrum.  This released the negative pressure environment and the amount of traction was adjusted to maintain the 8-81mm of distraction.  A nitinol wire was placed through the needle and flouroscopy was used to ensure it extended to the medial wall of the acetabulum.  The Flowport from [employer] was placed over the wire and the nitinol wire was retracted to just inside the capsule during insertion of the dilator and cannula to minimize the risk of breakage. The arthroscope was placed next and we visualized the anterior triangle.     We then placed the anterior portal under direct visualization using the technique described above.  This was safely placed as well without damage to the labrum or femoral head.  We then switched our arthroscope to the anterior portal to ensure we were not through the labrum - we were safely through the capsule only.  We  then proceeded with a transverse capsulotomy connecting the 2 portals in the same plane utilizing the Samurai blade from [employer].  We identified the anterior inferior iliac spine proximally, the psoas tendon medially and the rectus tendon laterally as landmarks.  We then proceeded with a diagnostic arthroscopy - the results can be found in the findings section above.  An  additional distal anterior lateral portal as well as posterior portal were established again with needle localization using fluoroscopy as needed.    We then used the 50 degree hip specific radiofrequency device from [employer]. and a 29mm shaver to clear the superior acetabulum and expose the subspinous region.  Next we exposed the acetabular rim leaving the chondral labral junction intact.  Working from both portals, the acetabular rim/subspinous region was reshaped with 5.5 mm bur consistent with the preoperative three-dimensional imaging.  At this time a tibialis anterior graft was prepared.  2 fiber tack suture anchors were placed in the anterior and posterior border of the labral reconstruction zone spanning from 2:00 to 11:00.  This was where the labrum was most deficient.  The self looping mechanism was fed in order to create a tension of the loop.  The graft was then passed in an antegrade fashion from posterior to anterior using a series of loop graspers through the 2 suture anchors.  These were tensioned and provided good apposition of the labrum to bone.  At this time an additional 2 fiber tack anchors were placed at the 1:00 on 1230 positions.  These were passed around the new labral graft as well as the old labrum.  These were then tensioned with good excellent apposition of the medial labrum to the residual labrum.  Traction was let down.  This resulted in anatomic labral repair  Traction was let down with total traction time of 155 minutes.     Finally, we performed a complete capsular closure with tape suture.  She was replaced in the anterior and posterior limb of the reported capsulotomy with excellent apposition. We then removed the arthroscope and closed the incisions with 3-0 nylon simple stitches.  A sterile dressing was applied..  The patient was awakened from anesthesia and transferred to PACU in stable condition. Postoperative care includes:       POSTOPERATIVE PLAN:    2  weeks touchdown weight bearing, hip labral repair protocol, she will had to the office immediately for a hip spica brace to be placed Formal physical therapy will begin this week. ASA 325 Daily for DVT prophylaxis    Benancio Deeds, MD 1:10 PM

## 2022-04-25 NOTE — Anesthesia Procedure Notes (Signed)
Procedure Name: Intubation Date/Time: 04/25/2022 9:45 AM  Performed by: Cy Blamer, CRNAPre-anesthesia Checklist: Patient identified, Emergency Drugs available, Suction available and Patient being monitored Patient Re-evaluated:Patient Re-evaluated prior to induction Oxygen Delivery Method: Circle system utilized Preoxygenation: Pre-oxygenation with 100% oxygen Induction Type: IV induction Ventilation: Mask ventilation without difficulty Laryngoscope Size: Miller and 2 Grade View: Grade I Tube type: Oral Tube size: 7.0 mm Number of attempts: 1 Airway Equipment and Method: Stylet and Oral airway Placement Confirmation: ETT inserted through vocal cords under direct vision, positive ETCO2 and breath sounds checked- equal and bilateral Secured at: 21 cm Tube secured with: Tape Dental Injury: Teeth and Oropharynx as per pre-operative assessment

## 2022-04-25 NOTE — Interval H&P Note (Signed)
History and Physical Interval Note:  04/25/2022 8:39 AM  Brianna [name]  has presented today for surgery, with the diagnosis of RIGHT HIP LABRAL TEAR.  The various methods of treatment have been discussed with the patient and family. After consideration of risks, benefits and other options for treatment, the patient has consented to  Procedure(s): RIGHT HIP ARTHROSCOPY WITH LABRAL RECONSTRUCTION, ILIOTIBIAL BAND ALLOGRAFT, PSOAS RELEASE (Right) as a surgical intervention.  The patient's history has been reviewed, patient examined, no change in status, stable for surgery.  I have reviewed the patient's chart and labs.  Questions were answered to the patient's satisfaction.     [name]

## 2022-04-25 NOTE — Anesthesia Postprocedure Evaluation (Signed)
Anesthesia Post Note  Patient: Brianna [name]  Procedure(s) Performed: RIGHT HIP ARTHROSCOPY WITH LABRAL RECONSTRUCTION, ILIOTIBIAL BAND ALLOGRAFT, PSOAS RELEASE (Right: Hip)     Patient location during evaluation: PACU Anesthesia Type: General Level of consciousness: awake and alert Pain management: pain level controlled Vital Signs Assessment: post-procedure vital signs reviewed and stable Respiratory status: spontaneous breathing, nonlabored ventilation and respiratory function stable Cardiovascular status: blood pressure returned to baseline and stable Postop Assessment: no apparent nausea or vomiting Anesthetic complications: no   No notable events documented.  Last Vitals:  Vitals:   04/25/22 1400 04/25/22 1415  BP: 126/76 132/85  Pulse: 90 90  Resp: 14 15  Temp:  36.8 C  SpO2: 100% 100%    Last Pain:  Vitals:   04/25/22 1415  PainSc: 0-No pain                 Lucretia Kern

## 2022-04-25 NOTE — H&P (Signed)
Post Operative Evaluation      Procedure/Date of Surgery: Right hip arthroscopy with labral repair, pincer debridement 03/02/21   Interval History:    02/01/2022: Today for follow-up of her MRI of the right hip.  She continues to have pain.  She did get some relief from the psoas injection at the previous visit.  She continues to experience painful popping and clicking in the hip and this is feeling overall unstable.  She has tried additional physical therapy with limited relief.  She is here today for further assessment     PMH/PSH/Family History/Social History/Meds/Allergies:         Past Medical History:  Diagnosis Date   Acute recurrent pansinusitis 10/21/2019   ADHD (attention deficit hyperactivity disorder)      inattentive   Anemia     Anxiety     Asthma     Bronchitis     Cat allergy due to both airborne and skin contact 05/01/2016   Chronic sore throat 05/01/2016   COVID 2020   Depression     Fibromyalgia     Gastroparesis     GERD (gastroesophageal reflux disease)     Hypermobile Ehlers-Danlos syndrome     Irregular heart rate     Keratosis pilaris     Migraine     Seasonal allergic rhinitis 03/11/2014         Past Surgical History:  Procedure Laterality Date   ESOPHAGOGASTRODUODENOSCOPY ENDOSCOPY        03/2020   JOINT REPLACEMENT       LABRAL REPAIR Right 03/02/2021    Procedure: RIGHT HIP ARTHROSCOPY WITH LABRAL REPAIR AND PINCE DEBRIDEMENT;  Surgeon: [name], MD;  Location: [facility] OR;  Service: Orthopedics;  Laterality: Right;   SMART PILL PROCEDURE        12/2020   TYMPANOSTOMY TUBE PLACEMENT Bilateral     WISDOM TOOTH EXTRACTION        Social History         Socioeconomic History   Marital status: Single      Spouse name: Not on file   Number of children: Not on file   Years of education: Not on file   Highest education level: Not on file  Occupational History   Not on file  Tobacco Use   Smoking status: Former      Types:  E-cigarettes, Cigarettes      Quit date: 2021      Years since quitting: 2.6   Smokeless tobacco: Never  Vaping Use   Vaping Use: Former   Substances: Financial trader  Substance and Sexual Activity   Alcohol use: Not Currently   Drug use: Yes      Types: Marijuana      Comment: last use 7 days ago   Sexual activity: Yes      Birth control/protection: None  Other Topics Concern   Not on file  Social History Narrative    Right handed    Social Determinants of Health    Financial Resource Strain: Not on file  Food Insecurity: Not on file  Transportation Needs: Not on file  Physical Activity: Not on file  Stress: Not on file  Social Connections: Not on file         Family History  Problem Relation Age of Onset   Diabetes Mother     Polycystic ovary syndrome Mother     GER disease Father     Heart disease Paternal Uncle  Allergies  Allergen Reactions   Clindamycin Hives, Swelling and Rash   Tretinoin Dermatitis, Itching, Photosensitivity and Swelling          Current Outpatient Medications  Medication Sig Dispense Refill   Adapalene 0.3 % gel Apply topically.       etonogestrel-ethinyl estradiol (NUVARING) 0.12-0.015 MG/24HR vaginal ring INSERT 1 VAGINAL RING EVERY MONTH BY VAGINAL ROUTE       Ferrous Sulfate Dried (EQ SLOW-RELEASE IRON) 45 MG TBCR Take 1 tablet by mouth every 3 (three) days.       finasteride (PROPECIA) 1 MG tablet Take 1 mg by mouth daily.       Galcanezumab-gnlm (EMGALITY) 120 MG/ML SOAJ         lamoTRIgine (LAMICTAL) 25 MG tablet         lidocaine (LIDODERM) 5 % Place 1 patch onto the skin daily. Remove & Discard patch within 12 hours or as directed by MD 5 patch 1   naratriptan (AMERGE) 2.5 MG tablet Take by mouth.       Omega-3 Fatty Acids (FISH OIL PO) Take 700 Units by mouth.       ondansetron (ZOFRAN ODT) 4 MG disintegrating tablet Take 1 tablet (4 mg total) by mouth every 8 (eight) hours as needed for nausea or vomiting. 20  tablet 5   Pediatric Multiple Vitamins (PEDIATRIC MULTIVITAMIN) chewable tablet Chew 1 tablet by mouth daily.       promethazine (PHENERGAN) 25 MG tablet Take 1 tablet (25 mg total) by mouth every 6 (six) hours as needed for nausea or vomiting. 30 tablet 0    No current facility-administered medications for this visit.    Imaging Results (Last 48 hours)  No results found.     Review of Systems:   A ROS was performed including pertinent positives and negatives as documented in the HPI.     Musculoskeletal Exam:       Inspection Right Left  Skin No atrophy or gross abnormalities appreciated No atrophy or gross abnormalities appreciated  Palpation      Tenderness None None  Crepitus None None  Range of Motion      Flexion (passive) 120 120  Extension 30 30  IR 45 with pain 45  ER 50 50  Strength      Flexion  5/5 5/5  Extension 5/5 5/5  Special Tests      FABER Negative Negative  FADIR Positive Negative  ER Lag/Capsular Insufficiency Negative Negative  Instability Negative Negative  Sacroiliac pain Negative  Negative   Instability      Generalized Laxity No No  Neurologic      sciatic, femoral, obturator nerves intact to light sensation  Vascular/Lymphatic      DP pulse 2+ 2+  Lumbar Exam      Patient has symmetric lumbar range of motion with negative pain referral to hip      Imaging:     None   I personally reviewed and interpreted the radiographs.     Assessment:   26 year old female status post right hip arthroscopic labral repair.  Postoperatively she continues to have pain and clicking with residual hip instability type symptoms.  Given the fact that she has trialed additional physical therapy and is overall continuing to have hip pain and instability I do believe that she would be a candidate for right hip labral reconstruction.  I did discuss that given that this would be revision surgery that overall I cannot guarantee a perfect outcome.  I did discuss that  with her history of mixed tissue connective disorders I am specifically concerned of the hypoplastic nature of her labrum and's ability to maintain a suction seal.  I did discuss that given that she has had a previous relief with psoas injection I would plan to perform a psoas release at that time as well.  Overall I did continue to discuss nonoperative management include continued physical therapy.  At this time she is not satisfied with the quality of her hip and is hoping to undergo revision surgery in order to hopefully get her a stable and improved hip Plan :     -Plan for right hip arthroscopy with labral reconstruction and psoas release     After a lengthy discussion of treatment options, including risks, benefits, alternatives, complications of surgical and nonsurgical conservative options, the patient elected surgical repair.    The patient  is aware of the material risks  and complications including, but not limited to injury to adjacent structures, neurovascular injury, infection, numbness, bleeding, implant failure, thermal burns, stiffness, persistent pain, failure to heal, disease transmission from allograft, need for further surgery, dislocation, anesthetic risks, blood clots, risks of death,and others. The probabilities of surgical success and failure discussed with patient given their particular co-morbidities.The time and nature of expected rehabilitation and recovery was discussed.The patient's questions were all answered preoperatively.  No barriers to understanding were noted. I explained the natural history of the disease process and Rx rationale.  I explained to the patient what I considered to be reasonable expectations given their personal situation.  The final treatment plan was arrived at through a shared patient decision making process model.       I personally saw and evaluated the patient, and participated in the management and treatment plan.   Vanetta Mulders,  MD Attending Physician, Orthopedic Surgery   This document was dictated using Dragon voice recognition software. A reasonable attempt at proof reading has been made to minimize errors.

## 2022-04-25 NOTE — Brief Op Note (Signed)
   Brief Op Note  Date of Surgery: 04/25/2022  Preoperative Diagnosis: RIGHT HIP LABRAL TEAR  Postoperative Diagnosis: same  Procedure: Procedure(s): RIGHT HIP ARTHROSCOPY WITH LABRAL RECONSTRUCTION, ILIOTIBIAL BAND ALLOGRAFT, PSOAS RELEASE  Implants: Implant Name Type Inv. Item Serial No. Manufacturer Lot No. LRB No. Used Action  TENDON ANTERIOR TIBIALIS - [id number] Tissue TENDON ANTERIOR TIBIALIS [id number] [employer]  Right 1 Implanted  ANCHOR SUT FIBERTAK 1.8 MT KL - [id number] Anchor ANCHOR SUT FIBERTAK 1.8 MT KL  [employer] [id number] Right 4 Implanted  ANCHOR SUT FIBERTAK 1.8 MT KL - [id number] Anchor ANCHOR SUT FIBERTAK 1.8 MT KL  [employer] [id number] Right 1 Implanted  ANCHOR SUT FIBERTAK 1.8 MT KL - [id number] Anchor ANCHOR SUT FIBERTAK 1.8 MT KL  [employer] [id number] Right 1 Implanted    Surgeons: Surgeon(s): [name], MD  Anesthesia: Regional    Estimated Blood Loss: See anesthesia record  Complications: None  Condition to PACU: Stable  Benancio Deeds, MD 04/25/2022 1:04 PM

## 2022-04-25 NOTE — Anesthesia Procedure Notes (Addendum)
Anesthesia Regional Block: Femoral nerve block   Pre-Anesthetic Checklist: , timeout performed,  Correct Patient, Correct Site, Correct Laterality,  Correct Procedure, Correct Position, site marked,  Risks and benefits discussed,  Surgical consent,  Pre-op evaluation,  At surgeon's request and post-op pain management  Laterality: Right  Prep: chloraprep       Needles:  Injection technique: Single-shot  Needle Type: Echogenic Stimulator Needle     Needle Length: 10cm  Needle Gauge: 20     Additional Needles:   Procedures:, nerve stimulator,,, ultrasound used (permanent image in chart),,     Nerve Stimulator or Paresthesia:  Response: quadriceps  Additional Responses:   Narrative:  Start time: 04/25/2022 9:18 AM End time: 04/25/2022 9:22 AM Injection made incrementally with aspirations every 5 mL.  Performed by: Personally  Anesthesiologist: Lucretia Kern, MD  Additional Notes: Standard monitors applied. Skin prepped. Good needle visualization with ultrasound. Injection made in 5cc increments with no resistance to injection. Patient tolerated the procedure well.

## 2022-04-25 NOTE — Discharge Instructions (Signed)
     Discharge Instructions    Attending Surgeon: [name], MD Office Phone Number: [phone]   Diagnosis and Procedures:    Surgeries Performed: Right hip labral reconstruction  Discharge Plan:    Diet: Resume usual diet. Begin with light or bland foods.  Drink plenty of fluids.  Activity:  Touchdown weightbearing right lower extremity in brace. You are advised to go home directly from the hospital or surgical center. Restrict your activities.  GENERAL INSTRUCTIONS: 1.  Please apply ice to your wound to help with swelling and inflammation. This will improve your comfort and your overall recovery following surgery.     2. Please call Dr. Serena Croissant office at [phone] with questions Monday-Friday during business hours. If no one answers, please leave a message and someone should get back to the patient within 24 hours. For emergencies please call 911 or proceed to the emergency room.   3. Patient to notify surgical team if experiences any of the following: Bowel/Bladder dysfunction, uncontrolled pain, nerve/muscle weakness, incision with increased drainage or redness, nausea/vomiting and Fever greater than 101.0 F.  Be alert for signs of infection including redness, streaking, odor, fever or chills. Be alert for excessive pain or bleeding and notify your surgeon immediately.  WOUND INSTRUCTIONS:   Leave your dressing, cast, or splint in place until your post operative visit.  Keep it clean and dry.  Always keep the incision clean and dry until the staples/sutures are removed. If there is no drainage from the incision you should keep it open to air. If there is drainage from the incision you must keep it covered at all times until the drainage stops  Do not soak in a bath tub, hot tub, pool, lake or other body of water until 21 days after your surgery and your incision is completely dry and healed.  If you have removable sutures (or staples) they must be removed 10-14  days (unless otherwise instructed) from the day of your surgery.     1)  Elevate the extremity as much as possible.  2)  Keep the dressing clean and dry.  3)  Please call us if the dressing becomes wet or dirty.  4)  If you are experiencing worsening pain or worsening swelling, please call.     MEDICATIONS: Resume all previous home medications at the previous prescribed dose and frequency unless otherwise noted Start taking the  pain medications on an as-needed basis as prescribed  Please taper down pain medication over the next week following surgery.  Ideally you should not require a refill of any narcotic pain medication.  Take pain medication with food to minimize nausea. In addition to the prescribed pain medication, you may take over-the-counter pain relievers such as Tylenol.  Do NOT take additional tylenol if your pain medication already has tylenol in it.  Aspirin 325mg  daily for four weeks.      FOLLOWUP INSTRUCTIONS: 1. Follow up at the Physical Therapy Clinic 3-4 days following surgery. This appointment should be scheduled unless other arrangements have been made.The Physical Therapy scheduling number is [phone] if an appointment has not already been arranged.  2. Contact Dr. [phone] office during office hours at [phone] or the practice after hours line at [phone] for non-emergencies. For medical emergencies call 911.   Discharge Location: Home

## 2022-04-25 NOTE — Progress Notes (Signed)
Orthopedic Tech Progress Note Patient Details:  Brianna [name] 1996-02-23 [id number]  PACU RN called requesting a pair of CRUTCHES, she has used them before  but was in sooo much pain she just cried,   Ortho Devices Type of Ortho Device: Crutches Ortho Device/Splint Interventions: Ordered, Application, Adjustment   Post Interventions Patient Tolerated: Ambulated well, Difficulty with ambulation Instructions Provided: Care of device, Poper ambulation with device  Donald Pore 04/25/2022, 5:26 PM

## 2022-04-26 ENCOUNTER — Telehealth: Payer: Self-pay

## 2022-04-26 ENCOUNTER — Telehealth: Payer: Self-pay | Admitting: Orthopaedic Surgery

## 2022-04-26 ENCOUNTER — Encounter (HOSPITAL_BASED_OUTPATIENT_CLINIC_OR_DEPARTMENT_OTHER): Payer: Self-pay | Admitting: Orthopaedic Surgery

## 2022-04-26 NOTE — Telephone Encounter (Signed)
Patient states she has questions about her surgical site. Please [phone]

## 2022-04-26 NOTE — Telephone Encounter (Signed)
I spoke with Dr. Steward Drone.  He stated that would call Winna and answer her questions.  Also, arranged for Rayfield Citizen from our brace company to meet the patient at our office at 9:15am to adjust brace if needed.  Dr. Steward Drone made Revonda Standard aware of the appointment.

## 2022-04-26 NOTE — Telephone Encounter (Signed)
Patient to meet with [employer] rep Rayfield Citizen tomorrow, Thursday 12/7 at 915

## 2022-04-26 NOTE — Telephone Encounter (Signed)
Patient was calling again concerning her brace not being fitted correctly and incision being exposed due to her dressing coming off.  Talked with Elnita Maxwell and will get in touch with Dr. Steward Drone.

## 2022-04-27 DIAGNOSIS — F411 Generalized anxiety disorder: Secondary | ICD-10-CM | POA: Diagnosis not present

## 2022-04-28 ENCOUNTER — Encounter (HOSPITAL_BASED_OUTPATIENT_CLINIC_OR_DEPARTMENT_OTHER): Payer: Self-pay | Admitting: Physical Therapy

## 2022-04-28 ENCOUNTER — Ambulatory Visit (HOSPITAL_BASED_OUTPATIENT_CLINIC_OR_DEPARTMENT_OTHER): Payer: BC Managed Care – PPO | Attending: Orthopaedic Surgery | Admitting: Physical Therapy

## 2022-04-28 ENCOUNTER — Other Ambulatory Visit: Payer: Self-pay

## 2022-04-28 DIAGNOSIS — G4486 Cervicogenic headache: Secondary | ICD-10-CM | POA: Diagnosis not present

## 2022-04-28 DIAGNOSIS — G43719 Chronic migraine without aura, intractable, without status migrainosus: Secondary | ICD-10-CM | POA: Diagnosis not present

## 2022-04-28 DIAGNOSIS — R262 Difficulty in walking, not elsewhere classified: Secondary | ICD-10-CM | POA: Diagnosis not present

## 2022-04-28 DIAGNOSIS — M6281 Muscle weakness (generalized): Secondary | ICD-10-CM | POA: Diagnosis not present

## 2022-04-28 DIAGNOSIS — M25551 Pain in right hip: Secondary | ICD-10-CM | POA: Insufficient documentation

## 2022-04-28 NOTE — Therapy (Unsigned)
OUTPATIENT PHYSICAL THERAPY LOWER EXTREMITY EVALUATION   Patient Name: Brianna [name] MRN: [id number] DOB:1995/07/16, 26 y.o., female Today's Date: 04/28/2022  END OF SESSION:  PT End of Session - 04/28/22 1009     Visit Number 1    Number of Visits 25    Date for PT Re-Evaluation 07/21/22    Authorization Type BCBS    PT Start Time 0930    PT Stop Time 1005    PT Time Calculation (min) 35 min    Activity Tolerance Patient tolerated treatment well    Behavior During Therapy [facility] for tasks assessed/performed             Past Medical History:  Diagnosis Date   Acute recurrent pansinusitis 10/21/2019   ADHD (attention deficit hyperactivity disorder)    inattentive   Anemia    had iron infusions in Fall of 2023   Anxiety    Asthma    Bronchitis    Cat allergy due to both airborne and skin contact 05/01/2016   Chronic sore throat 05/01/2016   COVID 2020   Depression    Fibromyalgia    pt denies   Gastroparesis    GERD (gastroesophageal reflux disease)    History of hypotension    taken midodrine in the past- Spring of 2022   Hypermobile Ehlers-Danlos syndrome    Irregular heart rate    Keratosis pilaris    Migraine    PONV (postoperative nausea and vomiting)    "I can get really cold"   PTSD (post-traumatic stress disorder)    Seasonal allergic rhinitis 03/11/2014   Past Surgical History:  Procedure Laterality Date   CHOLECYSTECTOMY  05/09/2021   ESOPHAGOGASTRODUODENOSCOPY ENDOSCOPY     03/2020   LABRAL REPAIR Right 03/02/2021   Procedure: RIGHT HIP ARTHROSCOPY WITH LABRAL REPAIR AND PINCE DEBRIDEMENT;  Surgeon: [name], MD;  Location: [facility] OR;  Service: Orthopedics;  Laterality: Right;   SMART PILL PROCEDURE     12/2020   TYMPANOSTOMY TUBE PLACEMENT Bilateral    WISDOM TOOTH EXTRACTION     Patient Active Problem List   Diagnosis Date Noted   Cervical adenopathy 06/10/2021   Chronic pruritus 06/10/2021   Exercise-induced asthma 06/09/2021    Chronic venous insufficiency 06/03/2021   Orthostatic lightheadedness 06/03/2021   Purple toe syndrome of both feet (HCC) 06/03/2021   Autonomic dysfunction 06/03/2021   Tear of right acetabular labrum 04/13/2021   Migraine headache 03/01/2021   Headache 11/30/2020   Positive ANA (antinuclear antibody) 11/15/2020   Fibromyalgia 11/02/2020   Hair loss 11/02/2020   Muscle spasticity 10/22/2020   Hypermobile Ehlers-Danlos syndrome 10/22/2020   Paresthesia of bilateral legs 10/06/2020   Myalgia 10/06/2020   Chronic pelvic pain in female 10/06/2020   Chronic abdominal pain 10/06/2020   Irritable colon 08/02/2020   Pelvic floor dysfunction 08/02/2020   Irritant dermatitis 08/02/2020   Gastroesophageal reflux disease 06/28/2020   Severe recurrent major depression without psychotic features (HCC) 06/28/2020   Polyarthralgia 06/28/2020   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 06/28/2020   Tachycardia 06/28/2020   History of COVID-19 06/28/2020   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 deficit 06/28/2020   Recurrent major depressive disorder, in full remission (HCC) 06/28/2020   Atypical chest pain 06/22/2020   Dizziness 06/22/2020   Palpitations 06/14/2020   Anxiety 05/04/2020   Internal and external hemorrhoids without complication 04/29/2020   Nondiabetic gastroparesis 03/03/2020   ETD (Eustachian tube dysfunction), bilateral 10/21/2019   Acne vulgaris 03/11/2014  Mild intermittent asthma without complication 03/11/2014   Seasonal allergies 03/11/2014    REFERRING PROVIDER:  [name], MD     REFERRING DIAG: [phone] (ICD-10-CM) - Tear of right acetabular labrum, subsequent encounter  post op 04/25/22 R hip labral repair revision with IT band allograft   THERAPY DIAG:  Pain in right hip  Muscle weakness (generalized)  Difficulty in walking, not elsewhere classified  Rationale for Evaluation and Treatment: Rehabilitation  ONSET  DATE: DOS 04/25/22  SUBJECTIVE:   SUBJECTIVE STATEMENT: Some numbness in calf and big toe that started before surgery.   PERTINENT HISTORY: EDS PAIN:  Are you having pain? Yes: NPRS scale: 5/10 Pain location: rt hip Pain description: sore Aggravating factors: movement Relieving factors: rest  PRECAUTIONS: Other: TDWB 2 weeks post op  WEIGHT BEARING RESTRICTIONS: TDWB 2 wk post op  FALLS:  Has patient fallen in last 6 months? 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  PLOF: Independent  PATIENT GOALS: yoga, take dance classes  OBJECTIVE:   PATIENT SURVEYS:  {rehab surveys:24030}  POSTURE: {posture:25561}  PALPATION: ***  LOWER EXTREMITY ROM:  {AROM/PROM:27142} ROM Right eval Left eval  Hip flexion    Hip extension    Hip abduction    Hip adduction    Hip internal rotation    Hip external rotation    Knee flexion    Knee extension    Ankle dorsiflexion    Ankle plantarflexion    Ankle inversion    Ankle eversion     (Blank rows = not tested)  LOWER EXTREMITY MMT:  MMT Right eval Left eval  Hip flexion    Hip extension    Hip abduction    Hip adduction    Hip internal rotation    Hip external rotation    Knee flexion    Knee extension    Ankle dorsiflexion    Ankle plantarflexion    Ankle inversion    Ankle eversion     (Blank rows = not tested)  LOWER EXTREMITY SPECIAL TESTS:  {LEspecialtests:26242}  FUNCTIONAL TESTS:  {Functional tests:24029}  GAIT: Distance walked: *** Assistive device utilized: {Assistive devices:23999} Level of assistance: {Levels of assistance:24026} Comments: ***   TODAY'S TREATMENT:                                                                                                                              DATE: ***    PATIENT EDUCATION:  Education details: *** Person educated: {Person educated:25204} Education method: {Education Method:25205} Education comprehension: {Education  Comprehension:25206}  HOME EXERCISE PROGRAM: ***  ASSESSMENT:  CLINICAL IMPRESSION: Patient is a *** y.o. *** who was seen today for physical therapy evaluation and treatment for ***.   OBJECTIVE IMPAIRMENTS: {opptimpairments:25111}.   ACTIVITY LIMITATIONS: {activitylimitations:27494}  PARTICIPATION LIMITATIONS: {participationrestrictions:25113}  PERSONAL FACTORS: {Personal factors:25162} are also affecting patient's functional outcome.   REHAB POTENTIAL: {rehabpotential:25112}  CLINICAL DECISION MAKING: {clinical decision making:25114}  EVALUATION COMPLEXITY: {Evaluation complexity:25115}  GOALS: Goals reviewed with patient? {yes/no:20286}  SHORT TERM GOALS: Target date: *** *** Baseline: Goal status: {GOALSTATUS:25110}  2.  *** Baseline:  Goal status: {GOALSTATUS:25110}  3.  *** Baseline:  Goal status: {GOALSTATUS:25110}  4.  *** Baseline:  Goal status: {GOALSTATUS:25110}  5.  *** Baseline:  Goal status: {GOALSTATUS:25110}  6.  *** Baseline:  Goal status: {GOALSTATUS:25110}  LONG TERM GOALS: Target date: ***  *** Baseline:  Goal status: {GOALSTATUS:25110}  2.  *** Baseline:  Goal status: {GOALSTATUS:25110}  3.  *** Baseline:  Goal status: {GOALSTATUS:25110}  4.  *** Baseline:  Goal status: {GOALSTATUS:25110}  5.  *** Baseline:  Goal status: {GOALSTATUS:25110}  6.  *** Baseline:  Goal status: {GOALSTATUS:25110}   PLAN:  PT FREQUENCY: {rehab frequency:25116}  PT DURATION: {rehab duration:25117}  PLANNED INTERVENTIONS: {rehab planned interventions:25118::"Therapeutic exercises","Therapeutic activity","Neuromuscular re-education","Balance training","Gait training","Patient/Family education","Self Care","Joint mobilization"}  PLAN FOR NEXT SESSION: ***   Army Fossa, PT 04/28/2022, 1:55 PM

## 2022-04-30 DIAGNOSIS — G478 Other sleep disorders: Secondary | ICD-10-CM | POA: Diagnosis not present

## 2022-05-02 ENCOUNTER — Encounter (HOSPITAL_COMMUNITY): Payer: Self-pay | Admitting: Orthopaedic Surgery

## 2022-05-03 ENCOUNTER — Telehealth: Payer: Self-pay | Admitting: Physical Medicine and Rehabilitation

## 2022-05-03 NOTE — Telephone Encounter (Signed)
Patient called and left a voicemail requesting a call back , she has received a letter from her insurance that they denied botox .

## 2022-05-04 DIAGNOSIS — F3341 Major depressive disorder, recurrent, in partial remission: Secondary | ICD-10-CM | POA: Diagnosis not present

## 2022-05-04 DIAGNOSIS — R45851 Suicidal ideations: Secondary | ICD-10-CM | POA: Diagnosis not present

## 2022-05-04 DIAGNOSIS — Q796 Ehlers-Danlos syndrome, unspecified: Secondary | ICD-10-CM | POA: Diagnosis not present

## 2022-05-04 DIAGNOSIS — F411 Generalized anxiety disorder: Secondary | ICD-10-CM | POA: Diagnosis not present

## 2022-05-04 NOTE — Therapy (Signed)
OUTPATIENT PHYSICAL THERAPY LOWER EXTREMITY TREATMENT   Patient Name: Brianna [name] MRN: [id number] DOB:1995-12-16, 26 y.o., female Today's Date: 05/05/2022  END OF SESSION:  PT End of Session - 05/05/22 1321     Visit Number 2    Number of Visits 25    Date for PT Re-Evaluation 07/21/22    Authorization Type BC[employer]    PT Start Time 1320    PT Stop Time 1358    PT Time Calculation (min) 38 min    Activity Tolerance Patient tolerated treatment well    Behavior During Therapy [facility] for tasks assessed/performed              Past Medical History:  Diagnosis Date   Acute recurrent pansinusitis 10/21/2019   ADHD (attention deficit hyperactivity disorder)    inattentive   Anemia    had iron infusions in Fall of 2023   Anxiety    Asthma    Bronchitis    Cat allergy due to both airborne and skin contact 05/01/2016   Chronic sore throat 05/01/2016   COVID 2020   Depression    Fibromyalgia    pt denies   Gastroparesis    GERD (gastroesophageal reflux disease)    History of hypotension    taken midodrine in the past- Spring of 2022   Hypermobile Ehlers-Danlos syndrome    Irregular heart rate    Keratosis pilaris    Migraine    PONV (postoperative nausea and vomiting)    "I can get really cold"   PTSD (post-traumatic stress disorder)    Seasonal allergic rhinitis 03/11/2014   Past Surgical History:  Procedure Laterality Date   CHOLECYSTECTOMY  05/09/2021   ESOPHAGOGASTRODUODENOSCOPY ENDOSCOPY     03/2020   HIP ARTHROSCOPY Right 04/25/2022   Procedure: RIGHT HIP ARTHROSCOPY WITH LABRAL RECONSTRUCTION, ILIOTIBIAL BAND ALLOGRAFT, PSOAS RELEASE;  Surgeon: [name], MD;  Location: [facility] OR;  Service: Orthopedics;  Laterality: Right;   LABRAL REPAIR Right 03/02/2021   Procedure: RIGHT HIP ARTHROSCOPY WITH LABRAL REPAIR AND PINCE DEBRIDEMENT;  Surgeon: [name], MD;  Location: [facility] OR;  Service: Orthopedics;  Laterality: Right;   SMART PILL PROCEDURE      12/2020   TYMPANOSTOMY TUBE PLACEMENT Bilateral    WISDOM TOOTH EXTRACTION     Patient Active Problem List   Diagnosis Date Noted   Cervical adenopathy 06/10/2021   Chronic pruritus 06/10/2021   Exercise-induced asthma 06/09/2021   Chronic venous insufficiency 06/03/2021   Orthostatic lightheadedness 06/03/2021   Purple toe syndrome of both feet (HCC) 06/03/2021   Autonomic dysfunction 06/03/2021   Tear of right acetabular labrum 04/13/2021   Migraine headache 03/01/2021   Headache 11/30/2020   Positive ANA (antinuclear antibody) 11/15/2020   Fibromyalgia 11/02/2020   Hair loss 11/02/2020   Muscle spasticity 10/22/2020   Hypermobile Ehlers-Danlos syndrome 10/22/2020   Paresthesia of bilateral legs 10/06/2020   Myalgia 10/06/2020   Chronic pelvic pain in female 10/06/2020   Chronic abdominal pain 10/06/2020   Irritable colon 08/02/2020   Pelvic floor dysfunction 08/02/2020   Irritant dermatitis 08/02/2020   Gastroesophageal reflux disease 06/28/2020   Severe recurrent major depression without psychotic features (HCC) 06/28/2020   Polyarthralgia 06/28/2020   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 06/28/2020   Tachycardia 06/28/2020   History of COVID-19 06/28/2020   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 deficit 06/28/2020   Recurrent major depressive disorder, in full remission (HCC) 06/28/2020   Atypical chest pain 06/22/2020   Dizziness 06/22/2020  Palpitations 06/14/2020   Anxiety 05/04/2020   Internal and external hemorrhoids without complication 04/29/2020   Nondiabetic gastroparesis 03/03/2020   ETD (Eustachian tube dysfunction), bilateral 10/21/2019   Acne vulgaris 03/11/2014   Mild intermittent asthma without complication 03/11/2014   Seasonal allergies 03/11/2014    REFERRING PROVIDER:  [name], MD     REFERRING DIAG: [phone] (ICD-10-CM) - Tear of right acetabular labrum, subsequent encounter  post op 04/25/22 R hip  labral repair revision with IT band allograft   THERAPY DIAG:  Pain in right hip  Muscle weakness (generalized)  Difficulty in walking, not elsewhere classified  Rationale for Evaluation and Treatment: Rehabilitation  ONSET DATE: DOS 04/25/22  SUBJECTIVE:   SUBJECTIVE STATEMENT: Leg is constantly purple. Jsut stiff feeling  PERTINENT HISTORY: EDS PAIN:  Are you having pain? Yes: NPRS scale: 5/10 Pain location: rt hip Pain description: sore Aggravating factors: movement Relieving factors: rest  PRECAUTIONS: Other: TDWB 2 weeks post op  WEIGHT BEARING RESTRICTIONS: TDWB 2 wk post op  FALLS:  Has patient fallen in last 6 months? 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  PLOF: Independent  PATIENT GOALS: yoga, take dance classes  OBJECTIVE:   PATIENT SURVEYS:  FOTO 27  POSTURE:flexed on crutches as expected  PALPATION: End ranges WFL  LOWER EXTREMITY ROM:  Passive ROM Right eval   Hip flexion 80   Hip extension 0   Hip abduction 30   Hip adduction    Hip internal rotation    Hip external rotation    Knee flexion    Knee extension    Ankle dorsiflexion    Ankle plantarflexion    Ankle inversion    Ankle eversion     (Blank rows = not tested)   GAIT: EVAL: TDWB with bil axillary crutches   TODAY'S TREATMENT:                                                                                                                               Treatment                            05/05/22:  PROM flexion & abd, paired with distraction Roller to Rt quads Hooklying bent knee fall outs with trA Bike no resist   Treatment                            EVAL 04/28/22:  PROM & taught mom to do so Sidelying hip abd & clams   PATIENT EDUCATION:  Education details: Anatomy of condition, POC, HEP, exercise form/rationale Person educated: Patient and Parent Education method: Explanation, Demonstration, Tactile cues, Verbal cues, and Handouts Education  comprehension: verbalized understanding, returned demonstration, verbal cues required, tactile cues required, and needs further education  HOME EXERCISE PROGRAM: PROM by Mom [id number]  ASSESSMENT:  CLINICAL IMPRESSION: Excellent tolerance to treatment today with  notable decrease in thigh pain following soft tissue rolling to quads.  Increased grip height on crutches to improve posture and asked that she flex her knee in a swing through phase of gait.  OBJECTIVE IMPAIRMENTS: Abnormal gait, decreased activity tolerance, decreased balance, difficulty walking, decreased ROM, decreased strength, increased muscle spasms, impaired flexibility, improper body mechanics, and pain.   ACTIVITY LIMITATIONS: carrying, lifting, bending, sitting, standing, squatting, stairs, transfers, bed mobility, bathing, dressing, and locomotion level  PARTICIPATION LIMITATIONS: meal prep, cleaning, medication management, driving, shopping, community activity, and occupation  PERSONAL FACTORS: 3+ comorbidities: previous labral repair, EDS, autonomic dysfunction  are also affecting patient's functional outcome.     GOALS: Goals reviewed with patient? Yes  SHORT TERM GOALS: Target date: 05/26/22 30s STS without pain Baseline: Goal status: INITIAL  2.  Able to demo household ambulation without AD and with proper pattern Baseline:  Goal status: INITIAL    LONG TERM GOALS:   Able to demo step up on 6" step with level pelvis and proper form Baseline:  Goal status: 06/20/22  2.  Will tolerate at least 3 min on elliptical, demonstrating good tolerance to repetitive weight bearing motion Baseline:  Goal status: INITIAL Week 8 06/20/22  3.  Demonstrate proper form in at least 10 continuous lunges without increased pain Baseline:  Goal status: INITIAL Week 12 07/18/22  4.  Demonstrate gentle, double and single foot plyometric motions with good proximal form Baseline:  Goal status: INITIAL Week 12 07/18/22  5.   Pt will begin gradual return to yoga with understanding of modifications and precautions Baseline:  Goal status: INITIAL 05/18/23  6.  Pt will participate in dance classes Baseline:  Goal status: INITIAL 05/18/23    PLAN:  PT FREQUENCY: 1-2x/week  PT DURATION: 12 weeks  PLANNED INTERVENTIONS: Therapeutic exercises, Therapeutic activity, Neuromuscular re-education, Balance training, Gait training, Patient/Family education, Self Care, Joint mobilization, Stair training, Aquatic Therapy, Dry Needling, Electrical stimulation, Spinal mobilization, Cryotherapy, Moist heat, Taping, Traction, Ultrasound, Ionotophoresis 4mg /ml Dexamethasone, and Manual therapy  PLAN FOR NEXT SESSION: continue per protocol   Bristyl Mclees C. Nashon Erbes PT, DPT 05/05/22 1:59 PM

## 2022-05-05 ENCOUNTER — Encounter (HOSPITAL_BASED_OUTPATIENT_CLINIC_OR_DEPARTMENT_OTHER): Payer: BC Managed Care – PPO | Admitting: Physical Therapy

## 2022-05-05 ENCOUNTER — Ambulatory Visit (HOSPITAL_BASED_OUTPATIENT_CLINIC_OR_DEPARTMENT_OTHER): Payer: BC Managed Care – PPO | Admitting: Physical Therapy

## 2022-05-05 ENCOUNTER — Encounter (HOSPITAL_BASED_OUTPATIENT_CLINIC_OR_DEPARTMENT_OTHER): Payer: Self-pay | Admitting: Physical Therapy

## 2022-05-05 DIAGNOSIS — M25551 Pain in right hip: Secondary | ICD-10-CM

## 2022-05-05 DIAGNOSIS — M6281 Muscle weakness (generalized): Secondary | ICD-10-CM

## 2022-05-05 DIAGNOSIS — R262 Difficulty in walking, not elsewhere classified: Secondary | ICD-10-CM

## 2022-05-08 ENCOUNTER — Ambulatory Visit (INDEPENDENT_AMBULATORY_CARE_PROVIDER_SITE_OTHER): Payer: BC Managed Care – PPO | Admitting: Orthopaedic Surgery

## 2022-05-08 ENCOUNTER — Encounter (HOSPITAL_BASED_OUTPATIENT_CLINIC_OR_DEPARTMENT_OTHER): Payer: Self-pay | Admitting: Orthopaedic Surgery

## 2022-05-08 ENCOUNTER — Ambulatory Visit (INDEPENDENT_AMBULATORY_CARE_PROVIDER_SITE_OTHER): Payer: BC Managed Care – PPO | Admitting: Family Medicine

## 2022-05-08 DIAGNOSIS — K3184 Gastroparesis: Secondary | ICD-10-CM | POA: Diagnosis not present

## 2022-05-08 DIAGNOSIS — S73191D Other sprain of right hip, subsequent encounter: Secondary | ICD-10-CM

## 2022-05-08 NOTE — Progress Notes (Signed)
Post Operative Evaluation    Procedure/Date of Surgery: Right hip arthroscopy with labral reconstruction 12/5  Interval History:   05/08/2022: Brianna [name] presents today for first formal postop visit following right hip arthroscopic labral reconstruction.  Overall she is doing very well.  She has been compliant with weightbearing restrictions as well as brace restrictions.  She has been compliant with aspirin.  She endorses much less nausea and vomiting following this surgery compared to her previous surgery.  PMH/PSH/Family History/Social History/Meds/Allergies:    Past Medical History:  Diagnosis Date   Acute recurrent pansinusitis 10/21/2019   ADHD (attention deficit hyperactivity disorder)    inattentive   Anemia    had iron infusions in Fall of 2023   Anxiety    Asthma    Bronchitis    Cat allergy due to both airborne and skin contact 05/01/2016   Chronic sore throat 05/01/2016   COVID 2020   Depression    Fibromyalgia    pt denies   Gastroparesis    GERD (gastroesophageal reflux disease)    History of hypotension    taken midodrine in the past- Spring of 2022   Hypermobile Ehlers-Danlos syndrome    Irregular heart rate    Keratosis pilaris    Migraine    PONV (postoperative nausea and vomiting)    "I can get really cold"   PTSD (post-traumatic stress disorder)    Seasonal allergic rhinitis 03/11/2014   Past Surgical History:  Procedure Laterality Date   CHOLECYSTECTOMY  05/09/2021   ESOPHAGOGASTRODUODENOSCOPY ENDOSCOPY     03/2020   HIP ARTHROSCOPY Right 04/25/2022   Procedure: RIGHT HIP ARTHROSCOPY WITH LABRAL RECONSTRUCTION, ILIOTIBIAL BAND ALLOGRAFT, PSOAS RELEASE;  Surgeon: Vanetta Mulders, MD;  Location: [facility];  Service: Orthopedics;  Laterality: Right;   LABRAL REPAIR Right 03/02/2021   Procedure: RIGHT HIP ARTHROSCOPY WITH LABRAL REPAIR AND PINCE DEBRIDEMENT;  Surgeon: Vanetta Mulders, MD;  Location: [facility];  Service:  Orthopedics;  Laterality: Right;   SMART PILL PROCEDURE     12/2020   TYMPANOSTOMY TUBE PLACEMENT Bilateral    WISDOM TOOTH EXTRACTION     Social History   Socioeconomic History   Marital status: Single    Spouse name: Not on file   Number of children: Not on file   Years of education: Not on file   Highest education level: Not on file  Occupational History   Not on file  Tobacco Use   Smoking status: Former    Types: E-cigarettes, Cigarettes    Quit date: 2021    Years since quitting: 2.9   Smokeless tobacco: Never  Vaping Use   Vaping Use: Former   Substances: Editor, commissioning  Substance and Sexual Activity   Alcohol use: Not Currently   Drug use: Yes    Frequency: 7.0 times per week    Types: Marijuana   Sexual activity: Yes    Birth control/protection: None  Other Topics Concern   Not on file  Social History Narrative   Right handed   Social Determinants of Health   Financial Resource Strain: Not on file  Food Insecurity: Not on file  Transportation Needs: Not on file  Physical Activity: Not on file  Stress: Not on file  Social Connections: Not on file   Family History  Problem Relation Age of Onset  Diabetes Mother    Polycystic ovary syndrome Mother    GER disease Father    Heart disease Paternal Uncle    Allergies  Allergen Reactions   Clindamycin Hives, Swelling and Rash   Tretinoin Dermatitis, Itching, Photosensitivity, Swelling and Other (See Comments)   Current Outpatient Medications  Medication Sig Dispense Refill   aspirin EC 325 MG tablet Take 1 tablet (325 mg total) by mouth daily. 30 tablet 0   clobetasol (TEMOVATE) 0.05 % external solution Apply 1 Application topically daily as needed (Irritation).     clonazePAM (KLONOPIN) 1 MG tablet Take 1 mg by mouth daily as needed for anxiety.     Dextromethorphan-buPROPion ER (AUVELITY) 45-105 MG TBCR Take 1 tablet by mouth 2 (two) times daily.     Drospirenone (SLYND) 4 MG TABS Take 1  tablet by mouth daily. 28 tablet 11   finasteride (PROPECIA) 1 MG tablet Take 1 mg by mouth daily.     Galcanezumab-gnlm (EMGALITY) 120 MG/ML SOAJ Inject 120 mg into the skin every 30 (thirty) days.     loratadine (CLARITIN) 10 MG tablet Take 10 mg by mouth daily.     naratriptan (AMERGE) 2.5 MG tablet Take 2.5 mg by mouth as needed for migraine.     Omega-3 Fatty Acids (FISH OIL PO) Take 1,400 Units by mouth daily. Mini     ondansetron (ZOFRAN ODT) 4 MG disintegrating tablet Take 1 tablet (4 mg total) by mouth every 8 (eight) hours as needed for nausea or vomiting. 20 tablet 5   Pediatric Multiple Vitamins (PEDIATRIC MULTIVITAMIN) chewable tablet Chew 1 tablet by mouth daily.     promethazine (PHENERGAN) 25 MG tablet Take 1 tablet (25 mg total) by mouth every 6 (six) hours as needed for nausea or vomiting. (Patient taking differently: Take 12.5 mg by mouth every 6 (six) hours as needed for nausea or vomiting.) 30 tablet 0   tazarotene (TAZORAC) 0.05 % cream Apply 1 Application topically every other day.     tranexamic acid (LYSTEDA) 650 MG TABS tablet Take 2 tablets (1,300 mg total) by mouth 3 (three) times daily. Take during menses for a maximum of five days 30 tablet 2   triamcinolone cream (KENALOG) 0.1 % Apply 1 Application topically daily as needed (Eczma).     No current facility-administered medications for this visit.   No results found.  Review of Systems:   A ROS was performed including pertinent positives and negatives as documented in the HPI.   Musculoskeletal Exam:    Right hip incisions are well-appearing without erythema or drainage.  An anterior portal suture was left in place today.  Range of motion is to 20 degrees internal and external rotation of the hip with no pain.  Neurosensory exam is intact  Imaging:    None  I personally reviewed and interpreted the radiographs.   Assessment:   26 year old female status post right hip arthroscopic labral reconstruction  overall doing well.  At this time she will continue to advance according to the labral repair protocol.  I will see her back in 1 week and remove her mid anterior portal suture at that time. Plan :    -Return to clinic 1 week for removal of mid anterior portal suture    I personally saw and evaluated the patient, and participated in the management and treatment plan.  [name], MD Attending Physician, Orthopedic Surgery  This document was dictated using Dragon voice recognition software. A reasonable attempt at proof reading  has been made to minimize errors.

## 2022-05-08 NOTE — Progress Notes (Signed)
Telehealth Encounter  I connected with Brianna [name] (MRN [id number]) on 05/08/2022 by video, verified that I was speaking with the correct person using two identifiers, and that the patient was in a private environment conducive to confidentiality.  The patient agreed to proceed. PCP Maryruth Hancock, MD, [facility] Eating therapist Lyda Perone, MSW, LCSW at [facility] in [place] DBT therapist (in-person) Missy Alvie [facility] Alicia Amel, [place] at [facility] in [place]  Persons participating in visit were patient and provider (registered dietitian) [name], PhD, RD, LDN, CEDRD.  Provider was located at [facility] during this telehealth encounter; patient was at home.  Appt start time: 160 end time: 1700 (1 hour)   Reason for telehealth visit: Referred by Fredia Sorrow, PhD for Medical Nutrition Therapy related to IBS and gastroparesis, as well as food anxiety, and difficulty eating.  She wants to reverse the weight loss related to the above.  She weighed 116 lb in March 2022.  Relevant history/background: Brianna [name] has a history of disordered eating, which has fluctuated between restriction and bingeing as early as elementary school. GI issues started at around age 33, and got especially bad in 2020. Gastroparesis was diagnosed June 2022, with no identified cause.  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  Her only COVID symptoms were GI-related.  In high school, Brianna [name] self-injured, which she did again in 2021, as she tried to cope with stress.  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    Assessment: Brianna [name] had her R hip arthroscopy with labral reconstruction on 04/25/22, very soon after which she started PT.  Post-op visit today with  Brianna Cote, MD suggested recovery is normal.  She has been staying at her mom's since the surgery, and anticipates being there for 2-3 more weeks.  Food choices have been quite good, much better than her last surgery recovery b/c GI function has been better, and her mom has made sure to keep on hand requested foods.  Has taken a laxative 3-4 times in the past two weeks to help with constipation.  Foods Mariavictoria identified as being helpful to have on hand include: Chobani yogurt, cereal, soy milk, old fashioned oats, canned fruit, Jell-O, mashed sweet potatoes, lactose-free cottage cheese, butternut squash soup, crackers, white rice, canned salmon, grits & eggs, ripe bananas, sorbet, veg soup. Still doing Invisalign orthodontic treatment, and struggling less with timing of meals.    Weight: 106 lb on 12/5 (pre-surgery).  (Ht is 64".Brianna [name] weighed herself twice since surgery, which indicated a weight of 102 lb.   Usual eating pattern:  small meals and 2-3 snacks per day. Usual physical activity: Only PT exercises several times a week. Sleep: Getting 5-6 hrs per night of interrupted sleep.  Night sweating has been severe since October. 24-hr recall:  (Up at 11 AM; drank some water first thing and throughout the day) B ( AM)-  --- Snk ( AM)-  --- L (11:30 PM)-  1 1/2 c pesto pasta w/ chx, water, 12 oz Body Armor drink (120 kcal) Snk (4 PM)-  1 c pasta salad w/ salmon, 1/2 c berries, whipped cream, water D (7 PM)-  1 c crockpot chx with carrots, 1 small bagette, 1/2 c low-fat cottage chs Snk (8:30)-  1 small bag salt&vinegar chips, dairy-free frozen dessert (100 kcal)  Typical day? No.  Slept unusually late; usually has a protein drink for bkfast and lunch ~1 PM.       Intervention: Reviewed diet and recent medical history, and modified recommended eating schedule, which Brianna [name] identified as helpful for staying on track with obtaining good nutrition.    For recommendations and goals, see  Patient Instructions.    Follow-up: Remote appt in 6 weeks.  Rosalio Catterton,JEANNIE

## 2022-05-08 NOTE — Patient Instructions (Addendum)
-   Nutrition priority right now: Eat enough to gain back the weight you have lost (which will also help the healing process).    - Remember to focus most on listening to your body, and on getting both protein and carb distributed throughout the day, with a source of protein at each MEAL.  Advanced planning will be helpful to making this happen. i.e., talk to your mom about meals as well as meal times.    A consistent eating schedule will be especially helpful:   Meal    No later than Bkfst      9:30 Snack     --- Lunch  12:00 Snack    3:00 Dinner    6:30 PM Snack    7:30 - optional    Follow-up remote appt on Monday, Jan 29 at 3 PM.

## 2022-05-10 DIAGNOSIS — F411 Generalized anxiety disorder: Secondary | ICD-10-CM | POA: Diagnosis not present

## 2022-05-11 NOTE — Therapy (Signed)
OUTPATIENT PHYSICAL THERAPY LOWER EXTREMITY TREATMENT   Patient Name: Brianna [name] MRN: [id number] DOB:Jun 02, 1995, 26 y.o., female Today's Date: 05/12/2022  END OF SESSION:  PT End of Session - 05/12/22 0933     Visit Number 2    Number of Visits 25    Date for PT Re-Evaluation 07/21/22    Authorization Type BCBS    PT Start Time 0930    PT Stop Time 1010    PT Time Calculation (min) 40 min    Activity Tolerance Patient tolerated treatment well    Behavior During Therapy [facility] for tasks assessed/performed               Past Medical History:  Diagnosis Date   Acute recurrent pansinusitis 10/21/2019   ADHD (attention deficit hyperactivity disorder)    inattentive   Anemia    had iron infusions in Fall of 2023   Anxiety    Asthma    Bronchitis    Cat allergy due to both airborne and skin contact 05/01/2016   Chronic sore throat 05/01/2016   COVID 2020   Depression    Fibromyalgia    pt denies   Gastroparesis    GERD (gastroesophageal reflux disease)    History of hypotension    taken midodrine in the past- Spring of 2022   Hypermobile Ehlers-Danlos syndrome    Irregular heart rate    Keratosis pilaris    Migraine    PONV (postoperative nausea and vomiting)    "I can get really cold"   PTSD (post-traumatic stress disorder)    Seasonal allergic rhinitis 03/11/2014   Past Surgical History:  Procedure Laterality Date   CHOLECYSTECTOMY  05/09/2021   ESOPHAGOGASTRODUODENOSCOPY ENDOSCOPY     03/2020   HIP ARTHROSCOPY Right 04/25/2022   Procedure: RIGHT HIP ARTHROSCOPY WITH LABRAL RECONSTRUCTION, ILIOTIBIAL BAND ALLOGRAFT, PSOAS RELEASE;  Surgeon: [name], MD;  Location: [facility] OR;  Service: Orthopedics;  Laterality: Right;   LABRAL REPAIR Right 03/02/2021   Procedure: RIGHT HIP ARTHROSCOPY WITH LABRAL REPAIR AND PINCE DEBRIDEMENT;  Surgeon: [name], MD;  Location: [facility] OR;  Service: Orthopedics;  Laterality: Right;   SMART PILL PROCEDURE      12/2020   TYMPANOSTOMY TUBE PLACEMENT Bilateral    WISDOM TOOTH EXTRACTION     Patient Active Problem List   Diagnosis Date Noted   Cervical adenopathy 06/10/2021   Chronic pruritus 06/10/2021   Exercise-induced asthma 06/09/2021   Chronic venous insufficiency 06/03/2021   Orthostatic lightheadedness 06/03/2021   Purple toe syndrome of both feet (HCC) 06/03/2021   Autonomic dysfunction 06/03/2021   Tear of right acetabular labrum 04/13/2021   Migraine headache 03/01/2021   Headache 11/30/2020   Positive ANA (antinuclear antibody) 11/15/2020   Fibromyalgia 11/02/2020   Hair loss 11/02/2020   Muscle spasticity 10/22/2020   Hypermobile Ehlers-Danlos syndrome 10/22/2020   Paresthesia of bilateral legs 10/06/2020   Myalgia 10/06/2020   Chronic pelvic pain in female 10/06/2020   Chronic abdominal pain 10/06/2020   Irritable colon 08/02/2020   Pelvic floor dysfunction 08/02/2020   Irritant dermatitis 08/02/2020   Gastroesophageal reflux disease 06/28/2020   Severe recurrent major depression without psychotic features (HCC) 06/28/2020   Polyarthralgia 06/28/2020   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 06/28/2020   Tachycardia 06/28/2020   History of COVID-19 06/28/2020   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 deficit 06/28/2020   Recurrent major depressive disorder, in full remission (HCC) 06/28/2020   Atypical chest pain 06/22/2020   Dizziness 06/22/2020  Palpitations 06/14/2020   Anxiety 05/04/2020   Internal and external hemorrhoids without complication 04/29/2020   Nondiabetic gastroparesis 03/03/2020   ETD (Eustachian tube dysfunction), bilateral 10/21/2019   Acne vulgaris 03/11/2014   Mild intermittent asthma without complication 03/11/2014   Seasonal allergies 03/11/2014    REFERRING PROVIDER:  [name], MD     REFERRING DIAG: [phone] (ICD-10-CM) - Tear of right acetabular labrum, subsequent encounter  post op 04/25/22 R hip  labral repair revision with IT band allograft   THERAPY DIAG:  Pain in right hip  Muscle weakness (generalized)  Difficulty in walking, not elsewhere classified  Rationale for Evaluation and Treatment: Rehabilitation  ONSET DATE: DOS 04/25/22  SUBJECTIVE:   SUBJECTIVE STATEMENT: A little pnching on the front of the hip.   PERTINENT HISTORY: EDS PAIN:  Are you having pain? Yes: NPRS scale: 4/10 Pain location: rt hip Pain description: sore Aggravating factors: movement Relieving factors: rest  PRECAUTIONS: Other: TDWB 2 weeks post op  WEIGHT BEARING RESTRICTIONS: TDWB 2 wk post op  FALLS:  Has patient fallen in last 6 months? 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  PLOF: Independent  PATIENT GOALS: yoga, take dance classes  OBJECTIVE:   PATIENT SURVEYS:  FOTO 27  POSTURE:flexed on crutches as expected  PALPATION: End ranges WFL  LOWER EXTREMITY ROM:  Passive ROM Right eval   Hip flexion 80   Hip extension 0   Hip abduction 30   Hip adduction    Hip internal rotation    Hip external rotation    Knee flexion    Knee extension    Ankle dorsiflexion    Ankle plantarflexion    Ankle inversion    Ankle eversion     (Blank rows = not tested)   GAIT: EVAL: TDWB with bil axillary crutches   TODAY'S TREATMENT:                                                                                                                               Treatment                            05/12/22:  PROM flexion, abd ER/IR LAD gentle paired with abd/add motion   Treatment                            05/05/22:  PROM flexion & abd, paired with distraction Roller to Rt quads Hooklying bent knee fall outs with trA Bike no resist   Treatment                            EVAL 04/28/22:  PROM & taught mom to do so Sidelying hip abd & clams   PATIENT EDUCATION:  Education details: Anatomy of condition, POC, HEP, exercise form/rationale Person educated: Patient and  Parent  Education method: Explanation, Demonstration, Tactile cues, Verbal cues, and Handouts Education comprehension: verbalized understanding, returned demonstration, verbal cues required, tactile cues required, and needs further education  HOME EXERCISE PROGRAM: PROM by Mom [id number]  ASSESSMENT:  CLINICAL IMPRESSION: Pt reported manual therapy felt really good and denied pinching. Will work on glut activation in gait as well as approx 25% WB with respect to pain levels.   OBJECTIVE IMPAIRMENTS: Abnormal gait, decreased activity tolerance, decreased balance, difficulty walking, decreased ROM, decreased strength, increased muscle spasms, impaired flexibility, improper body mechanics, and pain.   ACTIVITY LIMITATIONS: carrying, lifting, bending, sitting, standing, squatting, stairs, transfers, bed mobility, bathing, dressing, and locomotion level  PARTICIPATION LIMITATIONS: meal prep, cleaning, medication management, driving, shopping, community activity, and occupation  PERSONAL FACTORS: 3+ comorbidities: previous labral repair, EDS, autonomic dysfunction  are also affecting patient's functional outcome.     GOALS: Goals reviewed with patient? Yes  SHORT TERM GOALS: Target date: 05/26/22 30s STS without pain Baseline: Goal status: INITIAL  2.  Able to demo household ambulation without AD and with proper pattern Baseline:  Goal status: INITIAL    LONG TERM GOALS:   Able to demo step up on 6" step with level pelvis and proper form Baseline:  Goal status: 06/20/22  2.  Will tolerate at least 3 min on elliptical, demonstrating good tolerance to repetitive weight bearing motion Baseline:  Goal status: INITIAL Week 8 06/20/22  3.  Demonstrate proper form in at least 10 continuous lunges without increased pain Baseline:  Goal status: INITIAL Week 12 07/18/22  4.  Demonstrate gentle, double and single foot plyometric motions with good proximal form Baseline:  Goal status:  INITIAL Week 12 07/18/22  5.  Pt will begin gradual return to yoga with understanding of modifications and precautions Baseline:  Goal status: INITIAL 05/18/23  6.  Pt will participate in dance classes Baseline:  Goal status: INITIAL 05/18/23    PLAN:  PT FREQUENCY: 1-2x/week  PT DURATION: 12 weeks  PLANNED INTERVENTIONS: Therapeutic exercises, Therapeutic activity, Neuromuscular re-education, Balance training, Gait training, Patient/Family education, Self Care, Joint mobilization, Stair training, Aquatic Therapy, Dry Needling, Electrical stimulation, Spinal mobilization, Cryotherapy, Moist heat, Taping, Traction, Ultrasound, Ionotophoresis 4mg /ml Dexamethasone, and Manual therapy  PLAN FOR NEXT SESSION: continue per protocol   Evelean Bigler C. Desarae Placide PT, DPT 05/12/22 10:11 AM

## 2022-05-12 ENCOUNTER — Ambulatory Visit (HOSPITAL_BASED_OUTPATIENT_CLINIC_OR_DEPARTMENT_OTHER): Payer: BC Managed Care – PPO | Admitting: Physical Therapy

## 2022-05-12 ENCOUNTER — Encounter (HOSPITAL_BASED_OUTPATIENT_CLINIC_OR_DEPARTMENT_OTHER): Payer: Self-pay | Admitting: Physical Therapy

## 2022-05-12 ENCOUNTER — Encounter (HOSPITAL_BASED_OUTPATIENT_CLINIC_OR_DEPARTMENT_OTHER): Payer: BC Managed Care – PPO | Admitting: Physical Therapy

## 2022-05-12 DIAGNOSIS — R262 Difficulty in walking, not elsewhere classified: Secondary | ICD-10-CM

## 2022-05-12 DIAGNOSIS — M6281 Muscle weakness (generalized): Secondary | ICD-10-CM

## 2022-05-12 DIAGNOSIS — M25551 Pain in right hip: Secondary | ICD-10-CM

## 2022-05-17 ENCOUNTER — Encounter (HOSPITAL_BASED_OUTPATIENT_CLINIC_OR_DEPARTMENT_OTHER): Payer: BC Managed Care – PPO | Admitting: Orthopaedic Surgery

## 2022-05-17 ENCOUNTER — Telehealth (INDEPENDENT_AMBULATORY_CARE_PROVIDER_SITE_OTHER): Payer: BC Managed Care – PPO | Admitting: Obstetrics and Gynecology

## 2022-05-17 ENCOUNTER — Encounter: Payer: Self-pay | Admitting: Obstetrics and Gynecology

## 2022-05-17 ENCOUNTER — Other Ambulatory Visit (HOSPITAL_BASED_OUTPATIENT_CLINIC_OR_DEPARTMENT_OTHER): Payer: Self-pay | Admitting: Orthopaedic Surgery

## 2022-05-17 ENCOUNTER — Encounter (HOSPITAL_BASED_OUTPATIENT_CLINIC_OR_DEPARTMENT_OTHER): Payer: Self-pay | Admitting: Orthopaedic Surgery

## 2022-05-17 DIAGNOSIS — R61 Generalized hyperhidrosis: Secondary | ICD-10-CM | POA: Diagnosis not present

## 2022-05-17 DIAGNOSIS — J111 Influenza due to unidentified influenza virus with other respiratory manifestations: Secondary | ICD-10-CM | POA: Diagnosis not present

## 2022-05-17 DIAGNOSIS — Z13220 Encounter for screening for lipoid disorders: Secondary | ICD-10-CM

## 2022-05-17 MED ORDER — OSELTAMIVIR PHOSPHATE 75 MG PO CAPS: 75.0000 mg | ORAL_CAPSULE | Freq: Two times a day (BID) | ORAL | 0 refills | Status: AC

## 2022-05-17 NOTE — Progress Notes (Signed)
GYNECOLOGY VIRTUAL VISIT ENCOUNTER NOTE  Provider location: [facility] for [facility] [place]   Patient location: Home  I connected with Brianna [name] on 05/17/22 at 11:00 AM EST by MyChart Video Encounter and verified that I am speaking with the correct person using two identifiers.   I discussed the limitations, risks, security and privacy concerns of performing an evaluation and management service virtually and the availability of in person appointments. I also discussed with the patient that there may be a patient responsible charge related to this service. The patient expressed understanding and agreed to proceed.   History:  Brianna [name] is a 26 y.o. [id number] female being evaluated today for night sweats. They have been since about mid-October. They were nightly. She has not had them in the last week. At first she thought they might be from stress around her hip surgery and working full time and goin to school, but then they kept up for 2 weeks following her surgery. She is now 3 weeks from the hip surgery. She is recovering at her mom's house until her mobility improves.   She denies other associated symptoms. She did start the [facility] around that time. She has been amenorrheic on it. She doesn't feel like it is yet helping her skin or hair loss. She has already seen a derm in the last year and tried a variety of things through them.   She denies any other associated symptoms.   Also of note, she doesn't feel well today - has the flu she thinks. Started yesterday. Has been sleeping poorly. Has fever/nausea, bowel sympotms.      Past Medical History:  Diagnosis Date   Acute recurrent pansinusitis 10/21/2019   ADHD (attention deficit hyperactivity disorder)    inattentive   Anemia    had iron infusions in Fall of 2023   Anxiety    Asthma    Bronchitis    Cat allergy due to both airborne and skin contact 05/01/2016   Chronic sore throat 05/01/2016    COVID 2020   Depression    Fibromyalgia    pt denies   Gastroparesis    GERD (gastroesophageal reflux disease)    History of hypotension    taken midodrine in the past- Spring of 2022   Hypermobile Ehlers-Danlos syndrome    Irregular heart rate    Keratosis pilaris    Migraine    PONV (postoperative nausea and vomiting)    "I can get really cold"   PTSD (post-traumatic stress disorder)    Seasonal allergic rhinitis 03/11/2014   Past Surgical History:  Procedure Laterality Date   CHOLECYSTECTOMY  05/09/2021   ESOPHAGOGASTRODUODENOSCOPY ENDOSCOPY     03/2020   HIP ARTHROSCOPY Right 04/25/2022   Procedure: RIGHT HIP ARTHROSCOPY WITH LABRAL RECONSTRUCTION, ILIOTIBIAL BAND ALLOGRAFT, PSOAS RELEASE;  Surgeon: [name], MD;  Location: [facility] OR;  Service: Orthopedics;  Laterality: Right;   LABRAL REPAIR Right 03/02/2021   Procedure: RIGHT HIP ARTHROSCOPY WITH LABRAL REPAIR AND PINCE DEBRIDEMENT;  Surgeon: [name], MD;  Location: [facility] OR;  Service: Orthopedics;  Laterality: Right;   SMART PILL PROCEDURE     12/2020   TYMPANOSTOMY TUBE PLACEMENT Bilateral    WISDOM TOOTH EXTRACTION     The following portions of the patient's history were reviewed and updated as appropriate: allergies, current medications, past family history, past medical history, past social history, past surgical history and problem list.   Review of Systems:  Pertinent items noted in HPI and  remainder of comprehensive ROS otherwise negative.  Physical Exam:   General:  Alert, oriented and cooperative. Patient appears to be in no acute distress but appears unwell.   Mental Status: Normal mood and affect. Normal behavior. Normal judgment and thought content.   Respiratory: Normal respiratory effort, no problems with respiration noted  Rest of physical exam deferred due to type of encounter    Assessment and Plan:     1. Influenza Offered Tamiflu for possible Flu - she would like to try. If not the flu,  reviewed their is little downside, but if it is the flu it would shorten the course or make it less severe.  - oseltamivir (TAMIFLU) 75 MG capsule; Take 1 capsule (75 mg total) by mouth 2 (two) times daily for 5 days.  Dispense: 10 capsule; Refill: 0  2. Night sweats Could be side effect of Slynd but we will check routine labs for this. Reviewed cortisol is best when fasting.  - TSH - Cortisol - Estradiol - FSH  3. Screening for lipid disorders Since she will be fasting and no recent lipid profile, I offered lipid panel. She accepts.  - Lipid Profile       I discussed the assessment and treatment plan with the patient. The patient was provided an opportunity to ask questions and all were answered. The patient agreed with the plan and demonstrated an understanding of the instructions.   The patient was advised to call back or seek an in-person evaluation/go to the ED if the symptoms worsen or if the condition fails to improve as anticipated.  I provided 15 minutes of face-to-face time during this encounter.   Milas Hock, MD Center for [facility], [facility]

## 2022-05-19 ENCOUNTER — Encounter (HOSPITAL_BASED_OUTPATIENT_CLINIC_OR_DEPARTMENT_OTHER): Payer: Self-pay

## 2022-05-19 ENCOUNTER — Encounter (HOSPITAL_BASED_OUTPATIENT_CLINIC_OR_DEPARTMENT_OTHER): Payer: BC Managed Care – PPO | Admitting: Physical Therapy

## 2022-05-19 ENCOUNTER — Ambulatory Visit
Admission: EM | Admit: 2022-05-19 | Discharge: 2022-05-19 | Discharge status: Against Medical Advice | Payer: BC Managed Care – PPO

## 2022-05-20 ENCOUNTER — Encounter (HOSPITAL_BASED_OUTPATIENT_CLINIC_OR_DEPARTMENT_OTHER): Payer: Self-pay | Admitting: Physical Therapy

## 2022-05-20 ENCOUNTER — Other Ambulatory Visit: Payer: Self-pay

## 2022-05-20 ENCOUNTER — Ambulatory Visit
Admission: EM | Admit: 2022-05-20 | Discharge: 2022-05-20 | Disposition: A | Discharge status: Home / Self Care | Payer: BC Managed Care – PPO | Attending: Family Medicine | Admitting: Family Medicine

## 2022-05-20 VITALS — BP 107/74 | HR 92 | Temp 98.2°F | Resp 16

## 2022-05-20 DIAGNOSIS — J0100 Acute maxillary sinusitis, unspecified: Secondary | ICD-10-CM | POA: Diagnosis not present

## 2022-05-20 DIAGNOSIS — R059 Cough, unspecified: Secondary | ICD-10-CM

## 2022-05-20 MED ORDER — AMOXICILLIN 875 MG PO TABS: 875.0000 mg | ORAL_TABLET | Freq: Two times a day (BID) | ORAL | 0 refills | Status: AC

## 2022-05-20 MED ORDER — BENZONATATE 200 MG PO CAPS: 200.0000 mg | ORAL_CAPSULE | Freq: Three times a day (TID) | ORAL | 0 refills | Status: AC | PRN

## 2022-05-20 NOTE — ED Provider Notes (Signed)
[facility]    CSN: [id number] Arrival date & time: 05/20/22  1040      History   Chief Complaint Chief Complaint  Patient presents with   Cough    Entered by patient    HPI Brianna [name] is a 26 y.o. female.   HPI last 4 days she has been having cough, diarrhea, sore throat and bodyaches patient reports negative COVID-19 test.  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  Past Medical History:  Diagnosis Date   Acute recurrent pansinusitis 10/21/2019   ADHD (attention deficit hyperactivity disorder)    inattentive   Anemia    had iron infusions in Fall of 2023   Anxiety    Asthma    Bronchitis    Cat allergy due to both airborne and skin contact 05/01/2016   Chronic sore throat 05/01/2016   COVID 2020   Depression    Fibromyalgia    pt denies   Gastroparesis    GERD (gastroesophageal reflux disease)    History of hypotension    taken midodrine in the past- Spring of 2022   Hypermobile Ehlers-Danlos syndrome    Irregular heart rate    Keratosis pilaris    Migraine    PONV (postoperative nausea and vomiting)    "I can get really cold"   PTSD (post-traumatic stress disorder)    Seasonal allergic rhinitis 03/11/2014    Patient Active Problem List   Diagnosis Date Noted   Cervical adenopathy 06/10/2021   Chronic pruritus 06/10/2021   Exercise-induced asthma 06/09/2021   Chronic venous insufficiency 06/03/2021   Orthostatic lightheadedness 06/03/2021   Purple toe syndrome of both feet (HCC) 06/03/2021   Autonomic dysfunction 06/03/2021   Tear of right acetabular labrum 04/13/2021   Migraine headache 03/01/2021   Headache 11/30/2020   Positive ANA (antinuclear antibody) 11/15/2020   Fibromyalgia 11/02/2020   Hair loss 11/02/2020   Muscle spasticity 10/22/2020   Hypermobile Ehlers-Danlos syndrome 10/22/2020   Paresthesia of  bilateral legs 10/06/2020   Myalgia 10/06/2020   Chronic pelvic pain in female 10/06/2020   Chronic abdominal pain 10/06/2020   Irritable colon 08/02/2020   Pelvic floor dysfunction 08/02/2020   Irritant dermatitis 08/02/2020   Gastroesophageal reflux disease 06/28/2020   Severe recurrent major depression without psychotic features (HCC) 06/28/2020   Polyarthralgia 06/28/2020   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 06/28/2020   Tachycardia 06/28/2020   History of COVID-19 06/28/2020   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 deficit 06/28/2020   Recurrent major depressive disorder, in full remission (HCC) 06/28/2020   Atypical chest pain 06/22/2020   Dizziness 06/22/2020   Palpitations 06/14/2020   Anxiety 05/04/2020   Internal and external hemorrhoids without complication 04/29/2020   Nondiabetic gastroparesis 03/03/2020   ETD (Eustachian tube dysfunction), bilateral 10/21/2019   Acne vulgaris 03/11/2014   Mild intermittent asthma without complication 03/11/2014   Seasonal allergies 03/11/2014    Past Surgical History:  Procedure Laterality Date   CHOLECYSTECTOMY  05/09/2021   ESOPHAGOGASTRODUODENOSCOPY ENDOSCOPY     03/2020   HIP ARTHROSCOPY Right 04/25/2022   Procedure: RIGHT HIP ARTHROSCOPY WITH LABRAL RECONSTRUCTION, ILIOTIBIAL BAND ALLOGRAFT, PSOAS RELEASE;  Surgeon: [name], MD;  Location: [facility];  Service: Orthopedics;  Laterality: Right;   LABRAL REPAIR Right 03/02/2021   Procedure: RIGHT HIP ARTHROSCOPY WITH LABRAL REPAIR AND PINCE DEBRIDEMENT;  Surgeon: [name], MD;  Location: [facility] OR;  Service: Orthopedics;  Laterality: Right;  SMART PILL PROCEDURE     12/2020   TYMPANOSTOMY TUBE PLACEMENT Bilateral    WISDOM TOOTH EXTRACTION      OB History     Gravida  1   Para  0   Term      Preterm  0   AB  1   Living  0      SAB      IAB  1   Ectopic  0   Multiple      Live Births  0            Home  Medications    Prior to Admission medications   Medication Sig Start Date End Date Taking? Authorizing Provider  amoxicillin (AMOXIL) 875 MG tablet Take 1 tablet (875 mg total) by mouth 2 (two) times daily for 7 days. 05/20/22 05/27/22 Yes Trevor Iha, FNP  benzonatate (TESSALON) 200 MG capsule Take 1 capsule (200 mg total) by mouth 3 (three) times daily as needed for up to 7 days. 05/20/22 05/27/22 Yes Trevor Iha, FNP  aspirin EC 325 MG tablet TAKE 1 TABLET BY MOUTH EVERY DAY 05/17/22   [name], MD  clobetasol (TEMOVATE) 0.05 % external solution Apply 1 Application topically daily as needed (Irritation).    [provider]  clonazePAM (KLONOPIN) 1 MG tablet Take 1 mg by mouth daily as needed for anxiety. 01/27/22   [provider]  Dextromethorphan-buPROPion ER (AUVELITY) 45-105 MG TBCR Take 1 tablet by mouth 2 (two) times daily.    [provider]  Drospirenone (SLYND) 4 MG TABS Take 1 tablet by mouth daily. 02/09/22   Milas Hock, MD  finasteride (PROPECIA) 1 MG tablet Take 1 mg by mouth daily. 01/06/22   [provider]  Galcanezumab-gnlm (EMGALITY) 120 MG/ML SOAJ Inject 120 mg into the skin every 30 (thirty) days. 11/17/21   [provider]  loratadine (CLARITIN) 10 MG tablet Take 10 mg by mouth daily.    [provider]  naratriptan (AMERGE) 2.5 MG tablet Take 2.5 mg by mouth as needed for migraine. 08/05/21   [provider]  Omega-3 Fatty Acids (FISH OIL PO) Take 1,400 Units by mouth daily. Mini    [provider]  ondansetron (ZOFRAN ODT) 4 MG disintegrating tablet Take 1 tablet (4 mg total) by mouth every 8 (eight) hours as needed for nausea or vomiting. 11/30/20   Drema Dallas, DO  oseltamivir (TAMIFLU) 75 MG capsule Take 1 capsule (75 mg total) by mouth 2 (two) times daily for 5 days. 05/17/22 05/22/22  Milas Hock, MD  Pediatric Multiple Vitamins (PEDIATRIC MULTIVITAMIN) chewable tablet Chew 1 tablet by  mouth daily.    [provider]  promethazine (PHENERGAN) 25 MG tablet Take 1 tablet (25 mg total) by mouth every 6 (six) hours as needed for nausea or vomiting. Patient taking differently: Take 12.5 mg by mouth every 6 (six) hours as needed for nausea or vomiting. 03/08/21   Mannie Stabile, PA-C  tazarotene (TAZORAC) 0.05 % cream Apply 1 Application topically every other day.    [provider]  tranexamic acid (LYSTEDA) 650 MG TABS tablet Take 2 tablets (1,300 mg total) by mouth 3 (three) times daily. Take during menses for a maximum of five days 03/15/22   Milas Hock, MD  triamcinolone cream (KENALOG) 0.1 % Apply 1 Application topically daily as needed Jefferey Pica).    [provider]    Family History Family History  Problem Relation Age of Onset  Diabetes Mother    Polycystic ovary syndrome Mother    GER disease Father    Heart disease Paternal Uncle     Social History Social History   Tobacco Use   Smoking status: Former    Types: E-cigarettes, Cigarettes    Quit date: 2021    Years since quitting: 2.9   Smokeless tobacco: Never  Vaping Use   Vaping Use: Former   Substances: Financial trader  Substance Use Topics   Alcohol use: Not Currently   Drug use: Yes    Frequency: 7.0 times per week    Types: Marijuana     Allergies   Clindamycin and Tretinoin   Review of Systems Review of Systems  HENT:  Positive for sore throat.   Respiratory:  Positive for cough.   Gastrointestinal:  Positive for diarrhea and nausea.  All other systems reviewed and are negative.    Physical Exam Triage Vital Signs ED Triage Vitals  Enc Vitals Group     BP 05/20/22 1122 107/74     Pulse Rate 05/20/22 1122 92     Resp 05/20/22 1122 16     Temp 05/20/22 1122 98.2 F (36.8 C)     Temp Source 05/20/22 1122 Oral     SpO2 05/20/22 1122 99 %     Weight --      Height --      Head Circumference --      Peak Flow --      Pain Score 05/20/22  1124 0     Pain Loc --      Pain Edu? --      Excl. in GC? --    No data found.  Updated Vital Signs BP 107/74 (BP Location: Right Arm)   Pulse 92   Temp 98.2 F (36.8 C) (Oral)   Resp 16   SpO2 99%   Visual Acuity Right Eye Distance:   Left Eye Distance:   Bilateral Distance:    Right Eye Near:   Left Eye Near:    Bilateral Near:     Physical Exam Vitals and nursing note reviewed.  Constitutional:      Appearance: Normal appearance. She is normal weight.  HENT:     Head: Normocephalic and atraumatic.     Right Ear: Tympanic membrane and external ear normal.     Left Ear: Tympanic membrane and external ear normal.     Ears:     Comments: Moderate eustachian tube dysfunction noted bilaterally    Mouth/Throat:     Mouth: Mucous membranes are moist.     Pharynx: Oropharynx is clear.     Comments: Moderate amount of clear drainage of posterior oropharynx noted Eyes:     Extraocular Movements: Extraocular movements intact.     Conjunctiva/sclera: Conjunctivae normal.     Pupils: Pupils are equal, round, and reactive to light.  Cardiovascular:     Rate and Rhythm: Normal rate and regular rhythm.     Pulses: Normal pulses.     Heart sounds: Normal heart sounds. No murmur heard. Pulmonary:     Effort: Pulmonary effort is normal.     Breath sounds: Normal breath sounds. No wheezing, rhonchi or rales.     Comments: Infrequent nonproductive cough noted on exam Musculoskeletal:        General: Normal range of motion.     Cervical back: Normal range of motion and neck supple.  Skin:    General: Skin is warm and dry.  Neurological:     General: No focal deficit present.     Mental Status: She is alert and oriented to person, place, and time. Mental status is at baseline.      UC Treatments / Results  Labs (all labs ordered are listed, but only abnormal results are displayed) Labs Reviewed - No data to display  EKG   Radiology No results  found.  Procedures Procedures (including critical care time)  Medications Ordered in UC Medications - No data to display  Initial Impression / Assessment and Plan / UC Course  I have reviewed the triage vital signs and the nursing notes.  Pertinent labs & imaging results that were available during my care of the patient were reviewed by me and considered in my medical decision making (see chart for details).     MDM: 1.  Subacute maxillary sinusitis-Rx'd amoxicillin; 2.  Cough-Rx'd Tessalon Perles. Advised/encouraged patient to hold this medication for the next 5 to 6 days if symptoms worsen may start medication.  Advised if starting amoxicillin please take to completion.  Advised may use Tessalon Perles daily or as needed for cough.  Encouraged patient increase daily water intake to 64 ounces per day while taking this medication.  Advised if symptoms worsen and/or unresolved please follow-up with PCP for further evaluation.  Patient discharged home, hemodynamically stable. Final Clinical Impressions(s) / UC Diagnoses   Final diagnoses:  Subacute maxillary sinusitis  Cough, unspecified type     Discharge Instructions      Advised/encouraged patient to hold this medication for the next 5 to 6 days if symptoms worsen may start medication.  Advised if starting amoxicillin please take to completion.  Advised may use Tessalon Perles daily or as needed for cough.  Encouraged patient increase daily water intake to 64 ounces per day while taking this medication.  Advised if symptoms worsen and/or unresolved please follow-up with PCP for further evaluation.     ED Prescriptions     Medication Sig Dispense Auth. Provider   amoxicillin (AMOXIL) 875 MG tablet Take 1 tablet (875 mg total) by mouth 2 (two) times daily for 7 days. 14 tablet Trevor Ihaagan, Nancy Arvin, FNP   benzonatate (TESSALON) 200 MG capsule Take 1 capsule (200 mg total) by mouth 3 (three) times daily as needed for up to 7 days. 40  capsule Trevor Ihaagan, Kristan Votta, FNP      PDMP not reviewed this encounter.   Trevor IhaRagan, Ossie Yebra, FNP 05/20/22 1234

## 2022-05-20 NOTE — ED Triage Notes (Signed)
Pt states for the last 4 days she has been having cough, diarrhea, sore throat and body aches. Negative covid test and unsure about fever.

## 2022-05-20 NOTE — Discharge Instructions (Addendum)
Advised/encouraged patient to hold this medication for the next 5 to 6 days if symptoms worsen may start medication.  Advised if starting amoxicillin please take to completion.  Advised may use Tessalon Perles daily or as needed for cough.  Encouraged patient increase daily water intake to 64 ounces per day while taking this medication.  Advised if symptoms worsen and/or unresolved please follow-up with PCP for further evaluation.

## 2022-05-23 ENCOUNTER — Encounter (HOSPITAL_BASED_OUTPATIENT_CLINIC_OR_DEPARTMENT_OTHER): Payer: Self-pay | Admitting: Physical Therapy

## 2022-05-23 ENCOUNTER — Ambulatory Visit (HOSPITAL_BASED_OUTPATIENT_CLINIC_OR_DEPARTMENT_OTHER): Payer: BC Managed Care – PPO | Attending: Orthopaedic Surgery | Admitting: Physical Therapy

## 2022-05-23 DIAGNOSIS — R262 Difficulty in walking, not elsewhere classified: Secondary | ICD-10-CM | POA: Diagnosis present

## 2022-05-23 DIAGNOSIS — M25551 Pain in right hip: Secondary | ICD-10-CM | POA: Diagnosis present

## 2022-05-23 DIAGNOSIS — M6281 Muscle weakness (generalized): Secondary | ICD-10-CM | POA: Diagnosis present

## 2022-05-23 NOTE — Therapy (Signed)
OUTPATIENT PHYSICAL THERAPY LOWER EXTREMITY TREATMENT   Patient Name: Brianna [name] MRN: [id number] DOB:[birth year], 27 y.o., female Today's Date: 05/23/2022  END OF SESSION:  PT End of Session - 05/23/22 1021     Visit Number 4    Number of Visits 25    Date for PT Re-Evaluation 07/21/22    Authorization Type [employer]    PT Start Time 1020    PT Stop Time 1100    PT Time Calculation (min) 40 min    Activity Tolerance Patient tolerated treatment well    Behavior During Therapy [facility] for tasks assessed/performed               Past Medical History:  Diagnosis Date   Acute recurrent pansinusitis 10/21/2019   ADHD (attention deficit hyperactivity disorder)    inattentive   Anemia    had iron infusions in Fall of 2023   Anxiety    Asthma    Bronchitis    Cat allergy due to both airborne and skin contact 05/01/2016   Chronic sore throat 05/01/2016   COVID 2020   Depression    Fibromyalgia    pt denies   Gastroparesis    GERD (gastroesophageal reflux disease)    History of hypotension    taken midodrine in the past- Spring of 2022   Hypermobile Ehlers-Danlos syndrome    Irregular heart rate    Keratosis pilaris    Migraine    PONV (postoperative nausea and vomiting)    "I can get really cold"   PTSD (post-traumatic stress disorder)    Seasonal allergic rhinitis 03/11/2014   Past Surgical History:  Procedure Laterality Date   CHOLECYSTECTOMY  05/09/2021   ESOPHAGOGASTRODUODENOSCOPY ENDOSCOPY     03/2020   HIP ARTHROSCOPY Right 04/25/2022   Procedure: RIGHT HIP ARTHROSCOPY WITH LABRAL RECONSTRUCTION, ILIOTIBIAL BAND ALLOGRAFT, PSOAS RELEASE;  Surgeon: Vanetta Mulders, MD;  Location: [facility];  Service: Orthopedics;  Laterality: Right;   LABRAL REPAIR Right 03/02/2021   Procedure: RIGHT HIP ARTHROSCOPY WITH LABRAL REPAIR AND PINCE DEBRIDEMENT;  Surgeon: Vanetta Mulders, MD;  Location: [facility];  Service: Orthopedics;  Laterality: Right;   SMART PILL PROCEDURE      12/2020   TYMPANOSTOMY TUBE PLACEMENT Bilateral    WISDOM TOOTH EXTRACTION     Patient Active Problem List   Diagnosis Date Noted   Cervical adenopathy 06/10/2021   Chronic pruritus 06/10/2021   Exercise-induced asthma 06/09/2021   Chronic venous insufficiency 06/03/2021   Orthostatic lightheadedness 06/03/2021   Purple toe syndrome of both feet ([place]) 06/03/2021   Autonomic dysfunction 06/03/2021   Tear of right acetabular labrum 04/13/2021   Migraine headache 03/01/2021   Headache 11/30/2020   Positive ANA (antinuclear antibody) 11/15/2020   Fibromyalgia 11/02/2020   Hair loss 11/02/2020   Muscle spasticity 10/22/2020   Hypermobile Ehlers-Danlos syndrome 10/22/2020   Paresthesia of bilateral legs 10/06/2020   Myalgia 10/06/2020   Chronic pelvic pain in female 10/06/2020   Chronic abdominal pain 10/06/2020   Irritable colon 08/02/2020   Pelvic floor dysfunction 08/02/2020   Irritant dermatitis 08/02/2020   Gastroesophageal reflux disease 06/28/2020   Severe recurrent major depression without psychotic features ([place]) 06/28/2020   Polyarthralgia 06/28/2020   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 06/28/2020   Tachycardia 06/28/2020   History of COVID-19 06/28/2020   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 deficit 06/28/2020   Recurrent major depressive disorder, in full remission ([place]) 06/28/2020   Atypical chest pain 06/22/2020   Dizziness 06/22/2020  Palpitations 06/14/2020   Anxiety 05/04/2020   Internal and external hemorrhoids without complication 22/06/5425   Nondiabetic gastroparesis 03/03/2020   ETD (Eustachian tube dysfunction), bilateral 10/21/2019   Acne vulgaris 03/11/2014   Mild intermittent asthma without complication 11/12/7626   Seasonal allergies 03/11/2014    REFERRING PROVIDER:  Vanetta Mulders, MD     REFERRING DIAG: [id number] (ICD-10-CM) - Tear of right acetabular labrum, subsequent encounter  post op 04/25/22 R hip  labral repair revision with IT band allograft   THERAPY DIAG:  Pain in right hip  Muscle weakness (generalized)  Difficulty in walking, not elsewhere classified  Rationale for Evaluation and Treatment: Rehabilitation  ONSET DATE: DOS 04/25/22 Days since surgery: 28   SUBJECTIVE:   SUBJECTIVE STATEMENT: Have been using one crutch at home the past couple of days. 2 incidences of hip popping that was uncomfortable. Felt similar to snapping hip.   PERTINENT HISTORY: EDS PAIN:  Are you having pain? Yes: NPRS scale: 4/10 Pain location: rt hip Pain description: sore Aggravating factors: movement Relieving factors: rest  PRECAUTIONS: Other: TDWB 2 weeks post op  WEIGHT BEARING RESTRICTIONS: TDWB 2 wk post op  FALLS:  Has patient fallen in last 6 months? 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  PLOF: Independent  PATIENT GOALS: yoga, take dance classes  OBJECTIVE:   PATIENT SURVEYS:  FOTO 27  POSTURE:flexed on crutches as expected  PALPATION: End ranges WFL  LOWER EXTREMITY ROM:  Passive ROM Right eval Right 05/23/22  Hip flexion 80 90  Hip extension 0   Hip abduction 30   Hip adduction    Hip internal rotation    Hip external rotation     (Blank rows = not tested)   GAIT: EVAL: TDWB with bil axillary crutches 05/23/22: able to ambulate without AD, locked in neutral for hip extension   TODAY'S TREATMENT:                                                                                                                               Treatment                            05/23/22:  Gait with single crutch, 2wheeled walker, no AD PROM to hip & gentle pressure to incision area Supine quad set with hold & controlled release SAQ Mini bridge-PT providing stability to leg to avoid rotation Sidelying hip abd   Treatment                            05/12/22:  PROM flexion, abd ER/IR LAD gentle paired with abd/add motion   Treatment                             05/05/22:  PROM flexion & abd, paired with distraction Roller to Rt quads  Hooklying bent knee fall outs with trA Bike no resist    PATIENT EDUCATION:  Education details: Geophysicist/field seismologist of condition, POC, HEP, exercise form/rationale Person educated: Patient and Parent Education method: Explanation, Demonstration, Tactile cues, Verbal cues, and Handouts Education comprehension: verbalized understanding, returned demonstration, verbal cues required, tactile cues required, and needs further education  HOME EXERCISE PROGRAM:  [id number]  ASSESSMENT:  CLINICAL IMPRESSION: Significant hypersensitivity to incision area to gentle touch and we discussed necessity to gradually introducing more pressure. Progressed WB to full without AD and advised that pain is a primary driver at this point, that if she is fatigued or hurting she needs to go back to AD.   OBJECTIVE IMPAIRMENTS: Abnormal gait, decreased activity tolerance, decreased balance, difficulty walking, decreased ROM, decreased strength, increased muscle spasms, impaired flexibility, improper body mechanics, and pain.   ACTIVITY LIMITATIONS: carrying, lifting, bending, sitting, standing, squatting, stairs, transfers, bed mobility, bathing, dressing, and locomotion level  PARTICIPATION LIMITATIONS: meal prep, cleaning, medication management, driving, shopping, community activity, and occupation  PERSONAL FACTORS: 3+ comorbidities: previous labral repair, EDS, autonomic dysfunction  are also affecting patient's functional outcome.     GOALS: Goals reviewed with patient? Yes  SHORT TERM GOALS: Target date: 05/26/22 30s STS without pain Baseline: Goal status: INITIAL  2.  Able to demo household ambulation without AD and with proper pattern Baseline:  Goal status: INITIAL    LONG TERM GOALS:   Able to demo step up on 6" step with level pelvis and proper form Baseline:  Goal status: 06/20/22  2.  Will tolerate at least 3 min on  elliptical, demonstrating good tolerance to repetitive weight bearing motion Baseline:  Goal status: INITIAL Week 8 06/20/22  3.  Demonstrate proper form in at least 10 continuous lunges without increased pain Baseline:  Goal status: INITIAL Week 12 07/18/22  4.  Demonstrate gentle, double and single foot plyometric motions with good proximal form Baseline:  Goal status: INITIAL Week 12 07/18/22  5.  Pt will begin gradual return to yoga with understanding of modifications and precautions Baseline:  Goal status: INITIAL 05/18/23  6.  Pt will participate in dance classes Baseline:  Goal status: INITIAL 05/18/23    PLAN:  PT FREQUENCY: 1-2x/week  PT DURATION: 12 weeks  PLANNED INTERVENTIONS: Therapeutic exercises, Therapeutic activity, Neuromuscular re-education, Balance training, Gait training, Patient/Family education, Self Care, Joint mobilization, Stair training, Aquatic Therapy, Dry Needling, Electrical stimulation, Spinal mobilization, Cryotherapy, Moist heat, Taping, Traction, Ultrasound, Ionotophoresis 4mg /ml Dexamethasone, and Manual therapy  PLAN FOR NEXT SESSION: continue per protocol   Zoltan Genest C. Derrall Hicks PT, DPT 05/23/22 8:17 PM

## 2022-05-24 ENCOUNTER — Ambulatory Visit (INDEPENDENT_AMBULATORY_CARE_PROVIDER_SITE_OTHER): Payer: BC Managed Care – PPO | Admitting: Orthopaedic Surgery

## 2022-05-24 DIAGNOSIS — S73191D Other sprain of right hip, subsequent encounter: Secondary | ICD-10-CM

## 2022-05-24 NOTE — Progress Notes (Signed)
Post Operative Evaluation    Procedure/Date of Surgery: Right hip arthroscopy with labral reconstruction 12/5  Interval History:   05/24/2022: Brianna [name] presents today for first formal postop visit following right hip arthroscopic labral reconstruction.  She states that she did feel a small pop with abduction although overall has been doing quite well.  She is increasing her weightbearing.  She is continuing to progress.  Denies any wound healing issues with the right side. PMH/PSH/Family History/Social History/Meds/Allergies:    Past Medical History:  Diagnosis Date   Acute recurrent pansinusitis 10/21/2019   ADHD (attention deficit hyperactivity disorder)    inattentive   Anemia    had iron infusions in Fall of 2023   Anxiety    Asthma    Bronchitis    Cat allergy due to both airborne and skin contact 05/01/2016   Chronic sore throat 05/01/2016   COVID 2020   Depression    Fibromyalgia    pt denies   Gastroparesis    GERD (gastroesophageal reflux disease)    History of hypotension    taken midodrine in the past- Spring of 2022   Hypermobile Ehlers-Danlos syndrome    Irregular heart rate    Keratosis pilaris    Migraine    PONV (postoperative nausea and vomiting)    "I can get really cold"   PTSD (post-traumatic stress disorder)    Seasonal allergic rhinitis 03/11/2014   Past Surgical History:  Procedure Laterality Date   CHOLECYSTECTOMY  05/09/2021   ESOPHAGOGASTRODUODENOSCOPY ENDOSCOPY     03/2020   HIP ARTHROSCOPY Right 04/25/2022   Procedure: RIGHT HIP ARTHROSCOPY WITH LABRAL RECONSTRUCTION, ILIOTIBIAL BAND ALLOGRAFT, PSOAS RELEASE;  Surgeon: Vanetta Mulders, MD;  Location: [facility];  Service: Orthopedics;  Laterality: Right;   LABRAL REPAIR Right 03/02/2021   Procedure: RIGHT HIP ARTHROSCOPY WITH LABRAL REPAIR AND PINCE DEBRIDEMENT;  Surgeon: Vanetta Mulders, MD;  Location: [facility];  Service: Orthopedics;  Laterality: Right;   SMART  PILL PROCEDURE     12/2020   TYMPANOSTOMY TUBE PLACEMENT Bilateral    WISDOM TOOTH EXTRACTION     Social History   Socioeconomic History   Marital status: Single    Spouse name: Not on file   Number of children: Not on file   Years of education: Not on file   Highest education level: Not on file  Occupational History   Not on file  Tobacco Use   Smoking status: Former    Types: E-cigarettes, Cigarettes    Quit date: 2021    Years since quitting: 3.0   Smokeless tobacco: Never  Vaping Use   Vaping Use: Former   Substances: Editor, commissioning  Substance and Sexual Activity   Alcohol use: Not Currently   Drug use: Yes    Frequency: 7.0 times per week    Types: Marijuana   Sexual activity: Yes    Birth control/protection: None  Other Topics Concern   Not on file  Social History Narrative   Right handed   Social Determinants of Health   Financial Resource Strain: Not on file  Food Insecurity: Not on file  Transportation Needs: Not on file  Physical Activity: Not on file  Stress: Not on file  Social Connections: Not on file   Family History  Problem Relation Age of Onset   Diabetes Mother  Polycystic ovary syndrome Mother    GER disease Father    Heart disease Paternal Uncle    Allergies  Allergen Reactions   Clindamycin Hives, Swelling and Rash   Tretinoin Dermatitis, Itching, Photosensitivity, Swelling and Other (See Comments)   Current Outpatient Medications  Medication Sig Dispense Refill   amoxicillin (AMOXIL) 875 MG tablet Take 1 tablet (875 mg total) by mouth 2 (two) times daily for 7 days. 14 tablet 0   aspirin EC 325 MG tablet TAKE 1 TABLET BY MOUTH EVERY DAY 90 tablet 1   benzonatate (TESSALON) 200 MG capsule Take 1 capsule (200 mg total) by mouth 3 (three) times daily as needed for up to 7 days. 40 capsule 0   clobetasol (TEMOVATE) 0.05 % external solution Apply 1 Application topically daily as needed (Irritation).     clonazePAM (KLONOPIN) 1  MG tablet Take 1 mg by mouth daily as needed for anxiety.     Dextromethorphan-buPROPion ER (AUVELITY) 45-105 MG TBCR Take 1 tablet by mouth 2 (two) times daily.     Drospirenone (SLYND) 4 MG TABS Take 1 tablet by mouth daily. 28 tablet 11   finasteride (PROPECIA) 1 MG tablet Take 1 mg by mouth daily.     Galcanezumab-gnlm (EMGALITY) 120 MG/ML SOAJ Inject 120 mg into the skin every 30 (thirty) days.     loratadine (CLARITIN) 10 MG tablet Take 10 mg by mouth daily.     naratriptan (AMERGE) 2.5 MG tablet Take 2.5 mg by mouth as needed for migraine.     Omega-3 Fatty Acids (FISH OIL PO) Take 1,400 Units by mouth daily. Mini     ondansetron (ZOFRAN ODT) 4 MG disintegrating tablet Take 1 tablet (4 mg total) by mouth every 8 (eight) hours as needed for nausea or vomiting. 20 tablet 5   Pediatric Multiple Vitamins (PEDIATRIC MULTIVITAMIN) chewable tablet Chew 1 tablet by mouth daily.     promethazine (PHENERGAN) 25 MG tablet Take 1 tablet (25 mg total) by mouth every 6 (six) hours as needed for nausea or vomiting. (Patient taking differently: Take 12.5 mg by mouth every 6 (six) hours as needed for nausea or vomiting.) 30 tablet 0   tazarotene (TAZORAC) 0.05 % cream Apply 1 Application topically every other day.     tranexamic acid (LYSTEDA) 650 MG TABS tablet Take 2 tablets (1,300 mg total) by mouth 3 (three) times daily. Take during menses for a maximum of five days 30 tablet 2   triamcinolone cream (KENALOG) 0.1 % Apply 1 Application topically daily as needed (Eczma).     No current facility-administered medications for this visit.   No results found.  Review of Systems:   A ROS was performed including pertinent positives and negatives as documented in the HPI.   Musculoskeletal Exam:    Right hip incisions are well-appearing without erythema or drainage.  Range of motion is 20 degrees internal rotation and 35 degrees external rotation without significant pain.  Strength testing deferred today.   Neurosensory exam is intact  Imaging:    None  I personally reviewed and interpreted the radiographs.   Assessment:   27 year old female status post right hip arthroscopic labral reconstruction overall doing well.  At this time she will discontinue her brace and continue to advance her weightbearing as tolerated.  She also continue to work through the labral repair protocol.  I will plan to see her back in 4 weeks for reassessment Plan :    -Return to clinic 4 weeks for  reassessment    I personally saw and evaluated the patient, and participated in the management and treatment plan.  Vanetta Mulders, MD Attending Physician, Orthopedic Surgery  This document was dictated using Dragon voice recognition software. A reasonable attempt at proof reading has been made to minimize errors.

## 2022-05-26 ENCOUNTER — Encounter (HOSPITAL_BASED_OUTPATIENT_CLINIC_OR_DEPARTMENT_OTHER): Payer: Self-pay | Admitting: Physical Therapy

## 2022-05-26 ENCOUNTER — Encounter (HOSPITAL_BASED_OUTPATIENT_CLINIC_OR_DEPARTMENT_OTHER): Payer: BC Managed Care – PPO | Admitting: Physical Therapy

## 2022-05-26 ENCOUNTER — Ambulatory Visit (HOSPITAL_BASED_OUTPATIENT_CLINIC_OR_DEPARTMENT_OTHER): Payer: BC Managed Care – PPO | Admitting: Physical Therapy

## 2022-05-26 DIAGNOSIS — M25551 Pain in right hip: Secondary | ICD-10-CM

## 2022-05-26 DIAGNOSIS — M6281 Muscle weakness (generalized): Secondary | ICD-10-CM

## 2022-05-26 NOTE — Telephone Encounter (Signed)
Phone number [phone], she said it is under her neurologist Barbara Nye the note that she has in the system should have a detailed note on the tried/failed medications and she said if you had any further questions you can reach out to her.    

## 2022-05-26 NOTE — Telephone Encounter (Signed)
This case still remains in denial status, I spoke w/ Ms Brianna [name] and she advised that she will contact her insurance and would like the appt on the 9th to be a follow up so further options can be discussed.

## 2022-05-26 NOTE — Therapy (Signed)
OUTPATIENT PHYSICAL THERAPY LOWER EXTREMITY TREATMENT   Patient Name: Brianna [name] MRN: [id number] DOB:1996-02-26, 27 y.o., female Today's Date: 05/26/2022  END OF SESSION:  PT End of Session - 05/26/22 0940     Visit Number 5    Number of Visits 25    Date for PT Re-Evaluation 07/21/22    Authorization Type BC[employer]    PT Start Time [id number]    PT Stop Time 1011    PT Time Calculation (min) 34 min    Activity Tolerance Patient tolerated treatment well    Behavior During Therapy [facility] for tasks assessed/performed                Past Medical History:  Diagnosis Date   Acute recurrent pansinusitis 10/21/2019   ADHD (attention deficit hyperactivity disorder)    inattentive   Anemia    had iron infusions in Fall of 2023   Anxiety    Asthma    Bronchitis    Cat allergy due to both airborne and skin contact 05/01/2016   Chronic sore throat 05/01/2016   COVID 2020   Depression    Fibromyalgia    pt denies   Gastroparesis    GERD (gastroesophageal reflux disease)    History of hypotension    taken midodrine in the past- Spring of 2022   Hypermobile Ehlers-Danlos syndrome    Irregular heart rate    Keratosis pilaris    Migraine    PONV (postoperative nausea and vomiting)    "I can get really cold"   PTSD (post-traumatic stress disorder)    Seasonal allergic rhinitis 03/11/2014   Past Surgical History:  Procedure Laterality Date   CHOLECYSTECTOMY  05/09/2021   ESOPHAGOGASTRODUODENOSCOPY ENDOSCOPY     03/2020   HIP ARTHROSCOPY Right 04/25/2022   Procedure: RIGHT HIP ARTHROSCOPY WITH LABRAL RECONSTRUCTION, ILIOTIBIAL BAND ALLOGRAFT, PSOAS RELEASE;  Surgeon: [name], MD;  Location: [facility];  Service: Orthopedics;  Laterality: Right;   LABRAL REPAIR Right 03/02/2021   Procedure: RIGHT HIP ARTHROSCOPY WITH LABRAL REPAIR AND PINCE DEBRIDEMENT;  Surgeon: [name], MD;  Location: [facility];  Service: Orthopedics;  Laterality: Right;   SMART PILL PROCEDURE      12/2020   TYMPANOSTOMY TUBE PLACEMENT Bilateral    WISDOM TOOTH EXTRACTION     Patient Active Problem List   Diagnosis Date Noted   Cervical adenopathy 06/10/2021   Chronic pruritus 06/10/2021   Exercise-induced asthma 06/09/2021   Chronic venous insufficiency 06/03/2021   Orthostatic lightheadedness 06/03/2021   Purple toe syndrome of both feet (HCC) 06/03/2021   Autonomic dysfunction 06/03/2021   Tear of right acetabular labrum 04/13/2021   Migraine headache 03/01/2021   Headache 11/30/2020   Positive ANA (antinuclear antibody) 11/15/2020   Fibromyalgia 11/02/2020   Hair loss 11/02/2020   Muscle spasticity 10/22/2020   Hypermobile Ehlers-Danlos syndrome 10/22/2020   Paresthesia of bilateral legs 10/06/2020   Myalgia 10/06/2020   Chronic pelvic pain in female 10/06/2020   Chronic abdominal pain 10/06/2020   Irritable colon 08/02/2020   Pelvic floor dysfunction 08/02/2020   Irritant dermatitis 08/02/2020   Gastroesophageal reflux disease 06/28/2020   Severe recurrent major depression without psychotic features (HCC) 06/28/2020   Polyarthralgia 06/28/2020   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 06/28/2020   Tachycardia 06/28/2020   History of COVID-19 06/28/2020   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 deficit 06/28/2020   Recurrent major depressive disorder, in full remission (HCC) 06/28/2020   Atypical chest pain 06/22/2020   Dizziness  06/22/2020   Palpitations 06/14/2020   Anxiety 05/04/2020   Internal and external hemorrhoids without complication 92/33/0076   Nondiabetic gastroparesis 03/03/2020   ETD (Eustachian tube dysfunction), bilateral 10/21/2019   Acne vulgaris 03/11/2014   Mild intermittent asthma without complication 22/63/3354   Seasonal allergies 03/11/2014    REFERRING PROVIDER:  Vanetta Mulders, MD     REFERRING DIAG: [phone] (ICD-10-CM) - Tear of right acetabular labrum, subsequent encounter  post op 04/25/22 R hip  labral repair revision with IT band allograft   THERAPY DIAG:  Pain in right hip  Muscle weakness (generalized)  Rationale for Evaluation and Treatment: Rehabilitation  ONSET DATE: DOS 04/25/22 Days since surgery: 31   SUBJECTIVE:   SUBJECTIVE STATEMENT: Pain radiating down the leg, woke up with it wed AM, possibly as result of intercourse. Walking without AD about 80% of the time at home.   PERTINENT HISTORY: EDS PAIN:  Are you having pain? Yes: NPRS scale: 4/10 Pain location: rt hip Pain description: sore Aggravating factors: movement Relieving factors: rest  PRECAUTIONS: Other: TDWB 2 weeks post op  WEIGHT BEARING RESTRICTIONS: TDWB 2 wk post op  FALLS:  Has patient fallen in last 6 months? 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  PLOF: Independent  PATIENT GOALS: yoga, take dance classes  OBJECTIVE:   PATIENT SURVEYS:  FOTO 27  POSTURE:flexed on crutches as expected  PALPATION: End ranges WFL  LOWER EXTREMITY ROM:  Passive ROM Right eval Right 05/23/22  Hip flexion 80 90  Hip extension 0   Hip abduction 30   Hip adduction    Hip internal rotation    Hip external rotation     (Blank rows = not tested)   GAIT: EVAL: TDWB with bil axillary crutches 05/23/22: able to ambulate without AD, locked in neutral for hip extension   TODAY'S TREATMENT:                                                                                                                                Treatment                            05/26/22:  Bike 6 min L2 MANUAL: passive hip flexion- paired with end range + slight add and post hip mobs grade 3; STM Rt hip glut med/min & TFL; prone IASTM hamstrings and gastroc Gait without crutches today and cues for heel strike for post chain stretch Seated HSS   Treatment                            05/23/22:  Gait with single crutch, 2wheeled walker, no AD PROM to hip & gentle pressure to incision area Supine quad set with hold &  controlled release SAQ Mini bridge-PT providing stability to leg to avoid rotation Sidelying hip abd   Treatment  05/12/22:  PROM flexion, abd ER/IR LAD gentle paired with abd/add motion    PATIENT EDUCATION:  Education details: Anatomy of condition, POC, HEP, exercise form/rationale Person educated: Patient and Parent Education method: Explanation, Demonstration, Tactile cues, Verbal cues, and Handouts Education comprehension: verbalized understanding, returned demonstration, verbal cues required, tactile cues required, and needs further education  HOME EXERCISE PROGRAM:  [id number]  ASSESSMENT:  CLINICAL IMPRESSION: Tightness reduced along RLE with manual therapy today. Demo proper gait and good control of LE- used tactile cues for sit<>stand at midline.   OBJECTIVE IMPAIRMENTS: Abnormal gait, decreased activity tolerance, decreased balance, difficulty walking, decreased ROM, decreased strength, increased muscle spasms, impaired flexibility, improper body mechanics, and pain.   ACTIVITY LIMITATIONS: carrying, lifting, bending, sitting, standing, squatting, stairs, transfers, bed mobility, bathing, dressing, and locomotion level  PARTICIPATION LIMITATIONS: meal prep, cleaning, medication management, driving, shopping, community activity, and occupation  PERSONAL FACTORS: 3+ comorbidities: previous labral repair, EDS, autonomic dysfunction  are also affecting patient's functional outcome.     GOALS: Goals reviewed with patient? Yes  SHORT TERM GOALS: Target date: 05/26/22 30s STS without pain Baseline: x12 Goal status: achieved  2.  Able to demo household ambulation without AD and with proper pattern Baseline:  Goal status: achieved    LONG TERM GOALS:   Able to demo step up on 6" step with level pelvis and proper form Baseline:  Goal status: 06/20/22  2.  Will tolerate at least 3 min on elliptical, demonstrating good tolerance to  repetitive weight bearing motion Baseline:  Goal status: INITIAL Week 8 06/20/22  3.  Demonstrate proper form in at least 10 continuous lunges without increased pain Baseline:  Goal status: INITIAL Week 12 07/18/22  4.  Demonstrate gentle, double and single foot plyometric motions with good proximal form Baseline:  Goal status: INITIAL Week 12 07/18/22  5.  Pt will begin gradual return to yoga with understanding of modifications and precautions Baseline:  Goal status: INITIAL 05/18/23  6.  Pt will participate in dance classes Baseline:  Goal status: INITIAL 05/18/23    PLAN:  PT FREQUENCY: 1-2x/week  PT DURATION: 12 weeks  PLANNED INTERVENTIONS: Therapeutic exercises, Therapeutic activity, Neuromuscular re-education, Balance training, Gait training, Patient/Family education, Self Care, Joint mobilization, Stair training, Aquatic Therapy, Dry Needling, Electrical stimulation, Spinal mobilization, Cryotherapy, Moist heat, Taping, Traction, Ultrasound, Ionotophoresis 4mg /ml Dexamethasone, and Manual therapy  PLAN FOR NEXT SESSION: continue per protocol   Yarisa Lynam C. Remie Mathison PT, DPT 05/26/22 11:04 AM

## 2022-05-27 ENCOUNTER — Ambulatory Visit: Payer: BC Managed Care – PPO

## 2022-05-29 ENCOUNTER — Encounter: Payer: Self-pay | Admitting: Obstetrics and Gynecology

## 2022-05-30 ENCOUNTER — Encounter: Payer: BC Managed Care – PPO | Admitting: Physical Medicine and Rehabilitation

## 2022-05-30 NOTE — Telephone Encounter (Signed)
Phone number [phone], she said it is under her neurologist Mila Merry the note that she has in the system should have a detailed note on the tried/failed medications and she said if you had any further questions you can reach out to her.

## 2022-05-31 ENCOUNTER — Encounter (HOSPITAL_BASED_OUTPATIENT_CLINIC_OR_DEPARTMENT_OTHER): Payer: Self-pay | Admitting: Physical Therapy

## 2022-05-31 ENCOUNTER — Ambulatory Visit (HOSPITAL_BASED_OUTPATIENT_CLINIC_OR_DEPARTMENT_OTHER): Payer: BC Managed Care – PPO | Admitting: Physical Therapy

## 2022-05-31 DIAGNOSIS — M25551 Pain in right hip: Secondary | ICD-10-CM | POA: Diagnosis not present

## 2022-05-31 DIAGNOSIS — R262 Difficulty in walking, not elsewhere classified: Secondary | ICD-10-CM

## 2022-05-31 DIAGNOSIS — M6281 Muscle weakness (generalized): Secondary | ICD-10-CM

## 2022-05-31 NOTE — Therapy (Signed)
OUTPATIENT PHYSICAL THERAPY LOWER EXTREMITY TREATMENT   Patient Name: Brianna [name] MRN: [id number] DOB:July 17, 1995, 27 y.o., female Today's Date: 05/31/2022  END OF SESSION:  PT End of Session - 05/31/22 1420     Visit Number 6    Number of Visits 25    Date for PT Re-Evaluation 07/21/22    Authorization Type BCBS    PT Start Time 1100    PT Stop Time 1143    PT Time Calculation (min) 43 min    Activity Tolerance Patient tolerated treatment well    Behavior During Therapy [facility] for tasks assessed/performed                 Past Medical History:  Diagnosis Date   Acute recurrent pansinusitis 10/21/2019   ADHD (attention deficit hyperactivity disorder)    inattentive   Anemia    had iron infusions in Fall of 2023   Anxiety    Asthma    Bronchitis    Cat allergy due to both airborne and skin contact 05/01/2016   Chronic sore throat 05/01/2016   COVID 2020   Depression    Fibromyalgia    pt denies   Gastroparesis    GERD (gastroesophageal reflux disease)    History of hypotension    taken midodrine in the past- Spring of 2022   Hypermobile Ehlers-Danlos syndrome    Irregular heart rate    Keratosis pilaris    Migraine    PONV (postoperative nausea and vomiting)    "I can get really cold"   PTSD (post-traumatic stress disorder)    Seasonal allergic rhinitis 03/11/2014   Past Surgical History:  Procedure Laterality Date   CHOLECYSTECTOMY  05/09/2021   ESOPHAGOGASTRODUODENOSCOPY ENDOSCOPY     03/2020   HIP ARTHROSCOPY Right 04/25/2022   Procedure: RIGHT HIP ARTHROSCOPY WITH LABRAL RECONSTRUCTION, ILIOTIBIAL BAND ALLOGRAFT, PSOAS RELEASE;  Surgeon: Vanetta Mulders, MD;  Location: [place];  Service: Orthopedics;  Laterality: Right;   LABRAL REPAIR Right 03/02/2021   Procedure: RIGHT HIP ARTHROSCOPY WITH LABRAL REPAIR AND PINCE DEBRIDEMENT;  Surgeon: Vanetta Mulders, MD;  Location: [place];  Service: Orthopedics;  Laterality: Right;   SMART PILL PROCEDURE      12/2020   TYMPANOSTOMY TUBE PLACEMENT Bilateral    WISDOM TOOTH EXTRACTION     Patient Active Problem List   Diagnosis Date Noted   Cervical adenopathy 06/10/2021   Chronic pruritus 06/10/2021   Exercise-induced asthma 06/09/2021   Chronic venous insufficiency 06/03/2021   Orthostatic lightheadedness 06/03/2021   Purple toe syndrome of both feet ([place]) 06/03/2021   Autonomic dysfunction 06/03/2021   Tear of right acetabular labrum 04/13/2021   Migraine headache 03/01/2021   Headache 11/30/2020   Positive ANA (antinuclear antibody) 11/15/2020   Fibromyalgia 11/02/2020   Hair loss 11/02/2020   Muscle spasticity 10/22/2020   Hypermobile Ehlers-Danlos syndrome 10/22/2020   Paresthesia of bilateral legs 10/06/2020   Myalgia 10/06/2020   Chronic pelvic pain in female 10/06/2020   Chronic abdominal pain 10/06/2020   Irritable colon 08/02/2020   Pelvic floor dysfunction 08/02/2020   Irritant dermatitis 08/02/2020   Gastroesophageal reflux disease 06/28/2020   Severe recurrent major depression without psychotic features ([place]) 06/28/2020   Polyarthralgia 06/28/2020   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 06/28/2020   Tachycardia 06/28/2020   History of COVID-19 06/28/2020   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 deficit 06/28/2020   Recurrent major depressive disorder, in full remission ([place]) 06/28/2020   Atypical chest pain 06/22/2020  Dizziness 06/22/2020   Palpitations 06/14/2020   Anxiety 05/04/2020   Internal and external hemorrhoids without complication 77/82/4235   Nondiabetic gastroparesis 03/03/2020   ETD (Eustachian tube dysfunction), bilateral 10/21/2019   Acne vulgaris 03/11/2014   Mild intermittent asthma without complication 36/14/4315   Seasonal allergies 03/11/2014    REFERRING PROVIDER:  Vanetta Mulders, MD     REFERRING DIAG: [phone] (ICD-10-CM) - Tear of right acetabular labrum, subsequent encounter  post op 04/25/22 R  hip labral repair revision with IT band allograft   THERAPY DIAG:  Pain in right hip  Muscle weakness (generalized)  Difficulty in walking, not elsewhere classified  Rationale for Evaluation and Treatment: Rehabilitation  ONSET DATE: DOS 04/25/22 Days since surgery: 36   SUBJECTIVE:   SUBJECTIVE STATEMENT: Pt reports intermittent "pinching' pain, especially since weaning off crutches. Reports minimal use of bilateral crutches when out in public.   PERTINENT HISTORY: EDS PAIN:  Are you having pain? Yes: NPRS scale: 5/10 Pain location: rt hip Pain description: sore Aggravating factors: movement Relieving factors: rest  PRECAUTIONS: Other: TDWB 2 weeks post op  WEIGHT BEARING RESTRICTIONS: TDWB 2 wk post op  FALLS:  Has patient fallen in last 6 months? 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  PLOF: Independent  PATIENT GOALS: yoga, take dance classes  OBJECTIVE:   PATIENT SURVEYS:  FOTO 27  POSTURE:flexed on crutches as expected  PALPATION: End ranges WFL  LOWER EXTREMITY ROM:  Passive ROM Right eval Right 05/23/22  Hip flexion 80 90  Hip extension 0   Hip abduction 30   Hip adduction    Hip internal rotation    Hip external rotation     (Blank rows = not tested)   GAIT: EVAL: TDWB with bil axillary crutches 05/23/22: able to ambulate without AD, locked in neutral for hip extension   TODAY'S TREATMENT:                     Treatment                            05/31/22:  Bike 6 min L2 MANUAL: passive hip flexion- paired with end range + slight add and post hip mobs grade 3; Grade 1 hip distraction with gentle oscillations, STM Rt hip glut med/min & TFL; prone IASTM hamstrings and gastroc Gait without crutches today and cues for heel strike for post chain stretch Seated HSS     Bent knee fallout  x20 Bridges x20     Prone hamstring curl x20  Prone glute set x20 Prone IR/ER x20ea     LAQ x30                                                                                              Treatment                            05/26/22:  Bike 6 min L2 MANUAL: passive hip flexion- paired with end range + slight add and post hip mobs grade  3; STM Rt hip glut med/min & TFL; prone IASTM hamstrings and gastroc Gait without crutches today and cues for heel strike for post chain stretch Seated HSS   Treatment                            05/23/22:  Gait with single crutch, 2wheeled walker, no AD PROM to hip & gentle pressure to incision area Supine quad set with hold & controlled release SAQ Mini bridge-PT providing stability to leg to avoid rotation Sidelying hip abd   Treatment                            05/12/22:  PROM flexion, abd ER/IR LAD gentle paired with abd/add motion    PATIENT EDUCATION:  Education details: Anatomy of condition, POC, HEP, exercise form/rationale Person educated: Patient and Parent Education method: Explanation, Demonstration, Tactile cues, Verbal cues, and Handouts Education comprehension: verbalized understanding, returned demonstration, verbal cues required, tactile cues required, and needs further education  HOME EXERCISE PROGRAM:  [id number]  ASSESSMENT:  CLINICAL IMPRESSION: Therapy added pornoe ER/IR to her home program to improved active motion. We continue to work on manual therapy to improve all motions. We performed trigger point release to her anterior hip 2nd to pinching in the hip over the past few days. We also added in lowgrade mobilizations which helped her motion. We will continue to progress as tolerated.   OBJECTIVE IMPAIRMENTS: Abnormal gait, decreased activity tolerance, decreased balance, difficulty walking, decreased ROM, decreased strength, increased muscle spasms, impaired flexibility, improper body mechanics, and pain.   ACTIVITY LIMITATIONS: carrying, lifting, bending, sitting, standing, squatting, stairs, transfers, bed mobility, bathing, dressing, and locomotion level  PARTICIPATION  LIMITATIONS: meal prep, cleaning, medication management, driving, shopping, community activity, and occupation  PERSONAL FACTORS: 3+ comorbidities: previous labral repair, EDS, autonomic dysfunction  are also affecting patient's functional outcome.     GOALS: Goals reviewed with patient? Yes  SHORT TERM GOALS: Target date: 05/26/22 30s STS without pain Baseline: x12 Goal status: achieved  2.  Able to demo household ambulation without AD and with proper pattern Baseline:  Goal status: achieved    LONG TERM GOALS:   Able to demo step up on 6" step with level pelvis and proper form Baseline:  Goal status: 06/20/22  2.  Will tolerate at least 3 min on elliptical, demonstrating good tolerance to repetitive weight bearing motion Baseline:  Goal status: INITIAL Week 8 06/20/22  3.  Demonstrate proper form in at least 10 continuous lunges without increased pain Baseline:  Goal status: INITIAL Week 12 07/18/22  4.  Demonstrate gentle, double and single foot plyometric motions with good proximal form Baseline:  Goal status: INITIAL Week 12 07/18/22  5.  Pt will begin gradual return to yoga with understanding of modifications and precautions Baseline:  Goal status: INITIAL 05/18/23  6.  Pt will participate in dance classes Baseline:  Goal status: INITIAL 05/18/23    PLAN:  PT FREQUENCY: 1-2x/week  PT DURATION: 12 weeks  PLANNED INTERVENTIONS: Therapeutic exercises, Therapeutic activity, Neuromuscular re-education, Balance training, Gait training, Patient/Family education, Self Care, Joint mobilization, Stair training, Aquatic Therapy, Dry Needling, Electrical stimulation, Spinal mobilization, Cryotherapy, Moist heat, Taping, Traction, Ultrasound, Ionotophoresis 4mg /ml Dexamethasone, and Manual therapy  PLAN FOR NEXT SESSION: continue per protocol   , DPT

## 2022-06-02 ENCOUNTER — Ambulatory Visit (HOSPITAL_BASED_OUTPATIENT_CLINIC_OR_DEPARTMENT_OTHER): Payer: BC Managed Care – PPO | Admitting: Physical Therapy

## 2022-06-02 ENCOUNTER — Encounter (HOSPITAL_BASED_OUTPATIENT_CLINIC_OR_DEPARTMENT_OTHER): Payer: Self-pay | Admitting: Physical Therapy

## 2022-06-02 DIAGNOSIS — M25551 Pain in right hip: Secondary | ICD-10-CM | POA: Diagnosis not present

## 2022-06-02 DIAGNOSIS — R262 Difficulty in walking, not elsewhere classified: Secondary | ICD-10-CM

## 2022-06-02 DIAGNOSIS — M6281 Muscle weakness (generalized): Secondary | ICD-10-CM

## 2022-06-02 NOTE — Therapy (Signed)
OUTPATIENT PHYSICAL THERAPY LOWER EXTREMITY TREATMENT   Patient Name: Brianna [name] MRN: [id number] DOB:09/29/1995, 27 y.o., female Today's Date: 06/02/2022  END OF SESSION:  PT End of Session - 06/02/22 1412     Visit Number 7    Number of Visits 25    Date for PT Re-Evaluation 07/21/22    Authorization Type BCBS    PT Start Time 1315    PT Stop Time 1355    PT Time Calculation (min) 40 min    Activity Tolerance Patient tolerated treatment well    Behavior During Therapy [facility] for tasks assessed/performed                  Past Medical History:  Diagnosis Date   Acute recurrent pansinusitis 10/21/2019   ADHD (attention deficit hyperactivity disorder)    inattentive   Anemia    had iron infusions in Fall of 2023   Anxiety    Asthma    Bronchitis    Cat allergy due to both airborne and skin contact 05/01/2016   Chronic sore throat 05/01/2016   COVID 2020   Depression    Fibromyalgia    pt denies   Gastroparesis    GERD (gastroesophageal reflux disease)    History of hypotension    taken midodrine in the past- Spring of 2022   Hypermobile Ehlers-Danlos syndrome    Irregular heart rate    Keratosis pilaris    Migraine    PONV (postoperative nausea and vomiting)    "I can get really cold"   PTSD (post-traumatic stress disorder)    Seasonal allergic rhinitis 03/11/2014   Past Surgical History:  Procedure Laterality Date   CHOLECYSTECTOMY  05/09/2021   ESOPHAGOGASTRODUODENOSCOPY ENDOSCOPY     03/2020   HIP ARTHROSCOPY Right 04/25/2022   Procedure: RIGHT HIP ARTHROSCOPY WITH LABRAL RECONSTRUCTION, ILIOTIBIAL BAND ALLOGRAFT, PSOAS RELEASE;  Surgeon: Vanetta Mulders, MD;  Location: [place];  Service: Orthopedics;  Laterality: Right;   LABRAL REPAIR Right 03/02/2021   Procedure: RIGHT HIP ARTHROSCOPY WITH LABRAL REPAIR AND PINCE DEBRIDEMENT;  Surgeon: Vanetta Mulders, MD;  Location: [place];  Service: Orthopedics;  Laterality: Right;   SMART PILL PROCEDURE      12/2020   TYMPANOSTOMY TUBE PLACEMENT Bilateral    WISDOM TOOTH EXTRACTION     Patient Active Problem List   Diagnosis Date Noted   Cervical adenopathy 06/10/2021   Chronic pruritus 06/10/2021   Exercise-induced asthma 06/09/2021   Chronic venous insufficiency 06/03/2021   Orthostatic lightheadedness 06/03/2021   Purple toe syndrome of both feet ([place]) 06/03/2021   Autonomic dysfunction 06/03/2021   Tear of right acetabular labrum 04/13/2021   Migraine headache 03/01/2021   Headache 11/30/2020   Positive ANA (antinuclear antibody) 11/15/2020   Fibromyalgia 11/02/2020   Hair loss 11/02/2020   Muscle spasticity 10/22/2020   Hypermobile Ehlers-Danlos syndrome 10/22/2020   Paresthesia of bilateral legs 10/06/2020   Myalgia 10/06/2020   Chronic pelvic pain in female 10/06/2020   Chronic abdominal pain 10/06/2020   Irritable colon 08/02/2020   Pelvic floor dysfunction 08/02/2020   Irritant dermatitis 08/02/2020   Gastroesophageal reflux disease 06/28/2020   Severe recurrent major depression without psychotic features ([place]) 06/28/2020   Polyarthralgia 06/28/2020   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 06/28/2020   Tachycardia 06/28/2020   History of COVID-19 06/28/2020   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 deficit 06/28/2020   Recurrent major depressive disorder, in full remission ([place]) 06/28/2020   Atypical chest pain 06/22/2020  Dizziness 06/22/2020   Palpitations 06/14/2020   Anxiety 05/04/2020   Internal and external hemorrhoids without complication 04/29/2020   Nondiabetic gastroparesis 03/03/2020   ETD (Eustachian tube dysfunction), bilateral 10/21/2019   Acne vulgaris 03/11/2014   Mild intermittent asthma without complication 03/11/2014   Seasonal allergies 03/11/2014    REFERRING PROVIDER:  [name], MD     REFERRING DIAG: [phone] (ICD-10-CM) - Tear of right acetabular labrum, subsequent encounter  post op 04/25/22 R  hip labral repair revision with IT band allograft   THERAPY DIAG:  Pain in right hip  Muscle weakness (generalized)  Difficulty in walking, not elsewhere classified  Rationale for Evaluation and Treatment: Rehabilitation  ONSET DATE: DOS 04/25/22 Days since surgery: 38   SUBJECTIVE:   SUBJECTIVE STATEMENT: A little pinching when walking but doing well overall.   PERTINENT HISTORY: EDS PAIN:  Are you having pain? Yes: NPRS scale: 5/10 Pain location: rt hip Pain description: sore Aggravating factors: movement Relieving factors: rest  PRECAUTIONS: Other: TDWB 2 weeks post op  WEIGHT BEARING RESTRICTIONS: TDWB 2 wk post op  FALLS:  Has patient fallen in last 6 months? 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  PLOF: Independent  PATIENT GOALS: yoga, take dance classes  OBJECTIVE:   PATIENT SURVEYS:  FOTO 27  POSTURE:flexed on crutches as expected  PALPATION: End ranges WFL  LOWER EXTREMITY ROM:  Passive ROM Right eval Right 05/23/22  Hip flexion 80 90  Hip extension 0   Hip abduction 30   Hip adduction    Hip internal rotation    Hip external rotation     (Blank rows = not tested)   GAIT: EVAL: TDWB with bil axillary crutches 05/23/22: able to ambulate without AD, locked in neutral for hip extension   TODAY'S TREATMENT:          Treatment                            06/02/22:  Scar mobilization & IASTM ITB and VL SL clams & hip abd LAQ + round back for core engagement Prone HS curl, +glut set Prone heel squeeze at 90 deg knee flexion Seated HSS Gait- glut med activation to reduce trendelenburg  Bike 5 min              Treatment                            05/31/22:  Bike 6 min L2 MANUAL: passive hip flexion- paired with end range + slight add and post hip mobs grade 3; Grade 1 hip distraction with gentle oscillations, STM Rt hip glut med/min & TFL; prone IASTM hamstrings and gastroc Gait without crutches today and cues for heel strike for post  chain stretch Seated HSS     Bent knee fallout  x20 Bridges x20     Prone hamstring curl x20  Prone glute set x20 Prone IR/ER x20ea     LAQ x30  Treatment                            05/26/22:  Bike 6 min L2 MANUAL: passive hip flexion- paired with end range + slight add and post hip mobs grade 3; STM Rt hip glut med/min & TFL; prone IASTM hamstrings and gastroc Gait without crutches today and cues for heel strike for post chain stretch Seated HSS      PATIENT EDUCATION:  Education details: Anatomy of condition, POC, HEP, exercise form/rationale Person educated: Patient and Parent Education method: Explanation, Demonstration, Tactile cues, Verbal cues, and Handouts Education comprehension: verbalized understanding, returned demonstration, verbal cues required, tactile cues required, and needs further education  HOME EXERCISE PROGRAM:  [id number]  ASSESSMENT:  CLINICAL IMPRESSION: Trendelenburg notable in Rt stance phase of gait contributing to pinching- worked on the awareness of level pelvis in gait today and will continue to improve with strength. Considering applying to work at [employer] so will incorporate into [place].   OBJECTIVE IMPAIRMENTS: Abnormal gait, decreased activity tolerance, decreased balance, difficulty walking, decreased ROM, decreased strength, increased muscle spasms, impaired flexibility, improper body mechanics, and pain.   ACTIVITY LIMITATIONS: carrying, lifting, bending, sitting, standing, squatting, stairs, transfers, bed mobility, bathing, dressing, and locomotion level  PARTICIPATION LIMITATIONS: meal prep, cleaning, medication management, driving, shopping, community activity, and occupation  PERSONAL FACTORS: 3+ comorbidities: previous labral repair, EDS, autonomic dysfunction  are also affecting patient's functional outcome.     GOALS: Goals reviewed with  patient? Yes  SHORT TERM GOALS: Target date: 05/26/22 30s STS without pain Baseline: x12 Goal status: achieved  2.  Able to demo household ambulation without AD and with proper pattern Baseline:  Goal status: achieved    LONG TERM GOALS:   Able to demo step up on 6" step with level pelvis and proper form Baseline:  Goal status: 06/20/22  2.  Will tolerate at least 3 min on elliptical, demonstrating good tolerance to repetitive weight bearing motion Baseline:  Goal status: INITIAL Week 8 06/20/22  3.  Demonstrate proper form in at least 10 continuous lunges without increased pain Baseline:  Goal status: INITIAL Week 12 07/18/22  4.  Demonstrate gentle, double and single foot plyometric motions with good proximal form Baseline:  Goal status: INITIAL Week 12 07/18/22  5.  Pt will begin gradual return to yoga with understanding of modifications and precautions Baseline:  Goal status: INITIAL 05/18/23  6.  Pt will participate in dance classes Baseline:  Goal status: INITIAL 05/18/23    PLAN:  PT FREQUENCY: 1-2x/week  PT DURATION: 12 weeks  PLANNED INTERVENTIONS: Therapeutic exercises, Therapeutic activity, Neuromuscular re-education, Balance training, Gait training, Patient/Family education, Self Care, Joint mobilization, Stair training, Aquatic Therapy, Dry Needling, Electrical stimulation, Spinal mobilization, Cryotherapy, Moist heat, Taping, Traction, Ultrasound, Ionotophoresis 4mg /ml Dexamethasone, and Manual therapy  PLAN FOR NEXT SESSION: continue per protocol   Tish Begin C. Indiah Heyden PT, DPT 06/02/22 2:16 PM

## 2022-06-06 ENCOUNTER — Encounter
Payer: BC Managed Care – PPO | Attending: Physical Medicine and Rehabilitation | Admitting: Physical Medicine and Rehabilitation

## 2022-06-06 DIAGNOSIS — G43E11 Chronic migraine with aura, intractable, with status migrainosus: Secondary | ICD-10-CM

## 2022-06-06 LAB — LIPID PANEL
Chol/HDL Ratio: 2.3 ratio (ref 0.0–4.4)
Cholesterol, Total: 157 mg/dL (ref 100–199)
HDL: 68 mg/dL (ref >39)
LDL Chol Calc (NIH): 75 mg/dL (ref 0–99)
Triglycerides: 70 mg/dL (ref 0–149)
VLDL Cholesterol Cal: 14 mg/dL (ref 5–40)

## 2022-06-06 LAB — ESTRADIOL: Estradiol: 28 pg/mL

## 2022-06-06 LAB — TSH: TSH: 0.894 u[IU]/mL (ref 0.450–4.500)

## 2022-06-06 LAB — FOLLICLE STIMULATING HORMONE: FSH: 7.8 m[IU]/mL

## 2022-06-06 LAB — CORTISOL: Cortisol: 19.7 ug/dL — ABNORMAL HIGH (ref 6.2–19.4)

## 2022-06-06 NOTE — Progress Notes (Signed)
Subjective:    [name], female    DOB: [birth year], 27 y.o.   MRN: [id number]  HPI  An audio/video tele-health visit is felt to be the most appropriate encounter for this patient at this time. This is a follow up tele-visit via phone. The patient is at home. MD is at office. Prior to scheduling this appointment, our staff discussed the limitations of evaluation and management by telemedicine and the availability of in-person appointments. The patient expressed understanding and agreed to proceed.   Brianna [name] is a 27 year old woman who presents for f/u of her hypermobility- related pains, insomnia, palpitations, and dizziness.   1) Chronic nerve pain:  -Pain on average is 6/10 -Pain right now is 3/10 -She uses a heating pad. Alternates ice for short times -She has occasional flare-ups concentrated around her mid-section.  -She is currently on Cymbalta which can help with nerve pain.  -she was started on Gabapentin and felt double vision and tingling on lips.   2) Pelvic floor dysfunction: -She is starting with a new therapist soon.  -Had a pelvic MRI at [facility] and reviewed this with her and was negative.  -she asks if this is contributing to her hip pain.   3) GI symptoms -Struggled with these since she was 27 years old.   4) History of multiple suicidal ideations  5) Spasms: -present throughout her body -her chriopractor may start her on a muscle relaxer.   6) Depression:  -suffered night sweats from Cymbalta.   7) Insomnia:  -sleep has been very poor for a year.  -she feels that pain is contributing -her job ends at 10:30.  -she did not find Nortryptyline 40mg  to be very helpful -she has tried Amitriptyline in the past but cannot remember her side effect to it- thinks it may have been grogginess.   8) Anxiety: -she takes clonazepam as needed.  -has been working long hours at night without breaks -received a note saying she can only do 4  days per week.  9) ADHD: has not found much improvement in focus with Ritalin.   10) Migraines- has been following with neurology.Has not found great relief with the Rizatriptan. The Nortriptyline seems to have been making her thoughts more negative.   11) Right hip pain getting worse despite aquatic therapy -she was found to have a labral tear on MRI and has a surgical consultation on Friday.  -had arthroscopy last year -did PT and does not feel she did her best with home exercises because life has been tough -pain has been really bad in the last few weeks, last week was terrible -right now pain is 2-3 -last week it started to feel like how it felt prior to surgery -she had a steroid injection with lidocaine last week and pain felt terribly worse and she had to take it really easy the next day -he ordered an MRI which is on the 11th.   12) Palpitations -she has had since she was a child  13) Dizziness: -she often has symptoms when she stands from a seated position -she tried midodrine but experienced side effects -she does have a blood pressure cuff to check her pressures at home -she usually checks her BP when she is in a seated position.   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  Pain Inventory Average Pain 5 Pain Right Now 6 My pain is constant, sharp, dull, and tingling  In the last 24 hours, has pain interfered with the following? General activity 8 Relation with others 8 Enjoyment of life 10 What TIME of day is your pain at its worst? morning  and evening Sleep (in general) Poor  Pain is worse with: walking, inactivity, and some activites Pain improves with: heat/ice and pacing activities Relief from Meds: 0      Family History  Problem Relation Age of Onset   Diabetes Mother     Polycystic ovary syndrome Mother    GER disease Father    Heart disease Paternal Uncle    Social History   Socioeconomic History   Marital status: Single    Spouse name: Not on file   Number of children: Not on file   Years of education: Not on file   Highest education level: Not on file  Occupational History   Not on file  Tobacco Use   Smoking status: Former    Types: E-cigarettes, Cigarettes    Quit date: 2021    Years since quitting: 3.0   Smokeless tobacco: Never  Vaping Use   Vaping Use: Former   Substances: Editor, commissioning  Substance and Sexual Activity   Alcohol use: Not Currently   Drug use: Yes    Frequency: 7.0 times per week    Types: Marijuana   Sexual activity: Yes    Birth control/protection: None  Other Topics Concern   Not on file  Social History Narrative   Right handed   Social Determinants of Health   Financial Resource Strain: Not on file  Food Insecurity: Not on file  Transportation Needs: Not on file  Physical Activity: Not on file  Stress: Not on file  Social Connections: Not on file   Past Surgical History:  Procedure Laterality Date   CHOLECYSTECTOMY  05/09/2021   ESOPHAGOGASTRODUODENOSCOPY ENDOSCOPY     03/2020   HIP ARTHROSCOPY Right 04/25/2022   Procedure: RIGHT HIP ARTHROSCOPY WITH LABRAL RECONSTRUCTION, ILIOTIBIAL BAND ALLOGRAFT, PSOAS RELEASE;  Surgeon: Vanetta Mulders, MD;  Location: [place];  Service: Orthopedics;  Laterality: Right;   LABRAL REPAIR Right 03/02/2021   Procedure: RIGHT HIP ARTHROSCOPY WITH LABRAL REPAIR AND PINCE DEBRIDEMENT;  Surgeon: Vanetta Mulders, MD;  Location: [place];  Service: Orthopedics;  Laterality: Right;   SMART PILL PROCEDURE     12/2020   TYMPANOSTOMY TUBE PLACEMENT Bilateral    WISDOM TOOTH EXTRACTION     Past Medical History:  Diagnosis Date   Acute recurrent pansinusitis 10/21/2019   ADHD (attention deficit hyperactivity disorder)    inattentive   Anemia    had iron infusions in Fall  of 2023   Anxiety    Asthma    Bronchitis    Cat allergy due to both airborne and skin contact 05/01/2016   Chronic sore throat 05/01/2016   COVID 2020   Depression    Fibromyalgia    pt denies   Gastroparesis    GERD (gastroesophageal reflux disease)    History of hypotension    taken midodrine in the past- Spring of 2022   Hypermobile Ehlers-Danlos syndrome    Irregular heart rate    Keratosis pilaris    Migraine    PONV (postoperative nausea and vomiting)    "I can get really cold"   PTSD (post-traumatic stress disorder)    Seasonal allergic rhinitis 03/11/2014   There were no vitals taken for  this visit.  Opioid Risk Score:   Fall Risk Score:  `1  Depression screen [facility] 2/9     03/13/2022    2:50 PM 01/12/2022   10:07 AM 02/14/2021   12:13 PM 01/11/2021    9:23 AM 11/02/2020    3:33 PM 10/11/2020   12:31 PM 09/28/2020   11:16 AM  Depression screen PHQ 2/9  Decreased Interest 3 3 1 1 2 1 1   Down, Depressed, Hopeless 3 3 2 1 2 1 1   PHQ - 2 Score 6 6 3 2 4 2 2   Altered sleeping   3  3    Tired, decreased energy   3  3    Change in appetite   3  3    Feeling bad or failure about yourself    2  3    Trouble concentrating   3  3    Moving slowly or fidgety/restless   3  3    Suicidal thoughts  3 1  1     PHQ-9 Score   21  23    Difficult doing work/chores   Very difficult  Extremely dIfficult      Review of Systems  Constitutional:  Negative for diaphoresis and unexpected weight change.  HENT: Negative.    Eyes: Negative.   Respiratory:  Negative for shortness of breath.   Cardiovascular:  Negative for leg swelling.  Gastrointestinal:  Negative for abdominal pain, constipation, diarrhea and nausea.  Endocrine: Negative.   Genitourinary:  Positive for pelvic pain.       Bladder control  Musculoskeletal:  Positive for back pain, gait problem and myalgias.       Pain on right side of head , pain in right and left hand ,   Skin: Negative.   Allergic/Immunologic:  Negative.   Neurological:  Negative for dizziness and weakness.       Tingling  Hematological:  Does not bruise/bleed easily.  Psychiatric/Behavioral:  Positive for sleep disturbance and suicidal ideas. Negative for confusion and dysphoric mood. The patient is not nervous/anxious.   All other systems reviewed and are negative.      Objective:   Physical Exam Not performed as patient was seen via phone.     Assessment & Plan:  1) Chronic Pain Syndrome secondary to fibromyalgia -Discussed current symptoms of pain and history of pain.  -Discussed benefits of exercise in reducing pain. -Apply emu oil. -continue Lamotrigine -discussed benefits of minimizing medications.  -Lupus panel ordered.  -RA and ANA reviewed and negative.  -Discussed trigger point injections -Discussed current symptoms of pain and history of pain.  -Discussed benefits of exercise in reducing pain. -Discussed following foods that may reduce pain: 1) Ginger (especially studied for arthritis)- reduce leukotriene production to decrease inflammation 2) Blueberries- high in phytonutrients that decrease inflammation 3) Salmon- marine omega-3s reduce joint swelling and pain 4) Pumpkin seeds- reduce inflammation 5) dark chocolate- reduces inflammation 6) turmeric- reduces inflammation 7) tart cherries - reduce pain and stiffness 8) extra virgin olive oil - its compound olecanthal helps to block prostaglandins  9) chili peppers- can be eaten or applied topically via capsaicin 10) mint- helpful for headache, muscle aches, joint pain, and itching 11) garlic- reduces inflammation  Link to further information on diet for chronic pain:   2) Pelvic floor dysfunction: -continue pelvic floor therapy.  -estrogen, testosterone,  level -provided a link to a pdf of Pete Escogue's musculoskeletal alignment exercises:  .pdf   3) Depression: -she  has been going to therapy twice per week. -had negative response to Cymbalta (night sweats).  -discussed Remeron and Amitriptyline as medication options- she cannot remember why she stopped these in the pasts. Will defer until we see her blood pressure/HR trends.   4) Functional dyspepsia: -refer to gastroenterology for gastric emptying study.  -no benefits from Buspar, stop Buspar.  -discussed diet  5) contraception: -started a NuvaRing a couple of days ago.   6) pain in lower back and legs: -stop Gabapentin  7) insomnia: -did not have results with Amitriptyline 10mg  or Nortriptyline 50mg - have discontinued both -Try to go outside near sunrise -Get exercise during the day.  -Turn off all devices an hour before bedtime.  -Teas that can benefit: chamomile, valerian root, Brahmi (Bacopa) -Can consider over the counter melatonin, magnesium, and/or L-theanine. Melatonin is an anti-oxidant with multiple health benefits. Magnesium is involved in greater than 300 enzymatic reactions in the body and most of are deficient as our soil is often depleted. There are 7 different types of magnesium- Bioptemizer's is a supplement with all 7 types, and each has unique benefits. Magnesium can also help with constipation and anxiety.  -Pistachios naturally increase the production of melatonin -Cozy Earth bamboo bed sheets are free from toxic chemicals.  -Tart cherry juice or a tart cherry supplement can improve sleep and soreness post-workout    8) Foods to alleviate migraine:  1) dark leafy greens 2) avocado 3) tuna 4) samon and mackerel 5) beans and legumes Supplements that can be helpful: feverfew, B12, and magnesium  Foods to avoid in migraine: 1) Excessive (or irregular timing) coffee 2) red wine 3) aged cheeses 4) chocolate 5) citrus fruits 6) aspartame and other artifical sweeteners 7)  yeast 8) MSG (in processed foods) 9) processed and cured meats 10) nuts and certain seeds 11) chicken livers and other organ meats 12) dairy products like buttermilk, sour cream, and yogurt 13) dried fruits like dates, figs, and raisins 14) garlic 15) onions 16) potato chips 17) pickled foods like olives and sauerkraut 18) some fresh fruits like ripe banana, papaya, red plums, raspberries, kiwi, pineapple 19) tomato-based products  Recommend to keep a migraine diary: rate daily the severity of your headache (1-10) and what foods you eat that day to help determine patterns.    9) Hypotension: discontinued the midodrine. Recommended she check her BP daily while lying down, sitting up, and standing so we can observe for orthostatic hypotension.  10) Palpitations: discussed that could be secondary to dysautonomia which can respond to propanolol or metoprolol. Will check BP/HR first before trying these medications which can lower BP  11) Migraine -discussed failure of Emgality, Amitriptyline, Topamax, Klonopin, Vistaril, Fish oil   5 minutes spent in discussion of migraine medications she has failed, days she migraines per month, additional time spent calling insurance company to perform prior 

## 2022-06-07 ENCOUNTER — Encounter (HOSPITAL_BASED_OUTPATIENT_CLINIC_OR_DEPARTMENT_OTHER): Payer: BC Managed Care – PPO | Admitting: Orthopaedic Surgery

## 2022-06-07 ENCOUNTER — Ambulatory Visit (HOSPITAL_BASED_OUTPATIENT_CLINIC_OR_DEPARTMENT_OTHER): Payer: BC Managed Care – PPO | Admitting: Physical Therapy

## 2022-06-07 ENCOUNTER — Encounter (HOSPITAL_BASED_OUTPATIENT_CLINIC_OR_DEPARTMENT_OTHER): Payer: Self-pay | Admitting: Physical Therapy

## 2022-06-07 DIAGNOSIS — M25551 Pain in right hip: Secondary | ICD-10-CM

## 2022-06-07 DIAGNOSIS — M6281 Muscle weakness (generalized): Secondary | ICD-10-CM

## 2022-06-07 DIAGNOSIS — R262 Difficulty in walking, not elsewhere classified: Secondary | ICD-10-CM

## 2022-06-07 NOTE — Therapy (Signed)
OUTPATIENT PHYSICAL THERAPY LOWER EXTREMITY TREATMENT   Patient Name: Brianna [name] MRN: [id number] DOB:1995/09/11, 27 y.o., female Today's Date: 06/08/2022  END OF SESSION:  PT End of Session - 06/07/22 1103     Visit Number 8    Number of Visits 25    Date for PT Re-Evaluation 07/21/22    Authorization Type BCBS    PT Start Time 1102    PT Stop Time 1144    PT Time Calculation (min) 42 min    Activity Tolerance Patient tolerated treatment well    Behavior During Therapy [facility] for tasks assessed/performed                  Past Medical History:  Diagnosis Date   Acute recurrent pansinusitis 10/21/2019   ADHD (attention deficit hyperactivity disorder)    inattentive   Anemia    had iron infusions in Fall of 2023   Anxiety    Asthma    Bronchitis    Cat allergy due to both airborne and skin contact 05/01/2016   Chronic sore throat 05/01/2016   COVID 2020   Depression    Fibromyalgia    pt denies   Gastroparesis    GERD (gastroesophageal reflux disease)    History of hypotension    taken midodrine in the past- Spring of 2022   Hypermobile Ehlers-Danlos syndrome    Irregular heart rate    Keratosis pilaris    Migraine    PONV (postoperative nausea and vomiting)    "I can get really cold"   PTSD (post-traumatic stress disorder)    Seasonal allergic rhinitis 03/11/2014   Past Surgical History:  Procedure Laterality Date   CHOLECYSTECTOMY  05/09/2021   ESOPHAGOGASTRODUODENOSCOPY ENDOSCOPY     03/2020   HIP ARTHROSCOPY Right 04/25/2022   Procedure: RIGHT HIP ARTHROSCOPY WITH LABRAL RECONSTRUCTION, ILIOTIBIAL BAND ALLOGRAFT, PSOAS RELEASE;  Surgeon: Vanetta Mulders, MD;  Location: [place];  Service: Orthopedics;  Laterality: Right;   LABRAL REPAIR Right 03/02/2021   Procedure: RIGHT HIP ARTHROSCOPY WITH LABRAL REPAIR AND PINCE DEBRIDEMENT;  Surgeon: Vanetta Mulders, MD;  Location: [place];  Service: Orthopedics;  Laterality: Right;   SMART PILL PROCEDURE      12/2020   TYMPANOSTOMY TUBE PLACEMENT Bilateral    WISDOM TOOTH EXTRACTION     Patient Active Problem List   Diagnosis Date Noted   Cervical adenopathy 06/10/2021   Chronic pruritus 06/10/2021   Exercise-induced asthma 06/09/2021   Chronic venous insufficiency 06/03/2021   Orthostatic lightheadedness 06/03/2021   Purple toe syndrome of both feet ([place]) 06/03/2021   Autonomic dysfunction 06/03/2021   Tear of right acetabular labrum 04/13/2021   Migraine headache 03/01/2021   Headache 11/30/2020   Positive ANA (antinuclear antibody) 11/15/2020   Fibromyalgia 11/02/2020   Hair loss 11/02/2020   Muscle spasticity 10/22/2020   Hypermobile Ehlers-Danlos syndrome 10/22/2020   Paresthesia of bilateral legs 10/06/2020   Myalgia 10/06/2020   Chronic pelvic pain in female 10/06/2020   Chronic abdominal pain 10/06/2020   Irritable colon 08/02/2020   Pelvic floor dysfunction 08/02/2020   Irritant dermatitis 08/02/2020   Gastroesophageal reflux disease 06/28/2020   Severe recurrent major depression without psychotic features ([place]) 06/28/2020   Polyarthralgia 06/28/2020   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 06/28/2020   Tachycardia 06/28/2020   History of COVID-19 06/28/2020   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 deficit 06/28/2020   Recurrent major depressive disorder, in full remission ([place]) 06/28/2020   Atypical chest pain 06/22/2020  Dizziness 06/22/2020   Palpitations 06/14/2020   Anxiety 05/04/2020   Internal and external hemorrhoids without complication 04/29/2020   Nondiabetic gastroparesis 03/03/2020   ETD (Eustachian tube dysfunction), bilateral 10/21/2019   Acne vulgaris 03/11/2014   Mild intermittent asthma without complication 03/11/2014   Seasonal allergies 03/11/2014    REFERRING PROVIDER:  [name], MD     REFERRING DIAG: [phone] (ICD-10-CM) - Tear of right acetabular labrum, subsequent encounter  post op 04/25/22 R  hip labral repair revision with IT band allograft   THERAPY DIAG:  Pain in right hip  Muscle weakness (generalized)  Difficulty in walking, not elsewhere classified  Rationale for Evaluation and Treatment: Rehabilitation  ONSET DATE: DOS 04/25/22 Days since surgery: 43   SUBJECTIVE:   SUBJECTIVE STATEMENT: A little pinching when walking but doing well overall.   PERTINENT HISTORY: EDS PAIN:  Are you having pain? Yes: NPRS scale: 5/10 Pain location: rt hip Pain description: sore/achey Aggravating factors: movement Relieving factors: ice  PRECAUTIONS: Other: TDWB 2 weeks post op  WEIGHT BEARING RESTRICTIONS: TDWB 2 wk post op  FALLS:  Has patient fallen in last 6 months? 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  PLOF: Independent  PATIENT GOALS: yoga, take dance classes  OBJECTIVE:   PATIENT SURVEYS:  FOTO 27  POSTURE:flexed on crutches as expected  PALPATION: End ranges WFL  LOWER EXTREMITY ROM:  Passive ROM Right eval Right 05/23/22  Hip flexion 80 90  Hip extension 0   Hip abduction 30   Hip adduction    Hip internal rotation    Hip external rotation     (Blank rows = not tested)   GAIT: EVAL: TDWB with bil axillary crutches 05/23/22: able to ambulate without AD, locked in neutral for hip extension   TODAY'S TREATMENT:          Treatment                            06/07/22:  MANUAL: IASTM VL & ITB, STM to hip abd musculature; prone Lt upper sacral quadrant PA spring hold for pelvic rotation reduction (presented with Rt ant innom) Prone heel squeeze with ball bw feet Upright bike 5 min L3 Gastroc soleus stretch slant board Gait trainign for hip ext at toe off Beginning weight shift onto 6" step    Treatment                            06/02/22:  Scar mobilization & IASTM ITB and VL SL clams & hip abd LAQ + round back for core engagement Prone HS curl, +glut set Prone heel squeeze at 90 deg knee flexion Seated HSS Gait- glut med activation  to reduce trendelenburg  Bike 5 min              Treatment                            05/31/22:  Bike 6 min L2 MANUAL: passive hip flexion- paired with end range + slight add and post hip mobs grade 3; Grade 1 hip distraction with gentle oscillations, STM Rt hip glut med/min & TFL; prone IASTM hamstrings and gastroc Gait without crutches today and cues for heel strike for post chain stretch Seated HSS     Bent knee fallout  x20 Bridges x20     Prone hamstring curl  x20  Prone glute set x20 Prone IR/ER x20ea     LAQ x30                                                                                               PATIENT EDUCATION:  Education details: Anatomy of condition, POC, HEP, exercise form/rationale Person educated: Patient and Parent Education method: Explanation, Demonstration, Tactile cues, Verbal cues, and Handouts Education comprehension: verbalized understanding, returned demonstration, verbal cues required, tactile cues required, and needs further education  HOME EXERCISE PROGRAM:  [id number]  ASSESSMENT:  CLINICAL IMPRESSION: Pelvic rotation reduced an pt reported improvement in concordant discomfort at anterior hip. Begin pressure into surgical LE for stairs and encouraged her to progress slowly in respect to pain levels.   OBJECTIVE IMPAIRMENTS: Abnormal gait, decreased activity tolerance, decreased balance, difficulty walking, decreased ROM, decreased strength, increased muscle spasms, impaired flexibility, improper body mechanics, and pain.   ACTIVITY LIMITATIONS: carrying, lifting, bending, sitting, standing, squatting, stairs, transfers, bed mobility, bathing, dressing, and locomotion level  PARTICIPATION LIMITATIONS: meal prep, cleaning, medication management, driving, shopping, community activity, and occupation  PERSONAL FACTORS: 3+ comorbidities: previous labral repair, EDS, autonomic dysfunction  are also affecting patient's functional outcome.      GOALS: Goals reviewed with patient? Yes  SHORT TERM GOALS: Target date: 05/26/22 30s STS without pain Baseline: x12 Goal status: achieved  2.  Able to demo household ambulation without AD and with proper pattern Baseline:  Goal status: achieved    LONG TERM GOALS:   Able to demo step up on 6" step with level pelvis and proper form Baseline:  Goal status: 06/20/22  2.  Will tolerate at least 3 min on elliptical, demonstrating good tolerance to repetitive weight bearing motion Baseline:  Goal status: INITIAL Week 8 06/20/22  3.  Demonstrate proper form in at least 10 continuous lunges without increased pain Baseline:  Goal status: INITIAL Week 12 07/18/22  4.  Demonstrate gentle, double and single foot plyometric motions with good proximal form Baseline:  Goal status: INITIAL Week 12 07/18/22  5.  Pt will begin gradual return to yoga with understanding of modifications and precautions Baseline:  Goal status: INITIAL 05/18/23  6.  Pt will participate in dance classes Baseline:  Goal status: INITIAL 05/18/23    PLAN:  PT FREQUENCY: 1-2x/week  PT DURATION: 12 weeks  PLANNED INTERVENTIONS: Therapeutic exercises, Therapeutic activity, Neuromuscular re-education, Balance training, Gait training, Patient/Family education, Self Care, Joint mobilization, Stair training, Aquatic Therapy, Dry Needling, Electrical stimulation, Spinal mobilization, Cryotherapy, Moist heat, Taping, Traction, Ultrasound, Ionotophoresis 4mg /ml Dexamethasone, and Manual therapy  PLAN FOR NEXT SESSION: continue per protocol   Meya Clutter C. Mylia Pondexter PT, DPT 06/08/22 9:56 AM

## 2022-06-09 ENCOUNTER — Ambulatory Visit (HOSPITAL_BASED_OUTPATIENT_CLINIC_OR_DEPARTMENT_OTHER): Payer: BC Managed Care – PPO | Admitting: Physical Therapy

## 2022-06-09 ENCOUNTER — Encounter (HOSPITAL_BASED_OUTPATIENT_CLINIC_OR_DEPARTMENT_OTHER): Payer: Self-pay | Admitting: Physical Therapy

## 2022-06-09 DIAGNOSIS — R262 Difficulty in walking, not elsewhere classified: Secondary | ICD-10-CM

## 2022-06-09 DIAGNOSIS — M25551 Pain in right hip: Secondary | ICD-10-CM | POA: Diagnosis not present

## 2022-06-09 DIAGNOSIS — M6281 Muscle weakness (generalized): Secondary | ICD-10-CM

## 2022-06-09 NOTE — Therapy (Signed)
OUTPATIENT PHYSICAL THERAPY LOWER EXTREMITY TREATMENT   Patient Name: Brianna [name] MRN: [id number] DOB:02-Feb-1996, 27 y.o., female Today's Date: 06/09/2022  END OF SESSION:  PT End of Session - 06/09/22 1321     Visit Number 9    Number of Visits 25    Date for PT Re-Evaluation 07/21/22    Authorization Type BC[employer]    PT Start Time 1320    PT Stop Time 1358    PT Time Calculation (min) 38 min    Activity Tolerance Patient tolerated treatment well    Behavior During Therapy [facility] for tasks assessed/performed                  Past Medical History:  Diagnosis Date   Acute recurrent pansinusitis 10/21/2019   ADHD (attention deficit hyperactivity disorder)    inattentive   Anemia    had iron infusions in Fall of 2023   Anxiety    Asthma    Bronchitis    Cat allergy due to both airborne and skin contact 05/01/2016   Chronic sore throat 05/01/2016   COVID 2020   Depression    Fibromyalgia    pt denies   Gastroparesis    GERD (gastroesophageal reflux disease)    History of hypotension    taken midodrine in the past- Spring of 2022   Hypermobile Ehlers-Danlos syndrome    Irregular heart rate    Keratosis pilaris    Migraine    PONV (postoperative nausea and vomiting)    "I can get really cold"   PTSD (post-traumatic stress disorder)    Seasonal allergic rhinitis 03/11/2014   Past Surgical History:  Procedure Laterality Date   CHOLECYSTECTOMY  05/09/2021   ESOPHAGOGASTRODUODENOSCOPY ENDOSCOPY     03/2020   HIP ARTHROSCOPY Right 04/25/2022   Procedure: RIGHT HIP ARTHROSCOPY WITH LABRAL RECONSTRUCTION, ILIOTIBIAL BAND ALLOGRAFT, PSOAS RELEASE;  Surgeon: [name], MD;  Location: [facility] OR;  Service: Orthopedics;  Laterality: Right;   LABRAL REPAIR Right 03/02/2021   Procedure: RIGHT HIP ARTHROSCOPY WITH LABRAL REPAIR AND PINCE DEBRIDEMENT;  Surgeon: [name], MD;  Location: [facility] OR;  Service: Orthopedics;  Laterality: Right;   SMART PILL PROCEDURE      12/2020   TYMPANOSTOMY TUBE PLACEMENT Bilateral    WISDOM TOOTH EXTRACTION     Patient Active Problem List   Diagnosis Date Noted   Cervical adenopathy 06/10/2021   Chronic pruritus 06/10/2021   Exercise-induced asthma 06/09/2021   Chronic venous insufficiency 06/03/2021   Orthostatic lightheadedness 06/03/2021   Purple toe syndrome of both feet (HCC) 06/03/2021   Autonomic dysfunction 06/03/2021   Tear of right acetabular labrum 04/13/2021   Migraine headache 03/01/2021   Headache 11/30/2020   Positive ANA (antinuclear antibody) 11/15/2020   Fibromyalgia 11/02/2020   Hair loss 11/02/2020   Muscle spasticity 10/22/2020   Hypermobile Ehlers-Danlos syndrome 10/22/2020   Paresthesia of bilateral legs 10/06/2020   Myalgia 10/06/2020   Chronic pelvic pain in female 10/06/2020   Chronic abdominal pain 10/06/2020   Irritable colon 08/02/2020   Pelvic floor dysfunction 08/02/2020   Irritant dermatitis 08/02/2020   Gastroesophageal reflux disease 06/28/2020   Severe recurrent major depression without psychotic features (HCC) 06/28/2020   Polyarthralgia 06/28/2020   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 06/28/2020   Tachycardia 06/28/2020   History of COVID-19 06/28/2020   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 deficit 06/28/2020   Recurrent major depressive disorder, in full remission (HCC) 06/28/2020   Atypical chest pain 06/22/2020  Dizziness 06/22/2020   Palpitations 06/14/2020   Anxiety 05/04/2020   Internal and external hemorrhoids without complication 75/64/3329   Nondiabetic gastroparesis 03/03/2020   ETD (Eustachian tube dysfunction), bilateral 10/21/2019   Acne vulgaris 03/11/2014   Mild intermittent asthma without complication 51/88/4166   Seasonal allergies 03/11/2014    REFERRING PROVIDER:  Vanetta Mulders, MD     REFERRING DIAG: [phone] (ICD-10-CM) - Tear of right acetabular labrum, subsequent encounter  post op 04/25/22 R  hip labral repair revision with IT band allograft   THERAPY DIAG:  Pain in right hip  Muscle weakness (generalized)  Difficulty in walking, not elsewhere classified  Rationale for Evaluation and Treatment: Rehabilitation  ONSET DATE: DOS 04/25/22 Days since surgery: 24   SUBJECTIVE:   SUBJECTIVE STATEMENT: Feeling pretty good.   PERTINENT HISTORY: EDS PAIN:  Are you having pain? Yes: NPRS scale: 3/10 Pain location: rt hip Pain description: sore/achey Aggravating factors: movement Relieving factors: ice  PRECAUTIONS: Other: TDWB 2 weeks post op  WEIGHT BEARING RESTRICTIONS: TDWB 2 wk post op  FALLS:  Has patient fallen in last 6 months? 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  PLOF: Independent  PATIENT GOALS: yoga, take dance classes  OBJECTIVE:   PATIENT SURVEYS:  FOTO 27  POSTURE:WFL on 1/19  PALPATION: End ranges WFL  LOWER EXTREMITY ROM:  Passive ROM Right eval Right 05/23/22  Hip flexion 80 90  Hip extension 0   Hip abduction 30   Hip adduction    Hip internal rotation    Hip external rotation     (Blank rows = not tested) MMT(lb) Right 1/19 Left 1/19  Hip flexion 6.4 24.8  Hip extension    Hip abduction 22.6 30.8  Hip adduction    Hip internal rotation    Hip external rotation    Knee flexion 11.0 20.4  Knee extension 34.2 33.0   (Blank rows = not tested)   GAIT: EVAL: TDWB with bil axillary crutches 05/23/22: able to ambulate without AD, locked in neutral for hip extension 06/09/22: gait pattern without antalgia, good ROM and heel strike, no trendelenburg noted   TODAY'S TREATMENT:          Treatment                            06/09/22:  Elliptical: no resistance 3 min, using UEs IASTM Rt quads & scar mobilization Muscle testing Bridge on heels Bridge with marching Wall squat 3x 10 DF Trial of figure 4- pinching reported so returned to hooklying knee fallout   Treatment                            06/07/22:  MANUAL: IASTM VL  & ITB, STM to hip abd musculature; prone Lt upper sacral quadrant PA spring hold for pelvic rotation reduction (presented with Rt ant innom) Prone heel squeeze with ball bw feet Upright bike 5 min L3 Gastroc soleus stretch slant board Gait trainign for hip ext at toe off Beginning weight shift onto 6" step    Treatment                            06/02/22:  Scar mobilization & IASTM ITB and VL SL clams & hip abd LAQ + round back for core engagement Prone HS curl, +glut set Prone heel squeeze at 90 deg knee flexion  Seated HSS Gait- glut med activation to reduce trendelenburg  Bike 5 min              PATIENT EDUCATION:  Education details: Anatomy of condition, POC, HEP, exercise form/rationale Person educated: Patient and Parent Education method: Explanation, Demonstration, Tactile cues, Verbal cues, and Handouts Education comprehension: verbalized understanding, returned demonstration, verbal cues required, tactile cues required, and needs further education  HOME EXERCISE PROGRAM:  [id number]  ASSESSMENT:  CLINICAL IMPRESSION: Strength measurements today indicate deficits that require further attention but overall patient is progressing very well.   OBJECTIVE IMPAIRMENTS: Abnormal gait, decreased activity tolerance, decreased balance, difficulty walking, decreased ROM, decreased strength, increased muscle spasms, impaired flexibility, improper body mechanics, and pain.   ACTIVITY LIMITATIONS: carrying, lifting, bending, sitting, standing, squatting, stairs, transfers, bed mobility, bathing, dressing, and locomotion level  PARTICIPATION LIMITATIONS: meal prep, cleaning, medication management, driving, shopping, community activity, and occupation  PERSONAL FACTORS: 3+ comorbidities: previous labral repair, EDS, autonomic dysfunction  are also affecting patient's functional outcome.     GOALS: Goals reviewed with patient? Yes  SHORT TERM GOALS: Target date: 05/26/22 30s STS  without pain Baseline: x12 Goal status: achieved  2.  Able to demo household ambulation without AD and with proper pattern Baseline:  Goal status: achieved    LONG TERM GOALS:   Able to demo step up on 6" step with level pelvis and proper form Baseline:  Goal status: 06/20/22  2.  Will tolerate at least 3 min on elliptical, demonstrating good tolerance to repetitive weight bearing motion Baseline:  Goal status: INITIAL Week 8 06/20/22  3.  Demonstrate proper form in at least 10 continuous lunges without increased pain Baseline:  Goal status: INITIAL Week 12 07/18/22  4.  Demonstrate gentle, double and single foot plyometric motions with good proximal form Baseline:  Goal status: INITIAL Week 12 07/18/22  5.  Pt will begin gradual return to yoga with understanding of modifications and precautions Baseline:  Goal status: INITIAL 05/18/23  6.  Pt will participate in dance classes Baseline:  Goal status: INITIAL 05/18/23    PLAN:  PT FREQUENCY: 1-2x/week  PT DURATION: 12 weeks  PLANNED INTERVENTIONS: Therapeutic exercises, Therapeutic activity, Neuromuscular re-education, Balance training, Gait training, Patient/Family education, Self Care, Joint mobilization, Stair training, Aquatic Therapy, Dry Needling, Electrical stimulation, Spinal mobilization, Cryotherapy, Moist heat, Taping, Traction, Ultrasound, Ionotophoresis 4mg /ml Dexamethasone, and Manual therapy  PLAN FOR NEXT SESSION: continue per protocol   Emaan Gary C. Chaunice Obie PT, DPT 06/09/22 1:59 PM

## 2022-06-14 ENCOUNTER — Encounter (HOSPITAL_BASED_OUTPATIENT_CLINIC_OR_DEPARTMENT_OTHER): Payer: Self-pay

## 2022-06-14 ENCOUNTER — Ambulatory Visit (HOSPITAL_BASED_OUTPATIENT_CLINIC_OR_DEPARTMENT_OTHER): Payer: BC Managed Care – PPO | Admitting: Physical Therapy

## 2022-06-16 ENCOUNTER — Encounter (HOSPITAL_BASED_OUTPATIENT_CLINIC_OR_DEPARTMENT_OTHER): Payer: Self-pay | Admitting: Physical Therapy

## 2022-06-16 ENCOUNTER — Ambulatory Visit (HOSPITAL_BASED_OUTPATIENT_CLINIC_OR_DEPARTMENT_OTHER): Payer: BC Managed Care – PPO | Admitting: Physical Therapy

## 2022-06-16 DIAGNOSIS — M6281 Muscle weakness (generalized): Secondary | ICD-10-CM

## 2022-06-16 DIAGNOSIS — M25551 Pain in right hip: Secondary | ICD-10-CM

## 2022-06-16 DIAGNOSIS — R262 Difficulty in walking, not elsewhere classified: Secondary | ICD-10-CM

## 2022-06-16 NOTE — Therapy (Signed)
OUTPATIENT PHYSICAL THERAPY LOWER EXTREMITY TREATMENT   Patient Name: Brianna [name] MRN: [id number] DOB:[birth year], 27 y.o., female Today's Date: 06/16/2022  END OF SESSION:  PT End of Session - 06/16/22 1323     Visit Number 10    Number of Visits 25    Date for PT Re-Evaluation 07/21/22    Authorization Type [employer]    PT Start Time 1322    PT Stop Time 1400    PT Time Calculation (min) 38 min    Activity Tolerance Patient tolerated treatment well    Behavior During Therapy [facility] for tasks assessed/performed                  Past Medical History:  Diagnosis Date   Acute recurrent pansinusitis 10/21/2019   ADHD (attention deficit hyperactivity disorder)    inattentive   Anemia    had iron infusions in Fall of 2023   Anxiety    Asthma    Bronchitis    Cat allergy due to both airborne and skin contact 05/01/2016   Chronic sore throat 05/01/2016   COVID 2020   Depression    Fibromyalgia    pt denies   Gastroparesis    GERD (gastroesophageal reflux disease)    History of hypotension    taken midodrine in the past- Spring of 2022   Hypermobile Ehlers-Danlos syndrome    Irregular heart rate    Keratosis pilaris    Migraine    PONV (postoperative nausea and vomiting)    "I can get really cold"   PTSD (post-traumatic stress disorder)    Seasonal allergic rhinitis 03/11/2014   Past Surgical History:  Procedure Laterality Date   CHOLECYSTECTOMY  05/09/2021   ESOPHAGOGASTRODUODENOSCOPY ENDOSCOPY     03/2020   HIP ARTHROSCOPY Right 04/25/2022   Procedure: RIGHT HIP ARTHROSCOPY WITH LABRAL RECONSTRUCTION, ILIOTIBIAL BAND ALLOGRAFT, PSOAS RELEASE;  Surgeon: [name], MD;  Location: [facility] OR;  Service: Orthopedics;  Laterality: Right;   LABRAL REPAIR Right 03/02/2021   Procedure: RIGHT HIP ARTHROSCOPY WITH LABRAL REPAIR AND PINCE DEBRIDEMENT;  Surgeon: [name], MD;  Location: [facility] OR;  Service: Orthopedics;  Laterality: Right;   SMART PILL PROCEDURE      12/2020   TYMPANOSTOMY TUBE PLACEMENT Bilateral    WISDOM TOOTH EXTRACTION     Patient Active Problem List   Diagnosis Date Noted   Cervical adenopathy 06/10/2021   Chronic pruritus 06/10/2021   Exercise-induced asthma 06/09/2021   Chronic venous insufficiency 06/03/2021   Orthostatic lightheadedness 06/03/2021   Purple toe syndrome of both feet (HCC) 06/03/2021   Autonomic dysfunction 06/03/2021   Tear of right acetabular labrum 04/13/2021   Migraine headache 03/01/2021   Headache 11/30/2020   Positive ANA (antinuclear antibody) 11/15/2020   Fibromyalgia 11/02/2020   Hair loss 11/02/2020   Muscle spasticity 10/22/2020   Hypermobile Ehlers-Danlos syndrome 10/22/2020   Paresthesia of bilateral legs 10/06/2020   Myalgia 10/06/2020   Chronic pelvic pain in female 10/06/2020   Chronic abdominal pain 10/06/2020   Irritable colon 08/02/2020   Pelvic floor dysfunction 08/02/2020   Irritant dermatitis 08/02/2020   Gastroesophageal reflux disease 06/28/2020   Severe recurrent major depression without psychotic features (HCC) 06/28/2020   Polyarthralgia 06/28/2020   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 06/28/2020   Tachycardia 06/28/2020   History of COVID-19 06/28/2020   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 deficit 06/28/2020   Recurrent major depressive disorder, in full remission (HCC) 06/28/2020   Atypical chest pain 06/22/2020  Dizziness 06/22/2020   Palpitations 06/14/2020   Anxiety 05/04/2020   Internal and external hemorrhoids without complication 40/98/1191   Nondiabetic gastroparesis 03/03/2020   ETD (Eustachian tube dysfunction), bilateral 10/21/2019   Acne vulgaris 03/11/2014   Mild intermittent asthma without complication 47/82/9562   Seasonal allergies 03/11/2014    REFERRING PROVIDER:  Vanetta Mulders, MD     REFERRING DIAG: [phone] (ICD-10-CM) - Tear of right acetabular labrum, subsequent encounter  post op 04/25/22 R  hip labral repair revision with IT band allograft   THERAPY DIAG:  Pain in right hip  Muscle weakness (generalized)  Difficulty in walking, not elsewhere classified  Rationale for Evaluation and Treatment: Rehabilitation  ONSET DATE: DOS 04/25/22 Days since surgery: 52   SUBJECTIVE:   SUBJECTIVE STATEMENT: Just a little pain upon palpation to greater trochanter  PERTINENT HISTORY: EDS PAIN:  Are you having pain? Yes: NPRS scale: 3/10 Pain location: rt hip Pain description: sore/achey Aggravating factors: movement Relieving factors: ice  PRECAUTIONS: Other: TDWB 2 weeks post op  WEIGHT BEARING RESTRICTIONS: TDWB 2 wk post op  FALLS:  Has patient fallen in last 6 months? 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  PLOF: Independent  PATIENT GOALS: yoga, take dance classes  OBJECTIVE:   PATIENT SURVEYS:  FOTO 27  POSTURE:WFL on 1/19  PALPATION: End ranges WFL  LOWER EXTREMITY ROM:  Passive ROM Right eval Right 05/23/22  Hip flexion 80 90  Hip extension 0   Hip abduction 30   Hip adduction    Hip internal rotation    Hip external rotation     (Blank rows = not tested) MMT(lb) Right 1/19 Left 1/19  Hip flexion 6.4 24.8  Hip extension    Hip abduction 22.6 30.8  Hip adduction    Hip internal rotation    Hip external rotation    Knee flexion 11.0 20.4  Knee extension 34.2 33.0   (Blank rows = not tested)   GAIT: EVAL: TDWB with bil axillary crutches 05/23/22: able to ambulate without AD, locked in neutral for hip extension 06/09/22: gait pattern without antalgia, good ROM and heel strike, no trendelenburg noted   TODAY'S TREATMENT:          Treatment                            06/16/22:  STM Rt hip abdutors Seated figure 4 Stairs/glut activation STM to Rt VL proximal fibers Sidelying leg abd with knees flexed- tap knees/heels SLR- neutral & ER   Treatment                            06/09/22:  Elliptical: no resistance 3 min, using UEs IASTM  Rt quads & scar mobilization Muscle testing Bridge on heels Bridge with marching Wall squat 3x 10 DF Trial of figure 4- pinching reported so returned to hooklying knee fallout   Treatment                            06/07/22:  MANUAL: IASTM VL & ITB, STM to hip abd musculature; prone Lt upper sacral quadrant PA spring hold for pelvic rotation reduction (presented with Rt ant innom) Prone heel squeeze with ball bw feet Upright bike 5 min L3 Gastroc soleus stretch slant board Gait trainign for hip ext at toe off Beginning weight shift onto 6" step  Treatment                            06/02/22:  Scar mobilization & IASTM ITB and VL SL clams & hip abd LAQ + round back for core engagement Prone HS curl, +glut set Prone heel squeeze at 90 deg knee flexion Seated HSS Gait- glut med activation to reduce trendelenburg  Bike 5 min              PATIENT EDUCATION:  Education details: Anatomy of condition, POC, HEP, exercise form/rationale Person educated: Patient and Parent Education method: Explanation, Demonstration, Tactile cues, Verbal cues, and Handouts Education comprehension: verbalized understanding, returned demonstration, verbal cues required, tactile cues required, and needs further education  HOME EXERCISE PROGRAM:  [id number]  ASSESSMENT:  CLINICAL IMPRESSION: Pinching in anterior hip reduced with STM to proximal VL trigger point release. Tightness noted in TFL and glut med/min contributing to greater trochanteric bursa pain.   OBJECTIVE IMPAIRMENTS: Abnormal gait, decreased activity tolerance, decreased balance, difficulty walking, decreased ROM, decreased strength, increased muscle spasms, impaired flexibility, improper body mechanics, and pain.   ACTIVITY LIMITATIONS: carrying, lifting, bending, sitting, standing, squatting, stairs, transfers, bed mobility, bathing, dressing, and locomotion level  PARTICIPATION LIMITATIONS: meal prep, cleaning, medication  management, driving, shopping, community activity, and occupation  PERSONAL FACTORS: 3+ comorbidities: previous labral repair, EDS, autonomic dysfunction  are also affecting patient's functional outcome.     GOALS: Goals reviewed with patient? Yes  SHORT TERM GOALS: Target date: 05/26/22 30s STS without pain Baseline: x12 Goal status: achieved  2.  Able to demo household ambulation without AD and with proper pattern Baseline:  Goal status: achieved    LONG TERM GOALS:   Able to demo step up on 6" step with level pelvis and proper form Baseline:  Goal status: 06/20/22  2.  Will tolerate at least 3 min on elliptical, demonstrating good tolerance to repetitive weight bearing motion Baseline:  Goal status: INITIAL Week 8 06/20/22  3.  Demonstrate proper form in at least 10 continuous lunges without increased pain Baseline:  Goal status: INITIAL Week 12 07/18/22  4.  Demonstrate gentle, double and single foot plyometric motions with good proximal form Baseline:  Goal status: INITIAL Week 12 07/18/22  5.  Pt will begin gradual return to yoga with understanding of modifications and precautions Baseline:  Goal status: INITIAL 05/18/23  6.  Pt will participate in dance classes Baseline:  Goal status: INITIAL 05/18/23    PLAN:  PT FREQUENCY: 1-2x/week  PT DURATION: 12 weeks  PLANNED INTERVENTIONS: Therapeutic exercises, Therapeutic activity, Neuromuscular re-education, Balance training, Gait training, Patient/Family education, Self Care, Joint mobilization, Stair training, Aquatic Therapy, Dry Needling, Electrical stimulation, Spinal mobilization, Cryotherapy, Moist heat, Taping, Traction, Ultrasound, Ionotophoresis 4mg /ml Dexamethasone, and Manual therapy  PLAN FOR NEXT SESSION: continue per protocol, gross quad/abd/add strength   Marielle Mantione C. Kennedi Lizardo PT, DPT 06/16/22 2:08 PM

## 2022-06-19 ENCOUNTER — Telehealth: Payer: BC Managed Care – PPO | Admitting: Family Medicine

## 2022-06-21 ENCOUNTER — Ambulatory Visit
Payer: BC Managed Care – PPO | Attending: Student in an Organized Health Care Education/Training Program | Admitting: Physical Therapy

## 2022-06-21 ENCOUNTER — Encounter (HOSPITAL_BASED_OUTPATIENT_CLINIC_OR_DEPARTMENT_OTHER): Payer: Self-pay

## 2022-06-21 ENCOUNTER — Ambulatory Visit (HOSPITAL_BASED_OUTPATIENT_CLINIC_OR_DEPARTMENT_OTHER): Payer: BC Managed Care – PPO | Admitting: Physical Therapy

## 2022-06-21 ENCOUNTER — Encounter: Payer: Self-pay | Admitting: Physical Therapy

## 2022-06-21 ENCOUNTER — Ambulatory Visit (INDEPENDENT_AMBULATORY_CARE_PROVIDER_SITE_OTHER): Payer: BC Managed Care – PPO | Admitting: Orthopaedic Surgery

## 2022-06-21 ENCOUNTER — Telehealth: Payer: Self-pay | Admitting: Orthopaedic Surgery

## 2022-06-21 DIAGNOSIS — R262 Difficulty in walking, not elsewhere classified: Secondary | ICD-10-CM

## 2022-06-21 DIAGNOSIS — S73191D Other sprain of right hip, subsequent encounter: Secondary | ICD-10-CM

## 2022-06-21 DIAGNOSIS — M6281 Muscle weakness (generalized): Secondary | ICD-10-CM | POA: Diagnosis present

## 2022-06-21 DIAGNOSIS — M25551 Pain in right hip: Secondary | ICD-10-CM

## 2022-06-21 NOTE — Telephone Encounter (Signed)
Form completed and faxed with note

## 2022-06-21 NOTE — Progress Notes (Signed)
Post Operative Evaluation    Procedure/Date of Surgery: Right hip arthroscopy with labral reconstruction 12/5  Interval History:   06/21/2022: Presents today 6 weeks status post the above procedure.  Overall she is continuing to make dramatic improvements.  She does experience some mild soreness although this is overall very mild.  The hip is feeling stable and solid.  She is benefiting from lateral abductor massage.  PMH/PSH/Family History/Social History/Meds/Allergies:    Past Medical History:  Diagnosis Date   Acute recurrent pansinusitis 10/21/2019   ADHD (attention deficit hyperactivity disorder)    inattentive   Anemia    had iron infusions in Fall of 2023   Anxiety    Asthma    Bronchitis    Cat allergy due to both airborne and skin contact 05/01/2016   Chronic sore throat 05/01/2016   COVID 2020   Depression    Fibromyalgia    pt denies   Gastroparesis    GERD (gastroesophageal reflux disease)    History of hypotension    taken midodrine in the past- Spring of 2022   Hypermobile Ehlers-Danlos syndrome    Irregular heart rate    Keratosis pilaris    Migraine    PONV (postoperative nausea and vomiting)    "I can get really cold"   PTSD (post-traumatic stress disorder)    Seasonal allergic rhinitis 03/11/2014   Past Surgical History:  Procedure Laterality Date   CHOLECYSTECTOMY  05/09/2021   ESOPHAGOGASTRODUODENOSCOPY ENDOSCOPY     03/2020   HIP ARTHROSCOPY Right 04/25/2022   Procedure: RIGHT HIP ARTHROSCOPY WITH LABRAL RECONSTRUCTION, ILIOTIBIAL BAND ALLOGRAFT, PSOAS RELEASE;  Surgeon: Vanetta Mulders, MD;  Location: [facility];  Service: Orthopedics;  Laterality: Right;   LABRAL REPAIR Right 03/02/2021   Procedure: RIGHT HIP ARTHROSCOPY WITH LABRAL REPAIR AND PINCE DEBRIDEMENT;  Surgeon: Vanetta Mulders, MD;  Location: [facility];  Service: Orthopedics;  Laterality: Right;   SMART PILL PROCEDURE     12/2020   TYMPANOSTOMY TUBE  PLACEMENT Bilateral    WISDOM TOOTH EXTRACTION     Social History   Socioeconomic History   Marital status: Single    Spouse name: Not on file   Number of children: Not on file   Years of education: Not on file   Highest education level: Not on file  Occupational History   Not on file  Tobacco Use   Smoking status: Former    Types: E-cigarettes, Cigarettes    Quit date: 2021    Years since quitting: 3.0   Smokeless tobacco: Never  Vaping Use   Vaping Use: Former   Substances: Editor, commissioning  Substance and Sexual Activity   Alcohol use: Not Currently   Drug use: Yes    Frequency: 7.0 times per week    Types: Marijuana   Sexual activity: Yes    Birth control/protection: None  Other Topics Concern   Not on file  Social History Narrative   Right handed   Social Determinants of Health   Financial Resource Strain: Not on file  Food Insecurity: Not on file  Transportation Needs: Not on file  Physical Activity: Not on file  Stress: Not on file  Social Connections: Not on file   Family History  Problem Relation Age of Onset   Diabetes Mother    Polycystic ovary syndrome Mother  GER disease Father    Heart disease Paternal Uncle    Allergies  Allergen Reactions   Clindamycin Hives, Swelling and Rash   Tretinoin Dermatitis, Itching, Photosensitivity, Swelling and Other (See Comments)   Current Outpatient Medications  Medication Sig Dispense Refill   aspirin EC 325 MG tablet TAKE 1 TABLET BY MOUTH EVERY DAY 90 tablet 1   clobetasol (TEMOVATE) 0.05 % external solution Apply 1 Application topically daily as needed (Irritation).     clonazePAM (KLONOPIN) 1 MG tablet Take 1 mg by mouth daily as needed for anxiety.     Dextromethorphan-buPROPion ER (AUVELITY) 45-105 MG TBCR Take 1 tablet by mouth 2 (two) times daily.     Drospirenone (SLYND) 4 MG TABS Take 1 tablet by mouth daily. 28 tablet 11   finasteride (PROPECIA) 1 MG tablet Take 1 mg by mouth daily.      Galcanezumab-gnlm (EMGALITY) 120 MG/ML SOAJ Inject 120 mg into the skin every 30 (thirty) days.     loratadine (CLARITIN) 10 MG tablet Take 10 mg by mouth daily.     naratriptan (AMERGE) 2.5 MG tablet Take 2.5 mg by mouth as needed for migraine.     Omega-3 Fatty Acids (FISH OIL PO) Take 1,400 Units by mouth daily. Mini     ondansetron (ZOFRAN ODT) 4 MG disintegrating tablet Take 1 tablet (4 mg total) by mouth every 8 (eight) hours as needed for nausea or vomiting. 20 tablet 5   Pediatric Multiple Vitamins (PEDIATRIC MULTIVITAMIN) chewable tablet Chew 1 tablet by mouth daily.     promethazine (PHENERGAN) 25 MG tablet Take 1 tablet (25 mg total) by mouth every 6 (six) hours as needed for nausea or vomiting. (Patient taking differently: Take 12.5 mg by mouth every 6 (six) hours as needed for nausea or vomiting.) 30 tablet 0   tazarotene (TAZORAC) 0.05 % cream Apply 1 Application topically every other day.     tranexamic acid (LYSTEDA) 650 MG TABS tablet Take 2 tablets (1,300 mg total) by mouth 3 (three) times daily. Take during menses for a maximum of five days 30 tablet 2   triamcinolone cream (KENALOG) 0.1 % Apply 1 Application topically daily as needed (Eczma).     No current facility-administered medications for this visit.   No results found.  Review of Systems:   A ROS was performed including pertinent positives and negatives as documented in the HPI.   Musculoskeletal Exam:    Right hip incisions are healed.  Range of motion is 30 degrees internal rotation and 35 degrees external rotation without significant pain.  Improved abduction strength neurosensory exam is intact  Imaging:    None  I personally reviewed and interpreted the radiographs.   Assessment:   27 year old female status post right hip arthroscopic labral reconstruction overall doing well.  At this time she will continue to progress to the strengthening portion of the protocol.  I will plan to see her back in 4 weeks  for reassessment. Plan :    -Return to clinic 4 weeks for reassessment    I personally saw and evaluated the patient, and participated in the management and treatment plan.  Vanetta Mulders, MD Attending Physician, Orthopedic Surgery  This document was dictated using Dragon voice recognition software. A reasonable attempt at proof reading has been made to minimize errors.

## 2022-06-21 NOTE — Therapy (Unsigned)
OUTPATIENT PHYSICAL THERAPY LOWER EXTREMITY TREATMENT   Patient Name: Brianna [name] MRN: [id number] DOB:1996/05/12, 27 y.o., female Today's Date: 06/22/2022  END OF SESSION:  PT End of Session - 06/21/22 1549     Visit Number 11    Number of Visits 25    Date for PT Re-Evaluation 07/21/22    Authorization Type BCBS    Authorization Time Period FOTO v6, v10    PT Start Time 1548    PT Stop Time 0272    PT Time Calculation (min) 33 min    Activity Tolerance Patient tolerated treatment well    Behavior During Therapy [facility] for tasks assessed/performed                  Past Medical History:  Diagnosis Date   Acute recurrent pansinusitis 10/21/2019   ADHD (attention deficit hyperactivity disorder)    inattentive   Anemia    had iron infusions in Fall of 2023   Anxiety    Asthma    Bronchitis    Cat allergy due to both airborne and skin contact 05/01/2016   Chronic sore throat 05/01/2016   COVID 2020   Depression    Fibromyalgia    pt denies   Gastroparesis    GERD (gastroesophageal reflux disease)    History of hypotension    taken midodrine in the past- Spring of 2022   Hypermobile Ehlers-Danlos syndrome    Irregular heart rate    Keratosis pilaris    Migraine    PONV (postoperative nausea and vomiting)    "I can get really cold"   PTSD (post-traumatic stress disorder)    Seasonal allergic rhinitis 03/11/2014   Past Surgical History:  Procedure Laterality Date   CHOLECYSTECTOMY  05/09/2021   ESOPHAGOGASTRODUODENOSCOPY ENDOSCOPY     03/2020   HIP ARTHROSCOPY Right 04/25/2022   Procedure: RIGHT HIP ARTHROSCOPY WITH LABRAL RECONSTRUCTION, ILIOTIBIAL BAND ALLOGRAFT, PSOAS RELEASE;  Surgeon: Vanetta Mulders, MD;  Location: [place];  Service: Orthopedics;  Laterality: Right;   LABRAL REPAIR Right 03/02/2021   Procedure: RIGHT HIP ARTHROSCOPY WITH LABRAL REPAIR AND PINCE DEBRIDEMENT;  Surgeon: Vanetta Mulders, MD;  Location: [place];  Service: Orthopedics;   Laterality: Right;   SMART PILL PROCEDURE     12/2020   TYMPANOSTOMY TUBE PLACEMENT Bilateral    WISDOM TOOTH EXTRACTION     Patient Active Problem List   Diagnosis Date Noted   Cervical adenopathy 06/10/2021   Chronic pruritus 06/10/2021   Exercise-induced asthma 06/09/2021   Chronic venous insufficiency 06/03/2021   Orthostatic lightheadedness 06/03/2021   Purple toe syndrome of both feet ([place]) 06/03/2021   Autonomic dysfunction 06/03/2021   Tear of right acetabular labrum 04/13/2021   Migraine headache 03/01/2021   Headache 11/30/2020   Positive ANA (antinuclear antibody) 11/15/2020   Fibromyalgia 11/02/2020   Hair loss 11/02/2020   Muscle spasticity 10/22/2020   Hypermobile Ehlers-Danlos syndrome 10/22/2020   Paresthesia of bilateral legs 10/06/2020   Myalgia 10/06/2020   Chronic pelvic pain in female 10/06/2020   Chronic abdominal pain 10/06/2020   Irritable colon 08/02/2020   Pelvic floor dysfunction 08/02/2020   Irritant dermatitis 08/02/2020   Gastroesophageal reflux disease 06/28/2020   Severe recurrent major depression without psychotic features ([place]) 06/28/2020   Polyarthralgia 06/28/2020   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 06/28/2020   Tachycardia 06/28/2020   History of COVID-19 06/28/2020   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 deficit 06/28/2020   Recurrent major depressive disorder, in full remission (  HCC) 06/28/2020   Atypical chest pain 06/22/2020   Dizziness 06/22/2020   Palpitations 06/14/2020   Anxiety 05/04/2020   Internal and external hemorrhoids without complication 04/29/2020   Nondiabetic gastroparesis 03/03/2020   ETD (Eustachian tube dysfunction), bilateral 10/21/2019   Acne vulgaris 03/11/2014   Mild intermittent asthma without complication 03/11/2014   Seasonal allergies 03/11/2014    REFERRING PROVIDER:  [name], MD     REFERRING DIAG: [phone] (ICD-10-CM) - Tear of right acetabular  labrum, subsequent encounter  post op 04/25/22 R hip labral repair revision with IT band allograft   THERAPY DIAG:  Pain in right hip  Muscle weakness (generalized)  Difficulty in walking, not elsewhere classified  Rationale for Evaluation and Treatment: Rehabilitation  ONSET DATE: DOS 04/25/22   SUBJECTIVE:   SUBJECTIVE STATEMENT: "I have to leave a little early".  Pt with min lateral hip discomfort, 2/10.  She states she needed to have some visits at this location as she is returning to work and lives nearby, needs PM appts. She also would like to try the Reformer  Not sure if she can do Pilates after PT.   PERTINENT HISTORY: EDS PAIN:  Are you having pain? Yes: NPRS scale: 2/10 Pain location: rt hip Pain description: sore/achey Aggravating factors: movement Relieving factors: ice  PRECAUTIONS: Other: TDWB 2 weeks post op  WEIGHT BEARING RESTRICTIONS: no longer has   FALLS:  Has patient fallen in last 6 months? 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  PLOF: Independent  PATIENT GOALS: yoga, take dance classes  OBJECTIVE:   PATIENT SURVEYS:  FOTO 27  POSTURE:WFL on 1/19  PALPATION: End ranges [facility]  LOWER EXTREMITY ROM:  Passive ROM Right eval Right 05/23/22 Rt  06/21/22  Hip flexion 80 90 100 pinch   Hip extension 0    Hip abduction 30    Hip adduction     Hip internal rotation   25  Hip external rotation   45   (Blank rows = not tested) MMT(lb) Right 1/19 Left 1/19 Rt LE  06/21/22  Hip flexion 6.4 24.8   Hip extension   Can do single leg bridge 10 sec hold   Hip abduction 22.6 30.8 4+/5  Hip adduction     Hip internal rotation     Hip external rotation     Knee flexion 11.0 20.4   Knee extension 34.2 33.0    (Blank rows = not tested)   GAIT: EVAL: TDWB with bil axillary crutches 05/23/22: able to ambulate without AD, locked in neutral for hip extension 06/09/22: gait pattern without antalgia, good ROM and heel strike, no trendelenburg  noted   TODAY'S TREATMENT:         [facility] Adult PT Treatment:                                                DATE: 06/21/22 Therapeutic Exercise: Sit to stand x 15 Squat x 15 for movement assessment  Single leg stance Rt LE 30 sec  Bridge x 10 double then x 10 Rt LE single  SLR  x 10, min strain on ant. Hip  Hip abduction  x 15  Reformer : Footwork 2 red 1 blue parallel and turnout on heels, toes used band around thighs for outer hip activation Single leg work 2 red 1 blue spring  15  Bridge on Reformer x 2 R 1 blue with band x 10  Manual Therapy: PROM Rt LE for assessment  Self Care: Pilates post PT, options   Treatment                            06/16/22:  STM Rt hip abdutors Seated figure 4 Stairs/glut activation STM to Rt VL proximal fibers Sidelying leg abd with knees flexed- tap knees/heels SLR- neutral & ER   Treatment                            06/09/22:  Elliptical: no resistance 3 min, using UEs IASTM Rt quads & scar mobilization Muscle testing Bridge on heels Bridge with marching Wall squat 3x 10 DF Trial of figure 4- pinching reported so returned to hooklying knee fallout   Treatment                            06/07/22:  MANUAL: IASTM VL & ITB, STM to hip abd musculature; prone Lt upper sacral quadrant PA spring hold for pelvic rotation reduction (presented with Rt ant innom) Prone heel squeeze with ball bw feet Upright bike 5 min L3 Gastroc soleus stretch slant board Gait trainign for hip ext at toe off Beginning weight shift onto 6" step    Treatment                            06/02/22:  Scar mobilization & IASTM ITB and VL SL clams & hip abd LAQ + round back for core engagement Prone HS curl, +glut set Prone heel squeeze at 90 deg knee flexion Seated HSS Gait- glut med activation to reduce trendelenburg  Bike 5 min              PATIENT EDUCATION:  Education details: Anatomy of condition, POC, HEP, exercise form/rationale Person educated:  Patient and Parent Education method: Explanation, Demonstration, Tactile cues, Verbal cues, and Handouts Education comprehension: verbalized understanding, returned demonstration, verbal cues required, tactile cues required, and needs further education  HOME EXERCISE PROGRAM:    Access Code: [email or web address] URL: [email or web address] Date: 06/21/2022 Prepared by: Raeford Razor  Exercises - Sidelying Hip Abduction  - 3 x daily - 7 x weekly - 2 sets - 10 reps - Bridge on Heels  - 1 x daily - 7 x weekly - 2 sets - 10 reps - 3s hold - Marching Bridge  - 1 x daily - 7 x weekly - 5 sets - 5 reps - Wall Squat  - 1 x daily - 7 x weekly - 5 sets - 10 toe raises hold - Standing Gastroc Stretch  - 2 x daily - 7 x weekly - 2 sets - 30s hold - Seated Hamstring Stretch  - 2 x daily - 7 x weekly - 2 sets - 30s hold - Bent Knee Fallouts  - 2 x daily - 7 x weekly - 1 sets - 10 reps - Straight Leg Raise  - 1 x daily - 7 x weekly - 3 sets - 10 reps - Straight Leg Raise with External Rotation  - 1 x daily - 7 x weekly - 3 sets - 10 reps  ASSESSMENT:  CLINICAL IMPRESSION: Patient will be attending some visits here at this location.  She has decent strength and is  ambulating longer distances in the community with min to limp.  She does have Rt hip tightness but considering time of surgery and her predisposition for joint hypermobility/instability this may not be a negative in the long term. Protocol requested by Primary PT but at this time, strengthening will be the focus and integrating more compound movements in close chain.    OBJECTIVE IMPAIRMENTS: Abnormal gait, decreased activity tolerance, decreased balance, difficulty walking, decreased ROM, decreased strength, increased muscle spasms, impaired flexibility, improper body mechanics, and pain.   ACTIVITY LIMITATIONS: carrying, lifting, bending, sitting, standing, squatting, stairs, transfers, bed mobility, bathing, dressing, and locomotion  level  PARTICIPATION LIMITATIONS: meal prep, cleaning, medication management, driving, shopping, community activity, and occupation  PERSONAL FACTORS: 3+ comorbidities: previous labral repair, EDS, autonomic dysfunction  are also affecting patient's functional outcome.     GOALS: Goals reviewed with patient? Yes  SHORT TERM GOALS: Target date: 05/26/22 30s STS without pain Baseline: x12 Goal status: achieved  2.  Able to demo household ambulation without AD and with proper pattern Baseline:  Goal status: achieved    LONG TERM GOALS:   Able to demo step up on 6" step with level pelvis and proper form Baseline:  Goal status: 06/20/22  2.  Will tolerate at least 3 min on elliptical, demonstrating good tolerance to repetitive weight bearing motion Baseline:  Goal status: INITIAL Week 8 06/20/22  3.  Demonstrate proper form in at least 10 continuous lunges without increased pain Baseline:  Goal status: INITIAL Week 12 07/18/22  4.  Demonstrate gentle, double and single foot plyometric motions with good proximal form Baseline:  Goal status: INITIAL Week 12 07/18/22  5.  Pt will begin gradual return to group fitness with understanding of modifications and precautions Baseline:  Goal status: INITIAL 05/18/23    PLAN:  PT FREQUENCY: 1-2x/week  PT DURATION: 12 weeks  PLANNED INTERVENTIONS: Therapeutic exercises, Therapeutic activity, Neuromuscular re-education, Balance training, Gait training, Patient/Family education, Self Care, Joint mobilization, Stair training, Aquatic Therapy, Dry Needling, Electrical stimulation, Spinal mobilization, Cryotherapy, Moist heat, Taping, Traction, Ultrasound, Ionotophoresis 4mg /ml Dexamethasone, and Manual therapy  PLAN FOR NEXT SESSION: continue per protocol, gross quad/abd/add strength. FOTO? Could not find in [place] (elliptical, step ups) .    Raeford Razor, PT 06/22/22 7:47 AM Phone: [phone] Fax: [phone]

## 2022-06-21 NOTE — Telephone Encounter (Signed)
Patient seen today. Form received. Please advise work status and provide note. Thank you.

## 2022-06-23 ENCOUNTER — Encounter (HOSPITAL_BASED_OUTPATIENT_CLINIC_OR_DEPARTMENT_OTHER): Payer: Self-pay | Admitting: Physical Therapy

## 2022-06-23 ENCOUNTER — Ambulatory Visit (HOSPITAL_BASED_OUTPATIENT_CLINIC_OR_DEPARTMENT_OTHER): Payer: BC Managed Care – PPO | Attending: Orthopaedic Surgery | Admitting: Physical Therapy

## 2022-06-23 DIAGNOSIS — M6281 Muscle weakness (generalized): Secondary | ICD-10-CM

## 2022-06-23 DIAGNOSIS — M25551 Pain in right hip: Secondary | ICD-10-CM | POA: Diagnosis present

## 2022-06-23 DIAGNOSIS — R262 Difficulty in walking, not elsewhere classified: Secondary | ICD-10-CM

## 2022-06-23 NOTE — Therapy (Signed)
OUTPATIENT PHYSICAL THERAPY LOWER EXTREMITY TREATMENT   Patient Name: Brianna [name] MRN: [id number] DOB:Oct 11, 1995, 27 y.o., female Today's Date: 06/23/2022  END OF SESSION:  PT End of Session - 06/23/22 1329     Visit Number 12    Number of Visits 25    Date for PT Re-Evaluation 07/21/22    Authorization Type BCBS    Authorization Time Period FOTO v6, v10    PT Start Time 1328    PT Stop Time 1358    PT Time Calculation (min) 30 min    Activity Tolerance Patient tolerated treatment well    Behavior During Therapy [facility] for tasks assessed/performed                  Past Medical History:  Diagnosis Date   Acute recurrent pansinusitis 10/21/2019   ADHD (attention deficit hyperactivity disorder)    inattentive   Anemia    had iron infusions in Fall of 2023   Anxiety    Asthma    Bronchitis    Cat allergy due to both airborne and skin contact 05/01/2016   Chronic sore throat 05/01/2016   COVID 2020   Depression    Fibromyalgia    pt denies   Gastroparesis    GERD (gastroesophageal reflux disease)    History of hypotension    taken midodrine in the past- Spring of 2022   Hypermobile Ehlers-Danlos syndrome    Irregular heart rate    Keratosis pilaris    Migraine    PONV (postoperative nausea and vomiting)    "I can get really cold"   PTSD (post-traumatic stress disorder)    Seasonal allergic rhinitis 03/11/2014   Past Surgical History:  Procedure Laterality Date   CHOLECYSTECTOMY  05/09/2021   ESOPHAGOGASTRODUODENOSCOPY ENDOSCOPY     03/2020   HIP ARTHROSCOPY Right 04/25/2022   Procedure: RIGHT HIP ARTHROSCOPY WITH LABRAL RECONSTRUCTION, ILIOTIBIAL BAND ALLOGRAFT, PSOAS RELEASE;  Surgeon: Vanetta Mulders, MD;  Location: [place];  Service: Orthopedics;  Laterality: Right;   LABRAL REPAIR Right 03/02/2021   Procedure: RIGHT HIP ARTHROSCOPY WITH LABRAL REPAIR AND PINCE DEBRIDEMENT;  Surgeon: Vanetta Mulders, MD;  Location: [place];  Service: Orthopedics;   Laterality: Right;   SMART PILL PROCEDURE     12/2020   TYMPANOSTOMY TUBE PLACEMENT Bilateral    WISDOM TOOTH EXTRACTION     Patient Active Problem List   Diagnosis Date Noted   Cervical adenopathy 06/10/2021   Chronic pruritus 06/10/2021   Exercise-induced asthma 06/09/2021   Chronic venous insufficiency 06/03/2021   Orthostatic lightheadedness 06/03/2021   Purple toe syndrome of both feet ([place]) 06/03/2021   Autonomic dysfunction 06/03/2021   Tear of right acetabular labrum 04/13/2021   Migraine headache 03/01/2021   Headache 11/30/2020   Positive ANA (antinuclear antibody) 11/15/2020   Fibromyalgia 11/02/2020   Hair loss 11/02/2020   Muscle spasticity 10/22/2020   Hypermobile Ehlers-Danlos syndrome 10/22/2020   Paresthesia of bilateral legs 10/06/2020   Myalgia 10/06/2020   Chronic pelvic pain in female 10/06/2020   Chronic abdominal pain 10/06/2020   Irritable colon 08/02/2020   Pelvic floor dysfunction 08/02/2020   Irritant dermatitis 08/02/2020   Gastroesophageal reflux disease 06/28/2020   Severe recurrent major depression without psychotic features ([place]) 06/28/2020   Polyarthralgia 06/28/2020   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 06/28/2020   Tachycardia 06/28/2020   History of COVID-19 06/28/2020   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 deficit 06/28/2020   Recurrent major depressive disorder, in full remission (  [place]) 06/28/2020   Atypical chest pain 06/22/2020   Dizziness 06/22/2020   Palpitations 06/14/2020   Anxiety 05/04/2020   Internal and external hemorrhoids without complication 40/98/1191   Nondiabetic gastroparesis 03/03/2020   ETD (Eustachian tube dysfunction), bilateral 10/21/2019   Acne vulgaris 03/11/2014   Mild intermittent asthma without complication 47/82/9562   Seasonal allergies 03/11/2014    REFERRING PROVIDER:  Vanetta Mulders, MD     REFERRING DIAG: [phone] (ICD-10-CM) - Tear of right acetabular  labrum, subsequent encounter  post op 04/25/22 R hip labral repair revision with IT band allograft   THERAPY DIAG:  Pain in right hip  Muscle weakness (generalized)  Difficulty in walking, not elsewhere classified  Rationale for Evaluation and Treatment: Rehabilitation  ONSET DATE: DOS 04/25/22   SUBJECTIVE:   SUBJECTIVE STATEMENT: Looking into another Pilates place close time.  Overall hip is feeling tight.  PERTINENT HISTORY: EDS PAIN:  Are you having pain? Yes: NPRS scale: 2/10 Pain location: rt hip Pain description: sore/achey Aggravating factors: movement Relieving factors: ice  PRECAUTIONS: Other: TDWB 2 weeks post op  WEIGHT BEARING RESTRICTIONS: no longer has   FALLS:  Has patient fallen in last 6 months? 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  PLOF: Independent  PATIENT GOALS: yoga, take dance classes  OBJECTIVE:   PATIENT SURVEYS:  FOTO EVAL: 27 2/2: 59  POSTURE:WFL on 1/19  PALPATION: End ranges WFL  LOWER EXTREMITY ROM:  Passive ROM Right eval Right 05/23/22 Rt  06/21/22  Hip flexion 80 90 100 pinch   Hip extension 0    Hip abduction 30    Hip adduction     Hip internal rotation   25  Hip external rotation   45   (Blank rows = not tested) MMT(lb) Right 1/19 Left 1/19 Rt LE  06/21/22  Hip flexion 6.4 24.8   Hip extension   Can do single leg bridge 10 sec hold   Hip abduction 22.6 30.8 4+/5  Hip adduction     Hip internal rotation     Hip external rotation     Knee flexion 11.0 20.4   Knee extension 34.2 33.0    (Blank rows = not tested)   GAIT: EVAL: TDWB with bil axillary crutches 05/23/22: able to ambulate without AD, locked in neutral for hip extension 06/09/22: gait pattern without antalgia, good ROM and heel strike, no trendelenburg noted   TODAY'S TREATMENT:         Treatment                            06/23/22:  Manual therapy to include passive hip flexion paired with external rotation, soft tissue massage to right  piriformis in supine with hip movement, STM with hip IR ER to iliopsoas, prone hip extension with posterior to anterior hip mobilizations, STM to piriformis with IR/ER of lower extremity, perpendicular stretching to ischio-tubercle ligament   [facility] Adult PT Treatment:                                                DATE: 06/21/22 Therapeutic Exercise: Sit to stand x 15 Squat x 15 for movement assessment  Single leg stance Rt LE 30 sec  Bridge x 10 double then x 10 Rt LE single  SLR  x 10, min strain on  ant. Hip  Hip abduction  x 15  Reformer : Footwork 2 red 1 blue parallel and turnout on heels, toes used band around thighs for outer hip activation Single leg work 2 red 1 blue spring  15  Bridge on Reformer x 2 R 1 blue with band x 10  Manual Therapy: PROM Rt LE for assessment  Self Care: Pilates post PT, options   Treatment                            06/16/22:  STM Rt hip abdutors Seated figure 4 Stairs/glut activation STM to Rt VL proximal fibers Sidelying leg abd with knees flexed- tap knees/heels SLR- neutral & ER       PATIENT EDUCATION:  Education details: Teacher, music of condition, POC, HEP, exercise form/rationale Person educated: Patient and Parent Education method: Explanation, Demonstration, Tactile cues, Verbal cues, and Handouts Education comprehension: verbalized understanding, returned demonstration, verbal cues required, tactile cues required, and needs further education  HOME EXERCISE PROGRAM:    Access Code: [email or web address] URL: [email or web address] Date: 06/21/2022 Prepared by: Karie Mainland  Exercises - Sidelying Hip Abduction  - 3 x daily - 7 x weekly - 2 sets - 10 reps - Bridge on Heels  - 1 x daily - 7 x weekly - 2 sets - 10 reps - 3s hold - Marching Bridge  - 1 x daily - 7 x weekly - 5 sets - 5 reps - Wall Squat  - 1 x daily - 7 x weekly - 5 sets - 10 toe raises hold - Standing Gastroc Stretch  - 2 x daily - 7 x weekly - 2 sets - 30s hold -  Seated Hamstring Stretch  - 2 x daily - 7 x weekly - 2 sets - 30s hold - Bent Knee Fallouts  - 2 x daily - 7 x weekly - 1 sets - 10 reps - Straight Leg Raise  - 1 x daily - 7 x weekly - 3 sets - 10 reps - Straight Leg Raise with External Rotation  - 1 x daily - 7 x weekly - 3 sets - 10 reps  ASSESSMENT:  CLINICAL IMPRESSION: Able to perform active hip flexion without impingement following treatment today.  At this time will transition to treatments at [facility] for strengthening utilizing a Pilates focus and lumbopelvic stability to reduce risk of further injury.   OBJECTIVE IMPAIRMENTS: Abnormal gait, decreased activity tolerance, decreased balance, difficulty walking, decreased ROM, decreased strength, increased muscle spasms, impaired flexibility, improper body mechanics, and pain.   ACTIVITY LIMITATIONS: carrying, lifting, bending, sitting, standing, squatting, stairs, transfers, bed mobility, bathing, dressing, and locomotion level  PARTICIPATION LIMITATIONS: meal prep, cleaning, medication management, driving, shopping, community activity, and occupation  PERSONAL FACTORS: 3+ comorbidities: previous labral repair, EDS, autonomic dysfunction  are also affecting patient's functional outcome.     GOALS: Goals reviewed with patient? Yes  SHORT TERM GOALS: Target date: 05/26/22 30s STS without pain Baseline: x12 Goal status: achieved  2.  Able to demo household ambulation without AD and with proper pattern Baseline:  Goal status: achieved    LONG TERM GOALS:   Able to demo step up on 6" step with level pelvis and proper form Baseline:  Goal status: 06/20/22  2.  Will tolerate at least 3 min on elliptical, demonstrating good tolerance to repetitive weight bearing motion Baseline:  Goal status: achieved Week 8 06/20/22  3.  Demonstrate proper form in  at least 10 continuous lunges without increased pain Baseline:  Goal status: INITIAL Week 12 07/18/22  4.  Demonstrate  gentle, double and single foot plyometric motions with good proximal form Baseline:  Goal status: INITIAL Week 12 07/18/22  5.  Pt will begin gradual return to group fitness with understanding of modifications and precautions Baseline:  Goal status: INITIAL 05/18/23    PLAN:  PT FREQUENCY: 1-2x/week  PT DURATION: 12 weeks  PLANNED INTERVENTIONS: Therapeutic exercises, Therapeutic activity, Neuromuscular re-education, Balance training, Gait training, Patient/Family education, Self Care, Joint mobilization, Stair training, Aquatic Therapy, Dry Needling, Electrical stimulation, Spinal mobilization, Cryotherapy, Moist heat, Taping, Traction, Ultrasound, Ionotophoresis 4mg /ml Dexamethasone, and Manual therapy  PLAN FOR NEXT SESSION: Core stability hip abduction and extension strengthening, balance, gently progressing to plyometrics on unstable surfaces.  Saquoia Sianez C. Kaitlinn Iversen PT, DPT 06/23/22 8:02 PM

## 2022-06-26 ENCOUNTER — Encounter: Payer: Self-pay | Admitting: Physical Therapy

## 2022-06-26 ENCOUNTER — Encounter (HOSPITAL_BASED_OUTPATIENT_CLINIC_OR_DEPARTMENT_OTHER): Payer: Self-pay | Admitting: Orthopaedic Surgery

## 2022-06-26 ENCOUNTER — Encounter (HOSPITAL_BASED_OUTPATIENT_CLINIC_OR_DEPARTMENT_OTHER): Payer: Self-pay

## 2022-06-26 ENCOUNTER — Encounter: Payer: BC Managed Care – PPO | Admitting: Physical Medicine and Rehabilitation

## 2022-06-26 ENCOUNTER — Ambulatory Visit
Payer: BC Managed Care – PPO | Attending: Student in an Organized Health Care Education/Training Program | Admitting: Physical Therapy

## 2022-06-26 DIAGNOSIS — M25551 Pain in right hip: Secondary | ICD-10-CM | POA: Insufficient documentation

## 2022-06-26 DIAGNOSIS — M6281 Muscle weakness (generalized): Secondary | ICD-10-CM | POA: Insufficient documentation

## 2022-06-26 DIAGNOSIS — R262 Difficulty in walking, not elsewhere classified: Secondary | ICD-10-CM | POA: Insufficient documentation

## 2022-06-26 NOTE — Therapy (Signed)
OUTPATIENT PHYSICAL THERAPY LOWER EXTREMITY TREATMENT   Patient Name: Brianna [name] MRN: [id number] DOB:[birth year], 27 y.o., female Today's Date: 06/26/2022  END OF SESSION:  PT End of Session - 06/26/22 0809     Visit Number 13    Number of Visits 25    Date for PT Re-Evaluation 07/21/22    Authorization Type BCBS    Authorization Time Period FOTO v6, v10    PT Start Time 0805    PT Stop Time 0845    PT Time Calculation (min) 40 min    Activity Tolerance Patient tolerated treatment well    Behavior During Therapy [facility] for tasks assessed/performed                   Past Medical History:  Diagnosis Date   Acute recurrent pansinusitis 10/21/2019   ADHD (attention deficit hyperactivity disorder)    inattentive   Anemia    had iron infusions in Fall of 2023   Anxiety    Asthma    Bronchitis    Cat allergy due to both airborne and skin contact 05/01/2016   Chronic sore throat 05/01/2016   COVID 2020   Depression    Fibromyalgia    pt denies   Gastroparesis    GERD (gastroesophageal reflux disease)    History of hypotension    taken midodrine in the past- Spring of 2022   Hypermobile Ehlers-Danlos syndrome    Irregular heart rate    Keratosis pilaris    Migraine    PONV (postoperative nausea and vomiting)    "I can get really cold"   PTSD (post-traumatic stress disorder)    Seasonal allergic rhinitis 03/11/2014   Past Surgical History:  Procedure Laterality Date   CHOLECYSTECTOMY  05/09/2021   ESOPHAGOGASTRODUODENOSCOPY ENDOSCOPY     03/2020   HIP ARTHROSCOPY Right 04/25/2022   Procedure: RIGHT HIP ARTHROSCOPY WITH LABRAL RECONSTRUCTION, ILIOTIBIAL BAND ALLOGRAFT, PSOAS RELEASE;  Surgeon: Vanetta Mulders, MD;  Location: [place];  Service: Orthopedics;  Laterality: Right;   LABRAL REPAIR Right 03/02/2021   Procedure: RIGHT HIP ARTHROSCOPY WITH LABRAL REPAIR AND PINCE DEBRIDEMENT;  Surgeon: Vanetta Mulders, MD;  Location: [place];  Service: Orthopedics;   Laterality: Right;   SMART PILL PROCEDURE     12/2020   TYMPANOSTOMY TUBE PLACEMENT Bilateral    WISDOM TOOTH EXTRACTION     Patient Active Problem List   Diagnosis Date Noted   Cervical adenopathy 06/10/2021   Chronic pruritus 06/10/2021   Exercise-induced asthma 06/09/2021   Chronic venous insufficiency 06/03/2021   Orthostatic lightheadedness 06/03/2021   Purple toe syndrome of both feet ([place]) 06/03/2021   Autonomic dysfunction 06/03/2021   Tear of right acetabular labrum 04/13/2021   Migraine headache 03/01/2021   Headache 11/30/2020   Positive ANA (antinuclear antibody) 11/15/2020   Fibromyalgia 11/02/2020   Hair loss 11/02/2020   Muscle spasticity 10/22/2020   Hypermobile Ehlers-Danlos syndrome 10/22/2020   Paresthesia of bilateral legs 10/06/2020   Myalgia 10/06/2020   Chronic pelvic pain in female 10/06/2020   Chronic abdominal pain 10/06/2020   Irritable colon 08/02/2020   Pelvic floor dysfunction 08/02/2020   Irritant dermatitis 08/02/2020   Gastroesophageal reflux disease 06/28/2020   Severe recurrent major depression without psychotic features ([place]) 06/28/2020   Polyarthralgia 06/28/2020   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 06/28/2020   Tachycardia 06/28/2020   History of COVID-19 06/28/2020   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 deficit 06/28/2020   Recurrent major depressive disorder, in full  remission ([place]) 06/28/2020   Atypical chest pain 06/22/2020   Dizziness 06/22/2020   Palpitations 06/14/2020   Anxiety 05/04/2020   Internal and external hemorrhoids without complication 09/98/3382   Nondiabetic gastroparesis 03/03/2020   ETD (Eustachian tube dysfunction), bilateral 10/21/2019   Acne vulgaris 03/11/2014   Mild intermittent asthma without complication 50/53/9767   Seasonal allergies 03/11/2014    REFERRING PROVIDER:  Vanetta Mulders, MD     REFERRING DIAG: [phone] (ICD-10-CM) - Tear of right acetabular  labrum, subsequent encounter  post op 04/25/22 R hip labral repair revision with IT band allograft   THERAPY DIAG:  Pain in right hip  Muscle weakness (generalized)  Difficulty in walking, not elsewhere classified  Rationale for Evaluation and Treatment: Rehabilitation  ONSET DATE: DOS 04/25/22   SUBJECTIVE:   SUBJECTIVE STATEMENT:  Hip feels tight, can you mobilize my hip?  PERTINENT HISTORY: EDS PAIN:  Are you having pain? Yes: NPRS scale: 2/10 Pain location: rt hip Pain description: sore/achey Aggravating factors: movement Relieving factors: ice  PRECAUTIONS: Other: TDWB 2 weeks post op  WEIGHT BEARING RESTRICTIONS: no longer has   FALLS:  Has patient fallen in last 6 months? 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  PLOF: Independent  PATIENT GOALS: yoga, take dance classes  OBJECTIVE:   PATIENT SURVEYS:  FOTO EVAL: 27 2/2: 59  POSTURE:WFL on 1/19  PALPATION: End ranges [facility]  LOWER EXTREMITY ROM:  Passive ROM Right eval Right 05/23/22 Rt  06/21/22  Hip flexion 80 90 100 pinch   Hip extension 0    Hip abduction 30    Hip adduction     Hip internal rotation   25  Hip external rotation   45   (Blank rows = not tested) MMT(lb) Right 1/19 Left 1/19 Rt LE  06/21/22  Hip flexion 6.4 24.8   Hip extension   Can do single leg bridge 10 sec hold   Hip abduction 22.6 30.8 4+/5  Hip adduction     Hip internal rotation     Hip external rotation     Knee flexion 11.0 20.4   Knee extension 34.2 33.0    (Blank rows = not tested)   GAIT: EVAL: TDWB with bil axillary crutches 05/23/22: able to ambulate without AD, locked in neutral for hip extension 06/09/22: gait pattern without antalgia, good ROM and heel strike, no trendelenburg noted   TODAY'S TREATMENT:        [facility] Adult PT Treatment:                                                DATE: 06/26/22 Therapeutic Exercise: Recumbent bike L2 , 5 min  Rt hip flexor off table Wide hip ER/IR x 10  Banded bridge  5 sec hold  x 10  Banded bridge march  x 10  SL on forearm clam x 15  SL plank x 30 sec  Q-ped hip extension 2 sets Rt LE  Q-ped clam and hydrant 2 sets blue band  Squats with band x 15  Lateral band walk 6 x 10 blue band  Step up 8 inch no wgt. X 10 each side  Repeat with 10 lbs Rt LE  x 10 Leg press 1.5 plates for LE strength double leg and then single leg 1 plate x 10   Manual Therapy: Posterior to anterior hip mobilizations  Rt knee flexion edge of mat (PROM) PROM Rt hip in prone with compression to glutes, knee flexion , ER/IR not in end range   Treatment                            06/23/22:  Manual therapy to include passive hip flexion paired with external rotation, soft tissue massage to right piriformis in supine with hip movement, STM with hip IR ER to iliopsoas, prone hip extension with posterior to anterior hip mobilizations, STM to piriformis with IR/ER of lower extremity, perpendicular stretching to ischio-tubercle ligament   [facility] Adult PT Treatment:                                                DATE: 06/21/22 Therapeutic Exercise: Sit to stand x 15 Squat x 15 for movement assessment  Single leg stance Rt LE 30 sec  Bridge x 10 double then x 10 Rt LE single  SLR  x 10, min strain on ant. Hip  Hip abduction  x 15  Reformer : Footwork 2 red 1 blue parallel and turnout on heels, toes used band around thighs for outer hip activation Single leg work 2 red 1 blue spring  15  Bridge on Reformer x 2 R 1 blue with band x 10  Manual Therapy: PROM Rt LE for assessment  Self Care: Pilates post PT, options   Treatment                            06/16/22:  STM Rt hip abdutors Seated figure 4 Stairs/glut activation STM to Rt VL proximal fibers Sidelying leg abd with knees flexed- tap knees/heels SLR- neutral & ER       PATIENT EDUCATION:  Education details: Teacher, music of condition, POC, HEP, exercise form/rationale Person educated: Patient and Parent Education method:  Explanation, Demonstration, Tactile cues, Verbal cues, and Handouts Education comprehension: verbalized understanding, returned demonstration, verbal cues required, tactile cues required, and needs further education  HOME EXERCISE PROGRAM:    Access Code: [email or web address] URL: [email or web address] Date: 06/21/2022 Prepared by: Karie Mainland  Exercises - Sidelying Hip Abduction  - 3 x daily - 7 x weekly - 2 sets - 10 reps - Bridge on Heels  - 1 x daily - 7 x weekly - 2 sets - 10 reps - 3s hold - Marching Bridge  - 1 x daily - 7 x weekly - 5 sets - 5 reps - Wall Squat  - 1 x daily - 7 x weekly - 5 sets - 10 toe raises hold - Standing Gastroc Stretch  - 2 x daily - 7 x weekly - 2 sets - 30s hold - Seated Hamstring Stretch  - 2 x daily - 7 x weekly - 2 sets - 30s hold - Bent Knee Fallouts  - 2 x daily - 7 x weekly - 1 sets - 10 reps - Straight Leg Raise  - 1 x daily - 7 x weekly - 3 sets - 10 reps - Straight Leg Raise with External Rotation  - 1 x daily - 7 x weekly - 3 sets - 10 reps  ASSESSMENT:  CLINICAL IMPRESSION: Patient able to work on hip mobility and strength without increasing pain.  She has the feeling of tightness  in her Rt hip but discussed the need to work on strength vs flexibility given her EDS.  Cont POC.    OBJECTIVE IMPAIRMENTS: Abnormal gait, decreased activity tolerance, decreased balance, difficulty walking, decreased ROM, decreased strength, increased muscle spasms, impaired flexibility, improper body mechanics, and pain.   ACTIVITY LIMITATIONS: carrying, lifting, bending, sitting, standing, squatting, stairs, transfers, bed mobility, bathing, dressing, and locomotion level  PARTICIPATION LIMITATIONS: meal prep, cleaning, medication management, driving, shopping, community activity, and occupation  PERSONAL FACTORS: 3+ comorbidities: previous labral repair, EDS, autonomic dysfunction  are also affecting patient's functional outcome.     GOALS: Goals  reviewed with patient? Yes  SHORT TERM GOALS: Target date: 05/26/22 30s STS without pain Baseline: x12 Goal status: achieved  2.  Able to demo household ambulation without AD and with proper pattern Baseline:  Goal status: achieved    LONG TERM GOALS:   Able to demo step up on 6" step with level pelvis and proper form Baseline:  Goal status: 06/20/22  2.  Will tolerate at least 3 min on elliptical, demonstrating good tolerance to repetitive weight bearing motion Baseline:  Goal status: achieved Week 8 06/20/22  3.  Demonstrate proper form in at least 10 continuous lunges without increased pain Baseline:  Goal status: INITIAL Week 12 07/18/22  4.  Demonstrate gentle, double and single foot plyometric motions with good proximal form Baseline:  Goal status: INITIAL Week 12 07/18/22  5.  Pt will begin gradual return to group fitness with understanding of modifications and precautions Baseline:  Goal status: INITIAL 05/18/23    PLAN:  PT FREQUENCY: 1-2x/week  PT DURATION: 12 weeks  PLANNED INTERVENTIONS: Therapeutic exercises, Therapeutic activity, Neuromuscular re-education, Balance training, Gait training, Patient/Family education, Self Care, Joint mobilization, Stair training, Aquatic Therapy, Dry Needling, Electrical stimulation, Spinal mobilization, Cryotherapy, Moist heat, Taping, Traction, Ultrasound, Ionotophoresis 4mg /ml Dexamethasone, and Manual therapy  PLAN FOR NEXT SESSION: Elliptical. Core stability hip abduction and extension strengthening, balance, gently progressing to plyometrics on unstable surfaces.  Raeford Razor, PT 06/26/22 10:56 AM Phone: [phone] Fax: [phone]

## 2022-06-27 NOTE — Therapy (Unsigned)
OUTPATIENT PHYSICAL THERAPY LOWER EXTREMITY TREATMENT   Patient Name: Brianna [name] MRN: [id number] DOB:29-Jan-1996, 27 y.o., female Today's Date: 06/28/2022  END OF SESSION:  PT End of Session - 06/28/22 1553     Visit Number 14    Number of Visits 25    Date for PT Re-Evaluation 07/21/22    Authorization Type BCBS    Authorization Time Period FOTO v6, v10    PT Start Time 1550    PT Stop Time 1630    PT Time Calculation (min) 40 min    Activity Tolerance Patient tolerated treatment well    Behavior During Therapy [facility] for tasks assessed/performed                    Past Medical History:  Diagnosis Date   Acute recurrent pansinusitis 10/21/2019   ADHD (attention deficit hyperactivity disorder)    inattentive   Anemia    had iron infusions in Fall of 2023   Anxiety    Asthma    Bronchitis    Cat allergy due to both airborne and skin contact 05/01/2016   Chronic sore throat 05/01/2016   COVID 2020   Depression    Fibromyalgia    pt denies   Gastroparesis    GERD (gastroesophageal reflux disease)    History of hypotension    taken midodrine in the past- Spring of 2022   Hypermobile Ehlers-Danlos syndrome    Irregular heart rate    Keratosis pilaris    Migraine    PONV (postoperative nausea and vomiting)    "I can get really cold"   PTSD (post-traumatic stress disorder)    Seasonal allergic rhinitis 03/11/2014   Past Surgical History:  Procedure Laterality Date   CHOLECYSTECTOMY  05/09/2021   ESOPHAGOGASTRODUODENOSCOPY ENDOSCOPY     03/2020   HIP ARTHROSCOPY Right 04/25/2022   Procedure: RIGHT HIP ARTHROSCOPY WITH LABRAL RECONSTRUCTION, ILIOTIBIAL BAND ALLOGRAFT, PSOAS RELEASE;  Surgeon: Vanetta Mulders, MD;  Location: [place];  Service: Orthopedics;  Laterality: Right;   LABRAL REPAIR Right 03/02/2021   Procedure: RIGHT HIP ARTHROSCOPY WITH LABRAL REPAIR AND PINCE DEBRIDEMENT;  Surgeon: Vanetta Mulders, MD;  Location: [place];  Service:  Orthopedics;  Laterality: Right;   SMART PILL PROCEDURE     12/2020   TYMPANOSTOMY TUBE PLACEMENT Bilateral    WISDOM TOOTH EXTRACTION     Patient Active Problem List   Diagnosis Date Noted   Cervical adenopathy 06/10/2021   Chronic pruritus 06/10/2021   Exercise-induced asthma 06/09/2021   Chronic venous insufficiency 06/03/2021   Orthostatic lightheadedness 06/03/2021   Purple toe syndrome of both feet ([place]) 06/03/2021   Autonomic dysfunction 06/03/2021   Tear of right acetabular labrum 04/13/2021   Migraine headache 03/01/2021   Headache 11/30/2020   Positive ANA (antinuclear antibody) 11/15/2020   Fibromyalgia 11/02/2020   Hair loss 11/02/2020   Muscle spasticity 10/22/2020   Hypermobile Ehlers-Danlos syndrome 10/22/2020   Paresthesia of bilateral legs 10/06/2020   Myalgia 10/06/2020   Chronic pelvic pain in female 10/06/2020   Chronic abdominal pain 10/06/2020   Irritable colon 08/02/2020   Pelvic floor dysfunction 08/02/2020   Irritant dermatitis 08/02/2020   Gastroesophageal reflux disease 06/28/2020   Severe recurrent major depression without psychotic features ([place]) 06/28/2020   Polyarthralgia 06/28/2020   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 06/28/2020   Tachycardia 06/28/2020   History of COVID-19 06/28/2020   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 deficit 06/28/2020   Recurrent major depressive disorder, in  full remission ([place]) 06/28/2020   Atypical chest pain 06/22/2020   Dizziness 06/22/2020   Palpitations 06/14/2020   Anxiety 05/04/2020   Internal and external hemorrhoids without complication 97/35/3299   Nondiabetic gastroparesis 03/03/2020   ETD (Eustachian tube dysfunction), bilateral 10/21/2019   Acne vulgaris 03/11/2014   Mild intermittent asthma without complication 24/26/8341   Seasonal allergies 03/11/2014    REFERRING PROVIDER:  Vanetta Mulders, MD     REFERRING DIAG: [id number] (ICD-10-CM) - Tear of right  acetabular labrum, subsequent encounter  post op 04/25/22 R hip labral repair revision with IT band allograft   THERAPY DIAG:  Pain in right hip  Muscle weakness (generalized)  Difficulty in walking, not elsewhere classified  Rationale for Evaluation and Treatment: Rehabilitation  ONSET DATE: DOS 04/25/22   SUBJECTIVE:   SUBJECTIVE STATEMENT:  Mild pain, soreness today.    PERTINENT HISTORY: EDS PAIN:  Are you having pain? Yes: NPRS scale: 2/10 Pain location: rt hip Pain description: sore/achey Aggravating factors: movement Relieving factors: ice  PRECAUTIONS: Other: TDWB 2 weeks post op  WEIGHT BEARING RESTRICTIONS: no longer has   FALLS:  Has patient fallen in last 6 months? 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  PLOF: Independent  PATIENT GOALS: yoga, take dance classes  OBJECTIVE:   PATIENT SURVEYS:  FOTO EVAL: 27 2/2: 59  POSTURE:WFL on 1/19  PALPATION: End ranges WFL  LOWER EXTREMITY ROM:  Passive ROM Right eval Right 05/23/22 Rt  06/21/22  Hip flexion 80 90 100 pinch   Hip extension 0    Hip abduction 30    Hip adduction     Hip internal rotation   25  Hip external rotation   45   (Blank rows = not tested) MMT(lb) Right 1/19 Left 1/19 Rt LE  06/21/22  Hip flexion 6.4 24.8   Hip extension   Can do single leg bridge 10 sec hold   Hip abduction 22.6 30.8 4+/5  Hip adduction     Hip internal rotation     Hip external rotation     Knee flexion 11.0 20.4   Knee extension 34.2 33.0    (Blank rows = not tested)   GAIT: EVAL: TDWB with bil axillary crutches 05/23/22: able to ambulate without AD, locked in neutral for hip extension 06/09/22: gait pattern without antalgia, good ROM and heel strike, no trendelenburg noted   TODAY'S TREATMENT:        [facility] Adult PT Treatment:                                                DATE: 06/27/22 Therapeutic Exercise: 5/5 hip extension and glutes Reformer  Feet in straps 1 red 1 blue: arcs in parallel and  turnout Circles in each direction  x 10 parallel  Squat (frog)  x 10 Single leg feet in straps 1 red arcs, single leg stretch  Footwork 2 red 1 blue 1 yellow  Heels with ball between knees Toes with ball between toes  Heel raise  Single leg press out ball under pelvis 2 red 1 blue Manual Therapy: IASTM to Rt post lateral hip and thigh   [facility] Adult PT Treatment:  DATE: 06/26/22 Therapeutic Exercise: Recumbent bike L2 , 5 min  Rt hip flexor off table Wide hip ER/IR x 10  Banded bridge 5 sec hold  x 10  Banded bridge march  x 10  SL on forearm clam x 15  SL plank x 30 sec  Q-ped hip extension 2 sets Rt LE  Q-ped clam and hydrant 2 sets blue band  Squats with band x 15  Lateral band walk 6 x 10 blue band  Step up 8 inch no wgt. X 10 each side  Repeat with 10 lbs Rt LE  x 10 Leg press 1.5 plates for LE strength double leg and then single leg 1 plate x 10   Manual Therapy: Posterior to anterior hip mobilizations Rt knee flexion edge of mat (PROM) PROM Rt hip in prone with compression to glutes, knee flexion , ER/IR not in end range   Treatment                            06/23/22:  Manual therapy to include passive hip flexion paired with external rotation, soft tissue massage to right piriformis in supine with hip movement, STM with hip IR ER to iliopsoas, prone hip extension with posterior to anterior hip mobilizations, STM to piriformis with IR/ER of lower extremity, perpendicular stretching to ischio-tubercle ligament   [facility] Adult PT Treatment:                                                DATE: 06/21/22 Therapeutic Exercise: Sit to stand x 15 Squat x 15 for movement assessment  Single leg stance Rt LE 30 sec  Bridge x 10 double then x 10 Rt LE single  SLR  x 10, min strain on ant. Hip  Hip abduction  x 15  Reformer : Footwork 2 red 1 blue parallel and turnout on heels, toes used band around thighs for outer hip activation Single  leg work 2 red 1 blue spring  15  Bridge on Reformer x 2 R 1 blue with band x 10  Manual Therapy: PROM Rt LE for assessment  Self Care: Pilates post PT, options   Treatment                            06/16/22:  STM Rt hip abdutors Seated figure 4 Stairs/glut activation STM to Rt VL proximal fibers Sidelying leg abd with knees flexed- tap knees/heels SLR- neutral & ER       PATIENT EDUCATION:  Education details: Geophysicist/field seismologist of condition, POC, HEP, exercise form/rationale Person educated: Patient and Parent Education method: Explanation, Demonstration, Tactile cues, Verbal cues, and Handouts Education comprehension: verbalized understanding, returned demonstration, verbal cues required, tactile cues required, and needs further education  HOME EXERCISE PROGRAM:    Access Code: [email or web address] URL: [email or web address] Date: 06/21/2022 Prepared by: Raeford Razor  Exercises - Sidelying Hip Abduction  - 3 x daily - 7 x weekly - 2 sets - 10 reps - Bridge on Heels  - 1 x daily - 7 x weekly - 2 sets - 10 reps - 3s hold - Marching Bridge  - 1 x daily - 7 x weekly - 5 sets - 5 reps - Wall Squat  - 1 x daily - 7 x weekly -  5 sets - 10 toe raises hold - Standing Gastroc Stretch  - 2 x daily - 7 x weekly - 2 sets - 30s hold - Seated Hamstring Stretch  - 2 x daily - 7 x weekly - 2 sets - 30s hold - Bent Knee Fallouts  - 2 x daily - 7 x weekly - 1 sets - 10 reps - Straight Leg Raise  - 1 x daily - 7 x weekly - 3 sets - 10 reps - Straight Leg Raise with External Rotation  - 1 x daily - 7 x weekly - 3 sets - 10 reps  ASSESSMENT:  CLINICAL IMPRESSION:  Patient able to perform intermediate Pilates exercises with min pain with greater range of leg arcs (abd/ER).  Pain resolved with change of exercise. Min fatigue overall, good tolerance. Performed brief IASTM prior to Pilates work, noted no increased muscle tension beyond what is normal.  Discomfort with hip external rotation in prone  as well.   OBJECTIVE IMPAIRMENTS: Abnormal gait, decreased activity tolerance, decreased balance, difficulty walking, decreased ROM, decreased strength, increased muscle spasms, impaired flexibility, improper body mechanics, and pain.   ACTIVITY LIMITATIONS: carrying, lifting, bending, sitting, standing, squatting, stairs, transfers, bed mobility, bathing, dressing, and locomotion level  PARTICIPATION LIMITATIONS: meal prep, cleaning, medication management, driving, shopping, community activity, and occupation  PERSONAL FACTORS: 3+ comorbidities: previous labral repair, EDS, autonomic dysfunction  are also affecting patient's functional outcome.     GOALS: Goals reviewed with patient? Yes  SHORT TERM GOALS: Target date: 05/26/22 30s STS without pain Baseline: x12 Goal status: achieved  2.  Able to demo household ambulation without AD and with proper pattern Baseline:  Goal status: achieved    LONG TERM GOALS:   Able to demo step up on 6" step with level pelvis and proper form Baseline:  Goal status: 06/20/22  2.  Will tolerate at least 3 min on elliptical, demonstrating good tolerance to repetitive weight bearing motion Baseline:  Goal status: achieved Week 8 06/20/22  3.  Demonstrate proper form in at least 10 continuous lunges without increased pain Baseline:  Goal status: INITIAL Week 12 07/18/22  4.  Demonstrate gentle, double and single foot plyometric motions with good proximal form Baseline:  Goal status: INITIAL Week 12 07/18/22  5.  Pt will begin gradual return to group fitness with understanding of modifications and precautions Baseline:  Goal status: INITIAL 05/18/23    PLAN:  PT FREQUENCY: 1-2x/week  PT DURATION: 12 weeks  PLANNED INTERVENTIONS: Therapeutic exercises, Therapeutic activity, Neuromuscular re-education, Balance training, Gait training, Patient/Family education, Self Care, Joint mobilization, Stair training, Aquatic Therapy, Dry Needling,  Electrical stimulation, Spinal mobilization, Cryotherapy, Moist heat, Taping, Traction, Ultrasound, Ionotophoresis 4mg /ml Dexamethasone, and Manual therapy  PLAN FOR NEXT SESSION: Elliptical. Core stability hip abduction and extension strengthening, balance, gently progressing to plyometrics on unstable surfaces.  , PT 06/28/22 3:55 PM Phone: [phone] Fax: [phone]

## 2022-06-28 ENCOUNTER — Encounter (HOSPITAL_BASED_OUTPATIENT_CLINIC_OR_DEPARTMENT_OTHER): Payer: BC Managed Care – PPO | Admitting: Physical Therapy

## 2022-06-28 ENCOUNTER — Encounter (HOSPITAL_BASED_OUTPATIENT_CLINIC_OR_DEPARTMENT_OTHER): Payer: Self-pay | Admitting: Physical Therapy

## 2022-06-28 ENCOUNTER — Ambulatory Visit: Payer: BC Managed Care – PPO | Admitting: Physical Therapy

## 2022-06-28 ENCOUNTER — Encounter: Payer: Self-pay | Admitting: Physical Therapy

## 2022-06-28 ENCOUNTER — Encounter (HOSPITAL_BASED_OUTPATIENT_CLINIC_OR_DEPARTMENT_OTHER): Payer: Self-pay | Admitting: Orthopaedic Surgery

## 2022-06-28 DIAGNOSIS — M25551 Pain in right hip: Secondary | ICD-10-CM

## 2022-06-28 DIAGNOSIS — R262 Difficulty in walking, not elsewhere classified: Secondary | ICD-10-CM

## 2022-06-28 DIAGNOSIS — M6281 Muscle weakness (generalized): Secondary | ICD-10-CM

## 2022-06-30 ENCOUNTER — Encounter
Payer: BC Managed Care – PPO | Attending: Physical Medicine and Rehabilitation | Admitting: Physical Medicine and Rehabilitation

## 2022-06-30 ENCOUNTER — Encounter (HOSPITAL_BASED_OUTPATIENT_CLINIC_OR_DEPARTMENT_OTHER): Payer: BC Managed Care – PPO | Admitting: Physical Therapy

## 2022-06-30 VITALS — BP 108/74 | HR 72 | Ht 64.0 in | Wt 114.0 lb

## 2022-06-30 DIAGNOSIS — G43E11 Chronic migraine with aura, intractable, with status migrainosus: Secondary | ICD-10-CM | POA: Diagnosis present

## 2022-06-30 MED ORDER — ONABOTULINUMTOXINA 100 UNITS IJ SOLR
200.0000 [IU] | Freq: Once | INTRAMUSCULAR | Status: AC
  Administered 2022-06-30: 200 [IU] via INTRAMUSCULAR

## 2022-06-30 NOTE — Progress Notes (Signed)
Botox Injection for chronic migraine headaches   Dilution: 100 Units/ 16m preservative free NS x2 Indication: refractory headaches (At least 15 days per month/headache lasting greater than 4 hours per day) incompletely responsive to other more conservative measures.  Informed consent was obtained after describing risks and benefits of the procedure with the patient. This includes bleeding, bruising, infection, excessive weakness, or medication side effects. A REMS form is on file and signed. Needle: 30g 1/2 inch needle    Number of units per muscle:  Right temporalis 20 units, 4 access points Left temporalis 20 units,  4 access points Right frontalis 10 units, 2 access points Left frontalis 10 units, 2 access points Procerus 5 units, 1 access point Right corrugator 5 units, 1 access point Left corrugator 5 units, 1 access point Right occipitalis 15 units, 3 access points Left occipitalis 15 units, 3 access points Right cervical paraspinal 10 units, 2 access points Left cervical paraspinals 10 units, 2 access points  Right trapezius 15 units, 3 access points Left trapezius 15 units, 3 access points   Remaining 45 units was discarded.    All injections were done after  after negative drawback for blood. The patient tolerated the procedure well. Post procedure instructions were given. A followup appointment was made.

## 2022-07-03 ENCOUNTER — Ambulatory Visit: Payer: BC Managed Care – PPO | Admitting: Physical Therapy

## 2022-07-03 ENCOUNTER — Encounter: Payer: BC Managed Care – PPO | Admitting: Physical Medicine and Rehabilitation

## 2022-07-04 ENCOUNTER — Ambulatory Visit (HOSPITAL_BASED_OUTPATIENT_CLINIC_OR_DEPARTMENT_OTHER): Payer: BC Managed Care – PPO | Admitting: Physical Therapy

## 2022-07-04 ENCOUNTER — Encounter (HOSPITAL_BASED_OUTPATIENT_CLINIC_OR_DEPARTMENT_OTHER): Payer: Self-pay | Admitting: Physical Therapy

## 2022-07-04 DIAGNOSIS — R262 Difficulty in walking, not elsewhere classified: Secondary | ICD-10-CM

## 2022-07-04 DIAGNOSIS — M6281 Muscle weakness (generalized): Secondary | ICD-10-CM

## 2022-07-04 DIAGNOSIS — M25551 Pain in right hip: Secondary | ICD-10-CM | POA: Diagnosis not present

## 2022-07-04 NOTE — Therapy (Signed)
OUTPATIENT PHYSICAL THERAPY LOWER EXTREMITY TREATMENT   Patient Name: Brianna [name] MRN: [id number] DOB:18-Mar-1996, 27 y.o., female Today's Date: 07/04/2022  END OF SESSION:  PT End of Session - 07/04/22 1344     Visit Number 15    Number of Visits 25    Date for PT Re-Evaluation 07/21/22    Authorization Type BCBS    Authorization Time Period FOTO v6, v10    PT Start Time 1345    PT Stop Time 1425    PT Time Calculation (min) 40 min    Activity Tolerance Patient tolerated treatment well    Behavior During Therapy [facility] for tasks assessed/performed                     Past Medical History:  Diagnosis Date   Acute recurrent pansinusitis 10/21/2019   ADHD (attention deficit hyperactivity disorder)    inattentive   Anemia    had iron infusions in Fall of 2023   Anxiety    Asthma    Bronchitis    Cat allergy due to both airborne and skin contact 05/01/2016   Chronic sore throat 05/01/2016   COVID 2020   Depression    Fibromyalgia    pt denies   Gastroparesis    GERD (gastroesophageal reflux disease)    History of hypotension    taken midodrine in the past- Spring of 2022   Hypermobile Ehlers-Danlos syndrome    Irregular heart rate    Keratosis pilaris    Migraine    PONV (postoperative nausea and vomiting)    "I can get really cold"   PTSD (post-traumatic stress disorder)    Seasonal allergic rhinitis 03/11/2014   Past Surgical History:  Procedure Laterality Date   CHOLECYSTECTOMY  05/09/2021   ESOPHAGOGASTRODUODENOSCOPY ENDOSCOPY     03/2020   HIP ARTHROSCOPY Right 04/25/2022   Procedure: RIGHT HIP ARTHROSCOPY WITH LABRAL RECONSTRUCTION, ILIOTIBIAL BAND ALLOGRAFT, PSOAS RELEASE;  Surgeon: Vanetta Mulders, MD;  Location: [place];  Service: Orthopedics;  Laterality: Right;   LABRAL REPAIR Right 03/02/2021   Procedure: RIGHT HIP ARTHROSCOPY WITH LABRAL REPAIR AND PINCE DEBRIDEMENT;  Surgeon: Vanetta Mulders, MD;  Location: [place];  Service:  Orthopedics;  Laterality: Right;   SMART PILL PROCEDURE     12/2020   TYMPANOSTOMY TUBE PLACEMENT Bilateral    WISDOM TOOTH EXTRACTION     Patient Active Problem List   Diagnosis Date Noted   Cervical adenopathy 06/10/2021   Chronic pruritus 06/10/2021   Exercise-induced asthma 06/09/2021   Chronic venous insufficiency 06/03/2021   Orthostatic lightheadedness 06/03/2021   Purple toe syndrome of both feet ([place]) 06/03/2021   Autonomic dysfunction 06/03/2021   Tear of right acetabular labrum 04/13/2021   Migraine headache 03/01/2021   Headache 11/30/2020   Positive ANA (antinuclear antibody) 11/15/2020   Fibromyalgia 11/02/2020   Hair loss 11/02/2020   Muscle spasticity 10/22/2020   Hypermobile Ehlers-Danlos syndrome 10/22/2020   Paresthesia of bilateral legs 10/06/2020   Myalgia 10/06/2020   Chronic pelvic pain in female 10/06/2020   Chronic abdominal pain 10/06/2020   Irritable colon 08/02/2020   Pelvic floor dysfunction 08/02/2020   Irritant dermatitis 08/02/2020   Gastroesophageal reflux disease 06/28/2020   Severe recurrent major depression without psychotic features ([place]) 06/28/2020   Polyarthralgia 06/28/2020   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 06/28/2020   Tachycardia 06/28/2020   History of COVID-19 06/28/2020   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 deficit 06/28/2020   Recurrent major depressive disorder,  in full remission ([place]) 06/28/2020   Atypical chest pain 06/22/2020   Dizziness 06/22/2020   Palpitations 06/14/2020   Anxiety 05/04/2020   Internal and external hemorrhoids without complication [id number]   Nondiabetic gastroparesis 03/03/2020   ETD (Eustachian tube dysfunction), bilateral 10/21/2019   Acne vulgaris 03/11/2014   Mild intermittent asthma without complication 123XX123   Seasonal allergies 03/11/2014    REFERRING PROVIDER:  Vanetta Mulders, MD     REFERRING DIAG: [phone] (ICD-10-CM) - Tear of right  acetabular labrum, subsequent encounter  post op 04/25/22 R hip labral repair revision with IT band allograft   THERAPY DIAG:  Pain in right hip  Muscle weakness (generalized)  Difficulty in walking, not elsewhere classified  Rationale for Evaluation and Treatment: Rehabilitation  ONSET DATE: DOS 04/25/22  Days since surgery: 70   SUBJECTIVE:   SUBJECTIVE STATEMENT: Pt states that there is anterior pinching with posterolateral soreness. Pt states the lateral quad feels like an ache.   PERTINENT HISTORY: EDS PAIN:  Are you having pain? Yes: NPRS scale: 3/10 Pain location: rt hip Pain description: sore/achey Aggravating factors: movement Relieving factors: ice  PRECAUTIONS: Other: TDWB 2 weeks post op  WEIGHT BEARING RESTRICTIONS: no longer has   FALLS:  Has patient fallen in last 6 months? 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  PLOF: Independent  PATIENT GOALS: yoga, take dance classes  OBJECTIVE:   PATIENT SURVEYS:  FOTO EVAL: 27 2/2: 59  POSTURE:WFL on 1/19  PALPATION: End ranges [facility]  LOWER EXTREMITY ROM:  Passive ROM Right eval Right 05/23/22 Rt  06/21/22  Hip flexion 80 90 100 pinch   Hip extension 0    Hip abduction 30    Hip adduction     Hip internal rotation   25  Hip external rotation   45   (Blank rows = not tested) MMT(lb) Right 1/19 Left 1/19 Rt LE  06/21/22  Hip flexion 6.4 24.8   Hip extension   Can do single leg bridge 10 sec hold   Hip abduction 22.6 30.8 4+/5  Hip adduction     Hip internal rotation     Hip external rotation     Knee flexion 11.0 20.4   Knee extension 34.2 33.0    (Blank rows = not tested)   GAIT: EVAL: TDWB with bil axillary crutches 05/23/22: able to ambulate without AD, locked in neutral for hip extension 06/09/22: gait pattern without antalgia, good ROM and heel strike, no trendelenburg noted   TODAY'S TREATMENT:        [facility] Adult PT Treatment:                                                DATE:  07/04/22 Therapeutic Exercise: S/L bridge 3x8 S/L squat to high table 2x10 TRX kickstand squat 3x8 S/L RDL 10lbs 4x6 GTB sidesteps 54f x2 DL RDL 35lbs 3x8  Standing hip flexor stretch 30s 3x  Attempted bosu squat (too wobbly)  Bosu lunge hols 10s 4x  Manual Therapy: STM L quad (rec fem and VL)   S/L hip end range extension mob to 5 deg of ext    [facility]  [facility]: [facility] Therapeutic Exercise: 5/5 hip extension and glutes Reformer  Feet in straps 1 red 1 blue: arcs in parallel and turnout Circles in each direction  x 10 parallel  Squat (frog)  x 10 Single leg feet in straps 1 red arcs, single leg stretch  Footwork 2 red 1 blue 1 yellow  Heels with ball between knees Toes with ball between toes  Heel raise  Single leg press out ball under pelvis 2 red 1 blue Manual Therapy: IASTM to Rt post lateral hip and thigh   [facility] Adult PT Treatment:                                                DATE: 06/26/22 Therapeutic Exercise: Recumbent bike L2 , 5 min  Rt hip flexor off table Wide hip ER/IR x 10  Banded bridge 5 sec hold  x 10  Banded bridge march  x 10  SL on forearm clam x 15  SL plank x 30 sec  Q-ped hip extension 2 sets Rt LE  Q-ped clam and hydrant 2 sets blue band  Squats with band x 15  Lateral band walk 6 x 10 blue band  Step up 8 inch no wgt. X 10 each side  Repeat with 10 lbs Rt LE  x 10 Leg press 1.5 plates for LE strength double leg and then single leg 1 plate x 10   Manual Therapy: Posterior to anterior hip mobilizations Rt knee flexion edge of mat (PROM) PROM Rt hip in prone with compression to glutes, knee flexion , ER/IR not in end range   Treatment                            06/23/22:  Manual therapy to include passive hip flexion paired with external rotation, soft tissue massage to right piriformis in supine with hip movement, STM with hip IR ER to iliopsoas, prone hip extension with posterior  to anterior hip mobilizations, STM to piriformis with IR/ER of lower extremity, perpendicular stretching to ischio-tubercle ligament   [facility] Adult PT Treatment:                                                DATE: 06/21/22 Therapeutic Exercise: Sit to stand x 15 Squat x 15 for movement assessment  Single leg stance Rt LE 30 sec  Bridge x 10 double then x 10 Rt LE single  SLR  x 10, min strain on ant. Hip  Hip abduction  x 15  Reformer : Footwork 2 red 1 blue parallel and turnout on heels, toes used band around thighs for outer hip activation Single leg work 2 red 1 blue spring  15  Bridge on Reformer x 2 R 1 blue with band x 10  Manual Therapy: PROM Rt LE for assessment  Self Care: Pilates post PT, options   Treatment                            06/16/22:  STM Rt hip abdutors Seated figure 4 Stairs/glut activation STM to Rt VL proximal fibers Sidelying leg abd with knees flexed- tap knees/heels  SLR- neutral & ER       PATIENT EDUCATION:  Education details: Anatomy of condition, POC, HEP, exercise form/rationale Person educated: Patient and Parent Education method: Explanation, Demonstration, Tactile cues, Verbal cues, and Handouts Education comprehension: verbalized understanding, returned demonstration, verbal cues required, tactile cues required, and needs further education  HOME EXERCISE PROGRAM:    Access Code: [email or web address] URL: [email or web address] Date: 06/21/2022 Prepared by: Raeford Razor  Exercises - Sidelying Hip Abduction  - 3 x daily - 7 x weekly - 2 sets - 10 reps - Bridge on Heels  - 1 x daily - 7 x weekly - 2 sets - 10 reps - 3s hold - Marching Bridge  - 1 x daily - 7 x weekly - 5 sets - 5 reps - Wall Squat  - 1 x daily - 7 x weekly - 5 sets - 10 toe raises hold - Standing Gastroc Stretch  - 2 x daily - 7 x weekly - 2 sets - 30s hold - Seated Hamstring Stretch  - 2 x daily - 7 x weekly - 2 sets - 30s hold - Bent Knee Fallouts  - 2 x daily -  7 x weekly - 1 sets - 10 reps - Straight Leg Raise  - 1 x daily - 7 x weekly - 3 sets - 10 reps - Straight Leg Raise with External Rotation  - 1 x daily - 7 x weekly - 3 sets - 10 reps  ASSESSMENT:  CLINICAL IMPRESSION:  Pt presents with lack of hip extension and lateral thigh soft tissue restriction that was improved with manual. Pt able to reach past neutral hip extension following manual. Pt single LE exercise progressed today. Definite lack of R LE strength and endurance as expected but pt had good technique and form with verbal and tactile cuing. Pt does appear to have good motor control with increase degrees of freedom but is lacking functional capacity of the L LE. HEP updated today and pt advised on DOMS. Plan to continue strength as tolerated. Revisit Bosu squat as able when not fatigued. Pt advised to do self scraping to desensitize R lateral hip. Pt would benefit from continued skilled therapy in order to reach goals and maximize functional R LE strength and ROM for full return to PLOF.  OBJECTIVE IMPAIRMENTS: Abnormal gait, decreased activity tolerance, decreased balance, difficulty walking, decreased ROM, decreased strength, increased muscle spasms, impaired flexibility, improper body mechanics, and pain.   ACTIVITY LIMITATIONS: carrying, lifting, bending, sitting, standing, squatting, stairs, transfers, bed mobility, bathing, dressing, and locomotion level  PARTICIPATION LIMITATIONS: meal prep, cleaning, medication management, driving, shopping, community activity, and occupation  PERSONAL FACTORS: 3+ comorbidities: previous labral repair, EDS, autonomic dysfunction  are also affecting patient's functional outcome.     GOALS: Goals reviewed with patient? Yes  SHORT TERM GOALS: Target date: 05/26/22 30s STS without pain Baseline: x12 Goal status: achieved  2.  Able to demo household ambulation without AD and with proper pattern Baseline:  Goal status: achieved    LONG  TERM GOALS:   Able to demo step up on 6" step with level pelvis and proper form Baseline:  Goal status: 06/20/22  2.  Will tolerate at least 3 min on elliptical, demonstrating good tolerance to repetitive weight bearing motion Baseline:  Goal status: achieved Week 8 06/20/22  3.  Demonstrate proper form in at least 10 continuous lunges without increased pain Baseline:  Goal status: INITIAL Week 12 07/18/22  4.  Demonstrate gentle, double and  single foot plyometric motions with good proximal form Baseline:  Goal status: INITIAL Week 12 07/18/22  5.  Pt will begin gradual return to group fitness with understanding of modifications and precautions Baseline:  Goal status: INITIAL 05/18/23    PLAN:  PT FREQUENCY: 1-2x/week  PT DURATION: 12 weeks  PLANNED INTERVENTIONS: Therapeutic exercises, Therapeutic activity, Neuromuscular re-education, Balance training, Gait training, Patient/Family education, Self Care, Joint mobilization, Stair training, Aquatic Therapy, Dry Needling, Electrical stimulation, Spinal mobilization, Cryotherapy, Moist heat, Taping, Traction, Ultrasound, Ionotophoresis 74m/ml Dexamethasone, and Manual therapy  PLAN FOR NEXT SESSION: Elliptical. Core stability hip abduction and extension strengthening, balance, gently progressing to plyometrics on unstable surfaces.  ADaleen BoPT, DPT 07/04/22 2:40 PM

## 2022-07-05 ENCOUNTER — Ambulatory Visit: Payer: BC Managed Care – PPO | Admitting: Physical Therapy

## 2022-07-05 ENCOUNTER — Encounter (HOSPITAL_BASED_OUTPATIENT_CLINIC_OR_DEPARTMENT_OTHER): Payer: BC Managed Care – PPO | Admitting: Physical Therapy

## 2022-07-06 ENCOUNTER — Ambulatory Visit (HOSPITAL_BASED_OUTPATIENT_CLINIC_OR_DEPARTMENT_OTHER): Payer: BC Managed Care – PPO | Admitting: Physical Therapy

## 2022-07-06 ENCOUNTER — Encounter (HOSPITAL_BASED_OUTPATIENT_CLINIC_OR_DEPARTMENT_OTHER): Payer: Self-pay | Admitting: Physical Therapy

## 2022-07-06 DIAGNOSIS — M25551 Pain in right hip: Secondary | ICD-10-CM | POA: Diagnosis not present

## 2022-07-06 DIAGNOSIS — R262 Difficulty in walking, not elsewhere classified: Secondary | ICD-10-CM

## 2022-07-06 DIAGNOSIS — M6281 Muscle weakness (generalized): Secondary | ICD-10-CM

## 2022-07-06 NOTE — Therapy (Signed)
OUTPATIENT PHYSICAL THERAPY LOWER EXTREMITY TREATMENT   Patient Name: Brianna [name] MRN: [id number] DOB:July 03, 1995, 27 y.o., female Today's Date: 07/06/2022  END OF SESSION:  PT End of Session - 07/06/22 1310     Visit Number 16    Number of Visits 25    Date for PT Re-Evaluation 07/21/22    Authorization Type BCBS    Authorization Time Period FOTO v6, v10    PT Start Time 1308    PT Stop Time 1340    PT Time Calculation (min) 32 min    Activity Tolerance Patient tolerated treatment well    Behavior During Therapy [facility] for tasks assessed/performed                      Past Medical History:  Diagnosis Date   Acute recurrent pansinusitis 10/21/2019   ADHD (attention deficit hyperactivity disorder)    inattentive   Anemia    had iron infusions in Fall of 2023   Anxiety    Asthma    Bronchitis    Cat allergy due to both airborne and skin contact 05/01/2016   Chronic sore throat 05/01/2016   COVID 2020   Depression    Fibromyalgia    pt denies   Gastroparesis    GERD (gastroesophageal reflux disease)    History of hypotension    taken midodrine in the past- Spring of 2022   Hypermobile Ehlers-Danlos syndrome    Irregular heart rate    Keratosis pilaris    Migraine    PONV (postoperative nausea and vomiting)    "I can get really cold"   PTSD (post-traumatic stress disorder)    Seasonal allergic rhinitis 03/11/2014   Past Surgical History:  Procedure Laterality Date   CHOLECYSTECTOMY  05/09/2021   ESOPHAGOGASTRODUODENOSCOPY ENDOSCOPY     03/2020   HIP ARTHROSCOPY Right 04/25/2022   Procedure: RIGHT HIP ARTHROSCOPY WITH LABRAL RECONSTRUCTION, ILIOTIBIAL BAND ALLOGRAFT, PSOAS RELEASE;  Surgeon: Vanetta Mulders, MD;  Location: [place];  Service: Orthopedics;  Laterality: Right;   LABRAL REPAIR Right 03/02/2021   Procedure: RIGHT HIP ARTHROSCOPY WITH LABRAL REPAIR AND PINCE DEBRIDEMENT;  Surgeon: Vanetta Mulders, MD;  Location: [place];  Service:  Orthopedics;  Laterality: Right;   SMART PILL PROCEDURE     12/2020   TYMPANOSTOMY TUBE PLACEMENT Bilateral    WISDOM TOOTH EXTRACTION     Patient Active Problem List   Diagnosis Date Noted   Cervical adenopathy 06/10/2021   Chronic pruritus 06/10/2021   Exercise-induced asthma 06/09/2021   Chronic venous insufficiency 06/03/2021   Orthostatic lightheadedness 06/03/2021   Purple toe syndrome of both feet ([place]) 06/03/2021   Autonomic dysfunction 06/03/2021   Tear of right acetabular labrum 04/13/2021   Migraine headache 03/01/2021   Headache 11/30/2020   Positive ANA (antinuclear antibody) 11/15/2020   Fibromyalgia 11/02/2020   Hair loss 11/02/2020   Muscle spasticity 10/22/2020   Hypermobile Ehlers-Danlos syndrome 10/22/2020   Paresthesia of bilateral legs 10/06/2020   Myalgia 10/06/2020   Chronic pelvic pain in female 10/06/2020   Chronic abdominal pain 10/06/2020   Irritable colon 08/02/2020   Pelvic floor dysfunction 08/02/2020   Irritant dermatitis 08/02/2020   Gastroesophageal reflux disease 06/28/2020   Severe recurrent major depression without psychotic features ([place]) 06/28/2020   Polyarthralgia 06/28/2020   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 06/28/2020   Tachycardia 06/28/2020   History of COVID-19 06/28/2020   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 deficit 06/28/2020   Recurrent major depressive  disorder, in full remission ([place]) 06/28/2020   Atypical chest pain 06/22/2020   Dizziness 06/22/2020   Palpitations 06/14/2020   Anxiety 05/04/2020   Internal and external hemorrhoids without complication 123XX123   Nondiabetic gastroparesis 03/03/2020   ETD (Eustachian tube dysfunction), bilateral 10/21/2019   Acne vulgaris 03/11/2014   Mild intermittent asthma without complication [id number]   Seasonal allergies 03/11/2014    REFERRING PROVIDER:  Vanetta Mulders, MD     REFERRING DIAG: [phone] (ICD-10-CM) - Tear of right  acetabular labrum, subsequent encounter  post op 04/25/22 R hip labral repair revision with IT band allograft   THERAPY DIAG:  Pain in right hip  Muscle weakness (generalized)  Difficulty in walking, not elsewhere classified  Rationale for Evaluation and Treatment: Rehabilitation  ONSET DATE: DOS 04/25/22  Days since surgery: 72   SUBJECTIVE:   SUBJECTIVE STATEMENT: The whole back of my leg is really sore.  Caused me to limp.  PERTINENT HISTORY: EDS PAIN:  Are you having pain? Yes: NPRS scale: 3/10 Pain location: rt hip Pain description: sore/achey Aggravating factors: movement Relieving factors: ice  PRECAUTIONS: Other: TDWB 2 weeks post op  WEIGHT BEARING RESTRICTIONS: no longer has   FALLS:  Has patient fallen in last 6 months? 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  PLOF: Independent  PATIENT GOALS: yoga, take dance classes  OBJECTIVE:   PATIENT SURVEYS:  FOTO EVAL: 27 2/2: 59  POSTURE:WFL on 1/19  PALPATION: End ranges [facility]  LOWER EXTREMITY ROM:  Passive ROM Right eval Right 05/23/22 Rt  06/21/22  Hip flexion 80 90 100 pinch   Hip extension 0    Hip abduction 30    Hip adduction     Hip internal rotation   25  Hip external rotation   45   (Blank rows = not tested) MMT(lb) Right 1/19 Left 1/19 Rt LE  06/21/22  Hip flexion 6.4 24.8   Hip extension   Can do single leg bridge 10 sec hold   Hip abduction 22.6 30.8 4+/5  Hip adduction     Hip internal rotation     Hip external rotation     Knee flexion 11.0 20.4   Knee extension 34.2 33.0    (Blank rows = not tested)   GAIT: EVAL: TDWB with bil axillary crutches 05/23/22: able to ambulate without AD, locked in neutral for hip extension 06/09/22: gait pattern without antalgia, good ROM and heel strike, no trendelenburg noted   TODAY'S TREATMENT:       Treatment                            07/06/22:  Post rotation Rt innom spring hold in supine Prone Rt upper SIJ PA spring hold Trigger Point  Dry Needling, Manual Therapy Treatment:  Initial or subsequent education regarding Trigger Point Dry Needling: Initial Did patient give consent to treatment with Trigger Point Dry Needling: Yes TPDN with skilled palpation and monitoring followed by STM to the following muscles: Rt piriformis  Supine hip flexor release & passive hamstring stretch Standing balance on bosu, lateral rocking, A/P rocking Seated on bosu- hard side- core balance, round back to c-spine Seated on bosu- soft side- core balance   [facility] Adult PT Treatment:  DATE: 07/04/22 Therapeutic Exercise: S/L bridge 3x8 S/L squat to high table 2x10 TRX kickstand squat 3x8 S/L RDL 10lbs 4x6 GTB sidesteps 6f x2 DL RDL 35lbs 3x8  Standing hip flexor stretch 30s 3x  Attempted bosu squat (too wobbly)  Bosu lunge hols 10s 4x  Manual Therapy: STM L quad (rec fem and VL)   S/L hip end range extension mob to 5 deg of ext    [facility]                                                [facility]: 06[facility]22 Therapeutic Exercise: 5/5 hip extension and glutes Reformer  Feet in straps 1 red 1 blue: arcs in parallel and turnout Circles in each direction  x 10 parallel  Squat (frog)  x 10 Single leg feet in straps 1 red arcs, single leg stretch  Footwork 2 red 1 blue 1 yellow  Heels with ball between knees Toes with ball between toes  Heel raise  Single leg press out ball under pelvis 2 red 1 blue Manual Therapy: IASTM to Rt post lateral hip and thigh   PATIENT EDUCATION:  Education details: Anatomy of condition, POC, HEP, exercise form/rationale Person educated: Patient and Parent Education method: Explanation, Demonstration, Tactile cues, Verbal cues, and Handouts Education comprehension: verbalized understanding, returned demonstration, verbal cues required, tactile cues required, and needs further education  HOME EXERCISE PROGRAM:  Access Code: [email or web address]:  [email or web address]   ASSESSMENT:  CLINICAL IMPRESSION:  Soreness resulted in tightness and anterior rotation right innominate.  Corrected today and utilize dry needling to reduce spasm in hip abductors on the right side.  Able to reduce antalgic gait and advised patient to use heat later today as she should expect to be sore.  Will return to higher level activities as soreness should be decreased at next visit.  Minimal tolerance to seated core due to back pain.  OBJECTIVE IMPAIRMENTS: Abnormal gait, decreased activity tolerance, decreased balance, difficulty walking, decreased ROM, decreased strength, increased muscle spasms, impaired flexibility, improper body mechanics, and pain.   ACTIVITY LIMITATIONS: carrying, lifting, bending, sitting, standing, squatting, stairs, transfers, bed mobility, bathing, dressing, and locomotion level  PARTICIPATION LIMITATIONS: meal prep, cleaning, medication management, driving, shopping, community activity, and occupation  PERSONAL FACTORS: 3+ comorbidities: previous labral repair, EDS, autonomic dysfunction  are also affecting patient's functional outcome.     GOALS: Goals reviewed with patient? Yes  SHORT TERM GOALS: Target date: 05/26/22 30s STS without pain Baseline: x12 Goal status: achieved  2.  Able to demo household ambulation without AD and with proper pattern Baseline:  Goal status: achieved    LONG TERM GOALS:   Able to demo step up on 6" step with level pelvis and proper form Baseline:  Goal status: 06/20/22  2.  Will tolerate at least 3 min on elliptical, demonstrating good tolerance to repetitive weight bearing motion Baseline:  Goal status: achieved Week 8 06/20/22  3.  Demonstrate proper form in at least 10 continuous lunges without increased pain Baseline:  Goal status: INITIAL Week 12 07/18/22  4.  Demonstrate gentle, double and single foot plyometric motions with good proximal form Baseline:  Goal  status: INITIAL Week 12 07/18/22  5.  Pt will begin gradual return to group fitness with understanding of modifications and precautions Baseline:  Goal status: INITIAL 05/18/23  PLAN:  PT FREQUENCY: 1-2x/week  PT DURATION: 12 weeks  PLANNED INTERVENTIONS: Therapeutic exercises, Therapeutic activity, Neuromuscular re-education, Balance training, Gait training, Patient/Family education, Self Care, Joint mobilization, Stair training, Aquatic Therapy, Dry Needling, Electrical stimulation, Spinal mobilization, Cryotherapy, Moist heat, Taping, Traction, Ultrasound, Ionotophoresis 1m/ml Dexamethasone, and Manual therapy  PLAN FOR NEXT SESSION: Elliptical. Core stability hip abduction and extension strengthening, balance, gently progressing to plyometrics on unstable surfaces.  Harlea Goetzinger C. Preet Perrier PT, DPT 07/06/22 1:44 PM

## 2022-07-07 ENCOUNTER — Encounter (HOSPITAL_BASED_OUTPATIENT_CLINIC_OR_DEPARTMENT_OTHER): Payer: BC Managed Care – PPO | Admitting: Physical Therapy

## 2022-07-11 ENCOUNTER — Ambulatory Visit: Payer: BC Managed Care – PPO | Admitting: Physical Therapy

## 2022-07-11 ENCOUNTER — Ambulatory Visit (HOSPITAL_BASED_OUTPATIENT_CLINIC_OR_DEPARTMENT_OTHER): Payer: BC Managed Care – PPO | Admitting: Physical Therapy

## 2022-07-11 ENCOUNTER — Encounter (HOSPITAL_BASED_OUTPATIENT_CLINIC_OR_DEPARTMENT_OTHER): Payer: Self-pay | Admitting: Physical Therapy

## 2022-07-11 DIAGNOSIS — M25551 Pain in right hip: Secondary | ICD-10-CM | POA: Diagnosis not present

## 2022-07-11 DIAGNOSIS — R262 Difficulty in walking, not elsewhere classified: Secondary | ICD-10-CM

## 2022-07-11 DIAGNOSIS — M6281 Muscle weakness (generalized): Secondary | ICD-10-CM

## 2022-07-11 NOTE — Therapy (Signed)
OUTPATIENT PHYSICAL THERAPY LOWER EXTREMITY TREATMENT   Patient Name: Brianna [name] MRN: [id number] DOB:[birth year], 27 y.o., female Today's Date: 07/11/2022  END OF SESSION:  PT End of Session - 07/11/22 1304     Visit Number 17    Number of Visits 25    Date for PT Re-Evaluation 07/21/22    Authorization Type BCBS    Authorization Time Period FOTO v6, v10    PT Start Time 1303    PT Stop Time 1342    PT Time Calculation (min) 39 min    Activity Tolerance Patient tolerated treatment well    Behavior During Therapy [facility] for tasks assessed/performed                       Past Medical History:  Diagnosis Date   Acute recurrent pansinusitis 10/21/2019   ADHD (attention deficit hyperactivity disorder)    inattentive   Anemia    had iron infusions in Fall of 2023   Anxiety    Asthma    Bronchitis    Cat allergy due to both airborne and skin contact 05/01/2016   Chronic sore throat 05/01/2016   COVID 2020   Depression    Fibromyalgia    pt denies   Gastroparesis    GERD (gastroesophageal reflux disease)    History of hypotension    taken midodrine in the past- Spring of 2022   Hypermobile Ehlers-Danlos syndrome    Irregular heart rate    Keratosis pilaris    Migraine    PONV (postoperative nausea and vomiting)    "I can get really cold"   PTSD (post-traumatic stress disorder)    Seasonal allergic rhinitis 03/11/2014   Past Surgical History:  Procedure Laterality Date   CHOLECYSTECTOMY  05/09/2021   ESOPHAGOGASTRODUODENOSCOPY ENDOSCOPY     03/2020   HIP ARTHROSCOPY Right 04/25/2022   Procedure: RIGHT HIP ARTHROSCOPY WITH LABRAL RECONSTRUCTION, ILIOTIBIAL BAND ALLOGRAFT, PSOAS RELEASE;  Surgeon: Vanetta Mulders, MD;  Location: [place];  Service: Orthopedics;  Laterality: Right;   LABRAL REPAIR Right 03/02/2021   Procedure: RIGHT HIP ARTHROSCOPY WITH LABRAL REPAIR AND PINCE DEBRIDEMENT;  Surgeon: Vanetta Mulders, MD;  Location: [place];  Service:  Orthopedics;  Laterality: Right;   SMART PILL PROCEDURE     12/2020   TYMPANOSTOMY TUBE PLACEMENT Bilateral    WISDOM TOOTH EXTRACTION     Patient Active Problem List   Diagnosis Date Noted   Cervical adenopathy 06/10/2021   Chronic pruritus 06/10/2021   Exercise-induced asthma 06/09/2021   Chronic venous insufficiency 06/03/2021   Orthostatic lightheadedness 06/03/2021   Purple toe syndrome of both feet ([place]) 06/03/2021   Autonomic dysfunction 06/03/2021   Tear of right acetabular labrum 04/13/2021   Migraine headache 03/01/2021   Headache 11/30/2020   Positive ANA (antinuclear antibody) 11/15/2020   Fibromyalgia 11/02/2020   Hair loss 11/02/2020   Muscle spasticity 10/22/2020   Hypermobile Ehlers-Danlos syndrome 10/22/2020   Paresthesia of bilateral legs 10/06/2020   Myalgia 10/06/2020   Chronic pelvic pain in female 10/06/2020   Chronic abdominal pain 10/06/2020   Irritable colon 08/02/2020   Pelvic floor dysfunction 08/02/2020   Irritant dermatitis 08/02/2020   Gastroesophageal reflux disease 06/28/2020   Severe recurrent major depression without psychotic features ([place]) 06/28/2020   Polyarthralgia 06/28/2020   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 06/28/2020   Tachycardia 06/28/2020   History of COVID-19 06/28/2020   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 deficit 06/28/2020   Recurrent major  depressive disorder, in full remission ([place]) 06/28/2020   Atypical chest pain 06/22/2020   Dizziness 06/22/2020   Palpitations 06/14/2020   Anxiety 05/04/2020   Internal and external hemorrhoids without complication [id number]   Nondiabetic gastroparesis 03/03/2020   ETD (Eustachian tube dysfunction), bilateral 10/21/2019   Acne vulgaris 03/11/2014   Mild intermittent asthma without complication 123XX123   Seasonal allergies 03/11/2014    REFERRING PROVIDER:  Vanetta Mulders, MD     REFERRING DIAG: [phone] (ICD-10-CM) - Tear of right  acetabular labrum, subsequent encounter  post op 04/25/22 R hip labral repair revision with IT band allograft   THERAPY DIAG:  Pain in right hip  Muscle weakness (generalized)  Difficulty in walking, not elsewhere classified  Rationale for Evaluation and Treatment: Rehabilitation  ONSET DATE: DOS 04/25/22  Days since surgery: 77   SUBJECTIVE:   SUBJECTIVE STATEMENT: Pt states the lateral hip and thigh are just sore. Felt more   PERTINENT HISTORY: EDS PAIN:  Are you having pain? Yes: NPRS scale: 3/10 Pain location: rt hip Pain description: sore/achey Aggravating factors: movement Relieving factors: ice  PRECAUTIONS: Other: TDWB 2 weeks post op  WEIGHT BEARING RESTRICTIONS: no longer has   FALLS:  Has patient fallen in last 6 months? 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  PLOF: Independent  PATIENT GOALS: yoga, take dance classes  OBJECTIVE:   PATIENT SURVEYS:  FOTO EVAL: 27 2/2: 59  POSTURE:WFL on 1/19  PALPATION: End ranges [facility]  LOWER EXTREMITY ROM:  Passive ROM Right eval Right 05/23/22 Rt  06/21/22  Hip flexion 80 90 100 pinch   Hip extension 0    Hip abduction 30    Hip adduction     Hip internal rotation   25  Hip external rotation   45   (Blank rows = not tested) MMT(lb) Right 1/19 Left 1/19 Rt LE  06/21/22  Hip flexion 6.4 24.8   Hip extension   Can do single leg bridge 10 sec hold   Hip abduction 22.6 30.8 4+/5  Hip adduction     Hip internal rotation     Hip external rotation     Knee flexion 11.0 20.4   Knee extension 34.2 33.0    (Blank rows = not tested)   GAIT: EVAL: TDWB with bil axillary crutches 05/23/22: able to ambulate without AD, locked in neutral for hip extension 06/09/22: gait pattern without antalgia, good ROM and heel strike, no trendelenburg noted   TODAY'S TREATMENT:       2/20 Therapeutic Exercise: S/L bridge 2x10 S/L squat to high table 2x10 Side plank 3s 3x8 Lateral runner's step up 4x6 12" box runner's  step up 4x6 S/L RDL 10lbs 3x8 Bosu squat 3x10 SLS on Bosu 30s 4x     Manual Therapy: R lateral and inf Mulligan hip mobs grade III  Treatment                            07/06/22:  Post rotation Rt innom spring hold in supine Prone Rt upper SIJ PA spring hold Trigger Point Dry Needling, Manual Therapy Treatment:  Initial or subsequent education regarding Trigger Point Dry Needling: Initial Did patient give consent to treatment with Trigger Point Dry Needling: Yes TPDN with skilled palpation and monitoring followed by STM to the following muscles: Rt piriformis  Supine hip flexor release & passive hamstring stretch Standing balance on bosu, lateral rocking, A/P rocking Seated on bosu- hard side- core  balance, round back to c-spine Seated on bosu- soft side- core balance   [facility] Adult PT Treatment:                                                DATE: 07/04/22 Therapeutic Exercise: S/L bridge 3x8 S/L squat to high table 2x10 TRX kickstand squat 3x8 S/L RDL 10lbs 4x6 GTB sidesteps 28f x2 DL RDL 35lbs 3x8  Standing hip flexor stretch 30s 3x  Attempted bosu squat (too wobbly)  Bosu lunge hols 10s 4x  Manual Therapy: STM L quad (rec fem and VL)   S/L hip end range extension mob to 5 deg of ext    [facility]                                                DATE: 06/27/22 Therapeutic Exercise: 5/5 hip extension and glutes Reformer  Feet in straps 1 red 1 blue: arcs in parallel and turnout Circles in each direction  x 10 parallel  Squat (frog)  x 10 Single leg feet in straps 1 red arcs, single leg stretch  Footwork 2 red 1 blue 1 yellow  Heels with ball between knees Toes with ball between toes  Heel raise  Single leg press out ball under pelvis 2 red 1 blue Manual Therapy: IASTM to Rt post lateral hip and thigh   PATIENT EDUCATION:  Education details: Anatomy of condition, POC, HEP, exercise form/rationale Person educated: Patient and Parent Education method:  Explanation, Demonstration, Tactile cues, Verbal cues, and Handouts Education comprehension: verbalized understanding, returned demonstration, verbal cues required, tactile cues required, and needs further education  HOME EXERCISE PROGRAM:  Access Code: [email or web address]: [email or web address]   ASSESSMENT:  CLINICAL IMPRESSION: Pt able to return to previous exercise today with report of minimal soreness into R hip. Pt was able to progress step up exercise and continue with progression of R LE motor control today. Pt does have expected balance and stability deficits about the R at this time but had good repeat of previous CKC exercise. Plan to continue with strength and control progression as tolerated. Pt is 11 wks at this time. Consider plyometrics at next. Pt would benefit from continued skilled therapy in order to reach goals and maximize functional R LE strength and ROM for full return to PLOF.   OBJECTIVE IMPAIRMENTS: Abnormal gait, decreased activity tolerance, decreased balance, difficulty walking, decreased ROM, decreased strength, increased muscle spasms, impaired flexibility, improper body mechanics, and pain.   ACTIVITY LIMITATIONS: carrying, lifting, bending, sitting, standing, squatting, stairs, transfers, bed mobility, bathing, dressing, and locomotion level  PARTICIPATION LIMITATIONS: meal prep, cleaning, medication management, driving, shopping, community activity, and occupation  PERSONAL FACTORS: 3+ comorbidities: previous labral repair, EDS, autonomic dysfunction  are also affecting patient's functional outcome.     GOALS: Goals reviewed with patient? Yes  SHORT TERM GOALS: Target date: 05/26/22 30s STS without pain Baseline: x12 Goal status: achieved  2.  Able to demo household ambulation without AD and with proper pattern Baseline:  Goal status: achieved    LONG TERM GOALS:   Able to demo step up on 6" step with level pelvis and proper  form Baseline:  Goal status: 06/20/22  2.  Will tolerate at least 3 min on elliptical, demonstrating good tolerance to repetitive weight bearing motion Baseline:  Goal status: achieved Week 8 06/20/22  3.  Demonstrate proper form in at least 10 continuous lunges without increased pain Baseline:  Goal status: INITIAL Week 12 07/18/22  4.  Demonstrate gentle, double and single foot plyometric motions with good proximal form Baseline:  Goal status: INITIAL Week 12 07/18/22  5.  Pt will begin gradual return to group fitness with understanding of modifications and precautions Baseline:  Goal status: INITIAL 05/18/23    PLAN:  PT FREQUENCY: 1-2x/week  PT DURATION: 12 weeks  PLANNED INTERVENTIONS: Therapeutic exercises, Therapeutic activity, Neuromuscular re-education, Balance training, Gait training, Patient/Family education, Self Care, Joint mobilization, Stair training, Aquatic Therapy, Dry Needling, Electrical stimulation, Spinal mobilization, Cryotherapy, Moist heat, Taping, Traction, Ultrasound, Ionotophoresis 100m/ml Dexamethasone, and Manual therapy  PLAN FOR NEXT SESSION: Elliptical. Core stability hip abduction and extension strengthening, balance, start plyos  ADaleen BoPT, DPT 07/11/22 1:44 PM

## 2022-07-12 ENCOUNTER — Ambulatory Visit: Payer: BC Managed Care – PPO | Admitting: Physical Therapy

## 2022-07-13 ENCOUNTER — Ambulatory Visit (HOSPITAL_BASED_OUTPATIENT_CLINIC_OR_DEPARTMENT_OTHER): Payer: BC Managed Care – PPO | Admitting: Physical Therapy

## 2022-07-13 ENCOUNTER — Encounter (HOSPITAL_BASED_OUTPATIENT_CLINIC_OR_DEPARTMENT_OTHER): Payer: Self-pay | Admitting: Physical Therapy

## 2022-07-13 DIAGNOSIS — M6281 Muscle weakness (generalized): Secondary | ICD-10-CM

## 2022-07-13 DIAGNOSIS — R262 Difficulty in walking, not elsewhere classified: Secondary | ICD-10-CM

## 2022-07-13 DIAGNOSIS — M25551 Pain in right hip: Secondary | ICD-10-CM

## 2022-07-13 NOTE — Therapy (Signed)
[facility]   OUTPATIENT PHYSICAL THERAPY LOWER EXTREMITY TREATMENT   Patient Name: Brianna [name] MRN: [id number] DOB:Sep 10, 1995, 27 y.o., female Today's Date: 07/13/2022  END OF SESSION:  PT End of Session - 07/13/22 1223     Visit Number 18    Number of Visits 25    Date for PT Re-Evaluation 07/21/22    Authorization Type BCBS    Authorization Time Period FOTO v6, v10    PT Start Time 1220    PT Stop Time 1300    PT Time Calculation (min) 40 min    Activity Tolerance Patient tolerated treatment well    Behavior During Therapy [facility] for tasks assessed/performed                        Past Medical History:  Diagnosis Date   Acute recurrent pansinusitis 10/21/2019   ADHD (attention deficit hyperactivity disorder)    inattentive   Anemia    had iron infusions in Fall of 2023   Anxiety    Asthma    Bronchitis    Cat allergy due to both airborne and skin contact 05/01/2016   Chronic sore throat 05/01/2016   COVID 2020   Depression    Fibromyalgia    pt denies   Gastroparesis    GERD (gastroesophageal reflux disease)    History of hypotension    taken midodrine in the past- Spring of 2022   Hypermobile Ehlers-Danlos syndrome    Irregular heart rate    Keratosis pilaris    Migraine    PONV (postoperative nausea and vomiting)    "I can get really cold"   PTSD (post-traumatic stress disorder)    Seasonal allergic rhinitis 03/11/2014   Past Surgical History:  Procedure Laterality Date   CHOLECYSTECTOMY  05/09/2021   ESOPHAGOGASTRODUODENOSCOPY ENDOSCOPY     03/2020   HIP ARTHROSCOPY Right 04/25/2022   Procedure: RIGHT HIP ARTHROSCOPY WITH LABRAL RECONSTRUCTION, ILIOTIBIAL BAND ALLOGRAFT, PSOAS RELEASE;  Surgeon: Vanetta Mulders, MD;  Location: [place];  Service: Orthopedics;  Laterality: Right;   LABRAL REPAIR Right 03/02/2021   Procedure: RIGHT HIP ARTHROSCOPY WITH LABRAL REPAIR AND PINCE DEBRIDEMENT;  Surgeon: Vanetta Mulders, MD;  Location: [place];   Service: Orthopedics;  Laterality: Right;   SMART PILL PROCEDURE     12/2020   TYMPANOSTOMY TUBE PLACEMENT Bilateral    WISDOM TOOTH EXTRACTION     Patient Active Problem List   Diagnosis Date Noted   Cervical adenopathy 06/10/2021   Chronic pruritus 06/10/2021   Exercise-induced asthma 06/09/2021   Chronic venous insufficiency 06/03/2021   Orthostatic lightheadedness 06/03/2021   Purple toe syndrome of both feet ([place]) 06/03/2021   Autonomic dysfunction 06/03/2021   Tear of right acetabular labrum 04/13/2021   Migraine headache 03/01/2021   Headache 11/30/2020   Positive ANA (antinuclear antibody) 11/15/2020   Fibromyalgia 11/02/2020   Hair loss 11/02/2020   Muscle spasticity 10/22/2020   Hypermobile Ehlers-Danlos syndrome 10/22/2020   Paresthesia of bilateral legs 10/06/2020   Myalgia 10/06/2020   Chronic pelvic pain in female 10/06/2020   Chronic abdominal pain 10/06/2020   Irritable colon 08/02/2020   Pelvic floor dysfunction 08/02/2020   Irritant dermatitis 08/02/2020   Gastroesophageal reflux disease 06/28/2020   Severe recurrent major depression without psychotic features ([place]) 06/28/2020   Polyarthralgia 06/28/2020   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 06/28/2020   Tachycardia 06/28/2020   History of COVID-19 06/28/2020   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 deficit 06/28/2020  Recurrent major depressive disorder, in full remission ([place]) 06/28/2020   Atypical chest pain 06/22/2020   Dizziness 06/22/2020   Palpitations 06/14/2020   Anxiety 05/04/2020   Internal and external hemorrhoids without complication 123XX123   Nondiabetic gastroparesis 03/03/2020   ETD (Eustachian tube dysfunction), bilateral 10/21/2019   Acne vulgaris 03/11/2014   Mild intermittent asthma without complication 123XX123   Seasonal allergies 03/11/2014    REFERRING PROVIDER:  Vanetta Mulders, MD     REFERRING DIAG: [phone] (ICD-10-CM) - Tear of  right acetabular labrum, subsequent encounter  post op 04/25/22 R hip labral repair revision with IT band allograft   THERAPY DIAG:  Pain in right hip  Muscle weakness (generalized)  Difficulty in walking, not elsewhere classified  Rationale for Evaluation and Treatment: Rehabilitation  ONSET DATE: DOS 04/25/22  Days since surgery: 79   SUBJECTIVE:   SUBJECTIVE STATEMENT: Have not tried hiking yet. A little sharpness- rubbing TFL and proximal quads anterior-lateral.   PERTINENT HISTORY: EDS PAIN:  Are you having pain? Yes: NPRS scale: mild-mod/10 Pain location: rt hip Pain description: sore/achey Aggravating factors: movement Relieving factors: ice  PRECAUTIONS: Other: TDWB 2 weeks post op  WEIGHT BEARING RESTRICTIONS: no longer has   FALLS:  Has patient fallen in last 6 months? 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  PLOF: Independent  PATIENT GOALS: yoga, take dance classes  OBJECTIVE:   PATIENT SURVEYS:  FOTO EVAL: 27 2/2: 59  POSTURE:WFL on 1/19  PALPATION: End ranges WFL  LOWER EXTREMITY ROM:  Passive ROM Right eval Right 05/23/22 Rt  06/21/22  Hip flexion 80 90 100 pinch   Hip extension 0    Hip abduction 30    Hip adduction     Hip internal rotation   25  Hip external rotation   45   (Blank rows = not tested) MMT(lb) Right 1/19 Left 1/19 Rt LE  06/21/22  Hip flexion 6.4 24.8   Hip extension   Can do single leg bridge 10 sec hold   Hip abduction 22.6 30.8 4+/5  Hip adduction     Hip internal rotation     Hip external rotation     Knee flexion 11.0 20.4   Knee extension 34.2 33.0    (Blank rows = not tested)   GAIT: EVAL: TDWB with bil axillary crutches 05/23/22: able to ambulate without AD, locked in neutral for hip extension 06/09/22: gait pattern without antalgia, good ROM and heel strike, no trendelenburg noted   TODAY'S TREATMENT:       Treatment                            07/13/22:  Bosu hard side: squats, lateral weight shift,  A/P weight shift, fwd step up and lateral step ups to knee drive Bosu lunges onto ball side IASTM to 4 hip incisions Elliptical L1 4 min- cues for glut activation in hip ext   2/20 Therapeutic Exercise: S/L bridge 2x10 S/L squat to high table 2x10 Side plank 3s 3x8 Lateral runner's step up 4x6 12" box runner's step up 4x6 S/L RDL 10lbs 3x8 Bosu squat 3x10 SLS on Bosu 30s 4x     Manual Therapy: R lateral and inf Mulligan hip mobs grade III  Treatment                            07/06/22:  Post rotation Rt innom spring hold  in supine Prone Rt upper SIJ PA spring hold Trigger Point Dry Needling, Manual Therapy Treatment:  Initial or subsequent education regarding Trigger Point Dry Needling: Initial Did patient give consent to treatment with Trigger Point Dry Needling: Yes TPDN with skilled palpation and monitoring followed by STM to the following muscles: Rt piriformis  Supine hip flexor release & passive hamstring stretch Standing balance on bosu, lateral rocking, A/P rocking Seated on bosu- hard side- core balance, round back to c-spine Seated on bosu- soft side- core balance    PATIENT EDUCATION:  Education details: Anatomy of condition, POC, HEP, exercise form/rationale Person educated: Patient and Parent Education method: Explanation, Demonstration, Tactile cues, Verbal cues, and Handouts Education comprehension: verbalized understanding, returned demonstration, verbal cues required, tactile cues required, and needs further education  HOME EXERCISE PROGRAM:  Access Code: [email or web address] URL: [email or web address]   ASSESSMENT:  CLINICAL IMPRESSION: Scar tissue notable around incisions is tender and encouraged her to perform scar massage at home. Excellent tolerance to progression. Would like to work on yoga motions requiring large ROM. Has always been unable to do pigeon pose on Rt side.   OBJECTIVE IMPAIRMENTS: Abnormal gait, decreased activity tolerance,  decreased balance, difficulty walking, decreased ROM, decreased strength, increased muscle spasms, impaired flexibility, improper body mechanics, and pain.   ACTIVITY LIMITATIONS: carrying, lifting, bending, sitting, standing, squatting, stairs, transfers, bed mobility, bathing, dressing, and locomotion level  PARTICIPATION LIMITATIONS: meal prep, cleaning, medication management, driving, shopping, community activity, and occupation  PERSONAL FACTORS: 3+ comorbidities: previous labral repair, EDS, autonomic dysfunction  are also affecting patient's functional outcome.     GOALS: Goals reviewed with patient? Yes  SHORT TERM GOALS: Target date: 05/26/22 30s STS without pain Baseline: x12 Goal status: achieved  2.  Able to demo household ambulation without AD and with proper pattern Baseline:  Goal status: achieved    LONG TERM GOALS:   Able to demo step up on 6" step with level pelvis and proper form Baseline:  Goal status: 06/20/22  2.  Will tolerate at least 3 min on elliptical, demonstrating good tolerance to repetitive weight bearing motion Baseline:  Goal status: achieved Week 8 06/20/22  3.  Demonstrate proper form in at least 10 continuous lunges without increased pain Baseline:  Goal status: INITIAL Week 12 07/18/22  4.  Demonstrate gentle, double and single foot plyometric motions with good proximal form Baseline:  Goal status: INITIAL Week 12 07/18/22  5.  Pt will begin gradual return to group fitness with understanding of modifications and precautions Baseline:  Goal status: INITIAL 05/18/23    PLAN:  PT FREQUENCY: 1-2x/week  PT DURATION: 12 weeks  PLANNED INTERVENTIONS: Therapeutic exercises, Therapeutic activity, Neuromuscular re-education, Balance training, Gait training, Patient/Family education, Self Care, Joint mobilization, Stair training, Aquatic Therapy, Dry Needling, Electrical stimulation, Spinal mobilization, Cryotherapy, Moist heat, Taping,  Traction, Ultrasound, Ionotophoresis 63m/ml Dexamethasone, and Manual therapy  PLAN FOR NEXT SESSION: Elliptical. Core stability hip abduction and extension strengthening, balance, start plyos  Eugena Rhue C. Macil Crady PT, DPT 07/13/22 1:08 PM

## 2022-07-17 ENCOUNTER — Ambulatory Visit (HOSPITAL_BASED_OUTPATIENT_CLINIC_OR_DEPARTMENT_OTHER): Payer: BC Managed Care – PPO | Admitting: Physical Therapy

## 2022-07-17 ENCOUNTER — Encounter (HOSPITAL_BASED_OUTPATIENT_CLINIC_OR_DEPARTMENT_OTHER): Payer: Self-pay | Admitting: Physical Therapy

## 2022-07-17 DIAGNOSIS — M25551 Pain in right hip: Secondary | ICD-10-CM

## 2022-07-17 DIAGNOSIS — R262 Difficulty in walking, not elsewhere classified: Secondary | ICD-10-CM

## 2022-07-17 DIAGNOSIS — M6281 Muscle weakness (generalized): Secondary | ICD-10-CM

## 2022-07-17 NOTE — Therapy (Signed)
Marland Kitchen   OUTPATIENT PHYSICAL THERAPY LOWER EXTREMITY TREATMENT   Patient Name: Shauri Ellingham MRN: [id number] DOB:[birth year], 27 y.o., female Today's Date: 07/17/2022  END OF SESSION:  PT End of Session - 07/17/22 1315     Visit Number 19    Number of Visits 25    Date for PT Re-Evaluation 07/21/22    Authorization Type [employer]    PT Start Time 1308    PT Stop Time 1340   unbilled time for PT to go to upstairs supply closet   PT Time Calculation (min) 32 min    Activity Tolerance Patient tolerated treatment well    Behavior During Therapy [facility] for tasks assessed/performed                        Past Medical History:  Diagnosis Date   Acute recurrent pansinusitis 10/21/2019   ADHD (attention deficit hyperactivity disorder)    inattentive   Anemia    had iron infusions in Fall of 2023   Anxiety    Asthma    Bronchitis    Cat allergy due to both airborne and skin contact 05/01/2016   Chronic sore throat 05/01/2016   COVID 2020   Depression    Fibromyalgia    pt denies   Gastroparesis    GERD (gastroesophageal reflux disease)    History of hypotension    taken midodrine in the past- Spring of 2022   Hypermobile Ehlers-Danlos syndrome    Irregular heart rate    Keratosis pilaris    Migraine    PONV (postoperative nausea and vomiting)    "I can get really cold"   PTSD (post-traumatic stress disorder)    Seasonal allergic rhinitis 03/11/2014   Past Surgical History:  Procedure Laterality Date   CHOLECYSTECTOMY  05/09/2021   ESOPHAGOGASTRODUODENOSCOPY ENDOSCOPY     03/2020   HIP ARTHROSCOPY Right 04/25/2022   Procedure: RIGHT HIP ARTHROSCOPY WITH LABRAL RECONSTRUCTION, ILIOTIBIAL BAND ALLOGRAFT, PSOAS RELEASE;  Surgeon: Vanetta Mulders, MD;  Location: [facility];  Service: Orthopedics;  Laterality: Right;   LABRAL REPAIR Right 03/02/2021   Procedure: RIGHT HIP ARTHROSCOPY WITH LABRAL REPAIR AND PINCE DEBRIDEMENT;  Surgeon: Vanetta Mulders, MD;  Location: [facility];  Service: Orthopedics;  Laterality: Right;   SMART PILL PROCEDURE     12/2020   TYMPANOSTOMY TUBE PLACEMENT Bilateral    WISDOM TOOTH EXTRACTION     Patient Active Problem List   Diagnosis Date Noted   Cervical adenopathy 06/10/2021   Chronic pruritus 06/10/2021   Exercise-induced asthma 06/09/2021   Chronic venous insufficiency 06/03/2021   Orthostatic lightheadedness 06/03/2021   Purple toe syndrome of both feet ([place]) 06/03/2021   Autonomic dysfunction 06/03/2021   Tear of right acetabular labrum 04/13/2021   Migraine headache 03/01/2021   Headache 11/30/2020   Positive ANA (antinuclear antibody) 11/15/2020   Fibromyalgia 11/02/2020   Hair loss 11/02/2020   Muscle spasticity 10/22/2020   Hypermobile Ehlers-Danlos syndrome 10/22/2020   Paresthesia of bilateral legs 10/06/2020   Myalgia 10/06/2020   Chronic pelvic pain in female 10/06/2020   Chronic abdominal pain 10/06/2020   Irritable colon 08/02/2020   Pelvic floor dysfunction 08/02/2020   Irritant dermatitis 08/02/2020   Gastroesophageal reflux disease 06/28/2020   Severe recurrent major depression without psychotic features ([place]) 06/28/2020   Polyarthralgia 06/28/2020   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 06/28/2020   Tachycardia 06/28/2020   History of COVID-19 06/28/2020   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 deficit  06/28/2020   Recurrent major depressive disorder, in full remission ([place]) 06/28/2020   Atypical chest pain 06/22/2020   Dizziness 06/22/2020   Palpitations 06/14/2020   Anxiety 05/04/2020   Internal and external hemorrhoids without complication [id number]   Nondiabetic gastroparesis 03/03/2020   ETD (Eustachian tube dysfunction), bilateral 10/21/2019   Acne vulgaris 03/11/2014   Mild intermittent asthma without complication [id number]   Seasonal allergies 03/11/2014    REFERRING PROVIDER:  Vanetta Mulders, MD     REFERRING DIAG: [phone] (ICD-10-CM) -  Tear of right acetabular labrum, subsequent encounter  post op 04/25/22 R hip labral repair revision with IT band allograft   THERAPY DIAG:  Pain in right hip  Muscle weakness (generalized)  Difficulty in walking, not elsewhere classified  Rationale for Evaluation and Treatment: Rehabilitation  ONSET DATE: DOS 04/25/22  Days since surgery: 83   SUBJECTIVE:   SUBJECTIVE STATEMENT: Soreness in lateral hip with swelling- picture found in phone taken probably about 1.5 wk ago. Able to walk with ache. Rubs proximall attachment of quads when describing discomfort and edema noted distal to greater trochanter.   PERTINENT HISTORY: EDS PAIN:  Are you having pain? Yes: NPRS scale: mild-mod/10 Pain location: rt hip Pain description: sore/achey Aggravating factors: movement Relieving factors: ice  PRECAUTIONS: Other: TDWB 2 weeks post op  WEIGHT BEARING RESTRICTIONS: no longer has   FALLS:  Has patient fallen in last 6 months? 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  PLOF: Independent  PATIENT GOALS: yoga, take dance classes  OBJECTIVE:   PATIENT SURVEYS:  FOTO EVAL: 27 2/2: 59  POSTURE:WFL on 1/19  PALPATION: End ranges [facility]  LOWER EXTREMITY ROM:  Passive ROM Right eval Right 05/23/22 Rt  06/21/22  Hip flexion 80 90 100 pinch   Hip extension 0    Hip abduction 30    Hip adduction     Hip internal rotation   25  Hip external rotation   45   (Blank rows = not tested) MMT(lb) Right 1/19 Left 1/19 Rt LE  06/21/22  Hip flexion 6.4 24.8   Hip extension   Can do single leg bridge 10 sec hold   Hip abduction 22.6 30.8 4+/5  Hip adduction     Hip internal rotation     Hip external rotation     Knee flexion 11.0 20.4   Knee extension 34.2 33.0    (Blank rows = not tested)   GAIT: EVAL: TDWB with bil axillary crutches 05/23/22: able to ambulate without AD, locked in neutral for hip extension 06/09/22: gait pattern without antalgia, good ROM and heel strike, no  trendelenburg noted   TODAY'S TREATMENT:       Treatment                            2/26:  Testing: hip abd, ER, flexion create concordant pain but are all strong; no issues with hip extension or adduction.  Ace bandage for compression from 1/2 way up thigh and covered edema. Pt verbalized ability to re-wrap on her own and will let me know if she needs help.    Treatment                            07/13/22:  Bosu hard side: squats, lateral weight shift, A/P weight shift, fwd step up and lateral step ups to knee drive Bosu lunges onto ball side IASTM to  4 hip incisions Elliptical L1 4 min- cues for glut activation in hip ext   2/20 Therapeutic Exercise: S/L bridge 2x10 S/L squat to high table 2x10 Side plank 3s 3x8 Lateral runner's step up 4x6 12" box runner's step up 4x6 S/L RDL 10lbs 3x8 Bosu squat 3x10 SLS on Bosu 30s 4x     Manual Therapy: R lateral and inf Mulligan hip mobs grade III   PATIENT EDUCATION:  Education details: Geophysicist/field seismologist of condition, POC, HEP, exercise form/rationale Person educated: Patient and Parent Education method: Consulting civil engineer, Media planner, Corporate treasurer cues, Verbal cues, and Handouts Education comprehension: verbalized understanding, returned demonstration, verbal cues required, tactile cues required, and needs further education  HOME EXERCISE PROGRAM:  Access Code: [email or web address] URL: [email or web address]   ASSESSMENT:  CLINICAL IMPRESSION: Soft edema buildup on post, lateral hip just distal to greater trochanter. TTP, no redness or heat noted to touch. Significant increase in pain but is able to demonstrate good strength in all MMT. Will continue to use ace bandage wrap for compression until f/u with MD on Wed.   OBJECTIVE IMPAIRMENTS: Abnormal gait, decreased activity tolerance, decreased balance, difficulty walking, decreased ROM, decreased strength, increased muscle spasms, impaired flexibility, improper body mechanics, and pain.    ACTIVITY LIMITATIONS: carrying, lifting, bending, sitting, standing, squatting, stairs, transfers, bed mobility, bathing, dressing, and locomotion level  PARTICIPATION LIMITATIONS: meal prep, cleaning, medication management, driving, shopping, community activity, and occupation  PERSONAL FACTORS: 3+ comorbidities: previous labral repair, EDS, autonomic dysfunction  are also affecting patient's functional outcome.     GOALS: Goals reviewed with patient? Yes  SHORT TERM GOALS: Target date: 05/26/22 30s STS without pain Baseline: x12 Goal status: achieved  2.  Able to demo household ambulation without AD and with proper pattern Baseline:  Goal status: achieved    LONG TERM GOALS:   Able to demo step up on 6" step with level pelvis and proper form Baseline:  Goal status: 06/20/22  2.  Will tolerate at least 3 min on elliptical, demonstrating good tolerance to repetitive weight bearing motion Baseline:  Goal status: achieved Week 8 06/20/22  3.  Demonstrate proper form in at least 10 continuous lunges without increased pain Baseline:  Goal status: INITIAL Week 12 07/18/22  4.  Demonstrate gentle, double and single foot plyometric motions with good proximal form Baseline:  Goal status: INITIAL Week 12 07/18/22  5.  Pt will begin gradual return to group fitness with understanding of modifications and precautions Baseline:  Goal status: INITIAL 05/18/23    PLAN:  PT FREQUENCY: 1-2x/week  PT DURATION: 12 weeks  PLANNED INTERVENTIONS: Therapeutic exercises, Therapeutic activity, Neuromuscular re-education, Balance training, Gait training, Patient/Family education, Self Care, Joint mobilization, Stair training, Aquatic Therapy, Dry Needling, Electrical stimulation, Spinal mobilization, Cryotherapy, Moist heat, Taping, Traction, Ultrasound, Ionotophoresis '4mg'$ /ml Dexamethasone, and Manual therapy  PLAN FOR NEXT SESSION: Elliptical. Core stability hip abduction and extension  strengthening, balance, start plyos  Zaiya Annunziato C. Celestial Barnfield PT, DPT 07/17/22 1:45 PM

## 2022-07-18 ENCOUNTER — Encounter (HOSPITAL_BASED_OUTPATIENT_CLINIC_OR_DEPARTMENT_OTHER): Payer: Self-pay

## 2022-07-18 ENCOUNTER — Encounter (HOSPITAL_BASED_OUTPATIENT_CLINIC_OR_DEPARTMENT_OTHER): Payer: BC Managed Care – PPO | Admitting: Physical Therapy

## 2022-07-19 ENCOUNTER — Ambulatory Visit (INDEPENDENT_AMBULATORY_CARE_PROVIDER_SITE_OTHER): Payer: BC Managed Care – PPO | Admitting: Orthopaedic Surgery

## 2022-07-19 DIAGNOSIS — S73191D Other sprain of right hip, subsequent encounter: Secondary | ICD-10-CM

## 2022-07-19 MED ORDER — METHOCARBAMOL 500 MG PO TABS: 500.0000 mg | ORAL_TABLET | Freq: Four times a day (QID) | ORAL | 0 refills | Status: AC

## 2022-07-19 MED ORDER — MELOXICAM 15 MG PO TABS: 15.0000 mg | ORAL_TABLET | Freq: Every day | ORAL | 0 refills | Status: DC

## 2022-07-19 NOTE — Progress Notes (Signed)
Post Operative Evaluation    Procedure/Date of Surgery: Right hip arthroscopy with labral reconstruction 12/5  Interval History:   06/21/2022: Presents today 6 weeks status post the above procedure.  Overall she is continuing to make dramatic improvements.  She does experience some mild soreness although this is overall very mild.  The hip is feeling stable and solid.  She is benefiting from lateral abductor massage.  PMH/PSH/Family History/Social History/Meds/Allergies:    Past Medical History:  Diagnosis Date   Acute recurrent pansinusitis 10/21/2019   ADHD (attention deficit hyperactivity disorder)    inattentive   Anemia    had iron infusions in Fall of 2023   Anxiety    Asthma    Bronchitis    Cat allergy due to both airborne and skin contact 05/01/2016   Chronic sore throat 05/01/2016   COVID 2020   Depression    Fibromyalgia    pt denies   Gastroparesis    GERD (gastroesophageal reflux disease)    History of hypotension    taken midodrine in the past- Spring of 2022   Hypermobile Ehlers-Danlos syndrome    Irregular heart rate    Keratosis pilaris    Migraine    PONV (postoperative nausea and vomiting)    "I can get really cold"   PTSD (post-traumatic stress disorder)    Seasonal allergic rhinitis 03/11/2014   Past Surgical History:  Procedure Laterality Date   CHOLECYSTECTOMY  05/09/2021   ESOPHAGOGASTRODUODENOSCOPY ENDOSCOPY     03/2020   HIP ARTHROSCOPY Right 04/25/2022   Procedure: RIGHT HIP ARTHROSCOPY WITH LABRAL RECONSTRUCTION, ILIOTIBIAL BAND ALLOGRAFT, PSOAS RELEASE;  Surgeon: Vanetta Mulders, MD;  Location: [facility];  Service: Orthopedics;  Laterality: Right;   LABRAL REPAIR Right 03/02/2021   Procedure: RIGHT HIP ARTHROSCOPY WITH LABRAL REPAIR AND PINCE DEBRIDEMENT;  Surgeon: Vanetta Mulders, MD;  Location: [facility];  Service: Orthopedics;  Laterality: Right;   SMART PILL PROCEDURE     12/2020   TYMPANOSTOMY TUBE  PLACEMENT Bilateral    WISDOM TOOTH EXTRACTION     Social History   Socioeconomic History   Marital status: Single    Spouse name: Not on file   Number of children: Not on file   Years of education: Not on file   Highest education level: Not on file  Occupational History   Not on file  Tobacco Use   Smoking status: Former    Types: E-cigarettes, Cigarettes    Quit date: 2021    Years since quitting: 3.0   Smokeless tobacco: Never  Vaping Use   Vaping Use: Former   Substances: Editor, commissioning  Substance and Sexual Activity   Alcohol use: Not Currently   Drug use: Yes    Frequency: 7.0 times per week    Types: Marijuana   Sexual activity: Yes    Birth control/protection: None  Other Topics Concern   Not on file  Social History Narrative   Right handed   Social Determinants of Health   Financial Resource Strain: Not on file  Food Insecurity: Not on file  Transportation Needs: Not on file  Physical Activity: Not on file  Stress: Not on file  Social Connections: Not on file   Family History  Problem Relation Age of Onset   Diabetes Mother    Polycystic ovary syndrome Mother  GER disease Father    Heart disease Paternal Uncle    Allergies  Allergen Reactions   Clindamycin Hives, Swelling and Rash   Tretinoin Dermatitis, Itching, Photosensitivity, Swelling and Other (See Comments)   Current Outpatient Medications  Medication Sig Dispense Refill   aspirin EC 325 MG tablet TAKE 1 TABLET BY MOUTH EVERY DAY 90 tablet 1   clobetasol (TEMOVATE) 0.05 % external solution Apply 1 Application topically daily as needed (Irritation).     clonazePAM (KLONOPIN) 1 MG tablet Take 1 mg by mouth daily as needed for anxiety.     Dextromethorphan-buPROPion ER (AUVELITY) 45-105 MG TBCR Take 1 tablet by mouth 2 (two) times daily.     Drospirenone (SLYND) 4 MG TABS Take 1 tablet by mouth daily. 28 tablet 11   finasteride (PROPECIA) 1 MG tablet Take 1 mg by mouth daily.      Galcanezumab-gnlm (EMGALITY) 120 MG/ML SOAJ Inject 120 mg into the skin every 30 (thirty) days.     loratadine (CLARITIN) 10 MG tablet Take 10 mg by mouth daily.     naratriptan (AMERGE) 2.5 MG tablet Take 2.5 mg by mouth as needed for migraine.     Omega-3 Fatty Acids (FISH OIL PO) Take 1,400 Units by mouth daily. Mini     ondansetron (ZOFRAN ODT) 4 MG disintegrating tablet Take 1 tablet (4 mg total) by mouth every 8 (eight) hours as needed for nausea or vomiting. 20 tablet 5   Pediatric Multiple Vitamins (PEDIATRIC MULTIVITAMIN) chewable tablet Chew 1 tablet by mouth daily.     promethazine (PHENERGAN) 25 MG tablet Take 1 tablet (25 mg total) by mouth every 6 (six) hours as needed for nausea or vomiting. (Patient taking differently: Take 12.5 mg by mouth every 6 (six) hours as needed for nausea or vomiting.) 30 tablet 0   tazarotene (TAZORAC) 0.05 % cream Apply 1 Application topically every other day.     tranexamic acid (LYSTEDA) 650 MG TABS tablet Take 2 tablets (1,300 mg total) by mouth 3 (three) times daily. Take during menses for a maximum of five days 30 tablet 2   triamcinolone cream (KENALOG) 0.1 % Apply 1 Application topically daily as needed (Eczma).     No current facility-administered medications for this visit.   No results found.  Review of Systems:   A ROS was performed including pertinent positives and negatives as documented in the HPI.   Musculoskeletal Exam:    Right hip incisions are healed.  Range of motion is 30 degrees internal rotation and 35 degrees external rotation without significant pain.  Improved abduction strength neurosensory exam is intact  Imaging:    None  I personally reviewed and interpreted the radiographs.   Assessment:   27 year old female status post right hip arthroscopic labral reconstruction.  She is having some swelling about the TFL today.  On my ultrasound examination I do not see any fluid.  To this fact she has spasming around this  area.  I would recommend an antispasmodic for period of [redacted] weeks along with Mobic to help her swelling.  I do and would like her to continue physical therapy to work on local swelling modalities about this hip to hopefully decrease the swelling about the hip.  I will plan to see her back in 8 weeks for reassessment Plan :    -Return to clinic 8 weeks for reassessment    I personally saw and evaluated the patient, and participated in the management and treatment plan.  Remo Lipps  Sammuel Hines, MD Attending Physician, Orthopedic Surgery  This document was dictated using Dragon voice recognition software. A reasonable attempt at proof reading has been made to minimize errors.

## 2022-07-20 ENCOUNTER — Encounter (HOSPITAL_BASED_OUTPATIENT_CLINIC_OR_DEPARTMENT_OTHER): Payer: Self-pay | Admitting: Physical Therapy

## 2022-07-20 ENCOUNTER — Ambulatory Visit: Payer: BC Managed Care – PPO

## 2022-07-20 ENCOUNTER — Ambulatory Visit (HOSPITAL_BASED_OUTPATIENT_CLINIC_OR_DEPARTMENT_OTHER): Payer: BC Managed Care – PPO | Admitting: Physical Therapy

## 2022-07-20 DIAGNOSIS — M25551 Pain in right hip: Secondary | ICD-10-CM

## 2022-07-20 DIAGNOSIS — R262 Difficulty in walking, not elsewhere classified: Secondary | ICD-10-CM

## 2022-07-20 DIAGNOSIS — M6281 Muscle weakness (generalized): Secondary | ICD-10-CM

## 2022-07-20 NOTE — Therapy (Signed)
Marland Kitchen   OUTPATIENT PHYSICAL THERAPY LOWER EXTREMITY TREATMENT   Patient Name: Brianna [name] MRN: [id number] DOB:Dec 06, 1995, 27 y.o., female Today's Date: 07/20/2022  END OF SESSION:  PT End of Session - 07/20/22 1437     Visit Number 20    Number of Visits 36    Date for PT Re-Evaluation 09/15/22    Authorization Type BCBS    PT Start Time 1435    PT Stop Time [id number]    PT Time Calculation (min) 40 min    Activity Tolerance Patient tolerated treatment well    Behavior During Therapy [facility] for tasks assessed/performed                         Past Medical History:  Diagnosis Date   Acute recurrent pansinusitis 10/21/2019   ADHD (attention deficit hyperactivity disorder)    inattentive   Anemia    had iron infusions in Fall of 2023   Anxiety    Asthma    Bronchitis    Cat allergy due to both airborne and skin contact 05/01/2016   Chronic sore throat 05/01/2016   COVID 2020   Depression    Fibromyalgia    pt denies   Gastroparesis    GERD (gastroesophageal reflux disease)    History of hypotension    taken midodrine in the past- Spring of 2022   Hypermobile Ehlers-Danlos syndrome    Irregular heart rate    Keratosis pilaris    Migraine    PONV (postoperative nausea and vomiting)    "I can get really cold"   PTSD (post-traumatic stress disorder)    Seasonal allergic rhinitis 03/11/2014   Past Surgical History:  Procedure Laterality Date   CHOLECYSTECTOMY  05/09/2021   ESOPHAGOGASTRODUODENOSCOPY ENDOSCOPY     03/2020   HIP ARTHROSCOPY Right 04/25/2022   Procedure: RIGHT HIP ARTHROSCOPY WITH LABRAL RECONSTRUCTION, ILIOTIBIAL BAND ALLOGRAFT, PSOAS RELEASE;  Surgeon: Vanetta Mulders, MD;  Location: [place];  Service: Orthopedics;  Laterality: Right;   LABRAL REPAIR Right 03/02/2021   Procedure: RIGHT HIP ARTHROSCOPY WITH LABRAL REPAIR AND PINCE DEBRIDEMENT;  Surgeon: Vanetta Mulders, MD;  Location: [place];  Service: Orthopedics;  Laterality: Right;    SMART PILL PROCEDURE     12/2020   TYMPANOSTOMY TUBE PLACEMENT Bilateral    WISDOM TOOTH EXTRACTION     Patient Active Problem List   Diagnosis Date Noted   Cervical adenopathy 06/10/2021   Chronic pruritus 06/10/2021   Exercise-induced asthma 06/09/2021   Chronic venous insufficiency 06/03/2021   Orthostatic lightheadedness 06/03/2021   Purple toe syndrome of both feet ([place]) 06/03/2021   Autonomic dysfunction 06/03/2021   Tear of right acetabular labrum 04/13/2021   Migraine headache 03/01/2021   Headache 11/30/2020   Positive ANA (antinuclear antibody) 11/15/2020   Fibromyalgia 11/02/2020   Hair loss 11/02/2020   Muscle spasticity 10/22/2020   Hypermobile Ehlers-Danlos syndrome 10/22/2020   Paresthesia of bilateral legs 10/06/2020   Myalgia 10/06/2020   Chronic pelvic pain in female 10/06/2020   Chronic abdominal pain 10/06/2020   Irritable colon 08/02/2020   Pelvic floor dysfunction 08/02/2020   Irritant dermatitis 08/02/2020   Gastroesophageal reflux disease 06/28/2020   Severe recurrent major depression without psychotic features ([place]) 06/28/2020   Polyarthralgia 06/28/2020   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 06/28/2020   Tachycardia 06/28/2020   History of COVID-19 06/28/2020   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 deficit 06/28/2020   Recurrent major depressive disorder, in full remission (  [place]) 06/28/2020   Atypical chest pain 06/22/2020   Dizziness 06/22/2020   Palpitations 06/14/2020   Anxiety 05/04/2020   Internal and external hemorrhoids without complication [id number]   Nondiabetic gastroparesis 03/03/2020   ETD (Eustachian tube dysfunction), bilateral 10/21/2019   Acne vulgaris 03/11/2014   Mild intermittent asthma without complication 123XX123   Seasonal allergies 03/11/2014    REFERRING PROVIDER:  Vanetta Mulders, MD     REFERRING DIAG: [phone] (ICD-10-CM) - Tear of right acetabular labrum, subsequent  encounter  post op 04/25/22 R hip labral repair revision with IT band allograft   THERAPY DIAG:  Pain in right hip  Muscle weakness (generalized)  Difficulty in walking, not elsewhere classified  Rationale for Evaluation and Treatment: Rehabilitation  ONSET DATE: DOS 04/25/22  Days since surgery: 86   SUBJECTIVE:   SUBJECTIVE STATEMENT: Swelling area is better, pain is higher today and groin pain with walking.   PERTINENT HISTORY: EDS PAIN:  Are you having pain? Yes: NPRS scale: mild-mod/10 Pain location: rt hip Pain description: sore/achey Aggravating factors: movement Relieving factors: ice  PRECAUTIONS: Other: TDWB 2 weeks post op  WEIGHT BEARING RESTRICTIONS: no longer has   FALLS:  Has patient fallen in last 6 months? 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  PLOF: Independent  PATIENT GOALS: yoga, take dance classes  OBJECTIVE:   PATIENT SURVEYS:  FOTO EVAL: 27 2/2: 59 2/29: 57  POSTURE:WFL on 1/19  PALPATION: End ranges WFL  LOWER EXTREMITY ROM:  Passive ROM Right eval Right 05/23/22 Rt  06/21/22 Rt AROM 2/29  Hip flexion 80 90 100 pinch  126 slight ant pinch  Hip extension 0     Hip abduction 30   40  Hip adduction      Hip internal rotation   25   Hip external rotation   45    (Blank rows = not tested) MMT(lb) Right 1/19 Left 1/19 Rt LE  06/21/22 Rt  2/29  Hip flexion 6.4 24.8    Hip extension   Can do single leg bridge 10 sec hold    Hip abduction 22.6 30.8 4+/5 31.5  Hip adduction      Hip internal rotation      Hip external rotation      Knee flexion 11.0 20.4  18.2  Knee extension 34.2 33.0  33.0   (Blank rows = not tested)   GAIT: EVAL: TDWB with bil axillary crutches 05/23/22: able to ambulate without AD, locked in neutral for hip extension 06/09/22: gait pattern without antalgia, good ROM and heel strike, no trendelenburg noted   TODAY'S TREATMENT:       Treatment                            07/20/22:  Manual therapy to  right hip musculature, passive hip extension and quadriceps stretching   Treatment                            2/26:  Testing: hip abd, ER, flexion create concordant pain but are all strong; no issues with hip extension or adduction.  Ace bandage for compression from 1/2 way up thigh and covered edema. Pt verbalized ability to re-wrap on her own and will let me know if she needs help.    Treatment  07/13/22:  Bosu hard side: squats, lateral weight shift, A/P weight shift, fwd step up and lateral step ups to knee drive Bosu lunges onto ball side IASTM to 4 hip incisions Elliptical L1 4 min- cues for glut activation in hip ext    PATIENT EDUCATION:  Education details: Anatomy of condition, POC, HEP, exercise form/rationale Person educated: Patient and Parent Education method: Explanation, Demonstration, Tactile cues, Verbal cues, and Handouts Education comprehension: verbalized understanding, returned demonstration, verbal cues required, tactile cues required, and needs further education  HOME EXERCISE PROGRAM:  Access Code: [email or web address] URL: [email or web address]   ASSESSMENT:  CLINICAL IMPRESSION: Patient presents today with complaints of anterior hip discomfort with hip extension upon ambulation.  Joint mobility was within functional limits but was able to feel a stretch in the hip flexors with passive stretching.  Concordant pain reported in trigger points found in glutes piriformis and TFL today.  Patient verbalized improvement in pain levels when walking following treatment.  Will extend plan of care to continue strength and stabilization as well as pain management and progression to meet long-term functional goals.  OBJECTIVE IMPAIRMENTS: Abnormal gait, decreased activity tolerance, decreased balance, difficulty walking, decreased ROM, decreased strength, increased muscle spasms, impaired flexibility, improper body mechanics, and pain.    ACTIVITY LIMITATIONS: carrying, lifting, bending, sitting, standing, squatting, stairs, transfers, bed mobility, bathing, dressing, and locomotion level  PARTICIPATION LIMITATIONS: meal prep, cleaning, medication management, driving, shopping, community activity, and occupation  PERSONAL FACTORS: 3+ comorbidities: previous labral repair, EDS, autonomic dysfunction  are also affecting patient's functional outcome.     GOALS: Goals reviewed with patient? Yes  SHORT TERM GOALS: Target date: 05/26/22 30s STS without pain Baseline: x12 Goal status: achieved  2.  Able to demo household ambulation without AD and with proper pattern Baseline:  Goal status: achieved    LONG TERM GOALS: Target date: POC date  Able to demo step up on 6" step with level pelvis and proper form Baseline:  Goal status: 06/20/22  2.  Will tolerate at least 3 min on elliptical, demonstrating good tolerance to repetitive weight bearing motion Baseline:  Goal status: achieved Week 8 06/20/22  3.  Demonstrate proper form in at least 10 continuous lunges without increased pain Baseline:  Goal status: ongoing  4.  Demonstrate gentle, double and single foot plyometric motions with good proximal form Baseline:  Goal status: ongoing  5.  Pt will begin gradual return to group fitness with understanding of modifications and precautions Baseline:  Goal status: ongoing    PLAN:  PT FREQUENCY: 1-2x/week  PT DURATION: 12 weeks  PLANNED INTERVENTIONS: Therapeutic exercises, Therapeutic activity, Neuromuscular re-education, Balance training, Gait training, Patient/Family education, Self Care, Joint mobilization, Stair training, Aquatic Therapy, Dry Needling, Electrical stimulation, Spinal mobilization, Cryotherapy, Moist heat, Taping, Traction, Ultrasound, Ionotophoresis '4mg'$ /ml Dexamethasone, and Manual therapy  PLAN FOR NEXT SESSION: Elliptical. Core stability hip abduction and extension strengthening, balance,  start plyos  Brianna [name]. Delailah Spieth PT, DPT 07/20/22 4:54 PM

## 2022-07-22 ENCOUNTER — Ambulatory Visit: Payer: BC Managed Care – PPO

## 2022-07-29 ENCOUNTER — Other Ambulatory Visit (HOSPITAL_BASED_OUTPATIENT_CLINIC_OR_DEPARTMENT_OTHER): Payer: Self-pay | Admitting: Orthopaedic Surgery

## 2022-07-29 ENCOUNTER — Encounter (HOSPITAL_BASED_OUTPATIENT_CLINIC_OR_DEPARTMENT_OTHER): Payer: Self-pay | Admitting: Orthopaedic Surgery

## 2022-07-29 ENCOUNTER — Ambulatory Visit: Payer: BC Managed Care – PPO | Attending: Student in an Organized Health Care Education/Training Program

## 2022-07-29 DIAGNOSIS — M25551 Pain in right hip: Secondary | ICD-10-CM | POA: Diagnosis present

## 2022-07-29 DIAGNOSIS — R262 Difficulty in walking, not elsewhere classified: Secondary | ICD-10-CM | POA: Insufficient documentation

## 2022-07-29 DIAGNOSIS — M6281 Muscle weakness (generalized): Secondary | ICD-10-CM | POA: Insufficient documentation

## 2022-07-29 MED ORDER — CYCLOBENZAPRINE HCL 10 MG PO TABS: 10.0000 mg | ORAL_TABLET | Freq: Three times a day (TID) | ORAL | 0 refills | Status: DC | PRN

## 2022-07-29 NOTE — Therapy (Signed)
Marland Kitchen   OUTPATIENT PHYSICAL THERAPY LOWER EXTREMITY TREATMENT   Patient Name: Brianna [name] MRN: [id number] DOB:Sep 27, 1995, 27 y.o., female Today's Date: 07/29/2022  END OF SESSION:  PT End of Session - 07/29/22 0911     Visit Number 21    Number of Visits 36    Date for PT Re-Evaluation 09/15/22    Authorization Type BCBS    Authorization Time Period FOTO v6, v10    PT Start Time 0910    PT Stop Time 0948    PT Time Calculation (min) 38 min    Activity Tolerance Patient tolerated treatment well    Behavior During Therapy [facility] for tasks assessed/performed              Past Medical History:  Diagnosis Date   Acute recurrent pansinusitis 10/21/2019   ADHD (attention deficit hyperactivity disorder)    inattentive   Anemia    had iron infusions in Fall of 2023   Anxiety    Asthma    Bronchitis    Cat allergy due to both airborne and skin contact 05/01/2016   Chronic sore throat 05/01/2016   COVID 2020   Depression    Fibromyalgia    pt denies   Gastroparesis    GERD (gastroesophageal reflux disease)    History of hypotension    taken midodrine in the past- Spring of 2022   Hypermobile Ehlers-Danlos syndrome    Irregular heart rate    Keratosis pilaris    Migraine    PONV (postoperative nausea and vomiting)    "I can get really cold"   PTSD (post-traumatic stress disorder)    Seasonal allergic rhinitis 03/11/2014   Past Surgical History:  Procedure Laterality Date   CHOLECYSTECTOMY  05/09/2021   ESOPHAGOGASTRODUODENOSCOPY ENDOSCOPY     03/2020   HIP ARTHROSCOPY Right 04/25/2022   Procedure: RIGHT HIP ARTHROSCOPY WITH LABRAL RECONSTRUCTION, ILIOTIBIAL BAND ALLOGRAFT, PSOAS RELEASE;  Surgeon: Vanetta Mulders, MD;  Location: [place];  Service: Orthopedics;  Laterality: Right;   LABRAL REPAIR Right 03/02/2021   Procedure: RIGHT HIP ARTHROSCOPY WITH LABRAL REPAIR AND PINCE DEBRIDEMENT;  Surgeon: Vanetta Mulders, MD;  Location: [place];  Service: Orthopedics;   Laterality: Right;   SMART PILL PROCEDURE     12/2020   TYMPANOSTOMY TUBE PLACEMENT Bilateral    WISDOM TOOTH EXTRACTION     Patient Active Problem List   Diagnosis Date Noted   Cervical adenopathy 06/10/2021   Chronic pruritus 06/10/2021   Exercise-induced asthma 06/09/2021   Chronic venous insufficiency 06/03/2021   Orthostatic lightheadedness 06/03/2021   Purple toe syndrome of both feet ([place]) 06/03/2021   Autonomic dysfunction 06/03/2021   Tear of right acetabular labrum 04/13/2021   Migraine headache 03/01/2021   Headache 11/30/2020   Positive ANA (antinuclear antibody) 11/15/2020   Fibromyalgia 11/02/2020   Hair loss 11/02/2020   Muscle spasticity 10/22/2020   Hypermobile Ehlers-Danlos syndrome 10/22/2020   Paresthesia of bilateral legs 10/06/2020   Myalgia 10/06/2020   Chronic pelvic pain in female 10/06/2020   Chronic abdominal pain 10/06/2020   Irritable colon 08/02/2020   Pelvic floor dysfunction 08/02/2020   Irritant dermatitis 08/02/2020   Gastroesophageal reflux disease 06/28/2020   Severe recurrent major depression without psychotic features ([place]) 06/28/2020   Polyarthralgia 06/28/2020   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 06/28/2020   Tachycardia 06/28/2020   History of COVID-19 06/28/2020   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 deficit 06/28/2020   Recurrent major depressive disorder, in full remission ([place]) 06/28/2020  Atypical chest pain 06/22/2020   Dizziness 06/22/2020   Palpitations 06/14/2020   Anxiety 05/04/2020   Internal and external hemorrhoids without complication 123XX123   Nondiabetic gastroparesis 03/03/2020   ETD (Eustachian tube dysfunction), bilateral 10/21/2019   Acne vulgaris 03/11/2014   Mild intermittent asthma without complication 123XX123   Seasonal allergies 03/11/2014    REFERRING PROVIDER:  Vanetta Mulders, MD     REFERRING DIAG: [phone] (ICD-10-CM) - Tear of right acetabular  labrum, subsequent encounter  post op 04/25/22 R hip labral repair revision with IT band allograft   THERAPY DIAG:  Pain in right hip  Muscle weakness (generalized)  Difficulty in walking, not elsewhere classified  Rationale for Evaluation and Treatment: Rehabilitation  ONSET DATE: DOS 04/25/22  Days since surgery: 95  SUBJECTIVE:   SUBJECTIVE STATEMENT: Patient reports that she went back to work this week and that walking around has been the most challenging. She states that the pain is worse at the end of the day/at night. She reports no help from pain medications and that tightness and stiffness have been the worst parts for her. She states "I don't feel like the surgery helped."  PERTINENT HISTORY: EDS PAIN:  Are you having pain? Yes: NPRS scale: mild-mod/10 Pain location: rt hip Pain description: sore/achey Aggravating factors: movement Relieving factors: ice  PRECAUTIONS: Other: TDWB 2 weeks post op  WEIGHT BEARING RESTRICTIONS: no longer has   FALLS:  Has patient fallen in last 6 months? 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  PLOF: Independent  PATIENT GOALS: yoga, take dance classes  OBJECTIVE:   PATIENT SURVEYS:  FOTO EVAL: 27 2/2: 59 2/29: 57  POSTURE:WFL on 1/19  PALPATION: End ranges WFL  LOWER EXTREMITY ROM:  Passive ROM Right eval Right 05/23/22 Rt  06/21/22 Rt AROM 2/29  Hip flexion 80 90 100 pinch  126 slight ant pinch  Hip extension 0     Hip abduction 30   40  Hip adduction      Hip internal rotation   25   Hip external rotation   45    (Blank rows = not tested) MMT(lb) Right 1/19 Left 1/19 Rt LE  06/21/22 Rt  2/29  Hip flexion 6.4 24.8    Hip extension   Can do single leg bridge 10 sec hold    Hip abduction 22.6 30.8 4+/5 31.5  Hip adduction      Hip internal rotation      Hip external rotation      Knee flexion 11.0 20.4  18.2  Knee extension 34.2 33.0  33.0   (Blank rows = not tested)   GAIT: EVAL: TDWB with bil  axillary crutches 05/23/22: able to ambulate without AD, locked in neutral for hip extension 06/09/22: gait pattern without antalgia, good ROM and heel strike, no trendelenburg noted   TODAY'S TREATMENT:      [facility] Adult PT Treatment:                                                DATE: 07/29/22 Self Care: Discussion of sleep habits, diet, pain management, return to work habits Manual Therapy: IASTM to lateral and posterior Rt hip, patient in sidelying  MTPR TFL, glutes, lateral hip Passive hip extension and quadriceps stretching   Treatment  07/20/22:  Manual therapy to right hip musculature, passive hip extension and quadriceps stretching   Treatment                            2/26:  Testing: hip abd, ER, flexion create concordant pain but are all strong; no issues with hip extension or adduction.  Ace bandage for compression from 1/2 way up thigh and covered edema. Pt verbalized ability to re-wrap on her own and will let me know if she needs help.     PATIENT EDUCATION:  Education details: Geophysicist/field seismologist of condition, POC, HEP, exercise form/rationale Person educated: Patient and Parent Education method: Explanation, Demonstration, Tactile cues, Verbal cues, and Handouts Education comprehension: verbalized understanding, returned demonstration, verbal cues required, tactile cues required, and needs further education  HOME EXERCISE PROGRAM:  Access Code: [email or web address] URL: [email or web address]   ASSESSMENT:  CLINICAL IMPRESSION: Patient presents to PT reporting continued pain, stiffness, and tightness and states that she feels like the surgery has not helped much. Session today focused on manual techniques to decrease tension and improve therapeutic alliance with patient. She responds well to manual techniques, reporting decreased tension at end of session. Patient was able to tolerate session with no adverse effects. Patient continues to benefit from  skilled PT services and should be progressed as able to improve functional independence.    OBJECTIVE IMPAIRMENTS: Abnormal gait, decreased activity tolerance, decreased balance, difficulty walking, decreased ROM, decreased strength, increased muscle spasms, impaired flexibility, improper body mechanics, and pain.   ACTIVITY LIMITATIONS: carrying, lifting, bending, sitting, standing, squatting, stairs, transfers, bed mobility, bathing, dressing, and locomotion level  PARTICIPATION LIMITATIONS: meal prep, cleaning, medication management, driving, shopping, community activity, and occupation  PERSONAL FACTORS: 3+ comorbidities: previous labral repair, EDS, autonomic dysfunction  are also affecting patient's functional outcome.     GOALS: Goals reviewed with patient? Yes  SHORT TERM GOALS: Target date: 05/26/22 30s STS without pain Baseline: x12 Goal status: achieved  2.  Able to demo household ambulation without AD and with proper pattern Baseline:  Goal status: achieved    LONG TERM GOALS: Target date: POC date  Able to demo step up on 6" step with level pelvis and proper form Baseline:  Goal status: 06/20/22  2.  Will tolerate at least 3 min on elliptical, demonstrating good tolerance to repetitive weight bearing motion Baseline:  Goal status: achieved Week 8 06/20/22  3.  Demonstrate proper form in at least 10 continuous lunges without increased pain Baseline:  Goal status: ongoing  4.  Demonstrate gentle, double and single foot plyometric motions with good proximal form Baseline:  Goal status: ongoing  5.  Pt will begin gradual return to group fitness with understanding of modifications and precautions Baseline:  Goal status: ongoing    PLAN:  PT FREQUENCY: 1-2x/week  PT DURATION: 12 weeks  PLANNED INTERVENTIONS: Therapeutic exercises, Therapeutic activity, Neuromuscular re-education, Balance training, Gait training, Patient/Family education, Self Care, Joint  mobilization, Stair training, Aquatic Therapy, Dry Needling, Electrical stimulation, Spinal mobilization, Cryotherapy, Moist heat, Taping, Traction, Ultrasound, Ionotophoresis '4mg'$ /ml Dexamethasone, and Manual therapy  PLAN FOR NEXT SESSION: Elliptical. Core stability hip abduction and extension strengthening, balance, start plyos  Margarette Canada PTA 07/29/22 9:55 AM

## 2022-08-01 ENCOUNTER — Ambulatory Visit: Payer: BC Managed Care – PPO

## 2022-08-01 DIAGNOSIS — M6281 Muscle weakness (generalized): Secondary | ICD-10-CM

## 2022-08-01 DIAGNOSIS — M25551 Pain in right hip: Secondary | ICD-10-CM

## 2022-08-01 DIAGNOSIS — R262 Difficulty in walking, not elsewhere classified: Secondary | ICD-10-CM

## 2022-08-01 NOTE — Therapy (Signed)
Marland Kitchen   OUTPATIENT PHYSICAL THERAPY LOWER EXTREMITY TREATMENT   Patient Name: Brianna [name] MRN: [id number] DOB:[birth year], 27 y.o., female Today's Date: 08/02/2022  END OF SESSION:  PT End of Session - 08/01/22 1656     Visit Number 21    Number of Visits 36    Date for PT Re-Evaluation 09/15/22    Authorization Type BCBS    Authorization Time Period FOTO v6, v10    PT Start Time 1700    PT Stop Time 1715    PT Time Calculation (min) 15 min    Activity Tolerance Patient tolerated treatment well    Behavior During Therapy [facility] for tasks assessed/performed               Past Medical History:  Diagnosis Date   Acute recurrent pansinusitis 10/21/2019   ADHD (attention deficit hyperactivity disorder)    inattentive   Anemia    had iron infusions in Fall of 2023   Anxiety    Asthma    Bronchitis    Cat allergy due to both airborne and skin contact 05/01/2016   Chronic sore throat 05/01/2016   COVID 2020   Depression    Fibromyalgia    pt denies   Gastroparesis    GERD (gastroesophageal reflux disease)    History of hypotension    taken midodrine in the past- Spring of 2022   Hypermobile Ehlers-Danlos syndrome    Irregular heart rate    Keratosis pilaris    Migraine    PONV (postoperative nausea and vomiting)    "I can get really cold"   PTSD (post-traumatic stress disorder)    Seasonal allergic rhinitis 03/11/2014   Past Surgical History:  Procedure Laterality Date   CHOLECYSTECTOMY  05/09/2021   ESOPHAGOGASTRODUODENOSCOPY ENDOSCOPY     03/2020   HIP ARTHROSCOPY Right 04/25/2022   Procedure: RIGHT HIP ARTHROSCOPY WITH LABRAL RECONSTRUCTION, ILIOTIBIAL BAND ALLOGRAFT, PSOAS RELEASE;  Surgeon: Vanetta Mulders, MD;  Location: [place];  Service: Orthopedics;  Laterality: Right;   LABRAL REPAIR Right 03/02/2021   Procedure: RIGHT HIP ARTHROSCOPY WITH LABRAL REPAIR AND PINCE DEBRIDEMENT;  Surgeon: Vanetta Mulders, MD;  Location: [place];  Service: Orthopedics;   Laterality: Right;   SMART PILL PROCEDURE     12/2020   TYMPANOSTOMY TUBE PLACEMENT Bilateral    WISDOM TOOTH EXTRACTION     Patient Active Problem List   Diagnosis Date Noted   Cervical adenopathy 06/10/2021   Chronic pruritus 06/10/2021   Exercise-induced asthma 06/09/2021   Chronic venous insufficiency 06/03/2021   Orthostatic lightheadedness 06/03/2021   Purple toe syndrome of both feet ([place]) 06/03/2021   Autonomic dysfunction 06/03/2021   Tear of right acetabular labrum 04/13/2021   Migraine headache 03/01/2021   Headache 11/30/2020   Positive ANA (antinuclear antibody) 11/15/2020   Fibromyalgia 11/02/2020   Hair loss 11/02/2020   Muscle spasticity 10/22/2020   Hypermobile Ehlers-Danlos syndrome 10/22/2020   Paresthesia of bilateral legs 10/06/2020   Myalgia 10/06/2020   Chronic pelvic pain in female 10/06/2020   Chronic abdominal pain 10/06/2020   Irritable colon 08/02/2020   Pelvic floor dysfunction 08/02/2020   Irritant dermatitis 08/02/2020   Gastroesophageal reflux disease 06/28/2020   Severe recurrent major depression without psychotic features ([place]) 06/28/2020   Polyarthralgia 06/28/2020   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 06/28/2020   Tachycardia 06/28/2020   History of COVID-19 06/28/2020   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 deficit 06/28/2020   Recurrent major depressive disorder, in full remission ([place])  06/28/2020   Atypical chest pain 06/22/2020   Dizziness 06/22/2020   Palpitations 06/14/2020   Anxiety 05/04/2020   Internal and external hemorrhoids without complication 123XX123   Nondiabetic gastroparesis 03/03/2020   ETD (Eustachian tube dysfunction), bilateral 10/21/2019   Acne vulgaris 03/11/2014   Mild intermittent asthma without complication 123XX123   Seasonal allergies 03/11/2014    REFERRING PROVIDER:  Vanetta Mulders, MD     REFERRING DIAG: [phone] (ICD-10-CM) - Tear of right acetabular  labrum, subsequent encounter  post op 04/25/22 R hip labral repair revision with IT band allograft   THERAPY DIAG:  Pain in right hip  Muscle weakness (generalized)  Difficulty in walking, not elsewhere classified  Rationale for Evaluation and Treatment: Rehabilitation  ONSET DATE: DOS 04/25/22  Days since surgery: 98  SUBJECTIVE:   SUBJECTIVE STATEMENT: Patient reports pain and tightness in hip today, worked today, had pain at work.  PERTINENT HISTORY: EDS PAIN:  Are you having pain? Yes: NPRS scale: 6/10 Pain location: rt hip Pain description: sore/achey Aggravating factors: movement Relieving factors: ice  PRECAUTIONS: Other: TDWB 2 weeks post op  WEIGHT BEARING RESTRICTIONS: no longer has   FALLS:  Has patient fallen in last 6 months? 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  PLOF: Independent  PATIENT GOALS: yoga, take dance classes  OBJECTIVE:   PATIENT SURVEYS:  FOTO EVAL: 27 2/2: 59 2/29: 57  POSTURE:WFL on 1/19  PALPATION: End ranges WFL  LOWER EXTREMITY ROM:  Passive ROM Right eval Right 05/23/22 Rt  06/21/22 Rt AROM 2/29  Hip flexion 80 90 100 pinch  126 slight ant pinch  Hip extension 0     Hip abduction 30   40  Hip adduction      Hip internal rotation   25   Hip external rotation   45    (Blank rows = not tested) MMT(lb) Right 1/19 Left 1/19 Rt LE  06/21/22 Rt  2/29  Hip flexion 6.4 24.8    Hip extension   Can do single leg bridge 10 sec hold    Hip abduction 22.6 30.8 4+/5 31.5  Hip adduction      Hip internal rotation      Hip external rotation      Knee flexion 11.0 20.4  18.2  Knee extension 34.2 33.0  33.0   (Blank rows = not tested)   GAIT: EVAL: TDWB with bil axillary crutches 05/23/22: able to ambulate without AD, locked in neutral for hip extension 06/09/22: gait pattern without antalgia, good ROM and heel strike, no trendelenburg noted   TODAY'S TREATMENT:      [facility] Adult PT Treatment:                                                 DATE: 08/01/22 Arrived-no charge for today's visit  [facility] Adult PT Treatment:                                                DATE: 07/29/22 Self Care: Discussion of sleep habits, diet, pain management, return to work habits Manual Therapy: IASTM to lateral and posterior Rt hip, patient in sidelying  MTPR TFL, glutes, lateral hip Passive hip extension and quadriceps stretching  Treatment                            07/20/22:  Manual therapy to right hip musculature, passive hip extension and quadriceps stretching    PATIENT EDUCATION:  Education details: Anatomy of condition, POC, HEP, exercise form/rationale Person educated: Patient and Parent Education method: Explanation, Demonstration, Tactile cues, Verbal cues, and Handouts Education comprehension: verbalized understanding, returned demonstration, verbal cues required, tactile cues required, and needs further education  HOME EXERCISE PROGRAM:  Access Code: [email or web address] URL: [email or web address]   ASSESSMENT:  CLINICAL IMPRESSION: Patient presents to PT reporting increased pain from having worked all day today. She reports no therapeutic benefit from manual therapy received at last session. She states that her hip feels like it is spasming today and she does not wish to try exercises to avoid increasing her pain. Will reach out to treating PT. Arrived - no charge for today's visit.    OBJECTIVE IMPAIRMENTS: Abnormal gait, decreased activity tolerance, decreased balance, difficulty walking, decreased ROM, decreased strength, increased muscle spasms, impaired flexibility, improper body mechanics, and pain.   ACTIVITY LIMITATIONS: carrying, lifting, bending, sitting, standing, squatting, stairs, transfers, bed mobility, bathing, dressing, and locomotion level  PARTICIPATION LIMITATIONS: meal prep, cleaning, medication management, driving, shopping, community activity, and occupation  PERSONAL FACTORS: 3+  comorbidities: previous labral repair, EDS, autonomic dysfunction  are also affecting patient's functional outcome.     GOALS: Goals reviewed with patient? Yes  SHORT TERM GOALS: Target date: 05/26/22 30s STS without pain Baseline: x12 Goal status: achieved  2.  Able to demo household ambulation without AD and with proper pattern Baseline:  Goal status: achieved    LONG TERM GOALS: Target date: POC date  Able to demo step up on 6" step with level pelvis and proper form Baseline:  Goal status: 06/20/22  2.  Will tolerate at least 3 min on elliptical, demonstrating good tolerance to repetitive weight bearing motion Baseline:  Goal status: achieved Week 8 06/20/22  3.  Demonstrate proper form in at least 10 continuous lunges without increased pain Baseline:  Goal status: ongoing  4.  Demonstrate gentle, double and single foot plyometric motions with good proximal form Baseline:  Goal status: ongoing  5.  Pt will begin gradual return to group fitness with understanding of modifications and precautions Baseline:  Goal status: ongoing    PLAN:  PT FREQUENCY: 1-2x/week  PT DURATION: 12 weeks  PLANNED INTERVENTIONS: Therapeutic exercises, Therapeutic activity, Neuromuscular re-education, Balance training, Gait training, Patient/Family education, Self Care, Joint mobilization, Stair training, Aquatic Therapy, Dry Needling, Electrical stimulation, Spinal mobilization, Cryotherapy, Moist heat, Taping, Traction, Ultrasound, Ionotophoresis '4mg'$ /ml Dexamethasone, and Manual therapy  PLAN FOR NEXT SESSION: Elliptical. Core stability hip abduction and extension strengthening, balance, start plyos  Margarette Canada PTA 08/02/22 9:12 AM

## 2022-08-03 ENCOUNTER — Encounter (HOSPITAL_BASED_OUTPATIENT_CLINIC_OR_DEPARTMENT_OTHER): Payer: Self-pay | Admitting: Physical Therapy

## 2022-08-03 ENCOUNTER — Ambulatory Visit (HOSPITAL_BASED_OUTPATIENT_CLINIC_OR_DEPARTMENT_OTHER): Payer: BC Managed Care – PPO | Attending: Orthopaedic Surgery | Admitting: Physical Therapy

## 2022-08-03 DIAGNOSIS — M25551 Pain in right hip: Secondary | ICD-10-CM | POA: Insufficient documentation

## 2022-08-03 DIAGNOSIS — R262 Difficulty in walking, not elsewhere classified: Secondary | ICD-10-CM | POA: Diagnosis present

## 2022-08-03 DIAGNOSIS — M6281 Muscle weakness (generalized): Secondary | ICD-10-CM | POA: Insufficient documentation

## 2022-08-03 NOTE — Therapy (Signed)
Marland Kitchen   OUTPATIENT PHYSICAL THERAPY LOWER EXTREMITY TREATMENT   Patient Name: Brianna [name] MRN: [id number] DOB:[birth year], 27 y.o., female Today's Date: 08/04/2022  END OF SESSION:  PT End of Session - 08/03/22 1308     Visit Number 22    Number of Visits 36    Date for PT Re-Evaluation 09/15/22    Authorization Type BCBS    Authorization Time Period FOTO v6, v10    PT Start Time 1307    PT Stop Time 1347    PT Time Calculation (min) 40 min    Activity Tolerance Patient tolerated treatment well    Behavior During Therapy [facility] for tasks assessed/performed               Past Medical History:  Diagnosis Date   Acute recurrent pansinusitis 10/21/2019   ADHD (attention deficit hyperactivity disorder)    inattentive   Anemia    had iron infusions in Fall of 2023   Anxiety    Asthma    Bronchitis    Cat allergy due to both airborne and skin contact 05/01/2016   Chronic sore throat 05/01/2016   COVID 2020   Depression    Fibromyalgia    pt denies   Gastroparesis    GERD (gastroesophageal reflux disease)    History of hypotension    taken midodrine in the past- Spring of 2022   Hypermobile Ehlers-Danlos syndrome    Irregular heart rate    Keratosis pilaris    Migraine    PONV (postoperative nausea and vomiting)    "I can get really cold"   PTSD (post-traumatic stress disorder)    Seasonal allergic rhinitis 03/11/2014   Past Surgical History:  Procedure Laterality Date   CHOLECYSTECTOMY  05/09/2021   ESOPHAGOGASTRODUODENOSCOPY ENDOSCOPY     03/2020   HIP ARTHROSCOPY Right 04/25/2022   Procedure: RIGHT HIP ARTHROSCOPY WITH LABRAL RECONSTRUCTION, ILIOTIBIAL BAND ALLOGRAFT, PSOAS RELEASE;  Surgeon: Vanetta Mulders, MD;  Location: [place];  Service: Orthopedics;  Laterality: Right;   LABRAL REPAIR Right 03/02/2021   Procedure: RIGHT HIP ARTHROSCOPY WITH LABRAL REPAIR AND PINCE DEBRIDEMENT;  Surgeon: Vanetta Mulders, MD;  Location: [place];  Service: Orthopedics;   Laterality: Right;   SMART PILL PROCEDURE     12/2020   TYMPANOSTOMY TUBE PLACEMENT Bilateral    WISDOM TOOTH EXTRACTION     Patient Active Problem List   Diagnosis Date Noted   Cervical adenopathy 06/10/2021   Chronic pruritus 06/10/2021   Exercise-induced asthma 06/09/2021   Chronic venous insufficiency 06/03/2021   Orthostatic lightheadedness 06/03/2021   Purple toe syndrome of both feet ([place]) 06/03/2021   Autonomic dysfunction 06/03/2021   Tear of right acetabular labrum 04/13/2021   Migraine headache 03/01/2021   Headache 11/30/2020   Positive ANA (antinuclear antibody) 11/15/2020   Fibromyalgia 11/02/2020   Hair loss 11/02/2020   Muscle spasticity 10/22/2020   Hypermobile Ehlers-Danlos syndrome 10/22/2020   Paresthesia of bilateral legs 10/06/2020   Myalgia 10/06/2020   Chronic pelvic pain in female 10/06/2020   Chronic abdominal pain 10/06/2020   Irritable colon 08/02/2020   Pelvic floor dysfunction 08/02/2020   Irritant dermatitis 08/02/2020   Gastroesophageal reflux disease 06/28/2020   Severe recurrent major depression without psychotic features ([place]) 06/28/2020   Polyarthralgia 06/28/2020   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 06/28/2020   Tachycardia 06/28/2020   History of COVID-19 06/28/2020   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 deficit 06/28/2020   Recurrent major depressive disorder, in full remission ([place])  06/28/2020   Atypical chest pain 06/22/2020   Dizziness 06/22/2020   Palpitations 06/14/2020   Anxiety 05/04/2020   Internal and external hemorrhoids without complication 123XX123   Nondiabetic gastroparesis 03/03/2020   ETD (Eustachian tube dysfunction), bilateral 10/21/2019   Acne vulgaris 03/11/2014   Mild intermittent asthma without complication [id number]   Seasonal allergies 03/11/2014    REFERRING PROVIDER:  Vanetta Mulders, MD     REFERRING DIAG: [phone] (ICD-10-CM) - Tear of right acetabular  labrum, subsequent encounter  post op 04/25/22 R hip labral repair revision with IT band allograft   THERAPY DIAG:  Pain in right hip  Muscle weakness (generalized)  Difficulty in walking, not elsewhere classified  Rationale for Evaluation and Treatment: Rehabilitation  ONSET DATE: DOS 04/25/22  Days since surgery: 100  SUBJECTIVE:   SUBJECTIVE STATEMENT: Bilateral hip tightness. Popping/clicking in eccentric lowering from hip flexion, AROM ER- pops are painful. Left hip pops also but not as painful.   PERTINENT HISTORY: EDS PAIN:  Are you having pain? Yes: NPRS scale: 6-7/10 Pain location: rt hip Pain description: sore/achey, tight, clicks deep- more medial Aggravating factors: movement Relieving factors: ice  PRECAUTIONS:none  WEIGHT BEARING RESTRICTIONS: no longer has   FALLS:  Has patient fallen in last 6 months? 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  PLOF: Independent  PATIENT GOALS: yoga, take dance classes  OBJECTIVE:   PATIENT SURVEYS:  FOTO EVAL: 27 2/2: 59 2/29: 57  POSTURE:WFL on 1/19  PALPATION: 3/14- able to palpate ITB snapping over greater trochanter throughout hip flexion  LOWER EXTREMITY ROM:  Passive ROM Right eval Right 05/23/22 Rt  06/21/22 Rt AROM 2/29  Hip flexion 80 90 100 pinch  126 slight ant pinch  Hip extension 0     Hip abduction 30   40  Hip adduction      Hip internal rotation   25   Hip external rotation   45    (Blank rows = not tested) MMT(lb) Right 1/19 Left 1/19 Rt LE  06/21/22 Rt  2/29  Hip flexion 6.4 24.8    Hip extension   Can do single leg bridge 10 sec hold    Hip abduction 22.6 30.8 4+/5 31.5  Hip adduction      Hip internal rotation      Hip external rotation      Knee flexion 11.0 20.4  18.2  Knee extension 34.2 33.0  33.0   (Blank rows = not tested)   GAIT: EVAL: TDWB with bil axillary crutches 05/23/22: able to ambulate without AD, locked in neutral for hip extension 06/09/22: gait pattern  without antalgia, good ROM and heel strike, no trendelenburg noted   TODAY'S TREATMENT:       Treatment                            3/14:  IASTM Rt ITB, quads, HS, TFL, proximal hip flexor tendons Passive flexion in sidelying for quads & ITB/TFL- also added contract/relax to both IASTM proximal hip flexor tendons Skin rolling    PATIENT EDUCATION:  Education details: Anatomy of condition, POC, HEP, exercise form/rationale Person educated: Patient and Parent Education method: Explanation, Demonstration, Tactile cues, Verbal cues, and Handouts Education comprehension: verbalized understanding, returned demonstration, verbal cues required, tactile cues required, and needs further education  HOME EXERCISE PROGRAM:  Access Code: [email or web address] URL: [email or web address]   ASSESSMENT:  CLINICAL IMPRESSION: Entirety of appt spent reducing excessive pain and tightness  through hip soft tissue- able to reduce ITB snapping with hip flexion and taught her how to use her IASTM tool.    OBJECTIVE IMPAIRMENTS: Abnormal gait, decreased activity tolerance, decreased balance, difficulty walking, decreased ROM, decreased strength, increased muscle spasms, impaired flexibility, improper body mechanics, and pain.   ACTIVITY LIMITATIONS: carrying, lifting, bending, sitting, standing, squatting, stairs, transfers, bed mobility, bathing, dressing, and locomotion level  PARTICIPATION LIMITATIONS: meal prep, cleaning, medication management, driving, shopping, community activity, and occupation  PERSONAL FACTORS: 3+ comorbidities: previous labral repair, EDS, autonomic dysfunction  are also affecting patient's functional outcome.     GOALS: Goals reviewed with patient? Yes  SHORT TERM GOALS: Target date: 05/26/22 30s STS without pain Baseline: x12 Goal status: achieved  2.  Able to demo household ambulation without AD and with proper pattern Baseline:  Goal status: achieved    LONG  TERM GOALS: Target date: POC date  Able to demo step up on 6" step with level pelvis and proper form Baseline:  Goal status: 06/20/22  2.  Will tolerate at least 3 min on elliptical, demonstrating good tolerance to repetitive weight bearing motion Baseline:  Goal status: achieved Week 8 06/20/22  3.  Demonstrate proper form in at least 10 continuous lunges without increased pain Baseline:  Goal status: ongoing  4.  Demonstrate gentle, double and single foot plyometric motions with good proximal form Baseline:  Goal status: ongoing  5.  Pt will begin gradual return to group fitness with understanding of modifications and precautions Baseline:  Goal status: ongoing    PLAN:  PT FREQUENCY: 1-2x/week  PT DURATION: 12 weeks  PLANNED INTERVENTIONS: Therapeutic exercises, Therapeutic activity, Neuromuscular re-education, Balance training, Gait training, Patient/Family education, Self Care, Joint mobilization, Stair training, Aquatic Therapy, Dry Needling, Electrical stimulation, Spinal mobilization, Cryotherapy, Moist heat, Taping, Traction, Ultrasound, Ionotophoresis 4mg /ml Dexamethasone, and Manual therapy  PLAN FOR NEXT SESSION: Elliptical. Core stability hip abduction and extension strengthening, balance, start plyos  Britney Newstrom C. Rachana Malesky PT, DPT 08/04/22 8:42 AM

## 2022-08-04 NOTE — Therapy (Signed)
OUTPATIENT PHYSICAL THERAPY LOWER EXTREMITY TREATMENT   Patient Name: Brianna [name] MRN: [id number] DOB:02/11/1996, 27 y.o., female Today's Date: 08/05/2022  END OF SESSION:  PT End of Session - 08/05/22 0947     Visit Number 23    Number of Visits 36    Date for PT Re-Evaluation 09/15/22    Authorization Type BCBS    Authorization Time Period FOTO v6, v10    PT Start Time 0946    PT Stop Time 1026    PT Time Calculation (min) 40 min    Activity Tolerance Patient tolerated treatment well    Behavior During Therapy [facility] for tasks assessed/performed                Past Medical History:  Diagnosis Date   Acute recurrent pansinusitis 10/21/2019   ADHD (attention deficit hyperactivity disorder)    inattentive   Anemia    had iron infusions in Fall of 2023   Anxiety    Asthma    Bronchitis    Cat allergy due to both airborne and skin contact 05/01/2016   Chronic sore throat 05/01/2016   COVID 2020   Depression    Fibromyalgia    pt denies   Gastroparesis    GERD (gastroesophageal reflux disease)    History of hypotension    taken midodrine in the past- Spring of 2022   Hypermobile Ehlers-Danlos syndrome    Irregular heart rate    Keratosis pilaris    Migraine    PONV (postoperative nausea and vomiting)    "I can get really cold"   PTSD (post-traumatic stress disorder)    Seasonal allergic rhinitis 03/11/2014   Past Surgical History:  Procedure Laterality Date   CHOLECYSTECTOMY  05/09/2021   ESOPHAGOGASTRODUODENOSCOPY ENDOSCOPY     03/2020   HIP ARTHROSCOPY Right 04/25/2022   Procedure: RIGHT HIP ARTHROSCOPY WITH LABRAL RECONSTRUCTION, ILIOTIBIAL BAND ALLOGRAFT, PSOAS RELEASE;  Surgeon: Vanetta Mulders, MD;  Location: [place];  Service: Orthopedics;  Laterality: Right;   LABRAL REPAIR Right 03/02/2021   Procedure: RIGHT HIP ARTHROSCOPY WITH LABRAL REPAIR AND PINCE DEBRIDEMENT;  Surgeon: Vanetta Mulders, MD;  Location: [place];  Service: Orthopedics;   Laterality: Right;   SMART PILL PROCEDURE     12/2020   TYMPANOSTOMY TUBE PLACEMENT Bilateral    WISDOM TOOTH EXTRACTION     Patient Active Problem List   Diagnosis Date Noted   Cervical adenopathy 06/10/2021   Chronic pruritus 06/10/2021   Exercise-induced asthma 06/09/2021   Chronic venous insufficiency 06/03/2021   Orthostatic lightheadedness 06/03/2021   Purple toe syndrome of both feet ([place]) 06/03/2021   Autonomic dysfunction 06/03/2021   Tear of right acetabular labrum 04/13/2021   Migraine headache 03/01/2021   Headache 11/30/2020   Positive ANA (antinuclear antibody) 11/15/2020   Fibromyalgia 11/02/2020   Hair loss 11/02/2020   Muscle spasticity 10/22/2020   Hypermobile Ehlers-Danlos syndrome 10/22/2020   Paresthesia of bilateral legs 10/06/2020   Myalgia 10/06/2020   Chronic pelvic pain in female 10/06/2020   Chronic abdominal pain 10/06/2020   Irritable colon 08/02/2020   Pelvic floor dysfunction 08/02/2020   Irritant dermatitis 08/02/2020   Gastroesophageal reflux disease 06/28/2020   Severe recurrent major depression without psychotic features ([place]) 06/28/2020   Polyarthralgia 06/28/2020   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 06/28/2020   Tachycardia 06/28/2020   History of COVID-19 06/28/2020   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 deficit 06/28/2020   Recurrent major depressive disorder, in full remission ([place])  06/28/2020   Atypical chest pain 06/22/2020   Dizziness 06/22/2020   Palpitations 06/14/2020   Anxiety 05/04/2020   Internal and external hemorrhoids without complication [id number]   Nondiabetic gastroparesis 03/03/2020   ETD (Eustachian tube dysfunction), bilateral 10/21/2019   Acne vulgaris 03/11/2014   Mild intermittent asthma without complication 123XX123   Seasonal allergies 03/11/2014    REFERRING PROVIDER:  Vanetta Mulders, MD     REFERRING DIAG: [phone] (ICD-10-CM) - Tear of right acetabular  labrum, subsequent encounter  post op 04/25/22 R hip labral repair revision with IT band allograft   THERAPY DIAG:  Pain in right hip  Muscle weakness (generalized)  Difficulty in walking, not elsewhere classified  Rationale for Evaluation and Treatment: Rehabilitation  ONSET DATE: DOS 04/25/22  Days since surgery: 102  SUBJECTIVE:   SUBJECTIVE STATEMENT: Patient reports continued pain in anterior and posterior portions of hip.   PERTINENT HISTORY: EDS PAIN:  Are you having pain? Yes: NPRS scale: 7/10 Pain location: rt hip Pain description: sore/achey, tight, clicks deep- more medial Aggravating factors: movement Relieving factors: ice  PRECAUTIONS:none  WEIGHT BEARING RESTRICTIONS: no longer has   FALLS:  Has patient fallen in last 6 months? 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  PLOF: Independent  PATIENT GOALS: yoga, take dance classes  OBJECTIVE:   PATIENT SURVEYS:  FOTO EVAL: 27 2/2: 59 2/29: 57  POSTURE:WFL on 1/19  PALPATION: 3/14- able to palpate ITB snapping over greater trochanter throughout hip flexion  LOWER EXTREMITY ROM:  Passive ROM Right eval Right 05/23/22 Rt  06/21/22 Rt AROM 2/29  Hip flexion 80 90 100 pinch  126 slight ant pinch  Hip extension 0     Hip abduction 30   40  Hip adduction      Hip internal rotation   25   Hip external rotation   45    (Blank rows = not tested) MMT(lb) Right 1/19 Left 1/19 Rt LE  06/21/22 Rt  2/29  Hip flexion 6.4 24.8    Hip extension   Can do single leg bridge 10 sec hold    Hip abduction 22.6 30.8 4+/5 31.5  Hip adduction      Hip internal rotation      Hip external rotation      Knee flexion 11.0 20.4  18.2  Knee extension 34.2 33.0  33.0   (Blank rows = not tested)   GAIT: EVAL: TDWB with bil axillary crutches 05/23/22: able to ambulate without AD, locked in neutral for hip extension 06/09/22: gait pattern without antalgia, good ROM and heel strike, no trendelenburg noted   TODAY'S  TREATMENT:      [facility] Adult PT Treatment:                                                DATE: 08/05/22 Therapeutic Exercise: Sidelying clamshell x20 Sidelying clamshell YTB 2x10 Sidelying hip abduction with extension bias 2x10 Prone hip extension 2x10 Manual Therapy: IASTM Rt ITB, quads, HS, TFL, proximal hip flexor tendons Passive flexion in sidelying for quads & ITB/TFL IASTM proximal hip flexor tendons   Treatment                            3/14:  IASTM Rt ITB, quads, HS, TFL, proximal hip flexor tendons Passive flexion in sidelying  for quads & ITB/TFL- also added contract/relax to both IASTM proximal hip flexor tendons Skin rolling    PATIENT EDUCATION:  Education details: Anatomy of condition, POC, HEP, exercise form/rationale Person educated: Patient and Parent Education method: Explanation, Demonstration, Tactile cues, Verbal cues, and Handouts Education comprehension: verbalized understanding, returned demonstration, verbal cues required, tactile cues required, and needs further education  HOME EXERCISE PROGRAM:  Access Code: [email or web address] URL: [email or web address]   ASSESSMENT:  CLINICAL IMPRESSION: Patient presents to PT with continued reports of anterior and posterior Rt hip pain, tightness, and tension. We were able to complete several exercises this session to improve strength and function of glutes. She exhibits muscular fatigue with glute exercises, but is able to complete all prescribed exercises. Manual utilized in conjunction with strengthening to decrease tension and tissue restriction while maintaining therapeutic alliance. Patient continues to benefit from skilled PT services and should be progressed as able to improve functional independence.    OBJECTIVE IMPAIRMENTS: Abnormal gait, decreased activity tolerance, decreased balance, difficulty walking, decreased ROM, decreased strength, increased muscle spasms, impaired flexibility, improper body  mechanics, and pain.   ACTIVITY LIMITATIONS: carrying, lifting, bending, sitting, standing, squatting, stairs, transfers, bed mobility, bathing, dressing, and locomotion level  PARTICIPATION LIMITATIONS: meal prep, cleaning, medication management, driving, shopping, community activity, and occupation  PERSONAL FACTORS: 3+ comorbidities: previous labral repair, EDS, autonomic dysfunction  are also affecting patient's functional outcome.     GOALS: Goals reviewed with patient? Yes  SHORT TERM GOALS: Target date: 05/26/22 30s STS without pain Baseline: x12 Goal status: achieved  2.  Able to demo household ambulation without AD and with proper pattern Baseline:  Goal status: achieved    LONG TERM GOALS: Target date: POC date  Able to demo step up on 6" step with level pelvis and proper form Baseline:  Goal status: 06/20/22  2.  Will tolerate at least 3 min on elliptical, demonstrating good tolerance to repetitive weight bearing motion Baseline:  Goal status: achieved Week 8 06/20/22  3.  Demonstrate proper form in at least 10 continuous lunges without increased pain Baseline:  Goal status: ongoing  4.  Demonstrate gentle, double and single foot plyometric motions with good proximal form Baseline:  Goal status: ongoing  5.  Pt will begin gradual return to group fitness with understanding of modifications and precautions Baseline:  Goal status: ongoing    PLAN:  PT FREQUENCY: 1-2x/week  PT DURATION: 12 weeks  PLANNED INTERVENTIONS: Therapeutic exercises, Therapeutic activity, Neuromuscular re-education, Balance training, Gait training, Patient/Family education, Self Care, Joint mobilization, Stair training, Aquatic Therapy, Dry Needling, Electrical stimulation, Spinal mobilization, Cryotherapy, Moist heat, Taping, Traction, Ultrasound, Ionotophoresis 4mg /ml Dexamethasone, and Manual therapy  PLAN FOR NEXT SESSION: Elliptical. Core stability hip abduction and extension  strengthening, balance, start plyos  Margarette Canada PTA 08/05/22 9:47 AM

## 2022-08-05 ENCOUNTER — Ambulatory Visit: Payer: BC Managed Care – PPO

## 2022-08-05 DIAGNOSIS — M25551 Pain in right hip: Secondary | ICD-10-CM | POA: Diagnosis not present

## 2022-08-05 DIAGNOSIS — M6281 Muscle weakness (generalized): Secondary | ICD-10-CM

## 2022-08-05 DIAGNOSIS — R262 Difficulty in walking, not elsewhere classified: Secondary | ICD-10-CM

## 2022-08-08 ENCOUNTER — Encounter (HOSPITAL_BASED_OUTPATIENT_CLINIC_OR_DEPARTMENT_OTHER): Payer: Self-pay | Admitting: Physical Therapy

## 2022-08-08 ENCOUNTER — Ambulatory Visit: Payer: BC Managed Care – PPO

## 2022-08-08 ENCOUNTER — Ambulatory Visit (HOSPITAL_BASED_OUTPATIENT_CLINIC_OR_DEPARTMENT_OTHER): Payer: BC Managed Care – PPO | Admitting: Physical Therapy

## 2022-08-08 ENCOUNTER — Telehealth: Payer: Self-pay

## 2022-08-08 DIAGNOSIS — M6281 Muscle weakness (generalized): Secondary | ICD-10-CM

## 2022-08-08 DIAGNOSIS — M25551 Pain in right hip: Secondary | ICD-10-CM | POA: Diagnosis not present

## 2022-08-08 DIAGNOSIS — R262 Difficulty in walking, not elsewhere classified: Secondary | ICD-10-CM

## 2022-08-08 NOTE — Telephone Encounter (Signed)
Pt called and stated therapist today recommended she come in today to have a xray done. Stated her pain has increased for the last week with painful popping in groin and lateral side of hip. Would you like her to come in today with Dr. Erlinda Hong for a xray or see you this week?

## 2022-08-08 NOTE — Therapy (Signed)
OUTPATIENT PHYSICAL THERAPY LOWER EXTREMITY TREATMENT   Patient Name: Brianna [name] MRN: [id number] DOB:[birth year], 27 y.o., female Today's Date: 08/08/2022  END OF SESSION:  PT End of Session - 08/08/22 1129     Visit Number 24    Number of Visits 36    Date for PT Re-Evaluation 09/15/22    Authorization Type BCBS    Authorization Time Period FOTO v6, v10    PT Start Time 1017    PT Stop Time 1058    PT Time Calculation (min) 41 min    Activity Tolerance Patient tolerated treatment well    Behavior During Therapy [facility] for tasks assessed/performed                Past Medical History:  Diagnosis Date   Acute recurrent pansinusitis 10/21/2019   ADHD (attention deficit hyperactivity disorder)    inattentive   Anemia    had iron infusions in Fall of 2023   Anxiety    Asthma    Bronchitis    Cat allergy due to both airborne and skin contact 05/01/2016   Chronic sore throat 05/01/2016   COVID 2020   Depression    Fibromyalgia    pt denies   Gastroparesis    GERD (gastroesophageal reflux disease)    History of hypotension    taken midodrine in the past- Spring of 2022   Hypermobile Ehlers-Danlos syndrome    Irregular heart rate    Keratosis pilaris    Migraine    PONV (postoperative nausea and vomiting)    "I can get really cold"   PTSD (post-traumatic stress disorder)    Seasonal allergic rhinitis 03/11/2014   Past Surgical History:  Procedure Laterality Date   CHOLECYSTECTOMY  05/09/2021   ESOPHAGOGASTRODUODENOSCOPY ENDOSCOPY     03/2020   HIP ARTHROSCOPY Right 04/25/2022   Procedure: RIGHT HIP ARTHROSCOPY WITH LABRAL RECONSTRUCTION, ILIOTIBIAL BAND ALLOGRAFT, PSOAS RELEASE;  Surgeon: Vanetta Mulders, MD;  Location: [place];  Service: Orthopedics;  Laterality: Right;   LABRAL REPAIR Right 03/02/2021   Procedure: RIGHT HIP ARTHROSCOPY WITH LABRAL REPAIR AND PINCE DEBRIDEMENT;  Surgeon: Vanetta Mulders, MD;  Location: [place];  Service: Orthopedics;   Laterality: Right;   SMART PILL PROCEDURE     12/2020   TYMPANOSTOMY TUBE PLACEMENT Bilateral    WISDOM TOOTH EXTRACTION     Patient Active Problem List   Diagnosis Date Noted   Cervical adenopathy 06/10/2021   Chronic pruritus 06/10/2021   Exercise-induced asthma 06/09/2021   Chronic venous insufficiency 06/03/2021   Orthostatic lightheadedness 06/03/2021   Purple toe syndrome of both feet ([place]) 06/03/2021   Autonomic dysfunction 06/03/2021   Tear of right acetabular labrum 04/13/2021   Migraine headache 03/01/2021   Headache 11/30/2020   Positive ANA (antinuclear antibody) 11/15/2020   Fibromyalgia 11/02/2020   Hair loss 11/02/2020   Muscle spasticity 10/22/2020   Hypermobile Ehlers-Danlos syndrome 10/22/2020   Paresthesia of bilateral legs 10/06/2020   Myalgia 10/06/2020   Chronic pelvic pain in female 10/06/2020   Chronic abdominal pain 10/06/2020   Irritable colon 08/02/2020   Pelvic floor dysfunction 08/02/2020   Irritant dermatitis 08/02/2020   Gastroesophageal reflux disease 06/28/2020   Severe recurrent major depression without psychotic features ([place]) 06/28/2020   Polyarthralgia 06/28/2020   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 06/28/2020   Tachycardia 06/28/2020   History of COVID-19 06/28/2020   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 deficit 06/28/2020   Recurrent major depressive disorder, in full remission ([place])  06/28/2020   Atypical chest pain 06/22/2020   Dizziness 06/22/2020   Palpitations 06/14/2020   Anxiety 05/04/2020   Internal and external hemorrhoids without complication [id number]   Nondiabetic gastroparesis 03/03/2020   ETD (Eustachian tube dysfunction), bilateral 10/21/2019   Acne vulgaris 03/11/2014   Mild intermittent asthma without complication [id number]   Seasonal allergies 03/11/2014    REFERRING PROVIDER:  Vanetta Mulders, MD     REFERRING DIAG: [phone] (ICD-10-CM) - Tear of right acetabular  labrum, subsequent encounter  post op 04/25/22 R hip labral repair revision with IT band allograft   THERAPY DIAG:  Pain in right hip  Muscle weakness (generalized)  Difficulty in walking, not elsewhere classified  Rationale for Evaluation and Treatment: Rehabilitation  ONSET DATE: DOS 04/25/22  Days since surgery: 105  SUBJECTIVE:   SUBJECTIVE STATEMENT: For the last 2 days barely able to put weight on it. Sitting on the toilet is more painful than a regular surface. It feels deep, cannot bend past 90 due to severe pinching. Did a sun salutation on Sat with mild discomfort, really noticed it on Sunday and then did a lot of fast walking at work yesterday with progressive increase in pain through the day. Frequent popping without reason- seems to be lifting leg up (flexion) and rotation.   PERTINENT HISTORY: EDS PAIN:  Are you having pain? Yes: NPRS scale: 7/10 Pain location: rt hip Pain description: sore/achey, tight, clicks deep- more medial Aggravating factors: movement Relieving factors: ice  PRECAUTIONS:none  WEIGHT BEARING RESTRICTIONS: no longer has   FALLS:  Has patient fallen in last 6 months? 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  PLOF: Independent  PATIENT GOALS: yoga, take dance classes  OBJECTIVE:   PATIENT SURVEYS:  FOTO EVAL: 27 2/2: 59 2/29: 57  POSTURE:WFL on 1/19  PALPATION: 3/14- able to palpate ITB snapping over greater trochanter throughout hip flexion  LOWER EXTREMITY ROM:  Passive ROM Right eval Right 05/23/22 Rt  06/21/22 Rt AROM 2/29  Hip flexion 80 90 100 pinch  126 slight ant pinch  Hip extension 0     Hip abduction 30   40  Hip adduction      Hip internal rotation   25   Hip external rotation   45    (Blank rows = not tested) MMT(lb) Right 1/19 Left 1/19 Rt LE  06/21/22 Rt  2/29  Hip flexion 6.4 24.8    Hip extension   Can do single leg bridge 10 sec hold    Hip abduction 22.6 30.8 4+/5 31.5  Hip adduction      Hip  internal rotation      Hip external rotation      Knee flexion 11.0 20.4  18.2  Knee extension 34.2 33.0  33.0   (Blank rows = not tested)   GAIT: EVAL: TDWB with bil axillary crutches 05/23/22: able to ambulate without AD, locked in neutral for hip extension 06/09/22: gait pattern without antalgia, good ROM and heel strike, no trendelenburg noted   TODAY'S TREATMENT:      Treatment                            3/19:  Negative grind test Stretchy end feel with pain and reported pinching- anterior with flexion, post with ER Denies pain with passive extension but reports ant pinching with post to ant mobilizations Palpation to obturator creates concordant pain wrapping around proximal femur.  No pain with  sacrotuberal ligament palpation Denies pain with SIJ spring testing All musculature activating to lift leg against gravity Reports pain but not concordant upon palpation to greater trochanter and ischial tuberosity Tightness in biceps femoris that pt reports feels similar to her pain MANUAL STM to obturator Negative slump Concordant pain upon palpation to meeting point of TFL and ITB  Denies fevers, chills. Usually has night sweats, feels pretty sniffly, stopped taking ati-depressant on Friday.  Took ibuprofen which I think helped, ice helps when covering a large surface areas Antalgic gait pattern- does present hip extension at toe off    [facility] Adult PT Treatment:                                                DATE: 08/05/22 Therapeutic Exercise: Sidelying clamshell x20 Sidelying clamshell YTB 2x10 Sidelying hip abduction with extension bias 2x10 Prone hip extension 2x10 Manual Therapy: IASTM Rt ITB, quads, HS, TFL, proximal hip flexor tendons Passive flexion in sidelying for quads & ITB/TFL IASTM proximal hip flexor tendons   Treatment                            3/14:  IASTM Rt ITB, quads, HS, TFL, proximal hip flexor tendons Passive flexion in sidelying for quads &  ITB/TFL- also added contract/relax to both IASTM proximal hip flexor tendons Skin rolling    PATIENT EDUCATION:  Education details: Anatomy of condition, POC, HEP, exercise form/rationale Person educated: Patient and Parent Education method: Explanation, Demonstration, Tactile cues, Verbal cues, and Handouts Education comprehension: verbalized understanding, returned demonstration, verbal cues required, tactile cues required, and needs further education  HOME EXERCISE PROGRAM:  Access Code: [email or web address] URL: [email or web address]   ASSESSMENT:  CLINICAL IMPRESSION: Difficult to diagnose primary problem with diffuse pain. Negative lumbar testing and SIJ testing. Impingement is consistent and I recommended a f/u with surgeon at the earliest convenience. Encouraged her to use appropriate methods of pain control and continue with gentle movement.   OBJECTIVE IMPAIRMENTS: Abnormal gait, decreased activity tolerance, decreased balance, difficulty walking, decreased ROM, decreased strength, increased muscle spasms, impaired flexibility, improper body mechanics, and pain.   ACTIVITY LIMITATIONS: carrying, lifting, bending, sitting, standing, squatting, stairs, transfers, bed mobility, bathing, dressing, and locomotion level  PARTICIPATION LIMITATIONS: meal prep, cleaning, medication management, driving, shopping, community activity, and occupation  PERSONAL FACTORS: 3+ comorbidities: previous labral repair, EDS, autonomic dysfunction  are also affecting patient's functional outcome.     GOALS: Goals reviewed with patient? Yes  SHORT TERM GOALS: Target date: 05/26/22 30s STS without pain Baseline: x12 Goal status: achieved  2.  Able to demo household ambulation without AD and with proper pattern Baseline:  Goal status: achieved    LONG TERM GOALS: Target date: POC date  Able to demo step up on 6" step with level pelvis and proper form Baseline:  Goal status:  06/20/22  2.  Will tolerate at least 3 min on elliptical, demonstrating good tolerance to repetitive weight bearing motion Baseline:  Goal status: achieved Week 8 06/20/22  3.  Demonstrate proper form in at least 10 continuous lunges without increased pain Baseline:  Goal status: ongoing  4.  Demonstrate gentle, double and single foot plyometric motions with good proximal form Baseline:  Goal status: ongoing  5.  Pt will begin gradual return to group fitness  with understanding of modifications and precautions Baseline:  Goal status: ongoing    PLAN:  PT FREQUENCY: 1-2x/week  PT DURATION: 12 weeks  PLANNED INTERVENTIONS: Therapeutic exercises, Therapeutic activity, Neuromuscular re-education, Balance training, Gait training, Patient/Family education, Self Care, Joint mobilization, Stair training, Aquatic Therapy, Dry Needling, Electrical stimulation, Spinal mobilization, Cryotherapy, Moist heat, Taping, Traction, Ultrasound, Ionotophoresis 4mg /ml Dexamethasone, and Manual therapy  PLAN FOR NEXT SESSION: STM to hip musculature PROM with mobs as tolerated.   Trayce Caravello C. Nahuel Wilbert PT, DPT 08/08/22 11:30 AM

## 2022-08-09 ENCOUNTER — Other Ambulatory Visit (HOSPITAL_BASED_OUTPATIENT_CLINIC_OR_DEPARTMENT_OTHER): Payer: Self-pay

## 2022-08-09 ENCOUNTER — Ambulatory Visit (INDEPENDENT_AMBULATORY_CARE_PROVIDER_SITE_OTHER): Payer: BC Managed Care – PPO | Admitting: Orthopaedic Surgery

## 2022-08-09 ENCOUNTER — Ambulatory Visit: Payer: BC Managed Care – PPO

## 2022-08-09 ENCOUNTER — Encounter (HOSPITAL_BASED_OUTPATIENT_CLINIC_OR_DEPARTMENT_OTHER): Payer: Self-pay | Admitting: Orthopaedic Surgery

## 2022-08-09 DIAGNOSIS — S73191D Other sprain of right hip, subsequent encounter: Secondary | ICD-10-CM

## 2022-08-09 MED ORDER — METHYLPREDNISOLONE 4 MG PO TBPK
ORAL_TABLET | ORAL | 0 refills | Status: DC
  Filled 2022-08-09: qty 21, 6d supply, fill #0

## 2022-08-09 NOTE — Progress Notes (Signed)
Post Operative Evaluation    Procedure/Date of Surgery: Right hip arthroscopy with labral reconstruction 12/5  Interval History:   08/09/2022: Presents today for follow-up status post right hip arthroscopy.  She has been experiencing deeper pain in the gluteal fold that is rating down the posterior thigh.  This is worse with truncal flexion.  This has been making physical therapy and working quite difficult.  She is having a hard time with flexion about the head.  PMH/PSH/Family History/Social History/Meds/Allergies:    Past Medical History:  Diagnosis Date   Acute recurrent pansinusitis 10/21/2019   ADHD (attention deficit hyperactivity disorder)    inattentive   Anemia    had iron infusions in Fall of 2023   Anxiety    Asthma    Bronchitis    Cat allergy due to both airborne and skin contact 05/01/2016   Chronic sore throat 05/01/2016   COVID 2020   Depression    Fibromyalgia    pt denies   Gastroparesis    GERD (gastroesophageal reflux disease)    History of hypotension    taken midodrine in the past- Spring of 2022   Hypermobile Ehlers-Danlos syndrome    Irregular heart rate    Keratosis pilaris    Migraine    PONV (postoperative nausea and vomiting)    "I can get really cold"   PTSD (post-traumatic stress disorder)    Seasonal allergic rhinitis 03/11/2014   Past Surgical History:  Procedure Laterality Date   CHOLECYSTECTOMY  05/09/2021   ESOPHAGOGASTRODUODENOSCOPY ENDOSCOPY     03/2020   HIP ARTHROSCOPY Right 04/25/2022   Procedure: RIGHT HIP ARTHROSCOPY WITH LABRAL RECONSTRUCTION, ILIOTIBIAL BAND ALLOGRAFT, PSOAS RELEASE;  Surgeon: Vanetta Mulders, MD;  Location: [place];  Service: Orthopedics;  Laterality: Right;   LABRAL REPAIR Right 03/02/2021   Procedure: RIGHT HIP ARTHROSCOPY WITH LABRAL REPAIR AND PINCE DEBRIDEMENT;  Surgeon: Vanetta Mulders, MD;  Location: [place];  Service: Orthopedics;  Laterality: Right;   SMART PILL  PROCEDURE     12/2020   TYMPANOSTOMY TUBE PLACEMENT Bilateral    WISDOM TOOTH EXTRACTION     Social History   Socioeconomic History   Marital status: Single    Spouse name: Not on file   Number of children: Not on file   Years of education: Not on file   Highest education level: Not on file  Occupational History   Not on file  Tobacco Use   Smoking status: Former    Types: E-cigarettes, Cigarettes    Quit date: 2021    Years since quitting: 3.2   Smokeless tobacco: Never  Vaping Use   Vaping Use: Former   Substances: Editor, commissioning  Substance and Sexual Activity   Alcohol use: Not Currently   Drug use: Yes    Frequency: 7.0 times per week    Types: Marijuana   Sexual activity: Yes    Birth control/protection: None  Other Topics Concern   Not on file  Social History Narrative   Right handed   Social Determinants of Health   Financial Resource Strain: Not on file  Food Insecurity: Not on file  Transportation Needs: Not on file  Physical Activity: Not on file  Stress: Not on file  Social Connections: Not on file   Family History  Problem Relation Age of Onset  Diabetes Mother    Polycystic ovary syndrome Mother    GER disease Father    Heart disease Paternal Uncle    Allergies  Allergen Reactions   Clindamycin Hives, Swelling and Rash   Tretinoin Dermatitis, Itching, Photosensitivity, Swelling and Other (See Comments)   Current Outpatient Medications  Medication Sig Dispense Refill   cyclobenzaprine (FLEXERIL) 10 MG tablet Take 1 tablet (10 mg total) by mouth 3 (three) times daily as needed for muscle spasms. 30 tablet 0   methylPREDNISolone (MEDROL DOSEPAK) 4 MG TBPK tablet Take per packet instructions 21 tablet 0   aspirin EC 325 MG tablet TAKE 1 TABLET BY MOUTH EVERY DAY 90 tablet 1   clobetasol (TEMOVATE) 0.05 % external solution Apply 1 Application topically daily as needed (Irritation).     clonazePAM (KLONOPIN) 1 MG tablet Take 1 mg by mouth  daily as needed for anxiety.     Dextromethorphan-buPROPion ER (AUVELITY) 45-105 MG TBCR Take 1 tablet by mouth 2 (two) times daily.     Drospirenone (SLYND) 4 MG TABS Take 1 tablet by mouth daily. 28 tablet 11   finasteride (PROPECIA) 1 MG tablet Take 1 mg by mouth daily.     Galcanezumab-gnlm (EMGALITY) 120 MG/ML SOAJ Inject 120 mg into the skin every 30 (thirty) days.     loratadine (CLARITIN) 10 MG tablet Take 10 mg by mouth daily.     meloxicam (MOBIC) 15 MG tablet Take 1 tablet (15 mg total) by mouth daily. 14 tablet 0   naratriptan (AMERGE) 2.5 MG tablet Take 2.5 mg by mouth as needed for migraine.     Omega-3 Fatty Acids (FISH OIL PO) Take 1,400 Units by mouth daily. Mini     ondansetron (ZOFRAN ODT) 4 MG disintegrating tablet Take 1 tablet (4 mg total) by mouth every 8 (eight) hours as needed for nausea or vomiting. 20 tablet 5   Pediatric Multiple Vitamins (PEDIATRIC MULTIVITAMIN) chewable tablet Chew 1 tablet by mouth daily.     promethazine (PHENERGAN) 25 MG tablet Take 1 tablet (25 mg total) by mouth every 6 (six) hours as needed for nausea or vomiting. (Patient taking differently: Take 12.5 mg by mouth every 6 (six) hours as needed for nausea or vomiting.) 30 tablet 0   tazarotene (TAZORAC) 0.05 % cream Apply 1 Application topically every other day.     tranexamic acid (LYSTEDA) 650 MG TABS tablet Take 2 tablets (1,300 mg total) by mouth 3 (three) times daily. Take during menses for a maximum of five days 30 tablet 2   triamcinolone cream (KENALOG) 0.1 % Apply 1 Application topically daily as needed (Eczma).     No current facility-administered medications for this visit.   No results found.  Review of Systems:   A ROS was performed including pertinent positives and negatives as documented in the HPI.   Musculoskeletal Exam:    Right hip incisions are healed.  Range of motion is 30 degrees internal rotation and 35 degrees external rotation without significant pain.  Tenderness  about ischial tuberosity posteriorly with posterior thigh radiation improved abduction strength neurosensory exam is intact  Imaging:    None  I personally reviewed and interpreted the radiographs.   Assessment:   27 year old female status post right hip arthroscopic labral reconstruction.  Overall at today's visit she has having predominantly hamstring symptoms with tightness.  This is worse with truncal flexion with pain around the ischial tuberosity.  At this time I have offered treatment options including dry needling of  the posterior hamstrings versus shock therapy with Dr. Rolena Infante.  She would like to begin with shock therapy. Plan :    -Return to clinic for regularly scheduled appointment   I personally saw and evaluated the patient, and participated in the management and treatment plan.  Vanetta Mulders, MD Attending Physician, Orthopedic Surgery  This document was dictated using Dragon voice recognition software. A reasonable attempt at proof reading has been made to minimize errors.

## 2022-08-11 ENCOUNTER — Ambulatory Visit (INDEPENDENT_AMBULATORY_CARE_PROVIDER_SITE_OTHER): Payer: BC Managed Care – PPO | Admitting: Sports Medicine

## 2022-08-11 ENCOUNTER — Encounter (HOSPITAL_BASED_OUTPATIENT_CLINIC_OR_DEPARTMENT_OTHER): Payer: Self-pay | Admitting: Orthopaedic Surgery

## 2022-08-11 ENCOUNTER — Encounter: Payer: Self-pay | Admitting: Sports Medicine

## 2022-08-11 VITALS — BP 109/75 | HR 91 | Ht 64.0 in | Wt 115.4 lb

## 2022-08-11 DIAGNOSIS — M76899 Other specified enthesopathies of unspecified lower limb, excluding foot: Secondary | ICD-10-CM

## 2022-08-11 DIAGNOSIS — S73191S Other sprain of right hip, sequela: Secondary | ICD-10-CM

## 2022-08-11 DIAGNOSIS — S73191D Other sprain of right hip, subsequent encounter: Secondary | ICD-10-CM | POA: Diagnosis not present

## 2022-08-11 NOTE — Progress Notes (Signed)
Patient presents with pain posterior and anterior proximal thigh. Pain extends medial and lateral right leg all of the way down to the ankle. She has had right hip arthroscopy x 2. She states that she has not noticed any relief post operatively. She is currently taking steroid pack. She attempted to take ibuprofen with it yesterday, however, she became sick with vomiting when mixing the two medications.

## 2022-08-11 NOTE — Therapy (Signed)
OUTPATIENT PHYSICAL THERAPY LOWER EXTREMITY TREATMENT   Patient Name: Brianna [name] MRN: [id number] DOB:Sep 27, 1995, 27 y.o., female Today's Date: 08/12/2022  END OF SESSION:  PT End of Session - 08/12/22 0947     Visit Number 25    Number of Visits 36    Date for PT Re-Evaluation 09/15/22    Authorization Type BCBS    Authorization Time Period FOTO v6, v10    PT Start Time [phone]    PT Stop Time 1027    PT Time Calculation (min) 40 min    Activity Tolerance Patient tolerated treatment well    Behavior During Therapy [facility] for tasks assessed/performed               Past Medical History:  Diagnosis Date   Acute recurrent pansinusitis 10/21/2019   ADHD (attention deficit hyperactivity disorder)    inattentive   Anemia    had iron infusions in Fall of 2023   Anxiety    Asthma    Bronchitis    Cat allergy due to both airborne and skin contact 05/01/2016   Chronic sore throat 05/01/2016   COVID 2020   Depression    Fibromyalgia    pt denies   Gastroparesis    GERD (gastroesophageal reflux disease)    History of hypotension    taken midodrine in the past- Spring of 2022   Hypermobile Ehlers-Danlos syndrome    Irregular heart rate    Keratosis pilaris    Migraine    PONV (postoperative nausea and vomiting)    "I can get really cold"   PTSD (post-traumatic stress disorder)    Seasonal allergic rhinitis 03/11/2014   Past Surgical History:  Procedure Laterality Date   CHOLECYSTECTOMY  05/09/2021   ESOPHAGOGASTRODUODENOSCOPY ENDOSCOPY     03/2020   HIP ARTHROSCOPY Right 04/25/2022   Procedure: RIGHT HIP ARTHROSCOPY WITH LABRAL RECONSTRUCTION, ILIOTIBIAL BAND ALLOGRAFT, PSOAS RELEASE;  Surgeon: Vanetta Mulders, MD;  Location: [place];  Service: Orthopedics;  Laterality: Right;   LABRAL REPAIR Right 03/02/2021   Procedure: RIGHT HIP ARTHROSCOPY WITH LABRAL REPAIR AND PINCE DEBRIDEMENT;  Surgeon: Vanetta Mulders, MD;  Location: [place];  Service: Orthopedics;   Laterality: Right;   SMART PILL PROCEDURE     12/2020   TYMPANOSTOMY TUBE PLACEMENT Bilateral    WISDOM TOOTH EXTRACTION     Patient Active Problem List   Diagnosis Date Noted   Cervical adenopathy 06/10/2021   Chronic pruritus 06/10/2021   Exercise-induced asthma 06/09/2021   Chronic venous insufficiency 06/03/2021   Orthostatic lightheadedness 06/03/2021   Purple toe syndrome of both feet ([place]) 06/03/2021   Autonomic dysfunction 06/03/2021   Tear of right acetabular labrum 04/13/2021   Migraine headache 03/01/2021   Headache 11/30/2020   Positive ANA (antinuclear antibody) 11/15/2020   Fibromyalgia 11/02/2020   Hair loss 11/02/2020   Muscle spasticity 10/22/2020   Hypermobile Ehlers-Danlos syndrome 10/22/2020   Paresthesia of bilateral legs 10/06/2020   Myalgia 10/06/2020   Chronic pelvic pain in female 10/06/2020   Chronic abdominal pain 10/06/2020   Irritable colon 08/02/2020   Pelvic floor dysfunction 08/02/2020   Irritant dermatitis 08/02/2020   Gastroesophageal reflux disease 06/28/2020   Severe recurrent major depression without psychotic features ([place]) 06/28/2020   Polyarthralgia 06/28/2020   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 06/28/2020   Tachycardia 06/28/2020   History of COVID-19 06/28/2020   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 deficit 06/28/2020   Recurrent major depressive disorder, in full remission ([place]) 06/28/2020  Atypical chest pain 06/22/2020   Dizziness 06/22/2020   Palpitations 06/14/2020   Anxiety 05/04/2020   Internal and external hemorrhoids without complication [id number]   Nondiabetic gastroparesis 03/03/2020   ETD (Eustachian tube dysfunction), bilateral 10/21/2019   Acne vulgaris 03/11/2014   Mild intermittent asthma without complication [id number]   Seasonal allergies 03/11/2014    REFERRING PROVIDER:  Vanetta Mulders, MD     REFERRING DIAG: [phone] (ICD-10-CM) - Tear of right acetabular  labrum, subsequent encounter  post op 04/25/22 R hip labral repair revision with IT band allograft   THERAPY DIAG:  Pain in right hip  Muscle weakness (generalized)  Difficulty in walking, not elsewhere classified  Rationale for Evaluation and Treatment: Rehabilitation  ONSET DATE: DOS 04/25/22  Days since surgery: 109  SUBJECTIVE:   SUBJECTIVE STATEMENT: Patient reports she hasn't been able to keep food down for 3 days due to being on steroid pack. She had shockwave therapy yesterday which was painful, but not intolerable.  She states that this week has been terrible, increased pain going down to her calf.    PERTINENT HISTORY: EDS PAIN:  Are you having pain? Yes: NPRS scale: 7/10 Pain location: rt hip Pain description: sore/achey, tight, clicks deep- more medial Aggravating factors: movement Relieving factors: ice  PRECAUTIONS:none  WEIGHT BEARING RESTRICTIONS: no longer has   FALLS:  Has patient fallen in last 6 months? 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  PLOF: Independent  PATIENT GOALS: yoga, take dance classes  OBJECTIVE:   PATIENT SURVEYS:  FOTO EVAL: 27 2/2: 59 2/29: 57  POSTURE:WFL on 1/19  PALPATION: 3/14- able to palpate ITB snapping over greater trochanter throughout hip flexion  LOWER EXTREMITY ROM:  Passive ROM Right eval Right 05/23/22 Rt  06/21/22 Rt AROM 2/29  Hip flexion 80 90 100 pinch  126 slight ant pinch  Hip extension 0     Hip abduction 30   40  Hip adduction      Hip internal rotation   25   Hip external rotation   45    (Blank rows = not tested) MMT(lb) Right 1/19 Left 1/19 Rt LE  06/21/22 Rt  2/29  Hip flexion 6.4 24.8    Hip extension   Can do single leg bridge 10 sec hold    Hip abduction 22.6 30.8 4+/5 31.5  Hip adduction      Hip internal rotation      Hip external rotation      Knee flexion 11.0 20.4  18.2  Knee extension 34.2 33.0  33.0   (Blank rows = not tested)   GAIT: EVAL: TDWB with bil axillary  crutches 05/23/22: able to ambulate without AD, locked in neutral for hip extension 06/09/22: gait pattern without antalgia, good ROM and heel strike, no trendelenburg noted   TODAY'S TREATMENT:  [facility] Adult PT Treatment:                                                DATE: 08/12/22 Therapeutic Exercise: Sidelying clamshell x10 Supine hamstring stretch 3x30' Rt Manual Therapy: IASTM Rt ITB, quads, HS, TFL, proximal hip flexor tendons, hamstrings, calf (discussion of TPDN to hamstrings, pt familiar with technique)       Treatment  3/19:  Negative grind test Stretchy end feel with pain and reported pinching- anterior with flexion, post with ER Denies pain with passive extension but reports ant pinching with post to ant mobilizations Palpation to obturator creates concordant pain wrapping around proximal femur.  No pain with sacrotuberal ligament palpation Denies pain with SIJ spring testing All musculature activating to lift leg against gravity Reports pain but not concordant upon palpation to greater trochanter and ischial tuberosity Tightness in biceps femoris that pt reports feels similar to her pain MANUAL STM to obturator Negative slump Concordant pain upon palpation to meeting point of TFL and ITB  Denies fevers, chills. Usually has night sweats, feels pretty sniffly, stopped taking ati-depressant on Friday.  Took ibuprofen which I think helped, ice helps when covering a large surface areas Antalgic gait pattern- does present hip extension at toe off    [facility] Adult PT Treatment:                                                DATE: 08/05/22 Therapeutic Exercise: Sidelying clamshell x20 Sidelying clamshell YTB 2x10 Sidelying hip abduction with extension bias 2x10 Prone hip extension 2x10 Manual Therapy: IASTM Rt ITB, quads, HS, TFL, proximal hip flexor tendons Passive flexion in sidelying for quads & ITB/TFL IASTM proximal hip flexor  tendons    PATIENT EDUCATION:  Education details: Anatomy of condition, POC, HEP, exercise form/rationale Person educated: Patient and Parent Education method: Explanation, Demonstration, Tactile cues, Verbal cues, and Handouts Education comprehension: verbalized understanding, returned demonstration, verbal cues required, tactile cues required, and needs further education  HOME EXERCISE PROGRAM:  Access Code: [email or web address] URL: [email or web address]   ASSESSMENT:  CLINICAL IMPRESSION: Patient presents to PT with increased pain in entirety of Rt hip and down to Rt calf today, stating that her pain has been much worse this week. She did her first session of shockwave therapy yesterday, reporting that this increased her pain temporarily. Session today focused on stretching for hamstrings as well as manual techniques to decrease tension, adding in calf muscles today to good effect. Patient continues to benefit from skilled PT services and should be progressed as able to improve functional independence.    OBJECTIVE IMPAIRMENTS: Abnormal gait, decreased activity tolerance, decreased balance, difficulty walking, decreased ROM, decreased strength, increased muscle spasms, impaired flexibility, improper body mechanics, and pain.   ACTIVITY LIMITATIONS: carrying, lifting, bending, sitting, standing, squatting, stairs, transfers, bed mobility, bathing, dressing, and locomotion level  PARTICIPATION LIMITATIONS: meal prep, cleaning, medication management, driving, shopping, community activity, and occupation  PERSONAL FACTORS: 3+ comorbidities: previous labral repair, EDS, autonomic dysfunction  are also affecting patient's functional outcome.     GOALS: Goals reviewed with patient? Yes  SHORT TERM GOALS: Target date: 05/26/22 30s STS without pain Baseline: x12 Goal status: achieved  2.  Able to demo household ambulation without AD and with proper pattern Baseline:  Goal status:  achieved    LONG TERM GOALS: Target date: POC date  Able to demo step up on 6" step with level pelvis and proper form Baseline:  Goal status: 06/20/22  2.  Will tolerate at least 3 min on elliptical, demonstrating good tolerance to repetitive weight bearing motion Baseline:  Goal status: achieved Week 8 06/20/22  3.  Demonstrate proper form in at least 10 continuous lunges without increased pain Baseline:  Goal status: ongoing  4.  Demonstrate gentle, double and single foot plyometric motions with good proximal form Baseline:  Goal status: ongoing  5.  Pt will begin gradual return to group fitness with understanding of modifications and precautions Baseline:  Goal status: ongoing    PLAN:  PT FREQUENCY: 1-2x/week  PT DURATION: 12 weeks  PLANNED INTERVENTIONS: Therapeutic exercises, Therapeutic activity, Neuromuscular re-education, Balance training, Gait training, Patient/Family education, Self Care, Joint mobilization, Stair training, Aquatic Therapy, Dry Needling, Electrical stimulation, Spinal mobilization, Cryotherapy, Moist heat, Taping, Traction, Ultrasound, Ionotophoresis 4mg /ml Dexamethasone, and Manual therapy  PLAN FOR NEXT SESSION: STM to hip musculature PROM with mobs as tolerated.   Margarette Canada PTA 08/12/22 9:48 AM

## 2022-08-11 NOTE — Progress Notes (Signed)
Brianna [name] - 27 y.o. female MRN [id number]  Date of birth: Feb 01, 1996  Office Visit Note: Visit Date: 08/11/2022 PCP: Josephina Shih, MD Referred by: Josephina Shih,*  Subjective: Chief Complaint  Patient presents with   Right Leg - Pain   HPI: Brianna [name] is a pleasant 27 y.o. female who presents today for right leg pain s/p labral reconstruction on 04/25/22.  Procedure was performed by my partner, Dr. Sammuel Hines.  Generalized cramping and pain in the gluteal fold that radiates down the posterior thigh. Also having some pain over incision sites and groin.  Has been doing physical therapy with Selinda Eon.  Considering trying shockwave versus dry needling.   Pertinent ROS were reviewed with the patient and found to be negative unless otherwise specified above in HPI.   Assessment & Plan: Visit Diagnoses:  1. Tear of right acetabular labrum, sequela   2. Hamstring tendinitis    Plan: Discussed with Brenly today that she is likely dealing with some reciprocal changes of the soft tissue and musculature from her prior labral reconstruction.  More of her pain is over the proximal hamstring and ischial tuberosity.  Through shared decision-making, elected to proceed with a trial of extracorporeal shockwave treatment therapy here as well as over the anterior hip and the scars to help break down scar tissue.  She will use ice and over-the-counter anti-inflammatories for any soreness.  Would like her to follow-up next week.  Repeat treatment.  If after 2 sessions she is at least 25% better, we will consider additional treatment therapies.  Continue home therapy and PT as indicated.  Follow-up: Return in about 1 week (around 08/18/2022) for for right hip.   Meds & Orders: No orders of the defined types were placed in this encounter.  No orders of the defined types were placed in this encounter.    Procedures: Procedure: ECSWT Indications:  proximal  hamstring tendinitis, s/p hip arthroscopy with labral reconstruction   Procedure Details Consent: Risks of procedure as well as the alternatives and risks of each were explained to the patient.  Verbal consent for procedure obtained. Time Out: Verified patient identification, verified procedure, site was marked, verified correct patient position. The area was cleaned with alcohol swab.     The right ischial tuberosity and proximal hamstring was targeted for Extracorporeal shockwave therapy.    Preset: status post muscular injury Power Level: 110 mJ Frequency: 12 Hz Impulse/cycles: 2500 Head size: Regular  The right anterior hip and proximal quadriceps was targeted for Extracorporeal shockwave therapy.    Preset: status post muscular injury Power Level: 80 mJ Frequency: 10 Hz Impulse/cycles: 1500 Head size: Regular   Patient tolerated procedure well without immediate complications.       Clinical History: No specialty comments available.  She reports that she quit smoking about 3 years ago. Her smoking use included e-cigarettes and cigarettes. She has never used smokeless tobacco. No results for input(s): "HGBA1C", "LABURIC" in the last 8760 hours.  Objective:   Vital Signs: BP 109/75   Pulse 91   Ht 5\' 4"  (1.626 m)   Wt 115 lb 6.4 oz (52.3 kg)   BMI 19.81 kg/m   Physical Exam  Gen: Well-appearing, in no acute distress; non-toxic CV: Regular Rate. Well-perfused. Warm.  Resp: Breathing unlabored on room air; no wheezing. Psych: Fluid speech in conversation; appropriate affect; normal thought process Neuro: Sensation intact throughout. No gross coordination deficits.   Ortho Exam - Right hip: Well healed arthroscopic incisions  on anterior hip. Fluid internal/external rotation. + TTP at ischial tuberosity and proximal hamstrings. Able to perform full range hamstring curl. No redness, swelling. NVI.  Imaging:  Narrative & Impression  CLINICAL DATA:  Right hip pain.   History of labral repair in 2022   EXAM: MRI OF THE RIGHT HIP WITH CONTRAST (MR Arthrogram)   TECHNIQUE: Multiplanar, multisequence MR imaging of the hip was performed immediately following contrast injection into the hip joint under fluoroscopic guidance. No intravenous contrast was administered.   COMPARISON:  MRI 01/27/2021   FINDINGS: Bones: No acute fracture. No dislocation. No femoral head avascular necrosis. Bony pelvis intact without diastasis. SI joints and pubic symphysis within normal limits. No bone marrow edema. No marrow replacing bone lesion.   Articular cartilage and labrum   Articular cartilage: No focal cartilage defect. Surgical anchor sites are seen within the right acetabulum. No subchondral marrow signal changes.   Labrum: Postsurgical changes of prior labral repair. Findings of recurrent full-thickness anterosuperior labral tear (series 13, images 14-15). No paralabral cyst.   Joint or bursal effusion   Joint effusion: Joint is well distended with injected contrast. Lobules of very low signal within the nondependent portion of the hip joint is favored to represent a small amount of air related to the injection. Otherwise no evidence of a loose body.   Bursae: No abnormal bursal fluid collection.   Muscles and tendons   Muscles and tendons: The gluteal, hamstring, iliopsoas, rectus femoris, and adductor tendons appear intact without tear or significant tendinosis. Normal muscle bulk and signal intensity without edema, atrophy, or fatty infiltration. Injection related changes within the musculature overlying the right hip.   Other findings   Miscellaneous: No soft tissue edema or fluid collection. No inguinal lymphadenopathy. Trace free fluid within the pelvis is likely physiologic.   IMPRESSION: Postsurgical changes of prior labral repair with findings of recurrent full-thickness anterosuperior labral tear.     Electronically Signed   By:  Davina Poke D.O.   On: 01/31/2022 13:05    Past Medical/Family/Surgical/Social History: Medications & Allergies reviewed per EMR, new medications updated. 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   Paresthesia of bilateral legs 10/06/2020   Myalgia 10/06/2020   Chronic pelvic pain in female 10/06/2020   Chronic abdominal pain 10/06/2020   Irritable colon 08/02/2020   Pelvic floor dysfunction 08/02/2020   Irritant dermatitis 08/02/2020   Gastroesophageal reflux disease 06/28/2020   Severe recurrent major depression without psychotic features ([place]) 06/28/2020   Polyarthralgia 06/28/2020   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 06/28/2020   Tachycardia 06/28/2020   History of COVID-19 06/28/2020   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 deficit 06/28/2020   Recurrent major depressive disorder, in full remission ([place]) 06/28/2020   Atypical chest pain 06/22/2020   Dizziness 06/22/2020   Palpitations 06/14/2020   Anxiety 05/04/2020   Internal and external hemorrhoids without complication [id number]   Nondiabetic gastroparesis 03/03/2020   ETD (Eustachian tube dysfunction), bilateral 10/21/2019  Acne vulgaris 03/11/2014   Mild intermittent asthma without complication [id number]   Seasonal allergies 03/11/2014   Past Medical History:  Diagnosis Date   Acute recurrent pansinusitis 10/21/2019    ADHD (attention deficit hyperactivity disorder)    inattentive   Anemia    had iron infusions in Fall of 2023   Anxiety    Asthma    Bronchitis    Cat allergy due to both airborne and skin contact 05/01/2016   Chronic sore throat 05/01/2016   COVID 2020   Depression    Fibromyalgia    pt denies   Gastroparesis    GERD (gastroesophageal reflux disease)    History of hypotension    taken midodrine in the past- Spring of 2022   Hypermobile Ehlers-Danlos syndrome    Irregular heart rate    Keratosis pilaris    Migraine    PONV (postoperative nausea and vomiting)    "I can get really cold"   PTSD (post-traumatic stress disorder)    Seasonal allergic rhinitis 03/11/2014   Family History  Problem Relation Age of Onset   Diabetes Mother    Polycystic ovary syndrome Mother    GER disease Father    Heart disease Paternal Uncle    Past Surgical History:  Procedure Laterality Date   CHOLECYSTECTOMY  05/09/2021   ESOPHAGOGASTRODUODENOSCOPY ENDOSCOPY     03/2020   HIP ARTHROSCOPY Right 04/25/2022   Procedure: RIGHT HIP ARTHROSCOPY WITH LABRAL RECONSTRUCTION, ILIOTIBIAL BAND ALLOGRAFT, PSOAS RELEASE;  Surgeon: Vanetta Mulders, MD;  Location: [facility];  Service: Orthopedics;  Laterality: Right;   LABRAL REPAIR Right 03/02/2021   Procedure: RIGHT HIP ARTHROSCOPY WITH LABRAL REPAIR AND PINCE DEBRIDEMENT;  Surgeon: Vanetta Mulders, MD;  Location: [facility];  Service: Orthopedics;  Laterality: Right;   SMART PILL PROCEDURE     12/2020   TYMPANOSTOMY TUBE PLACEMENT Bilateral    WISDOM TOOTH EXTRACTION     Social History   Occupational History   Not on file  Tobacco Use   Smoking status: Former    Types: E-cigarettes, Cigarettes    Quit date: 2021    Years since quitting: 3.2   Smokeless tobacco: Never  Vaping Use   Vaping Use: Former   Substances: Editor, commissioning  Substance and Sexual Activity   Alcohol use: Not Currently   Drug use: Yes    Frequency: 7.0 times per week     Types: Marijuana   Sexual activity: Yes    Birth control/protection: None

## 2022-08-12 ENCOUNTER — Ambulatory Visit: Payer: BC Managed Care – PPO

## 2022-08-12 DIAGNOSIS — M25551 Pain in right hip: Secondary | ICD-10-CM | POA: Diagnosis not present

## 2022-08-12 DIAGNOSIS — R262 Difficulty in walking, not elsewhere classified: Secondary | ICD-10-CM

## 2022-08-12 DIAGNOSIS — M6281 Muscle weakness (generalized): Secondary | ICD-10-CM

## 2022-08-14 ENCOUNTER — Ambulatory Visit: Payer: BC Managed Care – PPO | Admitting: Sports Medicine

## 2022-08-15 ENCOUNTER — Ambulatory Visit (HOSPITAL_BASED_OUTPATIENT_CLINIC_OR_DEPARTMENT_OTHER): Payer: BC Managed Care – PPO | Admitting: Physical Therapy

## 2022-08-15 DIAGNOSIS — M25551 Pain in right hip: Secondary | ICD-10-CM

## 2022-08-15 DIAGNOSIS — M6281 Muscle weakness (generalized): Secondary | ICD-10-CM

## 2022-08-15 DIAGNOSIS — R262 Difficulty in walking, not elsewhere classified: Secondary | ICD-10-CM

## 2022-08-16 ENCOUNTER — Ambulatory Visit (INDEPENDENT_AMBULATORY_CARE_PROVIDER_SITE_OTHER): Payer: BC Managed Care – PPO

## 2022-08-16 ENCOUNTER — Ambulatory Visit: Payer: BC Managed Care – PPO | Admitting: Physical Therapy

## 2022-08-16 ENCOUNTER — Ambulatory Visit (INDEPENDENT_AMBULATORY_CARE_PROVIDER_SITE_OTHER): Payer: BC Managed Care – PPO | Admitting: Student

## 2022-08-16 ENCOUNTER — Encounter (HOSPITAL_BASED_OUTPATIENT_CLINIC_OR_DEPARTMENT_OTHER): Payer: Self-pay | Admitting: Orthopaedic Surgery

## 2022-08-16 DIAGNOSIS — M25532 Pain in left wrist: Secondary | ICD-10-CM

## 2022-08-16 NOTE — Progress Notes (Signed)
Chief Complaint: Left wrist pain     History of Present Illness:    Latrisha Tonkinson is a 27 y.o. female presenting for evaluation of left wrist pain.  She states that over the last few days her wrist has been popping with movement more frequently which is created a dull, achy pain that is 6/10 today.  She has had popping in her wrist for as long as she can remember however it only became painful about 2 days ago.  Her symptoms are improved with rest and avoiding rapid wrist motion. She denies any weakness, numbness or tingling in her wrist or hand.  She has not been taking anything for pain.  Surgical History:   No prior wrist or hand surgeries  PMH/PSH/Family History/Social History/Meds/Allergies:    Past Medical History:  Diagnosis Date   Acute recurrent pansinusitis 10/21/2019   ADHD (attention deficit hyperactivity disorder)    inattentive   Anemia    had iron infusions in Fall of 2023   Anxiety    Asthma    Bronchitis    Cat allergy due to both airborne and skin contact 05/01/2016   Chronic sore throat 05/01/2016   COVID 2020   Depression    Fibromyalgia    pt denies   Gastroparesis    GERD (gastroesophageal reflux disease)    History of hypotension    taken midodrine in the past- Spring of 2022   Hypermobile Ehlers-Danlos syndrome    Irregular heart rate    Keratosis pilaris    Migraine    PONV (postoperative nausea and vomiting)    "I can get really cold"   PTSD (post-traumatic stress disorder)    Seasonal allergic rhinitis 03/11/2014   Past Surgical History:  Procedure Laterality Date   CHOLECYSTECTOMY  05/09/2021   ESOPHAGOGASTRODUODENOSCOPY ENDOSCOPY     03/2020   HIP ARTHROSCOPY Right 04/25/2022   Procedure: RIGHT HIP ARTHROSCOPY WITH LABRAL RECONSTRUCTION, ILIOTIBIAL BAND ALLOGRAFT, PSOAS RELEASE;  Surgeon: Vanetta Mulders, MD;  Location: [facility];  Service: Orthopedics;  Laterality: Right;   LABRAL REPAIR Right  03/02/2021   Procedure: RIGHT HIP ARTHROSCOPY WITH LABRAL REPAIR AND PINCE DEBRIDEMENT;  Surgeon: Vanetta Mulders, MD;  Location: [facility];  Service: Orthopedics;  Laterality: Right;   SMART PILL PROCEDURE     12/2020   TYMPANOSTOMY TUBE PLACEMENT Bilateral    WISDOM TOOTH EXTRACTION     Social History   Socioeconomic History   Marital status: Single    Spouse name: Not on file   Number of children: Not on file   Years of education: Not on file   Highest education level: Not on file  Occupational History   Not on file  Tobacco Use   Smoking status: Former    Types: E-cigarettes, Cigarettes    Quit date: 2021    Years since quitting: 3.2   Smokeless tobacco: Never  Vaping Use   Vaping Use: Former   Substances: Editor, commissioning  Substance and Sexual Activity   Alcohol use: Not Currently   Drug use: Yes    Frequency: 7.0 times per week    Types: Marijuana   Sexual activity: Yes    Birth control/protection: None  Other Topics Concern   Not on file  Social History Narrative   Right handed   Social Determinants of Health  Financial Resource Strain: Not on file  Food Insecurity: Not on file  Transportation Needs: Not on file  Physical Activity: Not on file  Stress: Not on file  Social Connections: Not on file   Family History  Problem Relation Age of Onset   Diabetes Mother    Polycystic ovary syndrome Mother    GER disease Father    Heart disease Paternal Uncle    Allergies  Allergen Reactions   Clindamycin Hives, Swelling and Rash   Tretinoin Dermatitis, Itching, Photosensitivity, Swelling and Other (See Comments)   Current Outpatient Medications  Medication Sig Dispense Refill   cyclobenzaprine (FLEXERIL) 10 MG tablet Take 1 tablet (10 mg total) by mouth 3 (three) times daily as needed for muscle spasms. 30 tablet 0   naratriptan (AMERGE) 2.5 MG tablet Take 2.5 mg by mouth as needed for migraine.     Omega-3 Fatty Acids (FISH OIL PO) Take 1,400 Units by  mouth daily. Mini     ondansetron (ZOFRAN ODT) 4 MG disintegrating tablet Take 1 tablet (4 mg total) by mouth every 8 (eight) hours as needed for nausea or vomiting. 20 tablet 5   Pediatric Multiple Vitamins (PEDIATRIC MULTIVITAMIN) chewable tablet Chew 1 tablet by mouth daily.     promethazine (PHENERGAN) 25 MG tablet Take 1 tablet (25 mg total) by mouth every 6 (six) hours as needed for nausea or vomiting. (Patient taking differently: Take 12.5 mg by mouth every 6 (six) hours as needed for nausea or vomiting.) 30 tablet 0   tazarotene (TAZORAC) 0.05 % cream Apply 1 Application topically every other day.     triamcinolone cream (KENALOG) 0.1 % Apply 1 Application topically daily as needed ([place]cz[place]).     No current facility-administered medications for this visit.   No results found.  Review of Systems:   A ROS was performed including pertinent positives and negatives as documented in the HPI.  Physical Exam :   Constitutional: NAD and appears stated age Neurological: Alert and oriented Psych: Appropriate affect and cooperative There were no vitals taken for this visit.   Comprehensive Musculoskeletal Exam:    No gross abnormality appreciated over left wrist.  No tenderness to to palpation throughout wrist joint.  Popping noted and palpable in dorsal wrist with rapid wrist flexion and extension.  Passive and active wrist range of motion to 90 degrees flexion and 20 degrees extension.  No pain with pronation or supination.  Distal neurosensory exam intact.  Full and equal grip strength bilaterally.  Imaging:   Xray (Left wrist 3 views): Negative for fracture or dislocation. No evidence of osteoarthritis.   I personally reviewed and interpreted the radiographs.   Assessment:   27 y.o. female presenting for evaluation of left wrist pain.  Overall I suspect her popping sensation and subsequent pain to be related to a tendon issue likely causing a tendinitis, possibly of the ECU tendon.   Her pain and symptoms seem well-managed with rest and avoiding rapid movements.  To this effect, I recommend beginning treatment with a wrist brace to see if this allows her symptoms to subside.  We also discussed an MR Gresser treatment option would to be perform a steroid injection in the area, however we will hold off to see if this is necessary.  She can follow-up in clinic as needed.  Plan :    -Wrist bracing and follow up in clinic as needed.    I personally saw and evaluated the patient, and participated in the management  and treatment plan.  Marnee Spring, PA-C Orthopedics  This document was dictated using Systems analyst. A reasonable attempt at proof reading has been made to minimize errors.

## 2022-08-16 NOTE — Therapy (Signed)
OUTPATIENT PHYSICAL THERAPY LOWER EXTREMITY TREATMENT   Patient Name: Brianna [name] MRN: [id number] DOB:July 29, 1995, 27 y.o., female Today's Date: 08/12/2022  END OF SESSION:  PT End of Session - 08/12/22 0947     Visit Number 25    Number of Visits 36    Date for PT Re-Evaluation 09/15/22    Authorization Type BC[employer]    Authorization Time Period FOTO v6, v10    PT Start Time [phone]    PT Stop Time 1027    PT Time Calculation (min) 40 min    Activity Tolerance Patient tolerated treatment well    Behavior During Therapy [facility] for tasks assessed/performed               Past Medical History:  Diagnosis Date   Acute recurrent pansinusitis 10/21/2019   ADHD (attention deficit hyperactivity disorder)    inattentive   Anemia    had iron infusions in Fall of 2023   Anxiety    Asthma    Bronchitis    Cat allergy due to both airborne and skin contact 05/01/2016   Chronic sore throat 05/01/2016   COVID 2020   Depression    Fibromyalgia    pt denies   Gastroparesis    GERD (gastroesophageal reflux disease)    History of hypotension    taken midodrine in the past- Spring of 2022   Hypermobile Ehlers-Danlos syndrome    Irregular heart rate    Keratosis pilaris    Migraine    PONV (postoperative nausea and vomiting)    "I can get really cold"   PTSD (post-traumatic stress disorder)    Seasonal allergic rhinitis 03/11/2014   Past Surgical History:  Procedure Laterality Date   CHOLECYSTECTOMY  05/09/2021   ESOPHAGOGASTRODUODENOSCOPY ENDOSCOPY     03/2020   HIP ARTHROSCOPY Right 04/25/2022   Procedure: RIGHT HIP ARTHROSCOPY WITH LABRAL RECONSTRUCTION, ILIOTIBIAL BAND ALLOGRAFT, PSOAS RELEASE;  Surgeon: Vanetta Mulders, MD;  Location: [place];  Service: Orthopedics;  Laterality: Right;   LABRAL REPAIR Right 03/02/2021   Procedure: RIGHT HIP ARTHROSCOPY WITH LABRAL REPAIR AND PINCE DEBRIDEMENT;  Surgeon: Vanetta Mulders, MD;  Location: [place];  Service: Orthopedics;   Laterality: Right;   SMART PILL PROCEDURE     12/2020   TYMPANOSTOMY TUBE PLACEMENT Bilateral    WISDOM TOOTH EXTRACTION     Patient Active Problem List   Diagnosis Date Noted   Cervical adenopathy 06/10/2021   Chronic pruritus 06/10/2021   Exercise-induced asthma 06/09/2021   Chronic venous insufficiency 06/03/2021   Orthostatic lightheadedness 06/03/2021   Purple toe syndrome of both feet ([place]) 06/03/2021   Autonomic dysfunction 06/03/2021   Tear of right acetabular labrum 04/13/2021   Migraine headache 03/01/2021   Headache 11/30/2020   Positive ANA (antinuclear antibody) 11/15/2020   Fibromyalgia 11/02/2020   Hair loss 11/02/2020   Muscle spasticity 10/22/2020   Hypermobile Ehlers-Danlos syndrome 10/22/2020   Paresthesia of bilateral legs 10/06/2020   Myalgia 10/06/2020   Chronic pelvic pain in female 10/06/2020   Chronic abdominal pain 10/06/2020   Irritable colon 08/02/2020   Pelvic floor dysfunction 08/02/2020   Irritant dermatitis 08/02/2020   Gastroesophageal reflux disease 06/28/2020   Severe recurrent major depression without psychotic features ([place]) 06/28/2020   Polyarthralgia 06/28/2020   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 06/28/2020   Tachycardia 06/28/2020   History of COVID-19 06/28/2020   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 deficit 06/28/2020   Recurrent major depressive disorder, in full remission ([place]) 06/28/2020  Atypical chest pain 06/22/2020   Dizziness 06/22/2020   Palpitations 06/14/2020   Anxiety 05/04/2020   Internal and external hemorrhoids without complication 123XX123   Nondiabetic gastroparesis 03/03/2020   ETD (Eustachian tube dysfunction), bilateral 10/21/2019   Acne vulgaris 03/11/2014   Mild intermittent asthma without complication 123XX123   Seasonal allergies 03/11/2014    REFERRING PROVIDER:  Vanetta Mulders, MD     REFERRING DIAG: [phone] (ICD-10-CM) - Tear of right acetabular  labrum, subsequent encounter  post op 04/25/22 R hip labral repair revision with IT band allograft   THERAPY DIAG:  Pain in right hip  Muscle weakness (generalized)  Difficulty in walking, not elsewhere classified  Rationale for Evaluation and Treatment: Rehabilitation  ONSET DATE: DOS 04/25/22  Days since surgery: 109  SUBJECTIVE:   SUBJECTIVE STATEMENT: The patient reports significant pain. The pain is still going down the front of her quad. She has increased pain sitting on the commode.  PERTINENT HISTORY: EDS PAIN:  Are you having pain? Yes: NPRS scale: 7/10 Pain location: rt hip Pain description: sore/achey, tight, clicks deep- more medial Aggravating factors: movement Relieving factors: ice  PRECAUTIONS:none  WEIGHT BEARING RESTRICTIONS: no longer has   FALLS:  Has patient fallen in last 6 months? 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  PLOF: Independent  PATIENT GOALS: yoga, take dance classes  OBJECTIVE:   PATIENT SURVEYS:  FOTO EVAL: 27 2/2: 59 2/29: 57  POSTURE:WFL on 1/19  PALPATION: 3/14- able to palpate ITB snapping over greater trochanter throughout hip flexion  LOWER EXTREMITY ROM:  Passive ROM Right eval Right 05/23/22 Rt  06/21/22 Rt AROM 2/29  Hip flexion 80 90 100 pinch  126 slight ant pinch  Hip extension 0     Hip abduction 30   40  Hip adduction      Hip internal rotation   25   Hip external rotation   45    (Blank rows = not tested) MMT(lb) Right 1/19 Left 1/19 Rt LE  06/21/22 Rt  2/29  Hip flexion 6.4 24.8    Hip extension   Can do single leg bridge 10 sec hold    Hip abduction 22.6 30.8 4+/5 31.5  Hip adduction      Hip internal rotation      Hip external rotation      Knee flexion 11.0 20.4  18.2  Knee extension 34.2 33.0  33.0   (Blank rows = not tested)   GAIT: EVAL: TDWB with bil axillary crutches 05/23/22: able to ambulate without AD, locked in neutral for hip extension 06/09/22: gait pattern without antalgia,  good ROM and heel strike, no trendelenburg noted   TODAY'S TREATMENT:  3/27 Manual: Side lying anterior hip stretching; Inferior glide Grade 1 to start progressing to grade II. Roller to anterior hip   Glut set x10  Quad set x10  Towel assisted hip flexion in pain free range 5x in pain free range   [facility] Adult PT Treatment:                                                DATE: 08/12/22 Therapeutic Exercise: Sidelying clamshell x10 Supine hamstring stretch 3x30' Rt Manual Therapy: IASTM Rt ITB, quads, HS, TFL, proximal hip flexor tendons, hamstrings, calf (discussion of TPDN to hamstrings, pt familiar with technique)       Treatment  3/19:  Negative grind test Stretchy end feel with pain and reported pinching- anterior with flexion, post with ER Denies pain with passive extension but reports ant pinching with post to ant mobilizations Palpation to obturator creates concordant pain wrapping around proximal femur.  No pain with sacrotuberal ligament palpation Denies pain with SIJ spring testing All musculature activating to lift leg against gravity Reports pain but not concordant upon palpation to greater trochanter and ischial tuberosity Tightness in biceps femoris that pt reports feels similar to her pain MANUAL STM to obturator Negative slump Concordant pain upon palpation to meeting point of TFL and ITB  Denies fevers, chills. Usually has night sweats, feels pretty sniffly, stopped taking ati-depressant on Friday.  Took ibuprofen which I think helped, ice helps when covering a large surface areas Antalgic gait pattern- does present hip extension at toe off    [facility] Adult PT Treatment:                                                DATE: 08/05/22 Therapeutic Exercise: Sidelying clamshell x20 Sidelying clamshell YTB 2x10 Sidelying hip abduction with extension bias 2x10 Prone hip extension 2x10 Manual Therapy: IASTM Rt ITB, quads, HS, TFL, proximal hip  flexor tendons Passive flexion in sidelying for quads & ITB/TFL IASTM proximal hip flexor tendons    PATIENT EDUCATION:  Education details: Anatomy of condition, POC, HEP, exercise form/rationale Person educated: Patient and Parent Education method: Explanation, Demonstration, Tactile cues, Verbal cues, and Handouts Education comprehension: verbalized understanding, returned demonstration, verbal cues required, tactile cues required, and needs further education  HOME EXERCISE PROGRAM:  Access Code: [email or web address] URL: [email or web address]   ASSESSMENT:  CLINICAL IMPRESSION: The patient came in today with a significant hip flexion, ER and IR limitation. We focesd on manual stretching and light joint mobilization. By the end of the session she had about 85-90 degrees of flexion She was advised to keep her chair a higher at work. Her hip is acutely inflamed at this time. She was advised to avoid positions that are irritating. She has been having sensitivity in her foot. She has been bending a lot to adjust her shoe which is likely irritating. She had been doing a hamstring stretch. She was advised to old for now on anything pulling her into flexion. She was given a controled hip flexion stretch at home. She was advised to bring it into pain free ranges to maintain gains today. She was also advised to just do quad, glut and ankle sets. We will try to reduce acute inflammation before progressing back into regular activity.   OBJECTIVE IMPAIRMENTS: Abnormal gait, decreased activity tolerance, decreased balance, difficulty walking, decreased ROM, decreased strength, increased muscle spasms, impaired flexibility, improper body mechanics, and pain.   ACTIVITY LIMITATIONS: carrying, lifting, bending, sitting, standing, squatting, stairs, transfers, bed mobility, bathing, dressing, and locomotion level  PARTICIPATION LIMITATIONS: meal prep, cleaning, medication management, driving, shopping,  community activity, and occupation  PERSONAL FACTORS: 3+ comorbidities: previous labral repair, EDS, autonomic dysfunction  are also affecting patient's functional outcome.     GOALS: Goals reviewed with patient? Yes  SHORT TERM GOALS: Target date: 05/26/22 30s STS without pain Baseline: x12 Goal status: achieved  2.  Able to demo household ambulation without AD and with proper pattern Baseline:  Goal status: achieved    LONG TERM GOALS: Target date: POC date  Able to demo step up on 6" step with level pelvis and proper form Baseline:  Goal status: 06/20/22  2.  Will tolerate at least 3 min on elliptical, demonstrating good tolerance to repetitive weight bearing motion Baseline:  Goal status: achieved Week 8 06/20/22  3.  Demonstrate proper form in at least 10 continuous lunges without increased pain Baseline:  Goal status: ongoing  4.  Demonstrate gentle, double and single foot plyometric motions with good proximal form Baseline:  Goal status: ongoing  5.  Pt will begin gradual return to group fitness with understanding of modifications and precautions Baseline:  Goal status: ongoing    PLAN:  PT FREQUENCY: 1-2x/week  PT DURATION: 12 weeks  PLANNED INTERVENTIONS: Therapeutic exercises, Therapeutic activity, Neuromuscular re-education, Balance training, Gait training, Patient/Family education, Self Care, Joint mobilization, Stair training, Aquatic Therapy, Dry Needling, Electrical stimulation, Spinal mobilization, Cryotherapy, Moist heat, Taping, Traction, Ultrasound, Ionotophoresis 4mg /ml Dexamethasone, and Manual therapy  PLAN FOR NEXT SESSION: STM to hip musculature PROM with mobs as tolerated.   Carolyne Littles PT DPT  08/12/22 9:48 AM

## 2022-08-17 ENCOUNTER — Encounter: Payer: Self-pay | Admitting: Sports Medicine

## 2022-08-17 ENCOUNTER — Ambulatory Visit (INDEPENDENT_AMBULATORY_CARE_PROVIDER_SITE_OTHER): Payer: BC Managed Care – PPO | Admitting: Sports Medicine

## 2022-08-17 DIAGNOSIS — L659 Nonscarring hair loss, unspecified: Secondary | ICD-10-CM | POA: Diagnosis not present

## 2022-08-17 DIAGNOSIS — S73191D Other sprain of right hip, subsequent encounter: Secondary | ICD-10-CM

## 2022-08-17 DIAGNOSIS — M25551 Pain in right hip: Secondary | ICD-10-CM | POA: Diagnosis not present

## 2022-08-17 NOTE — Progress Notes (Signed)
Brianna [name] - 28 y.o. female MRN [id number]  Date of birth: August 14, 1995  Office Visit Note: Visit Date: 08/17/2022 PCP: Josephina Shih, MD Referred by: Josephina Shih,*  Subjective: No chief complaint on file.  HPI: Brianna [name] is a pleasant 27 y.o. female who presents today for follow-up of right hip pain status post labral reconstruction on 04/25/2022.  Last week we did do a trial of extracorporeal shockwave therapy over the anterior hip to help break down scar tissue as well as over the ischial tuberosity and proximal hamstring origin due to tightness.  She does feel like she had some improvement from this.  She had 2-3 days of soreness and some mild bruising, but following this she feels like she is about 25% improved from previous treatments.  She had to discontinue her prednisone that was prescribed by Dr. Sammuel Hines due to GI effects.  Pertinent ROS were reviewed with the patient and found to be negative unless otherwise specified above in HPI.   Assessment & Plan: Visit Diagnoses:  1. Pain in right hip   2. Tear of right acetabular labrum, subsequent encounter   3. Hair loss    Plan: Brianna [name] did note some improvement with the first trial of extracorporeal shockwave treatment.  Through shared decision-making, they elected to proceed with a repeat treatment today to help break down the scar tissue over the anterior lateral hip as well as treat her tight hamstring musculature.  She will continue her formalized physical therapy, may continue home exercise and hamstring stretching at home.  She would like to continue evaluation and further treatment, we will have her follow-up in 1 week.  May use over-the-counter anti-inflammatories for any soreness.  She did also mention some hair loss issues, I did send a message to her with info with a functional medicine doctor per her request.  Follow-up: Return in about 1 week (around 08/24/2022) for for right hip pain.    Meds & Orders: No orders of the defined types were placed in this encounter.  No orders of the defined types were placed in this encounter.    Procedures: Procedure Details Consent: Risks of procedure as well as the alternatives and risks of each were explained to the patient.  Verbal consent for procedure obtained. Time Out: Verified patient identification, verified procedure, site was marked, verified correct patient position. The area was cleaned with alcohol swab.     The right ischial tuberosity and proximal hamstring and lateral gluteal muscles were targeted for Extracorporeal shockwave therapy.    Preset: status post muscular injury Power Level: 90 mJ Frequency: 10 Hz Impulse/cycles: 2000 Head size: Regular   The right anterior hip and proximal quadriceps was targeted for Extracorporeal shockwave therapy.    Preset: status post muscular injury Power Level: 60 mJ Frequency: 10 Hz Impulse/cycles: 1800 Head size: Regular   Patient tolerated procedure well without immediate complications.      Clinical History: No specialty comments available.  She reports that she quit smoking about 3 years ago. Her smoking use included e-cigarettes and cigarettes. She has never used smokeless tobacco. No results for input(s): "HGBA1C", "LABURIC" in the last 8760 hours.  Objective:   Vital Signs: There were no vitals taken for this visit.  Physical Exam  Gen: Well-appearing, in no acute distress; non-toxic CV: Regular Rate. Well-perfused. Warm.  Resp: Breathing unlabored on room air; no wheezing. Psych: Fluid speech in conversation; appropriate affect; normal thought process Neuro: Sensation intact throughout. No gross coordination deficits.  Ortho Exam -  Right hip: Well healed arthroscopic incisions on anterior hip. Fluid internal/external rotation. + TTP at ischial tuberosity and proximal hamstrings. Able to perform full range hamstring curl. No redness, swelling. NVI.    Imaging: No results found.  Past Medical/Family/Surgical/Social History: Medications & Allergies reviewed per EMR, new medications updated. Patient Active Problem List   Diagnosis Date Noted   Cervical adenopathy 06/10/2021   Chronic pruritus 06/10/2021   Exercise-induced asthma 06/09/2021   Chronic venous insufficiency 06/03/2021   Orthostatic lightheadedness 06/03/2021   Purple toe syndrome of both feet ([place]) 06/03/2021   Autonomic dysfunction 06/03/2021   Tear of right acetabular labrum 04/13/2021   Migraine headache 03/01/2021   Headache 11/30/2020   Positive ANA (antinuclear antibody) 11/15/2020   Fibromyalgia 11/02/2020   Hair loss 11/02/2020   Muscle spasticity 10/22/2020   Hypermobile Ehlers-Danlos syndrome 10/22/2020   Paresthesia of bilateral legs 10/06/2020   Myalgia 10/06/2020   Chronic pelvic pain in female 10/06/2020   Chronic abdominal pain 10/06/2020   Irritable colon 08/02/2020   Pelvic floor dysfunction 08/02/2020   Irritant dermatitis 08/02/2020   Gastroesophageal reflux disease 06/28/2020   Severe recurrent major depression without psychotic features ([place]) 06/28/2020   Polyarthralgia 06/28/2020   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 06/28/2020   Tachycardia 06/28/2020   History of COVID-19 06/28/2020   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 deficit 06/28/2020   Recurrent major depressive disorder, in full remission ([place]) 06/28/2020   Atypical chest pain 06/22/2020   Dizziness 06/22/2020   Palpitations 06/14/2020   Anxiety 05/04/2020   Internal and external hemorrhoids without complication 123XX123   Nondiabetic gastroparesis 03/03/2020   ETD (Eustachian tube dysfunction), bilateral 10/21/2019   Acne vulgaris 03/11/2014   Mild intermittent asthma without complication [id number]   Seasonal allergies 03/11/2014   Past Medical History:  Diagnosis Date   Acute recurrent pansinusitis 10/21/2019   ADHD (attention  deficit hyperactivity disorder)    inattentive   Anemia    had iron infusions in Fall of 2023   Anxiety    Asthma    Bronchitis    Cat allergy due to both airborne and skin contact 05/01/2016   Chronic sore throat 05/01/2016   COVID 2020   Depression    Fibromyalgia    pt denies   Gastroparesis    GERD (gastroesophageal reflux disease)    History of hypotension    taken midodrine in the past- Spring of 2022   Hypermobile Ehlers-Danlos syndrome    Irregular heart rate    Keratosis pilaris    Migraine    PONV (postoperative nausea and vomiting)    "I can get really cold"   PTSD (post-traumatic stress disorder)    Seasonal allergic rhinitis 03/11/2014   Family History  Problem Relation Age of Onset   Diabetes Mother    Polycystic ovary syndrome Mother    GER disease Father    Heart disease Paternal Uncle    Past Surgical History:  Procedure Laterality Date   CHOLECYSTECTOMY  05/09/2021   ESOPHAGOGASTRODUODENOSCOPY ENDOSCOPY     03/2020   HIP ARTHROSCOPY Right 04/25/2022   Procedure: RIGHT HIP ARTHROSCOPY WITH LABRAL RECONSTRUCTION, ILIOTIBIAL BAND ALLOGRAFT, PSOAS RELEASE;  Surgeon: Vanetta Mulders, MD;  Location: [place];  Service: Orthopedics;  Laterality: Right;   LABRAL REPAIR Right 03/02/2021   Procedure: RIGHT HIP ARTHROSCOPY WITH LABRAL REPAIR AND PINCE DEBRIDEMENT;  Surgeon: Vanetta Mulders, MD;  Location: [place];  Service: Orthopedics;  Laterality: Right;   SMART PILL  PROCEDURE     12/2020   TYMPANOSTOMY TUBE PLACEMENT Bilateral    WISDOM TOOTH EXTRACTION     Social History   Occupational History   Not on file  Tobacco Use   Smoking status: Former    Types: E-cigarettes, Cigarettes    Quit date: 2021    Years since quitting: 3.2   Smokeless tobacco: Never  Vaping Use   Vaping Use: Former   Substances: Editor, commissioning  Substance and Sexual Activity   Alcohol use: Not Currently   Drug use: Yes    Frequency: 7.0 times per week    Types: Marijuana    Sexual activity: Yes    Birth control/protection: None

## 2022-08-22 ENCOUNTER — Ambulatory Visit (HOSPITAL_BASED_OUTPATIENT_CLINIC_OR_DEPARTMENT_OTHER): Payer: BC Managed Care – PPO | Attending: Orthopaedic Surgery | Admitting: Physical Therapy

## 2022-08-22 ENCOUNTER — Encounter (HOSPITAL_BASED_OUTPATIENT_CLINIC_OR_DEPARTMENT_OTHER): Payer: Self-pay | Admitting: Physical Therapy

## 2022-08-22 DIAGNOSIS — M25551 Pain in right hip: Secondary | ICD-10-CM | POA: Diagnosis present

## 2022-08-22 DIAGNOSIS — M6281 Muscle weakness (generalized): Secondary | ICD-10-CM | POA: Diagnosis present

## 2022-08-22 DIAGNOSIS — R262 Difficulty in walking, not elsewhere classified: Secondary | ICD-10-CM | POA: Insufficient documentation

## 2022-08-22 NOTE — Therapy (Signed)
OUTPATIENT PHYSICAL THERAPY LOWER EXTREMITY TREATMENT   Patient Name: Brianna [name] MRN: [id number] DOB:1996-05-20, 27 y.o., female Today's Date: 08/22/2022  END OF SESSION:  PT End of Session - 08/22/22 1551     Visit Number 27    Number of Visits 36    Date for PT Re-Evaluation 09/15/22    PT Start Time 1519    PT Stop Time 1600    PT Time Calculation (min) 41 min    Activity Tolerance Patient tolerated treatment well    Behavior During Therapy [facility] for tasks assessed/performed               Past Medical History:  Diagnosis Date   Acute recurrent pansinusitis 10/21/2019   ADHD (attention deficit hyperactivity disorder)    inattentive   Anemia    had iron infusions in Fall of 2023   Anxiety    Asthma    Bronchitis    Cat allergy due to both airborne and skin contact 05/01/2016   Chronic sore throat 05/01/2016   COVID 2020   Depression    Fibromyalgia    pt denies   Gastroparesis    GERD (gastroesophageal reflux disease)    History of hypotension    taken midodrine in the past- Spring of 2022   Hypermobile Ehlers-Danlos syndrome    Irregular heart rate    Keratosis pilaris    Migraine    PONV (postoperative nausea and vomiting)    "I can get really cold"   PTSD (post-traumatic stress disorder)    Seasonal allergic rhinitis 03/11/2014   Past Surgical History:  Procedure Laterality Date   CHOLECYSTECTOMY  05/09/2021   ESOPHAGOGASTRODUODENOSCOPY ENDOSCOPY     03/2020   HIP ARTHROSCOPY Right 04/25/2022   Procedure: RIGHT HIP ARTHROSCOPY WITH LABRAL RECONSTRUCTION, ILIOTIBIAL BAND ALLOGRAFT, PSOAS RELEASE;  Surgeon: Vanetta Mulders, MD;  Location: [place];  Service: Orthopedics;  Laterality: Right;   LABRAL REPAIR Right 03/02/2021   Procedure: RIGHT HIP ARTHROSCOPY WITH LABRAL REPAIR AND PINCE DEBRIDEMENT;  Surgeon: Vanetta Mulders, MD;  Location: [place];  Service: Orthopedics;  Laterality: Right;   SMART PILL PROCEDURE     12/2020   TYMPANOSTOMY TUBE  PLACEMENT Bilateral    WISDOM TOOTH EXTRACTION     Patient Active Problem List   Diagnosis Date Noted   Cervical adenopathy 06/10/2021   Chronic pruritus 06/10/2021   Exercise-induced asthma 06/09/2021   Chronic venous insufficiency 06/03/2021   Orthostatic lightheadedness 06/03/2021   Purple toe syndrome of both feet 06/03/2021   Autonomic dysfunction 06/03/2021   Tear of right acetabular labrum 04/13/2021   Migraine headache 03/01/2021   Headache 11/30/2020   Positive ANA (antinuclear antibody) 11/15/2020   Fibromyalgia 11/02/2020   Hair loss 11/02/2020   Muscle spasticity 10/22/2020   Hypermobile Ehlers-Danlos syndrome 10/22/2020   Paresthesia of bilateral legs 10/06/2020   Myalgia 10/06/2020   Chronic pelvic pain in female 10/06/2020   Chronic abdominal pain 10/06/2020   Irritable colon 08/02/2020   Pelvic floor dysfunction 08/02/2020   Irritant dermatitis 08/02/2020   Gastroesophageal reflux disease 06/28/2020   Severe recurrent major depression without psychotic features 06/28/2020   Polyarthralgia 06/28/2020   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 06/28/2020   Tachycardia 06/28/2020   History of COVID-19 06/28/2020   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 deficit 06/28/2020   Recurrent major depressive disorder, in full remission 06/28/2020   Atypical chest pain 06/22/2020   Dizziness 06/22/2020   Palpitations 06/14/2020   Anxiety 05/04/2020  Internal and external hemorrhoids without complication [id number]   Nondiabetic gastroparesis 03/03/2020   ETD (Eustachian tube dysfunction), bilateral 10/21/2019   Acne vulgaris 03/11/2014   Mild intermittent asthma without complication [id number]   Seasonal allergies 03/11/2014    REFERRING PROVIDER:  Vanetta Mulders, MD     REFERRING DIAG: [phone] (ICD-10-CM) - Tear of right acetabular labrum, subsequent encounter  post op 04/25/22 R hip labral repair revision with IT band allograft    THERAPY DIAG:  Pain in right hip  Muscle weakness (generalized)  Difficulty in walking, not elsewhere classified  Rationale for Evaluation and Treatment: Rehabilitation  ONSET DATE: DOS 04/25/22  Days since surgery: 119  SUBJECTIVE:   SUBJECTIVE STATEMENT:  The patient reported that she is better in some ways and worse than others.  She reports less pain overall when she is walking.  She reports she is not having pain with every step anymore.  She is does report she continues to be very tight in hip flexion.  PERTINENT HISTORY: EDS PAIN:  Are you having pain? Yes: NPRS scale: 7/10 Pain location: rt hip Pain description: sore/achey, tight, clicks deep- more medial Aggravating factors: movement Relieving factors: ice  PRECAUTIONS:none  WEIGHT BEARING RESTRICTIONS: no longer has   FALLS:  Has patient fallen in last 6 months? 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  PLOF: Independent  PATIENT GOALS: yoga, take dance classes  OBJECTIVE:   PATIENT SURVEYS:  FOTO EVAL: 27 2/2: 59 2/29: 57  POSTURE:WFL on 1/19  PALPATION: 3/14- able to palpate ITB snapping over greater trochanter throughout hip flexion  LOWER EXTREMITY ROM:  Passive ROM Right eval Right 05/23/22 Rt  06/21/22 Rt AROM 2/29  Hip flexion 80 90 100 pinch  126 slight ant pinch  Hip extension 0     Hip abduction 30   40  Hip adduction      Hip internal rotation   25   Hip external rotation   45    (Blank rows = not tested) MMT(lb) Right 1/19 Left 1/19 Rt LE  06/21/22 Rt  2/29  Hip flexion 6.4 24.8    Hip extension   Can do single leg bridge 10 sec hold    Hip abduction 22.6 30.8 4+/5 31.5  Hip adduction      Hip internal rotation      Hip external rotation      Knee flexion 11.0 20.4  18.2  Knee extension 34.2 33.0  33.0   (Blank rows = not tested)   GAIT: EVAL: TDWB with bil axillary crutches 05/23/22: able to ambulate without AD, locked in neutral for hip extension 06/09/22: gait  pattern without antalgia, good ROM and heel strike, no trendelenburg noted   TODAY'S TREATMENT:  4/2 Manual: Side lying anterior hip stretching; Inferior glide Grade 1 to start progressing to grade II. Roller to anterior hip  Exercise bike 4 minutes.  Patient reports that whenever she rides a bike she has increased pelvic pain in general.  Show exercise bike stop.  No increase anterior hip pain with exercise bike  Posterior pelvic tilt 2 x 10 Hip abduction with band 2 x 10 red band Reviewed bridge for HEP  Long arc quad with red band 2 x 10 each leg  Reviewed patient's HEP see below and clinical impression statement    3/27 Manual: Side lying anterior hip stretching; Inferior glide Grade 1 to start progressing to grade II. Roller to anterior hip   Glut set x10  Quad set  x10  Towel assisted hip flexion in pain free range 5x in pain free range   [facility] Adult PT Treatment:                                                DATE: 08/12/22 Therapeutic Exercise: Sidelying clamshell x10 Supine hamstring stretch 3x30' Rt Manual Therapy: IASTM Rt ITB, quads, HS, TFL, proximal hip flexor tendons, hamstrings, calf (discussion of TPDN to hamstrings, pt familiar with technique)       Treatment                            3/19:  Negative grind test Stretchy end feel with pain and reported pinching- anterior with flexion, post with ER Denies pain with passive extension but reports ant pinching with post to ant mobilizations Palpation to obturator creates concordant pain wrapping around proximal femur.  No pain with sacrotuberal ligament palpation Denies pain with SIJ spring testing All musculature activating to lift leg against gravity Reports pain but not concordant upon palpation to greater trochanter and ischial tuberosity Tightness in biceps femoris that pt reports feels similar to her pain MANUAL STM to obturator Negative slump Concordant pain upon palpation to meeting point of TFL and  ITB  Denies fevers, chills. Usually has night sweats, feels pretty sniffly, stopped taking ati-depressant on Friday.  Took ibuprofen which I think helped, ice helps when covering a large surface areas Antalgic gait pattern- does present hip extension at toe off    [facility] Adult PT Treatment:                                                DATE: 08/05/22 Therapeutic Exercise: Sidelying clamshell x20 Sidelying clamshell YTB 2x10 Sidelying hip abduction with extension bias 2x10 Prone hip extension 2x10 Manual Therapy: IASTM Rt ITB, quads, HS, TFL, proximal hip flexor tendons Passive flexion in sidelying for quads & ITB/TFL IASTM proximal hip flexor tendons    PATIENT EDUCATION:  Education details: Anatomy of condition, POC, HEP, exercise form/rationale Person educated: Patient and Parent Education method: Explanation, Demonstration, Tactile cues, Verbal cues, and Handouts Education comprehension: verbalized understanding, returned demonstration, verbal cues required, tactile cues required, and needs further education  HOME EXERCISE PROGRAM:  Access Code: [email or web address] URL: [email or web address]   ASSESSMENT:  CLINICAL IMPRESSION: The patient's hip flexion has improved since last visit.  At the beginning of the session she was able to get to 90 degrees relatively pain-free.  She has been working on maintaining her hip flexion at home.  Therapy reviewed her HEP.  Majority of her exercises involved hip flexion.  She was advised at this time to hold until her hip flexion pain improved.  She was given base exercises to work on at home.  She will continue working on light stretching and basic exercises until she is seen again by physical therapy.  Overall she has progressed since last visit. OBJECTIVE IMPAIRMENTS: Abnormal gait, decreased activity tolerance, decreased balance, difficulty walking, decreased ROM, decreased strength, increased muscle spasms, impaired flexibility, improper  body mechanics, and pain.   ACTIVITY LIMITATIONS: carrying, lifting, bending, sitting, standing, squatting, stairs, transfers, bed mobility, bathing, dressing, and locomotion level  PARTICIPATION LIMITATIONS:  meal prep, cleaning, medication management, driving, shopping, community activity, and occupation  PERSONAL FACTORS: 3+ comorbidities: previous labral repair, EDS, autonomic dysfunction  are also affecting patient's functional outcome.     GOALS: Goals reviewed with patient? Yes  SHORT TERM GOALS: Target date: 05/26/22 30s STS without pain Baseline: x12 Goal status: achieved  2.  Able to demo household ambulation without AD and with proper pattern Baseline:  Goal status: achieved    LONG TERM GOALS: Target date: POC date  Able to demo step up on 6" step with level pelvis and proper form Baseline:  Goal status: 06/20/22  2.  Will tolerate at least 3 min on elliptical, demonstrating good tolerance to repetitive weight bearing motion Baseline:  Goal status: achieved Week 8 06/20/22  3.  Demonstrate proper form in at least 10 continuous lunges without increased pain Baseline:  Goal status: ongoing  4.  Demonstrate gentle, double and single foot plyometric motions with good proximal form Baseline:  Goal status: ongoing  5.  Pt will begin gradual return to group fitness with understanding of modifications and precautions Baseline:  Goal status: ongoing    PLAN:  PT FREQUENCY: 1-2x/week  PT DURATION: 12 weeks  PLANNED INTERVENTIONS: Therapeutic exercises, Therapeutic activity, Neuromuscular re-education, Balance training, Gait training, Patient/Family education, Self Care, Joint mobilization, Stair training, Aquatic Therapy, Dry Needling, Electrical stimulation, Spinal mobilization, Cryotherapy, Moist heat, Taping, Traction, Ultrasound, Ionotophoresis 4mg /ml Dexamethasone, and Manual therapy  PLAN FOR NEXT SESSION: STM to hip musculature PROM with mobs as tolerated.    Carolyne Littles PT DPT  08/22/22 8:36 PM

## 2022-08-24 ENCOUNTER — Ambulatory Visit (HOSPITAL_BASED_OUTPATIENT_CLINIC_OR_DEPARTMENT_OTHER): Payer: BC Managed Care – PPO | Admitting: Physical Therapy

## 2022-08-24 DIAGNOSIS — M25551 Pain in right hip: Secondary | ICD-10-CM | POA: Diagnosis not present

## 2022-08-24 DIAGNOSIS — M7918 Myalgia, other site: Secondary | ICD-10-CM

## 2022-08-24 DIAGNOSIS — R262 Difficulty in walking, not elsewhere classified: Secondary | ICD-10-CM

## 2022-08-24 DIAGNOSIS — M6281 Muscle weakness (generalized): Secondary | ICD-10-CM

## 2022-08-24 DIAGNOSIS — M24159 Other articular cartilage disorders, unspecified hip: Secondary | ICD-10-CM

## 2022-08-25 ENCOUNTER — Ambulatory Visit (INDEPENDENT_AMBULATORY_CARE_PROVIDER_SITE_OTHER): Payer: BC Managed Care – PPO | Admitting: Sports Medicine

## 2022-08-25 ENCOUNTER — Encounter (HOSPITAL_BASED_OUTPATIENT_CLINIC_OR_DEPARTMENT_OTHER): Payer: Self-pay | Admitting: Physical Therapy

## 2022-08-25 ENCOUNTER — Encounter: Payer: Self-pay | Admitting: Sports Medicine

## 2022-08-25 DIAGNOSIS — S73191D Other sprain of right hip, subsequent encounter: Secondary | ICD-10-CM

## 2022-08-25 DIAGNOSIS — M25551 Pain in right hip: Secondary | ICD-10-CM | POA: Diagnosis not present

## 2022-08-25 NOTE — Progress Notes (Signed)
Brianna [name] - 27 y.o. female MRN [id number]  Date of birth: 14-Feb-1996  Office Visit Note: Visit Date: 08/25/2022 PCP: Cathe MonsViole, Nicholas Steven, MD Referred by: Cathe MonsViole, Nicholas Steven,*  Subjective: Chief Complaint  Patient presents with   Right Hip - Pain   HPI: Brianna [name] is a pleasant 27 y.o. female who presents today for follow-up of right hip pain s/p labral reconstruction on 04/25/22.  Brianna [name] is doing okay, but is somewhat frustrated due to her residual tightness and pain about the hip.  After our first session of shockwave therapy, she was about 25% improved, however after her last 1 she was unsure if this was helpful for her.  Her pain continues to wax and wane and she is still having tightness.  Has pulled back on strengthening and physical therapy which she feels is helpful.  Pertinent ROS were reviewed with the patient and found to be negative unless otherwise specified above in HPI.   Assessment & Plan: Visit Diagnoses:  1. Tear of right acetabular labrum, subsequent encounter   2. Pain in right hip    Plan: Brianna [name] is dealing with some of the sequelae from her labral reconstruction, more so with muscular hypertonicity and tightness.  I do think that some of her pain still stems from her Ehlers-Danlos syndrome.  We did proceed with 1 additional extracorporeal shockwave treatment therapy, we will hold on further treatments for now.  I would like her to take a break for about 2-3 weeks to then see if this was of benefit or not.  She has an upcoming appointment with Dr. Steward DroneBokshan around that time.  Would like her to follow his instruction and physical therapy instruction, although I do think it may be beneficial to take a brief 10-day hiatus from physical therapy and stop strengthening and work more on just gentle stretching.  May use Tylenol for any postprocedural pain.  I am happy to see her back as needed.  Follow-up: Return if symptoms worsen or fail to  improve.   Meds & Orders: No orders of the defined types were placed in this encounter.  No orders of the defined types were placed in this encounter.    Procedures: Procedure Details Consent: Risks of procedure as well as the alternatives and risks of each were explained to the patient.  Verbal consent for procedure obtained. Time Out: Verified patient identification, verified procedure, site was marked, verified correct patient position. The area was cleaned with alcohol swab.     The right ischial tuberosity and proximal hamstring and lateral gluteal muscles were targeted for Extracorporeal shockwave therapy.    Preset: status post muscular injury Power Level: 90 mJ Frequency: 10 Hz Impulse/cycles: 2000 Head size: Regular   The right anterior hip and proximal quadriceps was targeted for Extracorporeal shockwave therapy.    Preset: status post muscular injury Power Level: 80 mJ Frequency: 10 Hz Impulse/cycles: 1800 Head size: Regular   Patient tolerated procedure well without immediate complications.       Clinical History: No specialty comments available.  She reports that she quit smoking about 3 years ago. Her smoking use included e-cigarettes and cigarettes. She has never used smokeless tobacco. No results for input(s): "HGBA1C", "LABURIC" in the last 8760 hours.  Objective:   Vital Signs: There were no vitals taken for this visit.  Physical Exam  Gen: Well-appearing, in no acute distress; non-toxic CV: Regular Rate. Well-perfused. Warm.  Resp: Breathing unlabored on room air; no wheezing. Psych: Fluid speech in  conversation; appropriate affect; normal thought process Neuro: Sensation intact throughout. No gross coordination deficits.   Ortho Exam - Right hip: Well healed arthroscopic incisions on anterior hip. Fluid internal/external rotation. + TTP moreso over greater trochanter, no true TTP over the ischial tuberosity today.  Able to perform full range hamstring  curl. No redness, swelling. NVI.   Imaging: No results found.  Past Medical/Family/Surgical/Social History: Medications & Allergies reviewed per EMR, new medications updated. Patient Active Problem List   Diagnosis Date Noted   Cervical adenopathy 06/10/2021   Chronic pruritus 06/10/2021   Exercise-induced asthma 06/09/2021   Chronic venous insufficiency 06/03/2021   Orthostatic lightheadedness 06/03/2021   Purple toe syndrome of both feet 06/03/2021   Autonomic dysfunction 06/03/2021   Tear of right acetabular labrum 04/13/2021   Migraine headache 03/01/2021   Headache 11/30/2020   Positive ANA (antinuclear antibody) 11/15/2020   Fibromyalgia 11/02/2020   Hair loss 11/02/2020   Muscle spasticity 10/22/2020   Hypermobile Ehlers-Danlos syndrome 10/22/2020   Paresthesia of bilateral legs 10/06/2020   Myalgia 10/06/2020   Chronic pelvic pain in female 10/06/2020   Chronic abdominal pain 10/06/2020   Irritable colon 08/02/2020   Pelvic floor dysfunction 08/02/2020   Irritant dermatitis 08/02/2020   Gastroesophageal reflux disease 06/28/2020   Severe recurrent major depression without psychotic features 06/28/2020   Polyarthralgia 06/28/2020   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 06/28/2020   Tachycardia 06/28/2020   History of COVID-19 06/28/2020   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 deficit 06/28/2020   Recurrent major depressive disorder, in full remission 06/28/2020   Atypical chest pain 06/22/2020   Dizziness 06/22/2020   Palpitations 06/14/2020   Anxiety 05/04/2020   Internal and external hemorrhoids without complication 04/29/2020   Nondiabetic gastroparesis 03/03/2020   ETD (Eustachian tube dysfunction), bilateral 10/21/2019   Acne vulgaris 03/11/2014   Mild intermittent asthma without complication 03/11/2014   Seasonal allergies 03/11/2014   Past Medical History:  Diagnosis Date   Acute recurrent pansinusitis 10/21/2019    ADHD (attention deficit hyperactivity disorder)    inattentive   Anemia    had iron infusions in Fall of 2023   Anxiety    Asthma    Bronchitis    Cat allergy due to both airborne and skin contact 05/01/2016   Chronic sore throat 05/01/2016   COVID 2020   Depression    Fibromyalgia    pt denies   Gastroparesis    GERD (gastroesophageal reflux disease)    History of hypotension    taken midodrine in the past- Spring of 2022   Hypermobile Ehlers-Danlos syndrome    Irregular heart rate    Keratosis pilaris    Migraine    PONV (postoperative nausea and vomiting)    "I can get really cold"   PTSD (post-traumatic stress disorder)    Seasonal allergic rhinitis 03/11/2014   Family History  Problem Relation Age of Onset   Diabetes Mother    Polycystic ovary syndrome Mother    GER disease Father    Heart disease Paternal Uncle    Past Surgical History:  Procedure Laterality Date   CHOLECYSTECTOMY  05/09/2021   ESOPHAGOGASTRODUODENOSCOPY ENDOSCOPY     03/2020   HIP ARTHROSCOPY Right 04/25/2022   Procedure: RIGHT HIP ARTHROSCOPY WITH LABRAL RECONSTRUCTION, ILIOTIBIAL BAND ALLOGRAFT, PSOAS RELEASE;  Surgeon: Huel CoteBokshan, Steven, MD;  Location: [facility];  Service: Orthopedics;  Laterality: Right;   LABRAL REPAIR Right 03/02/2021   Procedure: RIGHT HIP ARTHROSCOPY WITH LABRAL REPAIR AND PINCE DEBRIDEMENT;  Surgeon: [name], MD;  Location: [facility];  Service: Orthopedics;  Laterality: Right;   SMART PILL PROCEDURE     12/2020   TYMPANOSTOMY TUBE PLACEMENT Bilateral    WISDOM TOOTH EXTRACTION     Social History   Occupational History   Not on file  Tobacco Use   Smoking status: Former    Types: E-cigarettes, Cigarettes    Quit date: 2021    Years since quitting: 3.2   Smokeless tobacco: Never  Vaping Use   Vaping Use: Former   Substances: Financial trader  Substance and Sexual Activity   Alcohol use: Not Currently   Drug use: Yes    Frequency: 7.0 times per week     Types: Marijuana   Sexual activity: Yes    Birth control/protection: None

## 2022-08-25 NOTE — Progress Notes (Signed)
Still is really tight. Hasn't noticed much change with the shockwave

## 2022-08-25 NOTE — Therapy (Signed)
OUTPATIENT PHYSICAL THERAPY LOWER EXTREMITY TREATMENT   Patient Name: Brianna [name] MRN: [id number] DOB:1996-05-20, 27 y.o., female Today's Date: 08/22/2022  END OF SESSION:  PT End of Session - 08/22/22 1551     Visit Number 27    Number of Visits 36    Date for PT Re-Evaluation 09/15/22    PT Start Time 1519    PT Stop Time 1600    PT Time Calculation (min) 41 min    Activity Tolerance Patient tolerated treatment well    Behavior During Therapy [facility] for tasks assessed/performed               Past Medical History:  Diagnosis Date   Acute recurrent pansinusitis 10/21/2019   ADHD (attention deficit hyperactivity disorder)    inattentive   Anemia    had iron infusions in Fall of 2023   Anxiety    Asthma    Bronchitis    Cat allergy due to both airborne and skin contact 05/01/2016   Chronic sore throat 05/01/2016   COVID 2020   Depression    Fibromyalgia    pt denies   Gastroparesis    GERD (gastroesophageal reflux disease)    History of hypotension    taken midodrine in the past- Spring of 2022   Hypermobile Ehlers-Danlos syndrome    Irregular heart rate    Keratosis pilaris    Migraine    PONV (postoperative nausea and vomiting)    "I can get really cold"   PTSD (post-traumatic stress disorder)    Seasonal allergic rhinitis 03/11/2014   Past Surgical History:  Procedure Laterality Date   CHOLECYSTECTOMY  05/09/2021   ESOPHAGOGASTRODUODENOSCOPY ENDOSCOPY     03/2020   HIP ARTHROSCOPY Right 04/25/2022   Procedure: RIGHT HIP ARTHROSCOPY WITH LABRAL RECONSTRUCTION, ILIOTIBIAL BAND ALLOGRAFT, PSOAS RELEASE;  Surgeon: Vanetta Mulders, MD;  Location: [place];  Service: Orthopedics;  Laterality: Right;   LABRAL REPAIR Right 03/02/2021   Procedure: RIGHT HIP ARTHROSCOPY WITH LABRAL REPAIR AND PINCE DEBRIDEMENT;  Surgeon: Vanetta Mulders, MD;  Location: [place];  Service: Orthopedics;  Laterality: Right;   SMART PILL PROCEDURE     12/2020   TYMPANOSTOMY TUBE  PLACEMENT Bilateral    WISDOM TOOTH EXTRACTION     Patient Active Problem List   Diagnosis Date Noted   Cervical adenopathy 06/10/2021   Chronic pruritus 06/10/2021   Exercise-induced asthma 06/09/2021   Chronic venous insufficiency 06/03/2021   Orthostatic lightheadedness 06/03/2021   Purple toe syndrome of both feet 06/03/2021   Autonomic dysfunction 06/03/2021   Tear of right acetabular labrum 04/13/2021   Migraine headache 03/01/2021   Headache 11/30/2020   Positive ANA (antinuclear antibody) 11/15/2020   Fibromyalgia 11/02/2020   Hair loss 11/02/2020   Muscle spasticity 10/22/2020   Hypermobile Ehlers-Danlos syndrome 10/22/2020   Paresthesia of bilateral legs 10/06/2020   Myalgia 10/06/2020   Chronic pelvic pain in female 10/06/2020   Chronic abdominal pain 10/06/2020   Irritable colon 08/02/2020   Pelvic floor dysfunction 08/02/2020   Irritant dermatitis 08/02/2020   Gastroesophageal reflux disease 06/28/2020   Severe recurrent major depression without psychotic features 06/28/2020   Polyarthralgia 06/28/2020   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 06/28/2020   Tachycardia 06/28/2020   History of COVID-19 06/28/2020   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 deficit 06/28/2020   Recurrent major depressive disorder, in full remission 06/28/2020   Atypical chest pain 06/22/2020   Dizziness 06/22/2020   Palpitations 06/14/2020   Anxiety 05/04/2020  Internal and external hemorrhoids without complication 04/29/2020   Nondiabetic gastroparesis 03/03/2020   ETD (Eustachian tube dysfunction), bilateral 10/21/2019   Acne vulgaris 03/11/2014   Mild intermittent asthma without complication 03/11/2014   Seasonal allergies 03/11/2014    REFERRING PROVIDER:  Huel CoteBokshan, Steven, MD     REFERRING DIAG: [phone] (ICD-10-CM) - Tear of right acetabular labrum, subsequent encounter  post op 04/25/22 R hip labral repair revision with IT band allograft    THERAPY DIAG:  Pain in right hip  Muscle weakness (generalized)  Difficulty in walking, not elsewhere classified  Rationale for Evaluation and Treatment: Rehabilitation  ONSET DATE: DOS 04/25/22  Days since surgery: 119  SUBJECTIVE:   SUBJECTIVE STATEMENT:  Patient reports no real changes.  She comes in with less of an antalgic gait today but continues to report pain when she is standing and walking.  She has been working on the exercises given her at home. PERTINENT HISTORY: EDS PAIN:  Are you having pain? Yes: NPRS scale: 7/10 Pain location: rt hip Pain description: sore/achey, tight, clicks deep- more medial Aggravating factors: movement Relieving factors: ice  PRECAUTIONS:none  WEIGHT BEARING RESTRICTIONS: no longer has   FALLS:  Has patient fallen in last 6 months? 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  PLOF: Independent  PATIENT GOALS: yoga, take dance classes  OBJECTIVE:   PATIENT SURVEYS:  FOTO EVAL: 27 2/2: 59 2/29: 57  POSTURE:WFL on 1/19  PALPATION: 3/14- able to palpate ITB snapping over greater trochanter throughout hip flexion  LOWER EXTREMITY ROM:  Passive ROM Right eval Right 05/23/22 Rt  06/21/22 Rt AROM 2/29  Hip flexion 80 90 100 pinch  126 slight ant pinch  Hip extension 0     Hip abduction 30   40  Hip adduction      Hip internal rotation   25   Hip external rotation   45    (Blank rows = not tested) MMT(lb) Right 1/19 Left 1/19 Rt LE  06/21/22 Rt  2/29  Hip flexion 6.4 24.8    Hip extension   Can do single leg bridge 10 sec hold    Hip abduction 22.6 30.8 4+/5 31.5  Hip adduction      Hip internal rotation      Hip external rotation      Knee flexion 11.0 20.4  18.2  Knee extension 34.2 33.0  33.0   (Blank rows = not tested)   GAIT: EVAL: TDWB with bil axillary crutches 05/23/22: able to ambulate without AD, locked in neutral for hip extension 06/09/22: gait pattern without antalgia, good ROM and heel strike, no  trendelenburg noted   TODAY'S TREATMENT:  4/4 Manual: Side lying anterior hip stretching; Inferior glide Grade 1 to start progressing to grade II. Roller to anterior hip and gluteal  Nu-step 5 min for stretch   Posterior pelvic tilt 2 x 10 Hip abduction with band 2 x 10 red band Bridge 2x10   Standing: Step on Airex forward and lateral x 20 each Standing heel raise x 20  4/2 Manual: Side lying anterior hip stretching; Inferior glide Grade 1 to start progressing to grade II. Roller to anterior hip  Exercise bike 4 minutes.  Patient reports that whenever she rides a bike she has increased pelvic pain in general.  Show exercise bike stop.  No increase anterior hip pain with exercise bike  Posterior pelvic tilt 2 x 10 Hip abduction with band 2 x 10 red band Reviewed bridge for HEP  Long arc quad with red band 2 x 10 each leg  Reviewed patient's HEP see below and clinical impression statement    3/27 Manual: Side lying anterior hip stretching; Inferior glide Grade 1 to start progressing to grade II. Roller to anterior hip   Glut set x10  Quad set x10  Towel assisted hip flexion in pain free range 5x in pain free range   [facility] Adult PT Treatment:                                                DATE: 08/12/22 Therapeutic Exercise: Sidelying clamshell x10 Supine hamstring stretch 3x30' Rt Manual Therapy: IASTM Rt ITB, quads, HS, TFL, proximal hip flexor tendons, hamstrings, calf (discussion of TPDN to hamstrings, pt familiar with technique)       Treatment                            3/19:  Negative grind test Stretchy end feel with pain and reported pinching- anterior with flexion, post with ER Denies pain with passive extension but reports ant pinching with post to ant mobilizations Palpation to obturator creates concordant pain wrapping around proximal femur.  No pain with sacrotuberal ligament palpation Denies pain with SIJ spring testing All musculature activating to  lift leg against gravity Reports pain but not concordant upon palpation to greater trochanter and ischial tuberosity Tightness in biceps femoris that pt reports feels similar to her pain MANUAL STM to obturator Negative slump Concordant pain upon palpation to meeting point of TFL and ITB  Denies fevers, chills. Usually has night sweats, feels pretty sniffly, stopped taking ati-depressant on Friday.  Took ibuprofen which I think helped, ice helps when covering a large surface areas Antalgic gait pattern- does present hip extension at toe off    [facility] Adult PT Treatment:                                                DATE: 08/05/22 Therapeutic Exercise: Sidelying clamshell x20 Sidelying clamshell YTB 2x10 Sidelying hip abduction with extension bias 2x10 Prone hip extension 2x10 Manual Therapy: IASTM Rt ITB, quads, HS, TFL, proximal hip flexor tendons Passive flexion in sidelying for quads & ITB/TFL IASTM proximal hip flexor tendons    PATIENT EDUCATION:  Education details: Anatomy of condition, POC, HEP, exercise form/rationale Person educated: Patient and Parent Education method: Explanation, Demonstration, Tactile cues, Verbal cues, and Handouts Education comprehension: verbalized understanding, returned demonstration, verbal cues required, tactile cues required, and needs further education  HOME EXERCISE PROGRAM:  Access Code: [email or web address] URL: [email or web address]   ASSESSMENT:  CLINICAL IMPRESSION: Therapeutic therapy has been able to progress patient more towards stability exercises and perform MANUAL therapy over the last few visits.  She has been able to maintain 90 degrees of passive hip flexion with minor pinching in the front of her hip.  Will continue to work on low-grade mobilization to decrease stress of the anterior hip.  We added bridging and added standing instability work to her program today.  Will continue to progress as tolerated.  OBJECTIVE  IMPAIRMENTS: Abnormal gait, decreased activity tolerance, decreased balance, difficulty walking, decreased ROM, decreased strength, increased muscle spasms, impaired  flexibility, improper body mechanics, and pain.   ACTIVITY LIMITATIONS: carrying, lifting, bending, sitting, standing, squatting, stairs, transfers, bed mobility, bathing, dressing, and locomotion level  PARTICIPATION LIMITATIONS: meal prep, cleaning, medication management, driving, shopping, community activity, and occupation  PERSONAL FACTORS: 3+ comorbidities: previous labral repair, EDS, autonomic dysfunction  are also affecting patient's functional outcome.     GOALS: Goals reviewed with patient? Yes  SHORT TERM GOALS: Target date: 05/26/22 30s STS without pain Baseline: x12 Goal status: achieved  2.  Able to demo household ambulation without AD and with proper pattern Baseline:  Goal status: achieved    LONG TERM GOALS: Target date: POC date  Able to demo step up on 6" step with level pelvis and proper form Baseline:  Goal status: 06/20/22  2.  Will tolerate at least 3 min on elliptical, demonstrating good tolerance to repetitive weight bearing motion Baseline:  Goal status: achieved Week 8 06/20/22  3.  Demonstrate proper form in at least 10 continuous lunges without increased pain Baseline:  Goal status: ongoing  4.  Demonstrate gentle, double and single foot plyometric motions with good proximal form Baseline:  Goal status: ongoing  5.  Pt will begin gradual return to group fitness with understanding of modifications and precautions Baseline:  Goal status: ongoing    PLAN:  PT FREQUENCY: 1-2x/week  PT DURATION: 12 weeks  PLANNED INTERVENTIONS: Therapeutic exercises, Therapeutic activity, Neuromuscular re-education, Balance training, Gait training, Patient/Family education, Self Care, Joint mobilization, Stair training, Aquatic Therapy, Dry Needling, Electrical stimulation, Spinal mobilization,  Cryotherapy, Moist heat, Taping, Traction, Ultrasound, Ionotophoresis 4mg /ml Dexamethasone, and Manual therapy  PLAN FOR NEXT SESSION: STM to hip musculature PROM with mobs as tolerated.   Lorayne Benderavid Parsa Rickett PT DPT  08/22/22 8:36 PM

## 2022-08-28 ENCOUNTER — Encounter (HOSPITAL_COMMUNITY): Payer: Self-pay

## 2022-08-28 ENCOUNTER — Ambulatory Visit (HOSPITAL_COMMUNITY)
Admission: RE | Admit: 2022-08-28 | Discharge: 2022-08-28 | Disposition: A | Discharge status: Home / Self Care | Payer: BC Managed Care – PPO | Source: Ambulatory Visit

## 2022-08-28 ENCOUNTER — Ambulatory Visit (HOSPITAL_BASED_OUTPATIENT_CLINIC_OR_DEPARTMENT_OTHER): Payer: BC Managed Care – PPO | Admitting: Physical Therapy

## 2022-08-28 VITALS — BP 109/76 | HR 83 | Temp 98.2°F | Resp 12

## 2022-08-28 DIAGNOSIS — J069 Acute upper respiratory infection, unspecified: Secondary | ICD-10-CM | POA: Diagnosis not present

## 2022-08-28 MED ORDER — GUAIFENESIN-CODEINE 100-10 MG/5ML PO SOLN: 5.0000 mL | Freq: Four times a day (QID) | ORAL | 0 refills | Status: DC | PRN

## 2022-08-28 MED ORDER — IPRATROPIUM BROMIDE 0.03 % NA SOLN: 2.0000 | Freq: Two times a day (BID) | NASAL | 12 refills | Status: DC

## 2022-08-28 NOTE — ED Provider Notes (Signed)
[facility]    CSN: [id number] Arrival date & time: 08/28/22  1146      History   Chief Complaint Chief Complaint  Patient presents with   Cough    Started as cough 4/2, progressed since then with accompanying congestion, sore throat, sinus and ear pain, nausea, abdominal cramps - Entered by patient    HPI Brianna [name] is a 27 y.o. female.   Patient presents for evaluation of chills, body aches, nasal congestion, rhinorrhea, sore throat, cough, bilateral ear pain, abdominal pain, diarrhea, nausea present for 6 days.  Known sick contact prior.  Tolerating food and liquids.  Believes abdominal symptoms are related to her known gastroparesis.  Has attempted use of Zicam, Tylenol, Sudafed, Mucinex, cough drops and hot tea .  Denies respiratory history.  Denies shortness of breath or wheezing.     Past Medical History:  Diagnosis Date   Acute recurrent pansinusitis 10/21/2019   ADHD (attention deficit hyperactivity disorder)    inattentive   Anemia    had iron infusions in Fall of 2023   Anxiety    Asthma    Bronchitis    Cat allergy due to both airborne and skin contact 05/01/2016   Chronic sore throat 05/01/2016   COVID 2020   Depression    Fibromyalgia    pt denies   Gastroparesis    GERD (gastroesophageal reflux disease)    History of hypotension    taken midodrine in the past- Spring of 2022   Hypermobile Ehlers-Danlos syndrome    Irregular heart rate    Keratosis pilaris    Migraine    PONV (postoperative nausea and vomiting)    "I can get really cold"   PTSD (post-traumatic stress disorder)    Seasonal allergic rhinitis 03/11/2014    Patient Active Problem List   Diagnosis Date Noted   Cervical adenopathy 06/10/2021   Chronic pruritus 06/10/2021   Exercise-induced asthma 06/09/2021   Chronic venous insufficiency 06/03/2021   Orthostatic lightheadedness 06/03/2021   Purple toe syndrome of both feet 06/03/2021   Autonomic dysfunction  06/03/2021   Tear of right acetabular labrum 04/13/2021   Migraine headache 03/01/2021   Headache 11/30/2020   Positive ANA (antinuclear antibody) 11/15/2020   Fibromyalgia 11/02/2020   Hair loss 11/02/2020   Muscle spasticity 10/22/2020   Hypermobile Ehlers-Danlos syndrome 10/22/2020   Paresthesia of bilateral legs 10/06/2020   Myalgia 10/06/2020   Chronic pelvic pain in female 10/06/2020   Chronic abdominal pain 10/06/2020   Irritable colon 08/02/2020   Pelvic floor dysfunction 08/02/2020   Irritant dermatitis 08/02/2020   Gastroesophageal reflux disease 06/28/2020   Severe recurrent major depression without psychotic features 06/28/2020   Polyarthralgia 06/28/2020   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 06/28/2020   Tachycardia 06/28/2020   History of COVID-19 06/28/2020   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 deficit 06/28/2020   Recurrent major depressive disorder, in full remission 06/28/2020   Atypical chest pain 06/22/2020   Dizziness 06/22/2020   Palpitations 06/14/2020   Anxiety 05/04/2020   Internal and external hemorrhoids without complication 04/29/2020   Nondiabetic gastroparesis 03/03/2020   ETD (Eustachian tube dysfunction), bilateral 10/21/2019   Acne vulgaris 03/11/2014   Mild intermittent asthma without complication 03/11/2014   Seasonal allergies 03/11/2014    Past Surgical History:  Procedure Laterality Date   CHOLECYSTECTOMY  05/09/2021   ESOPHAGOGASTRODUODENOSCOPY ENDOSCOPY     03/2020   HIP ARTHROSCOPY Right 04/25/2022   Procedure: RIGHT HIP ARTHROSCOPY WITH LABRAL RECONSTRUCTION,  ILIOTIBIAL BAND ALLOGRAFT, PSOAS RELEASE;  Surgeon: [name], MD;  Location[facility];  Service: Orthopedics;  Laterality: Right;   LABRAL REPAIR Right 03/02/2021   Procedure: RIGHT HIP ARTHROSCOPY WITH LABRAL REPAIR AND PINCE DEBRIDEMENT;  Surgeon: [name], MD;  Location[facility];  Service: Orthopedics;  Laterality: Right;    SMART PILL PROCEDURE     12/2020   TYMPANOSTOMY TUBE PLACEMENT Bilateral    WISDOM TOOTH EXTRACTION      OB History     Gravida  1   Para  0   Term      Preterm  0   AB  1   Living  0      SAB      IAB  1   Ectopic  0   Multiple      Live Births  0            Home Medications    Prior to Admission medications   Medication Sig Start Date End Date Taking? Authorizing Provider  Dextromethorphan-buPROPion ER (AUVELITY) 45-105 MG TBCR Take 1 tablet by mouth daily. 01/20/22  Yes [provider]  loratadine (CLARITIN) 10 MG tablet Take 10 mg by mouth daily.   Yes [provider]  Omega-3 Fatty Acids (FISH OIL PO) Take 1,400 Units by mouth daily. Mini   Yes [provider]  Pediatric Multiple Vitamins (PEDIATRIC MULTIVITAMIN) chewable tablet Chew 1 tablet by mouth daily.   Yes [provider]  tazarotene (TAZORAC) 0.05 % cream Apply 1 Application topically every other day.   Yes [provider]  cyclobenzaprine (FLEXERIL) 10 MG tablet Take 1 tablet (10 mg total) by mouth 3 (three) times daily as needed for muscle spasms. 07/29/22   [name], MD  naratriptan (AMERGE) 2.5 MG tablet Take 2.5 mg by mouth as needed for migraine. 08/05/21   [provider]  ondansetron (ZOFRAN ODT) 4 MG disintegrating tablet Take 1 tablet (4 mg total) by mouth every 8 (eight) hours as needed for nausea or vomiting. 11/30/20   Drema Dallas, DO  promethazine (PHENERGAN) 25 MG tablet Take 1 tablet (25 mg total) by mouth every 6 (six) hours as needed for nausea or vomiting. Patient taking differently: Take 12.5 mg by mouth every 6 (six) hours as needed for nausea or vomiting. 03/08/21   Mannie Stabile, PA-C  triamcinolone cream (KENALOG) 0.1 % Apply 1 Application topically daily as needed Jefferey Pica).    [provider]    Family History Family History  Problem Relation Age of Onset   Diabetes Mother    Polycystic ovary syndrome  Mother    GER disease Father    Heart disease Paternal Uncle     Social History Social History   Tobacco Use   Smoking status: Former    Types: E-cigarettes, Cigarettes    Quit date: 2021    Years since quitting: 3.2   Smokeless tobacco: Never  Vaping Use   Vaping Use: Former   Substances: Financial trader  Substance Use Topics   Alcohol use: Not Currently   Drug use: Yes    Frequency: 7.0 times per week    Types: Marijuana     Allergies   Clindamycin and Tretinoin   Review of Systems Review of Systems  Constitutional:  Positive for chills. Negative for activity change, appetite change, diaphoresis, fatigue, fever and unexpected weight change.  HENT:  Positive for congestion, ear pain, rhinorrhea and sore throat. Negative for dental problem, drooling,  ear discharge, facial swelling, hearing loss, mouth sores, nosebleeds, postnasal drip, sinus pressure, sinus pain, sneezing, tinnitus, trouble swallowing and voice change.   Respiratory:  Positive for cough. Negative for apnea, choking, chest tightness, shortness of breath, wheezing and stridor.   Cardiovascular: Negative.   Gastrointestinal:  Positive for abdominal pain, diarrhea and nausea. Negative for abdominal distention, anal bleeding, blood in stool, constipation, rectal pain and vomiting.  Musculoskeletal:  Positive for myalgias. Negative for arthralgias, back pain, gait problem, joint swelling, neck pain and neck stiffness.  Skin: Negative.      Physical Exam Triage Vital Signs ED Triage Vitals  Enc Vitals Group     BP 08/28/22 1213 109/76     Pulse Rate 08/28/22 1213 83     Resp 08/28/22 1213 12     Temp 08/28/22 1213 98.2 F (36.8 C)     Temp Source 08/28/22 1213 Oral     SpO2 08/28/22 1213 99 %     Weight --      Height --      Head Circumference --      Peak Flow --      Pain Score 08/28/22 1208 7     Pain Loc --      Pain Edu? --      Excl. in GC? --    No data found.  Updated Vital  Signs BP 109/76 (BP Location: Left Arm)   Pulse 83   Temp 98.2 F (36.8 C) (Oral)   Resp 12   LMP 08/05/2022   SpO2 99%   Visual Acuity Right Eye Distance:   Left Eye Distance:   Bilateral Distance:    Right Eye Near:   Left Eye Near:    Bilateral Near:     Physical Exam Constitutional:      Appearance: Normal appearance.  HENT:     Head: Normocephalic.     Right Ear: Ear canal and external ear normal. A middle ear effusion is present.     Left Ear: Ear canal and external ear normal. A middle ear effusion is present.     Nose: Congestion and rhinorrhea present.     Right Sinus: Maxillary sinus tenderness present. No frontal sinus tenderness.     Left Sinus: Maxillary sinus tenderness present. No frontal sinus tenderness.     Mouth/Throat:     Mouth: Mucous membranes are moist.     Pharynx: No posterior oropharyngeal erythema.  Eyes:     Extraocular Movements: Extraocular movements intact.  Cardiovascular:     Rate and Rhythm: Normal rate and regular rhythm.     Pulses: Normal pulses.     Heart sounds: Normal heart sounds.  Pulmonary:     Effort: Pulmonary effort is normal.     Breath sounds: Normal breath sounds.  Musculoskeletal:     Cervical back: Normal range of motion and neck supple.  Skin:    General: Skin is warm and dry.  Neurological:     Mental Status: She is alert and oriented to person, place, and time. Mental status is at baseline.  Psychiatric:        Mood and Affect: Mood normal.        Behavior: Behavior normal.      UC Treatments / Results  Labs (all labs ordered are listed, but only abnormal results are displayed) Labs Reviewed - No data to display  EKG   Radiology No results found.  Procedures Procedures (including critical care time)  Medications Ordered  in UC Medications - No data to display  Initial Impression / Assessment and Plan / UC Course  I have reviewed the triage vital signs and the nursing notes.  Pertinent labs &  imaging results that were available during my care of the patient were reviewed by me and considered in my medical decision making (see chart for details).   Viral URI with cough  Patient is in no signs of distress nor toxic appearing.  Vital signs are stable.  Low suspicion for pneumonia, pneumothorax or bronchitis and therefore will defer imaging.  Viral testing deferred due to timeline of illness.  Prescribed ipratropium nasal spray and guaifenesin codeine for outpatient management.  May continue use of over-the-counter medicines for additional comfort.May use additional over-the-counter medications as needed for supportive care.  May follow-up with urgent care as needed if symptoms persist or worsen.  Note given.   Final Clinical Impressions(s) / UC Diagnoses   Final diagnoses:  None   Discharge Instructions   None    ED Prescriptions   None    PDMP not reviewed this encounter.   Valinda Hoar, NP 08/29/22 1021

## 2022-08-28 NOTE — Discharge Instructions (Signed)
Your symptoms today are most likely being caused by a virus and should steadily improve in time it can take up to 7 to 10 days before you truly start to see a turnaround however things will get better  Begin use of ipratropium nasal spray every morning and every evening to help clear out sinus congestion, this should help with your pain, if this is causing nosebleeds is most likely to dryness to the airway, you may use a humidifier at nighttime, if you do not have 1 you may steam up the shower and sit inside, may also use next saline nasal spray next prior to medication use or coat nose with petroleum or similar product  You may use guaifenesin codeine every 6 hours as needed for comfort, please be mindful this medication may make you feel drowsy  Attempt use of your Tessalon and promethazine medications to help with your cough and congestion    You can take Tylenol and/or Ibuprofen as needed for fever reduction and pain relief.   For cough: honey 1/2 to 1 teaspoon (you can dilute the honey in water or another fluid).  You can also use guaifenesin and dextromethorphan for cough. You can use a humidifier for chest congestion and cough.  If you don't have a humidifier, you can sit in the bathroom with the hot shower running.      For sore throat: try warm salt water gargles, cepacol lozenges, throat spray, warm tea or water with lemon/honey, popsicles or ice, or OTC cold relief medicine for throat discomfort.   For congestion: take a daily anti-histamine like Zyrtec, Claritin, and a oral decongestant, such as pseudoephedrine.  You can also use Flonase 1-2 sprays in each nostril daily.   It is important to stay hydrated: drink plenty of fluids (water, gatorade/powerade/pedialyte, juices, or teas) to keep your throat moisturized and help further relieve irritation/discomfort.

## 2022-08-28 NOTE — ED Triage Notes (Signed)
Pt is here for cough, sore throat , ear fullness, nausea, diarrhea, runny nose , nasal congestion , headache, body aches, chills, low energy , loss of appetite, x 6days . Onset started on 4/2 and has gotten worse.

## 2022-08-30 ENCOUNTER — Encounter (HOSPITAL_BASED_OUTPATIENT_CLINIC_OR_DEPARTMENT_OTHER): Payer: Self-pay | Admitting: Physical Therapy

## 2022-08-30 ENCOUNTER — Ambulatory Visit (HOSPITAL_BASED_OUTPATIENT_CLINIC_OR_DEPARTMENT_OTHER): Payer: BC Managed Care – PPO | Admitting: Physical Therapy

## 2022-08-30 DIAGNOSIS — M25551 Pain in right hip: Secondary | ICD-10-CM

## 2022-08-30 DIAGNOSIS — M6281 Muscle weakness (generalized): Secondary | ICD-10-CM

## 2022-08-30 NOTE — Therapy (Signed)
OUTPATIENT PHYSICAL THERAPY LOWER EXTREMITY TREATMENT   Patient Name: Brianna [name] MRN: [id number] DOB:01/29/1996, 27 y.o., female Today's Date: 08/30/2022  END OF SESSION:  PT End of Session - 08/30/22 0932     Visit Number 29    Number of Visits 36    Date for PT Re-Evaluation 09/15/22    PT Start Time 0930    PT Stop Time 1014    PT Time Calculation (min) 44 min    Activity Tolerance Patient tolerated treatment well    Behavior During Therapy [facility] for tasks assessed/performed                Past Medical History:  Diagnosis Date   Acute recurrent pansinusitis 10/21/2019   ADHD (attention deficit hyperactivity disorder)    inattentive   Anemia    had iron infusions in Fall of 2023   Anxiety    Asthma    Bronchitis    Cat allergy due to both airborne and skin contact 05/01/2016   Chronic sore throat 05/01/2016   COVID 2020   Depression    Fibromyalgia    pt denies   Gastroparesis    GERD (gastroesophageal reflux disease)    History of hypotension    taken midodrine in the past- Spring of 2022   Hypermobile Ehlers-Danlos syndrome    Irregular heart rate    Keratosis pilaris    Migraine    PONV (postoperative nausea and vomiting)    "I can get really cold"   PTSD (post-traumatic stress disorder)    Seasonal allergic rhinitis 03/11/2014   Past Surgical History:  Procedure Laterality Date   CHOLECYSTECTOMY  05/09/2021   ESOPHAGOGASTRODUODENOSCOPY ENDOSCOPY     03/2020   HIP ARTHROSCOPY Right 04/25/2022   Procedure: RIGHT HIP ARTHROSCOPY WITH LABRAL RECONSTRUCTION, ILIOTIBIAL BAND ALLOGRAFT, PSOAS RELEASE;  Surgeon: [name], MD;  Location: [facility] OR;  Service: Orthopedics;  Laterality: Right;   LABRAL REPAIR Right 03/02/2021   Procedure: RIGHT HIP ARTHROSCOPY WITH LABRAL REPAIR AND PINCE DEBRIDEMENT;  Surgeon: [name], MD;  Location: [facility] OR;  Service: Orthopedics;  Laterality: Right;   SMART PILL PROCEDURE     12/2020   TYMPANOSTOMY  TUBE PLACEMENT Bilateral    WISDOM TOOTH EXTRACTION     Patient Active Problem List   Diagnosis Date Noted   Cervical adenopathy 06/10/2021   Chronic pruritus 06/10/2021   Exercise-induced asthma 06/09/2021   Chronic venous insufficiency 06/03/2021   Orthostatic lightheadedness 06/03/2021   Purple toe syndrome of both feet 06/03/2021   Autonomic dysfunction 06/03/2021   Tear of right acetabular labrum 04/13/2021   Migraine headache 03/01/2021   Headache 11/30/2020   Positive ANA (antinuclear antibody) 11/15/2020   Fibromyalgia 11/02/2020   Hair loss 11/02/2020   Muscle spasticity 10/22/2020   Hypermobile Ehlers-Danlos syndrome 10/22/2020   Paresthesia of bilateral legs 10/06/2020   Myalgia 10/06/2020   Chronic pelvic pain in female 10/06/2020   Chronic abdominal pain 10/06/2020   Irritable colon 08/02/2020   Pelvic floor dysfunction 08/02/2020   Irritant dermatitis 08/02/2020   Gastroesophageal reflux disease 06/28/2020   Severe recurrent major depression without psychotic features 06/28/2020   Polyarthralgia 06/28/2020   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 06/28/2020   Tachycardia 06/28/2020   History of COVID-19 06/28/2020   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 deficit 06/28/2020   Recurrent major depressive disorder, in full remission 06/28/2020   Atypical chest pain 06/22/2020   Dizziness 06/22/2020   Palpitations 06/14/2020   Anxiety  05/04/2020   Internal and external hemorrhoids without complication 04/29/2020   Nondiabetic gastroparesis 03/03/2020   ETD (Eustachian tube dysfunction), bilateral 10/21/2019   Acne vulgaris 03/11/2014   Mild intermittent asthma without complication 03/11/2014   Seasonal allergies 03/11/2014    REFERRING PROVIDER:  Huel CoteBokshan, Steven, MD     REFERRING DIAG: [phone] (ICD-10-CM) - Tear of right acetabular labrum, subsequent encounter  post op 04/25/22 R hip labral repair revision with IT band  allograft   THERAPY DIAG:  Pain in right hip  Muscle weakness (generalized)  Rationale for Evaluation and Treatment: Rehabilitation  ONSET DATE: DOS 04/25/22  Days since surgery: 127  SUBJECTIVE:   SUBJECTIVE STATEMENT:  I can flex with less discomfort but still have the snapping.   PERTINENT HISTORY: EDS PAIN:  Are you having pain? Yes: NPRS scale: 7/10 Pain location: rt hip Pain description: sore/achey, tight, clicks deep- more medial Aggravating factors: movement Relieving factors: ice  PRECAUTIONS:none  WEIGHT BEARING RESTRICTIONS: no longer has   FALLS:  Has patient fallen in last 6 months? 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  PLOF: Independent  PATIENT GOALS: yoga, take dance classes  OBJECTIVE:   PATIENT SURVEYS:  FOTO EVAL: 27 2/2: 59 2/29: 57  POSTURE:WFL on 1/19  PALPATION: 3/14- able to palpate ITB snapping over greater trochanter throughout hip flexion  LOWER EXTREMITY ROM:  Passive ROM Right eval Right 05/23/22 Rt  06/21/22 Rt AROM 2/29  Hip flexion 80 90 100 pinch  126 slight ant pinch  Hip extension 0     Hip abduction 30   40  Hip adduction      Hip internal rotation   25   Hip external rotation   45    (Blank rows = not tested) MMT(lb) Right 1/19 Left 1/19 Rt LE  06/21/22 Rt  2/29 Rt 4/10  Hip flexion 6.4 24.8     Hip extension   Can do single leg bridge 10 sec hold     Hip abduction 22.6 30.8 4+/5 31.5   Hip adduction       Hip internal rotation       Hip external rotation       Knee flexion 11.0 20.4  18.2   Knee extension 34.2 33.0  33.0    (Blank rows = not tested)   GAIT: EVAL: TDWB with bil axillary crutches 05/23/22: able to ambulate without AD, locked in neutral for hip extension 06/09/22: gait pattern without antalgia, good ROM and heel strike, no trendelenburg noted   TODAY'S TREATMENT:  Treatment                            08/30/22:  Trial of SIJ belt with SLR- PT assisted in pelvic stabilization via ASIS  on opp side Primal push ups- 5x7 breath hold Forearm plank 4x5 breaths Seated round back- ball bw knees, [name] to 90, 120, 160 deg flexion STM to Rt quads, HS and TFL; ktape star to SIJs   4/4 Manual: Side lying anterior hip stretching; Inferior glide Grade 1 to start progressing to grade II. Roller to anterior hip and gluteal  Nu-step 5 min for stretch   Posterior pelvic tilt 2 x 10 Hip abduction with band 2 x 10 red band Bridge 2x10   Standing: Step on Airex forward and lateral x 20 each Standing heel raise x 20  4/2 Manual: Side lying anterior hip stretching; Inferior glide Grade 1 to start progressing  to grade II. Roller to anterior hip  Exercise bike 4 minutes.  Patient reports that whenever she rides a bike she has increased pelvic pain in general.  Show exercise bike stop.  No increase anterior hip pain with exercise bike  Posterior pelvic tilt 2 x 10 Hip abduction with band 2 x 10 red band Reviewed bridge for HEP  Long arc quad with red band 2 x 10 each leg  Reviewed patient's HEP see below and clinical impression statement      PATIENT EDUCATION:  Education details: Anatomy of condition, POC, HEP, exercise form/rationale Person educated: Patient and Parent Education method: Explanation, Demonstration, Tactile cues, Verbal cues, and Handouts Education comprehension: verbalized understanding, returned demonstration, verbal cues required, tactile cues required, and needs further education  HOME EXERCISE PROGRAM:  Access Code: [email or web address] URL: [email or web address]   ASSESSMENT:  CLINICAL IMPRESSION: Good mobility noted in hip joint today. Changed focus for core engagement rather than hip to allow for stability from elsewhere.    OBJECTIVE IMPAIRMENTS: Abnormal gait, decreased activity tolerance, decreased balance, difficulty walking, decreased ROM, decreased strength, increased muscle spasms, impaired flexibility, improper body mechanics,  and pain.   ACTIVITY LIMITATIONS: carrying, lifting, bending, sitting, standing, squatting, stairs, transfers, bed mobility, bathing, dressing, and locomotion level  PARTICIPATION LIMITATIONS: meal prep, cleaning, medication management, driving, shopping, community activity, and occupation  PERSONAL FACTORS: 3+ comorbidities: previous labral repair, EDS, autonomic dysfunction  are also affecting patient's functional outcome.     GOALS: Goals reviewed with patient? Yes  SHORT TERM GOALS: Target date: 05/26/22 30s STS without pain Baseline: x12 Goal status: achieved  2.  Able to demo household ambulation without AD and with proper pattern Baseline:  Goal status: achieved    LONG TERM GOALS: Target date: POC date  Able to demo step up on 6" step with level pelvis and proper form Baseline:  Goal status: 06/20/22  2.  Will tolerate at least 3 min on elliptical, demonstrating good tolerance to repetitive weight bearing motion Baseline:  Goal status: achieved Week 8 06/20/22  3.  Demonstrate proper form in at least 10 continuous lunges without increased pain Baseline:  Goal status: ongoing  4.  Demonstrate gentle, double and single foot plyometric motions with good proximal form Baseline:  Goal status: ongoing  5.  Pt will begin gradual return to group fitness with understanding of modifications and precautions Baseline:  Goal status: ongoing    PLAN:  PT FREQUENCY: 1-2x/week  PT DURATION: 12 weeks  PLANNED INTERVENTIONS: Therapeutic exercises, Therapeutic activity, Neuromuscular re-education, Balance training, Gait training, Patient/Family education, Self Care, Joint mobilization, Stair training, Aquatic Therapy, Dry Needling, Electrical stimulation, Spinal mobilization, Cryotherapy, Moist heat, Taping, Traction, Ultrasound, Ionotophoresis 4mg /ml Dexamethasone, and Manual therapy  PLAN FOR NEXT SESSION: STM to hip musculature PROM with mobs as tolerated.   Juanangel Soderholm C.  Darianne Muralles PT, DPT 08/30/22 11:33 AM

## 2022-08-31 ENCOUNTER — Encounter: Payer: Self-pay | Admitting: Obstetrics & Gynecology

## 2022-08-31 ENCOUNTER — Ambulatory Visit (INDEPENDENT_AMBULATORY_CARE_PROVIDER_SITE_OTHER): Payer: BC Managed Care – PPO | Admitting: Obstetrics & Gynecology

## 2022-08-31 VITALS — BP 115/73 | HR 92 | Ht 64.0 in | Wt 118.0 lb

## 2022-08-31 DIAGNOSIS — R61 Generalized hyperhidrosis: Secondary | ICD-10-CM

## 2022-08-31 DIAGNOSIS — R4589 Other symptoms and signs involving emotional state: Secondary | ICD-10-CM | POA: Diagnosis not present

## 2022-08-31 DIAGNOSIS — Z3200 Encounter for pregnancy test, result unknown: Secondary | ICD-10-CM

## 2022-08-31 DIAGNOSIS — Z3202 Encounter for pregnancy test, result negative: Secondary | ICD-10-CM | POA: Diagnosis not present

## 2022-08-31 DIAGNOSIS — L659 Nonscarring hair loss, unspecified: Secondary | ICD-10-CM

## 2022-08-31 LAB — POCT URINE PREGNANCY: Preg Test, Ur: NEGATIVE

## 2022-08-31 NOTE — Progress Notes (Signed)
GYN VISIT Patient name: Anndee Tripi MRN [id number]  Date of birth: November 23, 1995 Chief Complaint:   Night Sweats  History of Present Illness:   Ranae Kidwell is a 27 y.o. G29P0010  female being seen today for the following concerns:  -Night sweats, hair shedding that has gotten worse for the past 47yrs. this has been an ongoing issue.  Patient has been seen by several specialists and has tried multiple medications and treatment options.  Per patient to date nothing has worked.  Workup with all specialist have been normal.  I asked her today what were her expectations and she replied that she wants to know what is wrong with her.  Patient reports that menses are typically every month- q 23-35 days.  Typically 5 days, light but painful.  Due to migraines with aura she is not a candidate for combined treatment.    She was previously taking Slynd however it was thought that this medication may be contributing to her concerns.  She also reports a history of taking low Loestrin for many years when she was younger.  Based on above we will plan to continue with condoms/pullout for contraception.  Patient did share that she was diagnosed with PCOS per [facility] endocrinology.  Ultrasound completed in 2022 was reviewed and did not show signs of PCOS  Patient's last menstrual period was 08/05/2022.     03/13/2022    2:50 PM 01/12/2022   10:07 AM 02/14/2021   12:13 PM 01/11/2021    9:23 AM 11/02/2020    3:33 PM  Depression screen PHQ 2/9  Decreased Interest 3 3 1 1 2   Down, Depressed, Hopeless 3 3 2 1 2   PHQ - 2 Score 6 6 3 2 4   Altered sleeping   3  3  Tired, decreased energy   3  3  Change in appetite   3  3  Feeling bad or failure about yourself    2  3  Trouble concentrating   3  3  Moving slowly or fidgety/restless   3  3  Suicidal thoughts  3 1  1   PHQ-9 Score   21  23  Difficult doing work/chores   Very difficult  Extremely dIfficult     Review of Systems:   Pertinent items  are noted in HPI Pertinent History Reviewed:  Reviewed past medical,surgical, social, obstetrical and family history.  Reviewed problem list, medications and allergies. Physical Assessment:   Vitals:   08/31/22 1535  BP: 115/73  Pulse: 92  Weight: 118 lb (53.5 kg)  Height: 5\' 4"  (1.626 m)  Body mass index is 20.25 kg/m.       Physical Examination:   General appearance: alert, well appearing  Psych: flat affect, seemed frustrated  Skin: warm & dry   Cardiovascular: normal heart rate noted  Respiratory: normal respiratory effort, no distress  Extremities: no edema   Chaperone: N/A    Assessment & Plan:  1) Night sweats, Hair shedding, Anxiety about health -Reviewed prior lab work with patient -Based on the fact that her periods are every month and lab work was normal I did not think there was acute gynecologic concerns -May have considered combined contraception however due to her past medical history this is not an option.  Patient has tried POP's with no improvement. -Mentioned conservative options and/or supplements for her night sweats and hair shedding and states she is already tried them all -Discussed with patient that at this point the etiology of her symptoms  are unknown I also discussed my concern with her that we may never fully be able to "label" or diagnosis her problem.  She did not like this answer and states she will continue to pursue finding out what is wrong with her -Advised integrative health as a potential option  Orders Placed This Encounter  Procedures   POCT urine pregnancy    Myna Hidalgo, DO Attending Obstetrician & Gynecologist, [facility] [employer], [facility]

## 2022-08-31 NOTE — Progress Notes (Signed)
Pt c/o night sweats, fatigue and hair loss

## 2022-09-07 ENCOUNTER — Encounter (HOSPITAL_BASED_OUTPATIENT_CLINIC_OR_DEPARTMENT_OTHER): Payer: Self-pay | Admitting: Physical Therapy

## 2022-09-07 ENCOUNTER — Ambulatory Visit (HOSPITAL_BASED_OUTPATIENT_CLINIC_OR_DEPARTMENT_OTHER): Payer: BC Managed Care – PPO | Admitting: Physical Therapy

## 2022-09-07 DIAGNOSIS — M6281 Muscle weakness (generalized): Secondary | ICD-10-CM

## 2022-09-07 DIAGNOSIS — M25551 Pain in right hip: Secondary | ICD-10-CM

## 2022-09-07 DIAGNOSIS — R262 Difficulty in walking, not elsewhere classified: Secondary | ICD-10-CM

## 2022-09-07 NOTE — Therapy (Signed)
OUTPATIENT PHYSICAL THERAPY LOWER EXTREMITY TREATMENT   Patient Name: Brianna [name] MRN: [id number] DOB:01/29/1996, 27 y.o., female Today's Date: 08/30/2022  END OF SESSION:  PT End of Session - 08/30/22 0932     Visit Number 29    Number of Visits 36    Date for PT Re-Evaluation 09/15/22    PT Start Time 0930    PT Stop Time 1014    PT Time Calculation (min) 44 min    Activity Tolerance Patient tolerated treatment well    Behavior During Therapy [facility] for tasks assessed/performed                Past Medical History:  Diagnosis Date   Acute recurrent pansinusitis 10/21/2019   ADHD (attention deficit hyperactivity disorder)    inattentive   Anemia    had iron infusions in Fall of 2023   Anxiety    Asthma    Bronchitis    Cat allergy due to both airborne and skin contact 05/01/2016   Chronic sore throat 05/01/2016   COVID 2020   Depression    Fibromyalgia    pt denies   Gastroparesis    GERD (gastroesophageal reflux disease)    History of hypotension    taken midodrine in the past- Spring of 2022   Hypermobile Ehlers-Danlos syndrome    Irregular heart rate    Keratosis pilaris    Migraine    PONV (postoperative nausea and vomiting)    "I can get really cold"   PTSD (post-traumatic stress disorder)    Seasonal allergic rhinitis 03/11/2014   Past Surgical History:  Procedure Laterality Date   CHOLECYSTECTOMY  05/09/2021   ESOPHAGOGASTRODUODENOSCOPY ENDOSCOPY     03/2020   HIP ARTHROSCOPY Right 04/25/2022   Procedure: RIGHT HIP ARTHROSCOPY WITH LABRAL RECONSTRUCTION, ILIOTIBIAL BAND ALLOGRAFT, PSOAS RELEASE;  Surgeon: [name], MD;  Location: [facility] OR;  Service: Orthopedics;  Laterality: Right;   LABRAL REPAIR Right 03/02/2021   Procedure: RIGHT HIP ARTHROSCOPY WITH LABRAL REPAIR AND PINCE DEBRIDEMENT;  Surgeon: [name], MD;  Location: [facility] OR;  Service: Orthopedics;  Laterality: Right;   SMART PILL PROCEDURE     12/2020   TYMPANOSTOMY  TUBE PLACEMENT Bilateral    WISDOM TOOTH EXTRACTION     Patient Active Problem List   Diagnosis Date Noted   Cervical adenopathy 06/10/2021   Chronic pruritus 06/10/2021   Exercise-induced asthma 06/09/2021   Chronic venous insufficiency 06/03/2021   Orthostatic lightheadedness 06/03/2021   Purple toe syndrome of both feet 06/03/2021   Autonomic dysfunction 06/03/2021   Tear of right acetabular labrum 04/13/2021   Migraine headache 03/01/2021   Headache 11/30/2020   Positive ANA (antinuclear antibody) 11/15/2020   Fibromyalgia 11/02/2020   Hair loss 11/02/2020   Muscle spasticity 10/22/2020   Hypermobile Ehlers-Danlos syndrome 10/22/2020   Paresthesia of bilateral legs 10/06/2020   Myalgia 10/06/2020   Chronic pelvic pain in female 10/06/2020   Chronic abdominal pain 10/06/2020   Irritable colon 08/02/2020   Pelvic floor dysfunction 08/02/2020   Irritant dermatitis 08/02/2020   Gastroesophageal reflux disease 06/28/2020   Severe recurrent major depression without psychotic features 06/28/2020   Polyarthralgia 06/28/2020   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 06/28/2020   Tachycardia 06/28/2020   History of COVID-19 06/28/2020   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 deficit 06/28/2020   Recurrent major depressive disorder, in full remission 06/28/2020   Atypical chest pain 06/22/2020   Dizziness 06/22/2020   Palpitations 06/14/2020   Anxiety  05/04/2020   Internal and external hemorrhoids without complication 04/29/2020   Nondiabetic gastroparesis 03/03/2020   ETD (Eustachian tube dysfunction), bilateral 10/21/2019   Acne vulgaris 03/11/2014   Mild intermittent asthma without complication 03/11/2014   Seasonal allergies 03/11/2014    REFERRING PROVIDER:  [name], MD     REFERRING DIAG: [phone] (ICD-10-CM) - Tear of right acetabular labrum, subsequent encounter  post op 04/25/22 R hip labral repair revision with IT band  allograft   THERAPY DIAG:  Pain in right hip  Muscle weakness (generalized)  Rationale for Evaluation and Treatment: Rehabilitation  ONSET DATE: DOS 04/25/22  Days since surgery: 127  SUBJECTIVE:   SUBJECTIVE STATEMENT:  The patient continues to report snapping in her hip but this is baseline on both. Her pain level comes and goes. She still feels some pinching at times.   PERTINENT HISTORY: EDS PAIN:  Are you having pain? Yes: NPRS scale: 7/10 Pain location: rt hip Pain description: sore/achey, tight, clicks deep- more medial Aggravating factors: movement Relieving factors: ice  PRECAUTIONS:none  WEIGHT BEARING RESTRICTIONS: no longer has   FALLS:  Has patient fallen in last 6 months? 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  PLOF: Independent  PATIENT GOALS: yoga, take dance classes  OBJECTIVE:   PATIENT SURVEYS:  FOTO EVAL: 27 2/2: 59 2/29: 57  POSTURE:WFL on 1/19  PALPATION: 3/14- able to palpate ITB snapping over greater trochanter throughout hip flexion  LOWER EXTREMITY ROM:  Passive ROM Right eval Right 05/23/22 Rt  06/21/22 Rt AROM 2/29  Hip flexion 80 90 100 pinch  126 slight ant pinch  Hip extension 0     Hip abduction 30   40  Hip adduction      Hip internal rotation   25   Hip external rotation   45    (Blank rows = not tested) MMT(lb) Right 1/19 Left 1/19 Rt LE  06/21/22 Rt  2/29 Rt 4/10  Hip flexion 6.4 24.8     Hip extension   Can do single leg bridge 10 sec hold     Hip abduction 22.6 30.8 4+/5 31.5   Hip adduction       Hip internal rotation       Hip external rotation       Knee flexion 11.0 20.4  18.2   Knee extension 34.2 33.0  33.0    (Blank rows = not tested)   GAIT: EVAL: TDWB with bil axillary crutches 05/23/22: able to ambulate without AD, locked in neutral for hip extension 06/09/22: gait pattern without antalgia, good ROM and heel strike, no trendelenburg noted   TODAY'S TREATMENT:  4/18   Nu-step: 5 min L5    Manual: Side lying anterior hip stretching; Inferior glide Grade 1 to start progressing to grade II. Roller to anterior hip and gluteal  Double knee to chest with ball 2x10 Ball press and hold 2x15  Ball perturbations x30 sec 2x    2 inch step and hold 2x10  2 inch lateral step 2x10       Treatment                            08/30/22:  Trial of SIJ belt with SLR- PT assisted in pelvic stabilization via ASIS on opp side Primal push ups- 5x7 breath hold Forearm plank 4x5 breaths Seated round back- ball bw knees, [name] to 90, 120, 160 deg flexion STM to Rt quads, HS  and TFL; ktape star to SIJs   4/4 Manual: Side lying anterior hip stretching; Inferior glide Grade 1 to start progressing to grade II. Roller to anterior hip and gluteal  Nu-step 5 min for stretch   Posterior pelvic tilt 2 x 10 Hip abduction with band 2 x 10 red band Bridge 2x10   Standing: Step on Airex forward and lateral x 20 each Standing heel raise x 20  4/2 Manual: Side lying anterior hip stretching; Inferior glide Grade 1 to start progressing to grade II. Roller to anterior hip  Exercise bike 4 minutes.  Patient reports that whenever she rides a bike she has increased pelvic pain in general.  Show exercise bike stop.  No increase anterior hip pain with exercise bike  Posterior pelvic tilt 2 x 10 Hip abduction with band 2 x 10 red band Reviewed bridge for HEP  Long arc quad with red band 2 x 10 each leg  Reviewed patient's HEP see below and clinical impression statement      PATIENT EDUCATION:  Education details: Anatomy of condition, POC, HEP, exercise form/rationale Person educated: Patient and Parent Education method: Explanation, Demonstration, Tactile cues, Verbal cues, and Handouts Education comprehension: verbalized understanding, returned demonstration, verbal cues required, tactile cues required, and needs further education  HOME EXERCISE PROGRAM:  Access Code: [email or web address] URL:  [email or web address]   ASSESSMENT:  CLINICAL IMPRESSION: The patient's hip mobility continues to improve. Her hip continues to be pretty reactive to light activity. She had a significant spasm in her glut med area following low range step ups. We reviewed stepping onto the air-ex for home. She was advised to work it, then work on the muscles to get them to release. We will continue to repeat that process. She was also given stabilization with her feet on the ball and hip at 90 degrees.    OBJECTIVE IMPAIRMENTS: Abnormal gait, decreased activity tolerance, decreased balance, difficulty walking, decreased ROM, decreased strength, increased muscle spasms, impaired flexibility, improper body mechanics, and pain.   ACTIVITY LIMITATIONS: carrying, lifting, bending, sitting, standing, squatting, stairs, transfers, bed mobility, bathing, dressing, and locomotion level  PARTICIPATION LIMITATIONS: meal prep, cleaning, medication management, driving, shopping, community activity, and occupation  PERSONAL FACTORS: 3+ comorbidities: previous labral repair, EDS, autonomic dysfunction  are also affecting patient's functional outcome.     GOALS: Goals reviewed with patient? Yes  SHORT TERM GOALS: Target date: 05/26/22 30s STS without pain Baseline: x12 Goal status: achieved  2.  Able to demo household ambulation without AD and with proper pattern Baseline:  Goal status: achieved    LONG TERM GOALS: Target date: POC date  Able to demo step up on 6" step with level pelvis and proper form Baseline:  Goal status: 06/20/22  2.  Will tolerate at least 3 min on elliptical, demonstrating good tolerance to repetitive weight bearing motion Baseline:  Goal status: achieved Week 8 06/20/22  3.  Demonstrate proper form in at least 10 continuous lunges without increased pain Baseline:  Goal status: ongoing  4.  Demonstrate gentle, double and single foot plyometric motions with good  proximal form Baseline:  Goal status: ongoing  5.  Pt will begin gradual return to group fitness with understanding of modifications and precautions Baseline:  Goal status: ongoing    PLAN:  PT FREQUENCY: 1-2x/week  PT DURATION: 12 weeks  PLANNED INTERVENTIONS: Therapeutic exercises, Therapeutic activity, Neuromuscular re-education, Balance training, Gait training, Patient/Family education, Self Care, Joint mobilization, Stair training, Aquatic Therapy, Dry Needling, Electrical stimulation,  Spinal mobilization, Cryotherapy, Moist heat, Taping, Traction, Ultrasound, Ionotophoresis /ml Dexamethasone, and Manual therapy  PLAN FOR NEXT SESSION: STM to hip musculature PROM with mobs as tolerated. Continue to progress core stability and hip strengthening.   Lorayne Bender PT DPT  08/30/22 11:33 AM

## 2022-09-08 ENCOUNTER — Encounter (HOSPITAL_BASED_OUTPATIENT_CLINIC_OR_DEPARTMENT_OTHER): Payer: Self-pay | Admitting: Physical Therapy

## 2022-09-11 ENCOUNTER — Telehealth: Payer: Self-pay

## 2022-09-11 NOTE — Telephone Encounter (Signed)
Brianna [name]  called:   Per patient her Neurologist at [facility] thinks the Botox should be increased. She is not sure of the dose increase. But would like to speak to you. Call back phone [phone].  Thank you.

## 2022-09-12 ENCOUNTER — Encounter (HOSPITAL_BASED_OUTPATIENT_CLINIC_OR_DEPARTMENT_OTHER): Payer: BC Managed Care – PPO | Admitting: Physical Therapy

## 2022-09-12 ENCOUNTER — Encounter
Payer: BC Managed Care – PPO | Attending: Physical Medicine and Rehabilitation | Admitting: Physical Medicine and Rehabilitation

## 2022-09-12 ENCOUNTER — Encounter (HOSPITAL_BASED_OUTPATIENT_CLINIC_OR_DEPARTMENT_OTHER): Payer: Self-pay

## 2022-09-12 DIAGNOSIS — G43E11 Chronic migraine with aura, intractable, with status migrainosus: Secondary | ICD-10-CM | POA: Diagnosis not present

## 2022-09-12 NOTE — Progress Notes (Signed)
Subjective:    [name], female    DOB: Jun 18, 1995, 27 y.o.   MRN: [id number]  HPI  An audio/video tele-health visit is felt to be the most appropriate encounter for this patient at this time. This is a follow up tele-visit via phone. The patient is at home. MD is at office. Prior to scheduling this appointment, our staff discussed the limitations of evaluation and management by telemedicine and the availability of in-person appointments. The patient expressed understanding and agreed to proceed.   Brianna [name] is a 27 year old woman who presents for f/u of her migraines, hypermobility- related pains, insomnia, palpitations, and dizziness.   1) Chronic nerve pain:  -Pain on average is 6/10 -Pain right now is 3/10 -She uses a heating pad. Alternates ice for short times -She has occasional flare-ups concentrated around her mid-section.  -She is currently on Cymbalta which can help with nerve pain.  -she was started on Gabapentin and felt double vision and tingling on lips.   2) Pelvic floor dysfunction: -She is starting with a new therapist soon.  -Had a pelvic MRI at [facility] and reviewed this with her and was negative.  -she asks if this is contributing to her hip pain.   3) GI symptoms -Struggled with these since she was 27 years old.   4) History of multiple suicidal ideations  5) Spasms: -present throughout her body -her chriopractor may start her on a muscle relaxer.   6) Depression:  -suffered night sweats from Cymbalta.   7) Insomnia:  -sleep has been very poor for a year.  -she feels that pain is contributing -her job ends at 10:30.  -she did not find Nortryptyline  to be very helpful -she has tried Amitriptyline in the past but cannot remember her side effect to it- thinks it may have been grogginess.   8) Anxiety: -she takes clonazepam as needed.  -has been working long hours at night without breaks -received a note saying she can  only do 4 days per week.  9) ADHD: has not found much improvement in focus with Ritalin.   10) Migraines-  -has been following with neurology.Has not found great relief with the Rizatriptan. The Nortriptyline seems to have been making her thoughts more negative.  -is concerned about the side effects of triptans -had benefits from prior Botox but migraines have started to come back now  11) Right hip pain getting worse despite aquatic therapy -she was found to have a labral tear on MRI and has a surgical consultation on Friday.  -had arthroscopy last year -did PT and does not feel she did her best with home exercises because life has been tough -pain has been really bad in the last few weeks, last week was terrible -right now pain is 2-3 -last week it started to feel like how it felt prior to surgery -she had a steroid injection with lidocaine last week and pain felt terribly worse and she had to take it really easy the next day -he ordered an MRI which is on the 11th.   12) Palpitations -she has had since she was a child  13) Dizziness: -she often has symptoms when she stands from a seated position -she tried midodrine but experienced side effects -she does have a blood pressure cuff to check her pressures at home -she usually checks her BP when she is in a seated position.   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       Pain Inventory Average Pain 5 Pain Right Now 6 My pain is constant, sharp, dull, and tingling  In the last 24 hours, has pain interfered with the following? General activity 8 Relation with others 8 Enjoyment of life 10 What TIME of day is your pain at its worst? morning  and evening Sleep (in general) Poor  Pain is worse with: walking, inactivity, and some activites Pain improves with:  heat/ice and pacing activities Relief from Meds: 0      Family History  Problem Relation Age of Onset   Diabetes Mother    Polycystic ovary syndrome Mother    GER disease Father    Heart disease Paternal Uncle    Social History   Socioeconomic History   Marital status: Single    Spouse name: Not on file   Number of children: Not on file   Years of education: Not on file   Highest education level: Not on file  Occupational History   Not on file  Tobacco Use   Smoking status: Former    Types: E-cigarettes, Cigarettes    Quit date: 2021    Years since quitting: 3.3   Smokeless tobacco: Never  Vaping Use   Vaping Use: Former   Substances: Financial trader  Substance and Sexual Activity   Alcohol use: Not Currently   Drug use: Yes    Frequency: 7.0 times per week    Types: Marijuana   Sexual activity: Yes    Birth control/protection: None  Other Topics Concern   Not on file  Social History Narrative   Right handed   Social Determinants of Health   Financial Resource Strain: Not on file  Food Insecurity: Not on file  Transportation Needs: Not on file  Physical Activity: Not on file  Stress: Not on file  Social Connections: Not on file   Past Surgical History:  Procedure Laterality Date   CHOLECYSTECTOMY  05/09/2021   ESOPHAGOGASTRODUODENOSCOPY ENDOSCOPY     03/2020   HIP ARTHROSCOPY Right 04/25/2022   Procedure: RIGHT HIP ARTHROSCOPY WITH LABRAL RECONSTRUCTION, ILIOTIBIAL BAND ALLOGRAFT, PSOAS RELEASE;  Surgeon: [name], MD;  Location: [facility] OR;  Service: Orthopedics;  Laterality: Right;   LABRAL REPAIR Right 03/02/2021   Procedure: RIGHT HIP ARTHROSCOPY WITH LABRAL REPAIR AND PINCE DEBRIDEMENT;  Surgeon: [name], MD;  Location: [facility] OR;  Service: Orthopedics;  Laterality: Right;   SMART PILL PROCEDURE     12/2020   TYMPANOSTOMY TUBE PLACEMENT Bilateral    WISDOM TOOTH EXTRACTION     Past Medical History:  Diagnosis Date   Acute recurrent  pansinusitis 10/21/2019   ADHD (attention deficit hyperactivity disorder)    inattentive   Anemia    had iron infusions in Fall of 2023   Anxiety    Asthma    Bronchitis    Cat allergy due to both airborne and skin contact 05/01/2016   Chronic sore throat 05/01/2016   COVID 2020   Depression    Fibromyalgia    pt denies   Gastroparesis    GERD (gastroesophageal reflux disease)    History of hypotension    taken midodrine in the past- Spring of 2022   Hypermobile Ehlers-Danlos syndrome    Irregular heart rate    Keratosis pilaris    Migraine    PONV (postoperative nausea and vomiting)    "  I can get really cold"   PTSD (post-traumatic stress disorder)    Seasonal allergic rhinitis 03/11/2014   LMP 08/05/2022   Opioid Risk Score:   Fall Risk Score:  `1  Depression screen [facility] 2/9     03/13/2022    2:50 PM 01/12/2022   10:07 AM 02/14/2021   12:13 PM 01/11/2021    9:23 AM 11/02/2020    3:33 PM 10/11/2020   12:31 PM 09/28/2020   11:16 AM  Depression screen PHQ 2/9  Decreased Interest 3 3 1 1 2 1 1   Down, Depressed, Hopeless 3 3 2 1 2 1 1   PHQ - 2 Score 6 6 3 2 4 2 2   Altered sleeping   3  3    Tired, decreased energy   3  3    Change in appetite   3  3    Feeling bad or failure about yourself    2  3    Trouble concentrating   3  3    Moving slowly or fidgety/restless   3  3    Suicidal thoughts  3 1  1     PHQ-9 Score   21  23    Difficult doing work/chores   Very difficult  Extremely dIfficult      Review of Systems  Constitutional:  Negative for diaphoresis and unexpected weight change.  HENT: Negative.    Eyes: Negative.   Respiratory:  Negative for shortness of breath.   Cardiovascular:  Negative for leg swelling.  Gastrointestinal:  Negative for abdominal pain, constipation, diarrhea and nausea.  Endocrine: Negative.   Genitourinary:  Positive for pelvic pain.       Bladder control  Musculoskeletal:  Positive for back pain, gait problem and myalgias.        Pain on right side of head , pain in right and left hand ,   Skin: Negative.   Allergic/Immunologic: Negative.   Neurological:  Negative for dizziness and weakness.       Tingling  Hematological:  Does not bruise/bleed easily.  Psychiatric/Behavioral:  Positive for sleep disturbance and suicidal ideas. Negative for confusion and dysphoric mood. The patient is not nervous/anxious.   All other systems reviewed and are negative.      Objective:   Physical Exam Not performed as patient was seen via phone.     Assessment & Plan:  1) Chronic Pain Syndrome secondary to fibromyalgia -Discussed current symptoms of pain and history of pain.  -Discussed benefits of exercise in reducing pain. -Apply emu oil. -continue Lamotrigine -discussed benefits of minimizing medications.  -Lupus panel ordered.  -RA and ANA reviewed and negative.  -Discussed trigger point injections -Discussed current symptoms of pain and history of pain.  -Discussed benefits of exercise in reducing pain. -Discussed following foods that may reduce pain: 1) Ginger (especially studied for arthritis)- reduce leukotriene production to decrease inflammation 2) Blueberries- high in phytonutrients that decrease inflammation 3) Salmon- marine omega-3s reduce joint swelling and pain 4) Pumpkin seeds- reduce inflammation 5) dark chocolate- reduces inflammation 6) turmeric- reduces inflammation 7) tart cherries - reduce pain and stiffness 8) extra virgin olive oil - its compound olecanthal helps to block prostaglandins  9) chili peppers- can be eaten or applied topically via capsaicin 10) mint- helpful for headache, muscle aches, joint pain, and itching 11) garlic- reduces inflammation  Link to further information on diet for chronic pain: [email or web address]   2) Pelvic floor dysfunction: -continue pelvic floor therapy.  -estrogen, testosterone,  level -provided a link to a pdf of Pete Escogue's musculoskeletal alignment exercises: [email or web address]   3) Depression: -she has been going to therapy twice per week. -had negative response to Cymbalta (night sweats).  -discussed Remeron and Amitriptyline as medication options- she cannot remember why she stopped these in the pasts. Will defer until we see her blood pressure/HR trends.   4) Functional dyspepsia: -refer to gastroenterology for gastric emptying study.  -no benefits from Buspar, stop Buspar.  -discussed diet  5) contraception: -started a NuvaRing a couple of days ago.   6) pain in lower back and legs: -stop Gabapentin  7) insomnia: -did not have results with Amitriptyline  or Nortriptyline - have discontinued both -Try to go outside near sunrise -Get exercise during the day.  -Turn off all devices an hour before bedtime.  -Teas that can benefit: chamomile, valerian root, Brahmi (Bacopa) -Can consider over the counter melatonin, magnesium, and/or L-theanine. Melatonin is an anti-oxidant with multiple health benefits. Magnesium is involved in greater than 300 enzymatic reactions in the body and most of [place] are deficient as our soil is often depleted. There are 7 different types of magnesium- Bioptemizer's is a supplement with all 7 types, and each has unique benefits. Magnesium can also help with constipation and anxiety.  -Pistachios naturally increase the production of melatonin -Cozy Earth bamboo bed sheets are free from toxic chemicals.  -Tart cherry juice or a tart cherry supplement can improve sleep and soreness post-workout      9) Hypotension: discontinued the midodrine. Recommended she check her BP daily while lying down, sitting up, and standing so we can observe for orthostatic hypotension.  10) Palpitations: discussed that could be secondary to dysautonomia which can respond to propanolol or  metoprolol. Will check BP/HR first before trying these medications which can lower BP  11) Migraine -discussed failure of Emgality, Amitriptyline, Topamax, Klonopin, Vistaril, Fish oil -discussed positive response to Botox -discussed plan to use all 200U next visit -discussed nurtec and ubrelvy and their mechanisms of action  Foods to alleviate migraine:  1) dark leafy greens 2) avocado 3) tuna 4) samon and mackerel 5) beans and legumes Supplements that can be helpful: feverfew, B12, and magnesium  Foods to avoid in migraine: 1) Excessive (or irregular timing) coffee 2) red wine 3) aged cheeses 4) chocolate 5) citrus fruits 6) aspartame and other artifical sweeteners 7) yeast 8) MSG (in processed foods) 9) processed and cured meats 10) nuts and certain seeds 11) chicken livers and other organ meats 12) dairy products like buttermilk, sour cream, and yogurt 13) dried fruits like dates, figs, and raisins 14) garlic 15) onions 16) potato chips 17) pickled foods like olives and sauerkraut 18) some fresh fruits like ripe banana, papaya, red plums, raspberries, kiwi, pineapple 19) tomato-based products  Recommend to keep a migraine diary: rate daily the severity of your headache (1-10) and what foods you eat that day to help determine patterns.    5 minutes spent in discussion of her migraines, positive response to botox, discussed using all 200U next visit, discussed Nurtec and Ubrelvy and their mechanisms of action

## 2022-09-14 ENCOUNTER — Ambulatory Visit (HOSPITAL_BASED_OUTPATIENT_CLINIC_OR_DEPARTMENT_OTHER): Payer: BC Managed Care – PPO | Admitting: Physical Therapy

## 2022-09-14 ENCOUNTER — Encounter (HOSPITAL_BASED_OUTPATIENT_CLINIC_OR_DEPARTMENT_OTHER): Payer: Self-pay | Admitting: Physical Therapy

## 2022-09-14 DIAGNOSIS — R262 Difficulty in walking, not elsewhere classified: Secondary | ICD-10-CM

## 2022-09-14 DIAGNOSIS — M6281 Muscle weakness (generalized): Secondary | ICD-10-CM

## 2022-09-14 DIAGNOSIS — M25551 Pain in right hip: Secondary | ICD-10-CM | POA: Diagnosis not present

## 2022-09-14 NOTE — Therapy (Addendum)
OUTPATIENT PHYSICAL THERAPY LOWER EXTREMITY TREATMENT   Patient Name: Brianna [name] MRN: [id number] DOB:Apr 02, 1996, 27 y.o., female Today's Date: 09/14/2022  END OF SESSION:  PT End of Session - 09/14/22 1352     Visit Number 31    Number of Visits 36    Date for PT Re-Evaluation 09/29/22    Authorization Type BCBS    PT Start Time 1350    PT Stop Time 1428    PT Time Calculation (min) 38 min    Activity Tolerance Patient tolerated treatment well    Behavior During Therapy [facility] for tasks assessed/performed                Past Medical History:  Diagnosis Date   Acute recurrent pansinusitis 10/21/2019   ADHD (attention deficit hyperactivity disorder)    inattentive   Anemia    had iron infusions in Fall of 2023   Anxiety    Asthma    Bronchitis    Cat allergy due to both airborne and skin contact 05/01/2016   Chronic sore throat 05/01/2016   COVID 2020   Depression    Fibromyalgia    pt denies   Gastroparesis    GERD (gastroesophageal reflux disease)    History of hypotension    taken midodrine in the past- Spring of 2022   Hypermobile Ehlers-Danlos syndrome    Irregular heart rate    Keratosis pilaris    Migraine    PONV (postoperative nausea and vomiting)    "I can get really cold"   PTSD (post-traumatic stress disorder)    Seasonal allergic rhinitis 03/11/2014   Past Surgical History:  Procedure Laterality Date   CHOLECYSTECTOMY  05/09/2021   ESOPHAGOGASTRODUODENOSCOPY ENDOSCOPY     03/2020   HIP ARTHROSCOPY Right 04/25/2022   Procedure: RIGHT HIP ARTHROSCOPY WITH LABRAL RECONSTRUCTION, ILIOTIBIAL BAND ALLOGRAFT, PSOAS RELEASE;  Surgeon: [name], MD;  Location: [facility] OR;  Service: Orthopedics;  Laterality: Right;   LABRAL REPAIR Right 03/02/2021   Procedure: RIGHT HIP ARTHROSCOPY WITH LABRAL REPAIR AND PINCE DEBRIDEMENT;  Surgeon: [name], MD;  Location: [facility] OR;  Service: Orthopedics;  Laterality: Right;   SMART PILL PROCEDURE      12/2020   TYMPANOSTOMY TUBE PLACEMENT Bilateral    WISDOM TOOTH EXTRACTION     Patient Active Problem List   Diagnosis Date Noted   Cervical adenopathy 06/10/2021   Chronic pruritus 06/10/2021   Exercise-induced asthma 06/09/2021   Chronic venous insufficiency 06/03/2021   Orthostatic lightheadedness 06/03/2021   Purple toe syndrome of both feet 06/03/2021   Autonomic dysfunction 06/03/2021   Tear of right acetabular labrum 04/13/2021   Migraine headache 03/01/2021   Headache 11/30/2020   Positive ANA (antinuclear antibody) 11/15/2020   Fibromyalgia 11/02/2020   Hair loss 11/02/2020   Muscle spasticity 10/22/2020   Hypermobile Ehlers-Danlos syndrome 10/22/2020   Paresthesia of bilateral legs 10/06/2020   Myalgia 10/06/2020   Chronic pelvic pain in female 10/06/2020   Chronic abdominal pain 10/06/2020   Irritable colon 08/02/2020   Pelvic floor dysfunction 08/02/2020   Irritant dermatitis 08/02/2020   Gastroesophageal reflux disease 06/28/2020   Severe recurrent major depression without psychotic features 06/28/2020   Polyarthralgia 06/28/2020   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 06/28/2020   Tachycardia 06/28/2020   History of COVID-19 06/28/2020   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 deficit 06/28/2020   Recurrent major depressive disorder, in full remission 06/28/2020   Atypical chest pain 06/22/2020   Dizziness 06/22/2020  Palpitations 06/14/2020   Anxiety 05/04/2020   Internal and external hemorrhoids without complication 04/29/2020   Nondiabetic gastroparesis 03/03/2020   ETD (Eustachian tube dysfunction), bilateral 10/21/2019   Acne vulgaris 03/11/2014   Mild intermittent asthma without complication 03/11/2014   Seasonal allergies 03/11/2014    REFERRING PROVIDER:  [name], MD     REFERRING DIAG: [phone] (ICD-10-CM) - Tear of right acetabular labrum, subsequent encounter  post op 04/25/22 R hip labral repair  revision with IT band allograft   THERAPY DIAG:  Pain in right hip  Muscle weakness (generalized)  Difficulty in walking, not elsewhere classified  Rationale for Evaluation and Treatment: Rehabilitation  ONSET DATE: DOS 04/25/22  Days since surgery: 142  SUBJECTIVE:   SUBJECTIVE STATEMENT:  Excruciating cramping with ktape that was better after taking it off. Uses hands to lower Rt leg from hip flexion.   PERTINENT HISTORY: EDS PAIN:  Are you having pain? Yes: NPRS scale: 7/10 Pain location: rt hip Pain description: sore/achey, tight, clicks deep- more medial Aggravating factors: movement Relieving factors: ice  PRECAUTIONS:none  WEIGHT BEARING RESTRICTIONS: no longer has   FALLS:  Has patient fallen in last 6 months? 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  PLOF: Independent  PATIENT GOALS: yoga, take dance classes  OBJECTIVE:   PATIENT SURVEYS:  FOTO EVAL: 27 2/2: 59 2/29: 57  POSTURE:WFL on 1/19  PALPATION: 3/14- able to palpate ITB snapping over greater trochanter throughout hip flexion  LOWER EXTREMITY ROM:  Passive ROM Right eval Right 05/23/22 Rt  06/21/22 Rt AROM 2/29 Rt AROM 4/25  Hip flexion 80 90 100 pinch  126 slight ant pinch WFL- snapping in ecc lower  Hip extension 0      Hip abduction 30   40   Hip adduction       Hip internal rotation   25    Hip external rotation   45     (Blank rows = not tested) MMT(lb) Right 1/19 Left 1/19 Rt LE  06/21/22 Rt  2/29 Rt   Hip flexion 6.4 24.8     Hip extension   Can do single leg bridge 10 sec hold     Hip abduction 22.6 30.8 4+/5 31.5   Hip adduction       Hip internal rotation       Hip external rotation       Knee flexion 11.0 20.4  18.2   Knee extension 34.2 33.0  33.0    (Blank rows = not tested)   GAIT: EVAL: TDWB with bil axillary crutches 05/23/22: able to ambulate without AD, locked in neutral for hip extension 06/09/22: gait pattern without antalgia, good ROM and heel strike, no  trendelenburg noted   TODAY'S TREATMENT:  Treatment                            4/25:  IASTM bil HS Prone Rt inflare release with towel roll Qped- hip ext with knee flexed; hip ER/IR at end range hip ext with knee flexed IASTM lateral hip, ITB, quads, hip flexors Iliacus release in supine Supine hip ER- single leg butterfly stretch   4/18   Nu-step: 5 min L5   Manual: Side lying anterior hip stretching; Inferior glide Grade 1 to start progressing to grade II. Roller to anterior hip and gluteal  Double knee to chest with ball 2x10 Ball press and hold 2x15  Ball perturbations x30 sec 2x  2 inch step and hold 2x10  2 inch lateral step 2x10       Treatment                            08/30/22:  Trial of SIJ belt with SLR- PT assisted in pelvic stabilization via ASIS on opp side Primal push ups- 5x7 breath hold Forearm plank 4x5 breaths Seated round back- ball bw knees, shoulders to 90, 120, 160 deg flexion STM to Rt quads, HS and TFL; ktape star to SIJs     PATIENT EDUCATION:  Education details: Teacher, music of condition, POC, HEP, exercise form/rationale Person educated: Patient and Parent Education method: Explanation, Demonstration, Tactile cues, Verbal cues, and Handouts Education comprehension: verbalized understanding, returned demonstration, verbal cues required, tactile cues required, and needs further education  HOME EXERCISE PROGRAM:  Access Code: [email or web address] URL: [email or web address]   ASSESSMENT:  CLINICAL IMPRESSION: Pt demo good ROM in hip joint with continued snapping on eccentric lower from flexion at anterior hip. Inflare correction exercise and adaption of hip flexor stretch to encourage iliacus length. Decreased snapping upon testing after manual therapy but pt reported it still felt "the same"   did not tolerate ktape for SIJ stability so will continue to focus on muscular activation for support.    OBJECTIVE IMPAIRMENTS: Abnormal  gait, decreased activity tolerance, decreased balance, difficulty walking, decreased ROM, decreased strength, increased muscle spasms, impaired flexibility, improper body mechanics, and pain.   ACTIVITY LIMITATIONS: carrying, lifting, bending, sitting, standing, squatting, stairs, transfers, bed mobility, bathing, dressing, and locomotion level  PARTICIPATION LIMITATIONS: meal prep, cleaning, medication management, driving, shopping, community activity, and occupation  PERSONAL FACTORS: 3+ comorbidities: previous labral repair, EDS, autonomic dysfunction  are also affecting patient's functional outcome.     GOALS: Goals reviewed with patient? Yes  SHORT TERM GOALS: Target date: 05/26/22 30s STS without pain Baseline: x12 Goal status: achieved  2.  Able to demo household ambulation without AD and with proper pattern Baseline:  Goal status: achieved    LONG TERM GOALS: Target date: POC date  Able to demo step up on 6" step with level pelvis and proper form Baseline:  Goal status: 06/20/22  2.  Will tolerate at least 3 min on elliptical, demonstrating good tolerance to repetitive weight bearing motion Baseline:  Goal status: achieved Week 8 06/20/22  3.  Demonstrate proper form in at least 10 continuous lunges without increased pain Baseline:  Goal status: ongoing  4.  Demonstrate gentle, double and single foot plyometric motions with good proximal form Baseline:  Goal status: ongoing  5.  Pt will begin gradual return to group fitness with understanding of modifications and precautions Baseline:  Goal status: ongoing    PLAN:  PT FREQUENCY: 1-2x/week  PT DURATION: 12 weeks  PLANNED INTERVENTIONS: Therapeutic exercises, Therapeutic activity, Neuromuscular re-education, Balance training, Gait training, Patient/Family education, Self Care, Joint mobilization, Stair training, Aquatic Therapy, Dry Needling, Electrical stimulation, Spinal mobilization, Cryotherapy, Moist  heat, Taping, Traction, Ultrasound, Ionotophoresis /ml Dexamethasone, and Manual therapy  PLAN FOR NEXT SESSION: STM to hip musculature PROM with mobs as tolerated. Continue to progress core stability and hip strengthening.   Ottilie Wigglesworth C. Gabbie Marzo PT, DPT 09/14/22 2:33 PM

## 2022-09-14 NOTE — Addendum Note (Signed)
Addended by: Fredrich Romans on: 09/14/2022 02:46 PM   Modules accepted: Orders

## 2022-09-19 ENCOUNTER — Encounter (HOSPITAL_BASED_OUTPATIENT_CLINIC_OR_DEPARTMENT_OTHER): Payer: BC Managed Care – PPO | Admitting: Physical Therapy

## 2022-09-20 ENCOUNTER — Ambulatory Visit (HOSPITAL_BASED_OUTPATIENT_CLINIC_OR_DEPARTMENT_OTHER): Payer: BC Managed Care – PPO | Admitting: Orthopaedic Surgery

## 2022-09-20 ENCOUNTER — Encounter (HOSPITAL_BASED_OUTPATIENT_CLINIC_OR_DEPARTMENT_OTHER): Payer: Self-pay | Admitting: Orthopaedic Surgery

## 2022-09-20 DIAGNOSIS — M25551 Pain in right hip: Secondary | ICD-10-CM

## 2022-09-20 DIAGNOSIS — S73191A Other sprain of right hip, initial encounter: Secondary | ICD-10-CM

## 2022-09-20 MED ORDER — LIDOCAINE HCL 1 % IJ SOLN
4.0000 mL | INTRAMUSCULAR | Status: AC | PRN
Start: 2022-09-20 — End: 2022-09-20
  Administered 2022-09-20: 4 mL

## 2022-09-20 MED ORDER — TRIAMCINOLONE ACETONIDE 40 MG/ML IJ SUSP
80.0000 mg | INTRAMUSCULAR | Status: AC | PRN
Start: 2022-09-20 — End: 2022-09-20
  Administered 2022-09-20: 80 mg via INTRA_ARTICULAR

## 2022-09-20 NOTE — Progress Notes (Signed)
Post Operative Evaluation    Procedure/Date of Surgery: Right hip arthroscopy with labral reconstruction 12/5  Interval History:   09/20/2022: Presents today for follow-up status post right hip arthroscopy.  She has been having internal hip popping which is quite frustrating to her.  This is occurring deep in the hip.  This has been occasionally painful for her.  PMH/PSH/Family History/Social History/Meds/Allergies:    Past Medical History:  Diagnosis Date  . Acute recurrent pansinusitis 10/21/2019  . ADHD (attention deficit hyperactivity disorder)    inattentive  . Anemia    had iron infusions in Fall of 2023  . Anxiety   . Asthma   . Bronchitis   . Cat allergy due to both airborne and skin contact 05/01/2016  . Chronic sore throat 05/01/2016  . COVID 2020  . Depression   . Fibromyalgia    pt denies  . Gastroparesis   . GERD (gastroesophageal reflux disease)   . History of hypotension    taken midodrine in the past- Spring of 2022  . Hypermobile Ehlers-Danlos syndrome   . Irregular heart rate   . Keratosis pilaris   . Migraine   . PONV (postoperative nausea and vomiting)    "I can get really cold"  . PTSD (post-traumatic stress disorder)   . Seasonal allergic rhinitis 03/11/2014   Past Surgical History:  Procedure Laterality Date  . CHOLECYSTECTOMY  05/09/2021  . ESOPHAGOGASTRODUODENOSCOPY ENDOSCOPY     03/2020  . HIP ARTHROSCOPY Right 04/25/2022   Procedure: RIGHT HIP ARTHROSCOPY WITH LABRAL RECONSTRUCTION, ILIOTIBIAL BAND ALLOGRAFT, PSOAS RELEASE;  Surgeon: [name], MD;  Location: [facility] OR;  Service: Orthopedics;  Laterality: Right;  . LABRAL REPAIR Right 03/02/2021   Procedure: RIGHT HIP ARTHROSCOPY WITH LABRAL REPAIR AND PINCE DEBRIDEMENT;  Surgeon: [name], MD;  Location: [facility] OR;  Service: Orthopedics;  Laterality: Right;  . SMART PILL PROCEDURE     12/2020  . TYMPANOSTOMY TUBE PLACEMENT Bilateral   . WISDOM TOOTH  EXTRACTION     Social History   Socioeconomic History  . Marital status: Single    Spouse name: Not on file  . Number of children: Not on file  . Years of education: Not on file  . Highest education level: Not on file  Occupational History  . Not on file  Tobacco Use  . Smoking status: Former    Types: E-cigarettes, Cigarettes    Quit date: 2021    Years since quitting: 3.3  . Smokeless tobacco: Never  Vaping Use  . Vaping Use: Former  . Substances: Synthetic cannabinoids  Substance and Sexual Activity  . Alcohol use: Not Currently  . Drug use: Yes    Frequency: 7.0 times per week    Types: Marijuana  . Sexual activity: Yes    Birth control/protection: None  Other Topics Concern  . Not on file  Social History Narrative   Right handed   Social Determinants of Health   Financial Resource Strain: Not on file  Food Insecurity: Not on file  Transportation Needs: Not on file  Physical Activity: Not on file  Stress: Not on file  Social Connections: Not on file   Family History  Problem Relation Age of Onset  . Diabetes Mother   . Polycystic ovary syndrome Mother   . GER disease Father   .  Heart disease Paternal Uncle    Allergies  Allergen Reactions  . Clindamycin Hives, Rash and Swelling    Other reaction(s): swelling  . Tretinoin Dermatitis, Itching, Photosensitivity, Swelling and Other (See Comments)   Current Outpatient Medications  Medication Sig Dispense Refill  . Dextromethorphan-buPROPion ER (AUVELITY) 45-105 MG TBCR Take 1 tablet by mouth daily.    Marland Kitchen loratadine (CLARITIN) 10 MG tablet Take 10 mg by mouth daily.    . naratriptan (AMERGE) 2.5 MG tablet Take 2.5 mg by mouth as needed for migraine.    . Omega-3 Fatty Acids (FISH OIL PO) Take 1,400 Units by mouth daily. Mini    . ondansetron (ZOFRAN ODT) 4 MG disintegrating tablet Take 1 tablet (4 mg total) by mouth every 8 (eight) hours as needed for nausea or vomiting. 20 tablet 5  . Pediatric Multiple  Vitamins (PEDIATRIC MULTIVITAMIN) chewable tablet Chew 1 tablet by mouth daily.    . promethazine (PHENERGAN) 25 MG tablet Take 1 tablet (25 mg total) by mouth every 6 (six) hours as needed for nausea or vomiting. (Patient taking differently: Take 12.5 mg by mouth every 6 (six) hours as needed for nausea or vomiting.) 30 tablet 0  . tazarotene (TAZORAC) 0.05 % cream Apply 1 Application topically every other day.    . triamcinolone cream (KENALOG) 0.1 % Apply 1 Application topically daily as needed (Eczma).     No current facility-administered medications for this visit.   No results found.  Review of Systems:   A ROS was performed including pertinent positives and negatives as documented in the HPI.   Musculoskeletal Exam:    Right hip incisions are healed.  Range of motion is 30 degrees internal rotation and 35 degrees external rotation without significant pain.  Tenderness deep in the hip with a palpable click with flexion of the hip and the laying position  Imaging:    None  I personally reviewed and interpreted the radiographs.   Assessment:   27 year old female status post right hip arthroscopic labral reconstruction.  At today's visit she is having persistent internal popping which I do believe may be psoas irritation.  Today's visit I did recommend an ultrasound-guided injection of the psoas.  Will plan to proceed with this.  I did discuss that should she not get significant relief from this.  I would potentially recommend additional second opinion with Dr. Aundria Rud.  She will send [place] a MyChart message should she want to pursue this Plan :    -Right hip ultrasound-guided psoas injection performed after verbal consent obtained    Procedure Note  Patient: Brianna [name]             Date of Birth: 03/19/1996           MRN: [id number]             Visit Date: 09/20/2022  Procedures: Visit Diagnoses: No diagnosis found.  Large Joint Inj: R hip joint on 09/20/2022 5:01  PM Indications: pain Details: 22 G 3.5 in needle, ultrasound-guided anterolateral approach  Arthrogram: No  Medications: 4 mL lidocaine 1 %; 80 mg triamcinolone acetonide 40 MG/ML Outcome: tolerated well, no immediate complications Procedure, treatment alternatives, risks and benefits explained, specific risks discussed. Consent was given by the patient. Immediately prior to procedure a time out was called to verify the correct patient, procedure, equipment, support staff and site/side marked as required. Patient was prepped and draped in the usual sterile fashion.      I personally saw and  evaluated the patient, and participated in the management and treatment plan.  Vanetta Mulders, MD Attending Physician, Orthopedic Surgery  This document was dictated using Dragon voice recognition software. A reasonable attempt at proof reading has been made to minimize errors.

## 2022-09-21 ENCOUNTER — Encounter (HOSPITAL_BASED_OUTPATIENT_CLINIC_OR_DEPARTMENT_OTHER): Payer: Self-pay | Admitting: Physical Therapy

## 2022-09-21 ENCOUNTER — Ambulatory Visit (HOSPITAL_BASED_OUTPATIENT_CLINIC_OR_DEPARTMENT_OTHER): Payer: BC Managed Care – PPO | Attending: Orthopaedic Surgery | Admitting: Physical Therapy

## 2022-09-21 DIAGNOSIS — R262 Difficulty in walking, not elsewhere classified: Secondary | ICD-10-CM

## 2022-09-21 DIAGNOSIS — M6281 Muscle weakness (generalized): Secondary | ICD-10-CM

## 2022-09-21 DIAGNOSIS — M25551 Pain in right hip: Secondary | ICD-10-CM

## 2022-09-21 NOTE — Therapy (Signed)
OUTPATIENT PHYSICAL THERAPY LOWER EXTREMITY TREATMENT   Patient Name: Brianna [name] MRN: [id number] DOB:Dec 10, 1995, 27 y.o., female Today's Date: 09/21/2022  END OF SESSION:  PT End of Session - 09/21/22 1615     Visit Number 32    Number of Visits 36    Date for PT Re-Evaluation 09/29/22    Authorization Type BCBS    PT Start Time 1610    PT Stop Time 1730    PT Time Calculation (min) 80 min    Activity Tolerance --    Behavior During Therapy --                Past Medical History:  Diagnosis Date   Acute recurrent pansinusitis 10/21/2019   ADHD (attention deficit hyperactivity disorder)    inattentive   Anemia    had iron infusions in Fall of 2023   Anxiety    Asthma    Bronchitis    Cat allergy due to both airborne and skin contact 05/01/2016   Chronic sore throat 05/01/2016   COVID 2020   Depression    Fibromyalgia    pt denies   Gastroparesis    GERD (gastroesophageal reflux disease)    History of hypotension    taken midodrine in the past- Spring of 2022   Hypermobile Ehlers-Danlos syndrome    Irregular heart rate    Keratosis pilaris    Migraine    PONV (postoperative nausea and vomiting)    "I can get really cold"   PTSD (post-traumatic stress disorder)    Seasonal allergic rhinitis 03/11/2014   Past Surgical History:  Procedure Laterality Date   CHOLECYSTECTOMY  05/09/2021   ESOPHAGOGASTRODUODENOSCOPY ENDOSCOPY     03/2020   HIP ARTHROSCOPY Right 04/25/2022   Procedure: RIGHT HIP ARTHROSCOPY WITH LABRAL RECONSTRUCTION, ILIOTIBIAL BAND ALLOGRAFT, PSOAS RELEASE;  Surgeon: [name], MD;  Location: [facility];  Service: Orthopedics;  Laterality: Right;   LABRAL REPAIR Right 03/02/2021   Procedure: RIGHT HIP ARTHROSCOPY WITH LABRAL REPAIR AND PINCE DEBRIDEMENT;  Surgeon: [name], MD;  Location: [facility];  Service: Orthopedics;  Laterality: Right;   SMART PILL PROCEDURE     12/2020   TYMPANOSTOMY TUBE PLACEMENT Bilateral     WISDOM TOOTH EXTRACTION     Patient Active Problem List   Diagnosis Date Noted   Cervical adenopathy 06/10/2021   Chronic pruritus 06/10/2021   Exercise-induced asthma 06/09/2021   Chronic venous insufficiency 06/03/2021   Orthostatic lightheadedness 06/03/2021   Purple toe syndrome of both feet (HCC) 06/03/2021   Autonomic dysfunction 06/03/2021   Tear of right acetabular labrum 04/13/2021   Migraine headache 03/01/2021   Headache 11/30/2020   Positive ANA (antinuclear antibody) 11/15/2020   Fibromyalgia 11/02/2020   Hair loss 11/02/2020   Muscle spasticity 10/22/2020   Hypermobile Ehlers-Danlos syndrome 10/22/2020   Paresthesia of bilateral legs 10/06/2020   Myalgia 10/06/2020   Chronic pelvic pain in female 10/06/2020   Chronic abdominal pain 10/06/2020   Irritable colon 08/02/2020   Pelvic floor dysfunction 08/02/2020   Irritant dermatitis 08/02/2020   Gastroesophageal reflux disease 06/28/2020   Severe recurrent major depression without psychotic features (HCC) 06/28/2020   Polyarthralgia 06/28/2020   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 06/28/2020   Tachycardia 06/28/2020   History of COVID-19 06/28/2020   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 deficit 06/28/2020   Recurrent major depressive disorder, in full remission (HCC) 06/28/2020   Atypical chest pain 06/22/2020   Dizziness 06/22/2020   Palpitations 06/14/2020  Anxiety 05/04/2020   Internal and external hemorrhoids without complication 04/29/2020   Nondiabetic gastroparesis 03/03/2020   ETD (Eustachian tube dysfunction), bilateral 10/21/2019   Acne vulgaris 03/11/2014   Mild intermittent asthma without complication 03/11/2014   Seasonal allergies 03/11/2014    REFERRING PROVIDER:  [name], MD     REFERRING DIAG: [phone] (ICD-10-CM) - Tear of right acetabular labrum, subsequent encounter  post op 04/25/22 R hip labral repair revision with IT band allograft    THERAPY DIAG:  Pain in right hip  Muscle weakness (generalized)  Difficulty in walking, not elsewhere classified  Rationale for Evaluation and Treatment: Rehabilitation  ONSET DATE: DOS 04/25/22  Days since surgery: 149  SUBJECTIVE:   SUBJECTIVE STATEMENT:  Still having to use hand to lower leg.  PERTINENT HISTORY: EDS PAIN:  Are you having pain? Yes: NPRS scale: 7/10 Pain location: rt hip Pain description: sore/achey, tight, clicks deep- more medial Aggravating factors: movement Relieving factors: ice  PRECAUTIONS:none  WEIGHT BEARING RESTRICTIONS: no longer has   FALLS:  Has patient fallen in last 6 months? 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  PLOF: Independent  PATIENT GOALS: yoga, take dance classes  OBJECTIVE:   PATIENT SURVEYS:  FOTO EVAL: 27 2/2: 59 2/29: 57  POSTURE:WFL on 1/19  PALPATION: 3/14- able to palpate ITB snapping over greater trochanter throughout hip flexion  LOWER EXTREMITY ROM:  Passive ROM Right eval Right 05/23/22 Rt  06/21/22 Rt AROM 2/29 Rt AROM 4/25  Hip flexion 80 90 100 pinch  126 slight ant pinch WFL- snapping in ecc lower  Hip extension 0      Hip abduction 30   40   Hip adduction       Hip internal rotation   25    Hip external rotation   45     (Blank rows = not tested) MMT(lb) Right 1/19 Left 1/19 Rt LE  06/21/22 Rt  2/29 Rt   Hip flexion 6.4 24.8     Hip extension   Can do single leg bridge 10 sec hold     Hip abduction 22.6 30.8 4+/5 31.5   Hip adduction       Hip internal rotation       Hip external rotation       Knee flexion 11.0 20.4  18.2   Knee extension 34.2 33.0  33.0    (Blank rows = not tested)   GAIT: EVAL: TDWB with bil axillary crutches 05/23/22: able to ambulate without AD, locked in neutral for hip extension 06/09/22: gait pattern without antalgia, good ROM and heel strike, no trendelenburg noted   TODAY'S TREATMENT:  Treatment                            4/25:  IASTM bil  HS Prone Rt inflare release with towel roll Qped- hip ext with knee flexed; hip ER/IR at end range hip ext with knee flexed IASTM lateral hip, ITB, quads, hip flexors Iliacus release in supine Supine hip ER- single leg butterfly stretch   4/18   Nu-step: 5 min L5   Manual: Side lying anterior hip stretching; Inferior glide Grade 1 to start progressing to grade II. Roller to anterior hip and gluteal  Double knee to chest with ball 2x10 Ball press and hold 2x15  Ball perturbations x30 sec 2x    2 inch step and hold 2x10  2 inch lateral step 2x10  Treatment                            08/30/22:  Trial of SIJ belt with SLR- PT assisted in pelvic stabilization via ASIS on opp side Primal push ups- 5x7 breath hold Forearm plank 4x5 breaths Seated round back- ball bw knees, shoulders to 90, 120, 160 deg flexion STM to Rt quads, HS and TFL; ktape star to SIJs     PATIENT EDUCATION:  Education details: Teacher, music of condition, POC, HEP, exercise form/rationale Person educated: Patient and Parent Education method: Explanation, Demonstration, Tactile cues, Verbal cues, and Handouts Education comprehension: verbalized understanding, returned demonstration, verbal cues required, tactile cues required, and needs further education  HOME EXERCISE PROGRAM:  Access Code: [email or web address] URL: [email or web address]   ASSESSMENT:  CLINICAL IMPRESSION: 09/21/22: Pt arrived to PT today very upset and in what she described as a mental crisis. We tried to call multiple resource lines through her current mental health providers without contact made. Pt was screened (see below) and I strongly encouraged her to report to the ED which she denied AMA. Ultimately we settled on calling her Mom to meet her at her home and pt reported she was fine to drive. I asked that she send me a message when she arrives home, which she did. Has an appt with her therapist tomorrow afternoon.    OBJECTIVE  IMPAIRMENTS: Abnormal gait, decreased activity tolerance, decreased balance, difficulty walking, decreased ROM, decreased strength, increased muscle spasms, impaired flexibility, improper body mechanics, and pain.   ACTIVITY LIMITATIONS: carrying, lifting, bending, sitting, standing, squatting, stairs, transfers, bed mobility, bathing, dressing, and locomotion level  PARTICIPATION LIMITATIONS: meal prep, cleaning, medication management, driving, shopping, community activity, and occupation  PERSONAL FACTORS: 3+ comorbidities: previous labral repair, EDS, autonomic dysfunction  are also affecting patient's functional outcome.     GOALS: Goals reviewed with patient? Yes  SHORT TERM GOALS: Target date: 05/26/22 30s STS without pain Baseline: x12 Goal status: achieved  2.  Able to demo household ambulation without AD and with proper pattern Baseline:  Goal status: achieved    LONG TERM GOALS: Target date: POC date  Able to demo step up on 6" step with level pelvis and proper form Baseline:  Goal status: 06/20/22  2.  Will tolerate at least 3 min on elliptical, demonstrating good tolerance to repetitive weight bearing motion Baseline:  Goal status: achieved Week 8 06/20/22  3.  Demonstrate proper form in at least 10 continuous lunges without increased pain Baseline:  Goal status: ongoing  4.  Demonstrate gentle, double and single foot plyometric motions with good proximal form Baseline:  Goal status: ongoing  5.  Pt will begin gradual return to group fitness with understanding of modifications and precautions Baseline:  Goal status: ongoing    PLAN:  PT FREQUENCY: 1-2x/week  PT DURATION: 12 weeks  PLANNED INTERVENTIONS: Therapeutic exercises, Therapeutic activity, Neuromuscular re-education, Balance training, Gait training, Patient/Family education, Self Care, Joint mobilization, Stair training, Aquatic Therapy, Dry Needling, Electrical stimulation, Spinal mobilization,  Cryotherapy, Moist heat, Taping, Traction, Ultrasound, Ionotophoresis 4mg /ml Dexamethasone, and Manual therapy  PLAN FOR NEXT SESSION: STM to hip musculature PROM with mobs as tolerated. Continue to progress core stability and hip strengthening.   Royce Sciara C. Mariell Nester PT, DPT 09/21/22 8:50 PM  Screening for Suicide  Answer the following questions with Yes or No and place an "x" beside the action taken.  1. Over the past two weeks, have you  felt down, depressed, or hopeless?   YES  2. Within the past two weeks, have you felt little interest or pleasure in life?  YES  If YES to either #1 or #2, then ask #3  3. Have you had thoughts that that life is not worth living or that you might be       better off dead?   YES  If answer is NO and suspicion is low, then end   4. Over this past week, have you had any thoughts about hurting or even killing yourself?  YES  If NO, then end. Patient in no immediate danger   5. If so, do you believe that you intend to or will harm yourself?  NO     If NO, then end. Patient in no immediate danger   6.  Do you have a plan as to how you would hurt yourself?  NO   7.  Over this past week, have you actually done anything to hurt yourself? NO   IF YES answers to either #4, #5, #6 or #7, then patient is AT RISK for suicide   Actions Taken  ____  Screening negative; no further action required  __x__  Screening positive; no immediate danger and patient already in treatment with a  mental health provider. Advise patient to speak to their mental health provider.  ____  Screening positive; no immediate danger. Patient advised to contact a mental  health provider for further assessment.   ____  Screening positive; in immediate danger as patient states intention of killing self,  has plan and a sense of imminence. Do not leave alone. Seek permission from  patient to contact a family member to inform them. Direct patient to go to ED.

## 2022-09-22 ENCOUNTER — Encounter (HOSPITAL_BASED_OUTPATIENT_CLINIC_OR_DEPARTMENT_OTHER): Payer: Self-pay | Admitting: Orthopaedic Surgery

## 2022-09-22 ENCOUNTER — Encounter (HOSPITAL_BASED_OUTPATIENT_CLINIC_OR_DEPARTMENT_OTHER): Payer: BC Managed Care – PPO | Admitting: Physical Therapy

## 2022-09-25 ENCOUNTER — Telehealth: Payer: Self-pay | Admitting: Orthopaedic Surgery

## 2022-09-25 ENCOUNTER — Ambulatory Visit (HOSPITAL_BASED_OUTPATIENT_CLINIC_OR_DEPARTMENT_OTHER): Payer: BC Managed Care – PPO | Admitting: Physical Therapy

## 2022-09-25 ENCOUNTER — Encounter (HOSPITAL_BASED_OUTPATIENT_CLINIC_OR_DEPARTMENT_OTHER): Payer: Self-pay | Admitting: Physical Therapy

## 2022-09-25 ENCOUNTER — Other Ambulatory Visit (HOSPITAL_BASED_OUTPATIENT_CLINIC_OR_DEPARTMENT_OTHER): Payer: Self-pay | Admitting: Orthopaedic Surgery

## 2022-09-25 DIAGNOSIS — R262 Difficulty in walking, not elsewhere classified: Secondary | ICD-10-CM | POA: Diagnosis present

## 2022-09-25 DIAGNOSIS — M6281 Muscle weakness (generalized): Secondary | ICD-10-CM

## 2022-09-25 DIAGNOSIS — S73191A Other sprain of right hip, initial encounter: Secondary | ICD-10-CM

## 2022-09-25 DIAGNOSIS — M25551 Pain in right hip: Secondary | ICD-10-CM

## 2022-09-25 NOTE — Telephone Encounter (Signed)
Patient called advised she sent a message to [place] in the portal and asked that she read it and respond back to her. The number to contact patient is [phone]

## 2022-09-25 NOTE — Telephone Encounter (Signed)
Per sabrina she will work on it soon. She has referrals ahead of hers she states she is working on. Lvm for pt advising to give to Wednesday.

## 2022-09-25 NOTE — Telephone Encounter (Signed)
Message sent to sabrina to check on status of referral

## 2022-09-25 NOTE — Therapy (Signed)
OUTPATIENT PHYSICAL THERAPY LOWER EXTREMITY TREATMENT   Patient Name: Brianna [name] MRN: [id number] DOB:05/04/1996, 27 y.o., female Today's Date: 09/25/2022  END OF SESSION:  PT End of Session - 09/25/22 1603     Visit Number 33    Number of Visits 36    Date for PT Re-Evaluation 09/29/22    Authorization Type BCBS    PT Start Time 1602    PT Stop Time 1647    PT Time Calculation (min) 45 min    Activity Tolerance Patient tolerated treatment well    Behavior During Therapy [facility] for tasks assessed/performed                 Past Medical History:  Diagnosis Date   Acute recurrent pansinusitis 10/21/2019   ADHD (attention deficit hyperactivity disorder)    inattentive   Anemia    had iron infusions in Fall of 2023   Anxiety    Asthma    Bronchitis    Cat allergy due to both airborne and skin contact 05/01/2016   Chronic sore throat 05/01/2016   COVID 2020   Depression    Fibromyalgia    pt denies   Gastroparesis    GERD (gastroesophageal reflux disease)    History of hypotension    taken midodrine in the past- Spring of 2022   Hypermobile Ehlers-Danlos syndrome    Irregular heart rate    Keratosis pilaris    Migraine    PONV (postoperative nausea and vomiting)    "I can get really cold"   PTSD (post-traumatic stress disorder)    Seasonal allergic rhinitis 03/11/2014   Past Surgical History:  Procedure Laterality Date   CHOLECYSTECTOMY  05/09/2021   ESOPHAGOGASTRODUODENOSCOPY ENDOSCOPY     03/2020   HIP ARTHROSCOPY Right 04/25/2022   Procedure: RIGHT HIP ARTHROSCOPY WITH LABRAL RECONSTRUCTION, ILIOTIBIAL BAND ALLOGRAFT, PSOAS RELEASE;  Surgeon: [name], MD;  Location: [facility] OR;  Service: Orthopedics;  Laterality: Right;   LABRAL REPAIR Right 03/02/2021   Procedure: RIGHT HIP ARTHROSCOPY WITH LABRAL REPAIR AND PINCE DEBRIDEMENT;  Surgeon: [name], MD;  Location: [facility] OR;  Service: Orthopedics;  Laterality: Right;   SMART PILL PROCEDURE      12/2020   TYMPANOSTOMY TUBE PLACEMENT Bilateral    WISDOM TOOTH EXTRACTION     Patient Active Problem List   Diagnosis Date Noted   Cervical adenopathy 06/10/2021   Chronic pruritus 06/10/2021   Exercise-induced asthma 06/09/2021   Chronic venous insufficiency 06/03/2021   Orthostatic lightheadedness 06/03/2021   Purple toe syndrome of both feet (HCC) 06/03/2021   Autonomic dysfunction 06/03/2021   Tear of right acetabular labrum 04/13/2021   Migraine headache 03/01/2021   Headache 11/30/2020   Positive ANA (antinuclear antibody) 11/15/2020   Fibromyalgia 11/02/2020   Hair loss 11/02/2020   Muscle spasticity 10/22/2020   Hypermobile Ehlers-Danlos syndrome 10/22/2020   Paresthesia of bilateral legs 10/06/2020   Myalgia 10/06/2020   Chronic pelvic pain in female 10/06/2020   Chronic abdominal pain 10/06/2020   Irritable colon 08/02/2020   Pelvic floor dysfunction 08/02/2020   Irritant dermatitis 08/02/2020   Gastroesophageal reflux disease 06/28/2020   Severe recurrent major depression without psychotic features (HCC) 06/28/2020   Polyarthralgia 06/28/2020   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 06/28/2020   Tachycardia 06/28/2020   History of COVID-19 06/28/2020   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 deficit 06/28/2020   Recurrent major depressive disorder, in full remission (HCC) 06/28/2020   Atypical chest pain 06/22/2020  Dizziness 06/22/2020   Palpitations 06/14/2020   Anxiety 05/04/2020   Internal and external hemorrhoids without complication 04/29/2020   Nondiabetic gastroparesis 03/03/2020   ETD (Eustachian tube dysfunction), bilateral 10/21/2019   Acne vulgaris 03/11/2014   Mild intermittent asthma without complication 03/11/2014   Seasonal allergies 03/11/2014    REFERRING PROVIDER:  [name], MD     REFERRING DIAG: [phone] (ICD-10-CM) - Tear of right acetabular labrum, subsequent encounter  post op 04/25/22 R  hip labral repair revision with IT band allograft   THERAPY DIAG:  Pain in right hip  Muscle weakness (generalized)  Difficulty in walking, not elsewhere classified  Rationale for Evaluation and Treatment: Rehabilitation  ONSET DATE: DOS 04/25/22  Days since surgery: 153  SUBJECTIVE:   SUBJECTIVE STATEMENT:  Referral going out to Dr Aundria Rud. Hip pops with every step if LE goes into extension at toe off.   PERTINENT HISTORY: EDS PAIN:  Are you having pain? Yes: NPRS scale: 7/10 Pain location: rt hip Pain description: sore/achey, tight, clicks deep- more medial Aggravating factors: movement Relieving factors: ice  PRECAUTIONS:none  WEIGHT BEARING RESTRICTIONS: no longer has   FALLS:  Has patient fallen in last 6 months? 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  PLOF: Independent  PATIENT GOALS: yoga, take dance classes  OBJECTIVE:   PATIENT SURVEYS:  FOTO EVAL: 27 2/2: 59 2/29: 57 5/6: 52   PALPATION: 3/14- able to palpate ITB snapping over greater trochanter throughout hip flexion  LOWER EXTREMITY ROM:  Passive ROM Right eval Right 05/23/22 Rt  06/21/22 Rt AROM 2/29 Rt AROM 4/25  Hip flexion 80 90 100 pinch  126 slight ant pinch WFL- snapping in ecc lower  Hip extension 0      Hip abduction 30   40   Hip adduction       Hip internal rotation   25    Hip external rotation   45     (Blank rows = not tested) MMT(lb) Right 1/19 Left 1/19 Rt LE  06/21/22 Rt  2/29 Rt 5/6 Tindeq  Hip flexion 6.4 24.8     Hip extension   Can do single leg bridge 10 sec hold     Hip abduction 22.6 30.8 4+/5 31.5 16.8 lb standing  Hip adduction       Hip internal rotation       Hip external rotation       Knee flexion 11.0 20.4  18.2 17.6 lb  Knee extension 34.2 33.0  33.0 29 lb   (Blank rows = not tested)   GAIT: EVAL: TDWB with bil axillary crutches 05/23/22: able to ambulate without AD, locked in neutral for hip extension 06/09/22: gait pattern without antalgia,  good ROM and heel strike, no trendelenburg noted 5/6: no trendelenburg, able to demo proper form in gait but avoids extension due to frequent cavitations   TODAY'S TREATMENT:   Treatment                            5/6:  MANUAL & IASTM to Rt hip and quads, ITB   Treatment                            4/25:  IASTM bil HS Prone Rt inflare release with towel roll Qped- hip ext with knee flexed; hip ER/IR at end range hip ext with knee flexed IASTM lateral hip, ITB, quads, hip  flexors Iliacus release in supine Supine hip ER- single leg butterfly stretch   4/18   Nu-step: 5 min L5   Manual: Side lying anterior hip stretching; Inferior glide Grade 1 to start progressing to grade II. Roller to anterior hip and gluteal  Double knee to chest with ball 2x10 Ball press and hold 2x15  Ball perturbations x30 sec 2x    2 inch step and hold 2x10  2 inch lateral step 2x10     PATIENT EDUCATION:  Education details: Teacher, music of condition, POC, HEP, exercise form/rationale Person educated: Patient and Parent Education method: Explanation, Demonstration, Tactile cues, Verbal cues, and Handouts Education comprehension: verbalized understanding, returned demonstration, verbal cues required, tactile cues required, and needs further education  HOME EXERCISE PROGRAM:  Access Code: [email or web address] URL: [email or web address]   ASSESSMENT:  CLINICAL IMPRESSION: Will place pt on hold until obtaining a second opinion regarding hip pain. Pt has reached out to therapist and requested visits twice per week. Will reach out to me with any further questions in the mean time.    OBJECTIVE IMPAIRMENTS: Abnormal gait, decreased activity tolerance, decreased balance, difficulty walking, decreased ROM, decreased strength, increased muscle spasms, impaired flexibility, improper body mechanics, and pain.   ACTIVITY LIMITATIONS: carrying, lifting, bending, sitting, standing, squatting, stairs,  transfers, bed mobility, bathing, dressing, and locomotion level  PARTICIPATION LIMITATIONS: meal prep, cleaning, medication management, driving, shopping, community activity, and occupation  PERSONAL FACTORS: 3+ comorbidities: previous labral repair, EDS, autonomic dysfunction  are also affecting patient's functional outcome.     GOALS: Goals reviewed with patient? Yes  SHORT TERM GOALS: Target date: 05/26/22 30s STS without pain Baseline: x12 Goal status: achieved  2.  Able to demo household ambulation without AD and with proper pattern Baseline:  Goal status: achieved    LONG TERM GOALS: Target date: POC date  Able to demo step up on 6" step with level pelvis and proper form Baseline:  Goal status: 06/20/22  2.  Will tolerate at least 3 min on elliptical, demonstrating good tolerance to repetitive weight bearing motion Baseline:  Goal status: achieved Week 8 06/20/22  3.  Demonstrate proper form in at least 10 continuous lunges without increased pain Baseline:  Goal status: ongoing  4.  Demonstrate gentle, double and single foot plyometric motions with good proximal form Baseline:  Goal status: ongoing  5.  Pt will begin gradual return to group fitness with understanding of modifications and precautions Baseline:  Goal status: ongoing    PLAN:  PT FREQUENCY: 1-2x/week  PT DURATION: 12 weeks  PLANNED INTERVENTIONS: Therapeutic exercises, Therapeutic activity, Neuromuscular re-education, Balance training, Gait training, Patient/Family education, Self Care, Joint mobilization, Stair training, Aquatic Therapy, Dry Needling, Electrical stimulation, Spinal mobilization, Cryotherapy, Moist heat, Taping, Traction, Ultrasound, Ionotophoresis 4mg /ml Dexamethasone, and Manual therapy  PLAN FOR NEXT SESSION: STM to hip musculature PROM with mobs as tolerated. Continue to progress core stability and hip strengthening.   Dalicia Kisner C. Virgil Slinger PT, DPT 09/25/22 4:56 PM

## 2022-09-26 NOTE — Telephone Encounter (Signed)
Records sent

## 2022-09-26 NOTE — Telephone Encounter (Signed)
Noted thanks °

## 2022-09-29 ENCOUNTER — Encounter (HOSPITAL_BASED_OUTPATIENT_CLINIC_OR_DEPARTMENT_OTHER): Payer: BC Managed Care – PPO | Admitting: Physical Therapy

## 2022-09-29 ENCOUNTER — Encounter: Payer: Self-pay | Admitting: Physical Medicine and Rehabilitation

## 2022-09-29 ENCOUNTER — Encounter (HOSPITAL_BASED_OUTPATIENT_CLINIC_OR_DEPARTMENT_OTHER): Payer: Self-pay

## 2022-09-29 ENCOUNTER — Encounter
Payer: BC Managed Care – PPO | Attending: Physical Medicine and Rehabilitation | Admitting: Physical Medicine and Rehabilitation

## 2022-09-29 VITALS — BP 112/77 | HR 89 | Ht 64.0 in | Wt 113.0 lb

## 2022-09-29 DIAGNOSIS — S73191S Other sprain of right hip, sequela: Secondary | ICD-10-CM | POA: Diagnosis present

## 2022-09-29 DIAGNOSIS — G8929 Other chronic pain: Secondary | ICD-10-CM

## 2022-09-29 DIAGNOSIS — R109 Unspecified abdominal pain: Secondary | ICD-10-CM | POA: Insufficient documentation

## 2022-09-29 DIAGNOSIS — G43709 Chronic migraine without aura, not intractable, without status migrainosus: Secondary | ICD-10-CM | POA: Diagnosis not present

## 2022-09-29 MED ORDER — ONABOTULINUMTOXINA 100 UNITS IJ SOLR
200.0000 [IU] | Freq: Once | INTRAMUSCULAR | Status: AC
Start: 2022-09-29 — End: 2022-09-29
  Administered 2022-09-29: 200 [IU] via INTRAMUSCULAR

## 2022-09-29 MED ORDER — SODIUM CHLORIDE (PF) 0.9 % IJ SOLN
4.0000 mL | Freq: Once | INTRAMUSCULAR | Status: AC
Start: 2022-09-29 — End: 2022-09-29
  Administered 2022-09-29: 4 mL

## 2022-09-29 NOTE — Progress Notes (Unsigned)
Subjective:    [name], female    DOB: November 25, 1995, 27 y.o.   MRN: [id number]  HPI   Brianna [name] is a 27 year old woman who presents for f/u of her migraines, hypermobility- related pains, insomnia, palpitations, and dizziness.   1) Chronic nerve pain:  -Pain on average is 6/10 -Pain right now is 3/10 -She uses a heating pad. Alternates ice for short times -She has occasional flare-ups concentrated around her mid-section.  -She is currently on Cymbalta which can help with nerve pain.  -she was started on Gabapentin and felt double vision and tingling on lips.   2) Pelvic floor dysfunction: -She is starting with a new therapist soon.  -Had a pelvic MRI at [facility] and reviewed this with her and was negative.  -she asks if this is contributing to her hip pain.   3) GI symptoms -Struggled with these since she was 27 years old.   4) History of multiple suicidal ideations  5) Spasms: -present throughout her body -her chriopractor may start her on a muscle relaxer.   6) Depression:  -suffered night sweats from Cymbalta.   7) Insomnia:  -sleep has been very poor for a year.  -she feels that pain is contributing -her job ends at 10:30.  -she did not find Nortryptyline 40mg  to be very helpful -she has tried Amitriptyline in the past but cannot remember her side effect to it- thinks it may have been grogginess.   8) Anxiety: -she takes clonazepam as needed.  -has been working long hours at night without breaks -received a note saying she can only do 4 days per week.  9) ADHD: has not found much improvement in focus with Ritalin.   10) Migraines-  -has been following with neurology.Has not found great relief with the Rizatriptan. The Nortriptyline seems to have been making her thoughts more negative.  -is concerned about the side effects of triptans -had benefits from prior Botox but migraines have started to come back now  11) Right hip pain  getting worse despite aquatic therapy -she was found to have a labral tear on MRI and has a surgical consultation on Friday.  -had arthroscopy last year -did PT and does not feel she did her best with home exercises because life has been tough -pain has been really bad in the last few weeks, last week was terrible -right now pain is 2-3 -last week it started to feel like how it felt prior to surgery -she had a steroid injection with lidocaine last week and pain felt terribly worse and she had to take it really easy the next day -he ordered an MRI which is on the 11th.   12) Palpitations -she has had since she was a child  13) Dizziness: -she often has symptoms when she stands from a seated position -she tried midodrine but experienced side effects -she does have a blood pressure cuff to check her pressures at home -she usually checks her BP when she is in a seated position.   14) Migraines -takes Klonopin -she has tried Amitriptyline, Vistaril, Topamax, Fish Oil, Amerge -she has never tried Botox before -she has tried Mer[employer] for 6 months  -she has a headache every singe day.       Pain Inventory Average Pain 5 Pain Right Now 6 My pain is constant, sharp, dull, and tingling  In the last 24 hours, has pain interfered with the following? General activity 8 Relation with others 8 Enjoyment  of life 10 What TIME of day is your pain at its worst? morning  and evening Sleep (in general) Poor  Pain is worse with: walking, inactivity, and some activites Pain improves with: heat/ice and pacing activities Relief from Meds: 0      Family History  Problem Relation Age of Onset   Diabetes Mother    Polycystic ovary syndrome Mother    GER disease Father    Heart disease Paternal Uncle    Social History   Socioeconomic History   Marital status: Single    Spouse name: Not on file   Number of children: Not on file   Years of education: Not on file   Highest education  level: Not on file  Occupational History   Not on file  Tobacco Use   Smoking status: Former    Types: E-cigarettes, Cigarettes    Quit date: 2021    Years since quitting: 3.3   Smokeless tobacco: Never  Vaping Use   Vaping Use: Former   Substances: Financial trader  Substance and Sexual Activity   Alcohol use: Not Currently   Drug use: Yes    Frequency: 7.0 times per week    Types: Marijuana   Sexual activity: Yes    Birth control/protection: None  Other Topics Concern   Not on file  Social History Narrative   Right handed   Social Determinants of Health   Financial Resource Strain: Not on file  Food Insecurity: Not on file  Transportation Needs: Not on file  Physical Activity: Not on file  Stress: Not on file  Social Connections: Not on file   Past Surgical History:  Procedure Laterality Date   CHOLECYSTECTOMY  05/09/2021   ESOPHAGOGASTRODUODENOSCOPY ENDOSCOPY     03/2020   HIP ARTHROSCOPY Right 04/25/2022   Procedure: RIGHT HIP ARTHROSCOPY WITH LABRAL RECONSTRUCTION, ILIOTIBIAL BAND ALLOGRAFT, PSOAS RELEASE;  Surgeon: [name], MD;  Location: [facility] OR;  Service: Orthopedics;  Laterality: Right;   LABRAL REPAIR Right 03/02/2021   Procedure: RIGHT HIP ARTHROSCOPY WITH LABRAL REPAIR AND PINCE DEBRIDEMENT;  Surgeon: [name], MD;  Location: [facility] OR;  Service: Orthopedics;  Laterality: Right;   SMART PILL PROCEDURE     12/2020   TYMPANOSTOMY TUBE PLACEMENT Bilateral    WISDOM TOOTH EXTRACTION     Past Medical History:  Diagnosis Date   Acute recurrent pansinusitis 10/21/2019   ADHD (attention deficit hyperactivity disorder)    inattentive   Anemia    had iron infusions in Fall of 2023   Anxiety    Asthma    Bronchitis    Cat allergy due to both airborne and skin contact 05/01/2016   Chronic sore throat 05/01/2016   COVID 2020   Depression    Fibromyalgia    pt denies   Gastroparesis    GERD (gastroesophageal reflux disease)    History of  hypotension    taken midodrine in the past- Spring of 2022   Hypermobile Ehlers-Danlos syndrome    Irregular heart rate    Keratosis pilaris    Migraine    PONV (postoperative nausea and vomiting)    "I can get really cold"   PTSD (post-traumatic stress disorder)    Seasonal allergic rhinitis 03/11/2014   BP 112/77   Pulse 89   Ht 5\' 4"  (1.626 m)   Wt 113 lb (51.3 kg)   SpO2 99%   BMI 19.40 kg/m   Opioid Risk Score:   Fall Risk Score:  `1  Depression  screen [facility] 2/9     09/29/2022   10:33 AM 03/13/2022    2:50 PM 01/12/2022   10:07 AM 02/14/2021   12:13 PM 01/11/2021    9:23 AM 11/02/2020    3:33 PM 10/11/2020   12:31 PM  Depression screen PHQ 2/9  Decreased Interest 0 3 3 1 1 2 1   Down, Depressed, Hopeless 0 3 3 2 1 2 1   PHQ - 2 Score 0 6 6 3 2 4 2   Altered sleeping    3  3   Tired, decreased energy    3  3   Change in appetite    3  3   Feeling bad or failure about yourself     2  3   Trouble concentrating    3  3   Moving slowly or fidgety/restless    3  3   Suicidal thoughts   3 1  1    PHQ-9 Score    21  23   Difficult doing work/chores    Very difficult  Extremely dIfficult     Review of Systems  Constitutional:  Negative for diaphoresis and unexpected weight change.  HENT: Negative.    Eyes: Negative.   Respiratory:  Negative for shortness of breath.   Cardiovascular:  Negative for leg swelling.  Gastrointestinal:  Negative for abdominal pain, constipation, diarrhea and nausea.  Endocrine: Negative.   Genitourinary:  Positive for pelvic pain.       Bladder control  Musculoskeletal:  Positive for back pain, gait problem and myalgias.       Pain on right side of head , pain in right and left hand ,   Skin: Negative.   Allergic/Immunologic: Negative.   Neurological:  Negative for dizziness and weakness.       Tingling  Hematological:  Does not bruise/bleed easily.  Psychiatric/Behavioral:  Positive for sleep disturbance and suicidal ideas. Negative for  confusion and dysphoric mood. The patient is not nervous/anxious.   All other systems reviewed and are negative.      Objective:   Physical Exam Not performed as patient was seen via phone.     Assessment & Plan:  1) Chronic Pain Syndrome secondary to fibromyalgia -Discussed current symptoms of pain and history of pain.  -Discussed benefits of exercise in reducing pain. -Apply emu oil. -continue Lamotrigine -discussed benefits of minimizing medications.  -Lupus panel ordered.  -RA and ANA reviewed and negative.  -Discussed trigger point injections -Discussed current symptoms of pain and history of pain.  -Discussed benefits of exercise in reducing pain. -Discussed following foods that may reduce pain: 1) Ginger (especially studied for arthritis)- reduce leukotriene production to decrease inflammation 2) Blueberries- high in phytonutrients that decrease inflammation 3) Salmon- marine omega-3s reduce joint swelling and pain 4) Pumpkin seeds- reduce inflammation 5) dark chocolate- reduces inflammation 6) turmeric- reduces inflammation 7) tart cherries - reduce pain and stiffness 8) extra virgin olive oil - its compound olecanthal helps to block prostaglandins  9) chili peppers- can be eaten or applied topically via capsaicin 10) mint- helpful for headache, muscle aches, joint pain, and itching 11) garlic- reduces inflammation  Link to further information on diet for chronic pain: [email or web address]   2) Pelvic floor dysfunction: -continue pelvic floor therapy.  -estrogen, testosterone,  level -provided a link to a pdf of Pete Escogue's musculoskeletal alignment exercises: [email or web address]   3) Depression: -she has been going to therapy twice per week. -had negative response to Cymbalta (night sweats).  -  discussed Remeron and Amitriptyline as  medication options- she cannot remember why she stopped these in the pasts. Will defer until we see her blood pressure/HR trends.   4) Functional dyspepsia: -refer to gastroenterology for gastric emptying study.  -no benefits from Buspar, stop Buspar.  -discussed diet  5) contraception: -started a NuvaRing a couple of days ago.   6) pain in lower back and legs: -stop Gabapentin  7) insomnia: -did not have results with Amitriptyline 10mg  or Nortriptyline 50mg - have discontinued both -Try to go outside near sunrise -Get exercise during the day.  -Turn off all devices an hour before bedtime.  -Teas that can benefit: chamomile, valerian root, Brahmi (Bacopa) -Can consider over the counter melatonin, magnesium, and/or L-theanine. Melatonin is an anti-oxidant with multiple health benefits. Magnesium is involved in greater than 300 enzymatic reactions in the body and most of [place] are deficient as our soil is often depleted. There are 7 different types of magnesium- Bioptemizer's is a supplement with all 7 types, and each has unique benefits. Magnesium can also help with constipation and anxiety.  -Pistachios naturally increase the production of melatonin -Cozy Earth bamboo bed sheets are free from toxic chemicals.  -Tart cherry juice or a tart cherry supplement can improve sleep and soreness post-workout      9) Hypotension: discontinued the midodrine. Recommended she check her BP daily while lying down, sitting up, and standing so we can observe for orthostatic hypotension.  10) Palpitations: discussed that could be secondary to dysautonomia which can respond to propanolol or metoprolol. Will check BP/HR first before trying these medications which can lower BP  11) Migraine -discussed failure of Emgality, Amitriptyline, Topamax, Klonopin, Vistaril, Fish oil -discussed positive response to Botox -discussed plan to use all 200U next visit -discussed nurtec and ubrelvy and their mechanisms of  action  Foods to alleviate migraine:  1) dark leafy greens 2) avocado 3) tuna 4) samon and mackerel 5) beans and legumes Supplements that can be helpful: feverfew, B12, and magnesium  Foods to avoid in migraine: 1) Excessive (or irregular timing) coffee 2) red wine 3) aged cheeses 4) chocolate 5) citrus fruits 6) aspartame and other artifical sweeteners 7) yeast 8) MSG (in processed foods) 9) processed and cured meats 10) nuts and certain seeds 11) chicken livers and other organ meats 12) dairy products like buttermilk, sour cream, and yogurt 13) dried fruits like dates, figs, and raisins 14) garlic 15) onions 16) potato chips 17) pickled foods like olives and sauerkraut 18) some fresh fruits like ripe banana, papaya, red plums, raspberries, kiwi, pineapple 19) tomato-based products  Recommend to keep a migraine diary: rate daily the severity of your headache (1-10) and what foods you eat that day to help determine patterns.    5 minutes spent in discussion of her migraines, positive response to botox, discussed using all 200U next visit, discussed Nurtec and Ubrelvy and their mechanisms of action

## 2022-10-03 ENCOUNTER — Encounter: Payer: BC Managed Care – PPO | Admitting: Physical Medicine and Rehabilitation

## 2022-10-04 ENCOUNTER — Encounter: Payer: Self-pay | Admitting: Physical Medicine and Rehabilitation

## 2022-10-04 ENCOUNTER — Telehealth: Payer: Self-pay | Admitting: *Deleted

## 2022-10-04 DIAGNOSIS — M7918 Myalgia, other site: Secondary | ICD-10-CM

## 2022-10-04 DIAGNOSIS — G8929 Other chronic pain: Secondary | ICD-10-CM

## 2022-10-04 NOTE — Telephone Encounter (Signed)
Patient inquiring about referral placed to see Dr. Kieth Brightly.  Please r/t call @ [phone].

## 2022-10-09 ENCOUNTER — Other Ambulatory Visit: Payer: Self-pay | Admitting: Orthopedic Surgery

## 2022-10-09 DIAGNOSIS — M25551 Pain in right hip: Secondary | ICD-10-CM

## 2022-10-11 ENCOUNTER — Telehealth: Payer: Self-pay | Admitting: Gastroenterology

## 2022-10-11 NOTE — Telephone Encounter (Signed)
Request received to transfer GI care from outside practice to [facility].  We appreciate the interest in our practice, however at this time due to high demand from patients without established GI providers we cannot accommodate this transfer. Unfortunately I don't have availability to accommodate this request.

## 2022-10-11 NOTE — Telephone Encounter (Signed)
Good afternoon,   We received a referral for this patient and she is calling requesting a transfer of care to you for gastroparesis. Patient has history with [facility] and [facility] and records are available through Care Everywhere. Patient has requested a transfer of care to you twice and has been recommended to follow up in 6 - 12 months for possible further reconsideration. Patient is now calling and would like records reviewed again. Would you please advise on scheduling?   Thank you.

## 2022-10-12 NOTE — Addendum Note (Signed)
Addended by: Horton Chin on: 10/12/2022 12:32 PM   Modules accepted: Orders

## 2022-10-17 ENCOUNTER — Ambulatory Visit
Admission: RE | Admit: 2022-10-17 | Discharge: 2022-10-17 | Disposition: A | Discharge status: Home / Self Care | Payer: BC Managed Care – PPO | Source: Ambulatory Visit | Attending: Orthopedic Surgery | Admitting: Orthopedic Surgery

## 2022-10-17 DIAGNOSIS — M25551 Pain in right hip: Secondary | ICD-10-CM

## 2022-10-17 MED ORDER — METHYLPREDNISOLONE ACETATE 40 MG/ML INJ SUSP (RADIOLOG
40.0000 mg | Freq: Once | INTRAMUSCULAR | Status: AC
  Administered 2022-10-17: 40 mg via INTRAMUSCULAR

## 2022-10-17 MED ORDER — IOPAMIDOL (ISOVUE-M 200) INJECTION 41%
1.0000 mL | Freq: Once | INTRAMUSCULAR | Status: AC
  Administered 2022-10-17: 1 mL

## 2022-10-18 ENCOUNTER — Encounter (HOSPITAL_BASED_OUTPATIENT_CLINIC_OR_DEPARTMENT_OTHER): Payer: Self-pay | Admitting: Orthopaedic Surgery

## 2022-10-19 ENCOUNTER — Encounter: Payer: Self-pay | Admitting: Obstetrics and Gynecology

## 2022-10-21 ENCOUNTER — Encounter (HOSPITAL_COMMUNITY): Payer: Self-pay | Admitting: Obstetrics & Gynecology

## 2022-10-21 ENCOUNTER — Inpatient Hospital Stay (HOSPITAL_COMMUNITY)
Admission: AD | Admit: 2022-10-21 | Discharge: 2022-10-21 | Disposition: A | Discharge status: Home / Self Care | Payer: BC Managed Care – PPO | Attending: Obstetrics & Gynecology | Admitting: Obstetrics & Gynecology

## 2022-10-21 DIAGNOSIS — N939 Abnormal uterine and vaginal bleeding, unspecified: Secondary | ICD-10-CM | POA: Diagnosis not present

## 2022-10-21 DIAGNOSIS — M549 Dorsalgia, unspecified: Secondary | ICD-10-CM | POA: Diagnosis present

## 2022-10-21 DIAGNOSIS — K3184 Gastroparesis: Secondary | ICD-10-CM | POA: Insufficient documentation

## 2022-10-21 LAB — CBC
HCT: 40.1 % (ref 36.0–46.0)
Hemoglobin: 13.4 g/dL (ref 12.0–15.0)
MCH: 31.3 pg (ref 26.0–34.0)
MCHC: 33.4 g/dL (ref 30.0–36.0)
MCV: 93.7 fL (ref 80.0–100.0)
Platelets: 215 10*3/uL (ref 150–400)
RBC: 4.28 MIL/uL (ref 3.87–5.11)
RDW: 12.1 % (ref 11.5–15.5)
WBC: 5.1 10*3/uL (ref 4.0–10.5)
nRBC: 0 % (ref 0.0–0.2)

## 2022-10-21 LAB — POCT PREGNANCY, URINE: Preg Test, Ur: NEGATIVE

## 2022-10-21 MED ORDER — MEDROXYPROGESTERONE ACETATE 10 MG PO TABS: 10.0000 mg | ORAL_TABLET | Freq: Every day | ORAL | 0 refills | Status: DC

## 2022-10-21 NOTE — MAU Note (Addendum)
Brianna [name] is a 27 y.o. at Unknown here in MAU reporting: started bleeding 15days ago.  Onset of this bleeding was day 35, usually has 25-28day cycle.  Neg preg test a wk ago, unprotected sex 5/29.  Lower abd/pelvic pain and lower back pain, sharp and intense.  Pain with penetration.  After day 3  the bleeding went from bright red to brownish,  but the pain got worse.   When asked if pain comes and goes, states is "chronic", asked again if comes and goes, repeated chronic.  Having migraine HA LMP: 5/18 Onset of complaint: 15days ago Pain score: 7  "CHRONIC", has gotten worse Vitals:   10/21/22 0942  BP: 116/82  Pulse: 67  Resp: 16  Temp: 97.9 F (36.6 C)  SpO2: 100%      Lab orders placed from triage:  UPT  is Neg

## 2022-10-21 NOTE — MAU Provider Note (Signed)
History     [id number]  Arrival date and time: 10/21/22 4098    Chief Complaint  Patient presents with   Abdominal Pain   Back Pain   Vaginal Bleeding    HPI Brianna [name] is a 27 y.o. non pregnancy women, here with concerns about vaginal bleeding that has been on for about 15 days. She states that her periods are usually regular and she hasn't experienced abnormal bleeding in the past. She has had negative pregnancy tests at home a week ago.  She has an appt set up for 6/23 with the [place] office, but had been communicating with the office via mychart yesterday because she was worried about the bleeding. Dr Shawnie Pons had recommended getting an [place], CBC and starting her on provera for 10 days, pending her appointment. She is concerned about anemia. Also has some abdominal cramping and back pain with the bleeding. Had unprotected intercourse 72hrs ago and is worried about getting pregnant. Considering plan B.  OB History     Gravida  1   Para  0   Term      Preterm  0   AB  1   Living  0      SAB      IAB  1   Ectopic  0   Multiple      Live Births  0           Past Medical History:  Diagnosis Date   Acute recurrent pansinusitis 10/21/2019   ADHD (attention deficit hyperactivity disorder)    inattentive   Anemia    had iron infusions in Fall of 2023   Anxiety    Asthma    Bronchitis    Cat allergy due to both airborne and skin contact 05/01/2016   Chronic sore throat 05/01/2016   COVID 2020   Depression    Fibromyalgia    pt denies   Gastroparesis    GERD (gastroesophageal reflux disease)    History of hypotension    taken midodrine in the past- Spring of 2022   Hypermobile Ehlers-Danlos syndrome    Irregular heart rate    Keratosis pilaris    Migraine    PONV (postoperative nausea and vomiting)    "I can get really cold"   PTSD (post-traumatic stress disorder)    Seasonal allergic rhinitis 03/11/2014    Past Surgical History:   Procedure Laterality Date   CHOLECYSTECTOMY  05/09/2021   ESOPHAGOGASTRODUODENOSCOPY ENDOSCOPY     03/2020   HIP ARTHROSCOPY Right 04/25/2022   Procedure: RIGHT HIP ARTHROSCOPY WITH LABRAL RECONSTRUCTION, ILIOTIBIAL BAND ALLOGRAFT, PSOAS RELEASE;  Surgeon: [name], MD;  Location: [facility] OR;  Service: Orthopedics;  Laterality: Right;   LABRAL REPAIR Right 03/02/2021   Procedure: RIGHT HIP ARTHROSCOPY WITH LABRAL REPAIR AND PINCE DEBRIDEMENT;  Surgeon: [name], MD;  Location: [facility] OR;  Service: Orthopedics;  Laterality: Right;   SMART PILL PROCEDURE     12/2020   TYMPANOSTOMY TUBE PLACEMENT Bilateral    WISDOM TOOTH EXTRACTION      Family History  Problem Relation Age of Onset   Diabetes Mother    Polycystic ovary syndrome Mother    GER disease Father    Heart disease Paternal Uncle     Social History   Socioeconomic History   Marital status: Single    Spouse name: Not on file   Number of children: Not on file   Years of education: Not on file   Highest education level: Not  on file  Occupational History   Not on file  Tobacco Use   Smoking status: Former    Types: E-cigarettes, Cigarettes    Quit date: 2021    Years since quitting: 3.4   Smokeless tobacco: Never  Vaping Use   Vaping Use: Former   Substances: Financial trader  Substance and Sexual Activity   Alcohol use: Not Currently   Drug use: Yes    Frequency: 7.0 times per week    Types: Marijuana   Sexual activity: Yes    Birth control/protection: None  Other Topics Concern   Not on file  Social History Narrative   Right handed   Social Determinants of Health   Financial Resource Strain: Not on file  Food Insecurity: Not on file  Transportation Needs: Not on file  Physical Activity: Not on file  Stress: Not on file  Social Connections: Not on file  Intimate Partner Violence: Not on file    Allergies  Allergen Reactions   Clindamycin Hives, Rash and Swelling    Other reaction(s):  swelling   Tretinoin Dermatitis, Itching, Photosensitivity, Swelling and Other (See Comments)    No current facility-administered medications on file prior to encounter.   Current Outpatient Medications on File Prior to Encounter  Medication Sig Dispense Refill   Omega-3 Fatty Acids (FISH OIL PO) Take 1,400 Units by mouth daily. Mini     ondansetron (ZOFRAN ODT) 4 MG disintegrating tablet Take 1 tablet (4 mg total) by mouth every 8 (eight) hours as needed for nausea or vomiting. 20 tablet 5   Pediatric Multiple Vitamins (PEDIATRIC MULTIVITAMIN) chewable tablet Chew 1 tablet by mouth daily.     promethazine (PHENERGAN) 25 MG tablet Take 1 tablet (25 mg total) by mouth every 6 (six) hours as needed for nausea or vomiting. (Patient taking differently: Take 12.5 mg by mouth every 6 (six) hours as needed for nausea or vomiting.) 30 tablet 0   tazarotene (TAZORAC) 0.05 % cream Apply 1 Application topically every other day.     triamcinolone cream (KENALOG) 0.1 % Apply 1 Application topically daily as needed (Eczma).       Review of Systems  Constitutional:  Negative for chills and fever.  Eyes:  Negative for blurred vision.  Cardiovascular:  Negative for leg swelling.  Gastrointestinal:  Positive for abdominal pain. Negative for vomiting.  Genitourinary:  Negative for dysuria, frequency and urgency.  Musculoskeletal:  Negative for myalgias.  Skin:  Negative for itching.  Neurological:  Negative for dizziness.   Pertinent positives and negative per HPI, all others reviewed and negative  Physical Exam   BP 116/82 (BP Location: Right Arm)   Pulse 67   Temp 97.9 F (36.6 C) (Oral)   Resp 16   Ht 5\' 4"  (1.626 m)   Wt 48.7 kg   LMP 10/07/2022 (Exact Date)   SpO2 100%   BMI 18.44 kg/m   Patient Vitals for the past 24 hrs:  BP Temp Temp src Pulse Resp SpO2 Height Weight  10/21/22 0942 116/82 97.9 F (36.6 C) Oral 67 16 100 % 5\' 4"  (1.626 m) 48.7 kg    Physical Exam Vitals  reviewed.  Constitutional:      General: She is not in acute distress.    Appearance: She is well-developed. She is not toxic-appearing.  HENT:     Head: Normocephalic and atraumatic.     Mouth/Throat:     Mouth: Mucous membranes are moist.  Eyes:     Extraocular Movements:  Extraocular movements intact.  Cardiovascular:     Rate and Rhythm: Normal rate.  Pulmonary:     Effort: Pulmonary effort is normal. No respiratory distress.  Abdominal:     Palpations: Abdomen is soft.  Skin:    General: Skin is warm and dry.  Neurological:     Mental Status: She is alert and oriented to person, place, and time.  Psychiatric:        Mood and Affect: Mood is anxious.        Behavior: Behavior normal.     Labs Results for orders placed or performed during the hospital encounter of 10/21/22 (from the past 24 hour(s))  Pregnancy, urine POC     Status: None   Collection Time: 10/21/22  9:37 AM  Result Value Ref Range   Preg Test, Ur NEGATIVE NEGATIVE  CBC     Status: None   Collection Time: 10/21/22 10:26 AM  Result Value Ref Range   WBC 5.1 4.0 - 10.5 K/uL   RBC 4.28 3.87 - 5.11 MIL/uL   Hemoglobin 13.4 12.0 - 15.0 g/dL   HCT 16.1 09.6 - 04.5 %   MCV 93.7 80.0 - 100.0 fL   MCH 31.3 26.0 - 34.0 pg   MCHC 33.4 30.0 - 36.0 g/dL   RDW 40.9 81.1 - 91.4 %   Platelets 215 150 - 400 K/uL   nRBC 0.0 0.0 - 0.2 %    Imaging No results found.  MAU Course  Procedures Lab Orders         CBC         Pregnancy, urine POC    Meds ordered this encounter  Medications   medroxyPROGESTERone (PROVERA) 10 MG tablet    Sig: Take 1 tablet (10 mg total) by mouth daily for 10 days.    Dispense:  10 tablet    Refill:  0   Imaging Orders         US PELVIC COMPLETE WITH TRANSVAGINAL     MDM Moderate (Level 3-4)  Assessment and Plan  #Abnormal uterine bleeding - Plan: US PELVIC COMPLETE WITH TRANSVAGINAL  Has been ongoing, has outpatient follow up which is appropriate. Hgb is normal.  I let  her know that we do not get ultrasounds here to evaluate for non emergent GYN concerns.  I have sent in a prescription for provera 10mg  daily for 10 days, as recommended by Dr Shawnie Pons in her nychart message.   OK to take plan B today if desired, dicussed the risk of worse irregular bleeding. Pelvic US ordered, to be scheduled and performed outpatient and can be reviewed at her Gyn appointment. She will follow up in the office for any further concerns  Dispo: discharged to home in stable condition   Allergies as of 10/21/2022       Reactions   Clindamycin Hives, Rash, Swelling   Other reaction(s): swelling   Tretinoin Dermatitis, Itching, Photosensitivity, Swelling, Other (See Comments)        Medication List     STOP taking these medications    buPROPion 75 MG tablet Commonly known as: WELLBUTRIN       TAKE these medications    FISH OIL PO Take 1,400 Units by mouth daily. Mini   medroxyPROGESTERone 10 MG tablet Commonly known as: Provera Take 1 tablet (10 mg total) by mouth daily for 10 days.   ondansetron 4 MG disintegrating tablet Commonly known as: Zofran ODT Take 1 tablet (4 mg total) by mouth every  8 (eight) hours as needed for nausea or vomiting.   pediatric multivitamin chewable tablet Chew 1 tablet by mouth daily.   promethazine 25 MG tablet Commonly known as: PHENERGAN Take 1 tablet (25 mg total) by mouth every 6 (six) hours as needed for nausea or vomiting. What changed: how much to take   tazarotene 0.05 % cream Commonly known as: TAZORAC Apply 1 Application topically every other day.   triamcinolone cream 0.1 % Commonly known as: KENALOG Apply 1 Application topically daily as needed (Eczma).       Sheppard Evens MD MPH OB Fellow, Faculty Practice [facility] 10/21/2022

## 2022-10-21 NOTE — MAU Note (Signed)
Informed pt that our preg test was negative.  That I had spoke with the dr and we have ordered a CBC to check her blood counts.   They should be here shortly to draw that. The dr will be in to see her soon.   Pt states I would also like to have either a CT or an [place].  I informed her that the dr would discuss this.

## 2022-10-23 ENCOUNTER — Other Ambulatory Visit: Payer: Self-pay

## 2022-10-24 ENCOUNTER — Ambulatory Visit (INDEPENDENT_AMBULATORY_CARE_PROVIDER_SITE_OTHER): Payer: BC Managed Care – PPO

## 2022-10-24 ENCOUNTER — Other Ambulatory Visit: Payer: Self-pay

## 2022-10-24 ENCOUNTER — Encounter (HOSPITAL_BASED_OUTPATIENT_CLINIC_OR_DEPARTMENT_OTHER): Payer: Self-pay | Admitting: Physical Therapy

## 2022-10-24 ENCOUNTER — Ambulatory Visit (HOSPITAL_BASED_OUTPATIENT_CLINIC_OR_DEPARTMENT_OTHER): Payer: BC Managed Care – PPO | Attending: Orthopaedic Surgery | Admitting: Physical Therapy

## 2022-10-24 DIAGNOSIS — M6281 Muscle weakness (generalized): Secondary | ICD-10-CM | POA: Diagnosis present

## 2022-10-24 DIAGNOSIS — M542 Cervicalgia: Secondary | ICD-10-CM | POA: Insufficient documentation

## 2022-10-24 DIAGNOSIS — N939 Abnormal uterine and vaginal bleeding, unspecified: Secondary | ICD-10-CM

## 2022-10-24 DIAGNOSIS — M25551 Pain in right hip: Secondary | ICD-10-CM | POA: Diagnosis present

## 2022-10-24 DIAGNOSIS — M7918 Myalgia, other site: Secondary | ICD-10-CM | POA: Insufficient documentation

## 2022-10-24 DIAGNOSIS — R262 Difficulty in walking, not elsewhere classified: Secondary | ICD-10-CM | POA: Insufficient documentation

## 2022-10-24 NOTE — Telephone Encounter (Signed)
Patient seeing another practice currently.

## 2022-10-24 NOTE — Therapy (Signed)
OUTPATIENT PHYSICAL THERAPY EVALUATION   Patient Name: Brianna [name] MRN: [id number] DOB:1995/10/22, 27 y.o., female Today's Date: 10/24/2022  END OF SESSION:  PT End of Session - 10/24/22 1241     Visit Number 1    Number of Visits 7    Date for PT Re-Evaluation 12/09/22    Authorization Type BCBS    PT Start Time 1239    PT Stop Time 1317    PT Time Calculation (min) 38 min    Activity Tolerance Patient tolerated treatment well    Behavior During Therapy Flat affect             Past Medical History:  Diagnosis Date   Acute recurrent pansinusitis 10/21/2019   ADHD (attention deficit hyperactivity disorder)    inattentive   Anemia    had iron infusions in Fall of 2023   Anxiety    Asthma    Bronchitis    Cat allergy due to both airborne and skin contact 05/01/2016   Chronic sore throat 05/01/2016   COVID 2020   Depression    Fibromyalgia    pt denies   Gastroparesis    GERD (gastroesophageal reflux disease)    History of hypotension    taken midodrine in the past- Spring of 2022   Hypermobile Ehlers-Danlos syndrome    Irregular heart rate    Keratosis pilaris    Migraine    PONV (postoperative nausea and vomiting)    "I can get really cold"   PTSD (post-traumatic stress disorder)    Seasonal allergic rhinitis 03/11/2014   Past Surgical History:  Procedure Laterality Date   CHOLECYSTECTOMY  05/09/2021   ESOPHAGOGASTRODUODENOSCOPY ENDOSCOPY     03/2020   HIP ARTHROSCOPY Right 04/25/2022   Procedure: RIGHT HIP ARTHROSCOPY WITH LABRAL RECONSTRUCTION, ILIOTIBIAL BAND ALLOGRAFT, PSOAS RELEASE;  Surgeon: [name], MD;  Location: [facility] OR;  Service: Orthopedics;  Laterality: Right;   LABRAL REPAIR Right 03/02/2021   Procedure: RIGHT HIP ARTHROSCOPY WITH LABRAL REPAIR AND PINCE DEBRIDEMENT;  Surgeon: [name], MD;  Location: [facility] OR;  Service: Orthopedics;  Laterality: Right;   SMART PILL PROCEDURE     12/2020   TYMPANOSTOMY TUBE PLACEMENT  Bilateral    WISDOM TOOTH EXTRACTION     Patient Active Problem List   Diagnosis Date Noted   Cervical adenopathy 06/10/2021   Chronic pruritus 06/10/2021   Exercise-induced asthma 06/09/2021   Chronic venous insufficiency 06/03/2021   Orthostatic lightheadedness 06/03/2021   Purple toe syndrome of both feet (HCC) 06/03/2021   Autonomic dysfunction 06/03/2021   Tear of right acetabular labrum 04/13/2021   Migraine headache 03/01/2021   Headache 11/30/2020   Positive ANA (antinuclear antibody) 11/15/2020   Fibromyalgia 11/02/2020   Hair loss 11/02/2020   Muscle spasticity 10/22/2020   Hypermobile Ehlers-Danlos syndrome 10/22/2020   Paresthesia of bilateral legs 10/06/2020   Myalgia 10/06/2020   Chronic pelvic pain in female 10/06/2020   Chronic abdominal pain 10/06/2020   Irritable colon 08/02/2020   Pelvic floor dysfunction 08/02/2020   Irritant dermatitis 08/02/2020   Gastroesophageal reflux disease 06/28/2020   Severe recurrent major depression without psychotic features (HCC) 06/28/2020   Polyarthralgia 06/28/2020   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 06/28/2020   Tachycardia 06/28/2020   History of COVID-19 06/28/2020   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 deficit 06/28/2020   Recurrent major depressive disorder, in full remission (HCC) 06/28/2020   Atypical chest pain 06/22/2020   Dizziness 06/22/2020   Palpitations 06/14/2020   Anxiety  05/04/2020   Internal and external hemorrhoids without complication 04/29/2020   Nondiabetic gastroparesis 03/03/2020   ETD (Eustachian tube dysfunction), bilateral 10/21/2019   Acne vulgaris 03/11/2014   Mild intermittent asthma without complication 03/11/2014   Seasonal allergies 03/11/2014     REFERRING PROVIDER:  Horton Chin, MD     REFERRING DIAG:  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 Mid-upper back pins and needles. Cycle started about a week late and I am on day 18 of cramping and bleeding. Took a plan B on Sat. Pelvic/abdominal US this morning and f/u on the 27th.   PERTINENT HISTORY:  See hx  PAIN:  Are you having pain? Yes: NPRS scale: 7 and gets worse/10 Pain location: bil shoulders, thoracic region Pain description: hurts Aggravating factors: not mindful of it Relieving factors: not mindful of it  PRECAUTIONS:  None  WEIGHT BEARING RESTRICTIONS:  No  FALLS:  Has patient fallen in last 6 months? 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    TREATMENT:  DATE: 6/4 Manual: STM bil upper traps, supraspinatus, rhomboids, thoracic paraspinals; gross rib and thoracic mobilizations  Seated upper trap stretch with rib mob Seated ER red tband Seated  horizontal abduction    PATIENT EDUCATION:  Education details: Anatomy of condition, POC, HEP, exercise form/rationale Person educated: Patient Education method: Explanation, Demonstration, Tactile cues, Verbal cues, and Handouts Education comprehension: verbalized understanding, returned demonstration, verbal cues required, tactile cues required, and needs further education  HOME EXERCISE PROGRAM: [id number]   ASSESSMENT:  CLINICAL IMPRESSION: Patient is a 27 y.o. F who was seen today for physical therapy evaluation and treatment for cervicalgia. Pt is hypermobile, as consistent with EDS, resulting in high demand for muscular activation in posture. Reports feeling fatigue in proper upright posture. Pt is very thin and will benefit from strengthening exercises to improve muscle bulk for gross strength. S/s consistent with MSK weakness resulting in lack of postural endurance and increase in pain. Pt has experienced bleeding for 18 days now and is on medication to address this- she is anemic and will need to be monitored through exercise.     REHAB POTENTIAL: Fair    CLINICAL DECISION MAKING: Evolving/moderate complexity  EVALUATION COMPLEXITY: Moderate   GOALS: Goals reviewed with patient? Yes  SHORT TERM GOALS: Target date: 6/22  Pt will demo proper upright posture without VC Baseline: Goal status: INITIAL   LONG TERM GOALS: Target date: POC date  10 min pain free in a position Baseline:  Goal status: INITIAL  2.  Able to utilize stretches and exercises while at work for pain management Baseline:  Goal status: INITIAL  3.  Independent in long term periscapular strengthening program  Baseline:  Goal status: INITIAL    PLAN:  PT FREQUENCY: 1x/week  PT DURATION: 6 weeks  PLANNED INTERVENTIONS: Therapeutic exercises, Therapeutic activity, Neuromuscular re-education, Balance training, Patient/Family education, Self Care, Joint mobilization, Aquatic Therapy, Dry  Needling, Electrical stimulation, Spinal mobilization, Cryotherapy, Moist heat, Taping, Traction, Ionotophoresis 4mg /ml Dexamethasone, Manual therapy, and Re-evaluation.  PLAN FOR NEXT SESSION: continue mobilization/STM and periscap strength   Juanantonio Stolar C. Saidee Geremia PT, DPT 10/24/22 4:45 PM

## 2022-10-26 ENCOUNTER — Encounter: Payer: Self-pay | Admitting: Obstetrics & Gynecology

## 2022-10-26 ENCOUNTER — Telehealth (INDEPENDENT_AMBULATORY_CARE_PROVIDER_SITE_OTHER): Payer: BC Managed Care – PPO | Admitting: Obstetrics & Gynecology

## 2022-10-26 DIAGNOSIS — E282 Polycystic ovarian syndrome: Secondary | ICD-10-CM | POA: Insufficient documentation

## 2022-10-26 DIAGNOSIS — N939 Abnormal uterine and vaginal bleeding, unspecified: Secondary | ICD-10-CM | POA: Diagnosis not present

## 2022-10-26 DIAGNOSIS — G8929 Other chronic pain: Secondary | ICD-10-CM | POA: Diagnosis not present

## 2022-10-26 DIAGNOSIS — R102 Pelvic and perineal pain: Secondary | ICD-10-CM | POA: Diagnosis not present

## 2022-10-26 HISTORY — DX: Polycystic ovarian syndrome: E28.2

## 2022-10-26 MED ORDER — NORETHINDRONE ACETATE 5 MG PO TABS
10.0000 mg | ORAL_TABLET | Freq: Every day | ORAL | 2 refills | Status: DC
Start: 2022-10-26 — End: 2022-11-01

## 2022-10-26 NOTE — Progress Notes (Signed)
GYNECOLOGY VIRTUAL VISIT ENCOUNTER NOTE  Provider location: [facility] [place]   Patient location: Home  I connected with Brianna [name] on 10/26/22 at 10:50 AM EDT by MyChart Video Encounter and verified that I am speaking with the correct person using two identifiers.   I discussed the limitations, risks, security and privacy concerns of performing an evaluation and management service virtually and the availability of in person appointments. I also discussed with the patient that there may be a patient responsible charge related to this service. The patient expressed understanding and agreed to proceed.   History:  Brianna [name] is a 27 y.o. [id number] female being evaluated today for ongoing abnormal uterine bleeding (AUB). See in [facility] on 10/21/22 for this, was started on Provera 10 mg po daily and outpatient ultrasound was ordered which was done on 10/24/22.   Today, she reports continued bleeding and pain.  She reports history of PCOS, was diagnosed in past by an endocrinologist.  She is concerned she has endometriosis, wants diagnostic laparoscopy.  She denies any abnormal vaginal discharge or other concerns.      Past Medical History:  Diagnosis Date   Acute recurrent pansinusitis 10/21/2019   ADHD (attention deficit hyperactivity disorder)    inattentive   Anemia    had iron infusions in Fall of 2023   Anxiety    Asthma    Bronchitis    Cat allergy due to both airborne and skin contact 05/01/2016   Chronic sore throat 05/01/2016   COVID 2020   Depression    Fibromyalgia    pt denies   Gastroparesis    GERD (gastroesophageal reflux disease)    History of hypotension    taken midodrine in the past- Spring of 2022   Hypermobile Ehlers-Danlos syndrome    Irregular heart rate    Keratosis pilaris    Migraine    PCOS (polycystic ovarian syndrome) 10/26/2022   PONV (postoperative nausea and vomiting)    "I can get really cold"   PTSD  (post-traumatic stress disorder)    Seasonal allergic rhinitis 03/11/2014   Past Surgical History:  Procedure Laterality Date   CHOLECYSTECTOMY  05/09/2021   ESOPHAGOGASTRODUODENOSCOPY ENDOSCOPY     03/2020   HIP ARTHROSCOPY Right 04/25/2022   Procedure: RIGHT HIP ARTHROSCOPY WITH LABRAL RECONSTRUCTION, ILIOTIBIAL BAND ALLOGRAFT, PSOAS RELEASE;  Surgeon: [name], MD;  Location: [facility];  Service: Orthopedics;  Laterality: Right;   LABRAL REPAIR Right 03/02/2021   Procedure: RIGHT HIP ARTHROSCOPY WITH LABRAL REPAIR AND PINCE DEBRIDEMENT;  Surgeon: [name], MD;  Location: [facility] OR;  Service: Orthopedics;  Laterality: Right;   SMART PILL PROCEDURE     12/2020   TYMPANOSTOMY TUBE PLACEMENT Bilateral    WISDOM TOOTH EXTRACTION     The following portions of the patient's history were reviewed and updated as appropriate: allergies, current medications, past family history, past medical history, past social history, past surgical history and problem list.   Health Maintenance:  Normal pap on 01/10/2021.   Review of Systems:  Pertinent items noted in HPI and remainder of comprehensive ROS otherwise negative.  Physical Exam:   General:  Alert, oriented and cooperative. Patient appears to be in no acute distress.  Mental Status: Normal mood and affect. Normal behavior. Normal judgment and thought content.   Respiratory: Normal respiratory effort, no problems with respiration noted  Rest of physical exam deferred due to type of encounter  Labs and Imaging Results for orders placed or  performed during the hospital encounter of 10/21/22 (from the past 336 hour(s))  Pregnancy, urine POC   Collection Time: 10/21/22  9:37 AM  Result Value Ref Range   Preg Test, Ur NEGATIVE NEGATIVE  CBC   Collection Time: 10/21/22 10:26 AM  Result Value Ref Range   WBC 5.1 4.0 - 10.5 K/uL   RBC 4.28 3.87 - 5.11 MIL/uL   Hemoglobin 13.4 12.0 - 15.0 g/dL   HCT 16.1 09.6 - 04.5 %   MCV 93.7 80.0 -  100.0 fL   MCH 31.3 26.0 - 34.0 pg   MCHC 33.4 30.0 - 36.0 g/dL   RDW 40.9 81.1 - 91.4 %   Platelets 215 150 - 400 K/uL   nRBC 0.0 0.0 - 0.2 %   US PELVIC COMPLETE WITH TRANSVAGINAL  Result Date: 10/24/2022 CLINICAL DATA:  Vaginal bleeding. EXAM: TRANSABDOMINAL AND TRANSVAGINAL ULTRASOUND OF PELVIS TECHNIQUE: Both transabdominal and transvaginal ultrasound examinations of the pelvis were performed. Transabdominal technique was performed for global imaging of the pelvis including uterus, ovaries, adnexal regions, and pelvic cul-de-sac. It was necessary to proceed with endovaginal exam following the transabdominal exam to visualize the endometrium. COMPARISON:  None Available. FINDINGS: Uterus Measurements: 7.8 x 3.3 x 5.2 cm = volume: 71.2 mL. There is a 1.2 x 1.6 x 1.5 cm intramural fibroid within the uterine fundus. Endometrium Thickness: 4.2 mm.  No focal abnormality visualized. Right ovary Measurements: 3.8 x 2.4 x 2.9 cm = volume: 13.9 mL. No evidence of ovarian mass. Numerous small, less than 1cm, follicles, without evidence of dominant follicle or corpus luteum. Left ovary Measurements: 3.1 x 2.4 x 2.1 cm = volume: 7.9 mL. No evidence of ovarian mass. Numerous small, less than 1cm, follicles, without evidence of dominant follicle or corpus luteum. Other findings No abnormal free fluid. IMPRESSION: 1. Endometrium measures 4.2 mm. If bleeding remains unresponsive to hormonal or medical therapy, sonohysterogram should be considered for focal lesion work-up. (Ref: Radiological Reasoning: Algorithmic Workup of Abnormal Vaginal Bleeding with Endovaginal Sonography and Sonohysterography. AJR 2008; 782:N56-21) 2. Numerous small bilateral ovarian follicles, without evidence of dominant follicle or corpus luteum. These findings can be seen with polycystic ovary syndrome; recommend clinical correlation and consider biochemical testing. Electronically Signed   By: Annia Belt M.D.   On: 10/24/2022 23:06   DG  FLUORO GUIDED NEEDLE PLC ASPIRATION/INJECTION LOC  Result Date: 10/17/2022 CLINICAL DATA:  Chronic right hip pain. History of labral repair and reconstruction. 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 Right iliopsoas bursa injection requested. EXAM: RIGHT ILIOPSOAS BURSA INJECTION UNDER FLUOROSCOPY COMPARISON:  MRI right hip dated January 30, 2022. FLUOROSCOPY: Radiation Exposure Index (as provided by the fluoroscopic device): 0.4 mGy Kerma PROCEDURE: The risks and benefits of the procedure were discussed with the patient, and written informed consent was obtained. The patient stated no history of allergy to contrast media. A formal timeout procedure was performed with the patient according to departmental protocol. The patient was placed supine on the fluoroscopy table and the right hip joint was identified under fluoroscopy. The skin overlying the right hip joint was subsequently cleaned with Betadine and a sterile drape was placed over the area of interest. 2 ml 1% Lidocaine was used to anesthetize the skin around the needle insertion site. A 22 gauge spinal needle was inserted onto the right mid anterior acetabulum under fluoroscopy. Diagnostic injection of 2 ml Isovue-M 200 iodinated contrast demonstrates linear intra-bursal spread along the right iliopsoas tendon without intravascular component. 40mg  Depo-Medrol and 2.5 ml  bupivacaine 0.25% were then administered into the right iliopsoas bursa. The needle was removed and hemostasis was achieved. There were no immediate complications. IMPRESSION: Technically successful right iliopsoas bursa injection under fluoroscopy. Electronically Signed   By: Obie Dredge M.D.   On: 10/17/2022 12:22       Assessment and Plan:     1. Chronic pelvic pain in female 2. Abnormal uterine bleeding 3. PCOS (polycystic ovarian syndrome) Ultrasound results reviewed, has small intramural fibroid and polycystic ovaries. Recommended increase in dosage of Provera  to 20  -30 mg qd to manage her AUB or trial of other form of progestin (Aygestin); she wants to try Aygestin.  This was prescribed, can be increased to 15 mg daily if needed.  This will also presumptively treat endometriosis if present.  Patient is insistent on getting surgery; she was told this was not indicated but she desires this soon.  Due to ongoing issues with scheduling surgeries at [facility], she was told this may not happen for a long time.  I discussed that she can be seen by other groups that can operate at other surgical centers, other systems.  She desires referral to [facility], this was given to her.  Of note, she also reported she has been seen by [facility], she will call them.  She is also researching other private groups around [place].  Does not want to go to [facility].  She does already have a scheduled appointment with Dr. Para March on 11/16/22, she was told that Dr. Para March may have a better/updated information about surgical scheduling issues at that time.  - Ambulatory referral to Gynecology - norethindrone (AYGESTIN) 5 MG tablet; Take 2 tablets (10 mg total) by mouth daily.  Dispense: 60 tablet; Refill: 2      I discussed the assessment and treatment plan with the patient. The patient was provided an opportunity to ask questions and all were answered. The patient agreed with the plan and demonstrated an understanding of the instructions.   The patient was advised to call back or seek an in-person evaluation/go to the ED if the symptoms worsen or if the condition fails to improve as anticipated.  I provided 25 minutes of face-to-face time during this encounter.   Jaynie Collins, MD [facility] for [employer], [facility]

## 2022-10-27 ENCOUNTER — Encounter (HOSPITAL_BASED_OUTPATIENT_CLINIC_OR_DEPARTMENT_OTHER): Payer: Self-pay | Admitting: Orthopaedic Surgery

## 2022-10-27 ENCOUNTER — Ambulatory Visit (HOSPITAL_BASED_OUTPATIENT_CLINIC_OR_DEPARTMENT_OTHER): Payer: BC Managed Care – PPO | Admitting: Orthopaedic Surgery

## 2022-10-27 DIAGNOSIS — S73191A Other sprain of right hip, initial encounter: Secondary | ICD-10-CM | POA: Diagnosis not present

## 2022-10-27 NOTE — Progress Notes (Signed)
Post Operative Evaluation    Procedure/Date of Surgery: Right hip arthroscopy with labral reconstruction 12/5  Interval History:   10/27/2022: Presents today for follow-up status post right hip arthroscopy.  Overall she is still having persistent clicking and popping consistent with psoas symptoms.  This is quite bothersome to her.  She has been having a very difficult time with her boyfriend recently and there has been a social component of stress from this. PMH/PSH/Family History/Social History/Meds/Allergies:    Past Medical History:  Diagnosis Date   Acute recurrent pansinusitis 10/21/2019   ADHD (attention deficit hyperactivity disorder)    inattentive   Anemia    had iron infusions in Fall of 2023   Anxiety    Asthma    Bronchitis    Cat allergy due to both airborne and skin contact 05/01/2016   Chronic sore throat 05/01/2016   COVID 2020   Depression    Fibromyalgia    pt denies   Gastroparesis    GERD (gastroesophageal reflux disease)    History of hypotension    taken midodrine in the past- Spring of 2022   Hypermobile Ehlers-Danlos syndrome    Irregular heart rate    Keratosis pilaris    Migraine    PCOS (polycystic ovarian syndrome) 10/26/2022   PONV (postoperative nausea and vomiting)    "I can get really cold"   PTSD (post-traumatic stress disorder)    Seasonal allergic rhinitis 03/11/2014   Past Surgical History:  Procedure Laterality Date   CHOLECYSTECTOMY  05/09/2021   ESOPHAGOGASTRODUODENOSCOPY ENDOSCOPY     03/2020   HIP ARTHROSCOPY Right 04/25/2022   Procedure: RIGHT HIP ARTHROSCOPY WITH LABRAL RECONSTRUCTION, ILIOTIBIAL BAND ALLOGRAFT, PSOAS RELEASE;  Surgeon: [name], MD;  Location: [facility] OR;  Service: Orthopedics;  Laterality: Right;   LABRAL REPAIR Right 03/02/2021   Procedure: RIGHT HIP ARTHROSCOPY WITH LABRAL REPAIR AND PINCE DEBRIDEMENT;  Surgeon: [name], MD;  Location: [facility];  Service:  Orthopedics;  Laterality: Right;   SMART PILL PROCEDURE     12/2020   TYMPANOSTOMY TUBE PLACEMENT Bilateral    WISDOM TOOTH EXTRACTION     Social History   Socioeconomic History   Marital status: Single    Spouse name: Not on file   Number of children: Not on file   Years of education: Not on file   Highest education level: Not on file  Occupational History   Not on file  Tobacco Use   Smoking status: Former    Types: E-cigarettes, Cigarettes    Quit date: 2021    Years since quitting: 3.4   Smokeless tobacco: Never  Vaping Use   Vaping Use: Former   Substances: Financial trader  Substance and Sexual Activity   Alcohol use: Not Currently   Drug use: Yes    Frequency: 7.0 times per week    Types: Marijuana   Sexual activity: Yes    Birth control/protection: None  Other Topics Concern   Not on file  Social History Narrative   Right handed   Social Determinants of Health   Financial Resource Strain: Not on file  Food Insecurity: Not on file  Transportation Needs: Not on file  Physical Activity: Not on file  Stress: Not on file  Social Connections: Not on file   Family History  Problem Relation Age  of Onset   Diabetes Mother    Polycystic ovary syndrome Mother    GER disease Father    Heart disease Paternal Uncle    Allergies  Allergen Reactions   Clindamycin Hives, Rash and Swelling    Other reaction(s): swelling   Tretinoin Dermatitis, Itching, Photosensitivity, Swelling and Other (See Comments)   Current Outpatient Medications  Medication Sig Dispense Refill   Cholecalciferol 325 MCG (13000 UT) CAPS Take by mouth.     Cobalamin Combinations (B-12) [phone] MCG SUBL Take 1 tablet by mouth daily.     EFFER-K 10 MEQ TBEF Take 1 tablet by mouth daily.     folic acid (FOLVITE) 1 MG tablet Take by mouth.     norethindrone (AYGESTIN) 5 MG tablet Take 2 tablets (10 mg total) by mouth daily. 60 tablet 2   Omega-3 Fatty Acids (FISH OIL PO) Take 1,400 Units  by mouth daily. Mini     ondansetron (ZOFRAN ODT) 4 MG disintegrating tablet Take 1 tablet (4 mg total) by mouth every 8 (eight) hours as needed for nausea or vomiting. 20 tablet 5   Pediatric Multiple Vitamins (PEDIATRIC MULTIVITAMIN) chewable tablet Chew 1 tablet by mouth daily.     prochlorperazine (COMPAZINE) 10 MG tablet      promethazine (PHENERGAN) 25 MG tablet Take 1 tablet (25 mg total) by mouth every 6 (six) hours as needed for nausea or vomiting. (Patient taking differently: Take 12.5 mg by mouth every 6 (six) hours as needed for nausea or vomiting.) 30 tablet 0   tazarotene (TAZORAC) 0.05 % cream Apply 1 Application topically every other day.     Tazarotene 0.045 % LOTN Apply topically.     triamcinolone cream (KENALOG) 0.1 % Apply 1 Application topically daily as needed (Eczma).     venlafaxine XR (EFFEXOR-XR) 37.5 MG 24 hr capsule Take by mouth.     No current facility-administered medications for this visit.   No results found.  Review of Systems:   A ROS was performed including pertinent positives and negatives as documented in the HPI.   Musculoskeletal Exam:    Right hip incisions are healed.  Range of motion is 30 degrees internal rotation and 35 degrees external rotation without significant pain.  Tenderness deep in the hip with a palpable click with flexion of the hip and the laying position  Imaging:    None  I personally reviewed and interpreted the radiographs.   Assessment:   27 year old female status post right hip arthroscopic labral reconstruction.  She is persistently having popping systems consistent with psoas tendinitis.  She did get an x-ray guided psoas injection with Dr. Aundria Rud and they did discuss briefly psoas release.  At this time I personally believe that I would not recommend a psoas release as I do believe that this would exacerbate her instability but that being said she is going to go for an opinion on this. Plan :    -Return to clinic as  needed   I personally saw and evaluated the patient, and participated in the management and treatment plan.  [name], MD Attending Physician, Orthopedic Surgery  This document was dictated using Dragon voice recognition software. A reasonable attempt at proof reading has been made to minimize errors.

## 2022-10-31 ENCOUNTER — Encounter (HOSPITAL_BASED_OUTPATIENT_CLINIC_OR_DEPARTMENT_OTHER): Payer: Self-pay | Admitting: Physical Therapy

## 2022-11-01 ENCOUNTER — Other Ambulatory Visit: Payer: Self-pay | Admitting: Obstetrics & Gynecology

## 2022-11-01 MED ORDER — MEDROXYPROGESTERONE ACETATE 10 MG PO TABS: ORAL_TABLET | ORAL | 0 refills | Status: DC

## 2022-11-01 MED ORDER — TRAMADOL HCL 50 MG PO TABS: 50.0000 mg | ORAL_TABLET | Freq: Four times a day (QID) | ORAL | 0 refills | Status: AC | PRN

## 2022-11-01 NOTE — Progress Notes (Signed)
Patient called and is having increased bleeding.  She stopped her Aygestin a couple days ago because she was worried about hair loss associated with the medication.  I explained to her that stopping progesterone will cause an increase in bleeding.  We discussed restarting Aygestin, increasing Provera, or starting Megace.  The patient chooses to restart Provera at increased dose.  The patient has an appointment with a gynecologist at [facility] on Friday.  She would like a diagnostic laparoscopy and we are unable to accommodate that in short notice at [facility].  The patient is having increased pain.  She has gastroparesis and needs to avoid NSAIDs and narcotics.  After brief literature search, tramadol has not been shown to delay gastric emptying and patient was given a prescription for tramadol.

## 2022-11-03 ENCOUNTER — Ambulatory Visit (HOSPITAL_BASED_OUTPATIENT_CLINIC_OR_DEPARTMENT_OTHER): Payer: BC Managed Care – PPO | Admitting: Orthopaedic Surgery

## 2022-11-06 ENCOUNTER — Ambulatory Visit (HOSPITAL_BASED_OUTPATIENT_CLINIC_OR_DEPARTMENT_OTHER): Payer: BC Managed Care – PPO | Admitting: Physical Therapy

## 2022-11-06 ENCOUNTER — Encounter (HOSPITAL_BASED_OUTPATIENT_CLINIC_OR_DEPARTMENT_OTHER): Payer: Self-pay | Admitting: Physical Therapy

## 2022-11-06 DIAGNOSIS — M542 Cervicalgia: Secondary | ICD-10-CM

## 2022-11-06 DIAGNOSIS — M25551 Pain in right hip: Secondary | ICD-10-CM | POA: Diagnosis not present

## 2022-11-06 NOTE — Therapy (Signed)
OUTPATIENT PHYSICAL THERAPY EVALUATION   Patient Name: Brianna [name] MRN: [id number] DOB:[birth year], 27 y.o., female Today's Date: 11/06/2022  END OF SESSION:  PT End of Session - 11/06/22 1520     Visit Number 2    Number of Visits 7    Date for PT Re-Evaluation 12/09/22    Authorization Type BCBS    PT Start Time 1519    PT Stop Time 1605    PT Time Calculation (min) 46 min    Activity Tolerance Patient tolerated treatment well    Behavior During Therapy Flat affect              Past Medical History:  Diagnosis Date   Acute recurrent pansinusitis 10/21/2019   ADHD (attention deficit hyperactivity disorder)    inattentive   Anemia    had iron infusions in Fall of 2023   Anxiety    Asthma    Bronchitis    Cat allergy due to both airborne and skin contact 05/01/2016   Chronic sore throat 05/01/2016   COVID 2020   Depression    Fibromyalgia    pt denies   Gastroparesis    GERD (gastroesophageal reflux disease)    History of hypotension    taken midodrine in the past- Spring of 2022   Hypermobile Ehlers-Danlos syndrome    Irregular heart rate    Keratosis pilaris    Migraine    PCOS (polycystic ovarian syndrome) 10/26/2022   PONV (postoperative nausea and vomiting)    "I can get really cold"   PTSD (post-traumatic stress disorder)    Seasonal allergic rhinitis 03/11/2014   Past Surgical History:  Procedure Laterality Date   CHOLECYSTECTOMY  05/09/2021   ESOPHAGOGASTRODUODENOSCOPY ENDOSCOPY     03/2020   HIP ARTHROSCOPY Right 04/25/2022   Procedure: RIGHT HIP ARTHROSCOPY WITH LABRAL RECONSTRUCTION, ILIOTIBIAL BAND ALLOGRAFT, PSOAS RELEASE;  Surgeon: [name], MD;  [facility];  Service: Orthopedics;  Laterality: Right;   LABRAL REPAIR Right 03/02/2021   Procedure: RIGHT HIP ARTHROSCOPY WITH LABRAL REPAIR AND PINCE DEBRIDEMENT;  Surgeon: [name], MD;  Location: [facility];  Service: Orthopedics;  Laterality: Right;   SMART PILL  PROCEDURE     12/2020   TYMPANOSTOMY TUBE PLACEMENT Bilateral    WISDOM TOOTH EXTRACTION     Patient Active Problem List   Diagnosis Date Noted   PCOS (polycystic ovarian syndrome) 10/26/2022   Cervical adenopathy 06/10/2021   Chronic pruritus 06/10/2021   Exercise-induced asthma 06/09/2021   Chronic venous insufficiency 06/03/2021   Orthostatic lightheadedness 06/03/2021   Purple toe syndrome of both feet (HCC) 06/03/2021   Autonomic dysfunction 06/03/2021   Tear of right acetabular labrum 04/13/2021   Migraine headache 03/01/2021   Headache 11/30/2020   Positive ANA (antinuclear antibody) 11/15/2020   Fibromyalgia 11/02/2020   Hair loss 11/02/2020   Muscle spasticity 10/22/2020   Hypermobile Ehlers-Danlos syndrome 10/22/2020   Paresthesia of bilateral legs 10/06/2020   Myalgia 10/06/2020   Chronic pelvic pain in female 10/06/2020   Chronic abdominal pain 10/06/2020   Irritable colon 08/02/2020   Pelvic floor dysfunction 08/02/2020   Irritant dermatitis 08/02/2020   Gastroesophageal reflux disease 06/28/2020   Severe recurrent major depression without psychotic features (HCC) 06/28/2020   Polyarthralgia 06/28/2020   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 06/28/2020   Tachycardia 06/28/2020   History of COVID-19 06/28/2020   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 deficit 06/28/2020   Recurrent major depressive disorder, in full remission (HCC) 06/28/2020  Atypical chest pain 06/22/2020   Dizziness 06/22/2020   Palpitations 06/14/2020   Anxiety 05/04/2020   Internal and external hemorrhoids without complication 04/29/2020   Nondiabetic gastroparesis 03/03/2020   ETD (Eustachian tube dysfunction), bilateral 10/21/2019   Acne vulgaris 03/11/2014   Mild intermittent asthma without complication 03/11/2014   Seasonal allergies 03/11/2014     REFERRING PROVIDER:  Horton Chin, MD     REFERRING DIAG:  M79.18 (ICD-10-CM) - Cervical  myofascial pain syndrome      Rationale for Evaluation and Treatment: Rehabilitation  THERAPY DIAG:  Neck pain  ONSET DATE: chronic   SUBJECTIVE:                                                                                                                                                                                           SUBJECTIVE STATEMENT: About the same. Exercises every other day. Reports HA from occipital to frontal region.   PERTINENT HISTORY:  See hx  PAIN:  Are you having pain? Yes: NPRS scale: 6/10 Pain location: bil shoulders, thoracic region Pain description: hurts Aggravating factors: not mindful of it Relieving factors: not mindful of it  PRECAUTIONS:  None  WEIGHT BEARING RESTRICTIONS:  No  FALLS:  Has patient fallen in last 6 months? 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    TREATMENT:  Treatment                            6/17:  MANUAL: prone thoracic and first rib mobilization, periscapular & suboccipital musculature Shaker maneuver (able to hold 4s today with good form) Cupping performed by Riki Altes- static cupping x4 to mid thoracolumbar region- paraspinals    DATE: 6/4 Manual: STM bil upper traps,  supraspinatus, rhomboids, thoracic paraspinals; gross rib and thoracic mobilizations  Seated upper trap stretch with rib mob Seated ER red tband Seated horizontal abduction    PATIENT EDUCATION:  Education details: Teacher, music of condition, POC, HEP, exercise form/rationale Person educated: Patient Education method: Explanation, Demonstration, Tactile cues, Verbal cues, and Handouts Education comprehension: verbalized understanding, returned demonstration, verbal cues required, tactile cues required, and needs further education  HOME EXERCISE PROGRAM: [id number]   ASSESSMENT:  CLINICAL IMPRESSION: able to reduce right-sided rib rotation with cupping today. Will continue to use PRN.    REHAB POTENTIAL: Fair    CLINICAL DECISION MAKING: Evolving/moderate complexity  EVALUATION COMPLEXITY: Moderate   GOALS: Goals reviewed with patient? Yes  SHORT TERM GOALS: Target date: 6/22  Pt will demo proper upright posture without VC Baseline: Goal status: INITIAL   LONG TERM GOALS: Target date: POC date  10 min pain free in a position Baseline:  Goal status: INITIAL  2.  Able to utilize stretches and exercises while at work for pain management Baseline:  Goal status: INITIAL  3.  Independent in long term periscapular strengthening program  Baseline:  Goal status: INITIAL    PLAN:  PT FREQUENCY: 1x/week  PT DURATION: 6 weeks  PLANNED INTERVENTIONS: Therapeutic exercises, Therapeutic activity, Neuromuscular re-education, Balance training, Patient/Family education, Self Care, Joint mobilization, Aquatic Therapy, Dry Needling, Electrical stimulation, Spinal mobilization, Cryotherapy, Moist heat, Taping, Traction, Ionotophoresis 4mg /ml Dexamethasone, Manual therapy, and Re-evaluation.  PLAN FOR NEXT SESSION: continue mobilization/STM and periscap strength   Tatsuo Musial C. Luvina Poirier PT, DPT 11/06/22 7:45 PM

## 2022-11-07 ENCOUNTER — Encounter: Payer: Self-pay | Admitting: *Deleted

## 2022-11-08 ENCOUNTER — Other Ambulatory Visit: Payer: Self-pay | Admitting: Obstetrics & Gynecology

## 2022-11-16 ENCOUNTER — Encounter: Payer: Self-pay | Admitting: Obstetrics and Gynecology

## 2022-11-16 ENCOUNTER — Encounter (HOSPITAL_BASED_OUTPATIENT_CLINIC_OR_DEPARTMENT_OTHER): Payer: BC Managed Care – PPO | Admitting: Physical Therapy

## 2022-11-16 ENCOUNTER — Telehealth (INDEPENDENT_AMBULATORY_CARE_PROVIDER_SITE_OTHER): Payer: BC Managed Care – PPO | Admitting: Obstetrics and Gynecology

## 2022-11-16 DIAGNOSIS — N939 Abnormal uterine and vaginal bleeding, unspecified: Secondary | ICD-10-CM | POA: Diagnosis not present

## 2022-11-16 DIAGNOSIS — N946 Dysmenorrhea, unspecified: Secondary | ICD-10-CM

## 2022-11-16 NOTE — Progress Notes (Signed)
GYNECOLOGY VIRTUAL VISIT ENCOUNTER NOTE  Provider location: Center for [facility] at [place]   Patient location: Home  I connected with Brianna [name] on 11/16/22 at  3:10 PM EDT by MyChart Video Encounter and verified that I am speaking with the correct person using two identifiers.   I discussed the limitations, risks, security and privacy concerns of performing an evaluation and management service virtually and the availability of in person appointments. I also discussed with the patient that there may be a patient responsible charge related to this service. The patient expressed understanding and agreed to proceed.   History:  Brianna [name] is a 27 y.o. [id number] female being evaluated today for AUB/painful periods.    Historically:  Has known PCOS diagnosed through [place].   01/2022: Seen for hair loss 04/2022: Seen for night sweats 08/2022: Seen for the above as well. Menses noted to be q23-25d  Has tried POPs. Has migraines with aura so not a candidate for combined OCPs. Has tried Spironolactone. Has tried other medications through endocrinologist.   5/18: Usual period but then bleeding continued after her usual period stopped. Bleeding was brown and c/w BTB.  10/21/22: Seen in [facility] for AUB. Started on Provera.  10/26/22: Continue bleeding and pain. Expressed concern for endometriosis. Suggested Aygestin vs higher Provera dose.  11/01/22: Phone call with Dr. Penne Lash - continued bleeding. She didn't like the Aygestin due to risk of hair loss. She was started on Tramadol as not associated with affecting gastric emptying. Ongoing pain and bleeding.  11/03/22: See at [facility]. Recommended PFPT. Recommended Provera until bleeding slows or stops and then start Slynd continuously.    Since that time she has been better controlled with her AUB. She had previously tried [employer] and thought her night sweats were from that  but they were still present after she stopped  the [facility].   She has appt with ortho for possible right hip replacement on July 9th. She is currently in an intensive outpatient therapy program and is on FMLA.     Past Medical History:  Diagnosis Date   Acute recurrent pansinusitis 10/21/2019   ADHD (attention deficit hyperactivity disorder)    inattentive   Anemia    had iron infusions in Fall of 2023   Anxiety    Asthma    Bronchitis    Cat allergy due to both airborne and skin contact 05/01/2016   Chronic sore throat 05/01/2016   COVID 2020   Depression    Fibromyalgia    pt denies   Gastroparesis    GERD (gastroesophageal reflux disease)    History of hypotension    taken midodrine in the past- Spring of 2022   Hypermobile Ehlers-Danlos syndrome    Irregular heart rate    Keratosis pilaris    Migraine    PCOS (polycystic ovarian syndrome) 10/26/2022   PONV (postoperative nausea and vomiting)    "I can get really cold"   PTSD (post-traumatic stress disorder)    Seasonal allergic rhinitis 03/11/2014   Past Surgical History:  Procedure Laterality Date   CHOLECYSTECTOMY  05/09/2021   ESOPHAGOGASTRODUODENOSCOPY ENDOSCOPY     03/2020   HIP ARTHROSCOPY Right 04/25/2022   Procedure: RIGHT HIP ARTHROSCOPY WITH LABRAL RECONSTRUCTION, ILIOTIBIAL BAND ALLOGRAFT, PSOAS RELEASE;  Surgeon: [name], MD;  Location: [facility] OR;  Service: Orthopedics;  Laterality: Right;   LABRAL REPAIR Right 03/02/2021   Procedure: RIGHT HIP ARTHROSCOPY WITH LABRAL REPAIR AND PINCE DEBRIDEMENT;  Surgeon: [name], MD;  Location: [facility] OR;  Service: Orthopedics;  Laterality: Right;   SMART PILL PROCEDURE     12/2020   TYMPANOSTOMY TUBE PLACEMENT Bilateral    WISDOM TOOTH EXTRACTION     The following portions of the patient's history were reviewed and updated as appropriate: allergies, current medications, past family history, past medical history, past social history, past surgical history and problem list.   Review of Systems:  Pertinent  items noted in HPI and remainder of comprehensive ROS otherwise negative.  Physical Exam:   General:  Alert, oriented and cooperative. Patient appears to be in no acute distress.  Mental Status: Normal mood and affect. Normal behavior. Normal judgment and thought content.   Respiratory: Normal respiratory effort, no problems with respiration noted  Rest of physical exam deferred due to type of encounter  Labs and Imaging No results found for this or any previous visit (from the past 336 hour(s)). US PELVIC COMPLETE WITH TRANSVAGINAL  Result Date: 10/24/2022 CLINICAL DATA:  Vaginal bleeding. EXAM: TRANSABDOMINAL AND TRANSVAGINAL ULTRASOUND OF PELVIS TECHNIQUE: Both transabdominal and transvaginal ultrasound examinations of the pelvis were performed. Transabdominal technique was performed for global imaging of the pelvis including uterus, ovaries, adnexal regions, and pelvic cul-de-sac. It was necessary to proceed with endovaginal exam following the transabdominal exam to visualize the endometrium. COMPARISON:  None Available. FINDINGS: Uterus Measurements: 7.8 x 3.3 x 5.2 cm = volume: 71.2 mL. There is a 1.2 x 1.6 x 1.5 cm intramural fibroid within the uterine fundus. Endometrium Thickness: 4.2 mm.  No focal abnormality visualized. Right ovary Measurements: 3.8 x 2.4 x 2.9 cm = volume: 13.9 mL. No evidence of ovarian mass. Numerous small, less than 1cm, follicles, without evidence of dominant follicle or corpus luteum. Left ovary Measurements: 3.1 x 2.4 x 2.1 cm = volume: 7.9 mL. No evidence of ovarian mass. Numerous small, less than 1cm, follicles, without evidence of dominant follicle or corpus luteum. Other findings No abnormal free fluid. IMPRESSION: 1. Endometrium measures 4.2 mm. If bleeding remains unresponsive to hormonal or medical therapy, sonohysterogram should be considered for focal lesion work-up. (Ref: Radiological Reasoning: Algorithmic Workup of Abnormal Vaginal Bleeding with Endovaginal  Sonography and Sonohysterography. AJR 2008; [id number]) 2. Numerous small bilateral ovarian follicles, without evidence of dominant follicle or corpus luteum. These findings can be seen with polycystic ovary syndrome; recommend clinical correlation and consider biochemical testing. Electronically Signed   By: Annia Belt M.D.   On: 10/24/2022 23:06       Assessment and Plan:     1. Abnormal uterine bleeding (AUB) - Improved on the higher dose of Provera - Discussed IUD and agreed with [facility] team that not systemic would be better. Skin is well controlled now on topical agent so this would be a good time to try this - Reviewed risks of IUD and insertion process.  - Will schedule insertion with me on 7/9 when she has a day off from her current outpt program.   2. Painful menstrual periods - At some point plans L/S for possible endometriosis. Recommended this surgery be done with [facility] for best outcome. She agrees with plan        I discussed the assessment and treatment plan with the patient. The patient was provided an opportunity to ask questions and all were answered. The patient agreed with the plan and demonstrated an understanding of the instructions.   The patient was advised to call back or seek an in-person evaluation/go to the ED if the symptoms worsen  or if the condition fails to improve as anticipated.  I provided 27 minutes of face-to-face time during this encounter.   Milas Hock, MD Center for [facility], [facility]

## 2022-11-20 ENCOUNTER — Ambulatory Visit (HOSPITAL_BASED_OUTPATIENT_CLINIC_OR_DEPARTMENT_OTHER): Payer: BC Managed Care – PPO | Attending: Orthopaedic Surgery | Admitting: Physical Therapy

## 2022-11-20 ENCOUNTER — Encounter (HOSPITAL_BASED_OUTPATIENT_CLINIC_OR_DEPARTMENT_OTHER): Payer: Self-pay | Admitting: Physical Therapy

## 2022-11-20 DIAGNOSIS — M542 Cervicalgia: Secondary | ICD-10-CM | POA: Diagnosis present

## 2022-11-20 NOTE — Therapy (Signed)
OUTPATIENT PHYSICAL THERAPY EVALUATION   Patient Name: Brianna [name] MRN: [id number] DOB:1995-08-26, 27 y.o., female Today's Date: 11/20/2022  END OF SESSION:  PT End of Session - 11/20/22 1604     Visit Number 3    Number of Visits 7    Date for PT Re-Evaluation 12/09/22    Authorization Type BCBS    PT Start Time 1603    PT Stop Time 1641    PT Time Calculation (min) 38 min    Activity Tolerance Patient tolerated treatment well    Behavior During Therapy Flat affect               Past Medical History:  Diagnosis Date   Acute recurrent pansinusitis 10/21/2019   ADHD (attention deficit hyperactivity disorder)    inattentive   Anemia    had iron infusions in Fall of 2023   Anxiety    Asthma    Bronchitis    Cat allergy due to both airborne and skin contact 05/01/2016   Chronic sore throat 05/01/2016   COVID 2020   Depression    Fibromyalgia    pt denies   Gastroparesis    GERD (gastroesophageal reflux disease)    History of hypotension    taken midodrine in the past- Spring of 2022   Hypermobile Ehlers-Danlos syndrome    Irregular heart rate    Keratosis pilaris    Migraine    PCOS (polycystic ovarian syndrome) 10/26/2022   PONV (postoperative nausea and vomiting)    "I can get really cold"   PTSD (post-traumatic stress disorder)    Seasonal allergic rhinitis 03/11/2014   Past Surgical History:  Procedure Laterality Date   CHOLECYSTECTOMY  05/09/2021   ESOPHAGOGASTRODUODENOSCOPY ENDOSCOPY     03/2020   HIP ARTHROSCOPY Right 04/25/2022   Procedure: RIGHT HIP ARTHROSCOPY WITH LABRAL RECONSTRUCTION, ILIOTIBIAL BAND ALLOGRAFT, PSOAS RELEASE;  Surgeon: [name], MD;  [facility];  Service: Orthopedics;  Laterality: Right;   LABRAL REPAIR Right 03/02/2021   Procedure: RIGHT HIP ARTHROSCOPY WITH LABRAL REPAIR AND PINCE DEBRIDEMENT;  Surgeon: [name], MD;  Location: [facility] OR;  Service: Orthopedics;  Laterality: Right;   SMART PILL  PROCEDURE     12/2020   TYMPANOSTOMY TUBE PLACEMENT Bilateral    WISDOM TOOTH EXTRACTION     Patient Active Problem List   Diagnosis Date Noted   PCOS (polycystic ovarian syndrome) 10/26/2022   Cervical adenopathy 06/10/2021   Chronic pruritus 06/10/2021   Exercise-induced asthma 06/09/2021   Chronic venous insufficiency 06/03/2021   Orthostatic lightheadedness 06/03/2021   Purple toe syndrome of both feet (HCC) 06/03/2021   Autonomic dysfunction 06/03/2021   Tear of right acetabular labrum 04/13/2021   Migraine headache 03/01/2021   Headache 11/30/2020   Positive ANA (antinuclear antibody) 11/15/2020   Fibromyalgia 11/02/2020   Hair loss 11/02/2020   Muscle spasticity 10/22/2020   Hypermobile Ehlers-Danlos syndrome 10/22/2020   Paresthesia of bilateral legs 10/06/2020   Myalgia 10/06/2020   Chronic pelvic pain in female 10/06/2020   Chronic abdominal pain 10/06/2020   Irritable colon 08/02/2020   Pelvic floor dysfunction 08/02/2020   Irritant dermatitis 08/02/2020   Gastroesophageal reflux disease 06/28/2020   Severe recurrent major depression without psychotic features (HCC) 06/28/2020   Polyarthralgia 06/28/2020   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 06/28/2020   Tachycardia 06/28/2020   History of COVID-19 06/28/2020   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 deficit 06/28/2020   Recurrent major depressive disorder, in full remission (HCC) 06/28/2020  Atypical chest pain 06/22/2020   Dizziness 06/22/2020   Palpitations 06/14/2020   Anxiety 05/04/2020   Internal and external hemorrhoids without complication 04/29/2020   Nondiabetic gastroparesis 03/03/2020   ETD (Eustachian tube dysfunction), bilateral 10/21/2019   Acne vulgaris 03/11/2014   Mild intermittent asthma without complication 03/11/2014   Seasonal allergies 03/11/2014     REFERRING PROVIDER:  Horton Chin, MD     REFERRING DIAG:  M79.18 (ICD-10-CM) - Cervical  myofascial pain syndrome      Rationale for Evaluation and Treatment: Rehabilitation  THERAPY DIAG:  Neck pain  ONSET DATE: chronic   SUBJECTIVE:                                                                                                                                                                                           SUBJECTIVE STATEMENT: F/u for hip on 7/9. Neck is not great. Pt denies any improvement from PT and neck is very tight. Tingling up and down back. I can't remember how long it helped after last visit. Currently in intensive outpatient program for 5 weeks but will have to leave due to insurance.   PERTINENT HISTORY:  See hx  PAIN:  Are you having pain? Yes: NPRS scale: 6/10 Pain location: bil shoulders, thoracic region Pain description: hurts Aggravating factors: not mindful of it Relieving factors: not mindful of it  PRECAUTIONS:  None  WEIGHT BEARING RESTRICTIONS:  No  FALLS:  Has patient fallen in last 6 months? 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 STM upper traps, bil paraspinals   Treatment                            6/17:  MANUAL: prone thoracic and first rib mobilization, periscapular & suboccipital musculature Shaker maneuver (able to hold 4s today with good form) Cupping performed by Riki Altes- static cupping x4 to mid thoracolumbar region- paraspinals    DATE: 6/4 Manual: STM bil upper traps, supraspinatus, rhomboids, thoracic paraspinals; gross rib and thoracic mobilizations  Seated upper trap stretch with rib mob Seated ER red tband Seated horizontal abduction    PATIENT EDUCATION:  Education details: Teacher, music of condition, POC, HEP, exercise form/rationale Person educated: Patient Education method: Explanation, Demonstration, Tactile cues, Verbal cues, and Handouts Education comprehension: verbalized understanding, returned demonstration, verbal cues required, tactile cues required, and needs further education  HOME EXERCISE PROGRAM: [id number]   ASSESSMENT:  CLINICAL IMPRESSION: Pt will focus on improving frequency of exercises to reinforce short-term gains from manual therapy.    REHAB POTENTIAL: Fair    CLINICAL DECISION MAKING: Evolving/moderate complexity  EVALUATION COMPLEXITY: Moderate   GOALS: Goals reviewed with patient? Yes  SHORT TERM GOALS: Target date: 6/22  Pt will demo proper upright posture without VC Baseline: Goal status:achieved   LONG TERM GOALS: Target date: POC date  10 min pain free in a position Baseline:  Goal status: INITIAL  2.  Able to utilize stretches and exercises while at work for pain management Baseline:  Goal status: INITIAL  3.  Independent in long term periscapular strengthening program  Baseline:  Goal status: INITIAL    PLAN:  PT FREQUENCY: 1x/week  PT DURATION: 6  weeks  PLANNED INTERVENTIONS: Therapeutic exercises, Therapeutic activity, Neuromuscular re-education, Balance training, Patient/Family education, Self Care, Joint mobilization, Aquatic Therapy, Dry Needling, Electrical stimulation, Spinal mobilization, Cryotherapy, Moist heat, Taping, Traction, Ionotophoresis 4mg /ml Dexamethasone, Manual therapy, and Re-evaluation.  PLAN FOR NEXT SESSION: continue mobilization/STM and periscap strength   Sophiagrace Benbrook C. Linda Grimmer PT, DPT 11/20/22 4:41 PM

## 2022-11-27 ENCOUNTER — Encounter (HOSPITAL_BASED_OUTPATIENT_CLINIC_OR_DEPARTMENT_OTHER): Payer: Self-pay | Admitting: Physical Therapy

## 2022-11-27 ENCOUNTER — Ambulatory Visit (HOSPITAL_BASED_OUTPATIENT_CLINIC_OR_DEPARTMENT_OTHER): Payer: BC Managed Care – PPO | Admitting: Physical Therapy

## 2022-11-27 DIAGNOSIS — M542 Cervicalgia: Secondary | ICD-10-CM | POA: Diagnosis not present

## 2022-11-27 NOTE — Therapy (Signed)
OUTPATIENT PHYSICAL THERAPY EVALUATION   Patient Name: Brianna [name] MRN: [id number] DOB:Jan 29, 1996, 27 y.o., female Today's Date: 11/27/2022  END OF SESSION:  PT End of Session - 11/27/22 1519     Visit Number 4    Number of Visits 7    Date for PT Re-Evaluation 12/09/22    Authorization Type BCBS    PT Start Time 1519    PT Stop Time 1550    PT Time Calculation (min) 31 min    Activity Tolerance Patient tolerated treatment well    Behavior During Therapy [facility] for tasks assessed/performed               Past Medical History:  Diagnosis Date   Acute recurrent pansinusitis 10/21/2019   ADHD (attention deficit hyperactivity disorder)    inattentive   Anemia    had iron infusions in Fall of 2023   Anxiety    Asthma    Bronchitis    Cat allergy due to both airborne and skin contact 05/01/2016   Chronic sore throat 05/01/2016   COVID 2020   Depression    Fibromyalgia    pt denies   Gastroparesis    GERD (gastroesophageal reflux disease)    History of hypotension    taken midodrine in the past- Spring of 2022   Hypermobile Ehlers-Danlos syndrome    Irregular heart rate    Keratosis pilaris    Migraine    PCOS (polycystic ovarian syndrome) 10/26/2022   PONV (postoperative nausea and vomiting)    "I can get really cold"   PTSD (post-traumatic stress disorder)    Seasonal allergic rhinitis 03/11/2014   Past Surgical History:  Procedure Laterality Date   CHOLECYSTECTOMY  05/09/2021   ESOPHAGOGASTRODUODENOSCOPY ENDOSCOPY     03/2020   HIP ARTHROSCOPY Right 04/25/2022   Procedure: RIGHT HIP ARTHROSCOPY WITH LABRAL RECONSTRUCTION, ILIOTIBIAL BAND ALLOGRAFT, PSOAS RELEASE;  Surgeon: [name], MD;  [facility];  Service: Orthopedics;  Laterality: Right;   LABRAL REPAIR Right 03/02/2021   Procedure: RIGHT HIP ARTHROSCOPY WITH LABRAL REPAIR AND PINCE DEBRIDEMENT;  Surgeon: [name], MD;  [facility]: [facility];  Service: Orthopedics;  Laterality: Right;    SMART PILL PROCEDURE     12/2020   TYMPANOSTOMY TUBE PLACEMENT Bilateral    WISDOM TOOTH EXTRACTION     Patient Active Problem List   Diagnosis Date Noted   PCOS (polycystic ovarian syndrome) 10/26/2022   Cervical adenopathy 06/10/2021   Chronic pruritus 06/10/2021   Exercise-induced asthma 06/09/2021   Chronic venous insufficiency 06/03/2021   Orthostatic lightheadedness 06/03/2021   Purple toe syndrome of both feet (HCC) 06/03/2021   Autonomic dysfunction 06/03/2021   Tear of right acetabular labrum 04/13/2021   Migraine headache 03/01/2021   Headache 11/30/2020   Positive ANA (antinuclear antibody) 11/15/2020   Fibromyalgia 11/02/2020   Hair loss 11/02/2020   Muscle spasticity 10/22/2020   Hypermobile Ehlers-Danlos syndrome 10/22/2020   Paresthesia of bilateral legs 10/06/2020   Myalgia 10/06/2020   Chronic pelvic pain in female 10/06/2020   Chronic abdominal pain 10/06/2020   Irritable colon 08/02/2020   Pelvic floor dysfunction 08/02/2020   Irritant dermatitis 08/02/2020   Gastroesophageal reflux disease 06/28/2020   Severe recurrent major depression without psychotic features (HCC) 06/28/2020   Polyarthralgia 06/28/2020   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 06/28/2020   Tachycardia 06/28/2020   History of COVID-19 06/28/2020   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 deficit 06/28/2020   Recurrent major depressive disorder, in full remission (HCC)  06/28/2020   Atypical chest pain 06/22/2020   Dizziness 06/22/2020   Palpitations 06/14/2020   Anxiety 05/04/2020   Internal and external hemorrhoids without complication 04/29/2020   Nondiabetic gastroparesis 03/03/2020   ETD (Eustachian tube dysfunction), bilateral 10/21/2019   Acne vulgaris 03/11/2014   Mild intermittent asthma without complication 03/11/2014   Seasonal allergies 03/11/2014     REFERRING PROVIDER:  Horton Chin, MD     REFERRING DIAG:  M79.18  (ICD-10-CM) - Cervical myofascial pain syndrome      Rationale for Evaluation and Treatment: Rehabilitation  THERAPY DIAG:  Neck pain  ONSET DATE: chronic   SUBJECTIVE:                                                                                                                                                                                           SUBJECTIVE STATEMENT: Briefly did exercises daily. The pain is not as intense. HA daily in a halo pattern and Rt temple migraines daily but unsure of timelines. [facility].   PERTINENT HISTORY:  See hx  PAIN:  Are you having pain? Yes: NPRS scale: 5/10 Pain location: bil shoulders, thoracic region Pain description: hurts Aggravating factors: not mindful of it Relieving factors: not mindful of it  PRECAUTIONS:  None  WEIGHT BEARING RESTRICTIONS:  No  FALLS:  Has patient fallen in last 6 months? 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    TREATMENT:  Treatment                            7/8:  Prone STM bil upper traps, levator, Rt paraspinals Prone gross rib mobilization Seated on physioball- row, straight arm pull down, round back, round back with flexion, alt wide row  in round back Trial of boat pose but was irritable to Rt ant hip   Treatment                            7/1:  Supine horiz abd red tband, alt diag Small horiz abd pull on band + GHJ flexion Supine external rotation red tband Shaker maneuver- 2 finger mark under chin Qped horiz abd with ipsilateral cervical rotation MANUAL: prone grade 3 & 4 rib mobs with cavitations x3 noted on left side. STM upper traps, bil paraspinals   Treatment                            6/17:  MANUAL: prone thoracic and first rib mobilization, periscapular & suboccipital musculature Shaker maneuver (able to hold 4s today with good form) Cupping performed by Riki Altes- static cupping x4 to mid thoracolumbar region- paraspinals     PATIENT EDUCATION:  Education details: Anatomy of condition, POC, HEP, exercise form/rationale Person educated: Patient Education method: Explanation, Demonstration, Tactile cues, Verbal cues, and Handouts Education comprehension: verbalized understanding, returned demonstration, verbal cues required, tactile cues required, and needs further education  HOME EXERCISE PROGRAM: [id number]   ASSESSMENT:  CLINICAL IMPRESSION: Pt with less spasm through thoracic musculature and tolerated exercises well today. Sat on swiss ball for core challenge but will use chair+pillow or sit on floor at home. Encouraged pt to continue with daily performance of strengthening exercises.    REHAB POTENTIAL: Fair    CLINICAL DECISION MAKING: Evolving/moderate complexity  EVALUATION COMPLEXITY: Moderate   GOALS: Goals reviewed with patient? Yes  SHORT TERM GOALS: Target date: 6/22  Pt will demo proper upright posture without VC Baseline: Goal status:achieved   LONG TERM GOALS: Target date: POC date  10 min pain free in a position Baseline: a few min before I need to roll my neck around Goal status: ongoing  2.  Able to utilize stretches and exercises while at work for pain  management Baseline:  Goal status: INITIAL  3.  Independent in long term periscapular strengthening program  Baseline:  Goal status: INITIAL    PLAN:  PT FREQUENCY: 1x/week  PT DURATION: 6 weeks  PLANNED INTERVENTIONS: Therapeutic exercises, Therapeutic activity, Neuromuscular re-education, Balance training, Patient/Family education, Self Care, Joint mobilization, Aquatic Therapy, Dry Needling, Electrical stimulation, Spinal mobilization, Cryotherapy, Moist heat, Taping, Traction, Ionotophoresis 4mg /ml Dexamethasone, Manual therapy, and Re-evaluation.  PLAN FOR NEXT SESSION: continue mobilization/STM and periscap strength   Sophya Vanblarcom C. Emer Onnen PT, DPT 11/27/22 3:54 PM

## 2022-11-28 ENCOUNTER — Other Ambulatory Visit: Payer: Self-pay

## 2022-11-28 ENCOUNTER — Encounter: Payer: Self-pay | Admitting: Obstetrics and Gynecology

## 2022-11-28 ENCOUNTER — Ambulatory Visit (INDEPENDENT_AMBULATORY_CARE_PROVIDER_SITE_OTHER): Payer: BC Managed Care – PPO | Admitting: Obstetrics and Gynecology

## 2022-11-28 VITALS — BP 113/68 | HR 88 | Ht 64.0 in | Wt 110.0 lb

## 2022-11-28 DIAGNOSIS — Z30430 Encounter for insertion of intrauterine contraceptive device: Secondary | ICD-10-CM | POA: Diagnosis not present

## 2022-11-28 LAB — POCT PREGNANCY, URINE: Preg Test, Ur: NEGATIVE

## 2022-11-28 MED ORDER — LEVONORGESTREL 20 MCG/DAY IU IUD
1.0000 | INTRAUTERINE_SYSTEM | Freq: Once | INTRAUTERINE | Status: AC
Start: 2022-11-28 — End: 2022-11-28
  Administered 2022-11-28: 1 via INTRAUTERINE

## 2022-11-28 NOTE — Progress Notes (Signed)
    GYNECOLOGY OFFICE PROCEDURE NOTE  Brianna [name] is a 27 y.o. G1P0010 here for IUD insertion. No GYN concerns.  Last pap smear was on 12/2020 and was normal.  IUD Insertion Procedure Note Procedure: IUD insertion with Mirena UPT: Negative GC/CT testing: [facility] and [facility].   Patient identified.  Risks, benefits and alternatives of procedure were discussed including irregular bleeding, cramping, infection, malpositioning or misplacement of the IUD outside the uterus which may require further procedure such as laparoscopy. Also discussed >99% contraception efficacy, increased risk of ectopic pregnancy with failure of method.   Emphasized that this did not protect against STIs, condoms recommended during all sexual encounters. Consent signed. Time out performed.   Speculum inserted and cervix visualized, prepped with 3 swabs of betadine.   Intracervical block done at 12/3/6/9 with 1% lidocaine for total of 8 cc. Grasped with a single tooth tenaculum. IUD then inserted without difficulty per manufacturer's instructions and strings cut to 3 cm below cervical os and all instruments removed. Pt tolerated well with minimal pain and bleeding.   Discussed concerning signs/symptoms and to call if heavy bleeding, severe abdominal pain, or fever in the following 3 weeks. Manufacturer pamphlet/patient information given. Reviewed timing of efficacy for contraception and to use an alternative form of birth control until that time.   Milas Hock, MD, FACOG Obstetrician & Gynecologist, [facility], [facility]

## 2022-11-29 ENCOUNTER — Encounter: Payer: Self-pay | Admitting: Obstetrics and Gynecology

## 2022-11-30 ENCOUNTER — Encounter: Payer: Self-pay | Admitting: General Practice

## 2022-12-14 ENCOUNTER — Ambulatory Visit (HOSPITAL_BASED_OUTPATIENT_CLINIC_OR_DEPARTMENT_OTHER): Payer: BC Managed Care – PPO | Admitting: Physical Therapy

## 2022-12-21 ENCOUNTER — Encounter (HOSPITAL_BASED_OUTPATIENT_CLINIC_OR_DEPARTMENT_OTHER): Payer: BC Managed Care – PPO | Admitting: Physical Therapy

## 2022-12-22 DIAGNOSIS — F411 Generalized anxiety disorder: Secondary | ICD-10-CM | POA: Diagnosis not present

## 2022-12-22 DIAGNOSIS — F4312 Post-traumatic stress disorder, chronic: Secondary | ICD-10-CM | POA: Diagnosis not present

## 2022-12-28 DIAGNOSIS — G894 Chronic pain syndrome: Secondary | ICD-10-CM | POA: Diagnosis not present

## 2023-01-01 ENCOUNTER — Encounter: Payer: Self-pay | Admitting: Physical Medicine and Rehabilitation

## 2023-01-01 ENCOUNTER — Telehealth: Payer: Self-pay | Admitting: *Deleted

## 2023-01-01 NOTE — Telephone Encounter (Signed)
Patient has new insurance effective 12/21/23. PA on botox for 01/09/23 no longer valid Patient will upload new insurance via [employer]. Explained this has to be re-authorized before visit.

## 2023-01-02 ENCOUNTER — Encounter: Payer: BC Managed Care – PPO | Admitting: Physical Medicine and Rehabilitation

## 2023-01-05 DIAGNOSIS — F4312 Post-traumatic stress disorder, chronic: Secondary | ICD-10-CM | POA: Diagnosis not present

## 2023-01-05 DIAGNOSIS — F411 Generalized anxiety disorder: Secondary | ICD-10-CM | POA: Diagnosis not present

## 2023-01-09 ENCOUNTER — Encounter
Payer: BC Managed Care – PPO | Attending: Physical Medicine and Rehabilitation | Admitting: Physical Medicine and Rehabilitation

## 2023-01-09 ENCOUNTER — Other Ambulatory Visit (HOSPITAL_BASED_OUTPATIENT_CLINIC_OR_DEPARTMENT_OTHER): Payer: Self-pay | Admitting: Orthopaedic Surgery

## 2023-01-09 ENCOUNTER — Encounter: Payer: Self-pay | Admitting: Physical Medicine and Rehabilitation

## 2023-01-09 ENCOUNTER — Telehealth (HOSPITAL_BASED_OUTPATIENT_CLINIC_OR_DEPARTMENT_OTHER): Payer: Self-pay | Admitting: Orthopaedic Surgery

## 2023-01-09 VITALS — BP 123/86 | HR 99 | Ht 64.0 in | Wt 115.0 lb

## 2023-01-09 DIAGNOSIS — S73191A Other sprain of right hip, initial encounter: Secondary | ICD-10-CM

## 2023-01-09 DIAGNOSIS — G43709 Chronic migraine without aura, not intractable, without status migrainosus: Secondary | ICD-10-CM

## 2023-01-09 DIAGNOSIS — M7918 Myalgia, other site: Secondary | ICD-10-CM

## 2023-01-09 NOTE — Progress Notes (Signed)
No auth for Botox- will reschedule

## 2023-01-09 NOTE — Telephone Encounter (Signed)
Referral placed to Dr. Magnus Ivan for 2nd opinion of hip per Dr. Steward Drone

## 2023-01-09 NOTE — Telephone Encounter (Signed)
Patient would like to know if she could be referred to another surgeon within [facility]? Best contact number [phone]

## 2023-01-09 NOTE — Patient Instructions (Addendum)
Apply emu oil, olive oil  Sherilyn Dacosta ketogenic diet

## 2023-01-10 ENCOUNTER — Encounter: Payer: Self-pay | Admitting: Physical Medicine and Rehabilitation

## 2023-01-11 DIAGNOSIS — F419 Anxiety disorder, unspecified: Secondary | ICD-10-CM | POA: Diagnosis not present

## 2023-01-11 DIAGNOSIS — F909 Attention-deficit hyperactivity disorder, unspecified type: Secondary | ICD-10-CM | POA: Diagnosis not present

## 2023-01-11 DIAGNOSIS — N3941 Urge incontinence: Secondary | ICD-10-CM | POA: Diagnosis not present

## 2023-01-11 DIAGNOSIS — M6289 Other specified disorders of muscle: Secondary | ICD-10-CM | POA: Diagnosis not present

## 2023-01-12 DIAGNOSIS — G894 Chronic pain syndrome: Secondary | ICD-10-CM | POA: Diagnosis not present

## 2023-01-19 DIAGNOSIS — F411 Generalized anxiety disorder: Secondary | ICD-10-CM | POA: Diagnosis not present

## 2023-01-19 DIAGNOSIS — F4312 Post-traumatic stress disorder, chronic: Secondary | ICD-10-CM | POA: Diagnosis not present

## 2023-01-20 ENCOUNTER — Other Ambulatory Visit: Payer: Self-pay | Admitting: Obstetrics & Gynecology

## 2023-01-20 DIAGNOSIS — N939 Abnormal uterine and vaginal bleeding, unspecified: Secondary | ICD-10-CM

## 2023-01-20 DIAGNOSIS — G8929 Other chronic pain: Secondary | ICD-10-CM

## 2023-01-25 ENCOUNTER — Ambulatory Visit: Payer: BC Managed Care – PPO | Admitting: Obstetrics and Gynecology

## 2023-01-25 ENCOUNTER — Encounter: Payer: Self-pay | Admitting: Obstetrics and Gynecology

## 2023-01-25 VITALS — BP 132/77 | HR 96 | Resp 16 | Ht 64.0 in | Wt 113.0 lb

## 2023-01-25 DIAGNOSIS — F411 Generalized anxiety disorder: Secondary | ICD-10-CM | POA: Diagnosis not present

## 2023-01-25 DIAGNOSIS — Z30431 Encounter for routine checking of intrauterine contraceptive device: Secondary | ICD-10-CM | POA: Diagnosis not present

## 2023-01-25 DIAGNOSIS — F4312 Post-traumatic stress disorder, chronic: Secondary | ICD-10-CM | POA: Diagnosis not present

## 2023-01-25 NOTE — Progress Notes (Signed)
   RETURN GYNECOLOGY VISIT  Subjective:  Brianna [name] is a 27 y.o. G1P0010 s/p IUD placement on 11/28/22 presenting for persistent bleeding and cramping.   Reports light bleeding daily since IUD insertion. Is also having cramping/bloating. Is frustrated that she has continued to have bleeding since May. Is following with [facility] for possible endometriosis, adenomyosis. Is planning to start PFPT through [facility] at the end of the month.   Objective:   Vitals:   01/25/23 1556  BP: 132/77  Pulse: 96  Resp: 16  Weight: 113 lb (51.3 kg)  Height: 5\' 4"  (1.626 m)   General:  Alert, oriented and cooperative. Patient is in no acute distress.  Skin: Skin is warm and dry. No rash noted.   Cardiovascular: Normal heart rate noted  Respiratory: Normal respiratory effort, no problems with respiration noted  Abdomen: Soft, non-tender, non-distended   Pelvic: NEFG. IUD strings palpable, external cervical os palpated and IUD stem not present - c/w appropriate placement. Minimal blood on exam. Offered repeat STI screening, but pt is UTD and declines at this time  Exam performed in the presence of a chaperone  Assessment and Plan:  Brianna [name] is a 27 y.o. presenting for IUD follow up  Reviewed that while bleeding is certainly bothersome, it is not dangerous and is a known side effect after IUD insertion. No e/o IUD malposition or expulsion on exam. Discussed option to add progestin to try to control bleeding, but that this may not be as efficacious as we hope - she agrees that the progestin is not necessary. Reassurance provided, discussed that we will see final effect on her bleeding at the 6 month mark.   Follow up prn  Future Appointments  Date Time Provider Department Center  02/01/2023 11:00 AM Raulkar, Drema Pry, MD [facility] CPR  02/01/2023  4:00 PM Kathryne Hitch, MD [facility] None  08/21/2023  8:00 AM Hershal Coria, PsyD [facility] CPR    Lennart Pall, MD

## 2023-01-26 ENCOUNTER — Telehealth: Payer: Self-pay

## 2023-01-26 DIAGNOSIS — K219 Gastro-esophageal reflux disease without esophagitis: Secondary | ICD-10-CM | POA: Diagnosis not present

## 2023-01-26 DIAGNOSIS — K3184 Gastroparesis: Secondary | ICD-10-CM | POA: Diagnosis not present

## 2023-01-26 DIAGNOSIS — K582 Mixed irritable bowel syndrome: Secondary | ICD-10-CM | POA: Diagnosis not present

## 2023-01-26 NOTE — Telephone Encounter (Signed)
Brianna [name] called in wanting to know if there is anyway you would be willing to see her during your lunch hour or anytime between 11:30 and 12:00 because she is not allowed to take anymore time off because she has little PTO left to use. She is wondering if there are any openings for that time frame. She said if you wanted to discuss something further with her you can reach out to her.

## 2023-01-29 DIAGNOSIS — M6289 Other specified disorders of muscle: Secondary | ICD-10-CM | POA: Diagnosis not present

## 2023-01-29 DIAGNOSIS — R350 Frequency of micturition: Secondary | ICD-10-CM | POA: Diagnosis not present

## 2023-01-29 DIAGNOSIS — R102 Pelvic and perineal pain: Secondary | ICD-10-CM | POA: Diagnosis not present

## 2023-01-29 DIAGNOSIS — R3915 Urgency of urination: Secondary | ICD-10-CM | POA: Diagnosis not present

## 2023-01-29 DIAGNOSIS — N3941 Urge incontinence: Secondary | ICD-10-CM | POA: Diagnosis not present

## 2023-02-01 ENCOUNTER — Ambulatory Visit: Payer: BC Managed Care – PPO | Admitting: Orthopaedic Surgery

## 2023-02-01 ENCOUNTER — Other Ambulatory Visit: Payer: Self-pay

## 2023-02-01 ENCOUNTER — Encounter: Payer: BC Managed Care – PPO | Admitting: Physical Medicine and Rehabilitation

## 2023-02-01 ENCOUNTER — Encounter: Payer: Self-pay | Admitting: Orthopaedic Surgery

## 2023-02-01 DIAGNOSIS — M25551 Pain in right hip: Secondary | ICD-10-CM

## 2023-02-01 NOTE — Progress Notes (Signed)
Brianna [name] is a very pleasant 27 year old female who was sent to me as another opinion as a relates to issues with her right hip.  She has a history of a right hip arthroscopy with a labral repair for a tear with pincer debridement back in October 2022.  She then underwent a second arthroscopy in December 2023 with a labral reconstruction using iliotibial band allograft.  She unfortunately his still dealt with significant pain as a relates to her right hip and it seems to be somewhat mechanical in nature as well with what she describes.  She does feel like popping sensation within the groin on her left side and it is almost an audible type of clicking as well that she was able to easily demonstrate in the office with me today.  She has an anatomic difference with her standing looking at her right hip versus her left hip in terms of some of the soft tissue but it does not appear to be significant muscle atrophy but obviously there is a difference.  She does have well-healed surgical incisions around this hip.  She has seen at least 2 different hip arthroscopy specialists other than the original treating surgeon.  A second opinion from an orthopedic specialist who performs hip arthroscopy in [facility] then referred her to a hip arthroscopist in [facility] with [facility].  She is also seen in Ortho orthopedic specialist over there at [facility] who recommended an acetabular osteotomy.  She is frustrated in terms of the amount of pain that she is experiencing and the detrimental effect this is having on her mobility and her quality of life.  I was able to review all of her notes that I could see within epic.  There are reports of the plain films recently done earlier this year of her pelvis and right hip as well as as a MRI of the right hip.  I do not have the actual studies but I have those reports.  I have the previous MRI studies and plain film studies that were done within our system for pelvis and  right hip.  She is thin.  She is not a diabetic.  She is tries to stay as active as she can.  We had a long and thorough discussion about other medical issues that I can empathize with as a relates to her heart as well as her GI system and some of the issues that those have caused.  She even had a recent tilt table test.  She does not seem to have any leg length difference.  There is some slight stiffness with range of motion of her right hip when you compare the right and left hips but there are no blocks to rotation but there is definitely pain in the groin with rotation around her right hip and there is certainly a clicking sensation that is palpable.  She does have pain over the lateral aspect of that hip as well.  The MRI report does not speak to any cartilage defects at all within the right femoral head or acetabulum or is there any mention of subchondral edema.  The does not mention any abnormalities within the psoas tendon.  On plain films I do not see any evidence of dysplastic hips.  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  We did have a long and thorough discussion about her situation.  Obviously she is having mechanical symptoms and this is causing significant pain and discomfort.  I am  uncertain as to the etiology of this in terms of whether or not the psoas tendon is the culprit.  I believe she is even had injections in this area which have not provided enough relief.  I asked if she could at some point see about getting the actual MRI study that was done at [facility] so I can review the films.  I will need to do some research on my own about what surgical options there may be for her.  She understands that my main operation is hip replacement surgery but I am not sure that this is the right surgery for her from a pain standpoint and given the fact that her cartilage is is intact but I would like to try to figure out what I can do to help her out clinically  and potentially surgically.  She understands that I need to look at this critically and thoroughly before coming up with a further treatment plan.  I will see her back in follow-up in about 2 weeks.

## 2023-02-08 ENCOUNTER — Encounter
Payer: BC Managed Care – PPO | Attending: Physical Medicine and Rehabilitation | Admitting: Physical Medicine and Rehabilitation

## 2023-02-08 VITALS — BP 116/81 | HR 98 | Ht 64.0 in | Wt 113.0 lb

## 2023-02-08 DIAGNOSIS — G43801 Other migraine, not intractable, with status migrainosus: Secondary | ICD-10-CM | POA: Diagnosis not present

## 2023-02-08 MED ORDER — ONABOTULINUMTOXINA 100 UNITS IJ SOLR
200.0000 [IU] | Freq: Once | INTRAMUSCULAR | Status: DC
Start: 2023-02-08 — End: 2023-02-08

## 2023-02-08 MED ORDER — ONABOTULINUMTOXINA 100 UNITS IJ SOLR
200.0000 [IU] | Freq: Once | INTRAMUSCULAR | Status: AC
Start: 2023-02-08 — End: 2023-02-08
  Administered 2023-02-08: 200 [IU] via INTRAMUSCULAR

## 2023-02-08 NOTE — Progress Notes (Signed)
Botox Injection for chronic migraine headaches ICD 10:   Dilution: 100 Units/ 2ml preservative free NS x2 Indication: refractory headaches (At least 15 days per month/headache lasting greater than 4 hours per day) incompletely responsive to other more conservative measures.  Informed consent was obtained after describing risks and benefits of the procedure with the patient. This includes bleeding, bruising, infection, excessive weakness, or medication side effects. A REMS form is on file and signed. Needle: 30g 1/2 inch needle   Number of units per muscle:  Right temporalis 20 units, 4 access points Left temporalis 20 units,  4 access points Right frontalis 10 units, 2 access points Left frontalis 10 units, 2 access points Procerus 5 units, 1 access point Right corrugator 5 units, 1 access point Left corrugator 5 units, 1 access point Right occipitalis 15 units, 3 access points Left occipitalis 15 units, 3 access points Right cervical paraspinal 10 units, 2 access points Left cervical paraspinals 10 units, 2 access points  Right trapezius 15 units, 3 access points Left trapezius 15 units, 3 access points   Remaining 45 units was discarded   All injections were done after  after negative drawback for blood. The patient tolerated the procedure well. Post procedure instructions were given. A followup appointment was made.

## 2023-02-08 NOTE — Addendum Note (Signed)
Addended by: Silas Sacramento T on: 02/08/2023 01:09 PM   Modules accepted: Orders

## 2023-02-09 DIAGNOSIS — F411 Generalized anxiety disorder: Secondary | ICD-10-CM | POA: Diagnosis not present

## 2023-02-09 DIAGNOSIS — F4312 Post-traumatic stress disorder, chronic: Secondary | ICD-10-CM | POA: Diagnosis not present

## 2023-02-14 DIAGNOSIS — G894 Chronic pain syndrome: Secondary | ICD-10-CM | POA: Diagnosis not present

## 2023-02-16 DIAGNOSIS — F4312 Post-traumatic stress disorder, chronic: Secondary | ICD-10-CM | POA: Diagnosis not present

## 2023-02-16 DIAGNOSIS — F411 Generalized anxiety disorder: Secondary | ICD-10-CM | POA: Diagnosis not present

## 2023-02-21 ENCOUNTER — Ambulatory Visit: Payer: BC Managed Care – PPO | Admitting: Orthopaedic Surgery

## 2023-02-23 DIAGNOSIS — F4312 Post-traumatic stress disorder, chronic: Secondary | ICD-10-CM | POA: Diagnosis not present

## 2023-02-23 DIAGNOSIS — F411 Generalized anxiety disorder: Secondary | ICD-10-CM | POA: Diagnosis not present

## 2023-02-28 ENCOUNTER — Encounter (HOSPITAL_BASED_OUTPATIENT_CLINIC_OR_DEPARTMENT_OTHER): Payer: Self-pay

## 2023-02-28 ENCOUNTER — Telehealth (HOSPITAL_BASED_OUTPATIENT_CLINIC_OR_DEPARTMENT_OTHER): Payer: Self-pay | Admitting: Orthopaedic Surgery

## 2023-02-28 ENCOUNTER — Other Ambulatory Visit (HOSPITAL_BASED_OUTPATIENT_CLINIC_OR_DEPARTMENT_OTHER): Payer: Self-pay | Admitting: Orthopaedic Surgery

## 2023-02-28 ENCOUNTER — Ambulatory Visit: Payer: BC Managed Care – PPO | Admitting: Orthopaedic Surgery

## 2023-02-28 VITALS — Ht 65.0 in | Wt 117.6 lb

## 2023-02-28 DIAGNOSIS — S73191A Other sprain of right hip, initial encounter: Secondary | ICD-10-CM

## 2023-02-28 DIAGNOSIS — M25551 Pain in right hip: Secondary | ICD-10-CM

## 2023-02-28 NOTE — Progress Notes (Signed)
Brianna [name] comes in today for further assessment as a relates to her chronic right hip issue.  On exam she definitely has internal snapping or at least some type of clunking of the femoral head in the acetabulum and this is in the groin area.  It is easily palpable and even audible.  She has some discomfort laterally over the IT band and the lateral aspect of her hip but it is the internal issues that are causing her the biggest concern.  She understands that I did speak to my partner Dr. Steward Drone who performed 2 different arthroscopic interventions on her right hip.  He is recommended a specialist at [facility] for her to see to get an opinion about her right hip issues.  I have let her know to send him a MyChart message so he can make the referral to the specific provider that he is referencing.

## 2023-02-28 NOTE — Telephone Encounter (Signed)
Patient seen Dr Magnus Ivan today and he told her to reach back out to Dr B.

## 2023-03-02 DIAGNOSIS — F4312 Post-traumatic stress disorder, chronic: Secondary | ICD-10-CM | POA: Diagnosis not present

## 2023-03-02 DIAGNOSIS — F411 Generalized anxiety disorder: Secondary | ICD-10-CM | POA: Diagnosis not present

## 2023-03-06 DIAGNOSIS — M1611 Unilateral primary osteoarthritis, right hip: Secondary | ICD-10-CM | POA: Diagnosis not present

## 2023-03-06 DIAGNOSIS — M25551 Pain in right hip: Secondary | ICD-10-CM | POA: Diagnosis not present

## 2023-03-06 DIAGNOSIS — M7061 Trochanteric bursitis, right hip: Secondary | ICD-10-CM | POA: Diagnosis not present

## 2023-03-16 DIAGNOSIS — F411 Generalized anxiety disorder: Secondary | ICD-10-CM | POA: Diagnosis not present

## 2023-03-16 DIAGNOSIS — F4312 Post-traumatic stress disorder, chronic: Secondary | ICD-10-CM | POA: Diagnosis not present

## 2023-03-19 ENCOUNTER — Telehealth: Payer: Self-pay | Admitting: Family Medicine

## 2023-03-19 DIAGNOSIS — M25851 Other specified joint disorders, right hip: Secondary | ICD-10-CM | POA: Diagnosis not present

## 2023-03-19 DIAGNOSIS — M25551 Pain in right hip: Secondary | ICD-10-CM | POA: Diagnosis not present

## 2023-03-19 DIAGNOSIS — M24151 Other articular cartilage disorders, right hip: Secondary | ICD-10-CM | POA: Diagnosis not present

## 2023-03-19 DIAGNOSIS — M24159 Other articular cartilage disorders, unspecified hip: Secondary | ICD-10-CM | POA: Diagnosis not present

## 2023-03-19 NOTE — Telephone Encounter (Signed)
I called patient regarding her MyChart message, informed patient for Dr. Para March I have no availability until 05/02/2023. Patient then asked if we can not just squeeze her in at lunch time or any where since she is okay with a virtual visit. Informed her next time Dr. Para March is here is on 04/05/2023 and that's when I can ask her. Patient expressed she will be calling the [place] location.

## 2023-03-20 DIAGNOSIS — F4312 Post-traumatic stress disorder, chronic: Secondary | ICD-10-CM | POA: Diagnosis not present

## 2023-03-20 DIAGNOSIS — F411 Generalized anxiety disorder: Secondary | ICD-10-CM | POA: Diagnosis not present

## 2023-03-21 ENCOUNTER — Encounter: Payer: Self-pay | Admitting: Orthopaedic Surgery

## 2023-03-21 ENCOUNTER — Encounter (HOSPITAL_BASED_OUTPATIENT_CLINIC_OR_DEPARTMENT_OTHER): Payer: Self-pay | Admitting: Orthopaedic Surgery

## 2023-03-21 DIAGNOSIS — G894 Chronic pain syndrome: Secondary | ICD-10-CM | POA: Diagnosis not present

## 2023-03-23 ENCOUNTER — Encounter: Payer: Self-pay | Admitting: Obstetrics and Gynecology

## 2023-03-27 DIAGNOSIS — F4312 Post-traumatic stress disorder, chronic: Secondary | ICD-10-CM | POA: Diagnosis not present

## 2023-03-27 DIAGNOSIS — F411 Generalized anxiety disorder: Secondary | ICD-10-CM | POA: Diagnosis not present

## 2023-03-29 ENCOUNTER — Telehealth: Payer: Self-pay

## 2023-03-29 NOTE — Telephone Encounter (Signed)
Brianna [name] would like to speak someone about her recent Botox treatment on 02/08/2023 by Dr. Carlis Abbott. She has a concern about her out of pocket expense.   Call back phone [phone]

## 2023-04-03 ENCOUNTER — Encounter: Payer: Self-pay | Admitting: Physical Medicine and Rehabilitation

## 2023-04-03 DIAGNOSIS — F4312 Post-traumatic stress disorder, chronic: Secondary | ICD-10-CM | POA: Diagnosis not present

## 2023-04-03 DIAGNOSIS — F411 Generalized anxiety disorder: Secondary | ICD-10-CM | POA: Diagnosis not present

## 2023-04-09 ENCOUNTER — Encounter: Payer: Self-pay | Admitting: Physical Medicine and Rehabilitation

## 2023-04-09 DIAGNOSIS — F4312 Post-traumatic stress disorder, chronic: Secondary | ICD-10-CM | POA: Diagnosis not present

## 2023-04-09 DIAGNOSIS — F411 Generalized anxiety disorder: Secondary | ICD-10-CM | POA: Diagnosis not present

## 2023-04-10 ENCOUNTER — Encounter (HOSPITAL_BASED_OUTPATIENT_CLINIC_OR_DEPARTMENT_OTHER): Payer: Self-pay | Admitting: Orthopaedic Surgery

## 2023-04-10 DIAGNOSIS — M76891 Other specified enthesopathies of right lower limb, excluding foot: Secondary | ICD-10-CM | POA: Diagnosis not present

## 2023-04-10 DIAGNOSIS — M25551 Pain in right hip: Secondary | ICD-10-CM | POA: Diagnosis not present

## 2023-04-17 DIAGNOSIS — F4312 Post-traumatic stress disorder, chronic: Secondary | ICD-10-CM | POA: Diagnosis not present

## 2023-04-17 DIAGNOSIS — F411 Generalized anxiety disorder: Secondary | ICD-10-CM | POA: Diagnosis not present

## 2023-04-20 DIAGNOSIS — F419 Anxiety disorder, unspecified: Secondary | ICD-10-CM | POA: Diagnosis not present

## 2023-04-20 DIAGNOSIS — Z114 Encounter for screening for human immunodeficiency virus [HIV]: Secondary | ICD-10-CM | POA: Diagnosis not present

## 2023-04-20 DIAGNOSIS — F32A Depression, unspecified: Secondary | ICD-10-CM | POA: Diagnosis not present

## 2023-04-20 DIAGNOSIS — Q7962 Hypermobile Ehlers-Danlos syndrome: Secondary | ICD-10-CM | POA: Diagnosis not present

## 2023-04-20 DIAGNOSIS — Z13220 Encounter for screening for lipoid disorders: Secondary | ICD-10-CM | POA: Diagnosis not present

## 2023-04-20 DIAGNOSIS — Z1159 Encounter for screening for other viral diseases: Secondary | ICD-10-CM | POA: Diagnosis not present

## 2023-04-20 DIAGNOSIS — D509 Iron deficiency anemia, unspecified: Secondary | ICD-10-CM | POA: Diagnosis not present

## 2023-04-20 DIAGNOSIS — Z131 Encounter for screening for diabetes mellitus: Secondary | ICD-10-CM | POA: Diagnosis not present

## 2023-04-20 DIAGNOSIS — F909 Attention-deficit hyperactivity disorder, unspecified type: Secondary | ICD-10-CM | POA: Diagnosis not present

## 2023-04-23 ENCOUNTER — Ambulatory Visit: Payer: BC Managed Care – PPO | Admitting: Obstetrics & Gynecology

## 2023-04-23 VITALS — BP 133/76 | HR 81 | Ht 65.0 in | Wt 115.0 lb

## 2023-04-23 DIAGNOSIS — N941 Unspecified dyspareunia: Secondary | ICD-10-CM | POA: Diagnosis not present

## 2023-04-23 DIAGNOSIS — N939 Abnormal uterine and vaginal bleeding, unspecified: Secondary | ICD-10-CM

## 2023-04-23 DIAGNOSIS — E282 Polycystic ovarian syndrome: Secondary | ICD-10-CM

## 2023-04-23 DIAGNOSIS — Z30431 Encounter for routine checking of intrauterine contraceptive device: Secondary | ICD-10-CM

## 2023-04-23 DIAGNOSIS — G8929 Other chronic pain: Secondary | ICD-10-CM

## 2023-04-23 NOTE — Progress Notes (Signed)
   Subjective:    [name], female    DOB: 25-Jan-1996, 27 y.o.   MRN: [id number]  HPI  27 yo female presents for discussion of bleeding and hormones.  Has nausea and vomiting when penetrative sex/hits cervix.  Acne a little worse with IUD.  Overall bleeding is much better.  Patient has overall concerns about hormones and IUDs however she understands that she has complex medical conditions that we are balancing the treatment side effects with the problem they are trying to solve.  Lakelee made this appointment thinking she wanted her IUD removed; however, after discussing today she believes that she would like to keep her IUD.  She is about to undergo another hip surgery for hip dysplasia.  She is getting her care now at [facility].  She also has seen [facility] who ordered pelvic physical therapy.  She may go back to see her later to look for endometriosis.  Patient is a ophthalmology tech for a private group and is worried about using up her paid time off before her FMLA starts.  She is worried about getting PT after her hip surgery if she has no PTO.  Review of Systems  Respiratory: Negative.    Gastrointestinal:  Positive for nausea.  Genitourinary:  Positive for dyspareunia, pelvic pain and vaginal bleeding.  Neurological:  Positive for light-headedness and headaches.  Psychiatric/Behavioral:  Positive for dysphoric mood and sleep disturbance.        Objective:   Physical Exam Vitals reviewed.  Constitutional:      General: She is not in acute distress.    Appearance: She is well-developed.  HENT:     Head: Normocephalic and atraumatic.  Eyes:     Conjunctiva/sclera: Conjunctivae normal.  Cardiovascular:     Rate and Rhythm: Normal rate.  Pulmonary:     Effort: Pulmonary effort is normal.  Skin:    General: Skin is warm and dry.     Comments: Mild perioral acne  Neurological:     Mental Status: She is alert and oriented to person, place, and  time.  Psychiatric:        Mood and Affect: Mood normal.    Vitals:   04/23/23 1548  BP: 133/76  Pulse: 81  Weight: 115 lb (52.2 kg)  Height: 5\' 5"  (1.651 m)       Assessment & Plan:  27 year old female with dysautonomia, menstrual irregularities with IUD in situ  After lengthy discussion today, patient elects to keep IUD. Avoid sexual positions that allow for deeper penetration.  Her [facility] gynecologist thinks that her cervix is on the right side patient understands to avoid deep penetration aiming at the side. Will follow-up as needed.

## 2023-04-24 DIAGNOSIS — F411 Generalized anxiety disorder: Secondary | ICD-10-CM | POA: Diagnosis not present

## 2023-04-24 DIAGNOSIS — F4312 Post-traumatic stress disorder, chronic: Secondary | ICD-10-CM | POA: Diagnosis not present

## 2023-04-28 DIAGNOSIS — M25551 Pain in right hip: Secondary | ICD-10-CM | POA: Diagnosis not present

## 2023-04-28 DIAGNOSIS — Q6589 Other specified congenital deformities of hip: Secondary | ICD-10-CM | POA: Diagnosis not present

## 2023-05-01 ENCOUNTER — Ambulatory Visit: Payer: BC Managed Care – PPO | Admitting: Obstetrics and Gynecology

## 2023-05-01 DIAGNOSIS — F4312 Post-traumatic stress disorder, chronic: Secondary | ICD-10-CM | POA: Diagnosis not present

## 2023-05-01 DIAGNOSIS — F411 Generalized anxiety disorder: Secondary | ICD-10-CM | POA: Diagnosis not present

## 2023-05-01 DIAGNOSIS — Z5181 Encounter for therapeutic drug level monitoring: Secondary | ICD-10-CM | POA: Diagnosis not present

## 2023-05-01 DIAGNOSIS — G894 Chronic pain syndrome: Secondary | ICD-10-CM | POA: Diagnosis not present

## 2023-05-01 DIAGNOSIS — Z79899 Other long term (current) drug therapy: Secondary | ICD-10-CM | POA: Diagnosis not present

## 2023-05-08 DIAGNOSIS — F411 Generalized anxiety disorder: Secondary | ICD-10-CM | POA: Diagnosis not present

## 2023-05-08 DIAGNOSIS — F4312 Post-traumatic stress disorder, chronic: Secondary | ICD-10-CM | POA: Diagnosis not present

## 2023-05-11 ENCOUNTER — Ambulatory Visit: Payer: BC Managed Care – PPO | Admitting: Physical Medicine and Rehabilitation

## 2023-05-14 DIAGNOSIS — H9313 Tinnitus, bilateral: Secondary | ICD-10-CM | POA: Diagnosis not present

## 2023-05-14 DIAGNOSIS — M2669 Other specified disorders of temporomandibular joint: Secondary | ICD-10-CM | POA: Insufficient documentation

## 2023-05-14 DIAGNOSIS — H938X3 Other specified disorders of ear, bilateral: Secondary | ICD-10-CM | POA: Diagnosis not present

## 2023-05-14 DIAGNOSIS — E611 Iron deficiency: Secondary | ICD-10-CM | POA: Diagnosis not present

## 2023-05-14 DIAGNOSIS — J3089 Other allergic rhinitis: Secondary | ICD-10-CM | POA: Diagnosis not present

## 2023-05-17 DIAGNOSIS — F411 Generalized anxiety disorder: Secondary | ICD-10-CM | POA: Diagnosis not present

## 2023-05-17 DIAGNOSIS — F4312 Post-traumatic stress disorder, chronic: Secondary | ICD-10-CM | POA: Diagnosis not present

## 2023-05-21 ENCOUNTER — Encounter: Payer: Self-pay | Admitting: Physical Medicine and Rehabilitation

## 2023-05-22 DIAGNOSIS — R7989 Other specified abnormal findings of blood chemistry: Secondary | ICD-10-CM | POA: Diagnosis not present

## 2023-05-29 DIAGNOSIS — F411 Generalized anxiety disorder: Secondary | ICD-10-CM | POA: Diagnosis not present

## 2023-05-29 DIAGNOSIS — F4312 Post-traumatic stress disorder, chronic: Secondary | ICD-10-CM | POA: Diagnosis not present

## 2023-05-30 DIAGNOSIS — M25551 Pain in right hip: Secondary | ICD-10-CM | POA: Diagnosis not present

## 2023-05-30 DIAGNOSIS — M21851 Other specified acquired deformities of right thigh: Secondary | ICD-10-CM | POA: Diagnosis not present

## 2023-05-30 DIAGNOSIS — Z9889 Other specified postprocedural states: Secondary | ICD-10-CM | POA: Diagnosis not present

## 2023-05-30 DIAGNOSIS — M25851 Other specified joint disorders, right hip: Secondary | ICD-10-CM | POA: Diagnosis not present

## 2023-06-05 DIAGNOSIS — F411 Generalized anxiety disorder: Secondary | ICD-10-CM | POA: Diagnosis not present

## 2023-06-05 DIAGNOSIS — F4312 Post-traumatic stress disorder, chronic: Secondary | ICD-10-CM | POA: Diagnosis not present

## 2023-06-06 DIAGNOSIS — M25851 Other specified joint disorders, right hip: Secondary | ICD-10-CM | POA: Insufficient documentation

## 2023-06-08 DIAGNOSIS — K2970 Gastritis, unspecified, without bleeding: Secondary | ICD-10-CM | POA: Diagnosis not present

## 2023-06-08 DIAGNOSIS — R109 Unspecified abdominal pain: Secondary | ICD-10-CM | POA: Diagnosis not present

## 2023-06-08 DIAGNOSIS — L988 Other specified disorders of the skin and subcutaneous tissue: Secondary | ICD-10-CM | POA: Diagnosis not present

## 2023-06-08 DIAGNOSIS — Z8379 Family history of other diseases of the digestive system: Secondary | ICD-10-CM | POA: Diagnosis not present

## 2023-06-08 DIAGNOSIS — Z9049 Acquired absence of other specified parts of digestive tract: Secondary | ICD-10-CM | POA: Diagnosis not present

## 2023-06-08 DIAGNOSIS — I872 Venous insufficiency (chronic) (peripheral): Secondary | ICD-10-CM | POA: Diagnosis not present

## 2023-06-08 DIAGNOSIS — F909 Attention-deficit hyperactivity disorder, unspecified type: Secondary | ICD-10-CM | POA: Diagnosis not present

## 2023-06-08 DIAGNOSIS — G43909 Migraine, unspecified, not intractable, without status migrainosus: Secondary | ICD-10-CM | POA: Diagnosis not present

## 2023-06-08 DIAGNOSIS — Z87891 Personal history of nicotine dependence: Secondary | ICD-10-CM | POA: Diagnosis not present

## 2023-06-08 DIAGNOSIS — F1290 Cannabis use, unspecified, uncomplicated: Secondary | ICD-10-CM | POA: Diagnosis not present

## 2023-06-08 DIAGNOSIS — K2960 Other gastritis without bleeding: Secondary | ICD-10-CM | POA: Diagnosis not present

## 2023-06-08 DIAGNOSIS — K219 Gastro-esophageal reflux disease without esophagitis: Secondary | ICD-10-CM | POA: Diagnosis not present

## 2023-06-08 DIAGNOSIS — Q7962 Hypermobile Ehlers-Danlos syndrome: Secondary | ICD-10-CM | POA: Diagnosis not present

## 2023-06-08 DIAGNOSIS — Z79899 Other long term (current) drug therapy: Secondary | ICD-10-CM | POA: Diagnosis not present

## 2023-06-08 DIAGNOSIS — J4520 Mild intermittent asthma, uncomplicated: Secondary | ICD-10-CM | POA: Diagnosis not present

## 2023-06-08 DIAGNOSIS — D8940 Mast cell activation, unspecified: Secondary | ICD-10-CM | POA: Diagnosis not present

## 2023-06-12 DIAGNOSIS — F4312 Post-traumatic stress disorder, chronic: Secondary | ICD-10-CM | POA: Diagnosis not present

## 2023-06-12 DIAGNOSIS — F411 Generalized anxiety disorder: Secondary | ICD-10-CM | POA: Diagnosis not present

## 2023-06-14 DIAGNOSIS — X58XXXD Exposure to other specified factors, subsequent encounter: Secondary | ICD-10-CM | POA: Diagnosis not present

## 2023-06-14 DIAGNOSIS — S73191D Other sprain of right hip, subsequent encounter: Secondary | ICD-10-CM | POA: Diagnosis not present

## 2023-06-14 DIAGNOSIS — Z9889 Other specified postprocedural states: Secondary | ICD-10-CM | POA: Diagnosis not present

## 2023-06-14 DIAGNOSIS — M25551 Pain in right hip: Secondary | ICD-10-CM | POA: Diagnosis not present

## 2023-06-15 DIAGNOSIS — M1631 Unilateral osteoarthritis resulting from hip dysplasia, right hip: Secondary | ICD-10-CM | POA: Diagnosis not present

## 2023-06-19 DIAGNOSIS — F4312 Post-traumatic stress disorder, chronic: Secondary | ICD-10-CM | POA: Diagnosis not present

## 2023-06-19 DIAGNOSIS — F411 Generalized anxiety disorder: Secondary | ICD-10-CM | POA: Diagnosis not present

## 2023-06-20 DIAGNOSIS — L503 Dermatographic urticaria: Secondary | ICD-10-CM | POA: Diagnosis not present

## 2023-06-20 DIAGNOSIS — L299 Pruritus, unspecified: Secondary | ICD-10-CM | POA: Diagnosis not present

## 2023-06-26 DIAGNOSIS — F411 Generalized anxiety disorder: Secondary | ICD-10-CM | POA: Diagnosis not present

## 2023-06-26 DIAGNOSIS — F4312 Post-traumatic stress disorder, chronic: Secondary | ICD-10-CM | POA: Diagnosis not present

## 2023-07-03 DIAGNOSIS — F411 Generalized anxiety disorder: Secondary | ICD-10-CM | POA: Diagnosis not present

## 2023-07-03 DIAGNOSIS — F4312 Post-traumatic stress disorder, chronic: Secondary | ICD-10-CM | POA: Diagnosis not present

## 2023-07-03 DIAGNOSIS — K9049 Malabsorption due to intolerance, not elsewhere classified: Secondary | ICD-10-CM | POA: Diagnosis not present

## 2023-07-03 DIAGNOSIS — R197 Diarrhea, unspecified: Secondary | ICD-10-CM | POA: Diagnosis not present

## 2023-07-10 DIAGNOSIS — F4312 Post-traumatic stress disorder, chronic: Secondary | ICD-10-CM | POA: Diagnosis not present

## 2023-07-10 DIAGNOSIS — F411 Generalized anxiety disorder: Secondary | ICD-10-CM | POA: Diagnosis not present

## 2023-07-16 DIAGNOSIS — K9049 Malabsorption due to intolerance, not elsewhere classified: Secondary | ICD-10-CM | POA: Diagnosis not present

## 2023-07-16 DIAGNOSIS — R197 Diarrhea, unspecified: Secondary | ICD-10-CM | POA: Diagnosis not present

## 2023-07-17 DIAGNOSIS — F4312 Post-traumatic stress disorder, chronic: Secondary | ICD-10-CM | POA: Diagnosis not present

## 2023-07-17 DIAGNOSIS — F411 Generalized anxiety disorder: Secondary | ICD-10-CM | POA: Diagnosis not present

## 2023-07-23 DIAGNOSIS — F332 Major depressive disorder, recurrent severe without psychotic features: Secondary | ICD-10-CM | POA: Diagnosis not present

## 2023-07-24 DIAGNOSIS — F411 Generalized anxiety disorder: Secondary | ICD-10-CM | POA: Diagnosis not present

## 2023-07-24 DIAGNOSIS — F4312 Post-traumatic stress disorder, chronic: Secondary | ICD-10-CM | POA: Diagnosis not present

## 2023-07-26 DIAGNOSIS — F4312 Post-traumatic stress disorder, chronic: Secondary | ICD-10-CM | POA: Diagnosis not present

## 2023-07-26 DIAGNOSIS — F411 Generalized anxiety disorder: Secondary | ICD-10-CM | POA: Diagnosis not present

## 2023-07-30 DIAGNOSIS — F332 Major depressive disorder, recurrent severe without psychotic features: Secondary | ICD-10-CM | POA: Diagnosis not present

## 2023-07-31 DIAGNOSIS — F411 Generalized anxiety disorder: Secondary | ICD-10-CM | POA: Diagnosis not present

## 2023-07-31 DIAGNOSIS — F4312 Post-traumatic stress disorder, chronic: Secondary | ICD-10-CM | POA: Diagnosis not present

## 2023-08-06 DIAGNOSIS — F332 Major depressive disorder, recurrent severe without psychotic features: Secondary | ICD-10-CM | POA: Diagnosis not present

## 2023-08-07 DIAGNOSIS — F4312 Post-traumatic stress disorder, chronic: Secondary | ICD-10-CM | POA: Diagnosis not present

## 2023-08-07 DIAGNOSIS — F411 Generalized anxiety disorder: Secondary | ICD-10-CM | POA: Diagnosis not present

## 2023-08-14 DIAGNOSIS — F411 Generalized anxiety disorder: Secondary | ICD-10-CM | POA: Diagnosis not present

## 2023-08-14 DIAGNOSIS — F4312 Post-traumatic stress disorder, chronic: Secondary | ICD-10-CM | POA: Diagnosis not present

## 2023-08-20 ENCOUNTER — Ambulatory Visit (INDEPENDENT_AMBULATORY_CARE_PROVIDER_SITE_OTHER): Payer: BC Managed Care – PPO | Admitting: Family Medicine

## 2023-08-20 ENCOUNTER — Encounter: Payer: Self-pay | Admitting: Family Medicine

## 2023-08-20 VITALS — BP 116/74 | HR 67 | Temp 98.1°F | Resp 16 | Ht 65.0 in | Wt 120.4 lb

## 2023-08-20 DIAGNOSIS — J45990 Exercise induced bronchospasm: Secondary | ICD-10-CM | POA: Diagnosis not present

## 2023-08-20 DIAGNOSIS — M1631 Unilateral osteoarthritis resulting from hip dysplasia, right hip: Secondary | ICD-10-CM | POA: Diagnosis not present

## 2023-08-20 DIAGNOSIS — R61 Generalized hyperhidrosis: Secondary | ICD-10-CM

## 2023-08-20 DIAGNOSIS — R002 Palpitations: Secondary | ICD-10-CM | POA: Diagnosis not present

## 2023-08-20 DIAGNOSIS — Z01818 Encounter for other preprocedural examination: Secondary | ICD-10-CM | POA: Diagnosis not present

## 2023-08-20 DIAGNOSIS — G909 Disorder of the autonomic nervous system, unspecified: Secondary | ICD-10-CM | POA: Diagnosis not present

## 2023-08-20 DIAGNOSIS — J4520 Mild intermittent asthma, uncomplicated: Secondary | ICD-10-CM | POA: Diagnosis not present

## 2023-08-20 DIAGNOSIS — Z8679 Personal history of other diseases of the circulatory system: Secondary | ICD-10-CM | POA: Diagnosis not present

## 2023-08-20 DIAGNOSIS — K582 Mixed irritable bowel syndrome: Secondary | ICD-10-CM | POA: Diagnosis not present

## 2023-08-20 DIAGNOSIS — Q796 Ehlers-Danlos syndrome, unspecified: Secondary | ICD-10-CM | POA: Diagnosis not present

## 2023-08-20 DIAGNOSIS — I872 Venous insufficiency (chronic) (peripheral): Secondary | ICD-10-CM | POA: Diagnosis not present

## 2023-08-20 DIAGNOSIS — K219 Gastro-esophageal reflux disease without esophagitis: Secondary | ICD-10-CM | POA: Diagnosis not present

## 2023-08-20 DIAGNOSIS — F3342 Major depressive disorder, recurrent, in full remission: Secondary | ICD-10-CM | POA: Diagnosis not present

## 2023-08-20 DIAGNOSIS — F419 Anxiety disorder, unspecified: Secondary | ICD-10-CM | POA: Diagnosis not present

## 2023-08-20 DIAGNOSIS — E282 Polycystic ovarian syndrome: Secondary | ICD-10-CM

## 2023-08-20 DIAGNOSIS — J302 Other seasonal allergic rhinitis: Secondary | ICD-10-CM

## 2023-08-20 DIAGNOSIS — Z87891 Personal history of nicotine dependence: Secondary | ICD-10-CM | POA: Diagnosis not present

## 2023-08-20 DIAGNOSIS — Q7962 Hypermobile Ehlers-Danlos syndrome: Secondary | ICD-10-CM

## 2023-08-20 NOTE — Progress Notes (Signed)
 New Patient Office Visit  Subjective:  Patient ID: Daily Doe, female    DOB: 1996-05-08  Age: 28 y.o. MRN: [id number]  CC:  Chief Complaint  Patient presents with   Establish Care    Initial visit to establish care with new pcp Referral to allergist    HPI Ch[place]i Goh presents for new pt To establish.  Allergist-?mast cell disorder-seen [facility] and [facility].   Needs someone who does skin, GI, foods, rashes.  Pots possibly Night sweats for 5 years.  Most nights.   Dr. Sandrea Hammond Whalen-allergist passed. Dr. Corky Crafts needs labs first.  Discussed the use of AI scribe software for clinical note transcription with the patient, who gave verbal consent to proceed.  History of Present Illness Cortney Beissel is a 28 year old female who presents for establishment of care Has upcoming right hip total joint replacement.  She is seeking to establish care with a primary care provider in preparation for a right hip total joint replacement scheduled in two weeks. She has not had a consistent primary care provider and is looking for someone knowledgeable in managing her multiple health conditions.  She has a history of sinus infections, ADHD, allergies, anemia, anxiety, depression, exercise-induced asthma, migraines, PTSD, and dysautonomia. She has not used an inhaler for asthma in over ten years and does not exercise due to pain and dizziness, suspecting POTS, which has not been formally diagnosed. She also mentions fibromyalgia and gastroparesis, though she does not strongly identify with the fibromyalgia diagnosis.  She experiences significant night sweats almost nightly for the past five years, which she describes as 'drenching' and leading to feeling cold upon waking. Hormone testing returned normal results, and clonidine was tried without relief. She has not checked her temperature during these episodes.  She suspects mast cell activation syndrome (MCAS) due to skin and  gastrointestinal symptoms, including itchiness, rashes, and dermatographism. She has been unable to find a provider to evaluate her for this condition and has been advised of a specialist in [place] but has not completed the necessary workup to see the specialist.  She has a history of palpitations and heart racing, particularly when standing or walking, and has been referred to cardiology for further evaluation. Orthostatic testing showed slight increases in blood pressure and heart rate.  She has irregular bowel movements, which she attributes to gastroparesis and bile acid malabsorption following gallbladder removal. Symptoms vary based on dietary intake.  She has a history of positive ANA testing but has not had a recent autoimmune workup. and was told she might have adenomyosis.     Current Outpatient Medications:    clonazePAM (KLONOPIN) 1 MG tablet, Take 1 mg by mouth daily as needed., Disp: , Rfl:    colestipol (COLESTID) 1 g tablet, Take by mouth 2 (two) times daily., Disp: , Rfl:    levocetirizine (XYZAL) 5 MG tablet, Take 5 mg by mouth 2 (two) times daily., Disp: , Rfl:    levonorgestrel (MIRENA) 20 MCG/DAY IUD, 1 each by Intrauterine route once., Disp: , Rfl:    loratadine (CLARITIN) 10 MG tablet, Take 10 mg by mouth at bedtime., Disp: , Rfl:    Olopatadine HCl 0.6 % SOLN, Place into the nose., Disp: , Rfl:    ondansetron (ZOFRAN ODT) 4 MG disintegrating tablet, Take 1 tablet (4 mg total) by mouth every 8 (eight) hours as needed for nausea or vomiting., Disp: 20 tablet, Rfl: 5   prochlorperazine (COMPAZINE) 10 MG tablet, , Disp: , Rfl:  promethazine (PHENERGAN) 25 MG tablet, Take by mouth., Disp: , Rfl:    Psyllium (GERI-MUCIL) 25 % POWD, Take by mouth daily as needed. 2 tsp, Disp: , Rfl:    traMADol (ULTRAM) 50 MG tablet, Take 50 mg by mouth every 6 (six) hours as needed., Disp: , Rfl:    triamcinolone cream (KENALOG) 0.1 %, Apply 1 Application topically daily as needed  (Eczma)., Disp: , Rfl:    Vilazodone HCl (VIIBRYD) 40 MG TABS, Take 40 mg by mouth daily., Disp: , Rfl:    Vitamin D-Vitamin K (VITAMIN K2-VITAMIN D3 PO), Take 125 mcg by mouth daily., Disp: , Rfl:   Past Medical History:  Diagnosis Date   Acute recurrent pansinusitis 10/21/2019   ADHD (attention deficit hyperactivity disorder)    inattentive   Allergy    Anemia    had iron infusions in Fall of 2023   Anxiety    Arthritis    Joints, hips   Asthma    EIA   Bronchitis    Cat allergy due to both airborne and skin contact 05/01/2016   Chronic sore throat 05/01/2016   COVID 2020   Depression    Fibromyalgia    pt denies   Gastroparesis    GERD (gastroesophageal reflux disease)    History of hypotension    taken midodrine in the past- Spring of 2022   Hypermobile Ehlers-Danlos syndrome    Irregular heart rate    Keratosis pilaris    Migraine    w and w/o auras   PCOS (polycystic ovarian syndrome) 10/26/2022   PONV (postoperative nausea and vomiting)    "I can get really cold"   PTSD (post-traumatic stress disorder)    Seasonal allergic rhinitis 03/11/2014    Past Surgical History:  Procedure Laterality Date   CHOLECYSTECTOMY  05/09/2021   ESOPHAGOGASTRODUODENOSCOPY ENDOSCOPY     03/2020   HIP ARTHROSCOPY Right 04/25/2022   Procedure: RIGHT HIP ARTHROSCOPY WITH LABRAL RECONSTRUCTION, ILIOTIBIAL BAND ALLOGRAFT, PSOAS RELEASE;  Surgeon: [name], MD;  Location: [facility] OR;  Service: Orthopedics;  Laterality: Right;   JOINT REPLACEMENT     Upcoming THR right hip 09/03/23   LABRAL REPAIR Right 03/02/2021   Procedure: RIGHT HIP ARTHROSCOPY WITH LABRAL REPAIR AND PINCE DEBRIDEMENT;  Surgeon: [name], MD;  Location: [facility] OR;  Service: Orthopedics;  Laterality: Right;   SMART PILL PROCEDURE     12/2020   TYMPANOSTOMY TUBE PLACEMENT Bilateral    WISDOM TOOTH EXTRACTION      Family History  Problem Relation Age of Onset   Diabetes Mother    Polycystic ovary syndrome  Mother    Anxiety disorder Mother    Arthritis Mother    Kidney disease Mother    Obesity Mother    GER disease Father    Arthritis Father    Hypertension Father    ADD / ADHD Brother    Obesity Brother    Heart disease Paternal Uncle    COPD Maternal Grandmother    Cancer Paternal Aunt     Social History   Socioeconomic History   Marital status: Single    Spouse name: Not on file   Number of children: Not on file   Years of education: Not on file   Highest education level: Bachelor's degree (e.g., BA, AB, BS)  Occupational History   Not on file  Tobacco Use   Smoking status: Former    Current packs/day: 0.00    Types: Cigarettes, E-cigarettes    Quit  date: 2021    Years since quitting: 4.2   Smokeless tobacco: Never   Tobacco comments:    Have not used in 4 years  Vaping Use   Vaping status: Former   Substances: Financial trader  Substance and Sexual Activity   Alcohol use: Not Currently    Comment: occ   Drug use: Not Currently    Frequency: 7.0 times per week    Types: Marijuana    Comment: daily   Sexual activity: Not Currently    Birth control/protection: Condom, I.U.D.  Other Topics Concern   Not on file  Social History Narrative   Right handed   Social Drivers of Health   Financial Resource Strain: High Risk (08/17/2023)   Overall Financial Resource Strain (CARDIA)    Difficulty of Paying Living Expenses: Very hard  Food Insecurity: Food Insecurity Present (08/17/2023)   Hunger Vital Sign    Worried About Running Out of Food in the Last Year: Often true    Ran Out of Food in the Last Year: Sometimes true  Transportation Needs: No Transportation Needs (08/17/2023)   PRAPARE - Administrator, Civil Service (Medical): No    Lack of Transportation (Non-Medical): No  Physical Activity: Insufficiently Active (08/17/2023)   Exercise Vital Sign    Days of Exercise per Week: 2 days    Minutes of Exercise per Session: 30 min  Stress:  Stress Concern Present (08/17/2023)   [employer] of Occupational Health - Occupational Stress Questionnaire    Feeling of Stress : Very much  Social Connections: Unknown (08/17/2023)   Social Connection and Isolation Panel [NHANES]    Frequency of Communication with Friends and Family: More than three times a week    Frequency of Social Gatherings with Friends and Family: Twice a week    Attends Religious Services: Patient declined    Database administrator or Organizations: No    Attends Engineer, structural: Not on file    Marital Status: Never married  Intimate Partner Violence: Unknown (08/25/2021)   Received from [employer], [facility]   HITS    Physically Hurt: Not on file    Insult or Talk Down To: Not on file    Threaten Physical Harm: Not on file    Scream or Curse: Not on file    ROS  ROS: Eyes: no blurry vision, double vision Resp: no cough, wheeze,SOB CV: no CP, palpitations, LE edema,  can walk w/o cp sob.  Ortho limits her GI: chronic GU: no dysuria, urgency, frequency, hematuria MSK: chronic Neuro: chronic Psych: adhd,moods stable.  Managed by psych.  Also ptsd  Objective:   Today's Vitals: BP 116/74   Pulse 67   Temp 98.1 F (36.7 C) (Temporal)   Resp 16   Ht 5\' 5"  (1.651 m)   Wt 120 lb 6 oz (54.6 kg)   SpO2 98%   BMI 20.03 kg/m   Physical Exam  Gen: WDWN NAD HEENT: NCAT, conjunctiva not injected, sclera nonicteric NECK:  supple, no thyromegaly, no nodes, no carotid bruits CARDIAC: RRR, S1S2+, no murmur. DP 2+B LUNGS: CTAB. No wheezes ABDOMEN:  BS+, soft, sl tender diffusely, No HSM, no masses EXT:  no edema MSK: no gross abnormalities.  NEURO: A&O x3.  CN II-XII intact.  PSYCH: normal mood. Good eye contact   Reviewed records  neg tryptase.  05/14/23 note mentions some possible places that deal w/mast cell activation syndrome.  Annual 04/20/23-labs unremarkable  Assessment &  Plan:  Chronic night sweats  Autonomic  dysfunction  Anxiety  Exercise-induced asthma  Hypermobile Ehlers-Danlos syndrome  PCOS (polycystic ovarian syndrome)  Seasonal allergies   Assessment and Plan Assessment & Plan Establishment of Care   Myranda is establishing care with a new primary care provider in preparation for major surgery in two weeks. She has a complex medical history and seeks a knowledgeable provider for collaborative care. Establish care with a primary care provider.  Night Sweats   Revonda Standard experiences night sweats almost nightly for five years, waking up drenched and cold without feeling hot. Previous hormone checks were normal, and clonidine was ineffective. Consider a trial of prazosin, used for PTSD and nightmares, as a potential treatment. Try a high protein snack before bed for two nights to rule out hypoglycemia as a contributing factor. Check for fever.  When returns will consider other causes and w/u.  Her main focus is getting thru hip surgery  Mast Cell Activation Syndrome (MCAS) Evaluation   Laityn suspects MCAS due to skin and GI issues, including dermatographism and food-related flares. She has been unable to find a provider for MCAS evaluation. Research potential specialists and consider referral to Dr. Livingston Diones, who requires specific blood work for evaluation.  Dysautonomia   Mee has dysautonomia with symptoms such as dizziness and heart palpitations. She has been referred to cardiology for further evaluation, with an appointment scheduled for the end of May. Previous evaluations included orthostatic testing, showing increased heart rate and blood pressure upon standing. Follow up with the cardiology appointment in May. Had a tilt table test in past-saw mention of it in one of the notes reviewed  Gastroparesis   Loeta has gastroparesis, affecting her dietary habits and contributing to GI symptoms. She avoids eating close to bedtime to manage symptoms.  Polycystic Ovary Syndrome  (PCOS)   Sharen has PCOS, contributing to irregular menstrual cycles. She was placed on progesterone and later an IUD to manage symptoms.  EIA-not needing inhaler as not really exercising Chronic allergies-on meds.  A lot of problems  EDS-seeing ortho  General Health Maintenance   Keeanna is a non-smoker, does not vape, and consumes minimal alcohol. 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 She has anemia, anxiety, depression, and PTSD, for which she receives regular psychiatric care.  Follow-up   Mical is scheduled for a total joint replacement of the right hip in two weeks. The importance of follow-up after surgery was discussed. Schedule a follow-up appointment in six months for an annual physical. Encourage her to contact the office if no follow-up on referrals or labs is received.   Follow-up: Return in about 6 months (around 02/19/2024) for annual physical.   Angelena Sole, MD

## 2023-08-20 NOTE — Patient Instructions (Signed)
Welcome to [facility] at [facility]! It was a pleasure meeting you today.  As discussed, Please schedule a 3 month follow up visit today.  PLEASE NOTE:  If you had any LAB tests please let us know if you have not heard back within a few days. You may see your results on MyChart before we have a chance to review them but we will give you a call once they are reviewed by us. If we ordered any REFERRALS today, please let us know if you have not heard from their office within the next week.  Let us know through MyChart if you are needing REFILLS, or have your pharmacy send us the request. You can also use MyChart to communicate with me or any office staff.  Please try these tips to maintain a healthy lifestyle:  Eat most of your calories during the day when you are active. Eliminate processed foods including packaged sweets (pies, cakes, cookies), reduce intake of potatoes, white bread, white pasta, and white rice. Look for whole grain options, oat flour or almond flour.  Each meal should contain half fruits/vegetables, one quarter protein, and one quarter carbs (no bigger than a computer mouse).  Cut down on sweet beverages. This includes juice, soda, and sweet tea. Also watch fruit intake, though this is a healthier sweet option, it still contains natural sugar! Limit to 3 servings daily.  Drink at least 1 glass of water with each meal and aim for at least 8 glasses per day  Exercise at least 150 minutes every week.   

## 2023-08-21 ENCOUNTER — Encounter: Payer: BC Managed Care – PPO | Attending: Psychology | Admitting: Psychology

## 2023-08-21 ENCOUNTER — Encounter: Payer: Self-pay | Admitting: Psychology

## 2023-08-21 DIAGNOSIS — F332 Major depressive disorder, recurrent severe without psychotic features: Secondary | ICD-10-CM | POA: Insufficient documentation

## 2023-08-21 DIAGNOSIS — F4312 Post-traumatic stress disorder, chronic: Secondary | ICD-10-CM | POA: Insufficient documentation

## 2023-08-21 DIAGNOSIS — G4700 Insomnia, unspecified: Secondary | ICD-10-CM | POA: Insufficient documentation

## 2023-08-21 DIAGNOSIS — F411 Generalized anxiety disorder: Secondary | ICD-10-CM | POA: Diagnosis not present

## 2023-08-21 DIAGNOSIS — G43709 Chronic migraine without aura, not intractable, without status migrainosus: Secondary | ICD-10-CM | POA: Insufficient documentation

## 2023-08-21 DIAGNOSIS — R61 Generalized hyperhidrosis: Secondary | ICD-10-CM | POA: Insufficient documentation

## 2023-08-21 DIAGNOSIS — F419 Anxiety disorder, unspecified: Secondary | ICD-10-CM | POA: Insufficient documentation

## 2023-08-22 ENCOUNTER — Encounter: Payer: Self-pay | Admitting: Psychology

## 2023-08-22 DIAGNOSIS — F332 Major depressive disorder, recurrent severe without psychotic features: Secondary | ICD-10-CM | POA: Diagnosis not present
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
## 2023-08-27 ENCOUNTER — Encounter: Payer: Self-pay | Admitting: Family Medicine

## 2023-08-27 DIAGNOSIS — F332 Major depressive disorder, recurrent severe without psychotic features: Secondary | ICD-10-CM | POA: Diagnosis not present

## 2023-08-28 ENCOUNTER — Ambulatory Visit

## 2023-08-28 ENCOUNTER — Other Ambulatory Visit: Payer: Self-pay | Admitting: *Deleted

## 2023-08-28 DIAGNOSIS — L739 Follicular disorder, unspecified: Secondary | ICD-10-CM | POA: Diagnosis not present

## 2023-08-28 DIAGNOSIS — F4312 Post-traumatic stress disorder, chronic: Secondary | ICD-10-CM | POA: Diagnosis not present

## 2023-08-28 DIAGNOSIS — F411 Generalized anxiety disorder: Secondary | ICD-10-CM | POA: Diagnosis not present

## 2023-08-28 MED ORDER — PROMETHAZINE HCL 25 MG PO TABS: 25.0000 mg | ORAL_TABLET | Freq: Four times a day (QID) | ORAL | 1 refills | Status: AC | PRN

## 2023-09-01 DIAGNOSIS — F332 Major depressive disorder, recurrent severe without psychotic features: Secondary | ICD-10-CM | POA: Diagnosis not present

## 2023-09-03 DIAGNOSIS — M1611 Unilateral primary osteoarthritis, right hip: Secondary | ICD-10-CM | POA: Diagnosis not present

## 2023-09-04 ENCOUNTER — Ambulatory Visit (HOSPITAL_COMMUNITY)

## 2023-09-04 DIAGNOSIS — F4312 Post-traumatic stress disorder, chronic: Secondary | ICD-10-CM | POA: Diagnosis not present

## 2023-09-04 DIAGNOSIS — F411 Generalized anxiety disorder: Secondary | ICD-10-CM | POA: Diagnosis not present

## 2023-09-04 DIAGNOSIS — L738 Other specified follicular disorders: Secondary | ICD-10-CM | POA: Diagnosis not present

## 2023-09-05 ENCOUNTER — Ambulatory Visit: Payer: BC Managed Care – PPO | Admitting: Physical Therapy

## 2023-09-05 ENCOUNTER — Ambulatory Visit: Admitting: Family

## 2023-09-06 DIAGNOSIS — G43719 Chronic migraine without aura, intractable, without status migrainosus: Secondary | ICD-10-CM | POA: Diagnosis not present

## 2023-09-10 DIAGNOSIS — F332 Major depressive disorder, recurrent severe without psychotic features: Secondary | ICD-10-CM | POA: Diagnosis not present

## 2023-09-11 ENCOUNTER — Encounter: Admitting: Physical Therapy

## 2023-09-11 DIAGNOSIS — F4312 Post-traumatic stress disorder, chronic: Secondary | ICD-10-CM | POA: Diagnosis not present

## 2023-09-11 DIAGNOSIS — F411 Generalized anxiety disorder: Secondary | ICD-10-CM | POA: Diagnosis not present

## 2023-09-13 ENCOUNTER — Encounter: Admitting: Physical Therapy

## 2023-09-17 DIAGNOSIS — F332 Major depressive disorder, recurrent severe without psychotic features: Secondary | ICD-10-CM | POA: Diagnosis not present

## 2023-09-18 ENCOUNTER — Encounter: Admitting: Physical Therapy

## 2023-09-18 DIAGNOSIS — F4312 Post-traumatic stress disorder, chronic: Secondary | ICD-10-CM | POA: Diagnosis not present

## 2023-09-18 DIAGNOSIS — F411 Generalized anxiety disorder: Secondary | ICD-10-CM | POA: Diagnosis not present

## 2023-09-19 DIAGNOSIS — F332 Major depressive disorder, recurrent severe without psychotic features: Secondary | ICD-10-CM | POA: Diagnosis not present

## 2023-09-20 ENCOUNTER — Encounter: Admitting: Physical Therapy

## 2023-09-21 DIAGNOSIS — F332 Major depressive disorder, recurrent severe without psychotic features: Secondary | ICD-10-CM | POA: Diagnosis not present

## 2023-09-24 DIAGNOSIS — F332 Major depressive disorder, recurrent severe without psychotic features: Secondary | ICD-10-CM | POA: Diagnosis not present

## 2023-09-25 ENCOUNTER — Encounter: Admitting: Physical Therapy

## 2023-09-25 DIAGNOSIS — F411 Generalized anxiety disorder: Secondary | ICD-10-CM | POA: Diagnosis not present

## 2023-09-25 DIAGNOSIS — F4312 Post-traumatic stress disorder, chronic: Secondary | ICD-10-CM | POA: Diagnosis not present

## 2023-09-27 ENCOUNTER — Encounter: Admitting: Physical Therapy

## 2023-10-01 DIAGNOSIS — F332 Major depressive disorder, recurrent severe without psychotic features: Secondary | ICD-10-CM | POA: Diagnosis not present

## 2023-10-02 DIAGNOSIS — J45990 Exercise induced bronchospasm: Secondary | ICD-10-CM | POA: Diagnosis not present

## 2023-10-02 DIAGNOSIS — F3342 Major depressive disorder, recurrent, in full remission: Secondary | ICD-10-CM | POA: Diagnosis not present

## 2023-10-02 DIAGNOSIS — K582 Mixed irritable bowel syndrome: Secondary | ICD-10-CM | POA: Diagnosis not present

## 2023-10-02 DIAGNOSIS — R002 Palpitations: Secondary | ICD-10-CM | POA: Diagnosis not present

## 2023-10-02 DIAGNOSIS — Z01818 Encounter for other preprocedural examination: Secondary | ICD-10-CM | POA: Diagnosis not present

## 2023-10-02 DIAGNOSIS — E282 Polycystic ovarian syndrome: Secondary | ICD-10-CM | POA: Diagnosis not present

## 2023-10-02 DIAGNOSIS — K219 Gastro-esophageal reflux disease without esophagitis: Secondary | ICD-10-CM | POA: Diagnosis not present

## 2023-10-02 DIAGNOSIS — F419 Anxiety disorder, unspecified: Secondary | ICD-10-CM | POA: Diagnosis not present

## 2023-10-02 DIAGNOSIS — Z8679 Personal history of other diseases of the circulatory system: Secondary | ICD-10-CM | POA: Diagnosis not present

## 2023-10-02 DIAGNOSIS — Q796 Ehlers-Danlos syndrome, unspecified: Secondary | ICD-10-CM | POA: Diagnosis not present

## 2023-10-02 DIAGNOSIS — F4312 Post-traumatic stress disorder, chronic: Secondary | ICD-10-CM | POA: Diagnosis not present

## 2023-10-02 DIAGNOSIS — G43919 Migraine, unspecified, intractable, without status migrainosus: Secondary | ICD-10-CM | POA: Diagnosis not present

## 2023-10-02 DIAGNOSIS — F411 Generalized anxiety disorder: Secondary | ICD-10-CM | POA: Diagnosis not present

## 2023-10-02 DIAGNOSIS — G909 Disorder of the autonomic nervous system, unspecified: Secondary | ICD-10-CM | POA: Diagnosis not present

## 2023-10-02 DIAGNOSIS — M1611 Unilateral primary osteoarthritis, right hip: Secondary | ICD-10-CM | POA: Diagnosis not present

## 2023-10-06 ENCOUNTER — Ambulatory Visit

## 2023-10-07 ENCOUNTER — Ambulatory Visit
Admission: RE | Admit: 2023-10-07 | Discharge: 2023-10-07 | Disposition: A | Discharge status: Home / Self Care | Source: Ambulatory Visit | Attending: Family Medicine | Admitting: Family Medicine

## 2023-10-07 VITALS — BP 125/81 | HR 76 | Temp 98.1°F | Resp 18

## 2023-10-07 DIAGNOSIS — B370 Candidal stomatitis: Secondary | ICD-10-CM

## 2023-10-07 MED ORDER — NYSTATIN 100000 UNIT/ML MT SUSP: 500000.0000 [IU] | Freq: Four times a day (QID) | OROMUCOSAL | 0 refills | Status: DC

## 2023-10-07 NOTE — ED Provider Notes (Signed)
 [facility]    CSN: [id number] Arrival date & time: 10/07/23  1408      History   Chief Complaint Chief Complaint  Patient presents with   Sore Throat    Possible thrush on tongue maybe from being on antibiotics for 3 weeks, sore throat and congestion too - Entered by patient    HPI Rosaria Kubin is a 28 y.o. female presents for possible thrush.  Patient reports she was on doxycycline x 3 weeks for a skin infection.  Reports she completed the antibiotic 1 week ago.  States since she completed it she is concerned she may have developed psoriasis as she has a white film in her mouth.  Denies any mouth pain or pain with eating or swallowing.  No sore throat.  No fevers.  No history of thrush.  She is concerned because she has a hip replacement surgery coming up in June.  No OTC treatments have been used.  No other concerns at this time.   Sore Throat    Past Medical History:  Diagnosis Date   Acute recurrent pansinusitis 10/21/2019   ADHD (attention deficit hyperactivity disorder)    inattentive   Allergy    Anemia    had iron infusions in Fall of 2023   Anxiety    Arthritis    Joints, hips   Asthma    EIA   Bronchitis    Cat allergy due to both airborne and skin contact 05/01/2016   Chronic sore throat 05/01/2016   COVID 2020   Depression    Fibromyalgia    pt denies   Gastroparesis    GERD (gastroesophageal reflux disease)    History of hypotension    taken midodrine  in the past- Spring of 2022   Hypermobile Ehlers-Danlos syndrome    Irregular heart rate    Keratosis pilaris    Migraine    w and w/o auras   PCOS (polycystic ovarian syndrome) 10/26/2022   PONV (postoperative nausea and vomiting)    "I can get really cold"   PTSD (post-traumatic stress disorder)    Seasonal allergic rhinitis 03/11/2014    Patient Active Problem List   Diagnosis Date Noted   Chronic night sweats 08/21/2023   PCOS (polycystic ovarian syndrome) 10/26/2022    Cervical adenopathy 06/10/2021   Chronic pruritus 06/10/2021   Exercise-induced asthma 06/09/2021   Chronic venous insufficiency 06/03/2021   Orthostatic lightheadedness 06/03/2021   Purple toe syndrome of both feet (HCC) 06/03/2021   Autonomic dysfunction 06/03/2021   Tear of right acetabular labrum 04/13/2021   Migraine headache 03/01/2021   Headache 11/30/2020   Positive ANA (antinuclear antibody) 11/15/2020   Fibromyalgia 11/02/2020   Hair loss 11/02/2020   Muscle spasticity 10/22/2020   Hypermobile Ehlers-Danlos syndrome 10/22/2020   Paresthesia of bilateral legs 10/06/2020   Myalgia 10/06/2020   Chronic pelvic pain in female 10/06/2020   Chronic abdominal pain 10/06/2020   Irritable colon 08/02/2020   Pelvic floor dysfunction 08/02/2020   Irritant dermatitis 08/02/2020   Gastroesophageal reflux disease 06/28/2020   Severe recurrent major depression without psychotic features (HCC) 06/28/2020   Polyarthralgia 06/28/2020   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 06/28/2020   Tachycardia 06/28/2020   History of COVID-19 06/28/2020   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 deficit 06/28/2020   Recurrent major depressive disorder, in full remission (HCC) 06/28/2020   Atypical chest pain 06/22/2020   Dizziness 06/22/2020   Palpitations 06/14/2020   Anxiety 05/04/2020   Internal and  external hemorrhoids without complication 04/29/2020   Nondiabetic gastroparesis 03/03/2020   ETD (Eustachian tube dysfunction), bilateral 10/21/2019   Acne vulgaris 03/11/2014   Mild intermittent asthma without complication 03/11/2014   Seasonal allergies 03/11/2014    Past Surgical History:  Procedure Laterality Date   CHOLECYSTECTOMY  05/09/2021   ESOPHAGOGASTRODUODENOSCOPY ENDOSCOPY     03/2020   HIP ARTHROSCOPY Right 04/25/2022   Procedure: RIGHT HIP ARTHROSCOPY WITH LABRAL RECONSTRUCTION, ILIOTIBIAL BAND ALLOGRAFT, PSOAS RELEASE;  Surgeon: Wilhelmenia Harada,  MD;  Location: [facility];  Service: Orthopedics;  Laterality: Right;   JOINT REPLACEMENT     Upcoming THR right hip 09/03/23   LABRAL REPAIR Right 03/02/2021   Procedure: RIGHT HIP ARTHROSCOPY WITH LABRAL REPAIR AND PINCE DEBRIDEMENT;  Surgeon: Wilhelmenia Harada, MD;  Location: [facility];  Service: Orthopedics;  Laterality: Right;   SMART PILL PROCEDURE     12/2020   TYMPANOSTOMY TUBE PLACEMENT Bilateral    WISDOM TOOTH EXTRACTION      OB History     Gravida  1   Para  0   Term      Preterm  0   AB  1   Living  0      SAB      IAB  1   Ectopic  0   Multiple      Live Births  0            Home Medications    Prior to Admission medications   Medication Sig Start Date End Date Taking? Authorizing Provider  nystatin (MYCOSTATIN) 100000 UNIT/ML suspension Take 5 mLs (500,000 Units total) by mouth 4 (four) times daily for 7 days. Swish in mouth and hold in the mouth as long as possible then spit 10/07/23 10/14/23 Yes Mikiyah Glasner, Jodi R, NP  clonazePAM (KLONOPIN) 1 MG tablet Take 1 mg by mouth daily as needed. 06/18/23   [provider]  colestipol (COLESTID) 1 g tablet Take by mouth 2 (two) times daily. 07/03/23   [provider]  levocetirizine (XYZAL) 5 MG tablet Take 5 mg by mouth 2 (two) times daily. 05/14/23   [provider]  levonorgestrel  (MIRENA ) 20 MCG/DAY IUD 1 each by Intrauterine route once.    [provider]  loratadine  (CLARITIN ) 10 MG tablet Take 10 mg by mouth at bedtime. 06/20/23 06/19/24  [provider]  Olopatadine HCl 0.6 % SOLN Place into the nose.    [provider]  ondansetron  (ZOFRAN  ODT) 4 MG disintegrating tablet Take 1 tablet (4 mg total) by mouth every 8 (eight) hours as needed for nausea or vomiting. 11/30/20   Merriam Abbey, DO  prochlorperazine (COMPAZINE) 10 MG tablet  10/03/22   [provider]  promethazine  (PHENERGAN ) 25 MG tablet Take 1 tablet (25 mg total) by mouth every 6 (six) hours as  needed for nausea or vomiting. 08/28/23   Christel Cousins, MD  Psyllium (GERI-MUCIL) 25 % POWD Take by mouth daily as needed. 2 tsp    [provider]  traMADol  (ULTRAM ) 50 MG tablet Take 50 mg by mouth every 6 (six) hours as needed.    [provider]  triamcinolone  cream (KENALOG ) 0.1 % Apply 1 Application topically daily as needed (Eczma).    [provider]  Vilazodone HCl (VIIBRYD) 40 MG TABS Take 40 mg by mouth daily. 04/22/23   [provider]  Vitamin D-Vitamin K (VITAMIN K2-VITAMIN D3 PO) Take 125 mcg by mouth daily.    [provider]    Family History Family History  Problem Relation Age of Onset   Diabetes Mother    Polycystic ovary syndrome Mother    Anxiety disorder Mother    Arthritis Mother    Kidney disease Mother    Obesity Mother    GER disease Father    Arthritis Father    Hypertension Father    ADD / ADHD Brother    Obesity Brother    Heart disease Paternal Uncle    COPD Maternal Grandmother    Cancer Paternal Aunt     Social History Social History   Tobacco Use   Smoking status: Former    Current packs/day: 0.00    Types: Cigarettes, E-cigarettes    Quit date: 2021    Years since quitting: 4.3   Smokeless tobacco: Never   Tobacco comments:    Have not used in 4 years  Vaping Use   Vaping status: Former   Substances: Financial trader  Substance Use Topics   Alcohol use: Not Currently    Comment: occ   Drug use: Not Currently    Frequency: 7.0 times per week    Types: Marijuana    Comment: daily     Allergies   Clindamycin, Molds & smuts, Tretinoin, Amoxicillin , and Famotidine   Review of Systems Review of Systems  HENT:         Possible thrush     Physical Exam Triage Vital Signs ED Triage Vitals [10/07/23 1425]  Encounter Vitals Group     BP 125/81     Systolic BP Percentile      Diastolic BP Percentile      Pulse Rate 76     Resp 18     Temp 98.1 F (36.7 C)     Temp  Source Oral     SpO2 98 %     Weight      Height      Head Circumference      Peak Flow      Pain Score 0     Pain Loc      Pain Education      Exclude from Growth Chart    No data found.  Updated Vital Signs BP 125/81 (BP Location: Right Arm)   Pulse 76   Temp 98.1 F (36.7 C) (Oral)   Resp 18   LMP 09/16/2023 (Exact Date)   SpO2 98%   Visual Acuity Right Eye Distance:   Left Eye Distance:   Bilateral Distance:    Right Eye Near:   Left Eye Near:    Bilateral Near:     Physical Exam Vitals and nursing note reviewed.  Constitutional:      General: She is not in acute distress.    Appearance: Normal appearance. She is not ill-appearing.  HENT:     Head: Normocephalic and atraumatic.     Mouth/Throat:     Lips: Pink.     Tongue: No lesions. Tongue does not deviate from midline.     Pharynx: Oropharynx is clear. No pharyngeal swelling or oropharyngeal exudate.     Comments: There is a slight white film on the inside of bilateral cheeks. Eyes:     Pupils: Pupils are equal, round, and reactive to light.  Cardiovascular:     Rate and Rhythm: Normal rate.  Pulmonary:     Effort: Pulmonary effort is normal.  Skin:    General: Skin is warm and dry.  Neurological:  General: No focal deficit present.     Mental Status: She is alert and oriented to person, place, and time.  Psychiatric:        Mood and Affect: Mood normal.        Behavior: Behavior normal.      UC Treatments / Results  Labs (all labs ordered are listed, but only abnormal results are displayed) Labs Reviewed - No data to display  EKG   Radiology No results found.  Procedures Procedures (including critical care time)  Medications Ordered in UC Medications - No data to display  Initial Impression / Assessment and Plan / UC Course  I have reviewed the triage vital signs and the nursing notes.  Pertinent labs & imaging results that were available during my care of the patient were  reviewed by me and considered in my medical decision making (see chart for details).     Reviewed exam and symptoms with patient.  No red flags.  Will start nystatin 4 times a day for a week.  Advised to follow-up with PCP if symptoms do not improve.  ER precautions reviewed and patient verbalized understanding. Final Clinical Impressions(s) / UC Diagnoses   Final diagnoses:  Oral thrush   Discharge Instructions      Start nystatin 4 times a day for 7 days.  Use swish this around the mouth and holding your mouth as long as you possibly can and then spit.  Follow-up with your PCP if your symptoms do not improve.  Please go to the ER for any worsening symptoms.  Hope you feel better soon!  ED Prescriptions     Medication Sig Dispense Auth. Provider   nystatin (MYCOSTATIN) 100000 UNIT/ML suspension Take 5 mLs (500,000 Units total) by mouth 4 (four) times daily for 7 days. Swish in mouth and hold in the mouth as long as possible then spit 60 mL Analissa Bayless, Jodi R, NP      PDMP not reviewed this encounter.   Alleen Arbour, NP 10/07/23 [phone]

## 2023-10-07 NOTE — ED Triage Notes (Signed)
 Pt present with c/o possible oral yeast infection. Pt states her tongue has been white x 7 days. States she recently was taking doxycycline that caused a yeast infection.

## 2023-10-07 NOTE — Discharge Instructions (Addendum)
 Start nystatin 4 times a day for 7 days.  Use swish this around the mouth and holding your mouth as long as you possibly can and then spit.  Follow-up with your PCP if your symptoms do not improve.  Please go to the ER for any worsening symptoms.  Hope you feel better soon!

## 2023-10-08 ENCOUNTER — Ambulatory Visit: Admitting: Physician Assistant

## 2023-10-08 DIAGNOSIS — F332 Major depressive disorder, recurrent severe without psychotic features: Secondary | ICD-10-CM | POA: Diagnosis not present

## 2023-10-09 DIAGNOSIS — F4312 Post-traumatic stress disorder, chronic: Secondary | ICD-10-CM | POA: Diagnosis not present

## 2023-10-09 DIAGNOSIS — F332 Major depressive disorder, recurrent severe without psychotic features: Secondary | ICD-10-CM | POA: Diagnosis not present

## 2023-10-09 DIAGNOSIS — F411 Generalized anxiety disorder: Secondary | ICD-10-CM | POA: Diagnosis not present

## 2023-10-10 ENCOUNTER — Ambulatory Visit

## 2023-10-13 ENCOUNTER — Ambulatory Visit
Admission: RE | Admit: 2023-10-13 | Discharge: 2023-10-13 | Disposition: A | Discharge status: Home / Self Care | Source: Ambulatory Visit | Attending: Family Medicine

## 2023-10-13 VITALS — BP 115/78 | HR 86 | Temp 98.5°F | Resp 16

## 2023-10-13 DIAGNOSIS — L739 Follicular disorder, unspecified: Secondary | ICD-10-CM

## 2023-10-13 DIAGNOSIS — B370 Candidal stomatitis: Secondary | ICD-10-CM

## 2023-10-13 MED ORDER — NYSTATIN 100000 UNIT/ML MT SUSP: 5.0000 mL | Freq: Four times a day (QID) | OROMUCOSAL | 0 refills | Status: AC

## 2023-10-13 NOTE — ED Triage Notes (Signed)
 Pt c/o "what looks like 2 bumps" to pelvic area-first noticed 2 days ago-NAD-steady gait

## 2023-10-13 NOTE — ED Provider Notes (Signed)
 [facility]  Note:  This document was prepared using Conservation officer, historic buildings and may include unintentional dictation errors.  MRN: [id number] DOB: 05/21/1996  Subjective:   Brianna [name] is a 28 y.o. female presenting for 2-day history of recurrent bumps over the groin and pubic area.  Patient has significant concern about infection as this has caused delays in hip surgery for hip dysplasia.  She also did have recent unprotected sex but denies any vesicular lesions.  No history of HSV.  Denies fever, n/v, abdominal pain, pelvic pain, dysuria, urinary frequency, hematuria, vaginal discharge.  Of note, she was recently treated with doxycycline for the same type of lesions.   No current facility-administered medications for this encounter.  Current Outpatient Medications:    clonazePAM (KLONOPIN) 1 MG tablet, Take 1 mg by mouth daily as needed., Disp: , Rfl:    colestipol (COLESTID) 1 g tablet, Take by mouth 2 (two) times daily., Disp: , Rfl:    levocetirizine (XYZAL) 5 MG tablet, Take 5 mg by mouth 2 (two) times daily., Disp: , Rfl:    levonorgestrel  (MIRENA ) 20 MCG/DAY IUD, 1 each by Intrauterine route once., Disp: , Rfl:    loratadine  (CLARITIN ) 10 MG tablet, Take 10 mg by mouth at bedtime., Disp: , Rfl:    nystatin  (MYCOSTATIN ) 100000 UNIT/ML suspension, Take 5 mLs (500,000 Units total) by mouth 4 (four) times daily for 7 days. Swish in mouth and hold in the mouth as long as possible then spit, Disp: 60 mL, Rfl: 0   Olopatadine HCl 0.6 % SOLN, Place into the nose., Disp: , Rfl:    ondansetron  (ZOFRAN  ODT) 4 MG disintegrating tablet, Take 1 tablet (4 mg total) by mouth every 8 (eight) hours as needed for nausea or vomiting., Disp: 20 tablet, Rfl: 5   prochlorperazine (COMPAZINE) 10 MG tablet, , Disp: , Rfl:    promethazine  (PHENERGAN ) 25 MG tablet, Take 1 tablet (25 mg total) by mouth every 6 (six) hours as needed for nausea or vomiting., Disp: 30  tablet, Rfl: 1   Psyllium (GERI-MUCIL) 25 % POWD, Take by mouth daily as needed. 2 tsp, Disp: , Rfl:    traMADol  (ULTRAM ) 50 MG tablet, Take 50 mg by mouth every 6 (six) hours as needed., Disp: , Rfl:    triamcinolone  cream (KENALOG ) 0.1 %, Apply 1 Application topically daily as needed (Eczma)., Disp: , Rfl:    Vilazodone HCl (VIIBRYD) 40 MG TABS, Take 40 mg by mouth daily., Disp: , Rfl:    Vitamin D-Vitamin K (VITAMIN K2-VITAMIN D3 PO), Take 125 mcg by mouth daily., Disp: , Rfl:    Allergies  Allergen Reactions   Clindamycin Hives, Other (See Comments), Rash and Swelling    Other reaction(s): swelling   Molds & Smuts Other (See Comments), Hives and Itching    Other Reaction(s): Abdominal Pain   Tretinoin Dermatitis, Itching, Other (See Comments), Photosensitivity, Rash and Swelling   Amoxicillin  Diarrhea   Famotidine Diarrhea    Past Medical History:  Diagnosis Date   Acute recurrent pansinusitis 10/21/2019   ADHD (attention deficit hyperactivity disorder)    inattentive   Allergy    Anemia    had iron infusions in Fall of 2023   Anxiety    Arthritis    Joints, hips   Asthma    EIA   Bronchitis    Cat allergy due to both airborne and skin contact 05/01/2016   Chronic sore throat 05/01/2016   COVID 2020  Depression    Fibromyalgia    pt denies   Gastroparesis    GERD (gastroesophageal reflux disease)    History of hypotension    taken midodrine  in the past- Spring of 2022   Hypermobile Ehlers-Danlos syndrome    Irregular heart rate    Keratosis pilaris    Migraine    w and w/o auras   PCOS (polycystic ovarian syndrome) 10/26/2022   PONV (postoperative nausea and vomiting)    "I can get really cold"   PTSD (post-traumatic stress disorder)    Seasonal allergic rhinitis 03/11/2014     Past Surgical History:  Procedure Laterality Date   CHOLECYSTECTOMY  05/09/2021   ESOPHAGOGASTRODUODENOSCOPY ENDOSCOPY     03/2020   HIP ARTHROSCOPY Right 04/25/2022    Procedure: RIGHT HIP ARTHROSCOPY WITH LABRAL RECONSTRUCTION, ILIOTIBIAL BAND ALLOGRAFT, PSOAS RELEASE;  Surgeon: Wilhelmenia Harada, MD;  Location: [facility] OR;  Service: Orthopedics;  Laterality: Right;   JOINT REPLACEMENT     Upcoming THR right hip 09/03/23   LABRAL REPAIR Right 03/02/2021   Procedure: RIGHT HIP ARTHROSCOPY WITH LABRAL REPAIR AND PINCE DEBRIDEMENT;  Surgeon: Wilhelmenia Harada, MD;  Location: [facility];  Service: Orthopedics;  Laterality: Right;   SMART PILL PROCEDURE     12/2020   TYMPANOSTOMY TUBE PLACEMENT Bilateral    WISDOM TOOTH EXTRACTION      Family History  Problem Relation Age of Onset   Diabetes Mother    Polycystic ovary syndrome Mother    Anxiety disorder Mother    Arthritis Mother    Kidney disease Mother    Obesity Mother    GER disease Father    Arthritis Father    Hypertension Father    ADD / ADHD Brother    Obesity Brother    Heart disease Paternal Uncle    COPD Maternal Grandmother    Cancer Paternal Aunt     Social History   Tobacco Use   Smoking status: Former    Current packs/day: 0.00    Types: Cigarettes, E-cigarettes    Quit date: 2021    Years since quitting: 4.3   Smokeless tobacco: Never   Tobacco comments:    Have not used in 4 years  Vaping Use   Vaping status: Former   Substances: Financial trader  Substance Use Topics   Alcohol use: Not Currently   Drug use: Yes    Types: Marijuana    ROS   Objective:   Vitals: BP 115/78 (BP Location: Right Arm)   Pulse 86   Temp 98.5 F (36.9 C) (Oral)   Resp 16   LMP 09/16/2023 (Exact Date)   SpO2 99%   Physical Exam Constitutional:      General: She is not in acute distress.    Appearance: Normal appearance. She is well-developed. She is not ill-appearing, toxic-appearing or diaphoretic.  HENT:     Head: Normocephalic and atraumatic.     Nose: Nose normal.     Mouth/Throat:     Mouth: Mucous membranes are moist.  Eyes:     General: No scleral icterus.       Right eye:  No discharge.        Left eye: No discharge.     Extraocular Movements: Extraocular movements intact.  Cardiovascular:     Rate and Rhythm: Normal rate.  Pulmonary:     Effort: Pulmonary effort is normal.  Skin:    General: Skin is warm and dry.  Comments: RN Presnell and CMA Tyrone Gallop both assisted at different times due to the patient changing her mind about an HSV culture.  Neurological:     General: No focal deficit present.     Mental Status: She is alert and oriented to person, place, and time.  Psychiatric:        Mood and Affect: Mood normal.        Behavior: Behavior normal.     Assessment and Plan :   PDMP not reviewed this encounter.  1. Folliculitis   2. Oral thrush    Had an extensive discussion with the patient about the differential which I primarily suspect includes folliculitis as the primary diagnosis.  Patient has significant distress about the possibility of using antibiotics and delaying her hip surgery further.  As such, I recommended conservative management including warm compresses, good skin care.  Avoid shaving or anything abrasive to the hair of the pubic region.  Ultimately, patient change her mind about an HSV swab.  At discharge she also requested more nystatin  to address oral thrush.  States that she was not provided with enough medication when she was seen last time at our clinic.  I was agreeable to this.  I also advised patient that if she gets in touch with her surgeon on Tuesday, she can contact me on Wednesday to see if it would be reasonable for her to try an oral or topical antibiotic without delaying her surgery.     Adolph Hoop, PA-C 10/13/23 1336

## 2023-10-13 NOTE — Discharge Instructions (Signed)
 Let's hold off on using any antibiotics out of the risk of having to reschedule your hip surgery as a result. For now, I recommend washing the area in question with Dial antibacterial soap and warm water 2-3 times daily. You can use exfoliation. If your symptoms persist including more redness, pain, pustule like lesions you can return to the clinic for a recheck/swab OR you can call on Wednesday when I return to work and I will send out a prescription for an oral antibiotic to address bacterial folliculitis.

## 2023-10-16 DIAGNOSIS — F4312 Post-traumatic stress disorder, chronic: Secondary | ICD-10-CM | POA: Diagnosis not present

## 2023-10-16 DIAGNOSIS — F332 Major depressive disorder, recurrent severe without psychotic features: Secondary | ICD-10-CM | POA: Diagnosis not present

## 2023-10-16 DIAGNOSIS — F411 Generalized anxiety disorder: Secondary | ICD-10-CM | POA: Diagnosis not present

## 2023-10-18 ENCOUNTER — Encounter: Admitting: Psychology

## 2023-10-20 DIAGNOSIS — F332 Major depressive disorder, recurrent severe without psychotic features: Secondary | ICD-10-CM | POA: Diagnosis not present

## 2023-10-22 DIAGNOSIS — Z87891 Personal history of nicotine dependence: Secondary | ICD-10-CM | POA: Diagnosis not present

## 2023-10-22 DIAGNOSIS — M1611 Unilateral primary osteoarthritis, right hip: Secondary | ICD-10-CM | POA: Diagnosis not present

## 2023-10-22 DIAGNOSIS — G8918 Other acute postprocedural pain: Secondary | ICD-10-CM | POA: Diagnosis not present

## 2023-10-22 DIAGNOSIS — K219 Gastro-esophageal reflux disease without esophagitis: Secondary | ICD-10-CM | POA: Diagnosis not present

## 2023-10-22 DIAGNOSIS — L905 Scar conditions and fibrosis of skin: Secondary | ICD-10-CM | POA: Diagnosis not present

## 2023-10-22 DIAGNOSIS — F325 Major depressive disorder, single episode, in full remission: Secondary | ICD-10-CM | POA: Diagnosis not present

## 2023-10-22 DIAGNOSIS — Z96641 Presence of right artificial hip joint: Secondary | ICD-10-CM | POA: Diagnosis not present

## 2023-10-22 DIAGNOSIS — Q7962 Hypermobile Ehlers-Danlos syndrome: Secondary | ICD-10-CM | POA: Diagnosis not present

## 2023-10-22 DIAGNOSIS — F419 Anxiety disorder, unspecified: Secondary | ICD-10-CM | POA: Diagnosis not present

## 2023-10-22 DIAGNOSIS — K582 Mixed irritable bowel syndrome: Secondary | ICD-10-CM | POA: Diagnosis not present

## 2023-10-22 DIAGNOSIS — D62 Acute posthemorrhagic anemia: Secondary | ICD-10-CM | POA: Diagnosis not present

## 2023-10-22 DIAGNOSIS — Z881 Allergy status to other antibiotic agents status: Secondary | ICD-10-CM | POA: Diagnosis not present

## 2023-10-22 DIAGNOSIS — D72829 Elevated white blood cell count, unspecified: Secondary | ICD-10-CM | POA: Diagnosis not present

## 2023-10-22 DIAGNOSIS — J4520 Mild intermittent asthma, uncomplicated: Secondary | ICD-10-CM | POA: Diagnosis not present

## 2023-10-22 DIAGNOSIS — I951 Orthostatic hypotension: Secondary | ICD-10-CM | POA: Diagnosis not present

## 2023-10-22 DIAGNOSIS — K3184 Gastroparesis: Secondary | ICD-10-CM | POA: Diagnosis not present

## 2023-10-22 DIAGNOSIS — G909 Disorder of the autonomic nervous system, unspecified: Secondary | ICD-10-CM | POA: Diagnosis not present

## 2023-10-22 DIAGNOSIS — M25551 Pain in right hip: Secondary | ICD-10-CM | POA: Diagnosis not present

## 2023-10-23 NOTE — Therapy (Deleted)
 OUTPATIENT PHYSICAL THERAPY LOWER EXTREMITY EVALUATION   Patient Name: Brianna [name] MRN: [id number] DOB:05-03-1996, 28 y.o., female Today's Date: 10/23/2023  END OF SESSION:   Past Medical History:  Diagnosis Date   Acute recurrent pansinusitis 10/21/2019   ADHD (attention deficit hyperactivity disorder)    inattentive   Allergy    Anemia    had iron infusions in Fall of 2023   Anxiety    Arthritis    Joints, hips   Asthma    EIA   Bronchitis    Cat allergy due to both airborne and skin contact 05/01/2016   Chronic sore throat 05/01/2016   COVID 2020   Depression    Fibromyalgia    pt denies   Gastroparesis    GERD (gastroesophageal reflux disease)    History of hypotension    taken midodrine  in the past- Spring of 2022   Hypermobile Ehlers-Danlos syndrome    Irregular heart rate    Keratosis pilaris    Migraine    w and w/o auras   PCOS (polycystic ovarian syndrome) 10/26/2022   PONV (postoperative nausea and vomiting)    "I can get really cold"   PTSD (post-traumatic stress disorder)    Seasonal allergic rhinitis 03/11/2014   Past Surgical History:  Procedure Laterality Date   CHOLECYSTECTOMY  05/09/2021   ESOPHAGOGASTRODUODENOSCOPY ENDOSCOPY     03/2020   HIP ARTHROSCOPY Right 04/25/2022   Procedure: RIGHT HIP ARTHROSCOPY WITH LABRAL RECONSTRUCTION, ILIOTIBIAL BAND ALLOGRAFT, PSOAS RELEASE;  Surgeon: Wilhelmenia Harada, MD;  Location: [facility] OR;  Service: Orthopedics;  Laterality: Right;   JOINT REPLACEMENT     Upcoming THR right hip 09/03/23   LABRAL REPAIR Right 03/02/2021   Procedure: RIGHT HIP ARTHROSCOPY WITH LABRAL REPAIR AND PINCE DEBRIDEMENT;  Surgeon: Wilhelmenia Harada, MD;  Location: [facility] OR;  Service: Orthopedics;  Laterality: Right;   SMART PILL PROCEDURE     12/2020   TYMPANOSTOMY TUBE PLACEMENT Bilateral    WISDOM TOOTH EXTRACTION     Patient Active Problem List   Diagnosis Date Noted   Chronic night sweats 08/21/2023   PCOS (polycystic  ovarian syndrome) 10/26/2022   Cervical adenopathy 06/10/2021   Chronic pruritus 06/10/2021   Exercise-induced asthma 06/09/2021   Chronic venous insufficiency 06/03/2021   Orthostatic lightheadedness 06/03/2021   Purple toe syndrome of both feet (HCC) 06/03/2021   Autonomic dysfunction 06/03/2021   Tear of right acetabular labrum 04/13/2021   Migraine headache 03/01/2021   Headache 11/30/2020   Positive ANA (antinuclear antibody) 11/15/2020   Fibromyalgia 11/02/2020   Hair loss 11/02/2020   Muscle spasticity 10/22/2020   Hypermobile Ehlers-Danlos syndrome 10/22/2020   Paresthesia of bilateral legs 10/06/2020   Myalgia 10/06/2020   Chronic pelvic pain in female 10/06/2020   Chronic abdominal pain 10/06/2020   Irritable colon 08/02/2020   Pelvic floor dysfunction 08/02/2020   Irritant dermatitis 08/02/2020   Gastroesophageal reflux disease 06/28/2020   Severe recurrent major depression without psychotic features (HCC) 06/28/2020   Polyarthralgia 06/28/2020   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 06/28/2020   Tachycardia 06/28/2020   History of COVID-19 06/28/2020   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 deficit 06/28/2020   Recurrent major depressive disorder, in full remission (HCC) 06/28/2020   Atypical chest pain 06/22/2020   Dizziness 06/22/2020   Palpitations 06/14/2020   Anxiety 05/04/2020   Internal and external hemorrhoids without complication 04/29/2020   Nondiabetic gastroparesis 03/03/2020   ETD (Eustachian tube dysfunction), bilateral 10/21/2019   Acne vulgaris 03/11/2014  Mild intermittent asthma without complication 03/11/2014   Seasonal allergies 03/11/2014    PCP: ***  REFERRING PROVIDER: ***  REFERRING DIAG: ***  THERAPY DIAG:  No diagnosis found.  Rationale for Evaluation and Treatment: {HABREHAB:27488}  ONSET DATE: ***  SUBJECTIVE:   SUBJECTIVE STATEMENT: Weightbearing as tolerated. Anterior Hip Precautions.      PERTINENT HISTORY: Past Medical History: Patient has a past medical history of Anemia, Anxiety (2016), Arthritis, Asthma, unspecified asthma severity, unspecified whether complicated, unspecified whether persistent (HHS-HCC) (2006), Depression (2013), GERD (gastroesophageal reflux disease), Hormone deficiency, PONV (postoperative nausea and vomiting), and Psychological trauma (2013).  Past Surgical History: Patient has a past surgical history that includes Hip arthroscopy (04/25/2022) and Cholecystectomy (05/09/2021).   Chronic postural LH/dizziness without syncope or presyncope. Previously evaluated by cardiology at [facility] in 2022 with consideration of POTS - nondiagnostic tilt table test, Holter monitor w/o significant arrhythmias and TTE in [address]109 with no WMA, EF>55%. 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   R TOTAL HIP ARTHROPLASTY  PR FLUOROSCOPY UP TO 1 HOUR PHYSICIAN/QHP TIME  ANTERIOR ARTHROPLASTY, ACETABULAR AND PROXIMAL FEMORAL PROSTHETIC REPLACEMENT (TOTAL HIP ARTHROPLASTY), WITH OR WITHOUT AUTOGRAFT OR ALLOGRAFT  FLUOROSCOPY (SEPARATE PROCEDURE), UP TO 1 HOUR PHYSICIAN OR OTHER QUALIFIED HEALTH CARE PROFESSIONAL TIME  PAIN:  Are you having pain? {OPRCPAIN:27236}  PRECAUTIONS: {Therapy precautions:24002}  RED FLAGS: {PT Red Flags:29287}   WEIGHT BEARING RESTRICTIONS: {Yes ***/No:24003}  FALLS:  Has patient fallen in last 6 months? {fallsyesno:27318}  LIVING ENVIRONMENT: Lives with: {OPRC lives with:25569::"lives with their family"} Lives in: {Lives in:25570} Stairs: {opstairs:27293} Has following equipment at home: {Assistive devices:23999}  OCCUPATION: ***  PLOF: {PLOF:24004}  PATIENT GOALS: ***  NEXT MD VISIT: ***  OBJECTIVE:  Note: Objective measures were completed at Evaluation unless otherwise noted.  DIAGNOSTIC FINDINGS: ***  PATIENT SURVEYS:  {rehab surveys:24030}  COGNITION: Overall cognitive status:  {cognition:24006}     SENSATION: {sensation:27233}  EDEMA:  {edema:24020}  MUSCLE LENGTH: Hamstrings: Right *** deg; Left *** deg Andy Bannister test: Right *** deg; Left *** deg  POSTURE: {posture:25561}  PALPATION: ***  LOWER EXTREMITY ROM:  {AROM/PROM:27142} ROM Right eval Left eval  Hip flexion    Hip extension    Hip abduction    Hip adduction    Hip internal rotation    Hip external rotation    Knee flexion    Knee extension    Ankle dorsiflexion    Ankle plantarflexion    Ankle inversion    Ankle eversion     (Blank rows = not tested)  LOWER EXTREMITY MMT:  MMT Right eval Left eval  Hip flexion    Hip extension    Hip abduction    Hip adduction    Hip internal rotation    Hip external rotation    Knee flexion    Knee extension    Ankle dorsiflexion    Ankle plantarflexion    Ankle inversion    Ankle eversion     (Blank rows = not tested)  LOWER EXTREMITY SPECIAL TESTS:  {LEspecialtests:26242}  FUNCTIONAL TESTS:  {Functional tests:24029}  GAIT: Distance walked: *** Assistive device utilized: {Assistive devices:23999} Level of assistance: {Levels of assistance:24026} Comments: ***  TREATMENT DATE: ***    PATIENT EDUCATION:  Education details: *** Person educated: {Person educated:25204} Education method: {Education Method:25205} Education comprehension: {Education Comprehension:25206}  HOME EXERCISE PROGRAM: ***  ASSESSMENT:  CLINICAL IMPRESSION: Patient is a *** y.o. *** who was seen today for physical therapy evaluation and treatment for ***.   OBJECTIVE IMPAIRMENTS: {opptimpairments:25111}.   ACTIVITY LIMITATIONS: {activitylimitations:27494}  PARTICIPATION LIMITATIONS: {participationrestrictions:25113}  PERSONAL FACTORS: {Personal factors:25162} are also affecting patient's functional outcome.    REHAB POTENTIAL: {rehabpotential:25112}  CLINICAL DECISION MAKING: {clinical decision making:25114}  EVALUATION COMPLEXITY: {Evaluation complexity:25115}   GOALS: Goals reviewed with patient? {yes/no:20286}  SHORT TERM GOALS: Target date: *** *** Baseline: Goal status: INITIAL  2.  *** Baseline:  Goal status: INITIAL  3.  *** Baseline:  Goal status: INITIAL  4.  *** Baseline:  Goal status: INITIAL  5.  *** Baseline:  Goal status: INITIAL  6.  *** Baseline:  Goal status: INITIAL  LONG TERM GOALS: Target date: ***  *** Baseline:  Goal status: INITIAL  2.  *** Baseline:  Goal status: INITIAL  3.  *** Baseline:  Goal status: INITIAL  4.  *** Baseline:  Goal status: INITIAL  5.  *** Baseline:  Goal status: INITIAL  6.  *** Baseline:  Goal status: INITIAL   PLAN:  PT FREQUENCY: {rehab frequency:25116}  PT DURATION: {rehab duration:25117}  PLANNED INTERVENTIONS: {rehab planned interventions:25118::"97110-Therapeutic exercises","97530- Therapeutic 385 [id number] 3559- Neuromuscular re-education","97535- Self UXLK","44010- Manual therapy"}  PLAN FOR NEXT SESSION: ***   Coreena Rubalcava, PT 10/23/2023, 3:51 PM

## 2023-10-24 ENCOUNTER — Telehealth: Payer: Self-pay | Admitting: *Deleted

## 2023-10-24 ENCOUNTER — Ambulatory Visit: Admitting: Physical Therapy

## 2023-10-24 NOTE — Telephone Encounter (Signed)
 Copied from CRM [id number]. Topic: Clinical - Request for Lab/Test Order >> Oct 24, 2023 12:49 PM Luane Rumps D wrote: Reason for CRM: Patient is requesting a Hemoglobin recheck at her upcoming hospital follow-up.

## 2023-10-25 ENCOUNTER — Telehealth: Payer: Self-pay | Admitting: *Deleted

## 2023-10-25 DIAGNOSIS — F4312 Post-traumatic stress disorder, chronic: Secondary | ICD-10-CM | POA: Diagnosis not present

## 2023-10-25 DIAGNOSIS — F332 Major depressive disorder, recurrent severe without psychotic features: Secondary | ICD-10-CM | POA: Diagnosis not present

## 2023-10-25 DIAGNOSIS — F411 Generalized anxiety disorder: Secondary | ICD-10-CM | POA: Diagnosis not present

## 2023-10-25 DIAGNOSIS — K581 Irritable bowel syndrome with constipation: Secondary | ICD-10-CM | POA: Diagnosis not present

## 2023-10-25 DIAGNOSIS — M1611 Unilateral primary osteoarthritis, right hip: Secondary | ICD-10-CM | POA: Diagnosis not present

## 2023-10-25 DIAGNOSIS — Q796 Ehlers-Danlos syndrome, unspecified: Secondary | ICD-10-CM | POA: Diagnosis not present

## 2023-10-25 DIAGNOSIS — I872 Venous insufficiency (chronic) (peripheral): Secondary | ICD-10-CM | POA: Diagnosis not present

## 2023-10-25 NOTE — Telephone Encounter (Signed)
 Copied from CRM [id number]. Topic: Clinical - Home Health Verbal Orders >> Oct 25, 2023  2:23 PM Artemio Larry wrote: Caller/Agency: Polly Brink with [facility] Callback Number: [phone] Service Requested: Physical Therapy Frequency: 2X WEEK FOR 6 WEEKS Any new concerns about the patient? No

## 2023-10-26 ENCOUNTER — Ambulatory Visit: Admitting: Physical Therapy

## 2023-10-26 ENCOUNTER — Telehealth: Payer: Self-pay | Admitting: *Deleted

## 2023-10-26 NOTE — Telephone Encounter (Signed)
 Returned call to [place], gave verbal orders as requested.

## 2023-10-26 NOTE — Telephone Encounter (Signed)
 Copied from CRM [id number]. Topic: Clinical - Home Health Verbal Orders >> Oct 25, 2023  2:23 PM Artemio Larry wrote: Caller/Agency: Polly Brink with [facility] Callback Number: [phone] Service Requested: Physical Therapy Frequency: 2X WEEK FOR 6 WEEKS Any new concerns about the patient? No >> Oct 26, 2023  9:34 AM Heather  M wrote: Returning a call to [place], please call when available. Polly Brink states if it is for verbal orders please feel free to leave a voicemail on his phone as it is secure.   Returned call to [place], gave verbal orders as requested.

## 2023-10-26 NOTE — Telephone Encounter (Signed)
 Polly Brink called back per E2C2 Agent, Heather . I informed Heather  to let Polly Brink know that Elease Grice is currently rooming a patient and will return call at later time. Heather  verbalized understanding and stated she will communicate the message.

## 2023-10-26 NOTE — Telephone Encounter (Signed)
 Left message to return call to office.

## 2023-10-27 ENCOUNTER — Encounter: Payer: Self-pay | Admitting: Family Medicine

## 2023-10-29 ENCOUNTER — Encounter: Admitting: Physical Therapy

## 2023-10-29 ENCOUNTER — Ambulatory Visit: Admitting: Family Medicine

## 2023-10-29 ENCOUNTER — Encounter: Payer: Self-pay | Admitting: Family Medicine

## 2023-10-29 VITALS — BP 138/70 | HR 98 | Temp 98.4°F | Ht 65.0 in | Wt 127.0 lb

## 2023-10-29 DIAGNOSIS — Q796 Ehlers-Danlos syndrome, unspecified: Secondary | ICD-10-CM

## 2023-10-29 DIAGNOSIS — Z96641 Presence of right artificial hip joint: Secondary | ICD-10-CM | POA: Diagnosis not present

## 2023-10-29 DIAGNOSIS — Z09 Encounter for follow-up examination after completed treatment for conditions other than malignant neoplasm: Secondary | ICD-10-CM

## 2023-10-29 DIAGNOSIS — Z7982 Long term (current) use of aspirin: Secondary | ICD-10-CM | POA: Diagnosis not present

## 2023-10-29 DIAGNOSIS — D508 Other iron deficiency anemias: Secondary | ICD-10-CM | POA: Diagnosis not present

## 2023-10-29 DIAGNOSIS — I872 Venous insufficiency (chronic) (peripheral): Secondary | ICD-10-CM | POA: Diagnosis not present

## 2023-10-29 DIAGNOSIS — Z471 Aftercare following joint replacement surgery: Secondary | ICD-10-CM | POA: Diagnosis not present

## 2023-10-29 DIAGNOSIS — K5900 Constipation, unspecified: Secondary | ICD-10-CM

## 2023-10-29 DIAGNOSIS — D649 Anemia, unspecified: Secondary | ICD-10-CM | POA: Diagnosis not present

## 2023-10-29 DIAGNOSIS — K3184 Gastroparesis: Secondary | ICD-10-CM | POA: Diagnosis not present

## 2023-10-29 DIAGNOSIS — J45990 Exercise induced bronchospasm: Secondary | ICD-10-CM | POA: Diagnosis not present

## 2023-10-29 DIAGNOSIS — Z87891 Personal history of nicotine dependence: Secondary | ICD-10-CM | POA: Diagnosis not present

## 2023-10-29 DIAGNOSIS — F419 Anxiety disorder, unspecified: Secondary | ICD-10-CM | POA: Diagnosis not present

## 2023-10-29 DIAGNOSIS — Z604 Social exclusion and rejection: Secondary | ICD-10-CM | POA: Diagnosis not present

## 2023-10-29 DIAGNOSIS — I951 Orthostatic hypotension: Secondary | ICD-10-CM | POA: Diagnosis not present

## 2023-10-29 DIAGNOSIS — K581 Irritable bowel syndrome with constipation: Secondary | ICD-10-CM | POA: Diagnosis not present

## 2023-10-29 DIAGNOSIS — L739 Follicular disorder, unspecified: Secondary | ICD-10-CM | POA: Diagnosis not present

## 2023-10-29 DIAGNOSIS — F3342 Major depressive disorder, recurrent, in full remission: Secondary | ICD-10-CM | POA: Diagnosis not present

## 2023-10-29 DIAGNOSIS — Z79891 Long term (current) use of opiate analgesic: Secondary | ICD-10-CM | POA: Diagnosis not present

## 2023-10-29 DIAGNOSIS — F4310 Post-traumatic stress disorder, unspecified: Secondary | ICD-10-CM | POA: Diagnosis not present

## 2023-10-29 DIAGNOSIS — G43909 Migraine, unspecified, not intractable, without status migrainosus: Secondary | ICD-10-CM | POA: Diagnosis not present

## 2023-10-29 DIAGNOSIS — K219 Gastro-esophageal reflux disease without esophagitis: Secondary | ICD-10-CM | POA: Diagnosis not present

## 2023-10-29 DIAGNOSIS — D640 Hereditary sideroblastic anemia: Secondary | ICD-10-CM | POA: Diagnosis not present

## 2023-10-29 DIAGNOSIS — D62 Acute posthemorrhagic anemia: Secondary | ICD-10-CM | POA: Diagnosis not present

## 2023-10-29 DIAGNOSIS — D72829 Elevated white blood cell count, unspecified: Secondary | ICD-10-CM | POA: Diagnosis not present

## 2023-10-29 DIAGNOSIS — F909 Attention-deficit hyperactivity disorder, unspecified type: Secondary | ICD-10-CM | POA: Diagnosis not present

## 2023-10-29 NOTE — Patient Instructions (Signed)
 It was great seeing you The wound looks great!  Await results of labs to determine further plan.   You are doing well  speedy recovery

## 2023-10-29 NOTE — Progress Notes (Signed)
 Subjective:     [name], female    DOB: [birth year], 28 y.o.   MRN: [id number]  Chief Complaint  Patient presents with   Post-op Problem    R hip; pain is still 7/10; nothing other than icing helps; need bandage change and follow up on some blood work    HPI Discussed the use of AI scribe software for clinical note transcription with the patient, who gave verbal consent to proceed.  History of Present Illness Brianna [name] is a 28 year old female with Ehlers-Danlos syndrome who presents for follow-up after a total right hip replacement.  She underwent a total right hip replacement on October 22, 2023, at [facility], following failed labral repair and reconstruction surgeries in 2022 and 2023 due to undiagnosed hip dysplasia. Post-surgery, she experienced intense pain and required a blood transfusion due to low hemoglobin levels, which rose to 8.1 after the transfusion. She is currently experiencing persistent dizziness and elevated heart rate, which she attributes to her dysautonomia and possible anemia.  She is undergoing home health physical therapy, having had her evaluation on Thursday and her first session this morning. She feels 'heart racy' and very dizzy, especially when moving from sitting to standing. She is managing her pain with  hydromorphone  1.5 mg every other day, and liquid "children's ibuprofen"  twice a day due to gastroparesis.  She has a history of Ehlers-Danlos syndrome and dysautonomia, which complicates her recovery. She also has a history of needing iron infusions about a year and a half ago. She is not currently taking any iron supplements due to past intolerance to oral ferrous sulfate.  Seeing hematology next month  She reports significant constipation, having had her first bowel movement on Saturday after five days. She is taking a mix of senna and docusate daily and is considering Miralax or psyllium husk for relief, but is concerned about the  impact of her gastroparesis.  Her mental health has been poor, with increased anxiety and a breakdown before surgery. She is using Spravato (ketamine nasal spray) and Viibryd (vilazodone) for depression, and has recently restarted Auvelity (bupropion/dextromethorphan) after a breakdown. She feels unsupported by family and friends, which is affecting her mood.  She has a history of gastroparesis, which affects her medication intake, requiring liquid formulations. She experienced vomiting on the way home from the hospital, which also affected her mother.  She reports a recent onset of menstruation on the day of surgery, which is unusual for her, and is concerned about the position of her IUD, as advised by her OB-GYN. She has been experiencing light periods lasting four to five days.    Health Maintenance Due  Topic Date Due   COVID-19 Vaccine (8 - 2024-25 season) 01/21/2023    Past Medical History:  Diagnosis Date   Acute recurrent pansinusitis 10/21/2019   ADHD (attention deficit hyperactivity disorder)    inattentive   Allergy    Anemia    had iron infusions in Fall of 2023   Anxiety    Arthritis    Joints, hips   Asthma    EIA   Bronchitis    Cat allergy due to both airborne and skin contact 05/01/2016   Chronic sore throat 05/01/2016   COVID 2020   Depression    Ehlers-Danlos syndrome    Fibromyalgia    pt denies   Gastroparesis    GERD (gastroesophageal reflux disease)    History of hypotension    taken midodrine  in the past-  Spring of 2022   Hypermobile Ehlers-Danlos syndrome    Irregular heart rate    Keratosis pilaris    Migraine    w and w/o auras   PCOS (polycystic ovarian syndrome) 10/26/2022   PONV (postoperative nausea and vomiting)    "I can get really cold"   PTSD (post-traumatic stress disorder)    Seasonal allergic rhinitis 03/11/2014    Past Surgical History:  Procedure Laterality Date   CHOLECYSTECTOMY  05/09/2021   ESOPHAGOGASTRODUODENOSCOPY  ENDOSCOPY     03/2020   HIP ARTHROPLASTY Right    HIP ARTHROSCOPY Right 04/25/2022   Procedure: RIGHT HIP ARTHROSCOPY WITH LABRAL RECONSTRUCTION, ILIOTIBIAL BAND ALLOGRAFT, PSOAS RELEASE;  Surgeon: Wilhelmenia Harada, MD;  Location: [facility] OR;  Service: Orthopedics;  Laterality: Right;   JOINT REPLACEMENT     Upcoming THR right hip 09/03/23   LABRAL REPAIR Right 03/02/2021   Procedure: RIGHT HIP ARTHROSCOPY WITH LABRAL REPAIR AND PINCE DEBRIDEMENT;  Surgeon: Wilhelmenia Harada, MD;  Location: [facility] OR;  Service: Orthopedics;  Laterality: Right;   SMART PILL PROCEDURE     12/2020   TYMPANOSTOMY TUBE PLACEMENT Bilateral    WISDOM TOOTH EXTRACTION       Current Outpatient Medications:    clonazePAM (KLONOPIN) 1 MG tablet, Take 1 mg by mouth daily as needed., Disp: , Rfl:    colestipol (COLESTID) 1 g tablet, Take by mouth 2 (two) times daily., Disp: , Rfl:    HYDROmorphone  HCl (DILAUDID ) 1 MG/ML LIQD, Take by mouth., Disp: , Rfl:    levonorgestrel  (MIRENA ) 20 MCG/DAY IUD, 1 each by Intrauterine route once., Disp: , Rfl:    loratadine  (CLARITIN ) 10 MG tablet, Take 10 mg by mouth at bedtime., Disp: , Rfl:    ondansetron  (ZOFRAN  ODT) 4 MG disintegrating tablet, Take 1 tablet (4 mg total) by mouth every 8 (eight) hours as needed for nausea or vomiting., Disp: 20 tablet, Rfl: 5   prochlorperazine (COMPAZINE) 10 MG tablet, , Disp: , Rfl:    promethazine  (PHENERGAN ) 25 MG tablet, Take 1 tablet (25 mg total) by mouth every 6 (six) hours as needed for nausea or vomiting., Disp: 30 tablet, Rfl: 1   Psyllium (GERI-MUCIL) 25 % POWD, Take by mouth daily as needed. 2 tsp, Disp: , Rfl:    triamcinolone  cream (KENALOG ) 0.1 %, Apply 1 Application topically daily as needed (Eczma)., Disp: , Rfl:    Vilazodone HCl (VIIBRYD) 40 MG TABS, Take 40 mg by mouth daily., Disp: , Rfl:    Vitamin D-Vitamin K (VITAMIN K2-VITAMIN D3 PO), Take 125 mcg by mouth daily., Disp: , Rfl:   Allergies  Allergen Reactions   Clindamycin Hives,  Other (See Comments), Rash and Swelling    Other reaction(s): swelling   Molds & Smuts Other (See Comments), Hives and Itching    Other Reaction(s): Abdominal Pain   Tretinoin Dermatitis, Itching, Other (See Comments), Photosensitivity, Rash and Swelling   Amoxicillin  Diarrhea   Famotidine Diarrhea   ROS neg/noncontributory except as noted HPI/below      Objective:      BP 138/70   Pulse 98   Temp 98.4 F (36.9 C)   Ht 5\' 5"  (1.651 m)   Wt 127 lb (57.6 kg)   LMP 09/16/2023 (Exact Date)   SpO2 100%   BMI 21.13 kg/m  Wt Readings from Last 3 Encounters:  10/29/23 127 lb (57.6 kg)  08/20/23 120 lb 6 oz (54.6 kg)  04/23/23 115 lb (52.2 kg)    Physical Exam  Gen: WDWN NAD HEENT: NCAT, conjunctiva not injected, sclera nonicteric NECK:  supple, no thyromegaly, no nodes, no carotid bruits CARDIAC: tachyRRR, S1S2+, no murmur. DP 2+B LUNGS: CTAB. No wheezes EXT:  no edema MSK: no gross abnormalities.  NEURO: A&O x3.  CN II-XII intact.  PSYCH: normal mood. Good eye contact  R upper thigh-very adherent drsg-removed.  Replaced few steri stips and used telfa and tape for drsg.  Pt was tearful d/t pain but tolerated procedure well     Assessment & Plan:  Other iron deficiency anemia -     CBC with Differential/Platelet -     Iron, TIBC and Ferritin Panel -     Vitamin B12 -     Comprehensive metabolic panel with [facility]  Hospital discharge follow-up  Meds reconciled and no changes to add  Assessment and Plan Assessment & Plan Postoperative anemia   Following a total right hip replacement on October 22, 2023, she experiences postoperative anemia with persistent tachycardia and dizziness. Low hemoglobin levels required a blood transfusion during hospitalization. She has a history of intolerance to oral ferrous sulfate. Elevated heart rate may result from anemia and dysautonomia, with compensatory mechanisms for low hemoglobin levels. Order blood tests for hemoglobin, iron studies,  B12 levels, and other relevant labs. Consider iron supplementation based on lab results. Discuss potential referral to hematology for iron infusions if oral iron is not tolerated and anemia is severe. Send blood samples to [facility] for timely results.  Dysautonomia   Dysautonomia contributes to tachycardia and dizziness, especially postoperatively, potentially exacerbating the response to anemia.  Postoperative pain management   Postoperative pain from the hip replacement is managed with hydromorphone , ibuprofen , and ice. Pain management is complicated by gastroparesis, limiting medication options. Continue the current pain management regimen and encourage the use of ice for pain relief.  Constipation   Constipation is likely due to narcotic use and gastroparesis. She has been using senna and docusate and inquiring about Miralax or psyllium husk. Recommend Miralax once or twice daily, adjusting based on bowel movement consistency. Advise on the use of psyllium husk with caution due to gastroparesis and encourage continued use of stool softeners as needed.  Ehlers-Danlos syndrome   Ehlers-Danlos syndrome may contribute to complications in surgical recovery and pain management.  Mental health management   She experiences poor mental health and is currently using Spravato (ketamine nasal spray) and Viibryd (antidepressant). Recently restarted Auvelity (bupropion, dextromethorphan) due to inadequate response to Viibryd. Reports lack of social support and recent emotional distress. Continue current mental health medications and follow up with a mental health provider virtually. Encourage seeking additional social support.  Wound care post-hip replacement   A waterproof adherent dressing is over the surgical incision. Keep the dressing on for seven days unless there is drainage. If drainage occurs, apply a light gauze dressing and monitor for signs of infection. Remove the dressing after seven days if  no drainage is present. Apply a light gauze dressing if there is drainage and monitor for signs of infection, contacting the surgeon if drainage persists for more than three days.  Follow-up   She requires follow-up for anemia, wound care, and mental health. Blood samples are sent to [facility] for timely results due to equipment issues at the current lab. Follow up with the surgeon's PA on November 06, 2023, virtually, and with the mental health provider virtually. Consider earlier follow-up with a hematologist if anemia is severe.   Return if symptoms worsen or fail  to improve.  Ellsworth Haas, MD

## 2023-10-30 ENCOUNTER — Encounter: Payer: Self-pay | Admitting: Obstetrics & Gynecology

## 2023-10-30 ENCOUNTER — Ambulatory Visit: Payer: Self-pay | Admitting: Family Medicine

## 2023-10-30 DIAGNOSIS — F332 Major depressive disorder, recurrent severe without psychotic features: Secondary | ICD-10-CM | POA: Diagnosis not present

## 2023-10-30 DIAGNOSIS — F411 Generalized anxiety disorder: Secondary | ICD-10-CM | POA: Diagnosis not present

## 2023-10-30 DIAGNOSIS — F4312 Post-traumatic stress disorder, chronic: Secondary | ICD-10-CM | POA: Diagnosis not present

## 2023-10-30 LAB — COMPREHENSIVE METABOLIC PANEL WITH GFR
AG Ratio: 1.4 (calc) (ref 1.0–2.5)
ALT: 17 U/L (ref 6–29)
AST: 18 U/L (ref 10–30)
Albumin: 4.3 g/dL (ref 3.6–5.1)
Alkaline phosphatase (APISO): 46 U/L (ref 31–125)
BUN: 8 mg/dL (ref 7–25)
CO2: 28 mmol/L (ref 20–32)
Calcium: 9.4 mg/dL (ref 8.6–10.2)
Chloride: 101 mmol/L (ref 98–110)
Creat: 0.54 mg/dL (ref 0.50–0.96)
Globulin: 3 g/dL (ref 1.9–3.7)
Glucose, Bld: 76 mg/dL (ref 65–99)
Potassium: 4.4 mmol/L (ref 3.5–5.3)
Sodium: 138 mmol/L (ref 135–146)
Total Bilirubin: 0.5 mg/dL (ref 0.2–1.2)
Total Protein: 7.3 g/dL (ref 6.1–8.1)
eGFR: 130 mL/min/{1.73_m2} (ref >=60)

## 2023-10-30 LAB — CBC WITH DIFFERENTIAL/PLATELET
Absolute Lymphocytes: 1909 {cells}/uL (ref 850–3900)
Absolute Monocytes: 672 {cells}/uL (ref 200–950)
Basophils Absolute: 42 {cells}/uL (ref 0–200)
Basophils Relative: 0.5 %
Eosinophils Absolute: 66 {cells}/uL (ref 15–500)
Eosinophils Relative: 0.8 %
HCT: 27.4 % — ABNORMAL LOW (ref 35.0–45.0)
Hemoglobin: 9.2 g/dL — ABNORMAL LOW (ref 11.7–15.5)
MCH: 30.6 pg (ref 27.0–33.0)
MCHC: 33.6 g/dL (ref 32.0–36.0)
MCV: 91 fL (ref 80.0–100.0)
MPV: 9.9 fL (ref 7.5–12.5)
Monocytes Relative: 8.1 %
Neutro Abs: 5611 {cells}/uL (ref 1500–7800)
Neutrophils Relative %: 67.6 %
Platelets: 375 10*3/uL (ref 140–400)
RBC: 3.01 10*6/uL — ABNORMAL LOW (ref 3.80–5.10)
RDW: 11.8 % (ref 11.0–15.0)
Total Lymphocyte: 23 %
WBC: 8.3 10*3/uL (ref 3.8–10.8)

## 2023-10-30 LAB — IRON,TIBC AND FERRITIN PANEL
%SAT: 9 % — ABNORMAL LOW (ref 16–45)
Ferritin: 157 ng/mL — ABNORMAL HIGH (ref 16–154)
Iron: 27 ug/dL — ABNORMAL LOW (ref 40–190)
TIBC: 294 ug/dL (ref 250–450)

## 2023-10-30 LAB — VITAMIN B12: Vitamin B-12: 404 pg/mL (ref 200–1100)

## 2023-10-30 NOTE — Progress Notes (Signed)
 Hgb has improved from hospital.  Iron is low.  The ferritin is not but suspect d/t inflammation/healing from surgery.  Can try taking liquid iron or she can forward to hematology

## 2023-10-31 ENCOUNTER — Encounter: Admitting: Physical Therapy

## 2023-10-31 ENCOUNTER — Other Ambulatory Visit: Payer: Self-pay | Admitting: Obstetrics & Gynecology

## 2023-10-31 DIAGNOSIS — Z96641 Presence of right artificial hip joint: Secondary | ICD-10-CM | POA: Diagnosis not present

## 2023-10-31 DIAGNOSIS — K219 Gastro-esophageal reflux disease without esophagitis: Secondary | ICD-10-CM | POA: Diagnosis not present

## 2023-10-31 DIAGNOSIS — K3184 Gastroparesis: Secondary | ICD-10-CM | POA: Diagnosis not present

## 2023-10-31 DIAGNOSIS — Q796 Ehlers-Danlos syndrome, unspecified: Secondary | ICD-10-CM | POA: Diagnosis not present

## 2023-10-31 DIAGNOSIS — D62 Acute posthemorrhagic anemia: Secondary | ICD-10-CM | POA: Diagnosis not present

## 2023-10-31 DIAGNOSIS — I872 Venous insufficiency (chronic) (peripheral): Secondary | ICD-10-CM | POA: Diagnosis not present

## 2023-10-31 DIAGNOSIS — Z604 Social exclusion and rejection: Secondary | ICD-10-CM | POA: Diagnosis not present

## 2023-10-31 DIAGNOSIS — Z79891 Long term (current) use of opiate analgesic: Secondary | ICD-10-CM | POA: Diagnosis not present

## 2023-10-31 DIAGNOSIS — F909 Attention-deficit hyperactivity disorder, unspecified type: Secondary | ICD-10-CM | POA: Diagnosis not present

## 2023-10-31 DIAGNOSIS — D72829 Elevated white blood cell count, unspecified: Secondary | ICD-10-CM | POA: Diagnosis not present

## 2023-10-31 DIAGNOSIS — F419 Anxiety disorder, unspecified: Secondary | ICD-10-CM | POA: Diagnosis not present

## 2023-10-31 DIAGNOSIS — F4310 Post-traumatic stress disorder, unspecified: Secondary | ICD-10-CM | POA: Diagnosis not present

## 2023-10-31 DIAGNOSIS — Z7982 Long term (current) use of aspirin: Secondary | ICD-10-CM | POA: Diagnosis not present

## 2023-10-31 DIAGNOSIS — Z471 Aftercare following joint replacement surgery: Secondary | ICD-10-CM | POA: Diagnosis not present

## 2023-10-31 DIAGNOSIS — J45990 Exercise induced bronchospasm: Secondary | ICD-10-CM | POA: Diagnosis not present

## 2023-10-31 DIAGNOSIS — I951 Orthostatic hypotension: Secondary | ICD-10-CM | POA: Diagnosis not present

## 2023-10-31 DIAGNOSIS — T839XXA Unspecified complication of genitourinary prosthetic device, implant and graft, initial encounter: Secondary | ICD-10-CM

## 2023-10-31 DIAGNOSIS — F3342 Major depressive disorder, recurrent, in full remission: Secondary | ICD-10-CM | POA: Diagnosis not present

## 2023-10-31 DIAGNOSIS — Z87891 Personal history of nicotine dependence: Secondary | ICD-10-CM | POA: Diagnosis not present

## 2023-10-31 DIAGNOSIS — G43909 Migraine, unspecified, not intractable, without status migrainosus: Secondary | ICD-10-CM | POA: Diagnosis not present

## 2023-10-31 DIAGNOSIS — L739 Follicular disorder, unspecified: Secondary | ICD-10-CM | POA: Diagnosis not present

## 2023-10-31 DIAGNOSIS — K581 Irritable bowel syndrome with constipation: Secondary | ICD-10-CM | POA: Diagnosis not present

## 2023-10-31 NOTE — Progress Notes (Signed)
 Pt had x ray at [facility] and was told her IUD may be malpositioned.  Getting TVUS.

## 2023-11-01 DIAGNOSIS — Z7982 Long term (current) use of aspirin: Secondary | ICD-10-CM | POA: Diagnosis not present

## 2023-11-01 DIAGNOSIS — Z604 Social exclusion and rejection: Secondary | ICD-10-CM | POA: Diagnosis not present

## 2023-11-01 DIAGNOSIS — G43909 Migraine, unspecified, not intractable, without status migrainosus: Secondary | ICD-10-CM | POA: Diagnosis not present

## 2023-11-01 DIAGNOSIS — K3184 Gastroparesis: Secondary | ICD-10-CM | POA: Diagnosis not present

## 2023-11-01 DIAGNOSIS — Z79891 Long term (current) use of opiate analgesic: Secondary | ICD-10-CM | POA: Diagnosis not present

## 2023-11-01 DIAGNOSIS — Z96641 Presence of right artificial hip joint: Secondary | ICD-10-CM | POA: Diagnosis not present

## 2023-11-01 DIAGNOSIS — D62 Acute posthemorrhagic anemia: Secondary | ICD-10-CM | POA: Diagnosis not present

## 2023-11-01 DIAGNOSIS — F3342 Major depressive disorder, recurrent, in full remission: Secondary | ICD-10-CM | POA: Diagnosis not present

## 2023-11-01 DIAGNOSIS — J45990 Exercise induced bronchospasm: Secondary | ICD-10-CM | POA: Diagnosis not present

## 2023-11-01 DIAGNOSIS — D72829 Elevated white blood cell count, unspecified: Secondary | ICD-10-CM | POA: Diagnosis not present

## 2023-11-01 DIAGNOSIS — F909 Attention-deficit hyperactivity disorder, unspecified type: Secondary | ICD-10-CM | POA: Diagnosis not present

## 2023-11-01 DIAGNOSIS — F419 Anxiety disorder, unspecified: Secondary | ICD-10-CM | POA: Diagnosis not present

## 2023-11-01 DIAGNOSIS — K581 Irritable bowel syndrome with constipation: Secondary | ICD-10-CM | POA: Diagnosis not present

## 2023-11-01 DIAGNOSIS — I872 Venous insufficiency (chronic) (peripheral): Secondary | ICD-10-CM | POA: Diagnosis not present

## 2023-11-01 DIAGNOSIS — Z87891 Personal history of nicotine dependence: Secondary | ICD-10-CM | POA: Diagnosis not present

## 2023-11-01 DIAGNOSIS — F4310 Post-traumatic stress disorder, unspecified: Secondary | ICD-10-CM | POA: Diagnosis not present

## 2023-11-01 DIAGNOSIS — L739 Follicular disorder, unspecified: Secondary | ICD-10-CM | POA: Diagnosis not present

## 2023-11-01 DIAGNOSIS — Q796 Ehlers-Danlos syndrome, unspecified: Secondary | ICD-10-CM | POA: Diagnosis not present

## 2023-11-01 DIAGNOSIS — K219 Gastro-esophageal reflux disease without esophagitis: Secondary | ICD-10-CM | POA: Diagnosis not present

## 2023-11-01 DIAGNOSIS — F332 Major depressive disorder, recurrent severe without psychotic features: Secondary | ICD-10-CM | POA: Diagnosis not present

## 2023-11-01 DIAGNOSIS — Z471 Aftercare following joint replacement surgery: Secondary | ICD-10-CM | POA: Diagnosis not present

## 2023-11-01 DIAGNOSIS — I951 Orthostatic hypotension: Secondary | ICD-10-CM | POA: Diagnosis not present

## 2023-11-02 DIAGNOSIS — D509 Iron deficiency anemia, unspecified: Secondary | ICD-10-CM | POA: Insufficient documentation

## 2023-11-02 DIAGNOSIS — F332 Major depressive disorder, recurrent severe without psychotic features: Secondary | ICD-10-CM | POA: Diagnosis not present

## 2023-11-05 ENCOUNTER — Ambulatory Visit: Admitting: Physical Therapy

## 2023-11-05 DIAGNOSIS — Q796 Ehlers-Danlos syndrome, unspecified: Secondary | ICD-10-CM | POA: Diagnosis not present

## 2023-11-05 DIAGNOSIS — Z79891 Long term (current) use of opiate analgesic: Secondary | ICD-10-CM | POA: Diagnosis not present

## 2023-11-05 DIAGNOSIS — Z87891 Personal history of nicotine dependence: Secondary | ICD-10-CM | POA: Diagnosis not present

## 2023-11-05 DIAGNOSIS — K219 Gastro-esophageal reflux disease without esophagitis: Secondary | ICD-10-CM | POA: Diagnosis not present

## 2023-11-05 DIAGNOSIS — Z471 Aftercare following joint replacement surgery: Secondary | ICD-10-CM | POA: Diagnosis not present

## 2023-11-05 DIAGNOSIS — Z7982 Long term (current) use of aspirin: Secondary | ICD-10-CM | POA: Diagnosis not present

## 2023-11-05 DIAGNOSIS — Z96641 Presence of right artificial hip joint: Secondary | ICD-10-CM | POA: Diagnosis not present

## 2023-11-05 DIAGNOSIS — F419 Anxiety disorder, unspecified: Secondary | ICD-10-CM | POA: Diagnosis not present

## 2023-11-05 DIAGNOSIS — I872 Venous insufficiency (chronic) (peripheral): Secondary | ICD-10-CM | POA: Diagnosis not present

## 2023-11-05 DIAGNOSIS — L739 Follicular disorder, unspecified: Secondary | ICD-10-CM | POA: Diagnosis not present

## 2023-11-05 DIAGNOSIS — K581 Irritable bowel syndrome with constipation: Secondary | ICD-10-CM | POA: Diagnosis not present

## 2023-11-05 DIAGNOSIS — F4310 Post-traumatic stress disorder, unspecified: Secondary | ICD-10-CM | POA: Diagnosis not present

## 2023-11-05 DIAGNOSIS — D62 Acute posthemorrhagic anemia: Secondary | ICD-10-CM | POA: Diagnosis not present

## 2023-11-05 DIAGNOSIS — F3342 Major depressive disorder, recurrent, in full remission: Secondary | ICD-10-CM | POA: Diagnosis not present

## 2023-11-05 DIAGNOSIS — I951 Orthostatic hypotension: Secondary | ICD-10-CM | POA: Diagnosis not present

## 2023-11-05 DIAGNOSIS — K3184 Gastroparesis: Secondary | ICD-10-CM | POA: Diagnosis not present

## 2023-11-05 DIAGNOSIS — D72829 Elevated white blood cell count, unspecified: Secondary | ICD-10-CM | POA: Diagnosis not present

## 2023-11-05 DIAGNOSIS — G43909 Migraine, unspecified, not intractable, without status migrainosus: Secondary | ICD-10-CM | POA: Diagnosis not present

## 2023-11-05 DIAGNOSIS — F909 Attention-deficit hyperactivity disorder, unspecified type: Secondary | ICD-10-CM | POA: Diagnosis not present

## 2023-11-05 DIAGNOSIS — Z604 Social exclusion and rejection: Secondary | ICD-10-CM | POA: Diagnosis not present

## 2023-11-05 DIAGNOSIS — J45990 Exercise induced bronchospasm: Secondary | ICD-10-CM | POA: Diagnosis not present

## 2023-11-06 DIAGNOSIS — F332 Major depressive disorder, recurrent severe without psychotic features: Secondary | ICD-10-CM | POA: Diagnosis not present

## 2023-11-06 DIAGNOSIS — F411 Generalized anxiety disorder: Secondary | ICD-10-CM | POA: Diagnosis not present

## 2023-11-06 DIAGNOSIS — F4312 Post-traumatic stress disorder, chronic: Secondary | ICD-10-CM | POA: Diagnosis not present

## 2023-11-07 ENCOUNTER — Encounter: Admitting: Physical Therapy

## 2023-11-07 DIAGNOSIS — Z79891 Long term (current) use of opiate analgesic: Secondary | ICD-10-CM | POA: Diagnosis not present

## 2023-11-07 DIAGNOSIS — Z87891 Personal history of nicotine dependence: Secondary | ICD-10-CM | POA: Diagnosis not present

## 2023-11-07 DIAGNOSIS — Z604 Social exclusion and rejection: Secondary | ICD-10-CM | POA: Diagnosis not present

## 2023-11-07 DIAGNOSIS — D62 Acute posthemorrhagic anemia: Secondary | ICD-10-CM | POA: Diagnosis not present

## 2023-11-07 DIAGNOSIS — F4310 Post-traumatic stress disorder, unspecified: Secondary | ICD-10-CM | POA: Diagnosis not present

## 2023-11-07 DIAGNOSIS — D72829 Elevated white blood cell count, unspecified: Secondary | ICD-10-CM | POA: Diagnosis not present

## 2023-11-07 DIAGNOSIS — L739 Follicular disorder, unspecified: Secondary | ICD-10-CM | POA: Diagnosis not present

## 2023-11-07 DIAGNOSIS — Q796 Ehlers-Danlos syndrome, unspecified: Secondary | ICD-10-CM | POA: Diagnosis not present

## 2023-11-07 DIAGNOSIS — K581 Irritable bowel syndrome with constipation: Secondary | ICD-10-CM | POA: Diagnosis not present

## 2023-11-07 DIAGNOSIS — I951 Orthostatic hypotension: Secondary | ICD-10-CM | POA: Diagnosis not present

## 2023-11-07 DIAGNOSIS — J45990 Exercise induced bronchospasm: Secondary | ICD-10-CM | POA: Diagnosis not present

## 2023-11-07 DIAGNOSIS — Z96641 Presence of right artificial hip joint: Secondary | ICD-10-CM | POA: Diagnosis not present

## 2023-11-07 DIAGNOSIS — D500 Iron deficiency anemia secondary to blood loss (chronic): Secondary | ICD-10-CM | POA: Diagnosis not present

## 2023-11-07 DIAGNOSIS — F909 Attention-deficit hyperactivity disorder, unspecified type: Secondary | ICD-10-CM | POA: Diagnosis not present

## 2023-11-07 DIAGNOSIS — F419 Anxiety disorder, unspecified: Secondary | ICD-10-CM | POA: Diagnosis not present

## 2023-11-07 DIAGNOSIS — K3184 Gastroparesis: Secondary | ICD-10-CM | POA: Diagnosis not present

## 2023-11-07 DIAGNOSIS — K219 Gastro-esophageal reflux disease without esophagitis: Secondary | ICD-10-CM | POA: Diagnosis not present

## 2023-11-07 DIAGNOSIS — G43909 Migraine, unspecified, not intractable, without status migrainosus: Secondary | ICD-10-CM | POA: Diagnosis not present

## 2023-11-07 DIAGNOSIS — Z471 Aftercare following joint replacement surgery: Secondary | ICD-10-CM | POA: Diagnosis not present

## 2023-11-07 DIAGNOSIS — I872 Venous insufficiency (chronic) (peripheral): Secondary | ICD-10-CM | POA: Diagnosis not present

## 2023-11-07 DIAGNOSIS — F3342 Major depressive disorder, recurrent, in full remission: Secondary | ICD-10-CM | POA: Diagnosis not present

## 2023-11-07 DIAGNOSIS — Z7982 Long term (current) use of aspirin: Secondary | ICD-10-CM | POA: Diagnosis not present

## 2023-11-08 ENCOUNTER — Telehealth: Payer: Self-pay | Admitting: Family Medicine

## 2023-11-08 DIAGNOSIS — T801XXA Vascular complications following infusion, transfusion and therapeutic injection, initial encounter: Secondary | ICD-10-CM | POA: Diagnosis not present

## 2023-11-08 NOTE — Telephone Encounter (Signed)
 Form placed on providers desk

## 2023-11-08 NOTE — Telephone Encounter (Signed)
 Received faxed document Home Health Certificate (Order ID [id number] ), to be filled out by provider. Patient requested to send it back via Fax . Document is located in providers tray at front office.Please advise

## 2023-11-09 DIAGNOSIS — J45990 Exercise induced bronchospasm: Secondary | ICD-10-CM

## 2023-11-09 DIAGNOSIS — I951 Orthostatic hypotension: Secondary | ICD-10-CM

## 2023-11-09 DIAGNOSIS — K3184 Gastroparesis: Secondary | ICD-10-CM

## 2023-11-09 DIAGNOSIS — L739 Follicular disorder, unspecified: Secondary | ICD-10-CM

## 2023-11-09 DIAGNOSIS — D62 Acute posthemorrhagic anemia: Secondary | ICD-10-CM | POA: Diagnosis not present

## 2023-11-09 DIAGNOSIS — I872 Venous insufficiency (chronic) (peripheral): Secondary | ICD-10-CM | POA: Diagnosis not present

## 2023-11-09 DIAGNOSIS — K581 Irritable bowel syndrome with constipation: Secondary | ICD-10-CM

## 2023-11-09 DIAGNOSIS — D72829 Elevated white blood cell count, unspecified: Secondary | ICD-10-CM

## 2023-11-09 DIAGNOSIS — F3342 Major depressive disorder, recurrent, in full remission: Secondary | ICD-10-CM | POA: Diagnosis not present

## 2023-11-09 DIAGNOSIS — F332 Major depressive disorder, recurrent severe without psychotic features: Secondary | ICD-10-CM | POA: Diagnosis not present

## 2023-11-09 DIAGNOSIS — Q796 Ehlers-Danlos syndrome, unspecified: Secondary | ICD-10-CM

## 2023-11-09 DIAGNOSIS — Z471 Aftercare following joint replacement surgery: Secondary | ICD-10-CM | POA: Diagnosis not present

## 2023-11-09 DIAGNOSIS — K219 Gastro-esophageal reflux disease without esophagitis: Secondary | ICD-10-CM

## 2023-11-09 NOTE — Telephone Encounter (Signed)
 Form faxed back.

## 2023-11-10 ENCOUNTER — Ambulatory Visit (HOSPITAL_COMMUNITY): Payer: Self-pay

## 2023-11-10 ENCOUNTER — Ambulatory Visit
Admission: RE | Admit: 2023-11-10 | Discharge: 2023-11-10 | Disposition: A | Discharge status: Home / Self Care | Payer: Self-pay | Source: Ambulatory Visit | Attending: Family Medicine

## 2023-11-10 VITALS — BP 106/77 | HR 96 | Temp 98.9°F | Resp 16

## 2023-11-10 DIAGNOSIS — Z5189 Encounter for other specified aftercare: Secondary | ICD-10-CM

## 2023-11-10 NOTE — Discharge Instructions (Signed)
 The incision on the right hip is healing appropriately.  There is no signs of infection.  Would leave the remaining 2 Steri-Strips in place and follow the instructions for leaving open to air as given by the surgeon today.  Return to urgent care if needed.

## 2023-11-10 NOTE — ED Triage Notes (Addendum)
 Pt concerned with right hip area after removing steri strip x 4 yesterday-pt had right hip surgery 6/2-states she was advised not to remove steri strips-NAD-walking with crutches

## 2023-11-10 NOTE — ED Provider Notes (Signed)
 [facility]    CSN: [id number] Arrival date & time: 11/10/23  1201      History   Chief Complaint Chief Complaint  Patient presents with   Wound Check    Hip replacement surgery was 6/2, I (patient) removed a few steri strips today, concerned about open wound - Entered by patient    HPI Brianna [name] is a 28 y.o. female.   28 year old female who presents to urgent care for surgical wound check.  The patient is status post right hip replacement on 6/2 at [facility].  She reports that she removed several of the Steri-Strips but was not supposed to.  She was concerned that the wound might not heal appropriately due to this.  She did message her surgeon about this who responded while she was here stating for her to leave the incision open to air.  She denies any fevers or chills.  She is using crutches for ambulation.   Wound Check Pertinent negatives include no chest pain, no abdominal pain and no shortness of breath.    Past Medical History:  Diagnosis Date   Acute recurrent pansinusitis 10/21/2019   ADHD (attention deficit hyperactivity disorder)    inattentive   Allergy    Anemia    had iron infusions in Fall of 2023   Anxiety    Arthritis    Joints, hips   Asthma    EIA   Bronchitis    Cat allergy due to both airborne and skin contact 05/01/2016   Chronic sore throat 05/01/2016   COVID 2020   Depression    Ehlers-Danlos syndrome    Fibromyalgia    pt denies   Gastroparesis    GERD (gastroesophageal reflux disease)    History of hypotension    taken midodrine  in the past- Spring of 2022   Hypermobile Ehlers-Danlos syndrome    Irregular heart rate    Keratosis pilaris    Migraine    w and w/o auras   PCOS (polycystic ovarian syndrome) 10/26/2022   PONV (postoperative nausea and vomiting)    I can get really cold   PTSD (post-traumatic stress disorder)    Seasonal allergic rhinitis 03/11/2014    Patient Active Problem List    Diagnosis Date Noted   Chronic night sweats 08/21/2023   PCOS (polycystic ovarian syndrome) 10/26/2022   Cervical adenopathy 06/10/2021   Chronic pruritus 06/10/2021   Exercise-induced asthma 06/09/2021   Chronic venous insufficiency 06/03/2021   Orthostatic lightheadedness 06/03/2021   Purple toe syndrome of both feet (HCC) 06/03/2021   Autonomic dysfunction 06/03/2021   Tear of right acetabular labrum 04/13/2021   Migraine headache 03/01/2021   Headache 11/30/2020   Positive ANA (antinuclear antibody) 11/15/2020   Fibromyalgia 11/02/2020   Hair loss 11/02/2020   Muscle spasticity 10/22/2020   Hypermobile Ehlers-Danlos syndrome 10/22/2020   Paresthesia of bilateral legs 10/06/2020   Myalgia 10/06/2020   Chronic pelvic pain in female 10/06/2020   Chronic abdominal pain 10/06/2020   Irritable colon 08/02/2020   Pelvic floor dysfunction 08/02/2020   Irritant dermatitis 08/02/2020   Gastroesophageal reflux disease 06/28/2020   Severe recurrent major depression without psychotic features (HCC) 06/28/2020   Polyarthralgia 06/28/2020   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 06/28/2020   Tachycardia 06/28/2020   History of COVID-19 06/28/2020   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 deficit 06/28/2020   Recurrent major depressive disorder, in full remission (HCC) 06/28/2020   Atypical chest pain 06/22/2020   Dizziness 06/22/2020  Palpitations 06/14/2020   Anxiety 05/04/2020   Internal and external hemorrhoids without complication 04/29/2020   Nondiabetic gastroparesis 03/03/2020   ETD (Eustachian tube dysfunction), bilateral 10/21/2019   Acne vulgaris 03/11/2014   Mild intermittent asthma without complication 03/11/2014   Seasonal allergies 03/11/2014    Past Surgical History:  Procedure Laterality Date   CHOLECYSTECTOMY  05/09/2021   ESOPHAGOGASTRODUODENOSCOPY ENDOSCOPY     03/2020   HIP ARTHROPLASTY Right    HIP ARTHROSCOPY Right  04/25/2022   Procedure: RIGHT HIP ARTHROSCOPY WITH LABRAL RECONSTRUCTION, ILIOTIBIAL BAND ALLOGRAFT, PSOAS RELEASE;  Surgeon: Genelle Standing, MD;  Location: [facility] OR;  Service: Orthopedics;  Laterality: Right;   HIP SURGERY Right    per pt   JOINT REPLACEMENT     Upcoming THR right hip 09/03/23   LABRAL REPAIR Right 03/02/2021   Procedure: RIGHT HIP ARTHROSCOPY WITH LABRAL REPAIR AND PINCE DEBRIDEMENT;  Surgeon: Genelle Standing, MD;  Location: [facility] OR;  Service: Orthopedics;  Laterality: Right;   SMART PILL PROCEDURE     12/2020   TYMPANOSTOMY TUBE PLACEMENT Bilateral    WISDOM TOOTH EXTRACTION      OB History     Gravida  1   Para  0   Term      Preterm  0   AB  1   Living  0      SAB      IAB  1   Ectopic  0   Multiple      Live Births  0            Home Medications    Prior to Admission medications   Medication Sig Start Date End Date Taking? Authorizing Provider  clonazePAM (KLONOPIN) 1 MG tablet Take 1 mg by mouth daily as needed. 06/18/23   [provider]  colestipol (COLESTID) 1 g tablet Take by mouth 2 (two) times daily. 07/03/23   [provider]  HYDROmorphone  HCl (DILAUDID ) 1 MG/ML LIQD Take by mouth.    [provider]  levonorgestrel  (MIRENA ) 20 MCG/DAY IUD 1 each by Intrauterine route once.    [provider]  loratadine  (CLARITIN ) 10 MG tablet Take 10 mg by mouth at bedtime. 06/20/23 06/19/24  [provider]  ondansetron  (ZOFRAN  ODT) 4 MG disintegrating tablet Take 1 tablet (4 mg total) by mouth every 8 (eight) hours as needed for nausea or vomiting. 11/30/20   Skeet Juliene SAUNDERS, DO  prochlorperazine (COMPAZINE) 10 MG tablet  10/03/22   [provider]  promethazine  (PHENERGAN ) 25 MG tablet Take 1 tablet (25 mg total) by mouth every 6 (six) hours as needed for nausea or vomiting. 08/28/23   Wendolyn Jenkins Jansky, MD  Psyllium (GERI-MUCIL) 25 % POWD Take by mouth daily as needed. 2 tsp    [provider]   triamcinolone  cream (KENALOG ) 0.1 % Apply 1 Application topically daily as needed (Eczma).    [provider]  Vilazodone HCl (VIIBRYD) 40 MG TABS Take 40 mg by mouth daily. 04/22/23   [provider]    Family History Family History  Problem Relation Age of Onset   Diabetes Mother    Polycystic ovary syndrome Mother    Anxiety disorder Mother    Arthritis Mother    Kidney disease Mother    Obesity Mother    GER disease Father    Arthritis Father    Hypertension Father    ADD / ADHD Brother    Obesity Brother    Heart disease  Paternal Uncle    COPD Maternal Grandmother    Cancer Paternal Aunt     Social History Social History   Tobacco Use   Smoking status: Former    Current packs/day: 0.00    Types: Cigarettes, E-cigarettes    Quit date: 2021    Years since quitting: 4.4   Smokeless tobacco: Never   Tobacco comments:    Have not used in 4 years  Vaping Use   Vaping status: Former   Substances: Financial trader  Substance Use Topics   Alcohol use: Not Currently   Drug use: Not Currently    Types: Marijuana     Allergies   Clindamycin, Molds & smuts, Tretinoin, Amoxicillin , and Famotidine   Review of Systems Review of Systems  Constitutional:  Negative for chills and fever.  HENT:  Negative for ear pain and sore throat.   Eyes:  Negative for pain and visual disturbance.  Respiratory:  Negative for cough and shortness of breath.   Cardiovascular:  Negative for chest pain and palpitations.  Gastrointestinal:  Negative for abdominal pain and vomiting.  Genitourinary:  Negative for dysuria and hematuria.  Musculoskeletal:  Negative for arthralgias and back pain.  Skin:  Negative for color change and rash.       Incision along right anterior lateral thigh  Neurological:  Negative for seizures and syncope.  All other systems reviewed and are negative.    Physical Exam Triage Vital Signs ED Triage Vitals  Encounter Vitals Group      BP 11/10/23 1223 106/77     Girls Systolic BP Percentile --      Girls Diastolic BP Percentile --      Boys Systolic BP Percentile --      Boys Diastolic BP Percentile --      Pulse Rate 11/10/23 1223 96     Resp 11/10/23 1223 16     Temp 11/10/23 1223 98.9 F (37.2 C)     Temp Source 11/10/23 1223 Oral     SpO2 11/10/23 1223 98 %     Weight --      Height --      Head Circumference --      Peak Flow --      Pain Score 11/10/23 1221 5     Pain Loc --      Pain Education --      Exclude from Growth Chart --    No data found.  Updated Vital Signs BP 106/77 (BP Location: Right Arm)   Pulse 96   Temp 98.9 F (37.2 C) (Oral)   Resp 16   SpO2 98%   Visual Acuity Right Eye Distance:   Left Eye Distance:   Bilateral Distance:    Right Eye Near:   Left Eye Near:    Bilateral Near:     Physical Exam Vitals and nursing note reviewed.  Constitutional:      General: She is not in acute distress.    Appearance: She is well-developed.  HENT:     Head: Normocephalic and atraumatic.   Eyes:     Conjunctiva/sclera: Conjunctivae normal.    Cardiovascular:     Rate and Rhythm: Normal rate and regular rhythm.     Heart sounds: No murmur heard. Pulmonary:     Effort: Pulmonary effort is normal. No respiratory distress.     Breath sounds: Normal breath sounds.  Abdominal:     Palpations: Abdomen is soft.     Tenderness: There is  no abdominal tenderness.   Musculoskeletal:        General: No swelling.     Cervical back: Neck supple.   Skin:    General: Skin is warm and dry.     Capillary Refill: Capillary refill takes less than 2 seconds.       Neurological:     Mental Status: She is alert.   Psychiatric:        Mood and Affect: Mood normal.      UC Treatments / Results  Labs (all labs ordered are listed, but only abnormal results are displayed) Labs Reviewed - No data to display  EKG   Radiology No results found.  Procedures Procedures (including  critical care time)  Medications Ordered in UC Medications - No data to display  Initial Impression / Assessment and Plan / UC Course  I have reviewed the triage vital signs and the nursing notes.  Pertinent labs & imaging results that were available during my care of the patient were reviewed by me and considered in my medical decision making (see chart for details).     Visit for wound check   The surgical incision on the right anterior lateral thigh is healing appropriately.  It is clean, dry and intact.  There is no signs of infection.  Recommend leaving the remaining Steri-Strips in place and leave the incision open to air as directed by the surgeon.  Follow-up at urgent care as needed.  Final Clinical Impressions(s) / UC Diagnoses   Final diagnoses:  Visit for wound check     Discharge Instructions      The incision on the right hip is healing appropriately.  There is no signs of infection.  Would leave the remaining 2 Steri-Strips in place and follow the instructions for leaving open to air as given by the surgeon today.  Return to urgent care if needed.    ED Prescriptions   None    PDMP not reviewed this encounter.   Teresa Almarie LABOR, PA-C 11/10/23 1238

## 2023-11-12 ENCOUNTER — Encounter: Admitting: Physical Therapy

## 2023-11-12 ENCOUNTER — Ambulatory Visit: Admitting: Family

## 2023-11-12 ENCOUNTER — Ambulatory Visit: Admitting: Physical Therapy

## 2023-11-12 ENCOUNTER — Ambulatory Visit

## 2023-11-12 DIAGNOSIS — T839XXA Unspecified complication of genitourinary prosthetic device, implant and graft, initial encounter: Secondary | ICD-10-CM | POA: Diagnosis not present

## 2023-11-12 DIAGNOSIS — D509 Iron deficiency anemia, unspecified: Secondary | ICD-10-CM | POA: Diagnosis not present

## 2023-11-12 DIAGNOSIS — Z30430 Encounter for insertion of intrauterine contraceptive device: Secondary | ICD-10-CM | POA: Diagnosis not present

## 2023-11-12 DIAGNOSIS — E611 Iron deficiency: Secondary | ICD-10-CM | POA: Diagnosis not present

## 2023-11-13 ENCOUNTER — Other Ambulatory Visit (HOSPITAL_COMMUNITY)
Admission: RE | Admit: 2023-11-13 | Discharge: 2023-11-13 | Disposition: A | Discharge status: Home / Self Care | Source: Ambulatory Visit | Attending: Family | Admitting: Family

## 2023-11-13 ENCOUNTER — Ambulatory Visit (INDEPENDENT_AMBULATORY_CARE_PROVIDER_SITE_OTHER): Admitting: Family

## 2023-11-13 VITALS — BP 112/60 | HR 100 | Temp 97.7°F | Ht 65.0 in | Wt 124.0 lb

## 2023-11-13 DIAGNOSIS — F411 Generalized anxiety disorder: Secondary | ICD-10-CM | POA: Diagnosis not present

## 2023-11-13 DIAGNOSIS — F332 Major depressive disorder, recurrent severe without psychotic features: Secondary | ICD-10-CM | POA: Diagnosis not present

## 2023-11-13 DIAGNOSIS — Z113 Encounter for screening for infections with a predominantly sexual mode of transmission: Secondary | ICD-10-CM | POA: Diagnosis not present

## 2023-11-13 DIAGNOSIS — F4312 Post-traumatic stress disorder, chronic: Secondary | ICD-10-CM | POA: Diagnosis not present

## 2023-11-13 NOTE — Progress Notes (Signed)
 [name], female    DOB: 28-May-1995, 28 y.o.   MRN: [id number]  Chief Complaint  Patient presents with   Exposure to STD    Started Dating and just want testing to make sure everything is good  Discussed the use of AI scribe software for clinical note transcription with the patient, who gave verbal consent to proceed.  History of Present Illness Brianna [name] is a 28 year old female who presents for STD testing and follow-up after hip replacement surgery.  She seeks STD testing for chlamydia, gonorrhea, and trichomonas through urine, and considers additional testing for HIV and syphilis through blood. Her last HIV test was in 2023, with no history of STDs. She has no symptoms of BV or yeast infections.  Three weeks ago, she underwent a hip replacement due to undiagnosed hip dysplasia and now requires a walker for mobility. During an at-home nurse visit, she experienced an IV infiltration on her right arm after an iron infusion via home health nurse, causing skin hypersensitivity and tenderness, but it does not resemble cellulitis or infection, but there is a brown discoloring to the surrounding skin. She has bruising on her left arm from recent blood draw attempts.  Assessment & Plan Sexually transmitted infection (STI) screening Desires STI screening after new partner. Last HIV test in 2023. Discussed testing options and limitations of herpes blood test. - Perform urine test for chlamydia, gonorrhea, and trichomonas. - Perform blood test for HIV and syphilis. - Offer vaginal swab for comprehensive testing including BV and yeast infections if unable to provide enough urine. - Provide results through chart and send message once available.   Subjective:    Outpatient Medications Prior to Visit  Medication Sig Dispense Refill   clonazePAM (KLONOPIN) 1 MG tablet Take 1 mg by mouth daily as needed.     colestipol (COLESTID) 1 g tablet Take by mouth 2 (two)  times daily.     Dextromethorphan-buPROPion ER (AUVELITY) 45-105 MG TBCR Take by mouth.     levonorgestrel  (MIRENA ) 20 MCG/DAY IUD 1 each by Intrauterine route once.     loratadine  (CLARITIN ) 10 MG tablet Take 10 mg by mouth at bedtime.     ondansetron  (ZOFRAN  ODT) 4 MG disintegrating tablet Take 1 tablet (4 mg total) by mouth every 8 (eight) hours as needed for nausea or vomiting. 20 tablet 5   prochlorperazine (COMPAZINE) 10 MG tablet      promethazine  (PHENERGAN ) 25 MG tablet Take 1 tablet (25 mg total) by mouth every 6 (six) hours as needed for nausea or vomiting. 30 tablet 1   Psyllium (GERI-MUCIL) 25 % POWD Take by mouth daily as needed. 2 tsp     triamcinolone  cream (KENALOG ) 0.1 % Apply 1 Application topically daily as needed (Eczma).     Vilazodone HCl (VIIBRYD) 40 MG TABS Take 40 mg by mouth daily.     HYDROmorphone  HCl (DILAUDID ) 1 MG/ML LIQD Take by mouth. (Patient not taking: Reported on 11/13/2023)     No facility-administered medications prior to visit.   Past Medical History:  Diagnosis Date   Acute recurrent pansinusitis 10/21/2019   ADHD (attention deficit hyperactivity disorder)    inattentive   Allergy    Anemia    had iron infusions in Fall of 2023   Anxiety    Arthritis    Joints, hips   Asthma    EIA   Bronchitis    Cat allergy due to both airborne and skin contact 05/01/2016  Chronic sore throat 05/01/2016   COVID 2020   Depression    Ehlers-Danlos syndrome    Fibromyalgia    pt denies   Gastroparesis    GERD (gastroesophageal reflux disease)    History of hypotension    taken midodrine  in the past- Spring of 2022   Hypermobile Ehlers-Danlos syndrome    Irregular heart rate    Keratosis pilaris    Migraine    w and w/o auras   PCOS (polycystic ovarian syndrome) 10/26/2022   PONV (postoperative nausea and vomiting)    I can get really cold   PTSD (post-traumatic stress disorder)    Seasonal allergic rhinitis 03/11/2014   Past Surgical  History:  Procedure Laterality Date   CHOLECYSTECTOMY  05/09/2021   ESOPHAGOGASTRODUODENOSCOPY ENDOSCOPY     03/2020   HIP ARTHROPLASTY Right    HIP ARTHROSCOPY Right 04/25/2022   Procedure: RIGHT HIP ARTHROSCOPY WITH LABRAL RECONSTRUCTION, ILIOTIBIAL BAND ALLOGRAFT, PSOAS RELEASE;  Surgeon: Genelle Standing, MD;  Location: [facility] OR;  Service: Orthopedics;  Laterality: Right;   HIP SURGERY Right    per pt   JOINT REPLACEMENT     Upcoming THR right hip 09/03/23   LABRAL REPAIR Right 03/02/2021   Procedure: RIGHT HIP ARTHROSCOPY WITH LABRAL REPAIR AND PINCE DEBRIDEMENT;  Surgeon: Genelle Standing, MD;  Location: [facility] OR;  Service: Orthopedics;  Laterality: Right;   SMART PILL PROCEDURE     12/2020   TYMPANOSTOMY TUBE PLACEMENT Bilateral    WISDOM TOOTH EXTRACTION     Allergies  Allergen Reactions   Clindamycin Hives, Other (See Comments), Rash and Swelling    Other reaction(s): swelling   Molds & Smuts Other (See Comments), Hives and Itching    Other Reaction(s): Abdominal Pain   Tretinoin Dermatitis, Itching, Other (See Comments), Photosensitivity, Rash and Swelling   Amoxicillin  Diarrhea   Famotidine Diarrhea      Objective:    Physical Exam Vitals and nursing note reviewed.  Constitutional:      Appearance: Normal appearance.   Cardiovascular:     Rate and Rhythm: Normal rate and regular rhythm.  Pulmonary:     Effort: Pulmonary effort is normal.     Breath sounds: Normal breath sounds.   Musculoskeletal:        General: Normal range of motion.   Skin:    General: Skin is warm and dry.     Findings: Ecchymosis (light brown discoloration noted around right antecubital fossa, no erythema or swelling noted, tenderness with palpation, also mild ecchymosis noted on left antecubital) present.   Neurological:     Mental Status: She is alert.   Psychiatric:        Mood and Affect: Mood normal.        Behavior: Behavior normal.    BP 112/60   Pulse 100   Temp 97.7 F (36.5  C)   Ht 5' 5 (1.651 m)   Wt 124 lb (56.2 kg)   SpO2 99%   BMI 20.63 kg/m  Wt Readings from Last 3 Encounters:  11/13/23 124 lb (56.2 kg)  10/29/23 127 lb (57.6 kg)  08/20/23 120 lb 6 oz (54.6 kg)      Lucius Krabbe, NP

## 2023-11-14 ENCOUNTER — Ambulatory Visit: Admitting: Physical Therapy

## 2023-11-14 DIAGNOSIS — G43909 Migraine, unspecified, not intractable, without status migrainosus: Secondary | ICD-10-CM | POA: Diagnosis not present

## 2023-11-14 DIAGNOSIS — F4310 Post-traumatic stress disorder, unspecified: Secondary | ICD-10-CM | POA: Diagnosis not present

## 2023-11-14 DIAGNOSIS — K3184 Gastroparesis: Secondary | ICD-10-CM | POA: Diagnosis not present

## 2023-11-14 DIAGNOSIS — F3342 Major depressive disorder, recurrent, in full remission: Secondary | ICD-10-CM | POA: Diagnosis not present

## 2023-11-14 DIAGNOSIS — Q796 Ehlers-Danlos syndrome, unspecified: Secondary | ICD-10-CM | POA: Diagnosis not present

## 2023-11-14 DIAGNOSIS — K581 Irritable bowel syndrome with constipation: Secondary | ICD-10-CM | POA: Diagnosis not present

## 2023-11-14 DIAGNOSIS — Z96641 Presence of right artificial hip joint: Secondary | ICD-10-CM | POA: Diagnosis not present

## 2023-11-14 DIAGNOSIS — L739 Follicular disorder, unspecified: Secondary | ICD-10-CM | POA: Diagnosis not present

## 2023-11-14 DIAGNOSIS — Z7982 Long term (current) use of aspirin: Secondary | ICD-10-CM | POA: Diagnosis not present

## 2023-11-14 DIAGNOSIS — D500 Iron deficiency anemia secondary to blood loss (chronic): Secondary | ICD-10-CM | POA: Diagnosis not present

## 2023-11-14 DIAGNOSIS — F332 Major depressive disorder, recurrent severe without psychotic features: Secondary | ICD-10-CM | POA: Diagnosis not present

## 2023-11-14 DIAGNOSIS — F419 Anxiety disorder, unspecified: Secondary | ICD-10-CM | POA: Diagnosis not present

## 2023-11-14 DIAGNOSIS — K219 Gastro-esophageal reflux disease without esophagitis: Secondary | ICD-10-CM | POA: Diagnosis not present

## 2023-11-14 DIAGNOSIS — J45990 Exercise induced bronchospasm: Secondary | ICD-10-CM | POA: Diagnosis not present

## 2023-11-14 DIAGNOSIS — Z87891 Personal history of nicotine dependence: Secondary | ICD-10-CM | POA: Diagnosis not present

## 2023-11-14 DIAGNOSIS — I951 Orthostatic hypotension: Secondary | ICD-10-CM | POA: Diagnosis not present

## 2023-11-14 DIAGNOSIS — F909 Attention-deficit hyperactivity disorder, unspecified type: Secondary | ICD-10-CM | POA: Diagnosis not present

## 2023-11-14 DIAGNOSIS — Z604 Social exclusion and rejection: Secondary | ICD-10-CM | POA: Diagnosis not present

## 2023-11-14 DIAGNOSIS — I872 Venous insufficiency (chronic) (peripheral): Secondary | ICD-10-CM | POA: Diagnosis not present

## 2023-11-14 DIAGNOSIS — Z79891 Long term (current) use of opiate analgesic: Secondary | ICD-10-CM | POA: Diagnosis not present

## 2023-11-14 DIAGNOSIS — D62 Acute posthemorrhagic anemia: Secondary | ICD-10-CM | POA: Diagnosis not present

## 2023-11-14 DIAGNOSIS — Z471 Aftercare following joint replacement surgery: Secondary | ICD-10-CM | POA: Diagnosis not present

## 2023-11-14 DIAGNOSIS — D72829 Elevated white blood cell count, unspecified: Secondary | ICD-10-CM | POA: Diagnosis not present

## 2023-11-14 LAB — RPR: RPR Ser Ql: NONREACTIVE

## 2023-11-14 LAB — HIV ANTIBODY (ROUTINE TESTING W REFLEX): HIV 1&2 Ab, 4th Generation: NONREACTIVE

## 2023-11-14 LAB — URINE CYTOLOGY ANCILLARY ONLY
Chlamydia: NEGATIVE
Comment: NEGATIVE
Comment: NEGATIVE
Comment: NORMAL
Neisseria Gonorrhea: NEGATIVE
Trichomonas: NEGATIVE

## 2023-11-16 ENCOUNTER — Ambulatory Visit: Payer: Self-pay | Admitting: Family

## 2023-11-16 DIAGNOSIS — F332 Major depressive disorder, recurrent severe without psychotic features: Secondary | ICD-10-CM | POA: Diagnosis not present

## 2023-11-19 ENCOUNTER — Ambulatory Visit: Payer: Self-pay | Admitting: Obstetrics & Gynecology

## 2023-11-19 DIAGNOSIS — F332 Major depressive disorder, recurrent severe without psychotic features: Secondary | ICD-10-CM | POA: Diagnosis not present

## 2023-11-19 DIAGNOSIS — D509 Iron deficiency anemia, unspecified: Secondary | ICD-10-CM | POA: Diagnosis not present

## 2023-11-19 DIAGNOSIS — E611 Iron deficiency: Secondary | ICD-10-CM | POA: Diagnosis not present

## 2023-11-19 NOTE — Therapy (Unsigned)
 OUTPATIENT PHYSICAL THERAPY LOWER EXTREMITY EVALUATION   Patient Name: Brianna [name] MRN: [id number] DOB:[birth year], 28 y.o., female Today's Date: 11/20/2023  END OF SESSION:  PT End of Session - 11/20/23 1240     Visit Number 1    Date for PT Re-Evaluation 01/15/24    Authorization Type [employer]    PT Start Time 1238    PT Stop Time 1316    PT Time Calculation (min) 38 min    Activity Tolerance Patient tolerated treatment well    Behavior During Therapy [facility] for tasks assessed/performed          Past Medical History:  Diagnosis Date   Acute recurrent pansinusitis 10/21/2019   ADHD (attention deficit hyperactivity disorder)    inattentive   Allergy    Anemia    had iron infusions in Fall of 2023   Anxiety    Arthritis    Joints, hips   Asthma    EIA   Bronchitis    Cat allergy due to both airborne and skin contact 05/01/2016   Chronic sore throat 05/01/2016   COVID 2020   Depression    Ehlers-Danlos syndrome    Fibromyalgia    pt denies   Gastroparesis    GERD (gastroesophageal reflux disease)    History of hypotension    taken midodrine  in the past- Spring of 2022   Hypermobile Ehlers-Danlos syndrome    Irregular heart rate    Keratosis pilaris    Migraine    w and w/o auras   PCOS (polycystic ovarian syndrome) 10/26/2022   PONV (postoperative nausea and vomiting)    I can get really cold   PTSD (post-traumatic stress disorder)    Seasonal allergic rhinitis 03/11/2014   Past Surgical History:  Procedure Laterality Date   CHOLECYSTECTOMY  05/09/2021   ESOPHAGOGASTRODUODENOSCOPY ENDOSCOPY     03/2020   HIP ARTHROPLASTY Right    HIP ARTHROSCOPY Right 04/25/2022   Procedure: RIGHT HIP ARTHROSCOPY WITH LABRAL RECONSTRUCTION, ILIOTIBIAL BAND ALLOGRAFT, PSOAS RELEASE;  Surgeon: Genelle Standing, MD;  Location: [facility] OR;  Service: Orthopedics;  Laterality: Right;   HIP SURGERY Right    per pt   JOINT REPLACEMENT     Upcoming THR right hip 09/03/23    LABRAL REPAIR Right 03/02/2021   Procedure: RIGHT HIP ARTHROSCOPY WITH LABRAL REPAIR AND PINCE DEBRIDEMENT;  Surgeon: Genelle Standing, MD;  Location: [facility] OR;  Service: Orthopedics;  Laterality: Right;   SMART PILL PROCEDURE     12/2020   TYMPANOSTOMY TUBE PLACEMENT Bilateral    WISDOM TOOTH EXTRACTION     Patient Active Problem List   Diagnosis Date Noted   Chronic night sweats 08/21/2023   PCOS (polycystic ovarian syndrome) 10/26/2022   Cervical adenopathy 06/10/2021   Chronic pruritus 06/10/2021   Exercise-induced asthma 06/09/2021   Chronic venous insufficiency 06/03/2021   Orthostatic lightheadedness 06/03/2021   Purple toe syndrome of both feet (HCC) 06/03/2021   Autonomic dysfunction 06/03/2021   Tear of right acetabular labrum 04/13/2021   Migraine headache 03/01/2021   Headache 11/30/2020   Positive ANA (antinuclear antibody) 11/15/2020   Fibromyalgia 11/02/2020   Hair loss 11/02/2020   Muscle spasticity 10/22/2020   Hypermobile Ehlers-Danlos syndrome 10/22/2020   Paresthesia of bilateral legs 10/06/2020   Myalgia 10/06/2020   Chronic pelvic pain in female 10/06/2020   Chronic abdominal pain 10/06/2020   Irritable colon 08/02/2020   Pelvic floor dysfunction 08/02/2020   Irritant dermatitis 08/02/2020   Gastroesophageal reflux disease 06/28/2020   Severe  recurrent major depression without psychotic features (HCC) 06/28/2020   Polyarthralgia 06/28/2020   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 06/28/2020   Tachycardia 06/28/2020   History of COVID-19 06/28/2020   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 deficit 06/28/2020   Recurrent major depressive disorder, in full remission (HCC) 06/28/2020   Atypical chest pain 06/22/2020   Dizziness 06/22/2020   Palpitations 06/14/2020   Anxiety 05/04/2020   Internal and external hemorrhoids without complication 04/29/2020   Nondiabetic gastroparesis 03/03/2020   ETD (Eustachian tube dysfunction),  bilateral 10/21/2019   Acne vulgaris 03/11/2014   Mild intermittent asthma without complication 03/11/2014   Seasonal allergies 03/11/2014    PCP: Wendolyn Jenkins Jansky MD  REFERRING PROVIDER: Ezzard Redell Ezzard MD  REFERRING DIAG: M16.31 (ICD-10-CM) - 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  Rationale for Evaluation and Treatment: Rehabilitation  ONSET DATE: DOS: 10/22/23:   SUBJECTIVE:   SUBJECTIVE STATEMENT: Patient presents physical therapy evaluation status post right anterior hip replacement.  She has a history of 2 labral repairs in the past but after multiple surgical consultations it was found that her hip dysplasia was causing osteoarthritis and significant joint degeneration.  This is a sensitive topic for her because she has been dealing with this problem for several years and wonders how no one found hip dysplasia on her x-ray.  She opted for total hip replacement and overall is pleased with her progress.  She had home health physical therapy for 2 weeks following discharge from hospital.  Overall she has been keeping up with her exercises.  She walks with 1 crutch without incident but cannot limp the only thing I have difficulty with is putting on my socks.  She does have some issues with pain in her proximal anterior thigh and hip.  She has some numbness and tingling around the site.  She is planning to move back into her home this week which is on the first level.  She has not seen the surgeon yet but has an appointment in a few weeks.  She was unclear about any precautions other than what her home health therapist gave her.   PERTINENT HISTORY: Ehlers-Danlos syndrome, symptoms of dysautonomia including palpitations dizziness and heat intolerance  chronic joint and  muscle pain PAIN:  Are you having pain? Yes: NPRS scale: 5 Pain location: Rt ant hip  Pain description: throbbing, achy  Aggravating factors: standing, walking  Relieving factors: Changing positions but something that helps the most is an ice pack which she uses frequently  PRECAUTIONS: Anterior hip patient would be wise to follow due to collagen defect due to hypermobile Ehlers-Danlos syndrome  RED FLAGS: None   WEIGHT BEARING RESTRICTIONS: No  FALLS:  Has patient fallen in last 6 months? No  LIVING ENVIRONMENT: Lives with: lives alone Lives in: House/apartment Stairs: No Has following equipment at home: Crutches shower chair   OCCUPATION: [employer] out until 8/11 .   PLOF: Independent  PATIENT GOALS: I want to be walk pain free   NEXT MD VISIT: 7/18 [facility]   OBJECTIVE:  Note: Objective measures were completed at Evaluation unless otherwise noted.  DIAGNOSTIC FINDINGS: see chart ([facility])   PATIENT SURVEYS:  LEFS  Extreme difficulty/unable (0), Quite a bit of difficulty (1), Moderate difficulty (2), Little difficulty (3), No difficulty (4) Survey date:  11/20/2023  Any of your usual work, housework or school activities 0  2. Usual hobbies, recreational or sporting activities 0  3. Getting into/out of the bath 2  4. Walking between rooms 2  5. Putting on socks/shoes 1  6. Squatting  1  7. Lifting an object, like a bag of groceries from the floor 1  8. Performing light activities around your home 2  9. Performing heavy activities around your home 1  10. Getting into/out of a car 2  11. Walking 2 blocks 0  12. Walking 1 mile 0  13. Going up/down 10 stairs (1 flight) 1  14. Standing for 1 hour 0  15.  sitting for 1 hour 3  16. Running on even ground 0  17. Running on uneven ground 0  18. Making sharp turns while running fast 0  19. Hopping  0  20. Rolling over in bed 3  Score total:  19/80     COGNITION: Overall cognitive status: Within functional limits for  tasks assessed     SENSATION: Patient reports numbness and tingling near incision site and along the lateral thigh  EDEMA:  Minimal surrounding incision Patient points out a apparent deformity on the right lateral hip just distal to the greater trochanter.  She reports that was present before her total hip replacement but is questioning what it is it did feel a bit puffy to me but it could also be gluteus atrophy which makes it look a little bit more pronounced  POSTURE: No Significant postural limitations  PALPATION: Tender to palpation along  well-healed incision  LOWER EXTREMITY MNF:[facility] hip ER/IR   Active ROM Right eval Left eval  Hip flexion P90 P 120  Hip extension    Hip abduction    Hip adduction    Hip internal rotation A24,P25 A35, P30  Hip external rotation A3, P10 A30, P50   Knee flexion    Knee extension    Ankle dorsiflexion    Ankle plantarflexion    Ankle inversion    Ankle eversion     (Blank rows = not tested)  LOWER EXTREMITY MMT:  MMT Right eval Left eval  Hip flexion 4 5  Hip extension    Hip abduction    Hip adduction    Hip internal rotation 4 4  Hip external rotation 3+ 4  Knee flexion 4+ 5  Knee extension 5 5  Ankle dorsiflexion    Ankle plantarflexion    Ankle inversion    Ankle eversion     (Blank rows = not tested)    FUNCTIONAL TESTS:  5 times sit to stand: 16 seconds 30 seconds chair stand test, 9 repetitions  GAIT: Distance walked: 100 feet Assistive device utilized: Crutches x1 Level of assistance: Complete Independence Comments: Patient without appreciable limp for the first 75 feet, slower more cautious pace  TREATMENT DATE:  [facility] Adult PT Treatment:                                                DATE: 11/20/23 TSelf Care: Reviewed home program at length and prioritized Hip precautions and  how that would look in real life, should be followed until she sees MD due to EDS.  The provocative position for hip dislocation is: hip extension, external rotation. This approach has fewer restrictions Do not step backwards with surgical leg. No hip extension. Do not allow surgical leg to externally rotate (turn outwards). Do not cross your legs. Use a pillow between legs when rolling. Sleep on your surgical side when side lying.   PATIENT EDUCATION:  Education details: hip precautions, POC, HEP  Person educated: Patient Education method: Explanation, Demonstration, Verbal cues, and Handouts Education comprehension: verbalized understanding, returned demonstration, and needs further education  HOME EXERCISE PROGRAM: Access Code: [email or web address] URL: [email or web address] Date: 11/20/2023 Prepared by: Delon Norma  Exercises - Supine Quad Set  - 1 x daily - 7 x weekly - 2 sets - 10 reps - 30 hold - Supine Bridge  - 1 x daily - 7 x weekly - 2 sets - 10 reps - 30 hold (SMALL RANGE OF MOTION)  - Sit to Stand  - 1 x daily - 7 x weekly - 2 sets - 10 reps - 5 hold - Standing Hip Abduction with Counter Support  - 1 x daily - 7 x weekly - 2 sets - 10 reps - 5 hold (TOE FORWARD)  - Standing Marching  - 1 x daily - 7 x weekly - 2 sets - 10 reps - 5 hold  ASSESSMENT:  CLINICAL IMPRESSION: Patient is a 27y.o. female who was seen today for physical therapy evaluation and treatment for right total hip arthroplasty, with history of labral repair x 2 and hypermobile Ehlers-Danlos syndrome.  For this reason I recommend she follow hip precautions listed above and reviewed today.  I recommend she go into hip extension i.e. bridging but only part of the way, and pay special attention to having her toes pointed straight ahead when her hip.  OBJECTIVE IMPAIRMENTS: decreased activity tolerance, decreased endurance, decreased knowledge of use of DME, decreased mobility, difficulty walking, decreased  ROM, decreased strength, increased edema, increased fascial restrictions, increased muscle spasms, impaired sensation, and pain.   ACTIVITY LIMITATIONS: carrying, lifting, bending, sitting, standing, squatting, sleeping, transfers, bed mobility, dressing, and locomotion level  PARTICIPATION LIMITATIONS: cleaning, driving, shopping, community activity, and occupation  PERSONAL FACTORS: Past/current experiences, Transportation, and 1-2 comorbidities: EDS< previous hip surgeries are also affecting patient's functional outcome.   REHAB POTENTIAL: Excellent  CLINICAL DECISION MAKING: Evolving/moderate complexity  EVALUATION COMPLEXITY: Moderate   GOALS: Goals reviewed with patient? Yes  SHORT TERM GOALS: Target date: 12/18/2023   Patient will be able to show independence for initial HEP to include posture, core and hip strength and stability.   Baseline: Goal status: INITIAL  2.  Patient will be I with concepts of joint protection, hip precautions and stability as it pertains to joint hypermobility. Baseline:  Goal status: INITIAL  3.  Patient will be able to ambulate in the clinic without the use of crutch and no discernible limp 300 feet  Baseline:  Goal status: INITIAL   LONG TERM GOALS: Target date: 01/15/2024    LEFS  score will increase by 16 points indicating improved functional mobility Baseline: 19/80 Goal status: INITIAL  2.  Patient will be independent with home exercise program upon discharge from physical therapy Baseline:  Goal status: INITIAL  3.  Patient will be able to walk without assistive device and pain no more than 2/10 In the right hip community distances > 1000 feet  Baseline:  Goal status: INITIAL  4.  Patient will demonstrate a pain free squat in order to complete transfers repetitively and obtain items below the waist for work and home tasks Baseline:  Goal status: INITIAL  5.  Patient will be able to demonstrate hip/knee/ankle strength to 5/5 in  order to maximize functional mobility, ambulation and lifting.   Baseline:  Goal status: INITIAL  PLAN:  PT FREQUENCY: 2x/week  PT DURATION: 8 weeks  PLANNED INTERVENTIONS: 97164- PT Re-evaluation, 97750- Physical Performance Testing, 97110-Therapeutic exercises, 97530- Therapeutic activity, 97112- Neuromuscular re-education, 97535- Self Care, [id number]- Manual therapy, [phone]- Gait training, [phone]- Aquatic Therapy, [phone]- Electrical stimulation (unattended), Patient/Family education, Balance training, Stair training, Taping, DME instructions, Cryotherapy, and Moist heat  PLAN FOR NEXT SESSION: Review home exercise program, should follow hip precautions until she sees MD, manual therapy,    Brylan Dec, PT 11/20/2023, 1:16 PM   Delon Norma, PT 11/20/23 5:54 PM Phone: [phone] Fax: [phone]

## 2023-11-20 ENCOUNTER — Encounter: Payer: Self-pay | Admitting: Physical Therapy

## 2023-11-20 ENCOUNTER — Ambulatory Visit: Attending: Orthopedic Surgery | Admitting: Physical Therapy

## 2023-11-20 DIAGNOSIS — M6281 Muscle weakness (generalized): Secondary | ICD-10-CM | POA: Insufficient documentation

## 2023-11-20 DIAGNOSIS — M357 Hypermobility syndrome: Secondary | ICD-10-CM | POA: Insufficient documentation

## 2023-11-20 DIAGNOSIS — Z96641 Presence of right artificial hip joint: Secondary | ICD-10-CM | POA: Insufficient documentation

## 2023-11-20 DIAGNOSIS — F332 Major depressive disorder, recurrent severe without psychotic features: Secondary | ICD-10-CM | POA: Diagnosis not present

## 2023-11-20 DIAGNOSIS — M25551 Pain in right hip: Secondary | ICD-10-CM | POA: Diagnosis not present

## 2023-11-20 DIAGNOSIS — F411 Generalized anxiety disorder: Secondary | ICD-10-CM | POA: Diagnosis not present

## 2023-11-20 DIAGNOSIS — F3341 Major depressive disorder, recurrent, in partial remission: Secondary | ICD-10-CM | POA: Diagnosis not present

## 2023-11-20 DIAGNOSIS — F4312 Post-traumatic stress disorder, chronic: Secondary | ICD-10-CM | POA: Diagnosis not present

## 2023-11-21 DIAGNOSIS — F332 Major depressive disorder, recurrent severe without psychotic features: Secondary | ICD-10-CM | POA: Diagnosis not present

## 2023-11-22 ENCOUNTER — Ambulatory Visit: Payer: Self-pay | Admitting: Physical Therapy

## 2023-11-22 ENCOUNTER — Encounter: Payer: Self-pay | Admitting: Physical Therapy

## 2023-11-22 DIAGNOSIS — M357 Hypermobility syndrome: Secondary | ICD-10-CM

## 2023-11-22 DIAGNOSIS — M6281 Muscle weakness (generalized): Secondary | ICD-10-CM

## 2023-11-22 DIAGNOSIS — Z96641 Presence of right artificial hip joint: Secondary | ICD-10-CM

## 2023-11-22 DIAGNOSIS — M25551 Pain in right hip: Secondary | ICD-10-CM

## 2023-11-22 NOTE — Therapy (Signed)
 OUTPATIENT PHYSICAL THERAPY LOWER EXTREMITY NOTE    Patient Name: Brianna [name] MRN: [id number] DOB:[birth year], 28 y.o., female Today's Date: 11/22/2023  END OF SESSION:  PT End of Session - 11/22/23 0948     Visit Number 2    Number of Visits 16    Date for PT Re-Evaluation 01/15/24    Authorization Type [employer]    PT Start Time [phone]    PT Stop Time 1016    PT Time Calculation (min) 30 min    Activity Tolerance Patient tolerated treatment well    Behavior During Therapy [facility] for tasks assessed/performed           Past Medical History:  Diagnosis Date   Acute recurrent pansinusitis 10/21/2019   ADHD (attention deficit hyperactivity disorder)    inattentive   Allergy    Anemia    had iron infusions in Fall of 2023   Anxiety    Arthritis    Joints, hips   Asthma    EIA   Bronchitis    Cat allergy due to both airborne and skin contact 05/01/2016   Chronic sore throat 05/01/2016   COVID 2020   Depression    Ehlers-Danlos syndrome    Fibromyalgia    pt denies   Gastroparesis    GERD (gastroesophageal reflux disease)    History of hypotension    taken midodrine  in the past- Spring of 2022   Hypermobile Ehlers-Danlos syndrome    Irregular heart rate    Keratosis pilaris    Migraine    w and w/o auras   PCOS (polycystic ovarian syndrome) 10/26/2022   PONV (postoperative nausea and vomiting)    I can get really cold   PTSD (post-traumatic stress disorder)    Seasonal allergic rhinitis 03/11/2014   Past Surgical History:  Procedure Laterality Date   CHOLECYSTECTOMY  05/09/2021   ESOPHAGOGASTRODUODENOSCOPY ENDOSCOPY     03/2020   HIP ARTHROPLASTY Right    HIP ARTHROSCOPY Right 04/25/2022   Procedure: RIGHT HIP ARTHROSCOPY WITH LABRAL RECONSTRUCTION, ILIOTIBIAL BAND ALLOGRAFT, PSOAS RELEASE;  Surgeon: Genelle Standing, MD;  Location: [facility] OR;  Service: Orthopedics;  Laterality: Right;   HIP SURGERY Right    per pt   JOINT REPLACEMENT     Upcoming THR right  hip 09/03/23   LABRAL REPAIR Right 03/02/2021   Procedure: RIGHT HIP ARTHROSCOPY WITH LABRAL REPAIR AND PINCE DEBRIDEMENT;  Surgeon: Genelle Standing, MD;  Location: [facility] OR;  Service: Orthopedics;  Laterality: Right;   SMART PILL PROCEDURE     12/2020   TYMPANOSTOMY TUBE PLACEMENT Bilateral    WISDOM TOOTH EXTRACTION     Patient Active Problem List   Diagnosis Date Noted   Chronic night sweats 08/21/2023   PCOS (polycystic ovarian syndrome) 10/26/2022   Cervical adenopathy 06/10/2021   Chronic pruritus 06/10/2021   Exercise-induced asthma 06/09/2021   Chronic venous insufficiency 06/03/2021   Orthostatic lightheadedness 06/03/2021   Purple toe syndrome of both feet (HCC) 06/03/2021   Autonomic dysfunction 06/03/2021   Tear of right acetabular labrum 04/13/2021   Migraine headache 03/01/2021   Headache 11/30/2020   Positive ANA (antinuclear antibody) 11/15/2020   Fibromyalgia 11/02/2020   Hair loss 11/02/2020   Muscle spasticity 10/22/2020   Hypermobile Ehlers-Danlos syndrome 10/22/2020   Paresthesia of bilateral legs 10/06/2020   Myalgia 10/06/2020   Chronic pelvic pain in female 10/06/2020   Chronic abdominal pain 10/06/2020   Irritable colon 08/02/2020   Pelvic floor dysfunction 08/02/2020   Irritant dermatitis 08/02/2020  Gastroesophageal reflux disease 06/28/2020   Severe recurrent major depression without psychotic features (HCC) 06/28/2020   Polyarthralgia 06/28/2020   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 06/28/2020   Tachycardia 06/28/2020   History of COVID-19 06/28/2020   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 deficit 06/28/2020   Recurrent major depressive disorder, in full remission (HCC) 06/28/2020   Atypical chest pain 06/22/2020   Dizziness 06/22/2020   Palpitations 06/14/2020   Anxiety 05/04/2020   Internal and external hemorrhoids without complication 04/29/2020   Nondiabetic gastroparesis 03/03/2020   ETD (Eustachian tube  dysfunction), bilateral 10/21/2019   Acne vulgaris 03/11/2014   Mild intermittent asthma without complication 03/11/2014   Seasonal allergies 03/11/2014    PCP: Wendolyn Jenkins Jansky MD  REFERRING PROVIDER: Ezzard Redell Ezzard MD  REFERRING DIAG: [phone] (ICD-10-CM) - 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  Patient arrived 14 minutes late.  Patient reports her mental health was not good today.  Patient presents physical therapy evaluation status post right anterior hip replacement.  She has a history of 2 labral repairs in the past but after multiple surgical consultations it was found that her hip dysplasia was causing osteoarthritis and significant joint degeneration.  This is a sensitive topic for her because she has been dealing with this problem for several years and wonders how no one found hip dysplasia on her x-ray.  She opted for total hip replacement and overall is pleased with her progress.  She had home health physical therapy for 2 weeks following discharge from hospital.  Overall she has been keeping up with her exercises.  She walks with 1 crutch without incident but cannot limp the only thing I have difficulty with is putting on my socks.  She does have some issues with pain in her proximal anterior thigh and hip.  She has some numbness and tingling around the site.  She is planning to move back into her home this week which is on the first level.  She has not seen the surgeon yet but has an appointment in a few weeks.  She  was unclear about any precautions other than what her home health therapist gave her.   PERTINENT HISTORY: Ehlers-Danlos syndrome, symptoms of dysautonomia including palpitations dizziness and heat intolerance  chronic joint and muscle pain PAIN:  Are you having pain? Yes: NPRS scale: 5 Pain location: Rt ant hip  Pain description: throbbing, achy  Aggravating factors: standing, walking  Relieving factors: Changing positions but something that helps the most is an ice pack which she uses frequently  PRECAUTIONS: Anterior hip patient would be wise to follow due to collagen defect due to hypermobile Ehlers-Danlos syndrome  RED FLAGS: None   WEIGHT BEARING RESTRICTIONS: No  FALLS:  Has patient fallen in last 6 months? No  LIVING ENVIRONMENT: Lives with: lives alone Lives in: House/apartment Stairs: No Has following equipment at home: Crutches shower chair   OCCUPATION: [employer] out until 8/11 .   PLOF: Independent  PATIENT GOALS: I want to be walk pain free   NEXT MD VISIT: 7/18 [facility]   OBJECTIVE:  Note: Objective measures were completed at Evaluation unless otherwise noted.  DIAGNOSTIC FINDINGS: see chart ([facility])   PATIENT SURVEYS:  LEFS  Extreme difficulty/unable (0), Quite a bit of difficulty (1), Moderate difficulty (2), Little difficulty (3), No difficulty (4) Survey date:  11/20/2023  Any of your usual work, housework or school activities 0  2. Usual hobbies, recreational or sporting activities 0  3. Getting into/out of the bath 2  4. Walking between rooms 2  5. Putting on socks/shoes 1  6. Squatting  1  7. Lifting an object, like a bag of groceries from the floor 1  8. Performing light activities around your home 2  9. Performing heavy activities around your home 1  10. Getting into/out of a car 2  11. Walking 2 blocks 0  12. Walking 1 mile 0  13. Going up/down 10 stairs (1 flight) 1  14. Standing for 1 hour 0  15.  sitting for 1 hour 3  16. Running on even  ground 0  17. Running on uneven ground 0  18. Making sharp turns while running fast 0  19. Hopping  0  20. Rolling over in bed 3  Score total:  19/80     COGNITION: Overall cognitive status: Within functional limits for tasks assessed     SENSATION: Patient reports numbness and tingling near incision site and along the lateral thigh  EDEMA:  Minimal surrounding incision Patient points out a apparent deformity on the right lateral hip just distal to the greater trochanter.  She reports that was present before her total hip replacement but is questioning what it is it did feel a bit puffy to me but it could also be gluteus atrophy which makes it look a little bit more pronounced  POSTURE: No Significant postural limitations  PALPATION: Tender to palpation along  well-healed incision  LOWER EXTREMITY MNF:[facility] hip ER/IR   Active ROM Right eval Left eval  Hip flexion P90 P 120  Hip extension    Hip abduction    Hip adduction    Hip internal rotation A24,P25 A35, P30  Hip external rotation A3, P10 A30, P50   Knee flexion    Knee extension    Ankle dorsiflexion    Ankle plantarflexion    Ankle inversion    Ankle eversion     (Blank rows = not tested)  LOWER EXTREMITY MMT:  MMT Right eval Left eval  Hip flexion 4 5  Hip extension    Hip abduction    Hip adduction    Hip internal rotation 4 4  Hip external rotation 3+ 4  Knee flexion 4+ 5  Knee extension 5 5  Ankle dorsiflexion    Ankle plantarflexion    Ankle inversion    Ankle eversion     (Blank rows = not tested)    FUNCTIONAL TESTS:  5 times sit to stand: 16 seconds 30 seconds chair stand test, 9 repetitions  GAIT: Distance walked: 100 feet Assistive device utilized: Crutches x1 Level of assistance: Complete Independence Comments: Patient without appreciable limp for the first 75 feet, slower more cautious pace  TREATMENT DATE:  [facility] Adult PT Treatment:                                                DATE: 11/22/23 Therapeutic Activity: SAQ 1 lbs 5 sec x 15 Supine Bridge with blue band for alignment (50% range)  Lower ab march  Triple flexion x 10  blue band  Long leg SLR with A from the blue band  x 8  Sit to Stand wide x 10 good symmetry Standing Hip Abduction with Counter Support  x 10 and then semi circles  Standing Marching x 10 high knee add with circles   Self Care: Review hip precautions with demo   [facility] Adult PT Treatment:                                                DATE: 11/20/23 TSelf Care: Reviewed home program at length and prioritized Hip precautions and how that would look in real life, should be followed until she sees MD due to EDS.  The provocative position for hip dislocation is: hip extension, external rotation. This approach has fewer restrictions Do not step backwards with surgical leg. No hip extension. Do not allow surgical leg to externally rotate (turn outwards). Do not cross your legs. Use a pillow between legs when rolling. Sleep on your surgical side when side lying.   PATIENT EDUCATION:  Education details: hip precautions, POC, HEP  Person educated: Patient Education method: Explanation, Demonstration, Verbal cues, and Handouts Education comprehension: verbalized understanding, returned demonstration, and needs further education  HOME EXERCISE PROGRAM: Access Code: [email or web address] URL: [email or web address] Date: 11/20/2023 Prepared by: Delon Norma  Exercises - Supine Quad Set  - 1 x daily - 7 x weekly - 2 sets - 10 reps - 30 hold - Supine Bridge  - 1 x daily - 7 x weekly - 2 sets - 10 reps - 30 hold (SMALL RANGE OF MOTION)  - Sit to Stand  - 1 x daily - 7 x weekly - 2 sets - 10 reps - 5 hold - Standing Hip Abduction with Counter Support  - 1 x daily - 7 x weekly - 2 sets - 10 reps - 5 hold (TOE FORWARD)   - Standing Marching  - 1 x daily - 7 x weekly - 2 sets - 10 reps - 5 hold  ASSESSMENT:  CLINICAL IMPRESSION:  Session abbreviated due to late arrival.  Worked within hip precautions to ensure safety given her collagen defect.  She was able to tolerate full WB on the operated leg without increased pain.  Symmetrical sit to stand demonstrated today with min to no cues. Patient will continue to benefit from skilled PT in order to return to PLOF and optimize functional mobility.     OBJECTIVE IMPAIRMENTS: decreased activity tolerance, decreased endurance, decreased knowledge of use of DME, decreased mobility, difficulty walking, decreased ROM, decreased strength, increased edema, increased fascial restrictions, increased muscle spasms, impaired sensation, and pain.   ACTIVITY LIMITATIONS: carrying, lifting, bending, sitting, standing, squatting, sleeping, transfers, bed mobility, dressing, and locomotion level  PARTICIPATION LIMITATIONS: cleaning, driving, shopping, community activity, and occupation  PERSONAL FACTORS: Past/current experiences, Transportation, and 1-2 comorbidities: EDS< previous hip surgeries are also  affecting patient's functional outcome.   REHAB POTENTIAL: Excellent  CLINICAL DECISION MAKING: Evolving/moderate complexity  EVALUATION COMPLEXITY: Moderate   GOALS: Goals reviewed with patient? Yes  SHORT TERM GOALS: Target date: 12/18/2023   Patient will be able to show independence for initial HEP to include posture, core and hip strength and stability.   Baseline: Goal status: INITIAL  2.  Patient will be I with concepts of joint protection, hip precautions and stability as it pertains to joint hypermobility. Baseline:  Goal status: INITIAL  3.  Patient will be able to ambulate in the clinic without the use of crutch and no discernible limp 300 feet  Baseline:  Goal status: INITIAL   LONG TERM GOALS: Target date: 01/15/2024    LEFS score will increase by 16  points indicating improved functional mobility Baseline: 19/80 Goal status: INITIAL  2.  Patient will be independent with home exercise program upon discharge from physical therapy Baseline:  Goal status: INITIAL  3.  Patient will be able to walk without assistive device and pain no more than 2/10 In the right hip community distances > 1000 feet  Baseline:  Goal status: INITIAL  4.  Patient will demonstrate a pain free squat in order to complete transfers repetitively and obtain items below the waist for work and home tasks Baseline:  Goal status: INITIAL  5.  Patient will be able to demonstrate hip/knee/ankle strength to 5/5 in order to maximize functional mobility, ambulation and lifting.   Baseline:  Goal status: INITIAL  PLAN:  PT FREQUENCY: 2x/week  PT DURATION: 8 weeks  PLANNED INTERVENTIONS: 97164- PT Re-evaluation, 97750- Physical Performance Testing, 97110-Therapeutic exercises, 97530- Therapeutic activity, 97112- Neuromuscular re-education, 97535- Self Care, [id number]- Manual therapy, [phone]- Gait training, [phone]- Aquatic Therapy, [phone]- Electrical stimulation (unattended), Patient/Family education, Balance training, Stair training, Taping, DME instructions, Cryotherapy, and Moist heat  PLAN FOR NEXT SESSION: Review home exercise program, should follow hip precautions until she sees MD, manual therapy,    Myrtle Haller, PT 11/22/2023, 12:14 PM   Delon Norma, PT 11/22/23 12:14 PM Phone: [phone] Fax: [phone]

## 2023-11-26 DIAGNOSIS — M1611 Unilateral primary osteoarthritis, right hip: Secondary | ICD-10-CM | POA: Diagnosis not present

## 2023-11-26 DIAGNOSIS — R61 Generalized hyperhidrosis: Secondary | ICD-10-CM | POA: Diagnosis not present

## 2023-11-26 DIAGNOSIS — G909 Disorder of the autonomic nervous system, unspecified: Secondary | ICD-10-CM | POA: Diagnosis not present

## 2023-11-27 DIAGNOSIS — F411 Generalized anxiety disorder: Secondary | ICD-10-CM | POA: Diagnosis not present

## 2023-11-27 DIAGNOSIS — R195 Other fecal abnormalities: Secondary | ICD-10-CM | POA: Diagnosis not present

## 2023-11-27 DIAGNOSIS — F4312 Post-traumatic stress disorder, chronic: Secondary | ICD-10-CM | POA: Diagnosis not present

## 2023-11-27 DIAGNOSIS — K3184 Gastroparesis: Secondary | ICD-10-CM | POA: Diagnosis not present

## 2023-11-27 DIAGNOSIS — F332 Major depressive disorder, recurrent severe without psychotic features: Secondary | ICD-10-CM | POA: Diagnosis not present

## 2023-11-27 DIAGNOSIS — K582 Mixed irritable bowel syndrome: Secondary | ICD-10-CM | POA: Diagnosis not present

## 2023-11-28 ENCOUNTER — Ambulatory Visit: Admitting: Physical Therapy

## 2023-11-28 ENCOUNTER — Encounter: Payer: Self-pay | Admitting: Physical Therapy

## 2023-11-28 DIAGNOSIS — M6281 Muscle weakness (generalized): Secondary | ICD-10-CM

## 2023-11-28 DIAGNOSIS — Z96641 Presence of right artificial hip joint: Secondary | ICD-10-CM | POA: Diagnosis not present

## 2023-11-28 DIAGNOSIS — F332 Major depressive disorder, recurrent severe without psychotic features: Secondary | ICD-10-CM | POA: Diagnosis not present

## 2023-11-28 DIAGNOSIS — M357 Hypermobility syndrome: Secondary | ICD-10-CM

## 2023-11-28 DIAGNOSIS — M25551 Pain in right hip: Secondary | ICD-10-CM

## 2023-11-28 NOTE — Therapy (Signed)
 OUTPATIENT PHYSICAL THERAPY LOWER EXTREMITY NOTE    Patient Name: Brianna [name] MRN: [id number] DOB:05/28/1995, 28 y.o., female Today's Date: 11/28/2023  END OF SESSION:  PT End of Session - 11/28/23 1552     Visit Number 3    Number of Visits 16    Date for PT Re-Evaluation 01/15/24    Authorization Type [employer]    PT Start Time 1549    PT Stop Time 1630    PT Time Calculation (min) 41 min    Activity Tolerance Patient tolerated treatment well    Behavior During Therapy [facility] for tasks assessed/performed            Past Medical History:  Diagnosis Date   Acute recurrent pansinusitis 10/21/2019   ADHD (attention deficit hyperactivity disorder)    inattentive   Allergy    Anemia    had iron infusions in Fall of 2023   Anxiety    Arthritis    Joints, hips   Asthma    EIA   Bronchitis    Cat allergy due to both airborne and skin contact 05/01/2016   Chronic sore throat 05/01/2016   COVID 2020   Depression    Ehlers-Danlos syndrome    Fibromyalgia    pt denies   Gastroparesis    GERD (gastroesophageal reflux disease)    History of hypotension    taken midodrine  in the past- Spring of 2022   Hypermobile Ehlers-Danlos syndrome    Irregular heart rate    Keratosis pilaris    Migraine    w and w/o auras   PCOS (polycystic ovarian syndrome) 10/26/2022   PONV (postoperative nausea and vomiting)    I can get really cold   PTSD (post-traumatic stress disorder)    Seasonal allergic rhinitis 03/11/2014   Past Surgical History:  Procedure Laterality Date   CHOLECYSTECTOMY  05/09/2021   ESOPHAGOGASTRODUODENOSCOPY ENDOSCOPY     03/2020   HIP ARTHROPLASTY Right    HIP ARTHROSCOPY Right 04/25/2022   Procedure: RIGHT HIP ARTHROSCOPY WITH LABRAL RECONSTRUCTION, ILIOTIBIAL BAND ALLOGRAFT, PSOAS RELEASE;  Surgeon: Genelle Standing, MD;  Location: [facility] OR;  Service: Orthopedics;  Laterality: Right;   HIP SURGERY Right    per pt   JOINT REPLACEMENT     Upcoming THR  right hip 09/03/23   LABRAL REPAIR Right 03/02/2021   Procedure: RIGHT HIP ARTHROSCOPY WITH LABRAL REPAIR AND PINCE DEBRIDEMENT;  Surgeon: Genelle Standing, MD;  Location: [facility] OR;  Service: Orthopedics;  Laterality: Right;   SMART PILL PROCEDURE     12/2020   TYMPANOSTOMY TUBE PLACEMENT Bilateral    WISDOM TOOTH EXTRACTION     Patient Active Problem List   Diagnosis Date Noted   Chronic night sweats 08/21/2023   PCOS (polycystic ovarian syndrome) 10/26/2022   Cervical adenopathy 06/10/2021   Chronic pruritus 06/10/2021   Exercise-induced asthma 06/09/2021   Chronic venous insufficiency 06/03/2021   Orthostatic lightheadedness 06/03/2021   Purple toe syndrome of both feet (HCC) 06/03/2021   Autonomic dysfunction 06/03/2021   Tear of right acetabular labrum 04/13/2021   Migraine headache 03/01/2021   Headache 11/30/2020   Positive ANA (antinuclear antibody) 11/15/2020   Fibromyalgia 11/02/2020   Hair loss 11/02/2020   Muscle spasticity 10/22/2020   Hypermobile Ehlers-Danlos syndrome 10/22/2020   Paresthesia of bilateral legs 10/06/2020   Myalgia 10/06/2020   Chronic pelvic pain in female 10/06/2020   Chronic abdominal pain 10/06/2020   Irritable colon 08/02/2020   Pelvic floor dysfunction 08/02/2020   Irritant dermatitis  08/02/2020   Gastroesophageal reflux disease 06/28/2020   Severe recurrent major depression without psychotic features (HCC) 06/28/2020   Polyarthralgia 06/28/2020   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 06/28/2020   Tachycardia 06/28/2020   History of COVID-19 06/28/2020   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 deficit 06/28/2020   Recurrent major depressive disorder, in full remission (HCC) 06/28/2020   Atypical chest pain 06/22/2020   Dizziness 06/22/2020   Palpitations 06/14/2020   Anxiety 05/04/2020   Internal and external hemorrhoids without complication 04/29/2020   Nondiabetic gastroparesis 03/03/2020   ETD (Eustachian  tube dysfunction), bilateral 10/21/2019   Acne vulgaris 03/11/2014   Mild intermittent asthma without complication 03/11/2014   Seasonal allergies 03/11/2014    PCP: Wendolyn Jenkins Jansky MD  REFERRING PROVIDER: Ezzard Redell Ezzard MD  REFERRING DIAG: [phone] (ICD-10-CM) - 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  No new complaints.  I want to walk on the beach in 3 weeks. Do think I can do that?   Patient presents physical therapy evaluation status post right anterior hip replacement.  She has a history of 2 labral repairs in the past but after multiple surgical consultations it was found that her hip dysplasia was causing osteoarthritis and significant joint degeneration.  This is a sensitive topic for her because she has been dealing with this problem for several years and wonders how no one found hip dysplasia on her x-ray.  She opted for total hip replacement and overall is pleased with her progress.  She had home health physical therapy for 2 weeks following discharge from hospital.  Overall she has been keeping up with her exercises.  She walks with 1 crutch without incident but cannot limp the only thing I have difficulty with is putting on my socks.  She does have some issues with pain in her proximal anterior thigh and hip.  She has some numbness and tingling around the site.  She is planning to move back into her home this week which is on the first level.  She has not seen the surgeon yet but has an appointment in a few weeks.   She was unclear about any precautions other than what her home health therapist gave her.   PERTINENT HISTORY: Ehlers-Danlos syndrome, symptoms of dysautonomia including palpitations dizziness and heat intolerance  chronic joint and muscle pain PAIN:  Are you having pain? Yes: NPRS scale: 6 Pain location: Rt ant hip  Pain description: throbbing, achy  Aggravating factors: standing, walking  Relieving factors: Changing positions but something that helps the most is an ice pack which she uses frequently  PRECAUTIONS: Anterior hip patient would be wise to follow due to collagen defect due to hypermobile Ehlers-Danlos syndrome  RED FLAGS: None   WEIGHT BEARING RESTRICTIONS: No  FALLS:  Has patient fallen in last 6 months? No  LIVING ENVIRONMENT: Lives with: lives alone Lives in: House/apartment Stairs: No Has following equipment at home: Crutches shower chair   OCCUPATION: [employer] out until 8/11 .   PLOF: Independent  PATIENT GOALS: I want to be walk pain free  NEXT MD VISIT: 7/18 [facility]   OBJECTIVE:  Note: Objective measures were completed at Evaluation unless otherwise noted.  DIAGNOSTIC FINDINGS: see chart ([facility])   PATIENT SURVEYS:   COGNITION: Overall cognitive status: Within functional limits for tasks assessed     SENSATION: Patient reports numbness and tingling near incision site and along the lateral thigh  EDEMA:  Minimal surrounding incision Patient points out a apparent deformity on the right lateral hip just distal to the greater trochanter.  She reports that was present before her total hip replacement but is questioning what it is it did feel a bit puffy to me but it could also be gluteus atrophy which makes it look a little bit more pronounced  POSTURE: No Significant postural limitations  PALPATION: Tender to palpation along  well-healed incision  LOWER EXTREMITY MNF:[facility] hip ER/IR   Active ROM Right eval Left eval  Hip flexion P90 P 120   Hip extension    Hip abduction    Hip adduction    Hip internal rotation A24,P25 A35, P30  Hip external rotation A3, P10 A30, P50   Knee flexion    Knee extension    Ankle dorsiflexion    Ankle plantarflexion    Ankle inversion    Ankle eversion     (Blank rows = not tested)  LOWER EXTREMITY MMT:  MMT Right eval Left eval  Hip flexion 4 5  Hip extension    Hip abduction    Hip adduction    Hip internal rotation 4 4  Hip external rotation 3+ 4  Knee flexion 4+ 5  Knee extension 5 5  Ankle dorsiflexion    Ankle plantarflexion    Ankle inversion    Ankle eversion     (Blank rows = not tested)    FUNCTIONAL TESTS:  5 times sit to stand: 16 seconds 30 seconds chair stand test, 9 repetitions  GAIT: Distance walked: 100 feet Assistive device utilized: Crutches x1 Level of assistance: Complete Independence Comments: Patient without appreciable limp for the first 75 feet, slower more cautious pace                                                                                                                                TREATMENT DATE:   [facility] Adult PT Treatment:                                                DATE: 11/28/23 Therapeutic Activity: Bridge with yoga block x 10  Clam Green band x 15 Bridge with green band x 10  Hip flexion no band x to 90 deg High knee march  Hip abduction x 15  Gait in parallel bars 10 feet x 8 to normal pattern Squat (partial)  x 10 Heel raises x 15  Step  ups 4 inch, then 6 inch x 10  Reverse step down 4 inch x 10  Lateral step up x 10 added hip abduction x 10   [facility] Adult PT Treatment:                                                DATE: 11/22/23 Therapeutic Activity: SAQ 1 lbs 5 sec x 15 Supine Bridge with blue band for alignment (50% range)  Lower ab march  Triple flexion x 10  blue band  Long leg SLR with A from the blue band  x 8  Sit to Stand wide x 10 good symmetry Standing Hip Abduction with Counter Support  x 10 and  then semi circles  Standing Marching x 10 high knee add with circles   Self Care: Review hip precautions with demo   [facility] Adult PT Treatment:                                                DATE: 11/20/23 TSelf Care: Reviewed home program at length and prioritized Hip precautions and how that would look in real life, should be followed until she sees MD due to EDS.  The provocative position for hip dislocation is: hip extension, external rotation. This approach has fewer restrictions Do not step backwards with surgical leg. No hip extension. Do not allow surgical leg to externally rotate (turn outwards). Do not cross your legs. Use a pillow between legs when rolling. Sleep on your surgical side when side lying.   PATIENT EDUCATION:  Education details: hip precautions, POC, HEP  Person educated: Patient Education method: Explanation, Demonstration, Verbal cues, and Handouts Education comprehension: verbalized understanding, returned demonstration, and needs further education  HOME EXERCISE PROGRAM: Access Code: [email or web address] URL: [email or web address] Date: 11/20/2023 Prepared by: Delon Norma  Exercises - Supine Quad Set  - 1 x daily - 7 x weekly - 2 sets - 10 reps - 30 hold - Supine Bridge  - 1 x daily - 7 x weekly - 2 sets - 10 reps - 30 hold (SMALL RANGE OF MOTION)  - Sit to Stand  - 1 x daily - 7 x weekly - 2 sets - 10 reps - 5 hold - Standing Hip Abduction with Counter Support  - 1 x daily - 7 x weekly - 2 sets - 10 reps - 5 hold (TOE FORWARD)  - Standing Marching  - 1 x daily - 7 x weekly - 2 sets - 10 reps - 5 hold  ASSESSMENT:  CLINICAL IMPRESSION:  Shahidah continues to have moderate soreness and pain in her right anterior lateral hip status post total hip replacement.  Most of the session was done in standing to provide appropriate load to the right hip.  She is hoping to be able to go to the beach at the end of the month but is worried that she will not be able  to walk the amount required.  She did practice walking without the crutch today both in the parallel bars and then with a walking stick/trekking pole.  She was able to do that with just very minimal limp, just lacking confidence at this point.  She also continues to have a constant headache  behind her right eye that did interfere with the session somewhat.  Patient will continue to benefit from skilled PT in order to return to PLOF and optimize functional mobility.     OBJECTIVE IMPAIRMENTS: decreased activity tolerance, decreased endurance, decreased knowledge of use of DME, decreased mobility, difficulty walking, decreased ROM, decreased strength, increased edema, increased fascial restrictions, increased muscle spasms, impaired sensation, and pain.   ACTIVITY LIMITATIONS: carrying, lifting, bending, sitting, standing, squatting, sleeping, transfers, bed mobility, dressing, and locomotion level  PARTICIPATION LIMITATIONS: cleaning, driving, shopping, community activity, and occupation  PERSONAL FACTORS: Past/current experiences, Transportation, and 1-2 comorbidities: EDS< previous hip surgeries are also affecting patient's functional outcome.   REHAB POTENTIAL: Excellent  CLINICAL DECISION MAKING: Evolving/moderate complexity  EVALUATION COMPLEXITY: Moderate   GOALS: Goals reviewed with patient? Yes  SHORT TERM GOALS: Target date: 12/18/2023   Patient will be able to show independence for initial HEP to include posture, core and hip strength and stability.   Baseline: Goal status: Met  2.  Patient will be I with concepts of joint protection, hip precautions and stability as it pertains to joint hypermobility. Baseline:  Goal status: Met  3.  Patient will be able to ambulate in the clinic without the use of crutch and no discernible limp 300 feet  Baseline:  Goal status: Ongoing  LONG TERM GOALS: Target date: 01/15/2024    LEFS score will increase by 16 points indicating improved  functional mobility Baseline: 19/80 Goal status: INITIAL  2.  Patient will be independent with home exercise program upon discharge from physical therapy Baseline:  Goal status: INITIAL  3.  Patient will be able to walk without assistive device and pain no more than 2/10 In the right hip community distances > 1000 feet  Baseline:  Goal status: INITIAL  4.  Patient will demonstrate a pain free squat in order to complete transfers repetitively and obtain items below the waist for work and home tasks Baseline:  Goal status: INITIAL  5.  Patient will be able to demonstrate hip/knee/ankle strength to 5/5 in order to maximize functional mobility, ambulation and lifting.   Baseline:  Goal status: INITIAL  PLAN:  PT FREQUENCY: 2x/week  PT DURATION: 8 weeks  PLANNED INTERVENTIONS: 97164- PT Re-evaluation, 97750- Physical Performance Testing, 97110-Therapeutic exercises, 97530- Therapeutic activity, 97112- Neuromuscular re-education, 97535- Self Care, [id number]- Manual therapy, [phone]- Gait training, [phone]- Aquatic Therapy, [phone]- Electrical stimulation (unattended), Patient/Family education, Balance training, Stair training, Taping, DME instructions, Cryotherapy, and Moist heat  PLAN FOR NEXT SESSION: Gait with one trekking pole,  review home exercise program, should follow hip precautions until she sees MD, manual therapy,    Keimani Laufer, PT 11/28/2023, 6:05 PM   Delon Norma, PT 11/28/23 6:05 PM Phone: [phone] Fax: [phone]

## 2023-11-29 NOTE — Therapy (Signed)
 OUTPATIENT PHYSICAL THERAPY LOWER EXTREMITY NOTE    Patient Name: Brianna [name] MRN: [id number] DOB:04/01/1996, 28 y.o., female Today's Date: 11/30/2023  END OF SESSION:  PT End of Session - 11/30/23 1154     Visit Number 4    Number of Visits 16    Date for PT Re-Evaluation 01/15/24    Authorization Type [employer]    PT Start Time 1147    PT Stop Time 1240    PT Time Calculation (min) 53 min    Activity Tolerance Patient tolerated treatment well    Behavior During Therapy [facility] for tasks assessed/performed             Past Medical History:  Diagnosis Date   Acute recurrent pansinusitis 10/21/2019   ADHD (attention deficit hyperactivity disorder)    inattentive   Allergy    Anemia    had iron infusions in Fall of 2023   Anxiety    Arthritis    Joints, hips   Asthma    EIA   Bronchitis    Cat allergy due to both airborne and skin contact 05/01/2016   Chronic sore throat 05/01/2016   COVID 2020   Depression    Ehlers-Danlos syndrome    Fibromyalgia    pt denies   Gastroparesis    GERD (gastroesophageal reflux disease)    History of hypotension    taken midodrine  in the past- Spring of 2022   Hypermobile Ehlers-Danlos syndrome    Irregular heart rate    Keratosis pilaris    Migraine    w and w/o auras   PCOS (polycystic ovarian syndrome) 10/26/2022   PONV (postoperative nausea and vomiting)    I can get really cold   PTSD (post-traumatic stress disorder)    Seasonal allergic rhinitis 03/11/2014   Past Surgical History:  Procedure Laterality Date   CHOLECYSTECTOMY  05/09/2021   ESOPHAGOGASTRODUODENOSCOPY ENDOSCOPY     03/2020   HIP ARTHROPLASTY Right    HIP ARTHROSCOPY Right 04/25/2022   Procedure: RIGHT HIP ARTHROSCOPY WITH LABRAL RECONSTRUCTION, ILIOTIBIAL BAND ALLOGRAFT, PSOAS RELEASE;  Surgeon: Genelle Standing, MD;  Location: [facility] OR;  Service: Orthopedics;  Laterality: Right;   HIP SURGERY Right    per pt   JOINT REPLACEMENT     Upcoming THR  right hip 09/03/23   LABRAL REPAIR Right 03/02/2021   Procedure: RIGHT HIP ARTHROSCOPY WITH LABRAL REPAIR AND PINCE DEBRIDEMENT;  Surgeon: Genelle Standing, MD;  Location: [facility] OR;  Service: Orthopedics;  Laterality: Right;   SMART PILL PROCEDURE     12/2020   TYMPANOSTOMY TUBE PLACEMENT Bilateral    WISDOM TOOTH EXTRACTION     Patient Active Problem List   Diagnosis Date Noted   Chronic night sweats 08/21/2023   PCOS (polycystic ovarian syndrome) 10/26/2022   Cervical adenopathy 06/10/2021   Chronic pruritus 06/10/2021   Exercise-induced asthma 06/09/2021   Chronic venous insufficiency 06/03/2021   Orthostatic lightheadedness 06/03/2021   Purple toe syndrome of both feet (HCC) 06/03/2021   Autonomic dysfunction 06/03/2021   Tear of right acetabular labrum 04/13/2021   Migraine headache 03/01/2021   Headache 11/30/2020   Positive ANA (antinuclear antibody) 11/15/2020   Fibromyalgia 11/02/2020   Hair loss 11/02/2020   Muscle spasticity 10/22/2020   Hypermobile Ehlers-Danlos syndrome 10/22/2020   Paresthesia of bilateral legs 10/06/2020   Myalgia 10/06/2020   Chronic pelvic pain in female 10/06/2020   Chronic abdominal pain 10/06/2020   Irritable colon 08/02/2020   Pelvic floor dysfunction 08/02/2020   Irritant  dermatitis 08/02/2020   Gastroesophageal reflux disease 06/28/2020   Severe recurrent major depression without psychotic features (HCC) 06/28/2020   Polyarthralgia 06/28/2020   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 06/28/2020   Tachycardia 06/28/2020   History of COVID-19 06/28/2020   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 deficit 06/28/2020   Recurrent major depressive disorder, in full remission (HCC) 06/28/2020   Atypical chest pain 06/22/2020   Dizziness 06/22/2020   Palpitations 06/14/2020   Anxiety 05/04/2020   Internal and external hemorrhoids without complication 04/29/2020   Nondiabetic gastroparesis 03/03/2020   ETD (Eustachian  tube dysfunction), bilateral 10/21/2019   Acne vulgaris 03/11/2014   Mild intermittent asthma without complication 03/11/2014   Seasonal allergies 03/11/2014    PCP: Wendolyn Jenkins Jansky MD  REFERRING PROVIDER: Ezzard Redell Ezzard MD  REFERRING DIAG: M16.31 (ICD-10-CM) - 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  Hip is stiff.   Patient presents physical therapy evaluation status post right anterior hip replacement.  She has a history of 2 labral repairs in the past but after multiple surgical consultations it was found that her hip dysplasia was causing osteoarthritis and significant joint degeneration.  This is a sensitive topic for her because she has been dealing with this problem for several years and wonders how no one found hip dysplasia on her x-ray.  She opted for total hip replacement and overall is pleased with her progress.  She had home health physical therapy for 2 weeks following discharge from hospital.  Overall she has been keeping up with her exercises.  She walks with 1 crutch without incident but cannot limp the only thing I have difficulty with is putting on my socks.  She does have some issues with pain in her proximal anterior thigh and hip.  She has some numbness and tingling around the site.  She is planning to move back into her home this week which is on the first level.  She has not seen the surgeon yet but has an appointment in a few weeks.  She was unclear about any precautions other than what her home health  therapist gave her.   PERTINENT HISTORY: Ehlers-Danlos syndrome, symptoms of dysautonomia including palpitations dizziness and heat intolerance  chronic joint and muscle pain PAIN:  Are you having pain? Yes: NPRS scale: 6 Pain location: Rt ant hip  Pain description: throbbing, achy  Aggravating factors: standing, walking  Relieving factors: Changing positions but something that helps the most is an ice pack which she uses frequently  PRECAUTIONS: Anterior hip patient would be wise to follow due to collagen defect due to hypermobile Ehlers-Danlos syndrome  RED FLAGS: None   WEIGHT BEARING RESTRICTIONS: No  FALLS:  Has patient fallen in last 6 months? No  LIVING ENVIRONMENT: Lives with: lives alone Lives in: House/apartment Stairs: No Has following equipment at home: Crutches shower chair   OCCUPATION: [employer] out until 8/11 .   PLOF: Independent  PATIENT GOALS: I want to be walk pain free   NEXT MD VISIT: 7/18 [facility]   OBJECTIVE:  Note: Objective measures were completed at Evaluation unless otherwise  noted.  DIAGNOSTIC FINDINGS: see chart ([facility])   PATIENT SURVEYS:   COGNITION: Overall cognitive status: Within functional limits for tasks assessed     SENSATION: Patient reports numbness and tingling near incision site and along the lateral thigh  EDEMA:  Minimal surrounding incision Patient points out a apparent deformity on the right lateral hip just distal to the greater trochanter.  She reports that was present before her total hip replacement but is questioning what it is it did feel a bit puffy to me but it could also be gluteus atrophy which makes it look a little bit more pronounced  POSTURE: No Significant postural limitations  PALPATION: Tender to palpation along  well-healed incision  LOWER EXTREMITY MNF:[facility] hip ER/IR   Active ROM Right eval Left eval  Hip flexion P90 P 120  Hip extension    Hip abduction    Hip adduction    Hip internal  rotation A24,P25 A35, P30  Hip external rotation A3, P10 A30, P50   Knee flexion    Knee extension    Ankle dorsiflexion    Ankle plantarflexion    Ankle inversion    Ankle eversion     (Blank rows = not tested)  LOWER EXTREMITY MMT:  MMT Right eval Left eval  Hip flexion 4 5  Hip extension    Hip abduction    Hip adduction    Hip internal rotation 4 4  Hip external rotation 3+ 4  Knee flexion 4+ 5  Knee extension 5 5  Ankle dorsiflexion    Ankle plantarflexion    Ankle inversion    Ankle eversion     (Blank rows = not tested)    FUNCTIONAL TESTS:  5 times sit to stand: 16 seconds 30 seconds chair stand test, 9 repetitions  GAIT: Distance walked: 100 feet Assistive device utilized: Crutches x1 Level of assistance: Complete Independence Comments: Patient without appreciable limp for the first 75 feet, slower more cautious pace                                                                                                                                TREATMENT DATE:    [facility] Adult PT Treatment:                                                DATE: 11/30/23 Therapeutic Exercise: Recumbent bike 5 min L2  Bridging x 10 x 2 sets, legs on bolster Bridge with clam  Bridge with march  Knee to chest 30 sec x 3 using towel to assist  Hip IR and ER light supine  Hamstring dynamic stretch  Rt hip ER and IR in sidelying pillow between knees  Fire hydrant abd Therapeutic Activity: Standing walking no UEs Iso split lunge Rt LE FW 3 x 15  sec  Iso B stance hinge hold 3 x 15 sec  March x 10  March with red band x 10  Hip abduction red band x 10 each  Self Care: Driving restrictions  -discussed during therapeutic act Cold pack 10 min Rt hip   [facility] Adult PT Treatment:                                                DATE: 11/28/23 Therapeutic Activity: Bridge with yoga block x 10  Clam Green band x 15 Bridge with green band x 10  Hip flexion no band x to 90 deg High  knee march  Hip abduction x 15  Gait in parallel bars 10 feet x 8 to normal pattern Squat (partial)  x 10 Heel raises x 15  Step ups 4 inch, then 6 inch x 10  Reverse step down 4 inch x 10  Lateral step up x 10 added hip abduction x 10   [facility] Adult PT Treatment:                                                DATE: 11/22/23 Therapeutic Activity: SAQ 1 lbs 5 sec x 15 Supine Bridge with blue band for alignment (50% range)  Lower ab march  Triple flexion x 10  blue band  Long leg SLR with A from the blue band  x 8  Sit to Stand wide x 10 good symmetry Standing Hip Abduction with Counter Support  x 10 and then semi circles  Standing Marching x 10 high knee add with circles   Self Care: Review hip precautions with demo   [facility] Adult PT Treatment:                                                DATE: 11/20/23 TSelf Care: Reviewed home program at length and prioritized Hip precautions and how that would look in real life, should be followed until she sees MD due to EDS.  The provocative position for hip dislocation is: hip extension, external rotation. This approach has fewer restrictions Do not step backwards with surgical leg. No hip extension. Do not allow surgical leg to externally rotate (turn outwards). Do not cross your legs. Use a pillow between legs when rolling. Sleep on your surgical side when side lying.   PATIENT EDUCATION:  Education details: hip precautions, POC, HEP  Person educated: Patient Education method: Explanation, Demonstration, Verbal cues, and Handouts Education comprehension: verbalized understanding, returned demonstration, and needs further education  HOME EXERCISE PROGRAM: Access Code: [email or web address] URL: [email or web address] Date: 11/20/2023 Prepared by: Delon Norma  Exercises - Supine Quad Set  - 1 x daily - 7 x weekly - 2 sets - 10 reps - 30 hold - Supine Bridge  - 1 x daily - 7 x weekly - 2 sets - 10 reps - 30 hold (SMALL RANGE OF MOTION)   - Sit to Stand  - 1 x daily - 7 x weekly - 2 sets - 10 reps - 5 hold - Standing Hip Abduction with Counter Support  - 1  x daily - 7 x weekly - 2 sets - 10 reps - 5 hold (TOE FORWARD)  - Standing Marching  - 1 x daily - 7 x weekly - 2 sets - 10 reps - 5 hold  ASSESSMENT:  CLINICAL IMPRESSION:  Saralee continues to have moderate soreness and pain in her right anterior lateral hip status post total hip replacement. Pt was a bit shaky today prior and during session.  Emphasized need to strengthen glutes to a neutral position and not into more extension.  She is asking about driving, defer to MD regarding that, will likely be cleared to drive at 6- weeks.  Patient will continue to benefit from skilled PT in order to return to PLOF and optimize functional mobility.     OBJECTIVE IMPAIRMENTS: decreased activity tolerance, decreased endurance, decreased knowledge of use of DME, decreased mobility, difficulty walking, decreased ROM, decreased strength, increased edema, increased fascial restrictions, increased muscle spasms, impaired sensation, and pain.   ACTIVITY LIMITATIONS: carrying, lifting, bending, sitting, standing, squatting, sleeping, transfers, bed mobility, dressing, and locomotion level  PARTICIPATION LIMITATIONS: cleaning, driving, shopping, community activity, and occupation  PERSONAL FACTORS: Past/current experiences, Transportation, and 1-2 comorbidities: EDS< previous hip surgeries are also affecting patient's functional outcome.   REHAB POTENTIAL: Excellent  CLINICAL DECISION MAKING: Evolving/moderate complexity  EVALUATION COMPLEXITY: Moderate   GOALS: Goals reviewed with patient? Yes  SHORT TERM GOALS: Target date: 12/18/2023   Patient will be able to show independence for initial HEP to include posture, core and hip strength and stability.   Baseline: Goal status: Met  2.  Patient will be I with concepts of joint protection, hip precautions and stability as it pertains  to joint hypermobility. Baseline:  Goal status: Met  3.  Patient will be able to ambulate in the clinic without the use of crutch and no discernible limp 300 feet  Baseline:  Goal status: Ongoing  LONG TERM GOALS: Target date: 01/15/2024    LEFS score will increase by 16 points indicating improved functional mobility Baseline: 19/80 Goal status: INITIAL  2.  Patient will be independent with home exercise program upon discharge from physical therapy Baseline:  Goal status: INITIAL  3.  Patient will be able to walk without assistive device and pain no more than 2/10 In the right hip community distances > 1000 feet  Baseline:  Goal status: INITIAL  4.  Patient will demonstrate a pain free squat in order to complete transfers repetitively and obtain items below the waist for work and home tasks Baseline:  Goal status: INITIAL  5.  Patient will be able to demonstrate hip/knee/ankle strength to 5/5 in order to maximize functional mobility, ambulation and lifting.   Baseline:  Goal status: INITIAL  PLAN:  PT FREQUENCY: 2x/week  PT DURATION: 8 weeks  PLANNED INTERVENTIONS: 02835- PT Re-evaluation, 97750- Physical Performance Testing, 97110-Therapeutic exercises, 97530- Therapeutic activity, 97112- Neuromuscular re-education, 97535- Self Care, 02859- Manual therapy, [phone]- Gait training, [phone]- Aquatic Therapy, [phone]- Electrical stimulation (unattended), Patient/Family education, Balance training, Stair training, Taping, DME instructions, Cryotherapy, and Moist heat  PLAN FOR NEXT SESSION: Gait with one trekking pole,  try standing step ups, lateral and forward, hip abd . review home exercise program, should follow hip precautions until she sees MD 8/18.  manual therapy,    Broadus Costilla, PT 11/30/2023, 12:33 PM   Delon Norma, PT 11/30/23 12:33 PM Phone: [phone] Fax: [phone]

## 2023-11-30 ENCOUNTER — Ambulatory Visit: Admitting: Physical Therapy

## 2023-11-30 ENCOUNTER — Encounter: Payer: Self-pay | Admitting: Physical Therapy

## 2023-11-30 DIAGNOSIS — M357 Hypermobility syndrome: Secondary | ICD-10-CM

## 2023-11-30 DIAGNOSIS — Z96641 Presence of right artificial hip joint: Secondary | ICD-10-CM | POA: Diagnosis not present

## 2023-11-30 DIAGNOSIS — M25551 Pain in right hip: Secondary | ICD-10-CM | POA: Diagnosis not present

## 2023-11-30 DIAGNOSIS — M6281 Muscle weakness (generalized): Secondary | ICD-10-CM | POA: Diagnosis not present

## 2023-12-01 DIAGNOSIS — F332 Major depressive disorder, recurrent severe without psychotic features: Secondary | ICD-10-CM | POA: Diagnosis not present

## 2023-12-02 NOTE — Therapy (Unsigned)
 OUTPATIENT PHYSICAL THERAPY LOWER EXTREMITY NOTE    Patient Name: Brianna [name] MRN: [id number] DOB:January 04, 1996, 28 y.o., female Today's Date: 12/03/2023  END OF SESSION:  PT End of Session - 12/03/23 1559     Visit Number 5    Number of Visits 16    Date for PT Re-Evaluation 01/15/24    Authorization Type [employer]    PT Start Time 1550    PT Stop Time 1630    PT Time Calculation (min) 40 min    Activity Tolerance Patient tolerated treatment well    Behavior During Therapy [facility] for tasks assessed/performed              Past Medical History:  Diagnosis Date   Acute recurrent pansinusitis 10/21/2019   ADHD (attention deficit hyperactivity disorder)    inattentive   Allergy    Anemia    had iron infusions in Fall of 2023   Anxiety    Arthritis    Joints, hips   Asthma    EIA   Bronchitis    Cat allergy due to both airborne and skin contact 05/01/2016   Chronic sore throat 05/01/2016   COVID 2020   Depression    Ehlers-Danlos syndrome    Fibromyalgia    pt denies   Gastroparesis    GERD (gastroesophageal reflux disease)    History of hypotension    taken midodrine  in the past- Spring of 2022   Hypermobile Ehlers-Danlos syndrome    Irregular heart rate    Keratosis pilaris    Migraine    w and w/o auras   PCOS (polycystic ovarian syndrome) 10/26/2022   PONV (postoperative nausea and vomiting)    I can get really cold   PTSD (post-traumatic stress disorder)    Seasonal allergic rhinitis 03/11/2014   Past Surgical History:  Procedure Laterality Date   CHOLECYSTECTOMY  05/09/2021   ESOPHAGOGASTRODUODENOSCOPY ENDOSCOPY     03/2020   HIP ARTHROPLASTY Right    HIP ARTHROSCOPY Right 04/25/2022   Procedure: RIGHT HIP ARTHROSCOPY WITH LABRAL RECONSTRUCTION, ILIOTIBIAL BAND ALLOGRAFT, PSOAS RELEASE;  Surgeon: Genelle Standing, MD;  Location: [facility] OR;  Service: Orthopedics;  Laterality: Right;   HIP SURGERY Right    per pt   JOINT REPLACEMENT     Upcoming  THR right hip 09/03/23   LABRAL REPAIR Right 03/02/2021   Procedure: RIGHT HIP ARTHROSCOPY WITH LABRAL REPAIR AND PINCE DEBRIDEMENT;  Surgeon: Genelle Standing, MD;  Location: [facility] OR;  Service: Orthopedics;  Laterality: Right;   SMART PILL PROCEDURE     12/2020   TYMPANOSTOMY TUBE PLACEMENT Bilateral    WISDOM TOOTH EXTRACTION     Patient Active Problem List   Diagnosis Date Noted   Chronic night sweats 08/21/2023   PCOS (polycystic ovarian syndrome) 10/26/2022   Cervical adenopathy 06/10/2021   Chronic pruritus 06/10/2021   Exercise-induced asthma 06/09/2021   Chronic venous insufficiency 06/03/2021   Orthostatic lightheadedness 06/03/2021   Purple toe syndrome of both feet (HCC) 06/03/2021   Autonomic dysfunction 06/03/2021   Tear of right acetabular labrum 04/13/2021   Migraine headache 03/01/2021   Headache 11/30/2020   Positive ANA (antinuclear antibody) 11/15/2020   Fibromyalgia 11/02/2020   Hair loss 11/02/2020   Muscle spasticity 10/22/2020   Hypermobile Ehlers-Danlos syndrome 10/22/2020   Paresthesia of bilateral legs 10/06/2020   Myalgia 10/06/2020   Chronic pelvic pain in female 10/06/2020   Chronic abdominal pain 10/06/2020   Irritable colon 08/02/2020   Pelvic floor dysfunction 08/02/2020  Irritant dermatitis 08/02/2020   Gastroesophageal reflux disease 06/28/2020   Severe recurrent major depression without psychotic features (HCC) 06/28/2020   Polyarthralgia 06/28/2020   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 06/28/2020   Tachycardia 06/28/2020   History of COVID-19 06/28/2020   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 deficit 06/28/2020   Recurrent major depressive disorder, in full remission (HCC) 06/28/2020   Atypical chest pain 06/22/2020   Dizziness 06/22/2020   Palpitations 06/14/2020   Anxiety 05/04/2020   Internal and external hemorrhoids without complication 04/29/2020   Nondiabetic gastroparesis 03/03/2020   ETD  (Eustachian tube dysfunction), bilateral 10/21/2019   Acne vulgaris 03/11/2014   Mild intermittent asthma without complication 03/11/2014   Seasonal allergies 03/11/2014    PCP: Wendolyn Jenkins Jansky MD  REFERRING PROVIDER: Ezzard Redell Ezzard MD  REFERRING DIAG: [phone] (ICD-10-CM) - 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 Pain is 4.5/10 . My Rt hip could never go out to the side like the L side.   Patient presents physical therapy evaluation status post right anterior hip replacement.  She has a history of 2 labral repairs in the past but after multiple surgical consultations it was found that her hip dysplasia was causing osteoarthritis and significant joint degeneration.  This is a sensitive topic for her because she has been dealing with this problem for several years and wonders how no one found hip dysplasia on her x-ray.  She opted for total hip replacement and overall is pleased with her progress.  She had home health physical therapy for 2 weeks following discharge from hospital.  Overall she has been keeping up with her exercises.  She walks with 1 crutch without incident but cannot limp the only thing I have difficulty with is putting on my socks.  She does have some issues with pain in her proximal anterior thigh and hip.  She has some numbness and tingling around the site.  She is planning to move back into her home this week which is on the first level.  She has not seen the surgeon yet but has an  appointment in a few weeks.  She was unclear about any precautions other than what her home health therapist gave her.   PERTINENT HISTORY: Ehlers-Danlos syndrome, symptoms of dysautonomia including palpitations dizziness and heat intolerance  chronic joint and muscle pain PAIN:  Are you having pain? Yes: NPRS scale: 4/10  Pain location: Rt ant hip  Pain description: throbbing, achy  Aggravating factors: standing, walking  Relieving factors: Changing positions but something that helps the most is an ice pack which she uses frequently  PRECAUTIONS: Anterior hip patient would be wise to follow due to collagen defect due to hypermobile Ehlers-Danlos syndrome  RED FLAGS: None   WEIGHT BEARING RESTRICTIONS: No  FALLS:  Has patient fallen in last 6 months? No  LIVING ENVIRONMENT: Lives with: lives alone Lives in: House/apartment Stairs: No Has following equipment at home: Crutches shower chair   OCCUPATION: [employer] out until 8/11 .   PLOF: Independent  PATIENT GOALS: I want to be walk  pain free   NEXT MD VISIT: 7/18 [facility]   OBJECTIVE:  Note: Objective measures were completed at Evaluation unless otherwise noted.  DIAGNOSTIC FINDINGS: see chart ([facility])   PATIENT SURVEYS:   COGNITION: Overall cognitive status: Within functional limits for tasks assessed     SENSATION: Patient reports numbness and tingling near incision site and along the lateral thigh  EDEMA:  Minimal surrounding incision Patient points out a apparent deformity on the right lateral hip just distal to the greater trochanter.  She reports that was present before her total hip replacement but is questioning what it is it did feel a bit puffy to me but it could also be gluteus atrophy which makes it look a little bit more pronounced  POSTURE: No Significant postural limitations  PALPATION: Tender to palpation along  well-healed incision  LOWER EXTREMITY MNF:dzjuzi hip ER/IR   Active ROM Right eval  Left eval  Hip flexion P90 P 120  Hip extension    Hip abduction    Hip adduction    Hip internal rotation A24,P25 A35, P30  Hip external rotation A3, P10 A30, P50   Knee flexion    Knee extension    Ankle dorsiflexion    Ankle plantarflexion    Ankle inversion    Ankle eversion     (Blank rows = not tested)  LOWER EXTREMITY MMT:  MMT Right eval Left eval  Hip flexion 4 5  Hip extension    Hip abduction    Hip adduction    Hip internal rotation 4 4  Hip external rotation 3+ 4  Knee flexion 4+ 5  Knee extension 5 5  Ankle dorsiflexion    Ankle plantarflexion    Ankle inversion    Ankle eversion     (Blank rows = not tested)    FUNCTIONAL TESTS:  5 times sit to stand: 16 seconds 30 seconds chair stand test, 9 repetitions  GAIT: Distance walked: 100 feet Assistive device utilized: Crutches x1 Level of assistance: Complete Independence Comments: Patient without appreciable limp for the first 75 feet, slower more cautious pace                                                                                                                                TREATMENT DATE:   [facility] Adult PT Treatment:                                                DATE: 12/03/23 Therapeutic Exercise: Recumbent bike 5 min L2  Hip ER/IR Knee to chest , circles with UE support  Hamstring stretch (dynamic)  Therapeutic Activity: Footwork Double leg Parallel heels, toes narrow and wide Pilates V heels and toes narrow and wide  2 red 1 blue  Single leg  Heel in parallel and turnout  2 red 1 blue  Bridging 4 springs x 10, 75% range  10 min cold pack Rt hip  [facility] Adult PT Treatment:                                                DATE: 11/30/23 Therapeutic Exercise: Recumbent bike 5 min L2  Bridging x 10 x 2 sets, legs on bolster Bridge with clam  Bridge with march  Knee to chest 30 sec x 3 using towel to assist  Hip IR and ER light supine  Hamstring dynamic stretch  Rt hip ER  and IR in sidelying pillow between knees  Fire hydrant abd Therapeutic Activity: Standing walking no UEs Iso split lunge Rt LE FW 3 x 15 sec  Iso B stance hinge hold 3 x 15 sec  March x 10  March with red band x 10  Hip abduction red band x 10 each  Self Care: Driving restrictions  -discussed during therapeutic act Cold pack 10 min Rt hip   [facility] Adult PT Treatment:                                                DATE: 11/28/23 Therapeutic Activity: Bridge with yoga block x 10  Clam Green band x 15 Bridge with green band x 10  Hip flexion no band x to 90 deg High knee march  Hip abduction x 15  Gait in parallel bars 10 feet x 8 to normal pattern Squat (partial)  x 10 Heel raises x 15  Step ups 4 inch, then 6 inch x 10  Reverse step down 4 inch x 10  Lateral step up x 10 added hip abduction x 10   [facility] Adult PT Treatment:                                                DATE: 11/22/23 Therapeutic Activity: SAQ 1 lbs 5 sec x 15 Supine Bridge with blue band for alignment (50% range)  Lower ab march  Triple flexion x 10  blue band  Long leg SLR with A from the blue band  x 8  Sit to Stand wide x 10 good symmetry Standing Hip Abduction with Counter Support  x 10 and then semi circles  Standing Marching x 10 high knee add with circles   Self Care: Review hip precautions with demo   [facility] Adult PT Treatment:                                                DATE: 11/20/23 TSelf Care: Reviewed home program at length and prioritized Hip precautions and how that would look in real life, should be followed until she sees MD due to EDS.  The provocative position for hip dislocation is: hip extension, external rotation. This approach has fewer restrictions Do not step backwards with surgical leg. No hip extension. Do not allow surgical leg to externally rotate (turn outwards). Do  not cross your legs. Use a pillow between legs when rolling. Sleep on your surgical side when side  lying.   PATIENT EDUCATION:  Education details: hip precautions, POC, HEP  Person educated: Patient Education method: Explanation, Demonstration, Verbal cues, and Handouts Education comprehension: verbalized understanding, returned demonstration, and needs further education  HOME EXERCISE PROGRAM: Access Code: [email or web address] URL: [email or web address] Date: 11/20/2023 Prepared by: Delon Norma  Exercises - Supine Quad Set  - 1 x daily - 7 x weekly - 2 sets - 10 reps - 30 hold - Supine Bridge  - 1 x daily - 7 x weekly - 2 sets - 10 reps - 30 hold (SMALL RANGE OF MOTION)  - Sit to Stand  - 1 x daily - 7 x weekly - 2 sets - 10 reps - 5 hold - Standing Hip Abduction with Counter Support  - 1 x daily - 7 x weekly - 2 sets - 10 reps - 5 hold (TOE FORWARD)  - Standing Marching  - 1 x daily - 7 x weekly - 2 sets - 10 reps - 5 hold  ASSESSMENT:  CLINICAL IMPRESSION:   Patient was able to improve her gait without crutch (small distane) and reports doing this about 50% of the time at home.  Used Reformer for Lower body strength and pelvic stability.  She did well and needed only min cues for this. Sees MD this week .    Brianna [name] continues to have moderate soreness and pain in her right anterior lateral hip status post total hip replacement. Pt was a bit shaky today prior and during session.  Emphasized need to strengthen glutes to a neutral position and not into more extension.  She is asking about driving, defer to MD regarding that, will likely be cleared to drive at 6- weeks.  Patient will continue to benefit from skilled PT in order to return to PLOF and optimize functional mobility.     OBJECTIVE IMPAIRMENTS: decreased activity tolerance, decreased endurance, decreased knowledge of use of DME, decreased mobility, difficulty walking, decreased ROM, decreased strength, increased edema, increased fascial restrictions, increased muscle spasms, impaired sensation, and pain.   ACTIVITY  LIMITATIONS: carrying, lifting, bending, sitting, standing, squatting, sleeping, transfers, bed mobility, dressing, and locomotion level  PARTICIPATION LIMITATIONS: cleaning, driving, shopping, community activity, and occupation  PERSONAL FACTORS: Past/current experiences, Transportation, and 1-2 comorbidities: EDS< previous hip surgeries are also affecting patient's functional outcome.   REHAB POTENTIAL: Excellent  CLINICAL DECISION MAKING: Evolving/moderate complexity  EVALUATION COMPLEXITY: Moderate   GOALS: Goals reviewed with patient? Yes  SHORT TERM GOALS: Target date: 12/18/2023   Patient will be able to show independence for initial HEP to include posture, core and hip strength and stability.   Baseline: Goal status: Met  2.  Patient will be I with concepts of joint protection, hip precautions and stability as it pertains to joint hypermobility. Baseline:  Goal status: Met  3.  Patient will be able to ambulate in the clinic without the use of crutch and no discernible limp 300 feet  Baseline:  Goal status: Ongoing  LONG TERM GOALS: Target date: 01/15/2024    LEFS score will increase by 16 points indicating improved functional mobility Baseline: 19/80 Goal status: INITIAL  2.  Patient will be independent with home exercise program upon discharge from physical therapy Baseline:  Goal status: INITIAL  3.  Patient will be able to walk without assistive device and pain no more than 2/10 In the right hip community distances >  1000 feet  Baseline:  Goal status: INITIAL  4.  Patient will demonstrate a pain free squat in order to complete transfers repetitively and obtain items below the waist for work and home tasks Baseline:  Goal status: INITIAL  5.  Patient will be able to demonstrate hip/knee/ankle strength to 5/5 in order to maximize functional mobility, ambulation and lifting.   Baseline:  Goal status: INITIAL  PLAN:  PT FREQUENCY: 2x/week  PT DURATION: 8  weeks  PLANNED INTERVENTIONS: 02835- PT Re-evaluation, 97750- Physical Performance Testing, 97110-Therapeutic exercises, 97530- Therapeutic activity, 97112- Neuromuscular re-education, 97535- Self Care, 02859- Manual therapy, [phone]- Gait training, [phone]- Aquatic Therapy, [phone]- Electrical stimulation (unattended), Patient/Family education, Balance training, Stair training, Taping, DME instructions, Cryotherapy, and Moist heat  PLAN FOR NEXT SESSION: Gait with one trekking pole,  try standing step ups, lateral and forward, hip abd . review home exercise program, should follow hip precautions until she sees MD 8/18.  manual therapy,    Jaiyanna Safran, PT 12/03/2023, 4:33 PM   Delon Norma, PT 12/03/23 4:33 PM Phone: [phone] Fax: [phone]

## 2023-12-03 ENCOUNTER — Ambulatory Visit: Admitting: Physical Therapy

## 2023-12-03 ENCOUNTER — Encounter: Payer: Self-pay | Admitting: Physical Therapy

## 2023-12-03 DIAGNOSIS — M25551 Pain in right hip: Secondary | ICD-10-CM

## 2023-12-03 DIAGNOSIS — M6281 Muscle weakness (generalized): Secondary | ICD-10-CM | POA: Diagnosis not present

## 2023-12-03 DIAGNOSIS — M357 Hypermobility syndrome: Secondary | ICD-10-CM

## 2023-12-03 DIAGNOSIS — Z96641 Presence of right artificial hip joint: Secondary | ICD-10-CM

## 2023-12-04 DIAGNOSIS — F411 Generalized anxiety disorder: Secondary | ICD-10-CM | POA: Diagnosis not present

## 2023-12-04 DIAGNOSIS — F332 Major depressive disorder, recurrent severe without psychotic features: Secondary | ICD-10-CM | POA: Diagnosis not present

## 2023-12-04 DIAGNOSIS — F4312 Post-traumatic stress disorder, chronic: Secondary | ICD-10-CM | POA: Diagnosis not present

## 2023-12-05 ENCOUNTER — Ambulatory Visit: Admitting: Physical Therapy

## 2023-12-05 ENCOUNTER — Encounter: Payer: Self-pay | Admitting: Physical Therapy

## 2023-12-05 DIAGNOSIS — G909 Disorder of the autonomic nervous system, unspecified: Secondary | ICD-10-CM | POA: Diagnosis not present

## 2023-12-05 DIAGNOSIS — M25551 Pain in right hip: Secondary | ICD-10-CM

## 2023-12-05 DIAGNOSIS — Z96641 Presence of right artificial hip joint: Secondary | ICD-10-CM

## 2023-12-05 DIAGNOSIS — M357 Hypermobility syndrome: Secondary | ICD-10-CM | POA: Diagnosis not present

## 2023-12-05 DIAGNOSIS — F332 Major depressive disorder, recurrent severe without psychotic features: Secondary | ICD-10-CM | POA: Diagnosis not present

## 2023-12-05 DIAGNOSIS — M6281 Muscle weakness (generalized): Secondary | ICD-10-CM

## 2023-12-05 NOTE — Therapy (Signed)
 OUTPATIENT PHYSICAL THERAPY LOWER EXTREMITY NOTE    Patient Name: Brianna [name] MRN: [id number] DOB:Mar 20, 1996, 28 y.o., female Today's Date: 12/05/2023  END OF SESSION:  PT End of Session - 12/05/23 1420     Visit Number 6    Number of Visits 16    Date for PT Re-Evaluation 01/15/24    Authorization Type [employer]    PT Start Time 1415    PT Stop Time 1509    PT Time Calculation (min) 54 min               Past Medical History:  Diagnosis Date   Acute recurrent pansinusitis 10/21/2019   ADHD (attention deficit hyperactivity disorder)    inattentive   Allergy    Anemia    had iron infusions in Fall of 2023   Anxiety    Arthritis    Joints, hips   Asthma    EIA   Bronchitis    Cat allergy due to both airborne and skin contact 05/01/2016   Chronic sore throat 05/01/2016   COVID 2020   Depression    Ehlers-Danlos syndrome    Fibromyalgia    pt denies   Gastroparesis    GERD (gastroesophageal reflux disease)    History of hypotension    taken midodrine  in the past- Spring of 2022   Hypermobile Ehlers-Danlos syndrome    Irregular heart rate    Keratosis pilaris    Migraine    w and w/o auras   PCOS (polycystic ovarian syndrome) 10/26/2022   PONV (postoperative nausea and vomiting)    I can get really cold   PTSD (post-traumatic stress disorder)    Seasonal allergic rhinitis 03/11/2014   Past Surgical History:  Procedure Laterality Date   CHOLECYSTECTOMY  05/09/2021   ESOPHAGOGASTRODUODENOSCOPY ENDOSCOPY     03/2020   HIP ARTHROPLASTY Right    HIP ARTHROSCOPY Right 04/25/2022   Procedure: RIGHT HIP ARTHROSCOPY WITH LABRAL RECONSTRUCTION, ILIOTIBIAL BAND ALLOGRAFT, PSOAS RELEASE;  Surgeon: Genelle Standing, MD;  Location: [facility] OR;  Service: Orthopedics;  Laterality: Right;   HIP SURGERY Right    per pt   JOINT REPLACEMENT     Upcoming THR right hip 09/03/23   LABRAL REPAIR Right 03/02/2021   Procedure: RIGHT HIP ARTHROSCOPY WITH LABRAL REPAIR AND  PINCE DEBRIDEMENT;  Surgeon: Genelle Standing, MD;  Location: [facility] OR;  Service: Orthopedics;  Laterality: Right;   SMART PILL PROCEDURE     12/2020   TYMPANOSTOMY TUBE PLACEMENT Bilateral    WISDOM TOOTH EXTRACTION     Patient Active Problem List   Diagnosis Date Noted   Chronic night sweats 08/21/2023   PCOS (polycystic ovarian syndrome) 10/26/2022   Cervical adenopathy 06/10/2021   Chronic pruritus 06/10/2021   Exercise-induced asthma 06/09/2021   Chronic venous insufficiency 06/03/2021   Orthostatic lightheadedness 06/03/2021   Purple toe syndrome of both feet (HCC) 06/03/2021   Autonomic dysfunction 06/03/2021   Tear of right acetabular labrum 04/13/2021   Migraine headache 03/01/2021   Headache 11/30/2020   Positive ANA (antinuclear antibody) 11/15/2020   Fibromyalgia 11/02/2020   Hair loss 11/02/2020   Muscle spasticity 10/22/2020   Hypermobile Ehlers-Danlos syndrome 10/22/2020   Paresthesia of bilateral legs 10/06/2020   Myalgia 10/06/2020   Chronic pelvic pain in female 10/06/2020   Chronic abdominal pain 10/06/2020   Irritable colon 08/02/2020   Pelvic floor dysfunction 08/02/2020   Irritant dermatitis 08/02/2020   Gastroesophageal reflux disease 06/28/2020   Severe recurrent major depression without psychotic features (  HCC) 06/28/2020   Polyarthralgia 06/28/2020   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 06/28/2020   Tachycardia 06/28/2020   History of COVID-19 06/28/2020   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 deficit 06/28/2020   Recurrent major depressive disorder, in full remission (HCC) 06/28/2020   Atypical chest pain 06/22/2020   Dizziness 06/22/2020   Palpitations 06/14/2020   Anxiety 05/04/2020   Internal and external hemorrhoids without complication 04/29/2020   Nondiabetic gastroparesis 03/03/2020   ETD (Eustachian tube dysfunction), bilateral 10/21/2019   Acne vulgaris 03/11/2014   Mild intermittent asthma without  complication 03/11/2014   Seasonal allergies 03/11/2014    PCP: Wendolyn Jenkins Jansky MD  REFERRING PROVIDER: Ezzard Redell Ezzard MD  REFERRING DIAG: [phone] (ICD-10-CM) - 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  Calf tight on Rt side.  Has crutch but not using it much.    Patient presents physical therapy evaluation status post right anterior hip replacement.  She has a history of 2 labral repairs in the past but after multiple surgical consultations it was found that her hip dysplasia was causing osteoarthritis and significant joint degeneration.  This is a sensitive topic for her because she has been dealing with this problem for several years and wonders how no one found hip dysplasia on her x-ray.  She opted for total hip replacement and overall is pleased with her progress.  She had home health physical therapy for 2 weeks following discharge from hospital.  Overall she has been keeping up with her exercises.  She walks with 1 crutch without incident but cannot limp the only thing I have difficulty with is putting on my socks.  She does have some issues with pain in her proximal anterior thigh and hip.  She has some numbness and tingling around the site.  She is planning to move back into her home this week which is on the first level.  She has not seen the surgeon yet but has an appointment in a few weeks.  She was unclear about any precautions other than what her home health therapist gave her.   PERTINENT  HISTORY: Ehlers-Danlos syndrome, symptoms of dysautonomia including palpitations dizziness and heat intolerance  chronic joint and muscle pain PAIN:  Are you having pain? Yes: NPRS scale: 4/10  Pain location: Rt ant hip  Pain description: throbbing, achy  Aggravating factors: standing, walking  Relieving factors: Changing positions but something that helps the most is an ice pack which she uses frequently  PRECAUTIONS: Anterior hip patient would be wise to follow due to collagen defect due to hypermobile Ehlers-Danlos syndrome  RED FLAGS: None   WEIGHT BEARING RESTRICTIONS: No  FALLS:  Has patient fallen in last 6 months? No  LIVING ENVIRONMENT: Lives with: lives alone Lives in: House/apartment Stairs: No Has following equipment at home: Crutches shower chair   OCCUPATION: [employer] out until 8/11 .   PLOF: Independent  PATIENT GOALS: I want to be walk pain free   NEXT MD VISIT: 7/18 [facility]   OBJECTIVE:  Note: Objective measures were completed at Evaluation unless otherwise  noted.  DIAGNOSTIC FINDINGS: see chart ([facility])   PATIENT SURVEYS:   COGNITION: Overall cognitive status: Within functional limits for tasks assessed     SENSATION: Patient reports numbness and tingling near incision site and along the lateral thigh  EDEMA:  Minimal surrounding incision Patient points out a apparent deformity on the right lateral hip just distal to the greater trochanter.  She reports that was present before her total hip replacement but is questioning what it is it did feel a bit puffy to me but it could also be gluteus atrophy which makes it look a little bit more pronounced  POSTURE: No Significant postural limitations  PALPATION: Tender to palpation along  well-healed incision  LOWER EXTREMITY MNF:[facility] hip ER/IR   Active ROM Right eval Left eval  Hip flexion P90 P 120  Hip extension    Hip abduction    Hip adduction    Hip internal rotation A24,P25 A35, P30  Hip  external rotation A3, P10 A30, P50   Knee flexion    Knee extension    Ankle dorsiflexion    Ankle plantarflexion    Ankle inversion    Ankle eversion     (Blank rows = not tested)  LOWER EXTREMITY MMT:  MMT Right eval Left eval  Hip flexion 4 5  Hip extension    Hip abduction    Hip adduction    Hip internal rotation 4 4  Hip external rotation 3+ 4  Knee flexion 4+ 5  Knee extension 5 5  Ankle dorsiflexion    Ankle plantarflexion    Ankle inversion    Ankle eversion     (Blank rows = not tested)    FUNCTIONAL TESTS:  5 times sit to stand: 16 seconds 30 seconds chair stand test, 9 repetitions  GAIT: Distance walked: 100 feet Assistive device utilized: Crutches x1 Level of assistance: Complete Independence Comments: Patient without appreciable limp for the first 75 feet, slower more cautious pace                                                                                                                                TREATMENT DATE:    [facility] Adult PT Treatment:                                                DATE: 12/05/23 Therapeutic Exercise: Sit to stand no UE STS x 10 lbs  Sit to stand 10 lbs suitcase  Manual Therapy: STM to Rt ant lateral hip, thigh Scar tissue massage Rt hip   Therapeutic Activity: Gait no device, arm swing Added Rt UE holding 10 lbs  Hip abduction 3 lbs Rt LE 2 x 15 , each leg for WB and activity tolerance Step up x 15 each with opp hip flex  Modalities:  10 min Cold pack   [facility] Adult PT Treatment:                                                DATE: 12/03/23 Therapeutic Exercise: Recumbent bike 5 min L2  Hip ER/IR Knee to chest , circles with UE support  Hamstring stretch (dynamic)  Therapeutic Activity: Footwork Double leg Parallel heels, toes narrow and wide Pilates V heels and toes narrow and wide  2 red 1 blue  Single leg  Heel in parallel and turnout 2 red 1 blue  Bridging 4 springs x 10, 75% range  10 min cold  pack Rt hip  [facility] Adult PT Treatment:                                                DATE: 11/30/23 Therapeutic Exercise: Recumbent bike 5 min L2  Bridging x 10 x 2 sets, legs on bolster Bridge with clam  Bridge with march  Knee to chest 30 sec x 3 using towel to assist  Hip IR and ER light supine  Hamstring dynamic stretch  Rt hip ER and IR in sidelying pillow between knees  Fire hydrant abd Therapeutic Activity: Standing walking no UEs Iso split lunge Rt LE FW 3 x 15 sec  Iso B stance hinge hold 3 x 15 sec  March x 10  March with red band x 10  Hip abduction red band x 10 each  Self Care: Driving restrictions  -discussed during therapeutic act Cold pack 10 min Rt hip   [facility] Adult PT Treatment:                                                DATE: 11/28/23 Therapeutic Activity: Bridge with yoga block x 10  Clam Green band x 15 Bridge with green band x 10  Hip flexion no band x to 90 deg High knee march  Hip abduction x 15  Gait in parallel bars 10 feet x 8 to normal pattern Squat (partial)  x 10 Heel raises x 15  Step ups 4 inch, then 6 inch x 10  Reverse step down 4 inch x 10  Lateral step up x 10 added hip abduction x 10   [facility] Adult PT Treatment:                                                DATE: 11/22/23 Therapeutic Activity: SAQ 1 lbs 5 sec x 15 Supine Bridge with blue band for alignment (50% range)  Lower ab march  Triple flexion x 10  blue band  Long leg SLR with A from the blue band  x 8  Sit to Stand wide x 10 good symmetry Standing Hip Abduction with Counter Support  x 10 and then semi circles  Standing Marching x 10 high knee add with circles   Self Care: Review hip precautions with demo   [facility] Adult PT  Treatment:                                                DATE: 11/20/23 TSelf Care: Reviewed home program at length and prioritized Hip precautions and how that would look in real life, should be followed until she sees MD due to EDS.  The provocative  position for hip dislocation is: hip extension, external rotation. This approach has fewer restrictions Do not step backwards with surgical leg. No hip extension. Do not allow surgical leg to externally rotate (turn outwards). Do not cross your legs. Use a pillow between legs when rolling. Sleep on your surgical side when side lying.   PATIENT EDUCATION:  Education details: hip precautions, POC, HEP  Person educated: Patient Education method: Explanation, Demonstration, Verbal cues, and Handouts Education comprehension: verbalized understanding, returned demonstration, and needs further education  HOME EXERCISE PROGRAM: Access Code: [email or web address] URL: [email or web address] Date: 11/20/2023 Prepared by: Delon Norma  Exercises - Supine Quad Set  - 1 x daily - 7 x weekly - 2 sets - 10 reps - 30 hold - Supine Bridge  - 1 x daily - 7 x weekly - 2 sets - 10 reps - 30 hold (SMALL RANGE OF MOTION)  - Sit to Stand  - 1 x daily - 7 x weekly - 2 sets - 10 reps - 5 hold - Standing Hip Abduction with Counter Support  - 1 x daily - 7 x weekly - 2 sets - 10 reps - 5 hold (TOE FORWARD)  - Standing Marching  - 1 x daily - 7 x weekly - 2 sets - 10 reps - 5 hold  ASSESSMENT:  CLINICAL IMPRESSION:   Patient was able to walk without her crutch and no discernible limp.  She was sore today, offered manual to Rt hip prior to exercises today.  Progressing well through her protocol.  Sees MD this week.  Patient will continue to benefit from skilled PT in order to return to PLOF and optimize functional mobility.     OBJECTIVE IMPAIRMENTS: decreased activity tolerance, decreased endurance, decreased knowledge of use of DME, decreased mobility, difficulty walking, decreased ROM, decreased strength, increased edema, increased fascial restrictions, increased muscle spasms, impaired sensation, and pain.   ACTIVITY LIMITATIONS: carrying, lifting, bending, sitting, standing, squatting, sleeping, transfers,  bed mobility, dressing, and locomotion level  PARTICIPATION LIMITATIONS: cleaning, driving, shopping, community activity, and occupation  PERSONAL FACTORS: Past/current experiences, Transportation, and 1-2 comorbidities: EDS< previous hip surgeries are also affecting patient's functional outcome.   REHAB POTENTIAL: Excellent  CLINICAL DECISION MAKING: Evolving/moderate complexity  EVALUATION COMPLEXITY: Moderate   GOALS: Goals reviewed with patient? Yes  SHORT TERM GOALS: Target date: 12/18/2023   Patient will be able to show independence for initial HEP to include posture, core and hip strength and stability.   Baseline: Goal status: Met  2.  Patient will be I with concepts of joint protection, hip precautions and stability as it pertains to joint hypermobility. Baseline:  Goal status: Met  3.  Patient will be able to ambulate in the clinic without the use of crutch and no discernible limp 300 feet  Baseline:  Goal status: MET   LONG TERM GOALS: Target date: 01/15/2024    LEFS score will increase by 16 points indicating improved functional mobility Baseline: 19/80 Goal status: INITIAL  2.  Patient will be  independent with home exercise program upon discharge from physical therapy Baseline:  Goal status: INITIAL  3.  Patient will be able to walk without assistive device and pain no more than 2/10 In the right hip community distances > 1000 feet  Baseline:  Goal status: INITIAL  4.  Patient will demonstrate a pain free squat in order to complete transfers repetitively and obtain items below the waist for work and home tasks Baseline:  Goal status: INITIAL  5.  Patient will be able to demonstrate hip/knee/ankle strength to 5/5 in order to maximize functional mobility, ambulation and lifting.   Baseline:  Goal status: INITIAL  PLAN:  PT FREQUENCY: 2x/week  PT DURATION: 8 weeks  PLANNED INTERVENTIONS: 02835- PT Re-evaluation, 97750- Physical Performance Testing,  97110-Therapeutic exercises, 97530- Therapeutic activity, 97112- Neuromuscular re-education, 97535- Self Care, 02859- Manual therapy, [phone]- Gait training, [phone]- Aquatic Therapy, [phone]- Electrical stimulation (unattended), Patient/Family education, Balance training, Stair training, Taping, DME instructions, Cryotherapy, and Moist heat  PLAN FOR NEXT SESSION: Gait with one trekking pole,  try standing step ups, lateral and forward, hip abd . review home exercise program, should follow hip precautions until she sees MD 8/18.  manual therapy,    Charrisse Masley, PT 12/05/2023, 3:07 PM   Delon Norma, PT 12/05/23 3:07 PM Phone: [phone] Fax: [phone]

## 2023-12-06 DIAGNOSIS — R002 Palpitations: Secondary | ICD-10-CM | POA: Diagnosis not present

## 2023-12-06 DIAGNOSIS — R42 Dizziness and giddiness: Secondary | ICD-10-CM | POA: Diagnosis not present

## 2023-12-07 DIAGNOSIS — F332 Major depressive disorder, recurrent severe without psychotic features: Secondary | ICD-10-CM | POA: Diagnosis not present

## 2023-12-07 DIAGNOSIS — Z471 Aftercare following joint replacement surgery: Secondary | ICD-10-CM | POA: Diagnosis not present

## 2023-12-07 DIAGNOSIS — Z96641 Presence of right artificial hip joint: Secondary | ICD-10-CM | POA: Diagnosis not present

## 2023-12-10 ENCOUNTER — Ambulatory Visit: Admitting: Physical Therapy

## 2023-12-10 ENCOUNTER — Encounter: Payer: Self-pay | Admitting: Physical Therapy

## 2023-12-10 DIAGNOSIS — M357 Hypermobility syndrome: Secondary | ICD-10-CM

## 2023-12-10 DIAGNOSIS — M6281 Muscle weakness (generalized): Secondary | ICD-10-CM

## 2023-12-10 DIAGNOSIS — M25551 Pain in right hip: Secondary | ICD-10-CM

## 2023-12-10 DIAGNOSIS — F332 Major depressive disorder, recurrent severe without psychotic features: Secondary | ICD-10-CM | POA: Diagnosis not present

## 2023-12-10 DIAGNOSIS — R61 Generalized hyperhidrosis: Secondary | ICD-10-CM | POA: Diagnosis not present

## 2023-12-10 DIAGNOSIS — Z96641 Presence of right artificial hip joint: Secondary | ICD-10-CM | POA: Diagnosis not present

## 2023-12-10 DIAGNOSIS — G909 Disorder of the autonomic nervous system, unspecified: Secondary | ICD-10-CM | POA: Diagnosis not present

## 2023-12-10 NOTE — Therapy (Signed)
 OUTPATIENT PHYSICAL THERAPY LOWER EXTREMITY NOTE    Patient Name: Brianna [name] MRN: [id number] DOB:1996-05-22, 28 y.o., female Today's Date: 12/10/2023  END OF SESSION:  PT End of Session - 12/10/23 1550     Visit Number 7    Number of Visits 16    Date for PT Re-Evaluation 01/15/24    Authorization Type [employer]    PT Start Time 1548    PT Stop Time 1636    PT Time Calculation (min) 48 min    Activity Tolerance Patient tolerated treatment well    Behavior During Therapy [facility] for tasks assessed/performed               Past Medical History:  Diagnosis Date   Acute recurrent pansinusitis 10/21/2019   ADHD (attention deficit hyperactivity disorder)    inattentive   Allergy    Anemia    had iron infusions in Fall of 2023   Anxiety    Arthritis    Joints, hips   Asthma    EIA   Bronchitis    Cat allergy due to both airborne and skin contact 05/01/2016   Chronic sore throat 05/01/2016   COVID 2020   Depression    Ehlers-Danlos syndrome    Fibromyalgia    pt denies   Gastroparesis    GERD (gastroesophageal reflux disease)    History of hypotension    taken midodrine  in the past- Spring of 2022   Hypermobile Ehlers-Danlos syndrome    Irregular heart rate    Keratosis pilaris    Migraine    w and w/o auras   PCOS (polycystic ovarian syndrome) 10/26/2022   PONV (postoperative nausea and vomiting)    I can get really cold   PTSD (post-traumatic stress disorder)    Seasonal allergic rhinitis 03/11/2014   Past Surgical History:  Procedure Laterality Date   CHOLECYSTECTOMY  05/09/2021   ESOPHAGOGASTRODUODENOSCOPY ENDOSCOPY     03/2020   HIP ARTHROPLASTY Right    HIP ARTHROSCOPY Right 04/25/2022   Procedure: RIGHT HIP ARTHROSCOPY WITH LABRAL RECONSTRUCTION, ILIOTIBIAL BAND ALLOGRAFT, PSOAS RELEASE;  Surgeon: Genelle Standing, MD;  Location: [facility] OR;  Service: Orthopedics;  Laterality: Right;   HIP SURGERY Right    per pt   JOINT REPLACEMENT     Upcoming  THR right hip 09/03/23   LABRAL REPAIR Right 03/02/2021   Procedure: RIGHT HIP ARTHROSCOPY WITH LABRAL REPAIR AND PINCE DEBRIDEMENT;  Surgeon: Genelle Standing, MD;  Location: [facility] OR;  Service: Orthopedics;  Laterality: Right;   SMART PILL PROCEDURE     12/2020   TYMPANOSTOMY TUBE PLACEMENT Bilateral    WISDOM TOOTH EXTRACTION     Patient Active Problem List   Diagnosis Date Noted   Chronic night sweats 08/21/2023   PCOS (polycystic ovarian syndrome) 10/26/2022   Cervical adenopathy 06/10/2021   Chronic pruritus 06/10/2021   Exercise-induced asthma 06/09/2021   Chronic venous insufficiency 06/03/2021   Orthostatic lightheadedness 06/03/2021   Purple toe syndrome of both feet (HCC) 06/03/2021   Autonomic dysfunction 06/03/2021   Tear of right acetabular labrum 04/13/2021   Migraine headache 03/01/2021   Headache 11/30/2020   Positive ANA (antinuclear antibody) 11/15/2020   Fibromyalgia 11/02/2020   Hair loss 11/02/2020   Muscle spasticity 10/22/2020   Hypermobile Ehlers-Danlos syndrome 10/22/2020   Paresthesia of bilateral legs 10/06/2020   Myalgia 10/06/2020   Chronic pelvic pain in female 10/06/2020   Chronic abdominal pain 10/06/2020   Irritable colon 08/02/2020   Pelvic floor dysfunction 08/02/2020  Irritant dermatitis 08/02/2020   Gastroesophageal reflux disease 06/28/2020   Severe recurrent major depression without psychotic features (HCC) 06/28/2020   Polyarthralgia 06/28/2020   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 06/28/2020   Tachycardia 06/28/2020   History of COVID-19 06/28/2020   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 deficit 06/28/2020   Recurrent major depressive disorder, in full remission (HCC) 06/28/2020   Atypical chest pain 06/22/2020   Dizziness 06/22/2020   Palpitations 06/14/2020   Anxiety 05/04/2020   Internal and external hemorrhoids without complication 04/29/2020   Nondiabetic gastroparesis 03/03/2020   ETD  (Eustachian tube dysfunction), bilateral 10/21/2019   Acne vulgaris 03/11/2014   Mild intermittent asthma without complication 03/11/2014   Seasonal allergies 03/11/2014    PCP: Wendolyn Jenkins Jansky MD  REFERRING PROVIDER: Ezzard Redell Ezzard MD  REFERRING DIAG: M16.31 (ICD-10-CM) - 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  Goes back only if needed in 3-4 mos.  OK to drive if she feels ok. Not using crutches.    Patient presents physical therapy evaluation status post right anterior hip replacement.  She has a history of 2 labral repairs in the past but after multiple surgical consultations it was found that her hip dysplasia was causing osteoarthritis and significant joint degeneration.  This is a sensitive topic for her because she has been dealing with this problem for several years and wonders how no one found hip dysplasia on her x-ray.  She opted for total hip replacement and overall is pleased with her progress.  She had home health physical therapy for 2 weeks following discharge from hospital.  Overall she has been keeping up with her exercises.  She walks with 1 crutch without incident but cannot limp the only thing I have difficulty with is putting on my socks.  She does have some issues with pain in her proximal anterior thigh and hip.  She has some numbness and tingling around the site.  She is planning to move back into her home this week which is on the first level.  She has not seen the surgeon yet but has an appointment in  a few weeks.  She was unclear about any precautions other than what her home health therapist gave her.   PERTINENT HISTORY: Ehlers-Danlos syndrome, symptoms of dysautonomia including palpitations dizziness and heat intolerance  chronic joint and muscle pain PAIN:  Are you having pain? Yes: NPRS scale: not mentioned /10  Pain location: Rt ant hip  Pain description: throbbing, achy  Aggravating factors: standing, walking  Relieving factors: Changing positions but something that helps the most is an ice pack which she uses frequently  PRECAUTIONS: Anterior hip patient would be wise to follow due to collagen defect due to hypermobile Ehlers-Danlos syndrome  RED FLAGS: None   WEIGHT BEARING RESTRICTIONS: No  FALLS:  Has patient fallen in last 6 months? No  LIVING ENVIRONMENT: Lives with: lives alone Lives in: House/apartment Stairs: No Has following equipment at home: Crutches shower chair   OCCUPATION: [employer] out until 8/11 .   PLOF: Independent  PATIENT GOALS: I want to be walk  pain free   NEXT MD VISIT: 7/18 [facility]   OBJECTIVE:  Note: Objective measures were completed at Evaluation unless otherwise noted.  DIAGNOSTIC FINDINGS: see chart ([facility])   PATIENT SURVEYS:   COGNITION: Overall cognitive status: Within functional limits for tasks assessed     SENSATION: Patient reports numbness and tingling near incision site and along the lateral thigh  EDEMA:  Minimal surrounding incision Patient points out a apparent deformity on the right lateral hip just distal to the greater trochanter.  She reports that was present before her total hip replacement but is questioning what it is it did feel a bit puffy to me but it could also be gluteus atrophy which makes it look a little bit more pronounced  POSTURE: No Significant postural limitations  PALPATION: Tender to palpation along  well-healed incision  LOWER EXTREMITY MNF:[facility]juzi hip ER/IR   Active ROM Right eval  Left eval  Hip flexion P90 P 120  Hip extension    Hip abduction    Hip adduction    Hip internal rotation A24,P25 A35, P30  Hip external rotation A3, P10 A30, P50   Knee flexion    Knee extension    Ankle dorsiflexion    Ankle plantarflexion    Ankle inversion    Ankle eversion     (Blank rows = not tested)  LOWER EXTREMITY MMT:  MMT Right eval Left eval  Hip flexion 4 5  Hip extension    Hip abduction    Hip adduction    Hip internal rotation 4 4  Hip external rotation 3+ 4  Knee flexion 4+ 5  Knee extension 5 5  Ankle dorsiflexion    Ankle plantarflexion    Ankle inversion    Ankle eversion     (Blank rows = not tested)    FUNCTIONAL TESTS:  5 times sit to stand: 16 seconds 30 seconds chair stand test, 9 repetitions  GAIT: Distance walked: 100 feet Assistive device utilized: Crutches x1 Level of assistance: Complete Independence Comments: Patient without appreciable limp for the first 75 feet, slower more cautious pace                                                                                                                                TREATMENT DATE:   [facility] Adult PT Treatment:                                                DATE: 12/10/23 Therapeutic Exercise: Recumbent bike L3, 6 min  Therapeutic Activity: Seated hip flexion with abduction (simulate driving motion of foot pedal)  Sidelying hip CARS Hip abduction Clam no band  Ball squeeze x 10 to bridge with ball x 10  Single leg bridge x 10 R/L  Quadruped hip hinge Quadruped hip extension  Glute med drops off yoga block  Hip abduction x 10  Hip hinge x 15 single leg, no hands for the last 5 reps  Manual :   PROM Rt hip with compression to glutes   Trigger point work to [employer] glute/piriformis    [facility] Adult PT Treatment:                                                DATE: 12/05/23 Therapeutic Exercise: Sit to stand no UE STS x 10 lbs  Sit to stand 10 lbs suitcase  Manual Therapy: STM  to Rt ant lateral hip, thigh Scar tissue massage Rt hip   Therapeutic Activity: Gait no device, arm swing Added Rt UE holding 10 lbs  Hip abduction 3 lbs Rt LE 2 x 15 , each leg for WB and activity tolerance Step up x 15 each with opp hip flex   Modalities:  10 min Cold pack   [facility] Adult PT Treatment:                                                DATE: 12/03/23 Therapeutic Exercise: Recumbent bike 5 min L2  Hip ER/IR Knee to chest , circles with UE support  Hamstring stretch (dynamic)  Therapeutic Activity: Footwork Double leg Parallel heels, toes narrow and wide Pilates V heels and toes narrow and wide  2 red 1 blue  Single leg  Heel in parallel and turnout 2 red 1 blue  Bridging 4 springs x 10, 75% range  10 min cold pack Rt hip  [facility] Adult PT Treatment:                                                DATE: 11/30/23 Therapeutic Exercise: Recumbent bike 5 min L2  Bridging x 10 x 2 sets, legs on bolster Bridge with clam  Bridge with march  Knee to chest 30 sec x 3 using towel to assist  Hip IR and ER light supine  Hamstring dynamic stretch  Rt hip ER and IR in sidelying pillow between knees  Fire hydrant abd Therapeutic Activity: Standing walking no UEs Iso split lunge Rt LE FW 3 x 15 sec  Iso B stance hinge hold 3 x 15 sec  March x 10  March with red band x 10  Hip abduction red band x 10 each  Self Care: Driving restrictions  -discussed during therapeutic act Cold pack 10 min Rt hip   [facility] Adult PT Treatment:                                                DATE: 11/28/23 Therapeutic Activity: Bridge with yoga block x 10  Clam Green band x 15 Bridge with green band x 10  Hip flexion no band x to 90 deg High knee march  Hip abduction x 15  Gait in parallel bars 10 feet x 8 to normal pattern Squat (partial)  x  10 Heel raises x 15  Step ups 4 inch, then 6 inch x 10  Reverse step down 4 inch x 10  Lateral step up x 10 added hip abduction x 10    PATIENT  EDUCATION:  Education details: hip precautions, POC, HEP  Person educated: Patient Education method: Programmer, multimedia, Demonstration, Verbal cues, and Handouts Education comprehension: verbalized understanding, returned demonstration, and needs further education  HOME EXERCISE PROGRAM: Access Code: [email or web address] URL: [email or web address] Date: 11/20/2023 Prepared by: Delon Norma  Exercises - Supine Quad Set  - 1 x daily - 7 x weekly - 2 sets - 10 reps - 30 hold - Supine Bridge  - 1 x daily - 7 x weekly - 2 sets - 10 reps - 30 hold (SMALL RANGE OF MOTION)  - Sit to Stand  - 1 x daily - 7 x weekly - 2 sets - 10 reps - 5 hold - Standing Hip Abduction with Counter Support  - 1 x daily - 7 x weekly - 2 sets - 10 reps - 5 hold (TOE FORWARD)  - Standing Marching  - 1 x daily - 7 x weekly - 2 sets - 10 reps - 5 hold  ASSESSMENT:  CLINICAL IMPRESSION:  Patient was able to tolerate glute focused strengthening today with min to mod increase in pain. Offered light manual to Rt post hip end of session.  Discussed trigger point dry needling and cash bash billing for that. Patient will continue to benefit from skilled PT in order to return to PLOF and optimize functional mobility.     OBJECTIVE IMPAIRMENTS: decreased activity tolerance, decreased endurance, decreased knowledge of use of DME, decreased mobility, difficulty walking, decreased ROM, decreased strength, increased edema, increased fascial restrictions, increased muscle spasms, impaired sensation, and pain.   ACTIVITY LIMITATIONS: carrying, lifting, bending, sitting, standing, squatting, sleeping, transfers, bed mobility, dressing, and locomotion level  PARTICIPATION LIMITATIONS: cleaning, driving, shopping, community activity, and occupation  PERSONAL FACTORS: Past/current experiences, Transportation, and 1-2 comorbidities: EDS< previous hip surgeries are also affecting patient's functional outcome.   REHAB POTENTIAL:  Excellent  CLINICAL DECISION MAKING: Evolving/moderate complexity  EVALUATION COMPLEXITY: Moderate   GOALS: Goals reviewed with patient? Yes  SHORT TERM GOALS: Target date: 12/18/2023   Patient will be able to show independence for initial HEP to include posture, core and hip strength and stability.   Baseline: Goal status: Met  2.  Patient will be I with concepts of joint protection, hip precautions and stability as it pertains to joint hypermobility. Baseline:  Goal status: Met  3.  Patient will be able to ambulate in the clinic without the use of crutch and no discernible limp 300 feet  Baseline:  Goal status: MET   LONG TERM GOALS: Target date: 01/15/2024    LEFS score will increase by 16 points indicating improved functional mobility Baseline: 19/80 Goal status: INITIAL  2.  Patient will be independent with home exercise program upon discharge from physical therapy Baseline:  Goal status: INITIAL  3.  Patient will be able to walk without assistive device and pain no more than 2/10 In the right hip community distances > 1000 feet  Baseline:  Goal status: INITIAL  4.  Patient will demonstrate a pain free squat in order to complete transfers repetitively and obtain items below the waist for work and home tasks Baseline:  Goal status: INITIAL  5.  Patient will be able to demonstrate hip/knee/ankle strength to 5/5 in order to maximize functional mobility, ambulation and lifting.  Baseline:  Goal status: INITIAL  PLAN:  PT FREQUENCY: 2x/week  PT DURATION: 8 weeks  PLANNED INTERVENTIONS: [id number]- PT Re-evaluation, 97750- Physical Performance Testing, 97110-Therapeutic exercises, 97530- Therapeutic activity, [id number]- Neuromuscular re-education, 97535- Self Care, [id number]- Manual therapy, [phone]- Gait training, [phone]- Aquatic Therapy, [phone]- Electrical stimulation (unattended), Patient/Family education, Balance training, Stair training, Taping, DME instructions, Cryotherapy,  and Moist heat  PLAN FOR NEXT SESSION: standing step ups, lateral and forward, hip abd . review home exercise program, should follow hip precautions until she sees MD 8/18.  manual therapy,    Sota Hetz, PT 12/10/2023, 4:47 PM   Delon Norma, PT 12/10/23 4:47 PM Phone: [phone] Fax: [phone]

## 2023-12-11 DIAGNOSIS — F332 Major depressive disorder, recurrent severe without psychotic features: Secondary | ICD-10-CM | POA: Diagnosis not present

## 2023-12-11 DIAGNOSIS — F4312 Post-traumatic stress disorder, chronic: Secondary | ICD-10-CM | POA: Diagnosis not present

## 2023-12-11 DIAGNOSIS — F411 Generalized anxiety disorder: Secondary | ICD-10-CM | POA: Diagnosis not present

## 2023-12-11 NOTE — Therapy (Unsigned)
 OUTPATIENT PHYSICAL THERAPY LOWER EXTREMITY NOTE    Patient Name: Brianna [name] MRN: [id number] DOB:10-04-1995, 28 y.o., female Today's Date: 12/12/2023  END OF SESSION:  PT End of Session - 12/12/23 1330     Visit Number 8    Number of Visits 16    Date for PT Re-Evaluation 01/15/24    Authorization Type [employer]    PT Start Time 1330    PT Stop Time 1415    PT Time Calculation (min) 45 min    Behavior During Therapy [facility] for tasks assessed/performed                Past Medical History:  Diagnosis Date   Acute recurrent pansinusitis 10/21/2019   ADHD (attention deficit hyperactivity disorder)    inattentive   Allergy    Anemia    had iron infusions in Fall of 2023   Anxiety    Arthritis    Joints, hips   Asthma    EIA   Bronchitis    Cat allergy due to both airborne and skin contact 05/01/2016   Chronic sore throat 05/01/2016   COVID 2020   Depression    Ehlers-Danlos syndrome    Fibromyalgia    pt denies   Gastroparesis    GERD (gastroesophageal reflux disease)    History of hypotension    taken midodrine  in the past- Spring of 2022   Hypermobile Ehlers-Danlos syndrome    Irregular heart rate    Keratosis pilaris    Migraine    w and w/o auras   PCOS (polycystic ovarian syndrome) 10/26/2022   PONV (postoperative nausea and vomiting)    I can get really cold   PTSD (post-traumatic stress disorder)    Seasonal allergic rhinitis 03/11/2014   Past Surgical History:  Procedure Laterality Date   CHOLECYSTECTOMY  05/09/2021   ESOPHAGOGASTRODUODENOSCOPY ENDOSCOPY     03/2020   HIP ARTHROPLASTY Right    HIP ARTHROSCOPY Right 04/25/2022   Procedure: RIGHT HIP ARTHROSCOPY WITH LABRAL RECONSTRUCTION, ILIOTIBIAL BAND ALLOGRAFT, PSOAS RELEASE;  Surgeon: Genelle Standing, MD;  Location: [facility] OR;  Service: Orthopedics;  Laterality: Right;   HIP SURGERY Right    per pt   JOINT REPLACEMENT     Upcoming THR right hip 09/03/23   LABRAL REPAIR Right  03/02/2021   Procedure: RIGHT HIP ARTHROSCOPY WITH LABRAL REPAIR AND PINCE DEBRIDEMENT;  Surgeon: Genelle Standing, MD;  Location: [facility] OR;  Service: Orthopedics;  Laterality: Right;   SMART PILL PROCEDURE     12/2020   TYMPANOSTOMY TUBE PLACEMENT Bilateral    WISDOM TOOTH EXTRACTION     Patient Active Problem List   Diagnosis Date Noted   Chronic night sweats 08/21/2023   PCOS (polycystic ovarian syndrome) 10/26/2022   Cervical adenopathy 06/10/2021   Chronic pruritus 06/10/2021   Exercise-induced asthma 06/09/2021   Chronic venous insufficiency 06/03/2021   Orthostatic lightheadedness 06/03/2021   Purple toe syndrome of both feet (HCC) 06/03/2021   Autonomic dysfunction 06/03/2021   Tear of right acetabular labrum 04/13/2021   Migraine headache 03/01/2021   Headache 11/30/2020   Positive ANA (antinuclear antibody) 11/15/2020   Fibromyalgia 11/02/2020   Hair loss 11/02/2020   Muscle spasticity 10/22/2020   Hypermobile Ehlers-Danlos syndrome 10/22/2020   Paresthesia of bilateral legs 10/06/2020   Myalgia 10/06/2020   Chronic pelvic pain in female 10/06/2020   Chronic abdominal pain 10/06/2020   Irritable colon 08/02/2020   Pelvic floor dysfunction 08/02/2020   Irritant dermatitis 08/02/2020   Gastroesophageal reflux  disease 06/28/2020   Severe recurrent major depression without psychotic features (HCC) 06/28/2020   Polyarthralgia 06/28/2020   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 06/28/2020   Tachycardia 06/28/2020   History of COVID-19 06/28/2020   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 deficit 06/28/2020   Recurrent major depressive disorder, in full remission (HCC) 06/28/2020   Atypical chest pain 06/22/2020   Dizziness 06/22/2020   Palpitations 06/14/2020   Anxiety 05/04/2020   Internal and external hemorrhoids without complication 04/29/2020   Nondiabetic gastroparesis 03/03/2020   ETD (Eustachian tube dysfunction), bilateral 10/21/2019    Acne vulgaris 03/11/2014   Mild intermittent asthma without complication 03/11/2014   Seasonal allergies 03/11/2014    PCP: Wendolyn Jenkins Jansky MD  REFERRING PROVIDER: Ezzard Redell Ezzard MD  REFERRING DIAG: [id number] (ICD-10-CM) - 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     Patient presents physical therapy evaluation status post right anterior hip replacement.  She has a history of 2 labral repairs in the past but after multiple surgical consultations it was found that her hip dysplasia was causing osteoarthritis and significant joint degeneration.  This is a sensitive topic for her because she has been dealing with this problem for several years and wonders how no one found hip dysplasia on her x-ray.  She opted for total hip replacement and overall is pleased with her progress.  She had home health physical therapy for 2 weeks following discharge from hospital.  Overall she has been keeping up with her exercises.  She walks with 1 crutch without incident but cannot limp the only thing I have difficulty with is putting on my socks.  She does have some issues with pain in her proximal anterior thigh and hip.  She has some numbness and tingling around the site.  She is planning to move back into her home this week which is on the first level.  She has not seen the surgeon yet but has an appointment in a few weeks.  She was unclear about any precautions other than what her home health therapist gave her.    PERTINENT HISTORY: Ehlers-Danlos syndrome, symptoms of dysautonomia including palpitations dizziness and heat intolerance  chronic joint and muscle pain PAIN:  Are you having pain? Yes: NPRS scale: 7 /10  Pain location: Rt ant hip  Pain description: throbbing, achy  Aggravating factors: standing, walking  Relieving factors: Changing positions but something that helps the most is an ice pack which she uses frequently  PRECAUTIONS: Anterior hip patient would be wise to follow due to collagen defect due to hypermobile Ehlers-Danlos syndrome  RED FLAGS: None   WEIGHT BEARING RESTRICTIONS: No  FALLS:  Has patient fallen in last 6 months? No  LIVING ENVIRONMENT: Lives with: lives alone Lives in: House/apartment Stairs: No Has following equipment at home: Crutches shower chair   OCCUPATION: [employer] out until 8/11 .   PLOF: Independent  PATIENT GOALS: I want to be walk pain free   NEXT MD VISIT: 7/18 [facility]   OBJECTIVE:  Note: Objective measures were completed at  Evaluation unless otherwise noted.  DIAGNOSTIC FINDINGS: see chart ([facility])   PATIENT SURVEYS:   COGNITION: Overall cognitive status: Within functional limits for tasks assessed     SENSATION: Patient reports numbness and tingling near incision site and along the lateral thigh  EDEMA:  Minimal surrounding incision Patient points out a apparent deformity on the right lateral hip just distal to the greater trochanter.  She reports that was present before her total hip replacement but is questioning what it is it did feel a bit puffy to me but it could also be gluteus atrophy which makes it look a little bit more pronounced  POSTURE: No Significant postural limitations  PALPATION: Tender to palpation along  well-healed incision  LOWER EXTREMITY MNF:[facility]juzi hip ER/IR   Active ROM Right eval Left eval  Hip flexion P90 P 120  Hip extension    Hip abduction    Hip adduction    Hip internal rotation A24,P25  A35, P30  Hip external rotation A3, P10 A30, P50   Knee flexion    Knee extension    Ankle dorsiflexion    Ankle plantarflexion    Ankle inversion    Ankle eversion     (Blank rows = not tested)  LOWER EXTREMITY MMT:  MMT Right eval Left eval  Hip flexion 4 5  Hip extension    Hip abduction    Hip adduction    Hip internal rotation 4 4  Hip external rotation 3+ 4  Knee flexion 4+ 5  Knee extension 5 5  Ankle dorsiflexion    Ankle plantarflexion    Ankle inversion    Ankle eversion     (Blank rows = not tested)    FUNCTIONAL TESTS:  5 times sit to stand: 16 seconds 30 seconds chair stand test, 9 repetitions  GAIT: Distance walked: 100 feet Assistive device utilized: Crutches x1 Level of assistance: Complete Independence Comments: Patient without appreciable limp for the first 75 feet, slower more cautious pace                                                                                                                                TREATMENT DATE:    [facility] Adult PT Treatment:                                                DATE: 12/12/23 Therapeutic Exercise: Supine quad activation L LE with thigh supported Hamstring stretch  Leg lengthener Isometric extension of Rt hip x 10, 5 sec  Ball squeeze to bridge x 10  Quad stretch x 5, 30 sec Rt LE in prone with pillow under knees   Therapeutic Activity: Pilates Reformer used for LE/core strength, postural strength, lumbopelvic disassociation and core control.  Exercises included: Footwork 2 red 1 blue parallel and ER  narrow and wide Calf raises  Single leg 2 red 1 blue x 15 added alt knee/hip over/under bar  Cold pack 10 min Rt hip    [facility] Adult PT Treatment:                                                DATE: 12/10/23 Therapeutic Exercise: Recumbent bike L3, 6 min  Therapeutic Activity: Seated hip flexion with abduction (simulate driving motion of foot pedal)  Sidelying hip CARS Hip abduction Clam no  band  Ball squeeze x 10 to bridge with ball x 10  Single leg bridge x 10 R/L  Quadruped hip hinge Quadruped hip extension  Glute med drops off yoga block  Hip abduction x 10  Hip hinge x 15 single leg, no hands for the last 5 reps  Manual :   PROM Rt hip with compression to glutes   Trigger point work to [employer] glute/piriformis    [facility] Adult PT Treatment:                                                DATE: 12/05/23 Therapeutic Exercise: Sit to stand no UE STS x 10 lbs  Sit to stand 10 lbs suitcase  Manual Therapy: STM to Rt ant lateral hip, thigh Scar tissue massage Rt hip   Therapeutic Activity: Gait no device, arm swing Added Rt UE holding 10 lbs  Hip abduction 3 lbs Rt LE 2 x 15 , each leg for WB and activity tolerance Step up x 15 each with opp hip flex   Modalities:  10 min Cold pack   [facility] Adult PT Treatment:                                                DATE: 12/03/23 Therapeutic Exercise: Recumbent bike 5 min L2  Hip ER/IR Knee to chest , circles with UE support  Hamstring stretch (dynamic)  Therapeutic Activity: Footwork Double leg Parallel heels, toes narrow and wide Pilates V heels and toes narrow and wide  2 red 1 blue  Single leg  Heel in parallel and turnout 2 red 1 blue  Bridging 4 springs x 10, 75% range  10 min cold pack Rt hip  [facility] Adult PT Treatment:                                                DATE: 11/30/23 Therapeutic Exercise: Recumbent bike 5 min L2  Bridging x 10 x 2 sets, legs on bolster Bridge with clam  Bridge with march  Knee to chest 30 sec x 3 using towel to assist  Hip IR and ER light supine  Hamstring dynamic stretch  Rt hip ER and IR in sidelying pillow between knees  Fire hydrant abd Therapeutic Activity: Standing walking no UEs Iso split lunge Rt LE FW 3 x 15 sec  Iso B stance hinge hold 3 x 15 sec  March x 10  March with red band x 10  Hip abduction red band x 10 each  Self Care: Driving restrictions  -discussed  during therapeutic act Cold pack 10 min Rt hip   [facility] Adult PT Treatment:                                                DATE: 11/28/23 Therapeutic Activity: Bridge with yoga block x 10  Clam Green band x 15 Bridge with green band x 10  Hip flexion no band x to 90 deg High knee march  Hip abduction x 15  Gait in parallel bars 10 feet x 8 to normal pattern Squat (partial)  x 10 Heel raises x 15  Step ups 4 inch, then 6 inch x 10  Reverse step down 4 inch x 10  Lateral step up x 10 added hip abduction x 10    PATIENT EDUCATION:  Education details: hip precautions, POC, HEP  Person educated: Patient Education method: Programmer, multimedia, Demonstration, Verbal cues, and Handouts Education comprehension: verbalized understanding, returned demonstration, and needs further education  HOME EXERCISE PROGRAM: Access Code: [email or web address] URL: [email or web address] Date: 11/20/2023 Prepared by: Delon Norma  Exercises - Supine Quad Set  - 1 x daily - 7 x weekly - 2 sets - 10 reps - 30 hold - Supine Bridge  - 1 x daily - 7 x weekly - 2 sets - 10 reps - 30 hold (SMALL RANGE OF MOTION)  - Sit to Stand  - 1 x daily - 7 x weekly - 2 sets - 10 reps - 5 hold - Standing Hip Abduction with Counter Support  - 1 x daily - 7 x weekly - 2 sets - 10 reps - 5 hold (TOE FORWARD)  - Standing Marching  - 1 x daily - 7 x weekly - 2 sets - 10 reps - 5 hold  ASSESSMENT:  CLINICAL IMPRESSION:  Patient continues to have soreness in the right anterior hip with pain reported as 7/10.  Time spent on the Reformer today beneficial to load the joint with intention and safety.  She has good body awareness and alignment., needing only min cues for form today.  She may have an issue with financials once her plan starts over again in Aug. (Deductible) .  She has been driving and has been feeling good about that. Patient will continue to benefit from skilled PT in order to return to PLOF and optimize functional mobility.      OBJECTIVE IMPAIRMENTS: decreased activity tolerance, decreased endurance, decreased knowledge of use of DME, decreased mobility, difficulty walking, decreased ROM, decreased strength, increased edema, increased fascial restrictions, increased muscle spasms, impaired sensation, and pain.   ACTIVITY LIMITATIONS: carrying, lifting, bending, sitting, standing, squatting, sleeping, transfers, bed mobility, dressing, and locomotion level  PARTICIPATION LIMITATIONS: cleaning, driving, shopping, community activity, and occupation  PERSONAL FACTORS: Past/current experiences, Transportation, and 1-2 comorbidities: EDS< previous hip surgeries are also affecting patient's functional outcome.   REHAB POTENTIAL: Excellent  CLINICAL DECISION MAKING: Evolving/moderate complexity  EVALUATION COMPLEXITY: Moderate   GOALS: Goals reviewed with patient? Yes  SHORT TERM GOALS: Target date: 12/18/2023   Patient will be able to show independence for initial HEP to include posture, core and hip strength and stability.   Baseline: Goal status: Met  2.  Patient will be I with concepts of joint protection, hip precautions and  stability as it pertains to joint hypermobility. Baseline:  Goal status: Met  3.  Patient will be able to ambulate in the clinic without the use of crutch and no discernible limp 300 feet  Baseline:  Goal status: MET   LONG TERM GOALS: Target date: 01/15/2024    LEFS score will increase by 16 points indicating improved functional mobility Baseline: 19/80 Goal status: INITIAL  2.  Patient will be independent with home exercise program upon discharge from physical therapy Baseline:  Goal status: INITIAL  3.  Patient will be able to walk without assistive device and pain no more than 2/10 In the right hip community distances > 1000 feet  Baseline:  Goal status: INITIAL  4.  Patient will demonstrate a pain free squat in order to complete transfers repetitively and obtain items  below the waist for work and home tasks Baseline:  Goal status: INITIAL  5.  Patient will be able to demonstrate hip/knee/ankle strength to 5/5 in order to maximize functional mobility, ambulation and lifting.   Baseline:  Goal status: INITIAL  PLAN:  PT FREQUENCY: 2x/week  PT DURATION: 8 weeks  PLANNED INTERVENTIONS: [id number]- PT Re-evaluation, 97750- Physical Performance Testing, 97110-Therapeutic exercises, 97530- Therapeutic activity, [id number]- Neuromuscular re-education, 97535- Self Care, [id number]- Manual therapy, [phone]- Gait training, [phone]- Aquatic Therapy, [phone]- Electrical stimulation (unattended), Patient/Family education, Balance training, Stair training, Taping, DME instructions, Cryotherapy, and Moist heat  PLAN FOR NEXT SESSION: standing step ups, lateral and forward, hip abd . review home exercise program, should follow hip precautions until she sees MD 8/18.  manual therapy,    Jayvyn Haselton, PT 12/12/2023, 4:51 PM   Delon Norma, PT 12/12/23 4:51 PM Phone: [phone] Fax: [phone]

## 2023-12-12 ENCOUNTER — Ambulatory Visit: Admitting: Physical Therapy

## 2023-12-12 ENCOUNTER — Encounter: Payer: Self-pay | Admitting: Physical Therapy

## 2023-12-12 DIAGNOSIS — M6281 Muscle weakness (generalized): Secondary | ICD-10-CM

## 2023-12-12 DIAGNOSIS — M357 Hypermobility syndrome: Secondary | ICD-10-CM

## 2023-12-12 DIAGNOSIS — Z96641 Presence of right artificial hip joint: Secondary | ICD-10-CM

## 2023-12-12 DIAGNOSIS — M25551 Pain in right hip: Secondary | ICD-10-CM

## 2023-12-17 ENCOUNTER — Ambulatory Visit: Admitting: Physical Therapy

## 2023-12-17 ENCOUNTER — Encounter: Payer: Self-pay | Admitting: Physical Therapy

## 2023-12-17 DIAGNOSIS — E611 Iron deficiency: Secondary | ICD-10-CM | POA: Diagnosis not present

## 2023-12-17 DIAGNOSIS — D500 Iron deficiency anemia secondary to blood loss (chronic): Secondary | ICD-10-CM | POA: Diagnosis not present

## 2023-12-17 DIAGNOSIS — F332 Major depressive disorder, recurrent severe without psychotic features: Secondary | ICD-10-CM | POA: Diagnosis not present

## 2023-12-17 DIAGNOSIS — M357 Hypermobility syndrome: Secondary | ICD-10-CM

## 2023-12-17 DIAGNOSIS — D8940 Mast cell activation, unspecified: Secondary | ICD-10-CM | POA: Diagnosis not present

## 2023-12-17 DIAGNOSIS — M25551 Pain in right hip: Secondary | ICD-10-CM

## 2023-12-17 NOTE — Therapy (Signed)
 OUTPATIENT PHYSICAL THERAPY LOWER EXTREMITY NOTE    Patient Name: Brianna [name] MRN: [id number] DOB:11/12/1995, 28 y.o., female Today's Date: 12/17/2023  END OF SESSION:  PT End of Session - 12/17/23 0814     Visit Number --   arrrived, no charge   Number of Visits --    Date for PT Re-Evaluation --    Authorization Type --    PT Start Time 0815                Past Medical History:  Diagnosis Date   Acute recurrent pansinusitis 10/21/2019   ADHD (attention deficit hyperactivity disorder)    inattentive   Allergy    Anemia    had iron infusions in Fall of 2023   Anxiety    Arthritis    Joints, hips   Asthma    EIA   Bronchitis    Cat allergy due to both airborne and skin contact 05/01/2016   Chronic sore throat 05/01/2016   COVID 2020   Depression    Ehlers-Danlos syndrome    Fibromyalgia    pt denies   Gastroparesis    GERD (gastroesophageal reflux disease)    History of hypotension    taken midodrine  in the past- Spring of 2022   Hypermobile Ehlers-Danlos syndrome    Irregular heart rate    Keratosis pilaris    Migraine    w and w/o auras   PCOS (polycystic ovarian syndrome) 10/26/2022   PONV (postoperative nausea and vomiting)    I can get really cold   PTSD (post-traumatic stress disorder)    Seasonal allergic rhinitis 03/11/2014   Past Surgical History:  Procedure Laterality Date   CHOLECYSTECTOMY  05/09/2021   ESOPHAGOGASTRODUODENOSCOPY ENDOSCOPY     03/2020   HIP ARTHROPLASTY Right    HIP ARTHROSCOPY Right 04/25/2022   Procedure: RIGHT HIP ARTHROSCOPY WITH LABRAL RECONSTRUCTION, ILIOTIBIAL BAND ALLOGRAFT, PSOAS RELEASE;  Surgeon: Genelle Standing, MD;  Location: [facility] OR;  Service: Orthopedics;  Laterality: Right;   HIP SURGERY Right    per pt   JOINT REPLACEMENT     Upcoming THR right hip 09/03/23   LABRAL REPAIR Right 03/02/2021   Procedure: RIGHT HIP ARTHROSCOPY WITH LABRAL REPAIR AND PINCE DEBRIDEMENT;  Surgeon: Genelle Standing,  MD;  Location: [facility] OR;  Service: Orthopedics;  Laterality: Right;   SMART PILL PROCEDURE     12/2020   TYMPANOSTOMY TUBE PLACEMENT Bilateral    WISDOM TOOTH EXTRACTION     Patient Active Problem List   Diagnosis Date Noted   Chronic night sweats 08/21/2023   PCOS (polycystic ovarian syndrome) 10/26/2022   Cervical adenopathy 06/10/2021   Chronic pruritus 06/10/2021   Exercise-induced asthma 06/09/2021   Chronic venous insufficiency 06/03/2021   Orthostatic lightheadedness 06/03/2021   Purple toe syndrome of both feet (HCC) 06/03/2021   Autonomic dysfunction 06/03/2021   Tear of right acetabular labrum 04/13/2021   Migraine headache 03/01/2021   Headache 11/30/2020   Positive ANA (antinuclear antibody) 11/15/2020   Fibromyalgia 11/02/2020   Hair loss 11/02/2020   Muscle spasticity 10/22/2020   Hypermobile Ehlers-Danlos syndrome 10/22/2020   Paresthesia of bilateral legs 10/06/2020   Myalgia 10/06/2020   Chronic pelvic pain in female 10/06/2020   Chronic abdominal pain 10/06/2020   Irritable colon 08/02/2020   Pelvic floor dysfunction 08/02/2020   Irritant dermatitis 08/02/2020   Gastroesophageal reflux disease 06/28/2020   Severe recurrent major depression without psychotic features (HCC) 06/28/2020   Polyarthralgia 06/28/2020   COVID-19 long hauler  manifesting chronic palpitations 06/28/2020   Tachycardia 06/28/2020   History of COVID-19 06/28/2020   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 deficit 06/28/2020   Recurrent major depressive disorder, in full remission (HCC) 06/28/2020   Atypical chest pain 06/22/2020   Dizziness 06/22/2020   Palpitations 06/14/2020   Anxiety 05/04/2020   Internal and external hemorrhoids without complication 04/29/2020   Nondiabetic gastroparesis 03/03/2020   ETD (Eustachian tube dysfunction), bilateral 10/21/2019   Acne vulgaris 03/11/2014   Mild intermittent asthma without complication 03/11/2014   Seasonal allergies  03/11/2014    PCP: Wendolyn Jenkins Jansky MD  REFERRING PROVIDER: Ezzard Redell Ezzard MD  REFERRING DIAG: [phone] (ICD-10-CM) - 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 She heard 2 loud audible pops and is now having significant pain anterior hip/ groin pain with hip flexion. Cold pack applied, and pt encouraged to monitor symptoms and go to walk in ortho clinic if not improved in the next couple of days. Arrived/ No charge for today's visit.     Patient presents physical therapy evaluation status post right anterior hip replacement.  She has a history of 2 labral repairs in the past but after multiple surgical consultations it was found that her hip dysplasia was causing osteoarthritis and significant joint degeneration.  This is a sensitive topic for her because she has been dealing with this problem for several years and wonders how no one found hip dysplasia on her x-ray.  She opted for total hip replacement and overall is pleased with her progress.  She had home health physical therapy for 2 weeks following discharge from hospital.  Overall she has been keeping up with her exercises.  She walks with 1 crutch without incident but cannot limp the only thing I have difficulty with is putting on my socks.  She does have some issues with pain in her proximal anterior thigh and hip.  She has some numbness and tingling around the site.  She is planning to move back into her home this week which is on the first level.  She has not seen the  surgeon yet but has an appointment in a few weeks.  She was unclear about any precautions other than what her home health therapist gave her.   PERTINENT HISTORY: Ehlers-Danlos syndrome, symptoms of dysautonomia including palpitations dizziness and heat intolerance  chronic joint and muscle pain PAIN:  Are you having pain? Yes: NPRS scale: 7 /10  Pain location: Rt ant hip  Pain description: throbbing, achy  Aggravating factors: standing, walking  Relieving factors: Changing positions but something that helps the most is an ice pack which she uses frequently  PRECAUTIONS: Anterior hip patient would be wise to follow due to collagen defect due to hypermobile Ehlers-Danlos syndrome  RED FLAGS: None   WEIGHT BEARING RESTRICTIONS: No  FALLS:  Has patient fallen in last 6 months? No  LIVING ENVIRONMENT: Lives with: lives alone Lives in: House/apartment Stairs: No Has following equipment at home: Crutches shower chair   OCCUPATION: [employer] out until 8/11 .   PLOF: Independent  PATIENT GOALS: I want  to be walk pain free   NEXT MD VISIT: 7/18 [facility]   OBJECTIVE:  Note: Objective measures were completed at Evaluation unless otherwise noted.  DIAGNOSTIC FINDINGS: see chart ([facility])   PATIENT SURVEYS:   COGNITION: Overall cognitive status: Within functional limits for tasks assessed     SENSATION: Patient reports numbness and tingling near incision site and along the lateral thigh  EDEMA:  Minimal surrounding incision Patient points out a apparent deformity on the right lateral hip just distal to the greater trochanter.  She reports that was present before her total hip replacement but is questioning what it is it did feel a bit puffy to me but it could also be gluteus atrophy which makes it look a little bit more pronounced  POSTURE: No Significant postural limitations  PALPATION: Tender to palpation along  well-healed incision  LOWER EXTREMITY MNF:[facility] hip ER/IR    Active ROM Right eval Left eval  Hip flexion P90 P 120  Hip extension    Hip abduction    Hip adduction    Hip internal rotation A24,P25 A35, P30  Hip external rotation A3, P10 A30, P50   Knee flexion    Knee extension    Ankle dorsiflexion    Ankle plantarflexion    Ankle inversion    Ankle eversion     (Blank rows = not tested)  LOWER EXTREMITY MMT:  MMT Right eval Left eval  Hip flexion 4 5  Hip extension    Hip abduction    Hip adduction    Hip internal rotation 4 4  Hip external rotation 3+ 4  Knee flexion 4+ 5  Knee extension 5 5  Ankle dorsiflexion    Ankle plantarflexion    Ankle inversion    Ankle eversion     (Blank rows = not tested)    FUNCTIONAL TESTS:  5 times sit to stand: 16 seconds 30 seconds chair stand test, 9 repetitions  GAIT: Distance walked: 100 feet Assistive device utilized: Crutches x1 Level of assistance: Complete Independence Comments: Patient without appreciable limp for the first 75 feet, slower more cautious pace                                                                                                                                TREATMENT DATE:  [facility] Adult PT Treatment/ NO CHARGE                                      DATE: 12/17/23 Pt arrives reporting that she was running late and caught her right foot getting into her car. She heard 2 loud audible pops and is now having significant pain anterior hip/ groin pain with hip flexion. Cold pack applied, and pt encouraged to monitor symptoms and go to walk in ortho clinic if not improved in the next couple of days.  [facility] Adult PT Treatment:                                                DATE: 12/12/23 Therapeutic Exercise: Supine quad activation L LE with thigh supported Hamstring stretch  Leg lengthener Isometric extension of Rt hip x 10, 5 sec  Ball squeeze to bridge x 10  Quad stretch x 5, 30 sec Rt LE in prone with pillow under knees   Therapeutic  Activity: Pilates Reformer used for LE/core strength, postural strength, lumbopelvic disassociation and core control.  Exercises included: Footwork 2 red 1 blue parallel and ER narrow and wide Calf raises  Single leg 2 red 1 blue x 15 added alt knee/hip over/under bar  Cold pack 10 min Rt hip    [facility] Adult PT Treatment:                                                DATE: 12/10/23 Therapeutic Exercise: Recumbent bike L3, 6 min  Therapeutic Activity: Seated hip flexion with abduction (simulate driving motion of foot pedal)  Sidelying hip CARS Hip abduction Clam no band  Ball squeeze x 10 to bridge with ball x 10  Single leg bridge x 10 R/L  Quadruped hip hinge Quadruped hip extension  Glute med drops off yoga block  Hip abduction x 10  Hip hinge x 15 single leg, no hands for the last 5 reps  Manual :   PROM Rt hip with compression to glutes   Trigger point work to [employer] glute/piriformis    [facility] Adult PT Treatment:                                                DATE: 12/05/23 Therapeutic Exercise: Sit to stand no UE STS x 10 lbs  Sit to stand 10 lbs suitcase  Manual Therapy: STM to Rt ant lateral hip, thigh Scar tissue massage Rt hip   Therapeutic Activity: Gait no device, arm swing Added Rt UE holding 10 lbs  Hip abduction 3 lbs Rt LE 2 x 15 , each leg for WB and activity tolerance Step up x 15 each with opp hip flex   Modalities:  10 min Cold pack   [facility] Adult PT Treatment:                                                DATE: 12/03/23 Therapeutic Exercise: Recumbent bike 5 min L2  Hip ER/IR Knee to chest , circles with UE support  Hamstring stretch (dynamic)  Therapeutic Activity: Footwork Double leg Parallel heels, toes narrow and wide Pilates V heels and toes narrow and wide  2 red 1 blue  Single leg  Heel in parallel and turnout 2 red 1 blue  Bridging 4 springs x 10, 75% range  10 min cold pack Rt hip  [facility] Adult PT Treatment:  DATE: 11/30/23 Therapeutic Exercise: Recumbent bike 5 min L2  Bridging x 10 x 2 sets, legs on bolster Bridge with clam  Bridge with march  Knee to chest 30 sec x 3 using towel to assist  Hip IR and ER light supine  Hamstring dynamic stretch  Rt hip ER and IR in sidelying pillow between knees  Fire hydrant abd Therapeutic Activity: Standing walking no UEs Iso split lunge Rt LE FW 3 x 15 sec  Iso B stance hinge hold 3 x 15 sec  March x 10  March with red band x 10  Hip abduction red band x 10 each  Self Care: Driving restrictions  -discussed during therapeutic act Cold pack 10 min Rt hip   [facility] Adult PT Treatment:                                                DATE: 11/28/23 Therapeutic Activity: Bridge with yoga block x 10  Clam Green band x 15 Bridge with green band x 10  Hip flexion no band x to 90 deg High knee march  Hip abduction x 15  Gait in parallel bars 10 feet x 8 to normal pattern Squat (partial)  x 10 Heel raises x 15  Step ups 4 inch, then 6 inch x 10  Reverse step down 4 inch x 10  Lateral step up x 10 added hip abduction x 10    PATIENT EDUCATION:  Education details: hip precautions, POC, HEP  Person educated: Patient Education method: Programmer, multimedia, Demonstration, Verbal cues, and Handouts Education comprehension: verbalized understanding, returned demonstration, and needs further education  HOME EXERCISE PROGRAM: Access Code: [email or web address] URL: [email or web address] Date: 11/20/2023 Prepared by: Delon Norma  Exercises - Supine Quad Set  - 1 x daily - 7 x weekly - 2 sets - 10 reps - 30 hold - Supine Bridge  - 1 x daily - 7 x weekly - 2 sets - 10 reps - 30 hold (SMALL RANGE OF MOTION)  - Sit to Stand  - 1 x daily - 7 x weekly - 2 sets - 10 reps - 5 hold - Standing Hip Abduction with Counter Support  - 1 x daily - 7 x weekly - 2 sets - 10 reps - 5 hold (TOE FORWARD)  - Standing Marching  - 1 x daily - 7 x weekly - 2 sets - 10  reps - 5 hold  ASSESSMENT:  CLINICAL IMPRESSION:  Patient continues to have soreness in the right anterior hip with pain reported as 7/10.  Time spent on the Reformer today beneficial to load the joint with intention and safety.  She has good body awareness and alignment., needing only min cues for form today.  She may have an issue with financials once her plan starts over again in Aug. (Deductible) .  She has been driving and has been feeling good about that. Patient will continue to benefit from skilled PT in order to return to PLOF and optimize functional mobility.     OBJECTIVE IMPAIRMENTS: decreased activity tolerance, decreased endurance, decreased knowledge of use of DME, decreased mobility, difficulty walking, decreased ROM, decreased strength, increased edema, increased fascial restrictions, increased muscle spasms, impaired sensation, and pain.   ACTIVITY LIMITATIONS: carrying, lifting, bending, sitting, standing, squatting, sleeping, transfers, bed mobility, dressing, and locomotion level  PARTICIPATION LIMITATIONS: cleaning, driving, shopping, community  activity, and occupation  PERSONAL FACTORS: Past/current experiences, Transportation, and 1-2 comorbidities: EDS< previous hip surgeries are also affecting patient's functional outcome.   REHAB POTENTIAL: Excellent  CLINICAL DECISION MAKING: Evolving/moderate complexity  EVALUATION COMPLEXITY: Moderate   GOALS: Goals reviewed with patient? Yes  SHORT TERM GOALS: Target date: 12/18/2023   Patient will be able to show independence for initial HEP to include posture, core and hip strength and stability.   Baseline: Goal status: Met  2.  Patient will be I with concepts of joint protection, hip precautions and stability as it pertains to joint hypermobility. Baseline:  Goal status: Met  3.  Patient will be able to ambulate in the clinic without the use of crutch and no discernible limp 300 feet  Baseline:  Goal status: MET    LONG TERM GOALS: Target date: 01/15/2024    LEFS score will increase by 16 points indicating improved functional mobility Baseline: 19/80 Goal status: INITIAL  2.  Patient will be independent with home exercise program upon discharge from physical therapy Baseline:  Goal status: INITIAL  3.  Patient will be able to walk without assistive device and pain no more than 2/10 In the right hip community distances > 1000 feet  Baseline:  Goal status: INITIAL  4.  Patient will demonstrate a pain free squat in order to complete transfers repetitively and obtain items below the waist for work and home tasks Baseline:  Goal status: INITIAL  5.  Patient will be able to demonstrate hip/knee/ankle strength to 5/5 in order to maximize functional mobility, ambulation and lifting.   Baseline:  Goal status: INITIAL  PLAN:  PT FREQUENCY: 2x/week  PT DURATION: 8 weeks  PLANNED INTERVENTIONS: [id number]- PT Re-evaluation, 97750- Physical Performance Testing, 97110-Therapeutic exercises, 97530- Therapeutic activity, [id number]- Neuromuscular re-education, 97535- Self Care, [id number]- Manual therapy, [phone]- Gait training, [phone]- Aquatic Therapy, [phone]- Electrical stimulation (unattended), Patient/Family education, Balance training, Stair training, Taping, DME instructions, Cryotherapy, and Moist heat  PLAN FOR NEXT SESSION: standing step ups, lateral and forward, hip abd . review home exercise program, should follow hip precautions until she sees MD 8/18.  manual therapy    Harlene Persons, PTA 12/17/23 8:32 AM Phone: [phone] Fax: [phone]

## 2023-12-18 ENCOUNTER — Encounter

## 2023-12-19 NOTE — Therapy (Signed)
 OUTPATIENT PHYSICAL THERAPY LOWER EXTREMITY NOTE    Patient Name: Brianna [name] MRN: [id number] DOB:August 21, 1995, 28 y.o., female Today's Date: 12/20/2023  END OF SESSION:  PT End of Session - 12/20/23 1209     Visit Number 9    Number of Visits 16    Date for PT Re-Evaluation 01/15/24    Authorization Type BCBS    PT Start Time 1016    PT Stop Time 1112    PT Time Calculation (min) 56 min                 Past Medical History:  Diagnosis Date   Acute recurrent pansinusitis 10/21/2019   ADHD (attention deficit hyperactivity disorder)    inattentive   Allergy    Anemia    had iron infusions in Fall of 2023   Anxiety    Arthritis    Joints, hips   Asthma    EIA   Bronchitis    Cat allergy due to both airborne and skin contact 05/01/2016   Chronic sore throat 05/01/2016   COVID 2020   Depression    Ehlers-Danlos syndrome    Fibromyalgia    pt denies   Gastroparesis    GERD (gastroesophageal reflux disease)    History of hypotension    taken midodrine  in the past- Spring of 2022   Hypermobile Ehlers-Danlos syndrome    Irregular heart rate    Keratosis pilaris    Migraine    w and w/o auras   PCOS (polycystic ovarian syndrome) 10/26/2022   PONV (postoperative nausea and vomiting)    I can get really cold   PTSD (post-traumatic stress disorder)    Seasonal allergic rhinitis 03/11/2014   Past Surgical History:  Procedure Laterality Date   CHOLECYSTECTOMY  05/09/2021   ESOPHAGOGASTRODUODENOSCOPY ENDOSCOPY     03/2020   HIP ARTHROPLASTY Right    HIP ARTHROSCOPY Right 04/25/2022   Procedure: RIGHT HIP ARTHROSCOPY WITH LABRAL RECONSTRUCTION, ILIOTIBIAL BAND ALLOGRAFT, PSOAS RELEASE;  Surgeon: Genelle Standing, MD;  Location: [facility] OR;  Service: Orthopedics;  Laterality: Right;   HIP SURGERY Right    per pt   JOINT REPLACEMENT     Upcoming THR right hip 09/03/23   LABRAL REPAIR Right 03/02/2021   Procedure: RIGHT HIP ARTHROSCOPY WITH LABRAL REPAIR  AND PINCE DEBRIDEMENT;  Surgeon: Genelle Standing, MD;  Location: [facility] OR;  Service: Orthopedics;  Laterality: Right;   SMART PILL PROCEDURE     12/2020   TYMPANOSTOMY TUBE PLACEMENT Bilateral    WISDOM TOOTH EXTRACTION     Patient Active Problem List   Diagnosis Date Noted   Chronic night sweats 08/21/2023   PCOS (polycystic ovarian syndrome) 10/26/2022   Cervical adenopathy 06/10/2021   Chronic pruritus 06/10/2021   Exercise-induced asthma 06/09/2021   Chronic venous insufficiency 06/03/2021   Orthostatic lightheadedness 06/03/2021   Purple toe syndrome of both feet (HCC) 06/03/2021   Autonomic dysfunction 06/03/2021   Tear of right acetabular labrum 04/13/2021   Migraine headache 03/01/2021   Headache 11/30/2020   Positive ANA (antinuclear antibody) 11/15/2020   Fibromyalgia 11/02/2020   Hair loss 11/02/2020   Muscle spasticity 10/22/2020   Hypermobile Ehlers-Danlos syndrome 10/22/2020   Paresthesia of bilateral legs 10/06/2020   Myalgia 10/06/2020   Chronic pelvic pain in female 10/06/2020   Chronic abdominal pain 10/06/2020   Irritable colon 08/02/2020   Pelvic floor dysfunction 08/02/2020   Irritant dermatitis 08/02/2020   Gastroesophageal reflux disease 06/28/2020   Severe recurrent major depression without  psychotic features (HCC) 06/28/2020   Polyarthralgia 06/28/2020   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 06/28/2020   Tachycardia 06/28/2020   History of COVID-19 06/28/2020   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 deficit 06/28/2020   Recurrent major depressive disorder, in full remission (HCC) 06/28/2020   Atypical chest pain 06/22/2020   Dizziness 06/22/2020   Palpitations 06/14/2020   Anxiety 05/04/2020   Internal and external hemorrhoids without complication 04/29/2020   Nondiabetic gastroparesis 03/03/2020   ETD (Eustachian tube dysfunction), bilateral 10/21/2019   Acne vulgaris 03/11/2014   Mild intermittent asthma without  complication 03/11/2014   Seasonal allergies 03/11/2014    PCP: Wendolyn Jenkins Jansky MD  REFERRING PROVIDER: Ezzard Redell Ezzard MD  REFERRING DIAG: [phone] (ICD-10-CM) - 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 I went to [facility] and saw a PA and they did imaging and stated the hip looks good and that I should have notified my surgeon of what was going on.    Patient presents physical therapy evaluation status post right anterior hip replacement.  She has a history of 2 labral repairs in the past but after multiple surgical consultations it was found that her hip dysplasia was causing osteoarthritis and significant joint degeneration.  This is a sensitive topic for her because she has been dealing with this problem for several years and wonders how no one found hip dysplasia on her x-ray.  She opted for total hip replacement and overall is pleased with her progress.  She had home health physical therapy for 2 weeks following discharge from hospital.  Overall she has been keeping up with her exercises.  She walks with 1 crutch without incident but cannot limp the only thing I have difficulty with is putting on my socks.  She does have some issues with pain in her proximal anterior thigh and hip.  She has some numbness and tingling around the site.  She is planning to move back into her home this  week which is on the first level.  She has not seen the surgeon yet but has an appointment in a few weeks.  She was unclear about any precautions other than what her home health therapist gave her.   PERTINENT HISTORY: Ehlers-Danlos syndrome, symptoms of dysautonomia including palpitations dizziness and heat intolerance  chronic joint and muscle pain PAIN:  Are you having pain? Yes: NPRS scale: 6 -7/10  Pain location: Rt ant hip  Pain description: throbbing, achy  Aggravating factors: standing, walking  Relieving factors: Changing positions but something that helps the most is an ice pack which she uses frequently  PRECAUTIONS: Anterior hip patient would be wise to follow due to collagen defect due to hypermobile Ehlers-Danlos syndrome  RED FLAGS: None   WEIGHT BEARING RESTRICTIONS: No  FALLS:  Has patient fallen in last 6 months? No  LIVING ENVIRONMENT: Lives with: lives alone Lives in: House/apartment Stairs: No Has following equipment  at home: Crutches shower chair   OCCUPATION: [employer] out until 8/11 .   PLOF: Independent  PATIENT GOALS: I want to be walk pain free   NEXT MD VISIT: 7/18 [facility]   OBJECTIVE:  Note: Objective measures were completed at Evaluation unless otherwise noted.  DIAGNOSTIC FINDINGS: see chart ([facility])   PATIENT SURVEYS:   COGNITION: Overall cognitive status: Within functional limits for tasks assessed     SENSATION: Patient reports numbness and tingling near incision site and along the lateral thigh  EDEMA:  Minimal surrounding incision Patient points out a apparent deformity on the right lateral hip just distal to the greater trochanter.  She reports that was present before her total hip replacement but is questioning what it is it did feel a bit puffy to me but it could also be gluteus atrophy which makes it look a little bit more pronounced  POSTURE: No Significant postural limitations  PALPATION: Tender to palpation along   well-healed incision  LOWER EXTREMITY MNF:[facility] hip ER/IR   Active ROM Right eval Left eval  Hip flexion P90 P 120  Hip extension    Hip abduction    Hip adduction    Hip internal rotation A24,P25 A35, P30  Hip external rotation A3, P10 A30, P50   Knee flexion    Knee extension    Ankle dorsiflexion    Ankle plantarflexion    Ankle inversion    Ankle eversion     (Blank rows = not tested)  LOWER EXTREMITY MMT:  MMT Right eval Left eval  Hip flexion 4 5  Hip extension    Hip abduction    Hip adduction    Hip internal rotation 4 4  Hip external rotation 3+ 4  Knee flexion 4+ 5  Knee extension 5 5  Ankle dorsiflexion    Ankle plantarflexion    Ankle inversion    Ankle eversion     (Blank rows = not tested)    FUNCTIONAL TESTS:  5 times sit to stand: 16 seconds 30 seconds chair stand test, 9 repetitions  GAIT: Distance walked: 100 feet Assistive device utilized: Crutches x1 Level of assistance: Complete Independence Comments: Patient without appreciable limp for the first 75 feet, slower more cautious pace                                                                                                                                TREATMENT DATE:  [facility] Adult PT Treatment:                                                DATE: 12/20/23 MTPR along the distal iliopsoas and proximal to mid rectus femoris Grade I LAD with oscillations  Hip flexion with grade I-II hip distraction Standing r hip flexor stretch with glute  activation cues to avoid excessive/ over stretching MHP x 13 min in supine with bolster under knees.  [facility] Adult PT Treatment/ NO CHARGE                                      DATE: 12/17/23 Pt arrives reporting that she was running late and caught her right foot getting into her car. She heard 2 loud audible pops and is now having significant pain anterior hip/ groin pain with hip flexion. Cold pack applied, and pt encouraged to monitor symptoms and go  to walk in ortho clinic if not improved in the next couple of days.   [facility] Adult PT Treatment:                                                DATE: 12/12/23 Therapeutic Exercise: Supine quad activation L LE with thigh supported Hamstring stretch  Leg lengthener Isometric extension of Rt hip x 10, 5 sec  Ball squeeze to bridge x 10  Quad stretch x 5, 30 sec Rt LE in prone with pillow under knees   Therapeutic Activity: Pilates Reformer used for LE/core strength, postural strength, lumbopelvic disassociation and core control.  Exercises included: Footwork 2 red 1 blue parallel and ER narrow and wide Calf raises  Single leg 2 red 1 blue x 15 added alt knee/hip over/under bar  Cold pack 10 min Rt hip    [facility] Adult PT Treatment:                                                DATE: 12/10/23 Therapeutic Exercise: Recumbent bike L3, 6 min  Therapeutic Activity: Seated hip flexion with abduction (simulate driving motion of foot pedal)  Sidelying hip CARS Hip abduction Clam no band  Ball squeeze x 10 to bridge with ball x 10  Single leg bridge x 10 R/L  Quadruped hip hinge Quadruped hip extension  Glute med drops off yoga block  Hip abduction x 10  Hip hinge x 15 single leg, no hands for the last 5 reps  Manual :   PROM Rt hip with compression to glutes   Trigger point work to [employer] glute/piriformis    [facility] Adult PT Treatment:                                                DATE: 12/05/23 Therapeutic Exercise: Sit to stand no UE STS x 10 lbs  Sit to stand 10 lbs suitcase  Manual Therapy: STM to Rt ant lateral hip, thigh Scar tissue massage Rt hip   Therapeutic Activity: Gait no device, arm swing Added Rt UE holding 10 lbs  Hip abduction 3 lbs Rt LE 2 x 15 , each leg for WB and activity tolerance Step up x 15 each with opp hip flex   Modalities:  10 min Cold pack      PATIENT EDUCATION:  Education details: hip precautions, POC, HEP  Person educated: Patient Education  method: Programmer, multimedia, Facilities manager, Verbal  cues, and Handouts Education comprehension: verbalized understanding, returned demonstration, and needs further education  HOME EXERCISE PROGRAM: Access Code: [email or web address] URL: [email or web address] Date: 11/20/2023 Prepared by: Delon Norma  Exercises - Supine Quad Set  - 1 x daily - 7 x weekly - 2 sets - 10 reps - 30 hold - Supine Bridge  - 1 x daily - 7 x weekly - 2 sets - 10 reps - 30 hold (SMALL RANGE OF MOTION)  - Sit to Stand  - 1 x daily - 7 x weekly - 2 sets - 10 reps - 5 hold - Standing Hip Abduction with Counter Support  - 1 x daily - 7 x weekly - 2 sets - 10 reps - 5 hold (TOE FORWARD)  - Standing Marching  - 1 x daily - 7 x weekly - 2 sets - 10 reps - 5 hold  ASSESSMENT:  CLINICAL IMPRESSION:  Mrs Sulser reports continued pain in the proximal anterior aspect of the hip with the majority along the anterior medial aspect. She reported this occurred on Monday getting into her vehicle and caught her foot resulting in 2 pops in the front of her hip with pain. Further assessment revealed concordant pain with rectus femoris and iliopsoas activation but no pain with PROM hip flexion  suggesting a high likelihood of potential iliopsoas strain taking into account the MOI and the report of pops/ pain with limited active hip flexion and no pain with PROM. Todays session focused on modulating pain and relieving tension along the hip flexors. She responded well to STW today and noted pain had dropped from a 6-7/10 to a 3-4/10 at the end of the session. Addressed pt specific questions throughout session.   OBJECTIVE IMPAIRMENTS: decreased activity tolerance, decreased endurance, decreased knowledge of use of DME, decreased mobility, difficulty walking, decreased ROM, decreased strength, increased edema, increased fascial restrictions, increased muscle spasms, impaired sensation, and pain.   ACTIVITY LIMITATIONS: carrying, lifting,  bending, sitting, standing, squatting, sleeping, transfers, bed mobility, dressing, and locomotion level  PARTICIPATION LIMITATIONS: cleaning, driving, shopping, community activity, and occupation  PERSONAL FACTORS: Past/current experiences, Transportation, and 1-2 comorbidities: EDS< previous hip surgeries are also affecting patient's functional outcome.   REHAB POTENTIAL: Excellent  CLINICAL DECISION MAKING: Evolving/moderate complexity  EVALUATION COMPLEXITY: Moderate   GOALS: Goals reviewed with patient? Yes  SHORT TERM GOALS: Target date: 12/18/2023   Patient will be able to show independence for initial HEP to include posture, core and hip strength and stability.   Baseline: Goal status: Met  2.  Patient will be I with concepts of joint protection, hip precautions and stability as it pertains to joint hypermobility. Baseline:  Goal status: Met  3.  Patient will be able to ambulate in the clinic without the use of crutch and no discernible limp 300 feet  Baseline:  Goal status: MET   LONG TERM GOALS: Target date: 01/15/2024    LEFS score will increase by 16 points indicating improved functional mobility Baseline: 19/80 Goal status: INITIAL  2.  Patient will be independent with home exercise program upon discharge from physical therapy Baseline:  Goal status: INITIAL  3.  Patient will be able to walk without assistive device and pain no more than 2/10 In the right hip community distances > 1000 feet  Baseline:  Goal status: INITIAL  4.  Patient will demonstrate a pain free squat in order to complete transfers repetitively and obtain items below the waist for work and home tasks Baseline:  Goal status:  INITIAL  5.  Patient will be able to demonstrate hip/knee/ankle strength to 5/5 in order to maximize functional mobility, ambulation and lifting.   Baseline:  Goal status: INITIAL  PLAN:  PT FREQUENCY: 2x/week  PT DURATION: 8 weeks  PLANNED INTERVENTIONS:  97164- PT Re-evaluation, 97750- Physical Performance Testing, 97110-Therapeutic exercises, 97530- Therapeutic activity, [id number]- Neuromuscular re-education, 97535- Self Care, [id number]- Manual therapy, [phone]- Gait training, [phone]- Aquatic Therapy, [phone]- Electrical stimulation (unattended), Patient/Family education, Balance training, Stair training, Taping, DME instructions, Cryotherapy, and Moist heat  PLAN FOR NEXT SESSION: standing step ups, lateral and forward, hip abd . review home exercise program, should follow hip precautions until she sees MD 8/18.  manual therapy    Milam Allbaugh PT, DPT, LAT, ATC  12/20/23  12:21 PM

## 2023-12-20 ENCOUNTER — Encounter: Payer: Self-pay | Admitting: Physical Therapy

## 2023-12-20 ENCOUNTER — Ambulatory Visit (HOSPITAL_BASED_OUTPATIENT_CLINIC_OR_DEPARTMENT_OTHER): Admitting: Physician Assistant

## 2023-12-20 ENCOUNTER — Ambulatory Visit (HOSPITAL_BASED_OUTPATIENT_CLINIC_OR_DEPARTMENT_OTHER)

## 2023-12-20 ENCOUNTER — Encounter (HOSPITAL_BASED_OUTPATIENT_CLINIC_OR_DEPARTMENT_OTHER): Payer: Self-pay | Admitting: Physician Assistant

## 2023-12-20 ENCOUNTER — Ambulatory Visit: Payer: Self-pay | Admitting: Physical Therapy

## 2023-12-20 DIAGNOSIS — M6281 Muscle weakness (generalized): Secondary | ICD-10-CM | POA: Diagnosis not present

## 2023-12-20 DIAGNOSIS — R002 Palpitations: Secondary | ICD-10-CM | POA: Diagnosis not present

## 2023-12-20 DIAGNOSIS — F332 Major depressive disorder, recurrent severe without psychotic features: Secondary | ICD-10-CM | POA: Diagnosis not present

## 2023-12-20 DIAGNOSIS — Z96641 Presence of right artificial hip joint: Secondary | ICD-10-CM | POA: Diagnosis not present

## 2023-12-20 DIAGNOSIS — F411 Generalized anxiety disorder: Secondary | ICD-10-CM | POA: Diagnosis not present

## 2023-12-20 DIAGNOSIS — M25551 Pain in right hip: Secondary | ICD-10-CM

## 2023-12-20 DIAGNOSIS — F4312 Post-traumatic stress disorder, chronic: Secondary | ICD-10-CM | POA: Diagnosis not present

## 2023-12-20 DIAGNOSIS — M357 Hypermobility syndrome: Secondary | ICD-10-CM | POA: Diagnosis not present

## 2023-12-20 NOTE — Progress Notes (Signed)
 Office Visit Note   Patient: Brianna [name]           Date of Birth: [birth year]           MRN: [id number] Visit Date: 12/20/2023              Requested by: Wendolyn Jenkins Jansky, MD [address] [place],  [place] [address] PCP: Wendolyn Jenkins Jansky, MD   Assessment & Plan: Visit Diagnoses:  1. Pain in right hip     Plan: Patient is a 28 year old woman comes in today with right groin pain.  She is approximately 2 months status post anterior right total hip arthroplasty with Dr. Ezzard at [facility].  She states that 3 days ago she was stepping up into a car and caught her foot and had resisted flexion which produced a pop in her groin.  X-rays do not show any evidence of dislocation or evidence of evidence of other osseous injuries.  She has a right hip replacement in good position without any evidence of trauma.  Based on the exam today I discussed with her that the most likely cause would be a strained hip flexor or just her breaking through some scar tissue from her resisted range of motion and this is very common.  I did encourage her to follow-up with her surgeon at [facility].  In the meantime she understands this is may take a bit to get better but it should get better.  She continue with physical therapy.  Follow-up with her operative surgeon  Follow-Up Instructions: As needed.  With Dr. Ezzard for her hip  Orders:  Orders Placed This Encounter  Procedures   DG Hip Unilat W OR W/O Pelvis Min 4 Views Right   No orders of the defined types were placed in this encounter.     Procedures: No procedures performed   Clinical Data: No additional findings.   Subjective: Chief Complaint  Patient presents with   Right Hip - Pain    HPI patient is a 28 year old woman who is 2 months status post right total hip arthroplasty done at [facility] by Dr. Redell Ezzard.  She had been previously seen and had to hip arthroscopies without relief in her symptoms she comes in today because she  heard something pop on 728 in the groin area pain radiates down the right leg she notices she has decreased range of motion no paresthesias she has been using ice and ibuprofen  have been doing physical therapy.  I suspect this will get better as time goes on.  Gave her return precautions  Review of Systems  All other systems reviewed and are negative.    Objective: Vital Signs: There were no vitals taken for this visit.  Physical Exam Constitutional:      Appearance: Normal appearance.  Pulmonary:     Effort: Pulmonary effort is normal.  Skin:    General: Skin is warm and dry.  Neurological:     Mental Status: She is alert.  Psychiatric:        Mood and Affect: Mood normal.        Behavior: Behavior normal.     Ortho Exam Examination she walks with a slight antalgic gait.  Examination of her right hip she has good motion is somewhat painful but has good range of motion of her right hip has reproducible pain in the groin with resisted flexion.  She is neurovascularly intact she has good flexion and extension compartments are soft and nontender  no erythema or cellulitis negative [place]' sign Specialty Comments:  No specialty comments available.  Imaging: No results found.   PMFS History: Patient Active Problem List   Diagnosis Date Noted   Chronic night sweats 08/21/2023   PCOS (polycystic ovarian syndrome) 10/26/2022   Cervical adenopathy 06/10/2021   Chronic pruritus 06/10/2021   Exercise-induced asthma 06/09/2021   Chronic venous insufficiency 06/03/2021   Orthostatic lightheadedness 06/03/2021   Purple toe syndrome of both feet (HCC) 06/03/2021   Autonomic dysfunction 06/03/2021   Tear of right acetabular labrum 04/13/2021   Migraine headache 03/01/2021   Headache 11/30/2020   Positive ANA (antinuclear antibody) 11/15/2020   Fibromyalgia 11/02/2020   Hair loss 11/02/2020   Muscle spasticity 10/22/2020   Hypermobile Ehlers-Danlos syndrome 10/22/2020    Paresthesia of bilateral legs 10/06/2020   Myalgia 10/06/2020   Chronic pelvic pain in female 10/06/2020   Chronic abdominal pain 10/06/2020   Irritable colon 08/02/2020   Pelvic floor dysfunction 08/02/2020   Irritant dermatitis 08/02/2020   Gastroesophageal reflux disease 06/28/2020   Severe recurrent major depression without psychotic features (HCC) 06/28/2020   Polyarthralgia 06/28/2020   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 06/28/2020   Tachycardia 06/28/2020   History of COVID-19 06/28/2020   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 deficit 06/28/2020   Recurrent major depressive disorder, in full remission (HCC) 06/28/2020   Atypical chest pain 06/22/2020   Dizziness 06/22/2020   Palpitations 06/14/2020   Anxiety 05/04/2020   Internal and external hemorrhoids without complication 04/29/2020   Nondiabetic gastroparesis 03/03/2020   ETD (Eustachian tube dysfunction), bilateral 10/21/2019   Acne vulgaris 03/11/2014   Mild intermittent asthma without complication 03/11/2014   Seasonal allergies 03/11/2014   Past Medical History:  Diagnosis Date   Acute recurrent pansinusitis 10/21/2019   ADHD (attention deficit hyperactivity disorder)    inattentive   Allergy    Anemia    had iron infusions in Fall of 2023   Anxiety    Arthritis    Joints, hips   Asthma    EIA   Bronchitis    Cat allergy due to both airborne and skin contact 05/01/2016   Chronic sore throat 05/01/2016   COVID 2020   Depression    Ehlers-Danlos syndrome    Fibromyalgia    pt denies   Gastroparesis    GERD (gastroesophageal reflux disease)    History of hypotension    taken midodrine  in the past- Spring of 2022   Hypermobile Ehlers-Danlos syndrome    Irregular heart rate    Keratosis pilaris    Migraine    w and w/o auras   PCOS (polycystic ovarian syndrome) 10/26/2022   PONV (postoperative nausea and vomiting)    I can get really cold   PTSD (post-traumatic  stress disorder)    Seasonal allergic rhinitis 03/11/2014    Family History  Problem Relation Age of Onset   Diabetes Mother    Polycystic ovary syndrome Mother    Anxiety disorder Mother    Arthritis Mother    Kidney disease Mother    Obesity Mother    GER disease Father    Arthritis Father    Hypertension Father    ADD / ADHD Brother    Obesity Brother    Heart disease Paternal Uncle    COPD Maternal Grandmother    Cancer Paternal Aunt     Past Surgical History:  Procedure Laterality Date   CHOLECYSTECTOMY  05/09/2021   ESOPHAGOGASTRODUODENOSCOPY ENDOSCOPY  03/2020   HIP ARTHROPLASTY Right    HIP ARTHROSCOPY Right 04/25/2022   Procedure: RIGHT HIP ARTHROSCOPY WITH LABRAL RECONSTRUCTION, ILIOTIBIAL BAND ALLOGRAFT, PSOAS RELEASE;  Surgeon: Genelle Standing, MD;  Location: [facility] OR;  Service: Orthopedics;  Laterality: Right;   HIP SURGERY Right    per pt   JOINT REPLACEMENT     Upcoming THR right hip 09/03/23   LABRAL REPAIR Right 03/02/2021   Procedure: RIGHT HIP ARTHROSCOPY WITH LABRAL REPAIR AND PINCE DEBRIDEMENT;  Surgeon: Genelle Standing, MD;  Location: [facility] OR;  Service: Orthopedics;  Laterality: Right;   SMART PILL PROCEDURE     12/2020   TYMPANOSTOMY TUBE PLACEMENT Bilateral    WISDOM TOOTH EXTRACTION     Social History   Occupational History   Not on file  Tobacco Use   Smoking status: Former    Current packs/day: 0.00    Types: Cigarettes, E-cigarettes    Quit date: 2021    Years since quitting: 4.5   Smokeless tobacco: Never   Tobacco comments:    Have not used in 4 years  Vaping Use   Vaping status: Former   Substances: Financial trader  Substance and Sexual Activity   Alcohol use: Not Currently   Drug use: Not Currently    Types: Marijuana   Sexual activity: Not on file

## 2023-12-24 ENCOUNTER — Encounter: Payer: Self-pay | Admitting: Physical Therapy

## 2023-12-24 ENCOUNTER — Ambulatory Visit: Payer: Self-pay | Attending: Orthopedic Surgery | Admitting: Physical Therapy

## 2023-12-24 DIAGNOSIS — M25551 Pain in right hip: Secondary | ICD-10-CM | POA: Diagnosis not present

## 2023-12-24 DIAGNOSIS — M6281 Muscle weakness (generalized): Secondary | ICD-10-CM | POA: Insufficient documentation

## 2023-12-24 DIAGNOSIS — M357 Hypermobility syndrome: Secondary | ICD-10-CM | POA: Insufficient documentation

## 2023-12-24 DIAGNOSIS — Z96641 Presence of right artificial hip joint: Secondary | ICD-10-CM | POA: Insufficient documentation

## 2023-12-24 DIAGNOSIS — F332 Major depressive disorder, recurrent severe without psychotic features: Secondary | ICD-10-CM | POA: Diagnosis not present

## 2023-12-24 NOTE — Therapy (Signed)
 OUTPATIENT PHYSICAL THERAPY LOWER EXTREMITY NOTE    Patient Name: Bellamarie Pflug MRN: [id number] DOB:05-31-1995, 28 y.o., female Today's Date: 12/24/2023  END OF SESSION:  PT End of Session - 12/24/23 1421     Visit Number 10    Number of Visits 16    Date for PT Re-Evaluation 01/15/24    Authorization Type [employer]    PT Start Time 1417    PT Stop Time 1504    PT Time Calculation (min) 47 min    Activity Tolerance Patient tolerated treatment well    Behavior During Therapy [facility] for tasks assessed/performed                 Past Medical History:  Diagnosis Date   Acute recurrent pansinusitis 10/21/2019   ADHD (attention deficit hyperactivity disorder)    inattentive   Allergy    Anemia    had iron infusions in Fall of 2023   Anxiety    Arthritis    Joints, hips   Asthma    EIA   Bronchitis    Cat allergy due to both airborne and skin contact 05/01/2016   Chronic sore throat 05/01/2016   COVID 2020   Depression    Ehlers-Danlos syndrome    Fibromyalgia    pt denies   Gastroparesis    GERD (gastroesophageal reflux disease)    History of hypotension    taken midodrine  in the past- Spring of 2022   Hypermobile Ehlers-Danlos syndrome    Irregular heart rate    Keratosis pilaris    Migraine    w and w/o auras   PCOS (polycystic ovarian syndrome) 10/26/2022   PONV (postoperative nausea and vomiting)    I can get really cold   PTSD (post-traumatic stress disorder)    Seasonal allergic rhinitis 03/11/2014   Past Surgical History:  Procedure Laterality Date   CHOLECYSTECTOMY  05/09/2021   ESOPHAGOGASTRODUODENOSCOPY ENDOSCOPY     03/2020   HIP ARTHROPLASTY Right    HIP ARTHROSCOPY Right 04/25/2022   Procedure: RIGHT HIP ARTHROSCOPY WITH LABRAL RECONSTRUCTION, ILIOTIBIAL BAND ALLOGRAFT, PSOAS RELEASE;  Surgeon: Genelle Standing, MD;  Location: [facility] OR;  Service: Orthopedics;  Laterality: Right;   HIP SURGERY Right    per pt   JOINT REPLACEMENT      Upcoming THR right hip 09/03/23   LABRAL REPAIR Right 03/02/2021   Procedure: RIGHT HIP ARTHROSCOPY WITH LABRAL REPAIR AND PINCE DEBRIDEMENT;  Surgeon: Genelle Standing, MD;  Location: [facility] OR;  Service: Orthopedics;  Laterality: Right;   SMART PILL PROCEDURE     12/2020   TYMPANOSTOMY TUBE PLACEMENT Bilateral    WISDOM TOOTH EXTRACTION     Patient Active Problem List   Diagnosis Date Noted   Chronic night sweats 08/21/2023   PCOS (polycystic ovarian syndrome) 10/26/2022   Cervical adenopathy 06/10/2021   Chronic pruritus 06/10/2021   Exercise-induced asthma 06/09/2021   Chronic venous insufficiency 06/03/2021   Orthostatic lightheadedness 06/03/2021   Purple toe syndrome of both feet (HCC) 06/03/2021   Autonomic dysfunction 06/03/2021   Tear of right acetabular labrum 04/13/2021   Migraine headache 03/01/2021   Headache 11/30/2020   Positive ANA (antinuclear antibody) 11/15/2020   Fibromyalgia 11/02/2020   Hair loss 11/02/2020   Muscle spasticity 10/22/2020   Hypermobile Ehlers-Danlos syndrome 10/22/2020   Paresthesia of bilateral legs 10/06/2020   Myalgia 10/06/2020   Chronic pelvic pain in female 10/06/2020   Chronic abdominal pain 10/06/2020   Irritable colon 08/02/2020   Pelvic floor dysfunction  08/02/2020   Irritant dermatitis 08/02/2020   Gastroesophageal reflux disease 06/28/2020   Severe recurrent major depression without psychotic features (HCC) 06/28/2020   Polyarthralgia 06/28/2020   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 06/28/2020   Tachycardia 06/28/2020   History of COVID-19 06/28/2020   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 deficit 06/28/2020   Recurrent major depressive disorder, in full remission (HCC) 06/28/2020   Atypical chest pain 06/22/2020   Dizziness 06/22/2020   Palpitations 06/14/2020   Anxiety 05/04/2020   Internal and external hemorrhoids without complication 04/29/2020   Nondiabetic gastroparesis 03/03/2020    ETD (Eustachian tube dysfunction), bilateral 10/21/2019   Acne vulgaris 03/11/2014   Mild intermittent asthma without complication 03/11/2014   Seasonal allergies 03/11/2014    PCP: Wendolyn Jenkins Jansky MD  REFERRING PROVIDER: Ezzard Redell Ezzard MD  REFERRING DIAG: [phone] (ICD-10-CM) - 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 I went to [facility] walking clinic and saw a PA and they did imaging and stated the hip looks good and that I should have notified my surgeon of what was going on.    Patient presents physical therapy evaluation status post right anterior hip replacement.  She has a history of 2 labral repairs in the past but after multiple surgical consultations it was found that her hip dysplasia was causing osteoarthritis and significant joint degeneration.  This is a sensitive topic for her because she has been dealing with this problem for several years and wonders how no one found hip dysplasia on her x-ray.  She opted for total hip replacement and overall is pleased with her progress.  She had home health physical therapy for 2 weeks following discharge from hospital.  Overall she has been keeping up with her exercises.  She walks with 1 crutch without incident but cannot limp the only thing I have difficulty with is putting on my socks.  She does have some issues with pain in her proximal anterior  thigh and hip.  She has some numbness and tingling around the site.  She is planning to move back into her home this week which is on the first level.  She has not seen the surgeon yet but has an appointment in a few weeks.  She was unclear about any precautions other than what her home health therapist gave her.   PERTINENT HISTORY: Ehlers-Danlos syndrome, symptoms of dysautonomia including palpitations dizziness and heat intolerance  chronic joint and muscle pain PAIN:  Are you having pain? Yes: NPRS scale: 6 -7/10  Pain location: Rt ant hip  Pain description: throbbing, achy  Aggravating factors: standing, walking  Relieving factors: Changing positions but something that helps the most is an ice pack which she uses frequently  PRECAUTIONS: Anterior hip patient would be wise to follow due to collagen defect due to hypermobile Ehlers-Danlos syndrome  RED FLAGS: None   WEIGHT BEARING RESTRICTIONS: No  FALLS:  Has patient fallen in  last 6 months? No  LIVING ENVIRONMENT: Lives with: lives alone Lives in: House/apartment Stairs: No Has following equipment at home: Crutches shower chair   OCCUPATION: [employer] out until 8/11 .   PLOF: Independent  PATIENT GOALS: I want to be walk pain free   NEXT MD VISIT: 7/18 [facility]   OBJECTIVE:  Note: Objective measures were completed at Evaluation unless otherwise noted.  DIAGNOSTIC FINDINGS: see chart ([facility])   PATIENT SURVEYS:   COGNITION: Overall cognitive status: Within functional limits for tasks assessed     SENSATION: Patient reports numbness and tingling near incision site and along the lateral thigh  EDEMA:  Minimal surrounding incision Patient points out a apparent deformity on the right lateral hip just distal to the greater trochanter.  She reports that was present before her total hip replacement but is questioning what it is it did feel a bit puffy to me but it could also be gluteus atrophy which makes it look a little  bit more pronounced  POSTURE: No Significant postural limitations  PALPATION: Tender to palpation along  well-healed incision  LOWER EXTREMITY MNF:[facility] hip ER/IR   Active ROM Right eval Left eval  Hip flexion P90 P 120  Hip extension    Hip abduction    Hip adduction    Hip internal rotation A24,P25 A35, P30  Hip external rotation A3, P10 A30, P50   Knee flexion    Knee extension    Ankle dorsiflexion    Ankle plantarflexion    Ankle inversion    Ankle eversion     (Blank rows = not tested)  LOWER EXTREMITY MMT:  MMT Right eval Left eval  Hip flexion 4 5  Hip extension    Hip abduction    Hip adduction    Hip internal rotation 4 4  Hip external rotation 3+ 4  Knee flexion 4+ 5  Knee extension 5 5  Ankle dorsiflexion    Ankle plantarflexion    Ankle inversion    Ankle eversion     (Blank rows = not tested)    FUNCTIONAL TESTS:  5 times sit to stand: 16 seconds 30 seconds chair stand test, 9 repetitions  GAIT: Distance walked: 100 feet Assistive device utilized: Crutches x1 Level of assistance: Complete Independence Comments: Patient without appreciable limp for the first 75 feet, slower more cautious pace                                                                                                                                TREATMENT DATE:  [facility] Adult PT Treatment:                                                DATE: 12/24/23 MTPR along distal iliopsoas, TFL  STW along glute med Supine hip  flexor stretch 2 x 30 sec LAD grade I-II with oscillations Heel slides 1 x 8 Supine marching 1 x 10 marching with heel raise progressed to 1 x 8 light hip flexor Bridge bil LE 2 x 10  Adjusted seating in vehicle to maximize function/ reduce stress.   [facility] Adult PT Treatment:                                                DATE: 12/20/23 MTPR along the distal iliopsoas and proximal to mid rectus femoris Grade I LAD with oscillations  Hip flexion with grade  I-II hip distraction Standing r hip flexor stretch with glute activation cues to avoid excessive/ over stretching MHP x 13 min in supine with bolster under knees.  [facility] Adult PT Treatment/ NO CHARGE                                      DATE: 12/17/23 Pt arrives reporting that she was running late and caught her right foot getting into her car. She heard 2 loud audible pops and is now having significant pain anterior hip/ groin pain with hip flexion. Cold pack applied, and pt encouraged to monitor symptoms and go to walk in ortho clinic if not improved in the next couple of days.   [facility] Adult PT Treatment:                                                DATE: 12/12/23 Therapeutic Exercise: Supine quad activation L LE with thigh supported Hamstring stretch  Leg lengthener Isometric extension of Rt hip x 10, 5 sec  Ball squeeze to bridge x 10  Quad stretch x 5, 30 sec Rt LE in prone with pillow under knees   Therapeutic Activity: Pilates Reformer used for LE/core strength, postural strength, lumbopelvic disassociation and core control.  Exercises included: Footwork 2 red 1 blue parallel and ER narrow and wide Calf raises  Single leg 2 red 1 blue x 15 added alt knee/hip over/under bar  Cold pack 10 min Rt hip    [facility] Adult PT Treatment:                                                DATE: 12/10/23 Therapeutic Exercise: Recumbent bike L3, 6 min  Therapeutic Activity: Seated hip flexion with abduction (simulate driving motion of foot pedal)  Sidelying hip CARS Hip abduction Clam no band  Ball squeeze x 10 to bridge with ball x 10  Single leg bridge x 10 R/L  Quadruped hip hinge Quadruped hip extension  Glute med drops off yoga block  Hip abduction x 10  Hip hinge x 15 single leg, no hands for the last 5 reps  Manual :   PROM Rt hip with compression to glutes   Trigger point work to [employer] glute/piriformis    [facility] Adult PT Treatment:  DATE:  12/05/23 Therapeutic Exercise: Sit to stand no UE STS x 10 lbs  Sit to stand 10 lbs suitcase  Manual Therapy: STM to Rt ant lateral hip, thigh Scar tissue massage Rt hip   Therapeutic Activity: Gait no device, arm swing Added Rt UE holding 10 lbs  Hip abduction 3 lbs Rt LE 2 x 15 , each leg for WB and activity tolerance Step up x 15 each with opp hip flex   Modalities:  10 min Cold pack      PATIENT EDUCATION:  Education details: hip precautions, POC, HEP  Person educated: Patient Education method: Programmer, multimedia, Demonstration, Verbal cues, and Handouts Education comprehension: verbalized understanding, returned demonstration, and needs further education  HOME EXERCISE PROGRAM: Access Code: [email or web address] URL: [email or web address] Date: 11/20/2023 Prepared by: Delon Norma  Exercises - Supine Quad Set  - 1 x daily - 7 x weekly - 2 sets - 10 reps - 30 hold - Supine Bridge  - 1 x daily - 7 x weekly - 2 sets - 10 reps - 30 hold (SMALL RANGE OF MOTION)  - Sit to Stand  - 1 x daily - 7 x weekly - 2 sets - 10 reps - 5 hold - Standing Hip Abduction with Counter Support  - 1 x daily - 7 x weekly - 2 sets - 10 reps - 5 hold (TOE FORWARD)  - Standing Marching  - 1 x daily - 7 x weekly - 2 sets - 10 reps - 5 hold  ASSESSMENT:  CLINICAL IMPRESSION:  Mrs Aybar reports continued pain located along the iliposas muscle on the R. She continues to have pain and limited AROM with hip flexion but reports no pain with PROM. Todays session worked modulating pain and relieving tension along the hip flexors. Worked on gentle activation of the hip flexors and stretching which she was able to complete with the report of soreness with activation. Worked on addressing potential driving positioning to help relieve tension on the hip flexors.   OBJECTIVE IMPAIRMENTS: decreased activity tolerance, decreased endurance, decreased knowledge of use of DME, decreased mobility, difficulty  walking, decreased ROM, decreased strength, increased edema, increased fascial restrictions, increased muscle spasms, impaired sensation, and pain.   ACTIVITY LIMITATIONS: carrying, lifting, bending, sitting, standing, squatting, sleeping, transfers, bed mobility, dressing, and locomotion level  PARTICIPATION LIMITATIONS: cleaning, driving, shopping, community activity, and occupation  PERSONAL FACTORS: Past/current experiences, Transportation, and 1-2 comorbidities: EDS< previous hip surgeries are also affecting patient's functional outcome.   REHAB POTENTIAL: Excellent  CLINICAL DECISION MAKING: Evolving/moderate complexity  EVALUATION COMPLEXITY: Moderate   GOALS: Goals reviewed with patient? Yes  SHORT TERM GOALS: Target date: 12/18/2023   Patient will be able to show independence for initial HEP to include posture, core and hip strength and stability.   Baseline: Goal status: Met  2.  Patient will be I with concepts of joint protection, hip precautions and stability as it pertains to joint hypermobility. Baseline:  Goal status: Met  3.  Patient will be able to ambulate in the clinic without the use of crutch and no discernible limp 300 feet  Baseline:  Goal status: MET   LONG TERM GOALS: Target date: 01/15/2024    LEFS score will increase by 16 points indicating improved functional mobility Baseline: 19/80 Goal status: INITIAL  2.  Patient will be independent with home exercise program upon discharge from physical therapy Baseline:  Goal status: INITIAL  3.  Patient will be able to walk without assistive device and  pain no more than 2/10 In the right hip community distances > 1000 feet  Baseline:  Goal status: INITIAL  4.  Patient will demonstrate a pain free squat in order to complete transfers repetitively and obtain items below the waist for work and home tasks Baseline:  Goal status: INITIAL  5.  Patient will be able to demonstrate hip/knee/ankle strength to  5/5 in order to maximize functional mobility, ambulation and lifting.   Baseline:  Goal status: INITIAL  PLAN:  PT FREQUENCY: 2x/week  PT DURATION: 8 weeks  PLANNED INTERVENTIONS: 97164- PT Re-evaluation, 97750- Physical Performance Testing, 97110-Therapeutic exercises, 97530- Therapeutic activity, [id number]- Neuromuscular re-education, 97535- Self Care, [id number]- Manual therapy, [phone]- Gait training, [phone]- Aquatic Therapy, [phone]- Electrical stimulation (unattended), Patient/Family education, Balance training, Stair training, Taping, DME instructions, Cryotherapy, and Moist heat  PLAN FOR NEXT SESSION: standing step ups, lateral and forward, hip abd . review home exercise program, should follow hip precautions until she sees MD 8/18.  manual therapy. May benefit from a hip spica wrap to help relieve tension on hip flexor.    Omar Orrego PT, DPT, LAT, ATC  12/24/23  3:09 PM

## 2023-12-25 DIAGNOSIS — F4312 Post-traumatic stress disorder, chronic: Secondary | ICD-10-CM | POA: Diagnosis not present

## 2023-12-25 DIAGNOSIS — F411 Generalized anxiety disorder: Secondary | ICD-10-CM | POA: Diagnosis not present

## 2023-12-25 DIAGNOSIS — F332 Major depressive disorder, recurrent severe without psychotic features: Secondary | ICD-10-CM | POA: Diagnosis not present

## 2023-12-25 NOTE — Therapy (Unsigned)
 OUTPATIENT PHYSICAL THERAPY LOWER EXTREMITY NOTE    Patient Name: Brianna [name] MRN: [id number] DOB:[birth year], 28 y.o., female Today's Date: 12/26/2023  END OF SESSION:  PT End of Session - 12/26/23 1505     Visit Number 11    Number of Visits 16    Date for PT Re-Evaluation 01/15/24    Authorization Type [employer]    PT Start Time 1503    PT Stop Time 1545    PT Time Calculation (min) 42 min    Activity Tolerance Patient tolerated treatment well    Behavior During Therapy [facility] for tasks assessed/performed             Past Medical History:  Diagnosis Date   Acute recurrent pansinusitis 10/21/2019   ADHD (attention deficit hyperactivity disorder)    inattentive   Allergy    Anemia    had iron infusions in Fall of 2023   Anxiety    Arthritis    Joints, hips   Asthma    EIA   Bronchitis    Cat allergy due to both airborne and skin contact 05/01/2016   Chronic sore throat 05/01/2016   COVID 2020   Depression    Ehlers-Danlos syndrome    Fibromyalgia    pt denies   Gastroparesis    GERD (gastroesophageal reflux disease)    History of hypotension    taken midodrine  in the past- Spring of 2022   Hypermobile Ehlers-Danlos syndrome    Irregular heart rate    Keratosis pilaris    Migraine    w and w/o auras   PCOS (polycystic ovarian syndrome) 10/26/2022   PONV (postoperative nausea and vomiting)    I can get really cold   PTSD (post-traumatic stress disorder)    Seasonal allergic rhinitis 03/11/2014   Past Surgical History:  Procedure Laterality Date   CHOLECYSTECTOMY  05/09/2021   ESOPHAGOGASTRODUODENOSCOPY ENDOSCOPY     03/2020   HIP ARTHROPLASTY Right    HIP ARTHROSCOPY Right 04/25/2022   Procedure: RIGHT HIP ARTHROSCOPY WITH LABRAL RECONSTRUCTION, ILIOTIBIAL BAND ALLOGRAFT, PSOAS RELEASE;  Surgeon: Genelle Standing, MD;  Location: [facility] OR;  Service: Orthopedics;  Laterality: Right;   HIP SURGERY Right    per pt   JOINT REPLACEMENT     Upcoming THR  right hip 09/03/23   LABRAL REPAIR Right 03/02/2021   Procedure: RIGHT HIP ARTHROSCOPY WITH LABRAL REPAIR AND PINCE DEBRIDEMENT;  Surgeon: Genelle Standing, MD;  Location: [facility] OR;  Service: Orthopedics;  Laterality: Right;   SMART PILL PROCEDURE     12/2020   TYMPANOSTOMY TUBE PLACEMENT Bilateral    WISDOM TOOTH EXTRACTION     Patient Active Problem List   Diagnosis Date Noted   Chronic night sweats 08/21/2023   PCOS (polycystic ovarian syndrome) 10/26/2022   Cervical adenopathy 06/10/2021   Chronic pruritus 06/10/2021   Exercise-induced asthma 06/09/2021   Chronic venous insufficiency 06/03/2021   Orthostatic lightheadedness 06/03/2021   Purple toe syndrome of both feet (HCC) 06/03/2021   Autonomic dysfunction 06/03/2021   Tear of right acetabular labrum 04/13/2021   Migraine headache 03/01/2021   Headache 11/30/2020   Positive ANA (antinuclear antibody) 11/15/2020   Fibromyalgia 11/02/2020   Hair loss 11/02/2020   Muscle spasticity 10/22/2020   Hypermobile Ehlers-Danlos syndrome 10/22/2020   Paresthesia of bilateral legs 10/06/2020   Myalgia 10/06/2020   Chronic pelvic pain in female 10/06/2020   Chronic abdominal pain 10/06/2020   Irritable colon 08/02/2020   Pelvic floor dysfunction 08/02/2020   Irritant  dermatitis 08/02/2020   Gastroesophageal reflux disease 06/28/2020   Severe recurrent major depression without psychotic features (HCC) 06/28/2020   Polyarthralgia 06/28/2020   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 06/28/2020   Tachycardia 06/28/2020   History of COVID-19 06/28/2020   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 deficit 06/28/2020   Recurrent major depressive disorder, in full remission (HCC) 06/28/2020   Atypical chest pain 06/22/2020   Dizziness 06/22/2020   Palpitations 06/14/2020   Anxiety 05/04/2020   Internal and external hemorrhoids without complication 04/29/2020   Nondiabetic gastroparesis 03/03/2020   ETD (Eustachian  tube dysfunction), bilateral 10/21/2019   Acne vulgaris 03/11/2014   Mild intermittent asthma without complication 03/11/2014   Seasonal allergies 03/11/2014    PCP: Wendolyn Jenkins Jansky MD  REFERRING PROVIDER: Ezzard Redell Ezzard MD  REFERRING DIAG: [phone] (ICD-10-CM) - 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  She reports some relief after her next visit but didn't last.  Deep ache, groin and also proximal thigh. The divet is bigger in my great trochanter. The surgeon says I don't need a XR.       Patient presents physical therapy evaluation status post right anterior hip replacement.  She has a history of 2 labral repairs in the past but after multiple surgical consultations it was found that her hip dysplasia was causing osteoarthritis and significant joint degeneration.  This is a sensitive topic for her because she has been dealing with this problem for several years and wonders how no one found hip dysplasia on her x-ray.  She opted for total hip replacement and overall is pleased with her progress.  She had home health physical therapy for 2 weeks following discharge from hospital.  Overall she has been keeping up with her exercises.  She walks with 1 crutch without incident but cannot limp the only thing I have difficulty with is putting on my socks.  She does have some issues with pain in her proximal anterior thigh and hip.  She has some numbness and tingling around the site.  She is planning to move back  into her home this week which is on the first level.  She has not seen the surgeon yet but has an appointment in a few weeks.  She was unclear about any precautions other than what her home health therapist gave her.   PERTINENT HISTORY: Ehlers-Danlos syndrome, symptoms of dysautonomia including palpitations dizziness and heat intolerance  chronic joint and muscle pain PAIN:  Are you having pain? Yes: NPRS scale: 6 -7/10  Pain location: Rt ant hip  Pain description: throbbing, achy  Aggravating factors: standing, walking  Relieving factors: Changing positions but something that helps the most is an ice pack which she uses frequently  PRECAUTIONS: Anterior hip patient would be wise to follow due to collagen defect due to hypermobile Ehlers-Danlos syndrome  RED FLAGS: None   WEIGHT BEARING RESTRICTIONS: No  FALLS:  Has patient fallen in last 6 months? No  LIVING ENVIRONMENT: Lives with: lives alone Lives in: House/apartment Stairs: No Has following equipment at home: Crutches shower chair  OCCUPATION: [employer] out until 8/11 .   PLOF: Independent  PATIENT GOALS: I want to be walk pain free   NEXT MD VISIT: 7/18 [facility]   OBJECTIVE:  Note: Objective measures were completed at Evaluation unless otherwise noted.  DIAGNOSTIC FINDINGS: see chart ([facility])   PATIENT SURVEYS:   COGNITION: Overall cognitive status: Within functional limits for tasks assessed     SENSATION: Patient reports numbness and tingling near incision site and along the lateral thigh  EDEMA:  Minimal surrounding incision Patient points out a apparent deformity on the right lateral hip just distal to the greater trochanter.  She reports that was present before her total hip replacement but is questioning what it is it did feel a bit puffy to me but it could also be gluteus atrophy which makes it look a little bit more pronounced  POSTURE: No Significant postural limitations  PALPATION: Tender to  palpation along  well-healed incision  LOWER EXTREMITY MNF:[facility] hip ER/IR   Active ROM Right eval Left eval  Hip flexion P90 P 120  Hip extension    Hip abduction    Hip adduction    Hip internal rotation A24,P25 A35, P30  Hip external rotation A3, P10 A30, P50   Knee flexion    Knee extension    Ankle dorsiflexion    Ankle plantarflexion    Ankle inversion    Ankle eversion     (Blank rows = not tested)  LOWER EXTREMITY MMT:  MMT Right eval Left eval  Hip flexion 4 5  Hip extension    Hip abduction    Hip adduction    Hip internal rotation 4 4  Hip external rotation 3+ 4  Knee flexion 4+ 5  Knee extension 5 5  Ankle dorsiflexion    Ankle plantarflexion    Ankle inversion    Ankle eversion     (Blank rows = not tested)    FUNCTIONAL TESTS:  5 times sit to stand: 16 seconds 30 seconds chair stand test, 9 repetitions  GAIT: Distance walked: 100 feet Assistive device utilized: Crutches x1 Level of assistance: Complete Independence Comments: Patient without appreciable limp for the first 75 feet, slower more cautious pace                                                                                                                                TREATMENT DATE:   [facility] Adult PT Treatment:                                                DATE: 12/26/23 Therapeutic Exercise: Supine quad, glute set SAQ 4 lbs x 15  Used towel for assisted hip flexion with small circles Rt LE bent knee fall out x 10 controlled  Hip rotations knees together and knees  apart Bridging x 10  Manual Therapy: Supine hip flexor stretch 2 x 30 sec LAD grade I-II with oscillations IASTM thigh in progressively stretched position Prone quad stretch, PROM ER and IR  Self Care: Try TENS, add cold pack  Avoid over aggravating hip flexion for now.     [facility] Adult PT Treatment:                                                DATE: 12/24/23 MTPR along distal iliopsoas, TFL  STW along glute  med Supine hip flexor stretch 2 x 30 sec LAD grade I-II with oscillations Heel slides 1 x 8 Supine marching 1 x 10 marching with heel raise progressed to 1 x 8 light hip flexor Bridge bil LE 2 x 10  Adjusted seating in vehicle to maximize function/ reduce stress.   [facility] Adult PT Treatment:                                                DATE: 12/20/23 MTPR along the distal iliopsoas and proximal to mid rectus femoris Grade I LAD with oscillations  Hip flexion with grade I-II hip distraction Standing r hip flexor stretch with glute activation cues to avoid excessive/ over stretching MHP x 13 min in supine with bolster under knees.  [facility] Adult PT Treatment/ NO CHARGE                                      DATE: 12/17/23 Pt arrives reporting that she was running late and caught her right foot getting into her car. She heard 2 loud audible pops and is now having significant pain anterior hip/ groin pain with hip flexion. Cold pack applied, and pt encouraged to monitor symptoms and go to walk in ortho clinic if not improved in the next couple of days.   [facility] Adult PT Treatment:                                                DATE: 12/12/23 Therapeutic Exercise: Supine quad activation L LE with thigh supported Hamstring stretch  Leg lengthener Isometric extension of Rt hip x 10, 5 sec  Ball squeeze to bridge x 10  Quad stretch x 5, 30 sec Rt LE in prone with pillow under knees   Therapeutic Activity: Pilates Reformer used for LE/core strength, postural strength, lumbopelvic disassociation and core control.  Exercises included: Footwork 2 red 1 blue parallel and ER narrow and wide Calf raises  Single leg 2 red 1 blue x 15 added alt knee/hip over/under bar  Cold pack 10 min Rt hip    [facility] Adult PT Treatment:                                                DATE: 12/10/23 Therapeutic Exercise: Recumbent bike L3, 6 min  Therapeutic Activity: Seated  hip flexion with abduction (simulate driving  motion of foot pedal)  Sidelying hip CARS Hip abduction Clam no band  Ball squeeze x 10 to bridge with ball x 10  Single leg bridge x 10 R/L  Quadruped hip hinge Quadruped hip extension  Glute med drops off yoga block  Hip abduction x 10  Hip hinge x 15 single leg, no hands for the last 5 reps  Manual :   PROM Rt hip with compression to glutes   Trigger point work to [employer] glute/piriformis    [facility] Adult PT Treatment:                                                DATE: 12/05/23 Therapeutic Exercise: Sit to stand no UE STS x 10 lbs  Sit to stand 10 lbs suitcase  Manual Therapy: STM to Rt ant lateral hip, thigh Scar tissue massage Rt hip   Therapeutic Activity: Gait no device, arm swing Added Rt UE holding 10 lbs  Hip abduction 3 lbs Rt LE 2 x 15 , each leg for WB and activity tolerance Step up x 15 each with opp hip flex   Modalities:  10 min Cold pack      PATIENT EDUCATION:  Education details: hip precautions, POC, HEP  Person educated: Patient Education method: Programmer, multimedia, Demonstration, Verbal cues, and Handouts Education comprehension: verbalized understanding, returned demonstration, and needs further education  HOME EXERCISE PROGRAM: Access Code: [email or web address] URL: [email or web address] Date: 11/20/2023 Prepared by: Delon Norma  Exercises - Supine Quad Set  - 1 x daily - 7 x weekly - 2 sets - 10 reps - 30 hold - Supine Bridge  - 1 x daily - 7 x weekly - 2 sets - 10 reps - 30 hold (SMALL RANGE OF MOTION)  - Sit to Stand  - 1 x daily - 7 x weekly - 2 sets - 10 reps - 5 hold - Standing Hip Abduction with Counter Support  - 1 x daily - 7 x weekly - 2 sets - 10 reps - 5 hold (TOE FORWARD)  - Standing Marching  - 1 x daily - 7 x weekly - 2 sets - 10 reps - 5 hold  ASSESSMENT:  CLINICAL IMPRESSION:  Patient continue to have mod to severe pain in Rt hip, irritated with active hip flexion.  Provided gentle Rt hip mobility and quad, glute strengthening  (light). Encouraged her to continue with HEP as tolerated but avoid excessive hip flexion unless for functional activities . She is able to weight bear on the Rt LE without increased pain. Will cont to  progress as tolerated next week.   OBJECTIVE IMPAIRMENTS: decreased activity tolerance, decreased endurance, decreased knowledge of use of DME, decreased mobility, difficulty walking, decreased ROM, decreased strength, increased edema, increased fascial restrictions, increased muscle spasms, impaired sensation, and pain.   ACTIVITY LIMITATIONS: carrying, lifting, bending, sitting, standing, squatting, sleeping, transfers, bed mobility, dressing, and locomotion level  PARTICIPATION LIMITATIONS: cleaning, driving, shopping, community activity, and occupation  PERSONAL FACTORS: Past/current experiences, Transportation, and 1-2 comorbidities: EDS< previous hip surgeries are also affecting patient's functional outcome.   REHAB POTENTIAL: Excellent  CLINICAL DECISION MAKING: Evolving/moderate complexity  EVALUATION COMPLEXITY: Moderate   GOALS: Goals reviewed with patient? Yes  SHORT TERM GOALS: Target date: 12/18/2023   Patient will be able to show independence for initial HEP  to include posture, core and hip strength and stability.   Baseline: Goal status: Met  2.  Patient will be I with concepts of joint protection, hip precautions and stability as it pertains to joint hypermobility. Baseline:  Goal status: Met  3.  Patient will be able to ambulate in the clinic without the use of crutch and no discernible limp 300 feet  Baseline:  Goal status: MET   LONG TERM GOALS: Target date: 01/15/2024    LEFS score will increase by 16 points indicating improved functional mobility Baseline: 19/80 Goal status: INITIAL  2.  Patient will be independent with home exercise program upon discharge from physical therapy Baseline:  Goal status: INITIAL  3.  Patient will be able to walk without  assistive device and pain no more than 2/10 In the right hip community distances > 1000 feet  Baseline:  Goal status: INITIAL  4.  Patient will demonstrate a pain free squat in order to complete transfers repetitively and obtain items below the waist for work and home tasks Baseline:  Goal status: INITIAL  5.  Patient will be able to demonstrate hip/knee/ankle strength to 5/5 in order to maximize functional mobility, ambulation and lifting.   Baseline:  Goal status: INITIAL  PLAN:  PT FREQUENCY: 2x/week  PT DURATION: 8 weeks  PLANNED INTERVENTIONS: [id number]- PT Re-evaluation, 97750- Physical Performance Testing, 97110-Therapeutic exercises, 97530- Therapeutic activity, [id number]- Neuromuscular re-education, 97535- Self Care, [id number]- Manual therapy, [phone]- Gait training, [phone]- Aquatic Therapy, [phone]- Electrical stimulation (unattended), Patient/Family education, Balance training, Stair training, Taping, DME instructions, Cryotherapy, and Moist heat  PLAN FOR NEXT SESSION: standing step ups, lateral and forward, hip abd . review home exercise program, should follow hip precautions until she sees MD 8/18.  manual therapy. May benefit from a hip spica wrap to help relieve tension on hip flexor.  Delon Norma, PT 12/26/23 4:45 PM Phone: [phone] Fax: [phone]

## 2023-12-26 ENCOUNTER — Ambulatory Visit: Payer: Self-pay | Admitting: Physical Therapy

## 2023-12-26 ENCOUNTER — Encounter: Payer: Self-pay | Admitting: Physical Therapy

## 2023-12-26 DIAGNOSIS — Z96641 Presence of right artificial hip joint: Secondary | ICD-10-CM | POA: Diagnosis not present

## 2023-12-26 DIAGNOSIS — M25551 Pain in right hip: Secondary | ICD-10-CM

## 2023-12-26 DIAGNOSIS — M357 Hypermobility syndrome: Secondary | ICD-10-CM

## 2023-12-26 DIAGNOSIS — M6281 Muscle weakness (generalized): Secondary | ICD-10-CM | POA: Diagnosis not present

## 2023-12-28 DIAGNOSIS — I75023 Atheroembolism of bilateral lower extremities: Secondary | ICD-10-CM | POA: Diagnosis not present

## 2023-12-28 DIAGNOSIS — K219 Gastro-esophageal reflux disease without esophagitis: Secondary | ICD-10-CM | POA: Diagnosis not present

## 2023-12-28 DIAGNOSIS — Z87891 Personal history of nicotine dependence: Secondary | ICD-10-CM | POA: Diagnosis not present

## 2023-12-28 DIAGNOSIS — I872 Venous insufficiency (chronic) (peripheral): Secondary | ICD-10-CM | POA: Diagnosis not present

## 2023-12-28 DIAGNOSIS — K582 Mixed irritable bowel syndrome: Secondary | ICD-10-CM | POA: Diagnosis not present

## 2023-12-28 DIAGNOSIS — G43909 Migraine, unspecified, not intractable, without status migrainosus: Secondary | ICD-10-CM | POA: Diagnosis not present

## 2023-12-28 DIAGNOSIS — J4520 Mild intermittent asthma, uncomplicated: Secondary | ICD-10-CM | POA: Diagnosis not present

## 2023-12-28 DIAGNOSIS — K3184 Gastroparesis: Secondary | ICD-10-CM | POA: Diagnosis not present

## 2023-12-28 DIAGNOSIS — Z79899 Other long term (current) drug therapy: Secondary | ICD-10-CM | POA: Diagnosis not present

## 2023-12-28 DIAGNOSIS — R195 Other fecal abnormalities: Secondary | ICD-10-CM | POA: Diagnosis not present

## 2023-12-29 DIAGNOSIS — F332 Major depressive disorder, recurrent severe without psychotic features: Secondary | ICD-10-CM | POA: Diagnosis not present

## 2023-12-31 ENCOUNTER — Encounter: Payer: Self-pay | Admitting: Physical Therapy

## 2023-12-31 ENCOUNTER — Ambulatory Visit: Admitting: Physical Therapy

## 2023-12-31 DIAGNOSIS — M357 Hypermobility syndrome: Secondary | ICD-10-CM | POA: Diagnosis not present

## 2023-12-31 DIAGNOSIS — Z96641 Presence of right artificial hip joint: Secondary | ICD-10-CM

## 2023-12-31 DIAGNOSIS — M6281 Muscle weakness (generalized): Secondary | ICD-10-CM

## 2023-12-31 DIAGNOSIS — M25551 Pain in right hip: Secondary | ICD-10-CM

## 2023-12-31 DIAGNOSIS — F332 Major depressive disorder, recurrent severe without psychotic features: Secondary | ICD-10-CM | POA: Diagnosis not present

## 2023-12-31 NOTE — Therapy (Signed)
 OUTPATIENT PHYSICAL THERAPY LOWER EXTREMITY NOTE    Patient Name: Brianna [name] MRN: [id number] DOB:February 22, 1996, 28 y.o., female Today's Date: 12/31/2023  END OF SESSION:  PT End of Session - 12/31/23 1423     Visit Number 12    Number of Visits 16    Date for PT Re-Evaluation 01/15/24    Authorization Type [employer]    PT Start Time 1423    PT Stop Time 1500    PT Time Calculation (min) 37 min    Activity Tolerance Patient tolerated treatment well    Behavior During Therapy [facility] for tasks assessed/performed              Past Medical History:  Diagnosis Date   Acute recurrent pansinusitis 10/21/2019   ADHD (attention deficit hyperactivity disorder)    inattentive   Allergy    Anemia    had iron infusions in Fall of 2023   Anxiety    Arthritis    Joints, hips   Asthma    EIA   Bronchitis    Cat allergy due to both airborne and skin contact 05/01/2016   Chronic sore throat 05/01/2016   COVID 2020   Depression    Ehlers-Danlos syndrome    Fibromyalgia    pt denies   Gastroparesis    GERD (gastroesophageal reflux disease)    History of hypotension    taken midodrine  in the past- Spring of 2022   Hypermobile Ehlers-Danlos syndrome    Irregular heart rate    Keratosis pilaris    Migraine    w and w/o auras   PCOS (polycystic ovarian syndrome) 10/26/2022   PONV (postoperative nausea and vomiting)    I can get really cold   PTSD (post-traumatic stress disorder)    Seasonal allergic rhinitis 03/11/2014   Past Surgical History:  Procedure Laterality Date   CHOLECYSTECTOMY  05/09/2021   ESOPHAGOGASTRODUODENOSCOPY ENDOSCOPY     03/2020   HIP ARTHROPLASTY Right    HIP ARTHROSCOPY Right 04/25/2022   Procedure: RIGHT HIP ARTHROSCOPY WITH LABRAL RECONSTRUCTION, ILIOTIBIAL BAND ALLOGRAFT, PSOAS RELEASE;  Surgeon: Genelle Standing, MD;  Location: [facility] OR;  Service: Orthopedics;  Laterality: Right;   HIP SURGERY Right    per pt   JOINT REPLACEMENT     Upcoming  THR right hip 09/03/23   LABRAL REPAIR Right 03/02/2021   Procedure: RIGHT HIP ARTHROSCOPY WITH LABRAL REPAIR AND PINCE DEBRIDEMENT;  Surgeon: Genelle Standing, MD;  Location: [facility] OR;  Service: Orthopedics;  Laterality: Right;   SMART PILL PROCEDURE     12/2020   TYMPANOSTOMY TUBE PLACEMENT Bilateral    WISDOM TOOTH EXTRACTION     Patient Active Problem List   Diagnosis Date Noted   Chronic night sweats 08/21/2023   PCOS (polycystic ovarian syndrome) 10/26/2022   Cervical adenopathy 06/10/2021   Chronic pruritus 06/10/2021   Exercise-induced asthma 06/09/2021   Chronic venous insufficiency 06/03/2021   Orthostatic lightheadedness 06/03/2021   Purple toe syndrome of both feet (HCC) 06/03/2021   Autonomic dysfunction 06/03/2021   Tear of right acetabular labrum 04/13/2021   Migraine headache 03/01/2021   Headache 11/30/2020   Positive ANA (antinuclear antibody) 11/15/2020   Fibromyalgia 11/02/2020   Hair loss 11/02/2020   Muscle spasticity 10/22/2020   Hypermobile Ehlers-Danlos syndrome 10/22/2020   Paresthesia of bilateral legs 10/06/2020   Myalgia 10/06/2020   Chronic pelvic pain in female 10/06/2020   Chronic abdominal pain 10/06/2020   Irritable colon 08/02/2020   Pelvic floor dysfunction 08/02/2020  Irritant dermatitis 08/02/2020   Gastroesophageal reflux disease 06/28/2020   Severe recurrent major depression without psychotic features (HCC) 06/28/2020   Polyarthralgia 06/28/2020   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 06/28/2020   Tachycardia 06/28/2020   History of COVID-19 06/28/2020   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 deficit 06/28/2020   Recurrent major depressive disorder, in full remission (HCC) 06/28/2020   Atypical chest pain 06/22/2020   Dizziness 06/22/2020   Palpitations 06/14/2020   Anxiety 05/04/2020   Internal and external hemorrhoids without complication 04/29/2020   Nondiabetic gastroparesis 03/03/2020   ETD  (Eustachian tube dysfunction), bilateral 10/21/2019   Acne vulgaris 03/11/2014   Mild intermittent asthma without complication 03/11/2014   Seasonal allergies 03/11/2014    PCP: Wendolyn Jenkins Jansky MD  REFERRING PROVIDER: Ezzard Redell Ezzard MD  REFERRING DIAG: [phone] (ICD-10-CM) - 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      Patient presents physical therapy evaluation status post right anterior hip replacement.  She has a history of 2 labral repairs in the past but after multiple surgical consultations it was found that her hip dysplasia was causing osteoarthritis and significant joint degeneration.  This is a sensitive topic for her because she has been dealing with this problem for several years and wonders how no one found hip dysplasia on her x-ray.  She opted for total hip replacement and overall is pleased with her progress.  She had home health physical therapy for 2 weeks following discharge from hospital.  Overall she has been keeping up with her exercises.  She walks with 1 crutch without incident but cannot limp the only thing I have difficulty with is putting on my socks.  She does have some issues with pain in her proximal anterior thigh and hip.  She has some numbness and tingling around the site.  She is planning to move back into her home this week which is on the first level.  She has not seen the surgeon yet but  has an appointment in a few weeks.  She was unclear about any precautions other than what her home health therapist gave her.   PERTINENT HISTORY: Ehlers-Danlos syndrome, symptoms of dysautonomia including palpitations dizziness and heat intolerance  chronic joint and muscle pain PAIN:  Are you having pain? Yes: NPRS scale: 6 -7/10  Pain location: Rt ant hip  Pain description: throbbing, achy  Aggravating factors: standing, walking  Relieving factors: Changing positions but something that helps the most is an ice pack which she uses frequently  PRECAUTIONS: Anterior hip patient would be wise to follow due to collagen defect due to hypermobile Ehlers-Danlos syndrome  RED FLAGS: None   WEIGHT BEARING RESTRICTIONS: No  FALLS:  Has patient fallen in last 6 months? No  LIVING ENVIRONMENT: Lives with: lives alone Lives in: House/apartment Stairs: No Has following equipment at home: Crutches shower chair   OCCUPATION: [employer] out until 8/11 .   PLOF: Independent  PATIENT GOALS: I want to be walk pain  free   NEXT MD VISIT: 7/18 [facility]   OBJECTIVE:  Note: Objective measures were completed at Evaluation unless otherwise noted.  DIAGNOSTIC FINDINGS: see chart ([facility])   PATIENT SURVEYS:   COGNITION: Overall cognitive status: Within functional limits for tasks assessed     SENSATION: Patient reports numbness and tingling near incision site and along the lateral thigh  EDEMA:  Minimal surrounding incision Patient points out a apparent deformity on the right lateral hip just distal to the greater trochanter.  She reports that was present before her total hip replacement but is questioning what it is it did feel a bit puffy to me but it could also be gluteus atrophy which makes it look a little bit more pronounced  POSTURE: No Significant postural limitations  PALPATION: Tender to palpation along  well-healed incision  LOWER EXTREMITY MNF:[facility] hip ER/IR   Active ROM  Right eval Left eval  Hip flexion P90 P 120  Hip extension    Hip abduction    Hip adduction    Hip internal rotation A24,P25 A35, P30  Hip external rotation A3, P10 A30, P50   Knee flexion    Knee extension    Ankle dorsiflexion    Ankle plantarflexion    Ankle inversion    Ankle eversion     (Blank rows = not tested)  LOWER EXTREMITY MMT:  MMT Right eval Left eval  Hip flexion 4 5  Hip extension    Hip abduction    Hip adduction    Hip internal rotation 4 4  Hip external rotation 3+ 4  Knee flexion 4+ 5  Knee extension 5 5  Ankle dorsiflexion    Ankle plantarflexion    Ankle inversion    Ankle eversion     (Blank rows = not tested)    FUNCTIONAL TESTS:  5 times sit to stand: 16 seconds 30 seconds chair stand test, 9 repetitions  GAIT: Distance walked: 100 feet Assistive device utilized: Crutches x1 Level of assistance: Complete Independence Comments: Patient without appreciable limp for the first 75 feet, slower more cautious pace                                                                                                                                TREATMENT DATE:   [facility] Adult PT Treatment:                                                DATE: 12/31/23 Therapeutic Exercise: Recumbent bike L2 for 6 min  Wall sit 30 sec x 3 toes, then heels holding ball  Supported squat x 10 then squat x 10 no UEs Ant hip stretch gentle Rt LE back  Step up to high knee 4 inch  Reverse step up for glute med Each side x 10  Glute med drops x 15 off 4 inch step  Supine hip flexion AAROM  Rt knee ext/flex with blue band added straight leg arc with blue band  Butterfly stretch gentle  LTR knees wide  Manual Therapy: LAD, Rt hip, thigh soft tissue work , IASTM along scar tissue, gentle pressure applied   [facility] Adult PT Treatment:                                                DATE: 12/26/23 Therapeutic Exercise: Supine quad, glute set SAQ 4 lbs x 15  Used towel for  assisted hip flexion with small circles Rt LE bent knee fall out x 10 controlled  Hip rotations knees together and knees apart Bridging x 10  Manual Therapy: Supine hip flexor stretch 2 x 30 sec LAD grade I-II with oscillations IASTM thigh in progressively stretched position Prone quad stretch, PROM ER and IR  Self Care: Try TENS, add cold pack  Avoid over aggravating hip flexion for now.     [facility] Adult PT Treatment:                                                DATE: 12/24/23 MTPR along distal iliopsoas, TFL  STW along glute med Supine hip flexor stretch 2 x 30 sec LAD grade I-II with oscillations Heel slides 1 x 8 Supine marching 1 x 10 marching with heel raise progressed to 1 x 8 light hip flexor Bridge bil LE 2 x 10  Adjusted seating in vehicle to maximize function/ reduce stress.    PATIENT EDUCATION:  Education details: hip precautions, POC, HEP  Person educated: Patient Education method: Explanation, Demonstration, Verbal cues, and Handouts Education comprehension: verbalized understanding, returned demonstration, and needs further education  HOME EXERCISE PROGRAM: Access Code: [email or web address] URL: [email or web address] Date: 11/20/2023 Prepared by: Delon Norma  Exercises - Supine Quad Set  - 1 x daily - 7 x weekly - 2 sets - 10 reps - 30 hold - Supine Bridge  - 1 x daily - 7 x weekly - 2 sets - 10 reps - 30 hold (SMALL RANGE OF MOTION)  - Sit to Stand  - 1 x daily - 7 x weekly - 2 sets - 10 reps - 5 hold - Standing Hip Abduction with Counter Support  - 1 x daily - 7 x weekly - 2 sets - 10 reps - 5 hold (TOE FORWARD)  - Standing Marching  - 1 x daily - 7 x weekly - 2 sets - 10 reps - 5 hold  ASSESSMENT:  CLINICAL IMPRESSION:  Patient did receive/purchase a hip spica to provide more stability and comfort in standing and walking.  She was able to tolerate some more standing today and showed increased active assisted and active range of motion in her right  hip flexion.  Pain with right AROM in ER and IR as well.  Therapy is not pushing toward the end range of any of these motions just trying mobility.  She may benefit from an upgrade of her home exercise program.  OBJECTIVE IMPAIRMENTS: decreased activity tolerance, decreased endurance, decreased knowledge of use of DME, decreased mobility, difficulty walking, decreased ROM, decreased strength, increased edema, increased fascial restrictions, increased  muscle spasms, impaired sensation, and pain.   ACTIVITY LIMITATIONS: carrying, lifting, bending, sitting, standing, squatting, sleeping, transfers, bed mobility, dressing, and locomotion level  PARTICIPATION LIMITATIONS: cleaning, driving, shopping, community activity, and occupation  PERSONAL FACTORS: Past/current experiences, Transportation, and 1-2 comorbidities: EDS< previous hip surgeries are also affecting patient's functional outcome.   REHAB POTENTIAL: Excellent  CLINICAL DECISION MAKING: Evolving/moderate complexity  EVALUATION COMPLEXITY: Moderate   GOALS: Goals reviewed with patient? Yes  SHORT TERM GOALS: Target date: 12/18/2023   Patient will be able to show independence for initial HEP to include posture, core and hip strength and stability.   Baseline: Goal status: Met  2.  Patient will be I with concepts of joint protection, hip precautions and stability as it pertains to joint hypermobility. Baseline:  Goal status: Met  3.  Patient will be able to ambulate in the clinic without the use of crutch and no discernible limp 300 feet  Baseline:  Goal status: MET   LONG TERM GOALS: Target date: 01/15/2024    LEFS score will increase by 16 points indicating improved functional mobility Baseline: 19/80 Goal status: INITIAL  2.  Patient will be independent with home exercise program upon discharge from physical therapy Baseline:  Goal status: INITIAL  3.  Patient will be able to walk without assistive device and pain no  more than 2/10 In the right hip community distances > 1000 feet  Baseline:  Goal status: INITIAL  4.  Patient will demonstrate a pain free squat in order to complete transfers repetitively and obtain items below the waist for work and home tasks Baseline:  Goal status: INITIAL  5.  Patient will be able to demonstrate hip/knee/ankle strength to 5/5 in order to maximize functional mobility, ambulation and lifting.   Baseline:  Goal status: INITIAL  PLAN:  PT FREQUENCY: 2x/week  PT DURATION: 8 weeks  PLANNED INTERVENTIONS: [id number]- PT Re-evaluation, 97750- Physical Performance Testing, 97110-Therapeutic exercises, 97530- Therapeutic activity, [id number]- Neuromuscular re-education, 97535- Self Care, [id number]- Manual therapy, [phone]- Gait training, [phone]- Aquatic Therapy, [phone]- Electrical stimulation (unattended), Patient/Family education, Balance training, Stair training, Taping, DME instructions, Cryotherapy, and Moist heat  PLAN FOR NEXT SESSION: Check goals standing step ups, lateral and forward, hip abd . review home exercise program, should follow hip precautions until she sees MD 8/18.  manual therapy.   Delon Norma, PT 12/31/23 3:59 PM Phone: [phone] Fax: [phone]

## 2024-01-01 DIAGNOSIS — F332 Major depressive disorder, recurrent severe without psychotic features: Secondary | ICD-10-CM | POA: Diagnosis not present

## 2024-01-01 DIAGNOSIS — F4312 Post-traumatic stress disorder, chronic: Secondary | ICD-10-CM | POA: Diagnosis not present

## 2024-01-01 DIAGNOSIS — F411 Generalized anxiety disorder: Secondary | ICD-10-CM | POA: Diagnosis not present

## 2024-01-01 NOTE — Therapy (Deleted)
 OUTPATIENT PHYSICAL THERAPY LOWER EXTREMITY NOTE    Patient Name: Brianna [name] MRN: [id number] DOB:02-21-1996, 28 y.o., female Today's Date: 01/01/2024  END OF SESSION:        Past Medical History:  Diagnosis Date   Acute recurrent pansinusitis 10/21/2019   ADHD (attention deficit hyperactivity disorder)    inattentive   Allergy    Anemia    had iron infusions in Fall of 2023   Anxiety    Arthritis    Joints, hips   Asthma    EIA   Bronchitis    Cat allergy due to both airborne and skin contact 05/01/2016   Chronic sore throat 05/01/2016   COVID 2020   Depression    Ehlers-Danlos syndrome    Fibromyalgia    pt denies   Gastroparesis    GERD (gastroesophageal reflux disease)    History of hypotension    taken midodrine  in the past- Spring of 2022   Hypermobile Ehlers-Danlos syndrome    Irregular heart rate    Keratosis pilaris    Migraine    w and w/o auras   PCOS (polycystic ovarian syndrome) 10/26/2022   PONV (postoperative nausea and vomiting)    I can get really cold   PTSD (post-traumatic stress disorder)    Seasonal allergic rhinitis 03/11/2014   Past Surgical History:  Procedure Laterality Date   CHOLECYSTECTOMY  05/09/2021   ESOPHAGOGASTRODUODENOSCOPY ENDOSCOPY     03/2020   HIP ARTHROPLASTY Right    HIP ARTHROSCOPY Right 04/25/2022   Procedure: RIGHT HIP ARTHROSCOPY WITH LABRAL RECONSTRUCTION, ILIOTIBIAL BAND ALLOGRAFT, PSOAS RELEASE;  Surgeon: Genelle Standing, MD;  Location: [facility] OR;  Service: Orthopedics;  Laterality: Right;   HIP SURGERY Right    per pt   JOINT REPLACEMENT     Upcoming THR right hip 09/03/23   LABRAL REPAIR Right 03/02/2021   Procedure: RIGHT HIP ARTHROSCOPY WITH LABRAL REPAIR AND PINCE DEBRIDEMENT;  Surgeon: Genelle Standing, MD;  Location: [facility] OR;  Service: Orthopedics;  Laterality: Right;   SMART PILL PROCEDURE     12/2020   TYMPANOSTOMY TUBE PLACEMENT Bilateral    WISDOM TOOTH EXTRACTION     Patient Active  Problem List   Diagnosis Date Noted   Chronic night sweats 08/21/2023   PCOS (polycystic ovarian syndrome) 10/26/2022   Cervical adenopathy 06/10/2021   Chronic pruritus 06/10/2021   Exercise-induced asthma 06/09/2021   Chronic venous insufficiency 06/03/2021   Orthostatic lightheadedness 06/03/2021   Purple toe syndrome of both feet (HCC) 06/03/2021   Autonomic dysfunction 06/03/2021   Tear of right acetabular labrum 04/13/2021   Migraine headache 03/01/2021   Headache 11/30/2020   Positive ANA (antinuclear antibody) 11/15/2020   Fibromyalgia 11/02/2020   Hair loss 11/02/2020   Muscle spasticity 10/22/2020   Hypermobile Ehlers-Danlos syndrome 10/22/2020   Paresthesia of bilateral legs 10/06/2020   Myalgia 10/06/2020   Chronic pelvic pain in female 10/06/2020   Chronic abdominal pain 10/06/2020   Irritable colon 08/02/2020   Pelvic floor dysfunction 08/02/2020   Irritant dermatitis 08/02/2020   Gastroesophageal reflux disease 06/28/2020   Severe recurrent major depression without psychotic features (HCC) 06/28/2020   Polyarthralgia 06/28/2020   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 06/28/2020   Tachycardia 06/28/2020   History of COVID-19 06/28/2020   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 deficit 06/28/2020   Recurrent major depressive disorder, in full remission (HCC) 06/28/2020   Atypical chest pain 06/22/2020   Dizziness 06/22/2020   Palpitations 06/14/2020   Anxiety 05/04/2020  Internal and external hemorrhoids without complication 04/29/2020   Nondiabetic gastroparesis 03/03/2020   ETD (Eustachian tube dysfunction), bilateral 10/21/2019   Acne vulgaris 03/11/2014   Mild intermittent asthma without complication 03/11/2014   Seasonal allergies 03/11/2014    PCP: Wendolyn Jenkins Jansky MD  REFERRING PROVIDER: Ezzard Redell Ezzard MD  REFERRING DIAG: [phone] (ICD-10-CM) - 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  Rationale for Evaluation and Treatment: Rehabilitation  ONSET DATE: DOS: 10/22/23:   SUBJECTIVE:   SUBJECTIVE STATEMENT: 01/01/2024 Patient feeling overall less pain in Rt ant hip and improved ability to lift leg up for car and bed/chair transfers.      Patient presents physical therapy evaluation status post right anterior hip replacement.  She has a history of 2 labral repairs in the past but after multiple surgical consultations it was found that her hip dysplasia was causing osteoarthritis and significant joint degeneration.  This is a sensitive topic for her because she has been dealing with this problem for several years and wonders how no one found hip dysplasia on her x-ray.  She opted for total hip replacement and overall is pleased with her progress.  She had home health physical therapy for 2 weeks following discharge from hospital.  Overall she has been keeping up with her exercises.  She walks with 1 crutch without incident but cannot limp the only thing I have difficulty with is putting on my socks.  She does have some issues with pain in her proximal anterior thigh and hip.  She has some numbness and tingling around the site.  She is planning to move back into her home this week which is on the first level.  She has not seen the surgeon yet but has an appointment in a few weeks.  She was unclear about any precautions other than what her home health therapist gave her.   PERTINENT HISTORY: Ehlers-Danlos syndrome, symptoms of dysautonomia including palpitations dizziness and heat intolerance  chronic joint and muscle pain PAIN:  Are you having pain? Yes: NPRS scale: 6 -7/10  Pain location: Rt ant hip  Pain description: throbbing, achy  Aggravating factors: standing, walking  Relieving  factors: Changing positions but something that helps the most is an ice pack which she uses frequently  PRECAUTIONS: Anterior hip patient would be wise to follow due to collagen defect due to hypermobile Ehlers-Danlos syndrome  RED FLAGS: None   WEIGHT BEARING RESTRICTIONS: No  FALLS:  Has patient fallen in last 6 months? No  LIVING ENVIRONMENT: Lives with: lives alone Lives in: House/apartment Stairs: No Has following equipment at home: Crutches shower chair   OCCUPATION: [employer] out until 8/11 .   PLOF: Independent  PATIENT GOALS: I want to be walk pain free   NEXT MD VISIT: 7/18 [facility]   OBJECTIVE:  Note: Objective measures were completed at Evaluation unless otherwise noted.  DIAGNOSTIC FINDINGS: see chart ([facility])   PATIENT SURVEYS:   COGNITION: Overall cognitive status: Within functional limits for tasks assessed     SENSATION: Patient reports numbness and tingling near incision site and along the lateral thigh  EDEMA:  Minimal surrounding incision Patient points out a apparent deformity on the right lateral hip just distal to the greater trochanter.  She reports that was present before her total hip replacement but is questioning what  it is it did feel a bit puffy to me but it could also be gluteus atrophy which makes it look a little bit more pronounced  POSTURE: No Significant postural limitations  PALPATION: Tender to palpation along  well-healed incision  LOWER EXTREMITY MNF:[facility] hip ER/IR   Active ROM Right eval Left eval  Hip flexion P90 P 120  Hip extension    Hip abduction    Hip adduction    Hip internal rotation A24,P25 A35, P30  Hip external rotation A3, P10 A30, P50   Knee flexion    Knee extension    Ankle dorsiflexion    Ankle plantarflexion    Ankle inversion    Ankle eversion     (Blank rows = not tested)  LOWER EXTREMITY MMT:  MMT Right eval Left eval  Hip flexion 4 5  Hip extension    Hip abduction    Hip adduction     Hip internal rotation 4 4  Hip external rotation 3+ 4  Knee flexion 4+ 5  Knee extension 5 5  Ankle dorsiflexion    Ankle plantarflexion    Ankle inversion    Ankle eversion     (Blank rows = not tested)    FUNCTIONAL TESTS:  5 times sit to stand: 16 seconds 30 seconds chair stand test, 9 repetitions  GAIT: Distance walked: 100 feet Assistive device utilized: Crutches x1 Level of assistance: Complete Independence Comments: Patient without appreciable limp for the first 75 feet, slower more cautious pace                                                                                                                                TREATMENT DATE:   [facility] Adult PT Treatment:                                                DATE: 01/02/24 Therapeutic Exercise: *** Manual Therapy: *** Neuromuscular re-ed: *** Therapeutic Activity: *** Modalities: *** Self Care: ***  [facility] Adult PT Treatment:                                                DATE: 12/31/23 Therapeutic Exercise: Recumbent bike L2 for 6 min  Wall sit 30 sec x 3 toes, then heels holding ball  Supported squat x 10 then squat x 10 no UEs Ant hip stretch gentle Rt LE back  Step up to high knee 4 inch  Reverse step up for glute med Each side x 10  Glute med drops x 15 off 4 inch step  Supine hip flexion AAROM  Rt knee ext/flex with blue band added straight leg arc with blue band  Butterfly stretch gentle  LTR knees wide  Manual Therapy: LAD, Rt hip, thigh soft tissue work , IASTM along scar tissue, gentle pressure applied   [facility] Adult PT Treatment:                                                DATE: 12/26/23 Therapeutic Exercise: Supine quad, glute set SAQ 4 lbs x 15  Used towel for assisted hip flexion with small circles Rt LE bent knee fall out x 10 controlled  Hip rotations knees together and knees apart Bridging x 10  Manual Therapy: Supine hip flexor stretch 2 x 30 sec LAD grade I-II with  oscillations IASTM thigh in progressively stretched position Prone quad stretch, PROM ER and IR  Self Care: Try TENS, add cold pack  Avoid over aggravating hip flexion for now.     [facility] Adult PT Treatment:                                                DATE: 12/24/23 MTPR along distal iliopsoas, TFL  STW along glute med Supine hip flexor stretch 2 x 30 sec LAD grade I-II with oscillations Heel slides 1 x 8 Supine marching 1 x 10 marching with heel raise progressed to 1 x 8 light hip flexor Bridge bil LE 2 x 10  Adjusted seating in vehicle to maximize function/ reduce stress.    PATIENT EDUCATION:  Education details: hip precautions, POC, HEP  Person educated: Patient Education method: Explanation, Demonstration, Verbal cues, and Handouts Education comprehension: verbalized understanding, returned demonstration, and needs further education  HOME EXERCISE PROGRAM: Access Code: [email or web address] URL: [email or web address] Date: 11/20/2023 Prepared by: Delon Norma  Exercises - Supine Quad Set  - 1 x daily - 7 x weekly - 2 sets - 10 reps - 30 hold - Supine Bridge  - 1 x daily - 7 x weekly - 2 sets - 10 reps - 30 hold (SMALL RANGE OF MOTION)  - Sit to Stand  - 1 x daily - 7 x weekly - 2 sets - 10 reps - 5 hold - Standing Hip Abduction with Counter Support  - 1 x daily - 7 x weekly - 2 sets - 10 reps - 5 hold (TOE FORWARD)  - Standing Marching  - 1 x daily - 7 x weekly - 2 sets - 10 reps - 5 hold  ASSESSMENT:  CLINICAL IMPRESSION:  Patient did receive/purchase a hip spica to provide more stability and comfort in standing and walking.  She was able to tolerate some more standing today and showed increased active assisted and active range of motion in her right hip flexion.  Pain with right AROM in ER and IR as well.  Therapy is not pushing toward the end range of any of these motions just trying mobility.  She may benefit from an upgrade of her home exercise  program.  OBJECTIVE IMPAIRMENTS: decreased activity tolerance, decreased endurance, decreased knowledge of use of DME, decreased mobility, difficulty walking, decreased ROM, decreased strength, increased edema, increased fascial restrictions, increased muscle spasms, impaired sensation, and pain.   ACTIVITY LIMITATIONS: carrying, lifting, bending, sitting, standing, squatting, sleeping, transfers, bed mobility, dressing, and locomotion level  PARTICIPATION LIMITATIONS: cleaning, driving,  shopping, community activity, and occupation  PERSONAL FACTORS: Past/current experiences, Transportation, and 1-2 comorbidities: EDS< previous hip surgeries are also affecting patient's functional outcome.   REHAB POTENTIAL: Excellent  CLINICAL DECISION MAKING: Evolving/moderate complexity  EVALUATION COMPLEXITY: Moderate   GOALS: Goals reviewed with patient? Yes  SHORT TERM GOALS: Target date: 12/18/2023   Patient will be able to show independence for initial HEP to include posture, core and hip strength and stability.   Baseline: Goal status: Met  2.  Patient will be I with concepts of joint protection, hip precautions and stability as it pertains to joint hypermobility. Baseline:  Goal status: Met  3.  Patient will be able to ambulate in the clinic without the use of crutch and no discernible limp 300 feet  Baseline:  Goal status: MET   LONG TERM GOALS: Target date: 01/15/2024    LEFS score will increase by 16 points indicating improved functional mobility Baseline: 19/80 Goal status: INITIAL  2.  Patient will be independent with home exercise program upon discharge from physical therapy Baseline:  Goal status: INITIAL  3.  Patient will be able to walk without assistive device and pain no more than 2/10 In the right hip community distances > 1000 feet  Baseline:  Goal status: INITIAL  4.  Patient will demonstrate a pain free squat in order to complete transfers repetitively and  obtain items below the waist for work and home tasks Baseline:  Goal status: INITIAL  5.  Patient will be able to demonstrate hip/knee/ankle strength to 5/5 in order to maximize functional mobility, ambulation and lifting.   Baseline:  Goal status: INITIAL  PLAN:  PT FREQUENCY: 2x/week  PT DURATION: 8 weeks  PLANNED INTERVENTIONS: 97164- PT Re-evaluation, 97750- Physical Performance Testing, 97110-Therapeutic exercises, 97530- Therapeutic activity, [id number]- Neuromuscular re-education, 97535- Self Care, [id number]- Manual therapy, [phone]- Gait training, [phone]- Aquatic Therapy, [phone]- Electrical stimulation (unattended), Patient/Family education, Balance training, Stair training, Taping, DME instructions, Cryotherapy, and Moist heat  PLAN FOR NEXT SESSION: Check goals standing step ups, lateral and forward, hip abd . review home exercise program, should follow hip precautions until she sees MD 8/18.  manual therapy.   Delon Norma, PT 01/01/24 1:15 PM Phone: [phone] Fax: [phone]

## 2024-01-01 NOTE — Therapy (Unsigned)
 OUTPATIENT PHYSICAL THERAPY LOWER EXTREMITY NOTE    Patient Name: Brianna [name] MRN: [id number] DOB:02-21-1996, 28 y.o., female Today's Date: 01/01/2024  END OF SESSION:        Past Medical History:  Diagnosis Date   Acute recurrent pansinusitis 10/21/2019   ADHD (attention deficit hyperactivity disorder)    inattentive   Allergy    Anemia    had iron infusions in Fall of 2023   Anxiety    Arthritis    Joints, hips   Asthma    EIA   Bronchitis    Cat allergy due to both airborne and skin contact 05/01/2016   Chronic sore throat 05/01/2016   COVID 2020   Depression    Ehlers-Danlos syndrome    Fibromyalgia    pt denies   Gastroparesis    GERD (gastroesophageal reflux disease)    History of hypotension    taken midodrine  in the past- Spring of 2022   Hypermobile Ehlers-Danlos syndrome    Irregular heart rate    Keratosis pilaris    Migraine    w and w/o auras   PCOS (polycystic ovarian syndrome) 10/26/2022   PONV (postoperative nausea and vomiting)    I can get really cold   PTSD (post-traumatic stress disorder)    Seasonal allergic rhinitis 03/11/2014   Past Surgical History:  Procedure Laterality Date   CHOLECYSTECTOMY  05/09/2021   ESOPHAGOGASTRODUODENOSCOPY ENDOSCOPY     03/2020   HIP ARTHROPLASTY Right    HIP ARTHROSCOPY Right 04/25/2022   Procedure: RIGHT HIP ARTHROSCOPY WITH LABRAL RECONSTRUCTION, ILIOTIBIAL BAND ALLOGRAFT, PSOAS RELEASE;  Surgeon: Genelle Standing, MD;  Location: [facility] OR;  Service: Orthopedics;  Laterality: Right;   HIP SURGERY Right    per pt   JOINT REPLACEMENT     Upcoming THR right hip 09/03/23   LABRAL REPAIR Right 03/02/2021   Procedure: RIGHT HIP ARTHROSCOPY WITH LABRAL REPAIR AND PINCE DEBRIDEMENT;  Surgeon: Genelle Standing, MD;  Location: [facility] OR;  Service: Orthopedics;  Laterality: Right;   SMART PILL PROCEDURE     12/2020   TYMPANOSTOMY TUBE PLACEMENT Bilateral    WISDOM TOOTH EXTRACTION     Patient Active  Problem List   Diagnosis Date Noted   Chronic night sweats 08/21/2023   PCOS (polycystic ovarian syndrome) 10/26/2022   Cervical adenopathy 06/10/2021   Chronic pruritus 06/10/2021   Exercise-induced asthma 06/09/2021   Chronic venous insufficiency 06/03/2021   Orthostatic lightheadedness 06/03/2021   Purple toe syndrome of both feet (HCC) 06/03/2021   Autonomic dysfunction 06/03/2021   Tear of right acetabular labrum 04/13/2021   Migraine headache 03/01/2021   Headache 11/30/2020   Positive ANA (antinuclear antibody) 11/15/2020   Fibromyalgia 11/02/2020   Hair loss 11/02/2020   Muscle spasticity 10/22/2020   Hypermobile Ehlers-Danlos syndrome 10/22/2020   Paresthesia of bilateral legs 10/06/2020   Myalgia 10/06/2020   Chronic pelvic pain in female 10/06/2020   Chronic abdominal pain 10/06/2020   Irritable colon 08/02/2020   Pelvic floor dysfunction 08/02/2020   Irritant dermatitis 08/02/2020   Gastroesophageal reflux disease 06/28/2020   Severe recurrent major depression without psychotic features (HCC) 06/28/2020   Polyarthralgia 06/28/2020   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 06/28/2020   Tachycardia 06/28/2020   History of COVID-19 06/28/2020   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 deficit 06/28/2020   Recurrent major depressive disorder, in full remission (HCC) 06/28/2020   Atypical chest pain 06/22/2020   Dizziness 06/22/2020   Palpitations 06/14/2020   Anxiety 05/04/2020  Internal and external hemorrhoids without complication 04/29/2020   Nondiabetic gastroparesis 03/03/2020   ETD (Eustachian tube dysfunction), bilateral 10/21/2019   Acne vulgaris 03/11/2014   Mild intermittent asthma without complication 03/11/2014   Seasonal allergies 03/11/2014    PCP: Wendolyn Jenkins Jansky MD  REFERRING PROVIDER: Ezzard Redell Ezzard MD  REFERRING DIAG: [phone] (ICD-10-CM) - 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  Rationale for Evaluation and Treatment: Rehabilitation  ONSET DATE: DOS: 10/22/23:   SUBJECTIVE:   SUBJECTIVE STATEMENT: 01/01/2024 Patient feeling overall less pain in Rt ant hip and improved ability to lift leg up for car and bed/chair transfers.      Patient presents physical therapy evaluation status post right anterior hip replacement.  She has a history of 2 labral repairs in the past but after multiple surgical consultations it was found that her hip dysplasia was causing osteoarthritis and significant joint degeneration.  This is a sensitive topic for her because she has been dealing with this problem for several years and wonders how no one found hip dysplasia on her x-ray.  She opted for total hip replacement and overall is pleased with her progress.  She had home health physical therapy for 2 weeks following discharge from hospital.  Overall she has been keeping up with her exercises.  She walks with 1 crutch without incident but cannot limp the only thing I have difficulty with is putting on my socks.  She does have some issues with pain in her proximal anterior thigh and hip.  She has some numbness and tingling around the site.  She is planning to move back into her home this week which is on the first level.  She has not seen the surgeon yet but has an appointment in a few weeks.  She was unclear about any precautions other than what her home health therapist gave her.   PERTINENT HISTORY: Ehlers-Danlos syndrome, symptoms of dysautonomia including palpitations dizziness and heat intolerance  chronic joint and muscle pain PAIN:  Are you having pain? Yes: NPRS scale: 6 -7/10  Pain location: Rt ant hip  Pain description: throbbing, achy  Aggravating factors: standing, walking  Relieving  factors: Changing positions but something that helps the most is an ice pack which she uses frequently  PRECAUTIONS: Anterior hip patient would be wise to follow due to collagen defect due to hypermobile Ehlers-Danlos syndrome  RED FLAGS: None   WEIGHT BEARING RESTRICTIONS: No  FALLS:  Has patient fallen in last 6 months? No  LIVING ENVIRONMENT: Lives with: lives alone Lives in: House/apartment Stairs: No Has following equipment at home: Crutches shower chair   OCCUPATION: [employer] out until 8/11 .   PLOF: Independent  PATIENT GOALS: I want to be walk pain free   NEXT MD VISIT: 7/18 [facility]   OBJECTIVE:  Note: Objective measures were completed at Evaluation unless otherwise noted.  DIAGNOSTIC FINDINGS: see chart ([facility])   PATIENT SURVEYS:   COGNITION: Overall cognitive status: Within functional limits for tasks assessed     SENSATION: Patient reports numbness and tingling near incision site and along the lateral thigh  EDEMA:  Minimal surrounding incision Patient points out a apparent deformity on the right lateral hip just distal to the greater trochanter.  She reports that was present before her total hip replacement but is questioning what  it is it did feel a bit puffy to me but it could also be gluteus atrophy which makes it look a little bit more pronounced  POSTURE: No Significant postural limitations  PALPATION: Tender to palpation along  well-healed incision  LOWER EXTREMITY MNF:[facility] hip ER/IR   Active ROM Right eval Left eval  Hip flexion P90 P 120  Hip extension    Hip abduction    Hip adduction    Hip internal rotation A24,P25 A35, P30  Hip external rotation A3, P10 A30, P50   Knee flexion    Knee extension    Ankle dorsiflexion    Ankle plantarflexion    Ankle inversion    Ankle eversion     (Blank rows = not tested)  LOWER EXTREMITY MMT:  MMT Right eval Left eval  Hip flexion 4 5  Hip extension    Hip abduction    Hip adduction     Hip internal rotation 4 4  Hip external rotation 3+ 4  Knee flexion 4+ 5  Knee extension 5 5  Ankle dorsiflexion    Ankle plantarflexion    Ankle inversion    Ankle eversion     (Blank rows = not tested)    FUNCTIONAL TESTS:  5 times sit to stand: 16 seconds 30 seconds chair stand test, 9 repetitions  GAIT: Distance walked: 100 feet Assistive device utilized: Crutches x1 Level of assistance: Complete Independence Comments: Patient without appreciable limp for the first 75 feet, slower more cautious pace                                                                                                                                TREATMENT DATE:   [facility] Adult PT Treatment:                                                DATE: 01/02/24 Therapeutic Exercise: *** Manual Therapy: *** Neuromuscular re-ed: *** Therapeutic Activity: *** Modalities: *** Self Care: ***  [facility] Adult PT Treatment:                                                DATE: 12/31/23 Therapeutic Exercise: Recumbent bike L2 for 6 min  Wall sit 30 sec x 3 toes, then heels holding ball  Supported squat x 10 then squat x 10 no UEs Ant hip stretch gentle Rt LE back  Step up to high knee 4 inch  Reverse step up for glute med Each side x 10  Glute med drops x 15 off 4 inch step  Supine hip flexion AAROM  Rt knee ext/flex with blue band added straight leg arc with blue band  Butterfly stretch gentle  LTR knees wide  Manual Therapy: LAD, Rt hip, thigh soft tissue work , IASTM along scar tissue, gentle pressure applied   [facility] Adult PT Treatment:                                                DATE: 12/26/23 Therapeutic Exercise: Supine quad, glute set SAQ 4 lbs x 15  Used towel for assisted hip flexion with small circles Rt LE bent knee fall out x 10 controlled  Hip rotations knees together and knees apart Bridging x 10  Manual Therapy: Supine hip flexor stretch 2 x 30 sec LAD grade I-II with  oscillations IASTM thigh in progressively stretched position Prone quad stretch, PROM ER and IR  Self Care: Try TENS, add cold pack  Avoid over aggravating hip flexion for now.     [facility] Adult PT Treatment:                                                DATE: 12/24/23 MTPR along distal iliopsoas, TFL  STW along glute med Supine hip flexor stretch 2 x 30 sec LAD grade I-II with oscillations Heel slides 1 x 8 Supine marching 1 x 10 marching with heel raise progressed to 1 x 8 light hip flexor Bridge bil LE 2 x 10  Adjusted seating in vehicle to maximize function/ reduce stress.    PATIENT EDUCATION:  Education details: hip precautions, POC, HEP  Person educated: Patient Education method: Explanation, Demonstration, Verbal cues, and Handouts Education comprehension: verbalized understanding, returned demonstration, and needs further education  HOME EXERCISE PROGRAM: Access Code: [email or web address] URL: [email or web address] Date: 11/20/2023 Prepared by: Delon Norma  Exercises - Supine Quad Set  - 1 x daily - 7 x weekly - 2 sets - 10 reps - 30 hold - Supine Bridge  - 1 x daily - 7 x weekly - 2 sets - 10 reps - 30 hold (SMALL RANGE OF MOTION)  - Sit to Stand  - 1 x daily - 7 x weekly - 2 sets - 10 reps - 5 hold - Standing Hip Abduction with Counter Support  - 1 x daily - 7 x weekly - 2 sets - 10 reps - 5 hold (TOE FORWARD)  - Standing Marching  - 1 x daily - 7 x weekly - 2 sets - 10 reps - 5 hold  ASSESSMENT:  CLINICAL IMPRESSION:  Patient did receive/purchase a hip spica to provide more stability and comfort in standing and walking.  She was able to tolerate some more standing today and showed increased active assisted and active range of motion in her right hip flexion.  Pain with right AROM in ER and IR as well.  Therapy is not pushing toward the end range of any of these motions just trying mobility.  She may benefit from an upgrade of her home exercise  program.  OBJECTIVE IMPAIRMENTS: decreased activity tolerance, decreased endurance, decreased knowledge of use of DME, decreased mobility, difficulty walking, decreased ROM, decreased strength, increased edema, increased fascial restrictions, increased muscle spasms, impaired sensation, and pain.   ACTIVITY LIMITATIONS: carrying, lifting, bending, sitting, standing, squatting, sleeping, transfers, bed mobility, dressing, and locomotion level  PARTICIPATION LIMITATIONS: cleaning, driving,  shopping, community activity, and occupation  PERSONAL FACTORS: Past/current experiences, Transportation, and 1-2 comorbidities: EDS< previous hip surgeries are also affecting patient's functional outcome.   REHAB POTENTIAL: Excellent  CLINICAL DECISION MAKING: Evolving/moderate complexity  EVALUATION COMPLEXITY: Moderate   GOALS: Goals reviewed with patient? Yes  SHORT TERM GOALS: Target date: 12/18/2023   Patient will be able to show independence for initial HEP to include posture, core and hip strength and stability.   Baseline: Goal status: Met  2.  Patient will be I with concepts of joint protection, hip precautions and stability as it pertains to joint hypermobility. Baseline:  Goal status: Met  3.  Patient will be able to ambulate in the clinic without the use of crutch and no discernible limp 300 feet  Baseline:  Goal status: MET   LONG TERM GOALS: Target date: 01/15/2024    LEFS score will increase by 16 points indicating improved functional mobility Baseline: 19/80 Goal status: INITIAL  2.  Patient will be independent with home exercise program upon discharge from physical therapy Baseline:  Goal status: INITIAL  3.  Patient will be able to walk without assistive device and pain no more than 2/10 In the right hip community distances > 1000 feet  Baseline:  Goal status: INITIAL  4.  Patient will demonstrate a pain free squat in order to complete transfers repetitively and  obtain items below the waist for work and home tasks Baseline:  Goal status: INITIAL  5.  Patient will be able to demonstrate hip/knee/ankle strength to 5/5 in order to maximize functional mobility, ambulation and lifting.   Baseline:  Goal status: INITIAL  PLAN:  PT FREQUENCY: 2x/week  PT DURATION: 8 weeks  PLANNED INTERVENTIONS: [id number]- PT Re-evaluation, 97750- Physical Performance Testing, 97110-Therapeutic exercises, 97530- Therapeutic activity, [id number]- Neuromuscular re-education, 97535- Self Care, [id number]- Manual therapy, [phone]- Gait training, [phone]- Aquatic Therapy, [phone]- Electrical stimulation (unattended), Patient/Family education, Balance training, Stair training, Taping, DME instructions, Cryotherapy, and Moist heat  PLAN FOR NEXT SESSION: Check goals standing step ups, lateral and forward, hip abd . review home exercise program, should follow hip precautions until she sees MD 8/18.  manual therapy.   Delon Norma, PT 01/01/24 1:15 PM Phone: [phone] Fax: [phone]

## 2024-01-02 ENCOUNTER — Ambulatory Visit: Admitting: Physical Therapy

## 2024-01-03 DIAGNOSIS — F332 Major depressive disorder, recurrent severe without psychotic features: Secondary | ICD-10-CM | POA: Diagnosis not present

## 2024-01-07 ENCOUNTER — Ambulatory Visit: Admitting: Physical Therapy

## 2024-01-07 DIAGNOSIS — L858 Other specified epidermal thickening: Secondary | ICD-10-CM | POA: Diagnosis not present

## 2024-01-07 DIAGNOSIS — L501 Idiopathic urticaria: Secondary | ICD-10-CM | POA: Diagnosis not present

## 2024-01-07 DIAGNOSIS — M25551 Pain in right hip: Secondary | ICD-10-CM

## 2024-01-07 DIAGNOSIS — L503 Dermatographic urticaria: Secondary | ICD-10-CM | POA: Diagnosis not present

## 2024-01-07 DIAGNOSIS — L299 Pruritus, unspecified: Secondary | ICD-10-CM | POA: Diagnosis not present

## 2024-01-07 DIAGNOSIS — R231 Pallor: Secondary | ICD-10-CM | POA: Diagnosis not present

## 2024-01-07 DIAGNOSIS — R232 Flushing: Secondary | ICD-10-CM | POA: Diagnosis not present

## 2024-01-07 DIAGNOSIS — M6281 Muscle weakness (generalized): Secondary | ICD-10-CM | POA: Diagnosis not present

## 2024-01-07 DIAGNOSIS — M357 Hypermobility syndrome: Secondary | ICD-10-CM

## 2024-01-07 DIAGNOSIS — Z96641 Presence of right artificial hip joint: Secondary | ICD-10-CM | POA: Diagnosis not present

## 2024-01-07 NOTE — Therapy (Signed)
 80 OUTPATIENT PHYSICAL THERAPY LOWER EXTREMITY NOTE    Patient Name: Brianna [name] MRN: [id number] DOB:[birth year], 28 y.o., female Today's Date: 01/07/2024  END OF SESSION:  PT End of Session - 01/07/24 1339     Visit Number 13    Number of Visits 16    Date for PT Re-Evaluation 01/15/24    Authorization Type [employer]    PT Start Time 1334    PT Stop Time 1415    PT Time Calculation (min) 41 min    Activity Tolerance Patient tolerated treatment well    Behavior During Therapy [facility] for tasks assessed/performed               Past Medical History:  Diagnosis Date   Acute recurrent pansinusitis 10/21/2019   ADHD (attention deficit hyperactivity disorder)    inattentive   Allergy    Anemia    had iron infusions in Fall of 2023   Anxiety    Arthritis    Joints, hips   Asthma    EIA   Bronchitis    Cat allergy due to both airborne and skin contact 05/01/2016   Chronic sore throat 05/01/2016   COVID 2020   Depression    Ehlers-Danlos syndrome    Fibromyalgia    pt denies   Gastroparesis    GERD (gastroesophageal reflux disease)    History of hypotension    taken midodrine  in the past- Spring of 2022   Hypermobile Ehlers-Danlos syndrome    Irregular heart rate    Keratosis pilaris    Migraine    w and w/o auras   PCOS (polycystic ovarian syndrome) 10/26/2022   PONV (postoperative nausea and vomiting)    I can get really cold   PTSD (post-traumatic stress disorder)    Seasonal allergic rhinitis 03/11/2014   Past Surgical History:  Procedure Laterality Date   CHOLECYSTECTOMY  05/09/2021   ESOPHAGOGASTRODUODENOSCOPY ENDOSCOPY     03/2020   HIP ARTHROPLASTY Right    HIP ARTHROSCOPY Right 04/25/2022   Procedure: RIGHT HIP ARTHROSCOPY WITH LABRAL RECONSTRUCTION, ILIOTIBIAL BAND ALLOGRAFT, PSOAS RELEASE;  Surgeon: Genelle Standing, MD;  Location: [facility] OR;  Service: Orthopedics;  Laterality: Right;   HIP SURGERY Right    per pt   JOINT REPLACEMENT      Upcoming THR right hip 09/03/23   LABRAL REPAIR Right 03/02/2021   Procedure: RIGHT HIP ARTHROSCOPY WITH LABRAL REPAIR AND PINCE DEBRIDEMENT;  Surgeon: Genelle Standing, MD;  Location: [facility] OR;  Service: Orthopedics;  Laterality: Right;   SMART PILL PROCEDURE     12/2020   TYMPANOSTOMY TUBE PLACEMENT Bilateral    WISDOM TOOTH EXTRACTION     Patient Active Problem List   Diagnosis Date Noted   Chronic night sweats 08/21/2023   PCOS (polycystic ovarian syndrome) 10/26/2022   Cervical adenopathy 06/10/2021   Chronic pruritus 06/10/2021   Exercise-induced asthma 06/09/2021   Chronic venous insufficiency 06/03/2021   Orthostatic lightheadedness 06/03/2021   Purple toe syndrome of both feet (HCC) 06/03/2021   Autonomic dysfunction 06/03/2021   Tear of right acetabular labrum 04/13/2021   Migraine headache 03/01/2021   Headache 11/30/2020   Positive ANA (antinuclear antibody) 11/15/2020   Fibromyalgia 11/02/2020   Hair loss 11/02/2020   Muscle spasticity 10/22/2020   Hypermobile Ehlers-Danlos syndrome 10/22/2020   Paresthesia of bilateral legs 10/06/2020   Myalgia 10/06/2020   Chronic pelvic pain in female 10/06/2020   Chronic abdominal pain 10/06/2020   Irritable colon 08/02/2020   Pelvic floor dysfunction 08/02/2020  Irritant dermatitis 08/02/2020   Gastroesophageal reflux disease 06/28/2020   Severe recurrent major depression without psychotic features (HCC) 06/28/2020   Polyarthralgia 06/28/2020   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 06/28/2020   Tachycardia 06/28/2020   History of COVID-19 06/28/2020   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 deficit 06/28/2020   Recurrent major depressive disorder, in full remission (HCC) 06/28/2020   Atypical chest pain 06/22/2020   Dizziness 06/22/2020   Palpitations 06/14/2020   Anxiety 05/04/2020   Internal and external hemorrhoids without complication 04/29/2020   Nondiabetic gastroparesis 03/03/2020    ETD (Eustachian tube dysfunction), bilateral 10/21/2019   Acne vulgaris 03/11/2014   Mild intermittent asthma without complication 03/11/2014   Seasonal allergies 03/11/2014    PCP: Wendolyn Jenkins Jansky MD  REFERRING PROVIDER: Ezzard Redell Ezzard MD  REFERRING DIAG: M16.31 (ICD-10-CM) - 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  It is still excruciating.  Sees Dr. Genelle     Patient presents physical therapy evaluation status post right anterior hip replacement.  She has a history of 2 labral repairs in the past but after multiple surgical consultations it was found that her hip dysplasia was causing osteoarthritis and significant joint degeneration.  This is a sensitive topic for her because she has been dealing with this problem for several years and wonders how no one found hip dysplasia on her x-ray.  She opted for total hip replacement and overall is pleased with her progress.  She had home health physical therapy for 2 weeks following discharge from hospital.  Overall she has been keeping up with her exercises.  She walks with 1 crutch without incident but cannot limp the only thing I have difficulty with is putting on my socks.  She does have some issues with pain in her proximal anterior thigh and hip.  She has some numbness and tingling around the site.  She is planning to move back into her home this week which is on the first level.  She has not seen the surgeon yet but has an appointment in a few weeks.  She was unclear about any  precautions other than what her home health therapist gave her.   PERTINENT HISTORY: Ehlers-Danlos syndrome, symptoms of dysautonomia including palpitations dizziness and heat intolerance  chronic joint and muscle pain PAIN:  Are you having pain? Yes: NPRS scale: 6 -7/10  Pain location: Rt ant hip  Pain description: throbbing, achy  Aggravating factors: standing, walking  Relieving factors: Changing positions but something that helps the most is an ice pack which she uses frequently  PRECAUTIONS: Anterior hip patient would be wise to follow due to collagen defect due to hypermobile Ehlers-Danlos syndrome  RED FLAGS: None   WEIGHT BEARING RESTRICTIONS: No  FALLS:  Has patient fallen in last 6 months? No  LIVING ENVIRONMENT: Lives with: lives alone Lives in: House/apartment Stairs: No Has following equipment at home: Crutches shower chair   OCCUPATION: [employer] out until 8/11 .   PLOF: Independent  PATIENT GOALS: I want to be walk pain free   NEXT MD VISIT: 7/18 [facility]   OBJECTIVE:  Note: Objective measures were completed at Evaluation unless otherwise noted.  DIAGNOSTIC FINDINGS: see chart ([facility])   PATIENT SURVEYS:   COGNITION: Overall cognitive status: Within functional limits for tasks assessed     SENSATION: Patient reports numbness and tingling near incision site and along the lateral thigh  EDEMA:  Minimal surrounding incision Patient points out a apparent deformity on the right lateral hip just distal to the greater trochanter.  She reports that was present before her total hip replacement but is questioning what it is it did feel a bit puffy to me but it could also be gluteus atrophy which makes it look a little bit more pronounced  POSTURE: No Significant postural limitations  PALPATION: Tender to palpation along  well-healed incision  LOWER EXTREMITY MNF:[facility] hip ER/IR   Active ROM Right eval Left eval Rt 01/07/24  Hip flexion P90 P 120 115 Pain   Hip extension     Hip abduction     Hip adduction     Hip internal rotation A24,P25 A35, P30 27 Pain   Hip external rotation A3, P10 A30, P50  47 Pain   Knee flexion     Knee extension     Ankle dorsiflexion     Ankle plantarflexion     Ankle inversion     Ankle eversion      (Blank rows = not tested)  LOWER EXTREMITY MMT:  MMT Right eval Left eval Rt  01/07/24  Hip flexion 4 5 NT  (3-/5?)  Hip extension   4/5  Hip abduction     Hip adduction   4/5  Hip internal rotation 4 4   Hip external rotation 3+ 4   Knee flexion 4+ 5   Knee extension 5 5   Ankle dorsiflexion     Ankle plantarflexion     Ankle inversion     Ankle eversion      (Blank rows = not tested)    FUNCTIONAL TESTS:  5 times sit to stand: 16 seconds 30 seconds chair stand test, 9 repetitions  GAIT: Distance walked: 100 feet Assistive device utilized: Crutches x1 Level of assistance: Complete Independence Comments: Patient without appreciable limp for the first 75 feet, slower more cautious pace                                                                                                                                TREATMENT DATE:   [facility] Adult PT Treatment:                                                DATE: 01/07/24 Therapeutic Exercise: Recumbent bike, NuStep for 6 min  Rt LE off edge of table  Bridging x  10  Single leg bridge x 10 pain in Rt leg when crossed on  L  Hip abduction  Hip extension 2 x 10 knee ext and knee flexed x 10  Quad stretch with strap 1/2 kneeling ant hip stretch for Rt psoas  x 30 sec x 3  Self Care: Self release of iliopsoas   Psoas pin and stretch  US  , next visit with Genelle [facility] Adult PT Treatment:                                                DATE: 12/31/23 Therapeutic Exercise: Recumbent bike L2 for 6 min  Wall sit 30 sec x 3 toes, then heels holding ball  Supported squat x 10 then squat x 10 no UEs Ant hip stretch gentle Rt LE back  Step up to  high knee 4 inch  Reverse step up for glute med Each side x 10  Glute med drops x 15 off 4 inch step  Supine hip flexion AAROM  Rt knee ext/flex with blue band added straight leg arc with blue band  Butterfly stretch gentle  LTR knees wide  Manual Therapy: LAD, Rt hip, thigh soft tissue work , IASTM along scar tissue, gentle pressure applied   [facility] Adult PT Treatment:                                                DATE: 12/26/23 Therapeutic Exercise: Supine quad, glute set SAQ 4 lbs x 15  Used towel for assisted hip flexion with small circles Rt LE bent knee fall out x 10 controlled  Hip rotations knees together and knees apart Bridging x 10  Manual Therapy: Supine hip flexor stretch 2 x 30 sec LAD grade I-II with oscillations IASTM thigh in progressively stretched position Prone quad stretch, PROM ER and IR  Self Care: Try TENS, add cold pack  Avoid over aggravating hip flexion for now.     [facility] Adult PT Treatment:                                                DATE: 12/24/23 MTPR along distal iliopsoas, TFL  STW along glute med Supine hip flexor stretch 2 x 30 sec LAD grade I-II with oscillations Heel slides 1 x 8 Supine marching 1 x 10 marching with heel raise progressed to 1 x 8 light hip flexor Bridge bil LE 2 x 10  Adjusted seating in vehicle to maximize function/ reduce stress.    PATIENT EDUCATION:  Education details: hip precautions, POC, HEP  Person educated: Patient Education method: Explanation, Demonstration, Verbal cues, and Handouts Education comprehension: verbalized understanding, returned demonstration, and needs further education  HOME EXERCISE PROGRAM: Access Code: [email or web address] URL: [email or web address] Date: 11/20/2023 Prepared by: Delon Norma  Exercises - Supine Quad Set  - 1 x daily - 7 x weekly - 2 sets - 10 reps - 30 hold - Supine Bridge  - 1 x daily - 7 x weekly - 2 sets - 10 reps - 30 hold (SMALL RANGE OF MOTION)  - Sit to  Stand  - 1 x daily - 7 x weekly - 2 sets -  10 reps - 5 hold - Standing Hip Abduction with Counter Support  - 1 x daily - 7 x weekly - 2 sets - 10 reps - 5 hold (TOE FORWARD)  - Standing Marching  - 1 x daily - 7 x weekly - 2 sets - 10 reps - 5 hold  ASSESSMENT:  CLINICAL IMPRESSION:  Patient with continued right groin pain since 728 when she felt a pop getting in to her car.  The pain is consistent with iliopsoas strain, may have some pectineus components as well.  Encouraged her to continue to perform active assisted range of motion and strengthen the abductors and the extensors of the hip.  The recumbent bike is too painful for her.  She has improved strength and range of motion of her right total hip replacement.  We she will continue to benefit from skilled physical therapy in order to improve activity tolerance to facilitate return to work which is happening next week.      OBJECTIVE IMPAIRMENTS: decreased activity tolerance, decreased endurance, decreased knowledge of use of DME, decreased mobility, difficulty walking, decreased ROM, decreased strength, increased edema, increased fascial restrictions, increased muscle spasms, impaired sensation, and pain.   ACTIVITY LIMITATIONS: carrying, lifting, bending, sitting, standing, squatting, sleeping, transfers, bed mobility, dressing, and locomotion level  PARTICIPATION LIMITATIONS: cleaning, driving, shopping, community activity, and occupation  PERSONAL FACTORS: Past/current experiences, Transportation, and 1-2 comorbidities: EDS< previous hip surgeries are also affecting patient's functional outcome.   REHAB POTENTIAL: Excellent  CLINICAL DECISION MAKING: Evolving/moderate complexity  EVALUATION COMPLEXITY: Moderate   GOALS: Goals reviewed with patient? Yes  SHORT TERM GOALS: Target date: 12/18/2023   Patient will be able to show independence for initial HEP to include posture, core and hip strength and stability.    Baseline: Goal status: Met  2.  Patient will be I with concepts of joint protection, hip precautions and stability as it pertains to joint hypermobility. Baseline:  Goal status: Met  3.  Patient will be able to ambulate in the clinic without the use of crutch and no discernible limp 300 feet  Baseline:  Goal status: MET   LONG TERM GOALS: Target date: 01/15/2024    LEFS score will increase by 16 points indicating improved functional mobility Baseline: 19/80 Goal status: INITIAL  2.  Patient will be independent with home exercise program upon discharge from physical therapy Baseline:  Goal status: INITIAL  3.  Patient will be able to walk without assistive device and pain no more than 2/10 In the right hip community distances > 1000 feet  Baseline:  Goal status: INITIAL  4.  Patient will demonstrate a pain free squat in order to complete transfers repetitively and obtain items below the waist for work and home tasks Baseline:  Goal status: INITIAL  5.  Patient will be able to demonstrate hip/knee/ankle strength to 5/5 in order to maximize functional mobility, ambulation and lifting.   Baseline:  Goal status: INITIAL  PLAN:  PT FREQUENCY: 2x/week  PT DURATION: 8 weeks  PLANNED INTERVENTIONS: 97164- PT Re-evaluation, 97750- Physical Performance Testing, 97110-Therapeutic exercises, 97530- Therapeutic activity, [id number]- Neuromuscular re-education, 97535- Self Care, [id number]- Manual therapy, [phone]- Gait training, [phone]- Aquatic Therapy, [phone]- Electrical stimulation (unattended), Patient/Family education, Balance training, Stair training, Taping, DME instructions, Cryotherapy, and Moist heat  PLAN FOR NEXT SESSION: Check goals standing step ups, lateral and forward, hip abd . review home exercise program, should follow hip precautions until she sees MD 8/18.  manual therapy.   Delon  Kadeen Sroka, PT 01/07/24 2:19 PM Phone: [phone] Fax: [phone]

## 2024-01-08 ENCOUNTER — Telehealth (HOSPITAL_BASED_OUTPATIENT_CLINIC_OR_DEPARTMENT_OTHER): Payer: Self-pay | Admitting: Orthopaedic Surgery

## 2024-01-08 DIAGNOSIS — F411 Generalized anxiety disorder: Secondary | ICD-10-CM | POA: Diagnosis not present

## 2024-01-08 DIAGNOSIS — F332 Major depressive disorder, recurrent severe without psychotic features: Secondary | ICD-10-CM | POA: Diagnosis not present

## 2024-01-08 DIAGNOSIS — F4312 Post-traumatic stress disorder, chronic: Secondary | ICD-10-CM | POA: Diagnosis not present

## 2024-01-08 NOTE — Telephone Encounter (Signed)
 Patient would like for Dr B to call her she has questions. I ask her about her appointment on this week and was it a second opinion from her last surgery from her [facility] Dr she could not really give me an answer and I ask her if she had any imaging MRI x-rays etc. She said her Pt sent Dr B a message and asking him to call her. Patient has also seen Maryann  about 2 weeks ago. Best contact for patient [phone]

## 2024-01-09 ENCOUNTER — Telehealth (HOSPITAL_BASED_OUTPATIENT_CLINIC_OR_DEPARTMENT_OTHER): Payer: Self-pay | Admitting: Orthopaedic Surgery

## 2024-01-09 ENCOUNTER — Ambulatory Visit: Admitting: Physical Therapy

## 2024-01-09 ENCOUNTER — Encounter (HOSPITAL_BASED_OUTPATIENT_CLINIC_OR_DEPARTMENT_OTHER): Payer: Self-pay | Admitting: Orthopaedic Surgery

## 2024-01-09 ENCOUNTER — Encounter: Attending: Psychology | Admitting: Psychology

## 2024-01-09 DIAGNOSIS — M357 Hypermobility syndrome: Secondary | ICD-10-CM

## 2024-01-09 DIAGNOSIS — F419 Anxiety disorder, unspecified: Secondary | ICD-10-CM | POA: Insufficient documentation

## 2024-01-09 DIAGNOSIS — G43709 Chronic migraine without aura, not intractable, without status migrainosus: Secondary | ICD-10-CM | POA: Insufficient documentation

## 2024-01-09 DIAGNOSIS — F332 Major depressive disorder, recurrent severe without psychotic features: Secondary | ICD-10-CM | POA: Diagnosis not present

## 2024-01-09 DIAGNOSIS — Z96641 Presence of right artificial hip joint: Secondary | ICD-10-CM | POA: Diagnosis not present

## 2024-01-09 DIAGNOSIS — M25551 Pain in right hip: Secondary | ICD-10-CM | POA: Diagnosis not present

## 2024-01-09 DIAGNOSIS — F4312 Post-traumatic stress disorder, chronic: Secondary | ICD-10-CM | POA: Insufficient documentation

## 2024-01-09 DIAGNOSIS — M6281 Muscle weakness (generalized): Secondary | ICD-10-CM

## 2024-01-09 DIAGNOSIS — G4700 Insomnia, unspecified: Secondary | ICD-10-CM | POA: Diagnosis not present

## 2024-01-09 NOTE — Telephone Encounter (Signed)
 Called to to let her know her appointment if cx for tomorrow,Cancel reason CANNOT SEE POST OP UNLESS THERE IS A FORMAL TRANSFER OF CARE. Patient states that this in not a post of visit and wants this relayed. Patient also said that she would like a call from Dr. B because her PT sent him a message. I told the patient as of right now unless you get a A FORMAL TRANSFER OF CARE for your [facility] Physician this appointment is cx until I get further notice.

## 2024-01-09 NOTE — Therapy (Unsigned)
 OUTPATIENT PHYSICAL THERAPY LOWER EXTREMITY NOTE    Patient Name: Brianna [name] MRN: [id number] DOB:06/12/1995, 28 y.o., female Today's Date: 01/10/2024  END OF SESSION:  PT End of Session - 01/09/24 1508     Visit Number 14    Number of Visits 16    Date for PT Re-Evaluation 01/15/24    Authorization Type [employer]    PT Start Time 1505    PT Stop Time 1545    PT Time Calculation (min) 40 min    Activity Tolerance Patient tolerated treatment well    Behavior During Therapy [facility] for tasks assessed/performed                Past Medical History:  Diagnosis Date   Acute recurrent pansinusitis 10/21/2019   ADHD (attention deficit hyperactivity disorder)    inattentive   Allergy    Anemia    had iron infusions in Fall of 2023   Anxiety    Arthritis    Joints, hips   Asthma    EIA   Bronchitis    Cat allergy due to both airborne and skin contact 05/01/2016   Chronic sore throat 05/01/2016   COVID 2020   Depression    Ehlers-Danlos syndrome    Fibromyalgia    pt denies   Gastroparesis    GERD (gastroesophageal reflux disease)    History of hypotension    taken midodrine  in the past- Spring of 2022   Hypermobile Ehlers-Danlos syndrome    Irregular heart rate    Keratosis pilaris    Migraine    w and w/o auras   PCOS (polycystic ovarian syndrome) 10/26/2022   PONV (postoperative nausea and vomiting)    I can get really cold   PTSD (post-traumatic stress disorder)    Seasonal allergic rhinitis 03/11/2014   Past Surgical History:  Procedure Laterality Date   CHOLECYSTECTOMY  05/09/2021   ESOPHAGOGASTRODUODENOSCOPY ENDOSCOPY     03/2020   HIP ARTHROPLASTY Right    HIP ARTHROSCOPY Right 04/25/2022   Procedure: RIGHT HIP ARTHROSCOPY WITH LABRAL RECONSTRUCTION, ILIOTIBIAL BAND ALLOGRAFT, PSOAS RELEASE;  Surgeon: Genelle Standing, MD;  Location: [facility] OR;  Service: Orthopedics;  Laterality: Right;   HIP SURGERY Right    per pt   JOINT REPLACEMENT      Upcoming THR right hip 09/03/23   LABRAL REPAIR Right 03/02/2021   Procedure: RIGHT HIP ARTHROSCOPY WITH LABRAL REPAIR AND PINCE DEBRIDEMENT;  Surgeon: Genelle Standing, MD;  Location: [facility] OR;  Service: Orthopedics;  Laterality: Right;   SMART PILL PROCEDURE     12/2020   TYMPANOSTOMY TUBE PLACEMENT Bilateral    WISDOM TOOTH EXTRACTION     Patient Active Problem List   Diagnosis Date Noted   Chronic night sweats 08/21/2023   PCOS (polycystic ovarian syndrome) 10/26/2022   Cervical adenopathy 06/10/2021   Chronic pruritus 06/10/2021   Exercise-induced asthma 06/09/2021   Chronic venous insufficiency 06/03/2021   Orthostatic lightheadedness 06/03/2021   Purple toe syndrome of both feet (HCC) 06/03/2021   Autonomic dysfunction 06/03/2021   Tear of right acetabular labrum 04/13/2021   Migraine headache 03/01/2021   Headache 11/30/2020   Positive ANA (antinuclear antibody) 11/15/2020   Fibromyalgia 11/02/2020   Hair loss 11/02/2020   Muscle spasticity 10/22/2020   Hypermobile Ehlers-Danlos syndrome 10/22/2020   Paresthesia of bilateral legs 10/06/2020   Myalgia 10/06/2020   Chronic pelvic pain in female 10/06/2020   Chronic abdominal pain 10/06/2020   Irritable colon 08/02/2020   Pelvic floor dysfunction 08/02/2020  Irritant dermatitis 08/02/2020   Gastroesophageal reflux disease 06/28/2020   Severe recurrent major depression without psychotic features (HCC) 06/28/2020   Polyarthralgia 06/28/2020   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 06/28/2020   Tachycardia 06/28/2020   History of COVID-19 06/28/2020   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 deficit 06/28/2020   Recurrent major depressive disorder, in full remission (HCC) 06/28/2020   Atypical chest pain 06/22/2020   Dizziness 06/22/2020   Palpitations 06/14/2020   Anxiety 05/04/2020   Internal and external hemorrhoids without complication 04/29/2020   Nondiabetic gastroparesis 03/03/2020    ETD (Eustachian tube dysfunction), bilateral 10/21/2019   Acne vulgaris 03/11/2014   Mild intermittent asthma without complication 03/11/2014   Seasonal allergies 03/11/2014    PCP: Wendolyn Jenkins Jansky MD  REFERRING PROVIDER: Ezzard Redell Ezzard MD  REFERRING DIAG: [phone] (ICD-10-CM) - 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 Hip.  6/10-7/10.  Rt Groin. Does have some soreness in her lateral hip.     Patient presents physical therapy evaluation status post right anterior hip replacement.  She has a history of 2 labral repairs in the past but after multiple surgical consultations it was found that her hip dysplasia was causing osteoarthritis and significant joint degeneration.  This is a sensitive topic for her because she has been dealing with this problem for several years and wonders how no one found hip dysplasia on her x-ray.  She opted for total hip replacement and overall is pleased with her progress.  She had home health physical therapy for 2 weeks following discharge from hospital.  Overall she has been keeping up with her exercises.  She walks with 1 crutch without incident but cannot limp the only thing I have difficulty with is putting on my socks.  She does have some issues with pain in her proximal anterior thigh and hip.  She has some numbness and tingling around the site.  She is planning to move back into her home this week which is on the first level.  She has not seen the surgeon yet but has an  appointment in a few weeks.  She was unclear about any precautions other than what her home health therapist gave her.   PERTINENT HISTORY: Ehlers-Danlos syndrome, symptoms of dysautonomia including palpitations dizziness and heat intolerance  chronic joint and muscle pain PAIN:  Are you having pain? Yes: NPRS scale: 6 -7/10  Pain location: Rt ant hip  Pain description: throbbing, achy  Aggravating factors: standing, walking  Relieving factors: Changing positions but something that helps the most is an ice pack which she uses frequently  PRECAUTIONS: Anterior hip patient would be wise to follow due to collagen defect due to hypermobile Ehlers-Danlos syndrome  RED FLAGS: None   WEIGHT BEARING RESTRICTIONS: No  FALLS:  Has patient fallen in last 6 months? No  LIVING ENVIRONMENT: Lives with: lives alone Lives in: House/apartment Stairs: No Has following equipment at home: Crutches shower chair   OCCUPATION: [employer] out until 8/11 .   PLOF: Independent  PATIENT GOALS: I want to be walk pain free  NEXT MD VISIT:   OBJECTIVE:  Note: Objective measures were completed at Evaluation unless otherwise noted.  DIAGNOSTIC FINDINGS: see chart ([facility])   PATIENT SURVEYS:   COGNITION: Overall cognitive status: Within functional limits for tasks assessed     SENSATION: Patient reports numbness and tingling near incision site and along the lateral thigh  EDEMA:  Minimal surrounding incision Patient points out a apparent deformity on the right lateral hip just distal to the greater trochanter.  She reports that was present before her total hip replacement but is questioning what it is it did feel a bit puffy to me but it could also be gluteus atrophy which makes it look a little bit more pronounced  POSTURE: No Significant postural limitations  PALPATION: Tender to palpation along  well-healed incision  LOWER EXTREMITY MNF:[facility] hip ER/IR   Active ROM Right eval Left eval  Rt 01/07/24  Hip flexion P90 P 120 115 Pain  Hip extension     Hip abduction     Hip adduction     Hip internal rotation A24,P25 A35, P30 27 Pain   Hip external rotation A3, P10 A30, P50  47 Pain   Knee flexion     Knee extension     Ankle dorsiflexion     Ankle plantarflexion     Ankle inversion     Ankle eversion      (Blank rows = not tested)  LOWER EXTREMITY MMT:  MMT Right eval Left eval Rt  01/07/24  Hip flexion 4 5 NT  (3-/5?)  Hip extension   4/5  Hip abduction     Hip adduction   4/5  Hip internal rotation 4 4   Hip external rotation 3+ 4   Knee flexion 4+ 5   Knee extension 5 5   Ankle dorsiflexion     Ankle plantarflexion     Ankle inversion     Ankle eversion      (Blank rows = not tested)    FUNCTIONAL TESTS:  5 times sit to stand: 16 seconds 30 seconds chair stand test, 9 repetitions  GAIT: Distance walked: 100 feet Assistive device utilized: Crutches x1 Level of assistance: Complete Independence Comments: Patient without appreciable limp for the first 75 feet, slower more cautious pace                                                                                                                                TREATMENT DATE:   [facility] Adult PT Treatment:                                                DATE: 01/09/24 Therapeutic Exercise: Rt hip flexor stretch off edge of table IASTM to Rt ant thigh in prep for work  Stage manager with ball  x 10 (no pain)  Bridge with HS curl combo x 10  SLR leg on ball (Rt) x 10  Step up with opp leg lift 6 inch x 2 x 10  Sumo Squat 10 lbs x 10 , pain with full extension  Narrow parallel squat x 10 , 10 lbs  RDL 10 lbs  x 10   Self Care: Plan of care MRI vs msk US      [facility] Adult PT Treatment:                                                DATE: 01/07/24 Therapeutic Exercise: Recumbent bike, NuStep for 6 min  Rt LE off edge of table  Bridging x  10  Single leg bridge x 10 pain in Rt leg  when crossed on L  Hip abduction  Hip extension 2 x 10 knee ext and knee flexed x 10  Quad stretch with strap 1/2 kneeling ant hip stretch for Rt psoas  x 30 sec x 3  Self Care: Self release of iliopsoas   Psoas pin and stretch  US  , next visit with Genelle [facility] Adult PT Treatment:                                                DATE: 12/31/23 Therapeutic Exercise: Recumbent bike L2 for 6 min  Wall sit 30 sec x 3 toes, then heels holding ball  Supported squat x 10 then squat x 10 no UEs Ant hip stretch gentle Rt LE back  Step up to high knee 4 inch  Reverse step up for glute med Each side x 10  Glute med drops x 15 off 4 inch step  Supine hip flexion AAROM  Rt knee ext/flex with blue band added straight leg arc with blue band  Butterfly stretch gentle  LTR knees wide  Manual Therapy: LAD, Rt hip, thigh soft tissue work , IASTM along scar tissue, gentle pressure applied   [facility] Adult PT Treatment:                                                DATE: 12/26/23 Therapeutic Exercise: Supine quad, glute set SAQ 4 lbs x 15  Used towel for assisted hip flexion with small circles Rt LE bent knee fall out x 10 controlled  Hip rotations knees together and knees apart Bridging x 10  Manual Therapy: Supine hip flexor stretch 2 x 30 sec LAD grade I-II with oscillations IASTM thigh in progressively stretched position Prone quad stretch, PROM ER and IR  Self Care: Try TENS, add cold pack  Avoid over aggravating hip flexion for now.     [facility] Adult PT Treatment:                                                DATE: 12/24/23 MTPR along distal iliopsoas, TFL  STW along glute med Supine hip flexor stretch 2  x 30 sec LAD grade I-II with oscillations Heel slides 1 x 8 Supine marching 1 x 10 marching with heel raise progressed to 1 x 8 light hip flexor Bridge bil LE 2 x 10  Adjusted seating in vehicle to maximize function/ reduce stress.    PATIENT EDUCATION:  Education details: hip  precautions, POC, HEP  Person educated: Patient Education method: Explanation, Demonstration, Verbal cues, and Handouts Education comprehension: verbalized understanding, returned demonstration, and needs further education  HOME EXERCISE PROGRAM: Access Code: [email or web address] URL: [email or web address] Date: 11/20/2023 Prepared by: Delon Norma  Exercises - Supine Quad Set  - 1 x daily - 7 x weekly - 2 sets - 10 reps - 30 hold - Supine Bridge  - 1 x daily - 7 x weekly - 2 sets - 10 reps - 30 hold (SMALL RANGE OF MOTION)  - Sit to Stand  - 1 x daily - 7 x weekly - 2 sets - 10 reps - 5 hold - Standing Hip Abduction with Counter Support  - 1 x daily - 7 x weekly - 2 sets - 10 reps - 5 hold (TOE FORWARD)  - Standing Marching  - 1 x daily - 7 x weekly - 2 sets - 10 reps - 5 hold -hip flexor stretching  ASSESSMENT:  CLINICAL IMPRESSION:  Plan to hold PT until she completes MRI and or speaks with her surgeon.  She was able to do some light standing closed chain exercises today with min increase in Rt groin pain but mostly when hip goes into full extension.  She has plenty of exercises to do until then and also returns to work so that may be a light barrier.  She will be given a note in order to have time for PT during work hours, if necessary.  Will follow up with patient unless she reaches out prior.     OBJECTIVE IMPAIRMENTS: decreased activity tolerance, decreased endurance, decreased knowledge of use of DME, decreased mobility, difficulty walking, decreased ROM, decreased strength, increased edema, increased fascial restrictions, increased muscle spasms, impaired sensation, and pain.   ACTIVITY LIMITATIONS: carrying, lifting, bending, sitting, standing, squatting, sleeping, transfers, bed mobility, dressing, and locomotion level  PARTICIPATION LIMITATIONS: cleaning, driving, shopping, community activity, and occupation  PERSONAL FACTORS: Past/current experiences, Transportation, and  1-2 comorbidities: EDS< previous hip surgeries are also affecting patient's functional outcome.   REHAB POTENTIAL: Excellent  CLINICAL DECISION MAKING: Evolving/moderate complexity  EVALUATION COMPLEXITY: Moderate   GOALS: Goals reviewed with patient? Yes  SHORT TERM GOALS: Target date: 12/18/2023   Patient will be able to show independence for initial HEP to include posture, core and hip strength and stability.   Baseline: Goal status: Met  2.  Patient will be I with concepts of joint protection, hip precautions and stability as it pertains to joint hypermobility. Baseline:  Goal status: Met  3.  Patient will be able to ambulate in the clinic without the use of crutch and no discernible limp 300 feet  Baseline:  Goal status: MET   LONG TERM GOALS: Target date: 01/15/2024    LEFS score will increase by 16 points indicating improved functional mobility Baseline: 19/80 Goal status: INITIAL  2.  Patient will be independent with home exercise program upon discharge from physical therapy Baseline:  Goal status: INITIAL  3.  Patient will be able to walk without assistive device and pain no more than 2/10 In the right hip community distances > 1000 feet  Baseline:  Goal status: INITIAL  4.  Patient will demonstrate a pain free squat in order to complete transfers repetitively and obtain items below the waist for work and home tasks Baseline:  Goal status: INITIAL  5.  Patient will be able to demonstrate hip/knee/ankle strength to 5/5 in order to maximize functional mobility, ambulation and lifting.   Baseline:  Goal status: INITIAL  PLAN:  PT FREQUENCY: 2x/week  PT DURATION: 8 weeks  PLANNED INTERVENTIONS: 97164- PT Re-evaluation, 97750- Physical Performance Testing, 97110-Therapeutic exercises, 97530- Therapeutic activity, [id number]- Neuromuscular re-education, 97535- Self Care, [id number]- Manual therapy, [phone]- Gait training, [phone]- Aquatic Therapy, [phone]- Electrical  stimulation (unattended), Patient/Family education, Balance training, Stair training, Taping, DME instructions, Cryotherapy, and Moist heat  PLAN FOR NEXT SESSION: MRI results/MD recs.  Check goals standing step ups, lateral and forward, hip abd . review home exercise program. Delon Norma, PT 01/10/24 7:55 AM Phone: [phone] Fax: [phone]

## 2024-01-10 ENCOUNTER — Encounter (HOSPITAL_BASED_OUTPATIENT_CLINIC_OR_DEPARTMENT_OTHER): Payer: Self-pay

## 2024-01-10 ENCOUNTER — Ambulatory Visit (HOSPITAL_BASED_OUTPATIENT_CLINIC_OR_DEPARTMENT_OTHER): Admitting: Orthopaedic Surgery

## 2024-01-10 DIAGNOSIS — F332 Major depressive disorder, recurrent severe without psychotic features: Secondary | ICD-10-CM | POA: Diagnosis not present

## 2024-01-10 NOTE — Progress Notes (Signed)
 Neuropsychological Consultation   Patient:   Brianna [name]   DOB:   07-29-1995  MR Number:  [id number]  Location:  [facility] PHYSICAL MEDICINE AND REHABILITATION [address] [place], [address] [place] [address] Dept: [phone]           Date of Service:   01/09/2024  Location of Service and Individuals present: Today's visit was conducted in my outpatient clinic office with the patient myself present.  Start Time:   11 AM End Time:   2 PM  Patient Consent and Confidentiality: Limits of confidentiality including request for evaluation and consideration of diagnostic considerations to be made with formal report to be made available in the patient's EMR.  Patient was instructed that after review of my notes that she would have an opportunity to filter out some information if she found it not conducive to general medical records.  Consent for Evaluation and Treatment:  Signed:  Yes Explanation of Privacy Policies:  Signed:  Yes Discussion of Confidentiality Limits:  Yes  Provider/Observer:  Norleen Asa, Psy.D.       Clinical Neuropsychologist       Billing Code/Service: [id number]  Chief Complaint:     Chief Complaint  Patient presents with   Anxiety   Depression   Post-Traumatic Stress Disorder    01/10/2024 11 AM-12 PM: Today was a follow-up clinical interview to derive more information and discuss the types of neuropsychological evaluation that would be most appropriate to help facilitate differential diagnosis.  The patient continues to struggle with attentional issues but has significant PTSD, depression and anxiety symptoms.  These are longstanding features and it has had an impact on the patient's overall life functioning.  The patient has a number of medical issues that she is also dealing with including recent hip surgery that is not progressing well with significant pain and loss of range of motion.   Patient has current stressors including worried that she will be terminated from her current job because of delayed recovery time.  Patient has had a couple of other negative interactions and her recent medical interventions and interactions that have further her anxiety and stressors associated with medical status.  Difficulties getting an IV in and difficulty communicating with surgeon at [facility] regarding significant pain and lack of range of motion postsurgery or just some of the acute stressors.  We have now scheduled the patient for formal neuropsychological assessment and she will be administered the comprehensive attention battery and CAB CPT measures and this information will be combined with clinically relevant information to help facilitate diagnostic considerations and hopefully aid in treatment recommendations both for her treatment team including her therapist as well as her own individual efforts at improving her overall life functioning.  Once this is completed a formal neuropsychological evaluation will be derived and a feedback session is scheduled with the patient to go over those results.  Patient is scheduled for this testing to occur on 02/11/2024.  Currently the production of formal neuropsychological report is scheduled for 03/04/2024 and face-to-face feedback for this evaluation is scheduled 03/20/2024.  However, we will be working on trying to speed up those appointments as well.  I have included the reason for service and summary of the initial evaluation and clinical history below for convenience and it can be found in its entirety in the patient's EMR dated 08/21/2023.  Reason for Service:    Brianna [name] is a 28 year old female self-referred for  neuropsychological/psychological evaluation due to ongoing struggles including long history of anxiety and depression type symptoms, emotional dysregulation, attentional difficulties.  Patient has a long history of psychiatric treatment with  resistant depression and anxiety and significant history of psychological trauma with longstanding diagnosis of posttraumatic stress disorder.  Patient saw a therapist previously that suggested a longstanding personality disorder although given the patient's history and beginnings of this evaluation this previous consideration should be taken with caution.  Patient's traumatic history will not be gone into in great detail but it is significant and the patient has difficulty feeling emotionally safe and constantly monitors her environment for threats and dangers.  There was a great deal of parental discord with her parents divorcing when she was 9 and her early childhood included a locking from parental figures regarding her physical and emotional health.  Difficulty getting support from friends and family was a longstanding common theme in her life and continued concern around others reaction if she fully exposed or expressed her self to others regarding her anxiety, depression and social interaction stress.  During the clinical interview today, the patient reports that she could have written a book about her life struggles.  Depression and anxiety are usually at the top of her difficulties and she is struggled to manage the symptoms long-term.  Patient has had struggles maintaining healthy and fostering friendships as an adult and feels like a lot of her both long-term and short-term friends have ultimately ended up a banding in her.  The patient's vulnerability has led her to be engaged in due to various toxic relationships and her level of needs state leaves her vulnerability.  The patient is very concerned about others judging her and in fear that people will leave without trying to see or understand her true self.  Patient has a history of the development of suicidal ideation with presentation to emergency department in 2019 but voluntarily left rather than seek inpatient hospitalization.  The patient did  have inpatient hospitalization in 2020 for a 5 weeks duration.  The patient describes extreme, chronic feelings of helplessness and hopelessness and feeling like she is unworthy.  The patient describes recurrent suicidal ideation with no specific plan.  She describes times where she feels disconnected and dissociated from others with extreme periods of anxiety, sustained poor sleep and insomnia.  The patient also has physical symptoms of extreme night sweats but also has medical issues or diagnoses including polycystic ovarian syndrome.  Patient regularly has very vivid nightmares/negative dreams that are somewhat recurrent or disjointed but usually involved feelings of danger or threat or abandonment.  Patient has had a number of medical issues including GI difficulties, hypermobile elder's Danlos syndrome, diagnosis or considerations of fibromyalgia, autonomic dysfunction/dysregulation and chronic pain among others.  Patient has longstanding significant difficulties with hip and has had multiple surgeries and is continuing to be followed by [facility] for hip dysplasia.  Patient also was diagnosed in 2022 with chronic venous insufficiency and has been working with endocrinology around possible endocrine problems.  Patient has had a traumatic experience in a medical setting with a medical provider that leads to a great deal of apprehension around medical evaluations of a personal nature.  Full list of medical history and active medical issues can be found below.  Patient describes issues with attention and concentration which would be consistent with her level of anxiety and depression and chronic PTSD type symptoms.  The patient describes her memory as terrible and has difficulty with short-term  memory and recall of information.  Patient does not describe history of traumatic head injuries with loss of consciousness.  She does describe a loss of sense of smell over the past several years.  Early in  childhood, there was a lot of struggles in her parents relationship that ultimately ended in divorce when the patient was around 28 years of age.  The patient is struggled with her own mood state in childhood but her father did not believe in mental health treatment.  The patient's mother struggled with significant depression and had suicidal ideation and attempt after her parents divorced.   Impression/DX:   Brianna [name] is a 28 year old female self-referred for neuropsychological/psychological evaluation due to ongoing struggles including long history of anxiety and depression type symptoms, emotional dysregulation, attentional difficulties.  Patient has a long history of psychiatric treatment with resistant depression and anxiety and significant history of psychological trauma with longstanding diagnosis of posttraumatic stress disorder.  Patient saw a therapist previously that suggested a longstanding personality disorder although given the patient's history and beginnings of this evaluation this previous consideration should be taken with caution.  Patient's traumatic history will not be gone into in great detail but it is significant and the patient has difficulty feeling emotionally safe and constantly monitors her environment for threats and dangers.  There was a great deal of parental discord with her parents divorcing when she was 9 and her early childhood included a locking from parental figures regarding her physical and emotional health.  Difficulty getting support from friends and family was a longstanding common theme in her life and continued concern around others reaction if she fully exposed or expressed her self to others regarding her anxiety, depression and social interaction stress.  During the clinical interview today, the patient reports that she could have written a book about her life struggles.  Depression and anxiety are usually at the top of her difficulties and she is  struggled to manage the symptoms long-term.  Disposition/Plan:  The patient is scheduled to return in August and we will work on trying to get a sooner appointment if at all possible.  Further clinical review to help more solidify diagnostic considerations will continue with follow-up appointment.  The patient is struggling to try to better understand herself and find better avenues for self-care and improvement.  Patient has taken Viibryd in the past for attentional issues as concern for addressing these issues more fully have concern around complications for her anxiety and PTSD symptoms.  I suspect that much of her attentional issues are more attributed to her PTSD and anxiety versus an adult residual attention deficit disorder.  The patient does have a prescription for clonazepam for anxiety and distress.  The patient takes Ultram  for her chronic pain issues.  Diagnosis:    Chronic posttraumatic stress disorder  Anxiety  Chronic migraine without aura without status migrainosus, not intractable  Insomnia, unspecified type  Severe recurrent major depression without psychotic features [facility])        Note: This document was prepared using Dragon voice recognition software and may include unintentional dictation errors.   Electronically Signed   _______________________ Norleen Asa, Psy.D. Clinical Neuropsychologist

## 2024-01-12 DIAGNOSIS — M25551 Pain in right hip: Secondary | ICD-10-CM | POA: Diagnosis not present

## 2024-01-12 DIAGNOSIS — Z471 Aftercare following joint replacement surgery: Secondary | ICD-10-CM | POA: Diagnosis not present

## 2024-01-12 DIAGNOSIS — Z96641 Presence of right artificial hip joint: Secondary | ICD-10-CM | POA: Diagnosis not present

## 2024-01-15 ENCOUNTER — Encounter: Admitting: Psychology

## 2024-01-15 DIAGNOSIS — F4312 Post-traumatic stress disorder, chronic: Secondary | ICD-10-CM | POA: Diagnosis not present

## 2024-01-15 DIAGNOSIS — F332 Major depressive disorder, recurrent severe without psychotic features: Secondary | ICD-10-CM | POA: Diagnosis not present

## 2024-01-15 DIAGNOSIS — F411 Generalized anxiety disorder: Secondary | ICD-10-CM | POA: Diagnosis not present

## 2024-01-17 DIAGNOSIS — G43719 Chronic migraine without aura, intractable, without status migrainosus: Secondary | ICD-10-CM | POA: Diagnosis not present

## 2024-01-17 DIAGNOSIS — G4486 Cervicogenic headache: Secondary | ICD-10-CM | POA: Diagnosis not present

## 2024-01-18 DIAGNOSIS — Z96641 Presence of right artificial hip joint: Secondary | ICD-10-CM | POA: Diagnosis not present

## 2024-01-22 DIAGNOSIS — F332 Major depressive disorder, recurrent severe without psychotic features: Secondary | ICD-10-CM | POA: Diagnosis not present

## 2024-01-22 DIAGNOSIS — F411 Generalized anxiety disorder: Secondary | ICD-10-CM | POA: Diagnosis not present

## 2024-01-22 DIAGNOSIS — F4312 Post-traumatic stress disorder, chronic: Secondary | ICD-10-CM | POA: Diagnosis not present

## 2024-01-28 DIAGNOSIS — F332 Major depressive disorder, recurrent severe without psychotic features: Secondary | ICD-10-CM | POA: Diagnosis not present

## 2024-01-29 DIAGNOSIS — F332 Major depressive disorder, recurrent severe without psychotic features: Secondary | ICD-10-CM | POA: Diagnosis not present

## 2024-01-29 DIAGNOSIS — F4312 Post-traumatic stress disorder, chronic: Secondary | ICD-10-CM | POA: Diagnosis not present

## 2024-01-29 DIAGNOSIS — F411 Generalized anxiety disorder: Secondary | ICD-10-CM | POA: Diagnosis not present

## 2024-02-04 DIAGNOSIS — F332 Major depressive disorder, recurrent severe without psychotic features: Secondary | ICD-10-CM | POA: Diagnosis not present

## 2024-02-05 DIAGNOSIS — F411 Generalized anxiety disorder: Secondary | ICD-10-CM | POA: Diagnosis not present

## 2024-02-05 DIAGNOSIS — F4312 Post-traumatic stress disorder, chronic: Secondary | ICD-10-CM | POA: Diagnosis not present

## 2024-02-05 DIAGNOSIS — F332 Major depressive disorder, recurrent severe without psychotic features: Secondary | ICD-10-CM | POA: Diagnosis not present

## 2024-02-09 DIAGNOSIS — F332 Major depressive disorder, recurrent severe without psychotic features: Secondary | ICD-10-CM | POA: Diagnosis not present

## 2024-02-11 ENCOUNTER — Encounter

## 2024-02-11 DIAGNOSIS — F332 Major depressive disorder, recurrent severe without psychotic features: Secondary | ICD-10-CM | POA: Diagnosis not present

## 2024-02-18 ENCOUNTER — Encounter: Admitting: Family Medicine

## 2024-02-18 DIAGNOSIS — F332 Major depressive disorder, recurrent severe without psychotic features: Secondary | ICD-10-CM | POA: Diagnosis not present

## 2024-02-19 DIAGNOSIS — F332 Major depressive disorder, recurrent severe without psychotic features: Secondary | ICD-10-CM | POA: Diagnosis not present

## 2024-02-19 DIAGNOSIS — F411 Generalized anxiety disorder: Secondary | ICD-10-CM | POA: Diagnosis not present

## 2024-02-19 DIAGNOSIS — F4312 Post-traumatic stress disorder, chronic: Secondary | ICD-10-CM | POA: Diagnosis not present

## 2024-02-22 ENCOUNTER — Ambulatory Visit (INDEPENDENT_AMBULATORY_CARE_PROVIDER_SITE_OTHER): Admitting: Physical Therapy

## 2024-02-22 DIAGNOSIS — M6281 Muscle weakness (generalized): Secondary | ICD-10-CM | POA: Diagnosis not present

## 2024-02-22 DIAGNOSIS — Z96641 Presence of right artificial hip joint: Secondary | ICD-10-CM

## 2024-02-22 DIAGNOSIS — M25551 Pain in right hip: Secondary | ICD-10-CM | POA: Diagnosis not present

## 2024-02-22 DIAGNOSIS — M357 Hypermobility syndrome: Secondary | ICD-10-CM

## 2024-02-22 NOTE — Addendum Note (Signed)
 Addended by: JOHNSTON NEST L on: 02/22/2024 12:03 PM   Modules accepted: Orders

## 2024-02-22 NOTE — Therapy (Signed)
 OUTPATIENT PHYSICAL THERAPY LOWER EXTREMITY RE-EVALUATION   Patient Name: Brianna [name] MRN: [id number] DOB:1996/04/18, 28 y.o., female Today's Date: 02/22/2024  END OF SESSION:  PT End of Session - 02/22/24 1200     Visit Number 15    Number of Visits 23    Date for Recertification  04/18/24    Authorization Type [employer]    PT Start Time 1104    PT Stop Time 1147    PT Time Calculation (min) 43 min    Activity Tolerance Patient tolerated treatment well    Behavior During Therapy [facility] for tasks assessed/performed                 Past Medical History:  Diagnosis Date   Acute recurrent pansinusitis 10/21/2019   ADHD (attention deficit hyperactivity disorder)    inattentive   Allergy    Anemia    had iron infusions in Fall of 2023   Anxiety    Arthritis    Joints, hips   Asthma    EIA   Bronchitis    Cat allergy due to both airborne and skin contact 05/01/2016   Chronic sore throat 05/01/2016   COVID 2020   Depression    Ehlers-Danlos syndrome    Fibromyalgia    pt denies   Gastroparesis    GERD (gastroesophageal reflux disease)    History of hypotension    taken midodrine  in the past- Spring of 2022   Hypermobile Ehlers-Danlos syndrome    Irregular heart rate    Keratosis pilaris    Migraine    w and w/o auras   PCOS (polycystic ovarian syndrome) 10/26/2022   PONV (postoperative nausea and vomiting)    I can get really cold   PTSD (post-traumatic stress disorder)    Seasonal allergic rhinitis 03/11/2014   Past Surgical History:  Procedure Laterality Date   CHOLECYSTECTOMY  05/09/2021   ESOPHAGOGASTRODUODENOSCOPY ENDOSCOPY     03/2020   HIP ARTHROPLASTY Right    HIP ARTHROSCOPY Right 04/25/2022   Procedure: RIGHT HIP ARTHROSCOPY WITH LABRAL RECONSTRUCTION, ILIOTIBIAL BAND ALLOGRAFT, PSOAS RELEASE;  Surgeon: Genelle Standing, MD;  Location: [facility] OR;  Service: Orthopedics;  Laterality: Right;   HIP SURGERY Right    per pt   JOINT REPLACEMENT      Upcoming THR right hip 09/03/23   LABRAL REPAIR Right 03/02/2021   Procedure: RIGHT HIP ARTHROSCOPY WITH LABRAL REPAIR AND PINCE DEBRIDEMENT;  Surgeon: Genelle Standing, MD;  Location: [facility] OR;  Service: Orthopedics;  Laterality: Right;   SMART PILL PROCEDURE     12/2020   TYMPANOSTOMY TUBE PLACEMENT Bilateral    WISDOM TOOTH EXTRACTION     Patient Active Problem List   Diagnosis Date Noted   Chronic night sweats 08/21/2023   PCOS (polycystic ovarian syndrome) 10/26/2022   Cervical adenopathy 06/10/2021   Chronic pruritus 06/10/2021   Exercise-induced asthma 06/09/2021   Chronic venous insufficiency 06/03/2021   Orthostatic lightheadedness 06/03/2021   Purple toe syndrome of both feet (HCC) 06/03/2021   Autonomic dysfunction 06/03/2021   Tear of right acetabular labrum 04/13/2021   Migraine headache 03/01/2021   Headache 11/30/2020   Positive ANA (antinuclear antibody) 11/15/2020   Fibromyalgia 11/02/2020   Hair loss 11/02/2020   Muscle spasticity 10/22/2020   Hypermobile Ehlers-Danlos syndrome 10/22/2020   Paresthesia of bilateral legs 10/06/2020   Myalgia 10/06/2020   Chronic pelvic pain in female 10/06/2020   Chronic abdominal pain 10/06/2020   Irritable colon 08/02/2020   Pelvic floor dysfunction 08/02/2020  Irritant dermatitis 08/02/2020   Gastroesophageal reflux disease 06/28/2020   Severe recurrent major depression without psychotic features (HCC) 06/28/2020   Polyarthralgia 06/28/2020   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 06/28/2020   Tachycardia 06/28/2020   History of COVID-19 06/28/2020   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 deficit 06/28/2020   Recurrent major depressive disorder, in full remission 06/28/2020   Atypical chest pain 06/22/2020   Dizziness 06/22/2020   Palpitations 06/14/2020   Anxiety 05/04/2020   Internal and external hemorrhoids without complication 04/29/2020   Nondiabetic gastroparesis 03/03/2020   ETD  (Eustachian tube dysfunction), bilateral 10/21/2019   Acne vulgaris 03/11/2014   Mild intermittent asthma without complication 03/11/2014   Seasonal allergies 03/11/2014    PCP: Wendolyn Jenkins Jansky MD  REFERRING PROVIDER: Ezzard Redell Ezzard MD  REFERRING DIAG: [phone] (ICD-10-CM) - 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  With the job, feels like she will not be able to heal.  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   Rt groin and prox thigh. Pain and always tight, sore. Pain is constant.  Thigh is numb and tingling.  Limping, Hard to be on her feet. Job entails pushing, pullling wheelchair, heavy patients. Walking, sit to stand. Pain levels range from 3/10 to a 7/10.   Heat and ice as much as much as I can. Leg feels weak with lifting (car transfers, bed transfers, ADLs) Patient reports tightness with crossing her leg.  MRI was fine.      Patient presents physical therapy evaluation status post right anterior hip replacement.  She has a history of 2 labral repairs in the past but after multiple surgical consultations it was found that her hip dysplasia was causing osteoarthritis and significant joint degeneration.  This is a sensitive topic for her because she has been dealing with this problem for several years and wonders how no one found hip dysplasia on her x-ray.  She opted for total hip replacement and overall is pleased with her progress.  She had home health physical therapy for 2 weeks following  discharge from hospital.  Overall she has been keeping up with her exercises.  She walks with 1 crutch without incident but cannot limp the only thing I have difficulty with is putting on my socks.  She does have some issues with pain in her proximal anterior thigh and hip.  She has some numbness and tingling around the site.  She is planning to move back into her home this week which is on the first level.  She has not seen the surgeon yet but has an appointment in a few weeks.  She was unclear about any precautions other than what her home health therapist gave her.   PERTINENT HISTORY: Ehlers-Danlos syndrome, symptoms of dysautonomia including palpitations dizziness and heat intolerance  chronic joint and muscle pain  MD note:  Postoperative hip pain - Persistent left hip pain since total hip arthroplasty performed three months ago - Pain rated at 6 out of 10 in intensity - New onset of different pain and popping sensation in  the hip while getting into her car - Pain was most intense during the first three days after the incident and has since remained consistent - Pain flares with activities such as walking, driving, and physical therapy - Pain is aggravated by movements that activate the hip flexor - Unable to perform hip flexor activities without significant discomfort - Recent return to work has increased walking and subsequently worsened pain - Concern for developing gait abnormalities due to pain and hip weakness  Pain management and medication intolerance - Uses ice for pain management - Unable to tolerate NSAIDs due to history of adverse effects and gastroparesis - Not taking any medication for pain    PAIN:  Are you having pain? Yes: NPRS scale: 5 /10  Pain location: Rt antlat. hip  Pain description: throbbing, achy  Aggravating factors: standing, walking  Relieving factors: Changing positions but something that helps the most is an ice pack which she uses  frequently  PRECAUTIONS: Anterior hip patient would be wise to follow due to collagen defect due to hypermobile Ehlers-Danlos syndrome  RED FLAGS: None   WEIGHT BEARING RESTRICTIONS: No  FALLS:  Has patient fallen in last 6 months? No  LIVING ENVIRONMENT: Lives with: lives alone Lives in: House/apartment Stairs: No Has following equipment at home: Crutches shower chair   OCCUPATION: [employer] out until 8/11 .   PLOF: Independent  PATIENT GOALS: I want to be walk pain free   NEXT MD VISIT:   OBJECTIVE:  Note: Objective measures were completed at Evaluation unless otherwise noted.  DIAGNOSTIC FINDINGS: see chart ([facility])   PATIENT SURVEYS:   COGNITION: Overall cognitive status: Within functional limits for tasks assessed     SENSATION: Patient reports numbness and tingling near incision site and along the lateral thigh  EDEMA:  Minimal surrounding incision Patient points out a apparent deformity on the right lateral hip just distal to the greater trochanter.  She reports that was present before her total hip replacement but is questioning what it is it did feel a bit puffy to me but it could also be gluteus atrophy which makes it look a little bit more pronounced  POSTURE: No Significant postural limitations  PALPATION: Tender to palpation along  well-healed incision  LOWER EXTREMITY MNF:[facility] hip ER/IR   Active ROM Right eval Left eval Rt 01/07/24 Rt. 02/22/24  Hip flexion P90 P 120 115 Pain 120 pinch   Hip extension      Hip abduction      Hip adduction      Hip internal rotation A24,P25 A35, P30 27 Pain  20  Hip external rotation A3, P10 A30, P50  47 Pain  45  Knee flexion      Knee extension      Ankle dorsiflexion      Ankle plantarflexion      Ankle inversion      Ankle eversion       (Blank rows = not tested)  LOWER EXTREMITY MMT: 02/22/24: LLE 5/5  MMT Right eval Left eval Rt  01/07/24 Rt. 02/22/24  Hip flexion 4 5 NT  (3-/5?) 3-/5   Hip  extension   4/5 5/5  Hip abduction      Hip adduction   4/5   Hip internal rotation 4 4  4+  Hip external rotation 3+ 4  4  Knee flexion 4+ 5  5  Knee extension 5 5  5   Ankle dorsiflexion    5  Ankle plantarflexion  Ankle inversion      Ankle eversion       (Blank rows = not tested)    FUNCTIONAL TESTS:  5 times sit to stand: 16 seconds 30 seconds chair stand test, 9 repetitions  GAIT: Distance walked: 100 feet Assistive device utilized: Crutches x1 Level of assistance: Complete Independence Comments: Patient without appreciable limp for the first 75 feet, slower more cautious pace                                                                                                                                TREATMENT DATE:    [facility] Adult PT Treatment:                                                DATE: 02/22/24 Re-Eval  2 min walk 417 feet , pain increased to 6.5/10  Hip flexor Rt LE off table Review HEP, POC and dry needling options , safety.   [facility] Adult PT Treatment:                                                DATE: 01/09/24 Therapeutic Exercise: Rt hip flexor stretch off edge of table IASTM to Rt ant thigh in prep for work  Stage manager with ball  x 10 (no pain)  Bridge with HS curl  combo x 10  SLR leg on ball (Rt) x 10  Step up with opp leg lift 6 inch x 2 x 10  Sumo Squat 10 lbs x 10 , pain with full extension  Narrow parallel squat x 10 , 10 lbs  RDL 10 lbs  x 10   Self Care: Plan of care MRI vs msk US            PATIENT EDUCATION:  Education details: hip precautions, POC, HEP  Person educated: Patient Education method: Programmer, multimedia, Demonstration, Verbal cues, and Handouts Education comprehension: verbalized understanding, returned demonstration, and needs further education  HOME EXERCISE PROGRAM: Access Code: [email or web address] URL: [email or web address] Date: 11/20/2023 Prepared by: Delon Norma  Exercises - Supine  Quad Set  - 1 x daily - 7 x weekly - 2 sets - 10 reps - 30 hold - Supine Bridge  - 1 x daily - 7 x weekly - 2 sets - 10 reps - 30 hold (SMALL RANGE OF MOTION)  - Sit to Stand  - 1 x daily - 7 x weekly - 2 sets - 10 reps - 5 hold - Standing Hip Abduction with Counter Support  - 1 x daily - 7 x weekly - 2 sets - 10 reps - 5 hold (TOE FORWARD)  - Standing Marching  -  1 x daily - 7 x weekly - 2 sets - 10 reps - 5 hold -hip flexor stretching    ASSESSMENT:  CLINICAL IMPRESSION: Patient returns after about 6 weeks away from physical therapy.  She did see the surgeon see above for notes.  The MRI was normal.  Her pain is persistent and interferes with her ability to stand, walk and work.  She does show improved strength in the lateral hip but continued groin pain and anterior thigh pain, weakness with straight leg raise and transfers.  She will continue to benefit from skilled PT to see if more optimal functioning can occur.    Plan to hold PT until she completes MRI and or speaks with her surgeon.  She was able to do some light standing closed chain exercises today with min increase in Rt groin pain but mostly when hip goes into full extension.  She has plenty of exercises to do until then and also returns to work so that may be a light barrier.  She will be given a note in order to have time for PT during work hours, if necessary.  Will follow up with patient unless she reaches out prior.     OBJECTIVE IMPAIRMENTS: decreased activity tolerance, decreased endurance, decreased knowledge of use of DME, decreased mobility, difficulty walking, decreased ROM, decreased strength, increased edema, increased fascial restrictions, increased muscle spasms, impaired sensation, and pain.   ACTIVITY LIMITATIONS: carrying, lifting, bending, sitting, standing, squatting, sleeping, transfers, bed mobility, dressing, and locomotion level  PARTICIPATION LIMITATIONS: cleaning, driving, shopping, community activity, and  occupation  PERSONAL FACTORS: Past/current experiences, Transportation, and 1-2 comorbidities: EDS< previous hip surgeries are also affecting patient's functional outcome.   REHAB POTENTIAL: Excellent  CLINICAL DECISION MAKING: Evolving/moderate complexity  EVALUATION COMPLEXITY: Moderate   GOALS: Goals reviewed with patient? Yes  SHORT TERM GOALS: Target date: 12/18/2023   Patient will be able to show independence for initial HEP to include posture, core and hip strength and stability.   Baseline: Goal status: MET   2.  Patient will be I with concepts of joint protection, hip precautions and stability as it pertains to joint hypermobility. Baseline:  Goal status:MET   3.  Patient will be able to ambulate in the clinic without the use of crutch and no discernible limp 300 feet  Baseline:  Goal status: MET   LONG TERM GOALS: Target date: 01/15/2024    LEFS score will increase by 16 points indicating improved functional mobility Baseline: 19/80, 02/22/24: 30/80 Goal status: ongoing   2.  Patient will be independent with home exercise program upon discharge from physical therapy Baseline:  Goal status:ongoing   3.  Patient will be able to walk without assistive device and pain no more than 2/10 In the right hip community distances > 1000 feet  Baseline:  Goal status: ongoing  4.  Patient will demonstrate a pain free squat in order to complete transfers repetitively and obtain items below the waist for work and home tasks Baseline:  Goal status: ongoing   5.  Patient will be able to demonstrate hip/knee/ankle strength to 5/5 in order to maximize functional mobility, ambulation and lifting.   Baseline:  Goal status: ongoing    6. Patient will be able to show dynamic balance recovery in single leg exercises    Baseline:   Goal status: NEW  PLAN:  PT FREQUENCY: 2x/week  PT DURATION: 8 weeks  PLANNED INTERVENTIONS: 97164- PT Re-evaluation, 97750- Physical Performance  Testing, 97110-Therapeutic exercises,  02469- Therapeutic activity, [id number]- Neuromuscular re-education, [id number]- Self Care, [id number]- Manual therapy, [id number]- Gait training, [phone]- Aquatic Therapy, [phone]- Electrical stimulation (unattended), Patient/Family education, Balance training, Stair training, Taping, DME instructions, Cryotherapy, and Moist heat  PLAN FOR NEXT SESSION: Focus on closed chain exercises consider trigger point dry needling to the right TFL and glutes in order to reduce symptoms.manual . Pilates?  Delon Norma, PT 02/22/24 12:01 PM Phone: [phone] Fax: [phone]

## 2024-02-23 DIAGNOSIS — F332 Major depressive disorder, recurrent severe without psychotic features: Secondary | ICD-10-CM | POA: Diagnosis not present

## 2024-02-25 DIAGNOSIS — F332 Major depressive disorder, recurrent severe without psychotic features: Secondary | ICD-10-CM | POA: Diagnosis not present

## 2024-02-26 DIAGNOSIS — F4312 Post-traumatic stress disorder, chronic: Secondary | ICD-10-CM | POA: Diagnosis not present

## 2024-02-26 DIAGNOSIS — F332 Major depressive disorder, recurrent severe without psychotic features: Secondary | ICD-10-CM | POA: Diagnosis not present

## 2024-02-28 ENCOUNTER — Ambulatory Visit

## 2024-03-03 ENCOUNTER — Ambulatory Visit (INDEPENDENT_AMBULATORY_CARE_PROVIDER_SITE_OTHER): Admitting: Physical Therapy

## 2024-03-03 ENCOUNTER — Encounter: Payer: Self-pay | Admitting: Physical Therapy

## 2024-03-03 DIAGNOSIS — M357 Hypermobility syndrome: Secondary | ICD-10-CM

## 2024-03-03 DIAGNOSIS — M6281 Muscle weakness (generalized): Secondary | ICD-10-CM | POA: Diagnosis not present

## 2024-03-03 DIAGNOSIS — M25551 Pain in right hip: Secondary | ICD-10-CM

## 2024-03-03 DIAGNOSIS — F332 Major depressive disorder, recurrent severe without psychotic features: Secondary | ICD-10-CM | POA: Diagnosis not present

## 2024-03-03 NOTE — Therapy (Unsigned)
 OUTPATIENT PHYSICAL THERAPY LOWER EXTREMITY TREATMENT    Patient Name: Brianna [name] MRN: [id number] DOB:1995-09-07, 28 y.o., female Today's Date: 03/03/2024  END OF SESSION:  PT End of Session - 03/03/24 1758     Visit Number 16    Number of Visits 23    Date for Recertification  04/18/24    Authorization Type [employer]    PT Start Time 1605    PT Stop Time 1650    PT Time Calculation (min) 45 min    Activity Tolerance Patient tolerated treatment well    Behavior During Therapy [facility] for tasks assessed/performed                  Past Medical History:  Diagnosis Date   Acute recurrent pansinusitis 10/21/2019   ADHD (attention deficit hyperactivity disorder)    inattentive   Allergy    Anemia    had iron infusions in Fall of 2023   Anxiety    Arthritis    Joints, hips   Asthma    EIA   Bronchitis    Cat allergy due to both airborne and skin contact 05/01/2016   Chronic sore throat 05/01/2016   COVID 2020   Depression    Ehlers-Danlos syndrome    Fibromyalgia    pt denies   Gastroparesis    GERD (gastroesophageal reflux disease)    History of hypotension    taken midodrine  in the past- Spring of 2022   Hypermobile Ehlers-Danlos syndrome    Irregular heart rate    Keratosis pilaris    Migraine    w and w/o auras   PCOS (polycystic ovarian syndrome) 10/26/2022   PONV (postoperative nausea and vomiting)    I can get really cold   PTSD (post-traumatic stress disorder)    Seasonal allergic rhinitis 03/11/2014   Past Surgical History:  Procedure Laterality Date   CHOLECYSTECTOMY  05/09/2021   ESOPHAGOGASTRODUODENOSCOPY ENDOSCOPY     03/2020   HIP ARTHROPLASTY Right    HIP ARTHROSCOPY Right 04/25/2022   Procedure: RIGHT HIP ARTHROSCOPY WITH LABRAL RECONSTRUCTION, ILIOTIBIAL BAND ALLOGRAFT, PSOAS RELEASE;  Surgeon: Genelle Standing, MD;  Location: [facility] OR;  Service: Orthopedics;  Laterality: Right;   HIP SURGERY Right    per pt   JOINT REPLACEMENT      Upcoming THR right hip 09/03/23   LABRAL REPAIR Right 03/02/2021   Procedure: RIGHT HIP ARTHROSCOPY WITH LABRAL REPAIR AND PINCE DEBRIDEMENT;  Surgeon: Genelle Standing, MD;  Location: [facility] OR;  Service: Orthopedics;  Laterality: Right;   SMART PILL PROCEDURE     12/2020   TYMPANOSTOMY TUBE PLACEMENT Bilateral    WISDOM TOOTH EXTRACTION     Patient Active Problem List   Diagnosis Date Noted   Chronic night sweats 08/21/2023   PCOS (polycystic ovarian syndrome) 10/26/2022   Cervical adenopathy 06/10/2021   Chronic pruritus 06/10/2021   Exercise-induced asthma 06/09/2021   Chronic venous insufficiency 06/03/2021   Orthostatic lightheadedness 06/03/2021   Purple toe syndrome of both feet (HCC) 06/03/2021   Autonomic dysfunction 06/03/2021   Tear of right acetabular labrum 04/13/2021   Migraine headache 03/01/2021   Headache 11/30/2020   Positive ANA (antinuclear antibody) 11/15/2020   Fibromyalgia 11/02/2020   Hair loss 11/02/2020   Muscle spasticity 10/22/2020   Hypermobile Ehlers-Danlos syndrome 10/22/2020   Paresthesia of bilateral legs 10/06/2020   Myalgia 10/06/2020   Chronic pelvic pain in female 10/06/2020   Chronic abdominal pain 10/06/2020   Irritable colon 08/02/2020   Pelvic floor  dysfunction 08/02/2020   Irritant dermatitis 08/02/2020   Gastroesophageal reflux disease 06/28/2020   Severe recurrent major depression without psychotic features (HCC) 06/28/2020   Polyarthralgia 06/28/2020   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 06/28/2020   Tachycardia 06/28/2020   History of COVID-19 06/28/2020   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 deficit 06/28/2020   Recurrent major depressive disorder, in full remission 06/28/2020   Atypical chest pain 06/22/2020   Dizziness 06/22/2020   Palpitations 06/14/2020   Anxiety 05/04/2020   Internal and external hemorrhoids without complication 04/29/2020   Nondiabetic gastroparesis 03/03/2020   ETD  (Eustachian tube dysfunction), bilateral 10/21/2019   Acne vulgaris 03/11/2014   Mild intermittent asthma without complication 03/11/2014   Seasonal allergies 03/11/2014    PCP: Wendolyn Jenkins Jansky MD  REFERRING PROVIDER: Ezzard Redell Ezzard MD  REFERRING DIAG: [phone] (ICD-10-CM) - 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 Most pain in anterior hip and thigh.   Rt groin and prox thigh. Pain and always tight, sore. Pain is constant.  Thigh is numb and tingling.  Limping, Hard to be on her feet. Job entails pushing, pullling wheelchair, heavy patients. Walking, sit to stand. Pain levels range from 3/10 to a 7/10.   Heat and ice as much as much as I can. Leg feels weak with lifting (car transfers, bed transfers, ADLs) Patient reports tightness with crossing her leg.  MRI was fine.      Patient presents physical therapy evaluation status post right anterior hip replacement.  She has a history of 2 labral repairs in the past but after multiple surgical consultations it was found that her hip dysplasia was causing osteoarthritis and significant joint degeneration.  This is a sensitive topic for her because she has been dealing with this problem for several years and wonders how no one found hip dysplasia on her x-ray.  She opted for total hip replacement and overall is pleased with her progress.  She had home health physical therapy for 2 weeks following discharge from hospital.  Overall she has been keeping up with her exercises.  She walks with 1  crutch without incident but cannot limp the only thing I have difficulty with is putting on my socks.  She does have some issues with pain in her proximal anterior thigh and hip.  She has some numbness and tingling around the site.  She is planning to move back into her home this week which is on the first level.  She has not seen the surgeon yet but has an appointment in a few weeks.  She was unclear about any precautions other than what her home health therapist gave her.   PERTINENT HISTORY: Ehlers-Danlos syndrome, symptoms of dysautonomia including palpitations dizziness and heat intolerance  chronic joint and muscle pain  MD note:  Postoperative hip pain - Persistent left hip pain since total hip arthroplasty performed three months ago - Pain rated at 6 out of 10 in intensity - New onset of different pain and popping sensation in the hip while getting into her car - Pain was most intense during the first three days  after the incident and has since remained consistent - Pain flares with activities such as walking, driving, and physical therapy - Pain is aggravated by movements that activate the hip flexor - Unable to perform hip flexor activities without significant discomfort - Recent return to work has increased walking and subsequently worsened pain - Concern for developing gait abnormalities due to pain and hip weakness  Pain management and medication intolerance - Uses ice for pain management - Unable to tolerate NSAIDs due to history of adverse effects and gastroparesis - Not taking any medication for pain    PAIN:  Are you having pain? Yes: NPRS scale: 5 /10  Pain location: Rt antlat. hip  Pain description: throbbing, achy  Aggravating factors: standing, walking  Relieving factors: Changing positions but something that helps the most is an ice pack which she uses frequently  PRECAUTIONS: Anterior hip patient would be wise to follow due to collagen defect due to hypermobile  Ehlers-Danlos syndrome  RED FLAGS: None   WEIGHT BEARING RESTRICTIONS: No  FALLS:  Has patient fallen in last 6 months? No  LIVING ENVIRONMENT: Lives with: lives alone Lives in: House/apartment Stairs: No Has following equipment at home: Crutches shower chair   OCCUPATION: [employer] out until 8/11 .   PLOF: Independent  PATIENT GOALS: I want to be walk pain free   NEXT MD VISIT:   OBJECTIVE:  Note: Objective measures were completed at Evaluation unless otherwise noted.  DIAGNOSTIC FINDINGS: see chart ([facility])   PATIENT SURVEYS:   COGNITION: Overall cognitive status: Within functional limits for tasks assessed     SENSATION: Patient reports numbness and tingling near incision site and along the lateral thigh  EDEMA:  Minimal surrounding incision Patient points out a apparent deformity on the right lateral hip just distal to the greater trochanter.  She reports that was present before her total hip replacement but is questioning what it is it did feel a bit puffy to me but it could also be gluteus atrophy which makes it look a little bit more pronounced  POSTURE: No Significant postural limitations  PALPATION: Tender to palpation along  well-healed incision  LOWER EXTREMITY MNF:[facility] hip ER/IR   Active ROM Right eval Left eval Rt 01/07/24 Rt. 02/22/24  Hip flexion P90 P 120 115 Pain 120 pinch   Hip extension      Hip abduction      Hip adduction      Hip internal rotation A24,P25 A35, P30 27 Pain  20  Hip external rotation A3, P10 A30, P50  47 Pain  45  Knee flexion      Knee extension      Ankle dorsiflexion      Ankle plantarflexion      Ankle inversion      Ankle eversion       (Blank rows = not tested)  LOWER EXTREMITY MMT: 02/22/24: LLE 5/5  MMT Right eval Left eval Rt  01/07/24 Rt. 02/22/24  Hip flexion 4 5 NT  (3-/5?) 3-/5   Hip extension   4/5 5/5  Hip abduction      Hip adduction   4/5   Hip internal rotation 4 4  4+  Hip external  rotation 3+ 4  4  Knee flexion 4+ 5  5  Knee extension 5 5  5   Ankle dorsiflexion    5  Ankle plantarflexion      Ankle inversion      Ankle eversion       (  Blank rows = not tested)    FUNCTIONAL TESTS:  5 times sit to stand: 16 seconds 30 seconds chair stand test, 9 repetitions  GAIT: Distance walked: 100 feet Assistive device utilized: Crutches x1 Level of assistance: Complete Independence Comments: Patient without appreciable limp for the first 75 feet, slower more cautious pace                                                                                                                                TREATMENT DATE:    03/03/24: Therapeutic Exercise: Aerobic: Supine: bridging 2 x 10;  Seated: S/L:  hip abd 2 x 10 on R;  Standing:  Band walks GTB x2 (mild dizziness)  Stretches:  Neuromuscular Re-education: Manual Therapy: IASTM to anterior hip musculature, tfl , rec fem and vastus lateralis  Therapeutic Activity:   Step ups with opp knee drive,  no UE support 2 x 10 bil;  Lateral step ups x 10 bil;  Self Care: Trigger Point Dry Needling  Initial Treatment: Pt instructed on Dry Needling rational, procedures, and possible side effects. Pt instructed to expect mild to moderate muscle soreness later in the day and/or into the next day.  Pt instructed in methods to reduce muscle soreness. Pt instructed to continue prescribed HEP. Patient was educated on signs and symptoms of infection and other risk factors and advised to seek medical attention should they occur.  Patient verbalized understanding of these instructions and education.   Patient Verbal Consent Given: Yes Education Handout Provided: Yes Muscles Treated: R TFL, R vastus lateralis, R piriformis Electrical Stimulation Performed: No Treatment Response/Outcome: palpable increase in muscle length     [facility] Adult PT Treatment:                                                DATE: 02/22/24 Re-Eval  2 min  walk 417 feet , pain increased to 6.5/10  Hip flexor Rt LE off table Review HEP, POC and dry needling options , safety.    [facility] Adult PT Treatment:                                                DATE: 01/09/24 Therapeutic Exercise: Rt hip flexor stretch off edge of table IASTM to Rt ant thigh in prep for work  Stage manager with ball  x 10 (no pain)  Bridge with HS curl  combo x 10  SLR leg on ball (Rt) x 10  Step up with opp leg lift 6 inch x 2 x 10  Sumo Squat 10 lbs x 10 , pain with full extension  Narrow parallel squat x 10 , 10  lbs  RDL 10 lbs  x 10   Self Care: Plan of care MRI vs msk US       PATIENT EDUCATION:  Education details: hip precautions, POC, HEP  Person educated: Patient Education method: Programmer, multimedia, Demonstration, Verbal cues, and Handouts Education comprehension: verbalized understanding, returned demonstration, and needs further education  HOME EXERCISE PROGRAM: Access Code: [email or web address] URL: [email or web address] Date: 11/20/2023 Prepared by: Delon Norma  Exercises - Supine Quad Set  - 1 x daily - 7 x weekly - 2 sets - 10 reps - 30 hold - Supine Bridge  - 1 x daily - 7 x weekly - 2 sets - 10 reps - 30 hold (SMALL RANGE OF MOTION)  - Sit to Stand  - 1 x daily - 7 x weekly - 2 sets - 10 reps - 5 hold - Standing Hip Abduction with Counter Support  - 1 x daily - 7 x weekly - 2 sets - 10 reps - 5 hold (TOE FORWARD)  - Standing Marching  - 1 x daily - 7 x weekly - 2 sets - 10 reps - 5 hold -hip flexor stretching    ASSESSMENT:  CLINICAL IMPRESSION: Patient with good tolerance for strengthening. She does have muscle fatigue, but only mild soreness. She will benefit from continued strengthening for lateral and posterior hip as tolerated. Dry needling done today for anterior hip as well as glute. Will assess effects of pain relief next visit.     Plan to hold PT until she completes MRI and or speaks with her surgeon.  She was  able to do some light standing closed chain exercises today with min increase in Rt groin pain but mostly when hip goes into full extension.  She has plenty of exercises to do until then and also returns to work so that may be a light barrier.  She will be given a note in order to have time for PT during work hours, if necessary.  Will follow up with patient unless she reaches out prior.     OBJECTIVE IMPAIRMENTS: decreased activity tolerance, decreased endurance, decreased knowledge of use of DME, decreased mobility, difficulty walking, decreased ROM, decreased strength, increased edema, increased fascial restrictions, increased muscle spasms, impaired sensation, and pain.   ACTIVITY LIMITATIONS: carrying, lifting, bending, sitting, standing, squatting, sleeping, transfers, bed mobility, dressing, and locomotion level  PARTICIPATION LIMITATIONS: cleaning, driving, shopping, community activity, and occupation  PERSONAL FACTORS: Past/current experiences, Transportation, and 1-2 comorbidities: EDS< previous hip surgeries are also affecting patient's functional outcome.   REHAB POTENTIAL: Excellent  CLINICAL DECISION MAKING: Evolving/moderate complexity  EVALUATION COMPLEXITY: Moderate   GOALS: Goals reviewed with patient? Yes  SHORT TERM GOALS: Target date: 12/18/2023   Patient will be able to show independence for initial HEP to include posture, core and hip strength and stability.   Baseline: Goal status: MET   2.  Patient will be I with concepts of joint protection, hip precautions and stability as it pertains to joint hypermobility. Baseline:  Goal status:MET   3.  Patient will be able to ambulate in the clinic without the use of crutch and no discernible limp 300 feet  Baseline:  Goal status: MET   LONG TERM GOALS: Target date: 01/15/2024    LEFS score will increase by 16 points indicating improved functional mobility Baseline: 19/80, 02/22/24: 30/80 Goal status: ongoing   2.   Patient will be independent with home exercise program upon discharge from physical therapy Baseline:  Goal status:ongoing   3.  Patient will be able to walk without assistive device and pain no more than 2/10 In the right hip community distances > 1000 feet  Baseline:  Goal status: ongoing  4.  Patient will demonstrate a pain free squat in order to complete transfers repetitively and obtain items below the waist for work and home tasks Baseline:  Goal status: ongoing   5.  Patient will be able to demonstrate hip/knee/ankle strength to 5/5 in order to maximize functional mobility, ambulation and lifting.   Baseline:  Goal status: ongoing    6. Patient will be able to show dynamic balance recovery in single leg exercises    Baseline:   Goal status: NEW  PLAN:  PT FREQUENCY: 2x/week  PT DURATION: 8 weeks  PLANNED INTERVENTIONS: [id number]- PT Re-evaluation, 97750- Physical Performance Testing, 97110-Therapeutic exercises, 97530- Therapeutic activity, [id number]- Neuromuscular re-education, 97535- Self Care, [id number]- Manual therapy, [id number]- Gait training, [phone]- Aquatic Therapy, [phone]- Electrical stimulation (unattended), Patient/Family education, Balance training, Stair training, Taping, DME instructions, Cryotherapy, and Moist heat  PLAN FOR NEXT SESSION: Focus on closed chain exercises consider trigger point dry needling to the right TFL and glutes in order to reduce symptoms.manual . Pilates?  Delon Norma, PT 03/04/24 8:25 PM Phone: [phone] Fax: [phone]

## 2024-03-04 ENCOUNTER — Ambulatory Visit: Admitting: Psychology

## 2024-03-04 DIAGNOSIS — F332 Major depressive disorder, recurrent severe without psychotic features: Secondary | ICD-10-CM | POA: Diagnosis not present

## 2024-03-04 DIAGNOSIS — F411 Generalized anxiety disorder: Secondary | ICD-10-CM | POA: Diagnosis not present

## 2024-03-04 DIAGNOSIS — F4312 Post-traumatic stress disorder, chronic: Secondary | ICD-10-CM | POA: Diagnosis not present

## 2024-03-05 DIAGNOSIS — G43719 Chronic migraine without aura, intractable, without status migrainosus: Secondary | ICD-10-CM | POA: Diagnosis not present

## 2024-03-10 DIAGNOSIS — F332 Major depressive disorder, recurrent severe without psychotic features: Secondary | ICD-10-CM | POA: Diagnosis not present

## 2024-03-11 DIAGNOSIS — F411 Generalized anxiety disorder: Secondary | ICD-10-CM | POA: Diagnosis not present

## 2024-03-11 DIAGNOSIS — F4312 Post-traumatic stress disorder, chronic: Secondary | ICD-10-CM | POA: Diagnosis not present

## 2024-03-11 DIAGNOSIS — F332 Major depressive disorder, recurrent severe without psychotic features: Secondary | ICD-10-CM | POA: Diagnosis not present

## 2024-03-13 ENCOUNTER — Ambulatory Visit (INDEPENDENT_AMBULATORY_CARE_PROVIDER_SITE_OTHER): Admitting: Physical Therapy

## 2024-03-13 ENCOUNTER — Encounter: Payer: Self-pay | Admitting: Physical Therapy

## 2024-03-13 DIAGNOSIS — M6281 Muscle weakness (generalized): Secondary | ICD-10-CM | POA: Diagnosis not present

## 2024-03-13 DIAGNOSIS — M357 Hypermobility syndrome: Secondary | ICD-10-CM

## 2024-03-13 DIAGNOSIS — Z96641 Presence of right artificial hip joint: Secondary | ICD-10-CM | POA: Diagnosis not present

## 2024-03-13 DIAGNOSIS — M25551 Pain in right hip: Secondary | ICD-10-CM

## 2024-03-13 NOTE — Therapy (Signed)
 OUTPATIENT PHYSICAL THERAPY LOWER EXTREMITY TREATMENT    Patient Name: Brianna [name] MRN: [id number] DOB:August 22, 1995, 28 y.o., female Today's Date: 03/03/2024  END OF SESSION:  PT End of Session - 03/13/24 1520     Visit Number 17    Number of Visits 23    Date for Recertification  04/18/24    Authorization Type BC[employer]    PT Start Time 1318    PT Stop Time 1400    PT Time Calculation (min) 42 min    Activity Tolerance Patient tolerated treatment well    Behavior During Therapy [facility] for tasks assessed/performed              Past Medical History:  Diagnosis Date   Acute recurrent pansinusitis 10/21/2019   ADHD (attention deficit hyperactivity disorder)    inattentive   Allergy    Anemia    had iron infusions in Fall of 2023   Anxiety    Arthritis    Joints, hips   Asthma    EIA   Bronchitis    Cat allergy due to both airborne and skin contact 05/01/2016   Chronic sore throat 05/01/2016   COVID 2020   Depression    Ehlers-Danlos syndrome    Fibromyalgia    pt denies   Gastroparesis    GERD (gastroesophageal reflux disease)    History of hypotension    taken midodrine  in the past- Spring of 2022   Hypermobile Ehlers-Danlos syndrome    Irregular heart rate    Keratosis pilaris    Migraine    w and w/o [place]   PCOS (polycystic ovarian syndrome) 10/26/2022   PONV (postoperative nausea and vomiting)    I can get really cold   PTSD (post-traumatic stress disorder)    Seasonal allergic rhinitis 03/11/2014   Past Surgical History:  Procedure Laterality Date   CHOLECYSTECTOMY  05/09/2021   ESOPHAGOGASTRODUODENOSCOPY ENDOSCOPY     03/2020   HIP ARTHROPLASTY Right    HIP ARTHROSCOPY Right 04/25/2022   Procedure: RIGHT HIP ARTHROSCOPY WITH LABRAL RECONSTRUCTION, ILIOTIBIAL BAND ALLOGRAFT, PSOAS RELEASE;  Surgeon: Genelle Standing, MD;  Location: [facility] OR;  Service: Orthopedics;  Laterality: Right;   HIP SURGERY Right    per pt   JOINT REPLACEMENT      Upcoming THR right hip 09/03/23   LABRAL REPAIR Right 03/02/2021   Procedure: RIGHT HIP ARTHROSCOPY WITH LABRAL REPAIR AND PINCE DEBRIDEMENT;  Surgeon: Genelle Standing, MD;  Location: [facility] OR;  Service: Orthopedics;  Laterality: Right;   SMART PILL PROCEDURE     12/2020   TYMPANOSTOMY TUBE PLACEMENT Bilateral    WISDOM TOOTH EXTRACTION     Patient Active Problem List   Diagnosis Date Noted   Chronic night sweats 08/21/2023   PCOS (polycystic ovarian syndrome) 10/26/2022   Cervical adenopathy 06/10/2021   Chronic pruritus 06/10/2021   Exercise-induced asthma 06/09/2021   Chronic venous insufficiency 06/03/2021   Orthostatic lightheadedness 06/03/2021   Purple toe syndrome of both feet (HCC) 06/03/2021   Autonomic dysfunction 06/03/2021   Tear of right acetabular labrum 04/13/2021   Migraine headache 03/01/2021   Headache 11/30/2020   Positive ANA (antinuclear antibody) 11/15/2020   Fibromyalgia 11/02/2020   Hair loss 11/02/2020   Muscle spasticity 10/22/2020   Hypermobile Ehlers-Danlos syndrome 10/22/2020   Paresthesia of bilateral legs 10/06/2020   Myalgia 10/06/2020   Chronic pelvic pain in female 10/06/2020   Chronic abdominal pain 10/06/2020   Irritable colon 08/02/2020   Pelvic floor dysfunction 08/02/2020  Irritant dermatitis 08/02/2020   Gastroesophageal reflux disease 06/28/2020   Severe recurrent major depression without psychotic features (HCC) 06/28/2020   Polyarthralgia 06/28/2020   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 06/28/2020   Tachycardia 06/28/2020   History of COVID-19 06/28/2020   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 deficit 06/28/2020   Recurrent major depressive disorder, in full remission 06/28/2020   Atypical chest pain 06/22/2020   Dizziness 06/22/2020   Palpitations 06/14/2020   Anxiety 05/04/2020   Internal and external hemorrhoids without complication 04/29/2020   Nondiabetic gastroparesis 03/03/2020   ETD  (Eustachian tube dysfunction), bilateral 10/21/2019   Acne vulgaris 03/11/2014   Mild intermittent asthma without complication 03/11/2014   Seasonal allergies 03/11/2014    PCP: Wendolyn Jenkins Jansky MD  REFERRING PROVIDER: Ezzard Redell Ezzard MD  REFERRING DIAG: [phone] (ICD-10-CM) - 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      Patient presents physical therapy evaluation status post right anterior hip replacement.  She has a history of 2 labral repairs in the past but after multiple surgical consultations it was found that her hip dysplasia was causing osteoarthritis and significant joint degeneration.  This is a sensitive topic for her because she has been dealing with this problem for several years and wonders how no one found hip dysplasia on her x-ray.  She opted for total hip replacement and overall is pleased with her progress.  She had home health physical therapy for 2 weeks following discharge from hospital.  Overall she has been keeping up with her exercises.  She walks with 1 crutch without incident but cannot limp the only thing I have difficulty with is putting on my socks.  She does have some issues with pain in her proximal anterior thigh and hip.  She has some numbness and tingling around the site.  She is planning to move back into her home this week which is on the first level.  She has not seen the surgeon yet but has an appointment in a few weeks.   She was unclear about any precautions other than what her home health therapist gave her.   PERTINENT HISTORY: Ehlers-Danlos syndrome, symptoms of dysautonomia including palpitations dizziness and heat intolerance  chronic joint and muscle pain  MD note:  Postoperative hip pain - Persistent left hip pain since total hip arthroplasty performed three months ago - Pain rated at 6 out of 10 in intensity - New onset of different pain and popping sensation in the hip while getting into her car - Pain was most intense during the first three days after the incident and has since remained consistent - Pain flares with activities such as walking, driving, and physical therapy - Pain is aggravated by movements that activate the hip flexor - Unable to perform hip flexor activities without significant discomfort - Recent return to work has increased walking and subsequently worsened pain - Concern for developing gait abnormalities due to pain and hip weakness  Pain management and medication intolerance - Uses ice for pain management - Unable to  tolerate NSAIDs due to history of adverse effects and gastroparesis - Not taking any medication for pain    PAIN:  Are you having pain? Yes: NPRS scale: 4 /10  Pain location: Rt antlat. hip  Pain description: throbbing, achy  Aggravating factors: standing, walking  Relieving factors: Changing positions but something that helps the most is an ice pack which she uses frequently  PRECAUTIONS: Anterior hip patient would be wise to follow due to collagen defect due to hypermobile Ehlers-Danlos syndrome  RED FLAGS: None   WEIGHT BEARING RESTRICTIONS: No  FALLS:  Has patient fallen in last 6 months? No  LIVING ENVIRONMENT: Lives with: lives alone Lives in: House/apartment Stairs: No Has following equipment at home: Crutches shower chair   OCCUPATION: [employer] out until 8/11 .   PLOF: Independent  PATIENT GOALS: I want to be walk pain free    NEXT MD VISIT:   OBJECTIVE:  Note: Objective measures were completed at Evaluation unless otherwise noted.  DIAGNOSTIC FINDINGS: see chart ([facility])   PATIENT SURVEYS:   COGNITION: Overall cognitive status: Within functional limits for tasks assessed     SENSATION: Patient reports numbness and tingling near incision site and along the lateral thigh  EDEMA:  Minimal surrounding incision Patient points out a apparent deformity on the right lateral hip just distal to the greater trochanter.  She reports that was present before her total hip replacement but is questioning what it is it did feel a bit puffy to me but it could also be gluteus atrophy which makes it look a little bit more pronounced  POSTURE: No Significant postural limitations  PALPATION: Tender to palpation along  well-healed incision  LOWER EXTREMITY MNF:[facility] hip ER/IR   Active ROM Right eval Left eval Rt 01/07/24 Rt. 02/22/24  Hip flexion P90 P 120 115 Pain 120 pinch   Hip extension      Hip abduction      Hip adduction      Hip internal rotation A24,P25 A35, P30 27 Pain  20  Hip external rotation A3, P10 A30, P50  47 Pain  45  Knee flexion      Knee extension      Ankle dorsiflexion      Ankle plantarflexion      Ankle inversion      Ankle eversion       (Blank rows = not tested)  LOWER EXTREMITY MMT: 02/22/24: LLE 5/5  MMT Right eval Left eval Rt  01/07/24 Rt. 02/22/24  Hip flexion 4 5 NT  (3-/5?) 3-/5   Hip extension   4/5 5/5  Hip abduction      Hip adduction   4/5   Hip internal rotation 4 4  4+  Hip external rotation 3+ 4  4  Knee flexion 4+ 5  5  Knee extension 5 5  5   Ankle dorsiflexion    5  Ankle plantarflexion      Ankle inversion      Ankle eversion       (Blank rows = not tested)    FUNCTIONAL TESTS:  5 times sit to stand: 16 seconds 30 seconds chair stand test, 9 repetitions  GAIT: Distance walked: 100 feet Assistive device utilized: Crutches x1 Level of assistance:  Complete Independence Comments: Patient without appreciable limp for the first 75 feet, slower more cautious pace  TREATMENT DATE:   [facility] Adult PT Treatment:                                                DATE: 03/13/24 Therapeutic Exercise: Deferred Bike today  3 way hamstring  Bridging  Single leg bridge  Sidelying hip abduction and side circles  Closed chain exercise:  15 lbs squat, RDL x 15 each  [place] split no wgt each side   SLS with hip abduction , extension  Moved to reformer for standing right leg extension and lunging, scooter 1 red spring  Manual Therapy: Prone light joint mobilization to the right lower extremity Rt IASTM Rt thigh, quads, TFL    03/03/24: Therapeutic Exercise: Aerobic: Supine: bridging 2 x 10;  Seated: S/L:  hip abd 2 x 10 on R;  Standing:  Band walks GTB x2 (mild dizziness)  Stretches:  Neuromuscular Re-education: Manual Therapy: IASTM to anterior hip musculature, tfl , rec fem and vastus lateralis  Therapeutic Activity:   Step ups with opp knee drive,  no UE support 2 x 10 bil;  Lateral step ups x 10 bil;  Self Care: Trigger Point Dry Needling  Initial Treatment: Pt instructed on Dry Needling rational, procedures, and possible side effects. Pt instructed to expect mild to moderate muscle soreness later in the day and/or into the next day.  Pt instructed in methods to reduce muscle soreness. Pt instructed to continue prescribed HEP. Patient was educated on signs and symptoms of infection and other risk factors and advised to seek medical attention should they occur.  Patient verbalized understanding of these instructions and education.   Patient Verbal Consent Given: Yes Education Handout Provided: Yes Muscles Treated: R TFL, R vastus lateralis, R piriformis Electrical Stimulation Performed: No Treatment  Response/Outcome: palpable increase in muscle length     [facility] Adult PT Treatment:                                                DATE: 02/22/24 Re-Eval  2 min walk 417 feet , pain increased to 6.5/10  Hip flexor Rt LE off table Review HEP, POC and dry needling options , safety.    [facility] Adult PT Treatment:                                                DATE: 01/09/24 Therapeutic Exercise: Rt hip flexor stretch off edge of table IASTM to Rt ant thigh in prep for work  Stage manager with ball  x 10 (no pain)  Bridge with HS curl  combo x 10  SLR leg on ball (Rt) x 10  Step up with opp leg lift 6 inch x 2 x 10  Sumo Squat 10 lbs x 10 , pain with full extension  Narrow parallel squat x 10 , 10 lbs  RDL 10 lbs  x 10   Self Care: Plan of care MRI vs msk US       PATIENT EDUCATION:  Education details: hip precautions, POC, HEP  Person educated: Patient Education method: Explanation, Demonstration, Verbal cues, and Handouts  Education comprehension: verbalized understanding, returned demonstration, and needs further education  HOME EXERCISE PROGRAM: Access Code: [email or web address] URL: [email or web address] Date: 11/20/2023 Prepared by: Delon Norma  Exercises - Supine Quad Set  - 1 x daily - 7 x weekly - 2 sets - 10 reps - 30 hold - Supine Bridge  - 1 x daily - 7 x weekly - 2 sets - 10 reps - 30 hold (SMALL RANGE OF MOTION)  - Sit to Stand  - 1 x daily - 7 x weekly - 2 sets - 10 reps - 5 hold - Standing Hip Abduction with Counter Support  - 1 x daily - 7 x weekly - 2 sets - 10 reps - 5 hold (TOE FORWARD)  - Standing Marching  - 1 x daily - 7 x weekly - 2 sets - 10 reps - 5 hold -hip flexor stretching    ASSESSMENT:  CLINICAL IMPRESSION: Patient was able to tolerate all exercises today with good alignment and control, including [place] split squats with light upper extremity support.  Continues to benefit from skilled therapy including light manual  techniques to reduce tension along the right anterolateral hip.  Trigger point dry needling felt good but only for a temporary time.     OBJECTIVE IMPAIRMENTS: decreased activity tolerance, decreased endurance, decreased knowledge of use of DME, decreased mobility, difficulty walking, decreased ROM, decreased strength, increased edema, increased fascial restrictions, increased muscle spasms, impaired sensation, and pain.   ACTIVITY LIMITATIONS: carrying, lifting, bending, sitting, standing, squatting, sleeping, transfers, bed mobility, dressing, and locomotion level  PARTICIPATION LIMITATIONS: cleaning, driving, shopping, community activity, and occupation  PERSONAL FACTORS: Past/current experiences, Transportation, and 1-2 comorbidities: EDS< previous hip surgeries are also affecting patient's functional outcome.   REHAB POTENTIAL: Excellent  CLINICAL DECISION MAKING: Evolving/moderate complexity  EVALUATION COMPLEXITY: Moderate   GOALS: Goals reviewed with patient? Yes  SHORT TERM GOALS: Target date: 12/18/2023   Patient will be able to show independence for initial HEP to include posture, core and hip strength and stability.   Baseline: Goal status: MET   2.  Patient will be I with concepts of joint protection, hip precautions and stability as it pertains to joint hypermobility. Baseline:  Goal status:MET   3.  Patient will be able to ambulate in the clinic without the use of crutch and no discernible limp 300 feet  Baseline:  Goal status: MET   LONG TERM GOALS: Target date: 01/15/2024    LEFS score will increase by 16 points indicating improved functional mobility Baseline: 19/80, 02/22/24: 30/80 Goal status: ongoing   2.  Patient will be independent with home exercise program upon discharge from physical therapy Baseline:  Goal status:ongoing   3.  Patient will be able to walk without assistive device and pain no more than 2/10 In the right hip community distances >  1000 feet  Baseline:  Goal status: ongoing  4.  Patient will demonstrate a pain free squat in order to complete transfers repetitively and obtain items below the waist for work and home tasks Baseline:  Goal status: ongoing , can do a loaded box squat but has pinching with full deep squat to the floor  5.  Patient will be able to demonstrate hip/knee/ankle strength to 5/5 in order to maximize functional mobility, ambulation and lifting.   Baseline:  Goal status: ongoing    6. Patient will be able to show dynamic balance recovery in single leg exercises    Baseline:   Goal status: NEW  PLAN:  PT FREQUENCY: 2x/week  PT DURATION: 8 weeks  PLANNED INTERVENTIONS: 97164- PT Re-evaluation, 97750- Physical Performance Testing, 97110-Therapeutic exercises, 97530- Therapeutic activity, [id number]- Neuromuscular re-education, 97535- Self Care, [id number]- Manual therapy, [phone]- Gait training, [phone]- Aquatic Therapy, [phone]- Electrical stimulation (unattended), Patient/Family education, Balance training, Stair training, Taping, DME instructions, Cryotherapy, and Moist heat  PLAN FOR NEXT SESSION: Focus on closed chain exercises consider trigger point dry needling to the right TFL and glutes in order to reduce symptoms.manual . Pilates?  Delon Norma, PT 03/13/24 4:59 PM Phone: [phone] Fax: [phone]

## 2024-03-17 DIAGNOSIS — G8929 Other chronic pain: Secondary | ICD-10-CM | POA: Diagnosis not present

## 2024-03-17 DIAGNOSIS — M542 Cervicalgia: Secondary | ICD-10-CM | POA: Diagnosis not present

## 2024-03-17 DIAGNOSIS — M7918 Myalgia, other site: Secondary | ICD-10-CM | POA: Diagnosis not present

## 2024-03-17 DIAGNOSIS — M5441 Lumbago with sciatica, right side: Secondary | ICD-10-CM | POA: Diagnosis not present

## 2024-03-17 DIAGNOSIS — M5442 Lumbago with sciatica, left side: Secondary | ICD-10-CM | POA: Diagnosis not present

## 2024-03-18 DIAGNOSIS — F4312 Post-traumatic stress disorder, chronic: Secondary | ICD-10-CM | POA: Diagnosis not present

## 2024-03-18 DIAGNOSIS — F332 Major depressive disorder, recurrent severe without psychotic features: Secondary | ICD-10-CM | POA: Diagnosis not present

## 2024-03-20 ENCOUNTER — Ambulatory Visit: Admitting: Psychology

## 2024-03-20 ENCOUNTER — Encounter: Admitting: Physical Therapy

## 2024-03-20 DIAGNOSIS — L700 Acne vulgaris: Secondary | ICD-10-CM | POA: Diagnosis not present

## 2024-03-24 ENCOUNTER — Encounter: Payer: Self-pay | Admitting: Radiology

## 2024-03-24 DIAGNOSIS — R002 Palpitations: Secondary | ICD-10-CM | POA: Diagnosis not present

## 2024-03-24 DIAGNOSIS — R42 Dizziness and giddiness: Secondary | ICD-10-CM | POA: Diagnosis not present

## 2024-03-25 ENCOUNTER — Ambulatory Visit (INDEPENDENT_AMBULATORY_CARE_PROVIDER_SITE_OTHER): Admitting: Physical Therapy

## 2024-03-25 ENCOUNTER — Encounter: Payer: Self-pay | Admitting: Physical Therapy

## 2024-03-25 DIAGNOSIS — M357 Hypermobility syndrome: Secondary | ICD-10-CM | POA: Diagnosis not present

## 2024-03-25 DIAGNOSIS — M25551 Pain in right hip: Secondary | ICD-10-CM

## 2024-03-25 DIAGNOSIS — Z96641 Presence of right artificial hip joint: Secondary | ICD-10-CM

## 2024-03-25 DIAGNOSIS — M6281 Muscle weakness (generalized): Secondary | ICD-10-CM

## 2024-03-25 NOTE — Therapy (Signed)
 OUTPATIENT PHYSICAL THERAPY LOWER EXTREMITY TREATMENT    Patient Name: Brianna [name] MRN: [id number] DOB:09-Dec-1995, 28 y.o., female Today's Date: 03/03/2024  END OF SESSION:  PT End of Session - 03/25/24 1606     Visit Number 18    Number of Visits 23    Date for Recertification  04/18/24    Authorization Type [employer]    PT Start Time 1602    PT Stop Time 1645    PT Time Calculation (min) 43 min    Activity Tolerance Patient tolerated treatment well    Behavior During Therapy [facility] for tasks assessed/performed               Past Medical History:  Diagnosis Date   Acute recurrent pansinusitis 10/21/2019   ADHD (attention deficit hyperactivity disorder)    inattentive   Allergy    Anemia    had iron infusions in Fall of 2023   Anxiety    Arthritis    Joints, hips   Asthma    EIA   Bronchitis    Cat allergy due to both airborne and skin contact 05/01/2016   Chronic sore throat 05/01/2016   COVID 2020   Depression    Ehlers-Danlos syndrome    Fibromyalgia    pt denies   Gastroparesis    GERD (gastroesophageal reflux disease)    History of hypotension    taken midodrine  in the past- Spring of 2022   Hypermobile Ehlers-Danlos syndrome    Irregular heart rate    Keratosis pilaris    Migraine    w and w/o auras   PCOS (polycystic ovarian syndrome) 10/26/2022   PONV (postoperative nausea and vomiting)    I can get really cold   PTSD (post-traumatic stress disorder)    Seasonal allergic rhinitis 03/11/2014   Past Surgical History:  Procedure Laterality Date   CHOLECYSTECTOMY  05/09/2021   ESOPHAGOGASTRODUODENOSCOPY ENDOSCOPY     03/2020   HIP ARTHROPLASTY Right    HIP ARTHROSCOPY Right 04/25/2022   Procedure: RIGHT HIP ARTHROSCOPY WITH LABRAL RECONSTRUCTION, ILIOTIBIAL BAND ALLOGRAFT, PSOAS RELEASE;  Surgeon: Genelle Standing, MD;  Location: [facility] OR;  Service: Orthopedics;  Laterality: Right;   HIP SURGERY Right    per pt   JOINT REPLACEMENT      Upcoming THR right hip 09/03/23   LABRAL REPAIR Right 03/02/2021   Procedure: RIGHT HIP ARTHROSCOPY WITH LABRAL REPAIR AND PINCE DEBRIDEMENT;  Surgeon: Genelle Standing, MD;  Location: [facility] OR;  Service: Orthopedics;  Laterality: Right;   SMART PILL PROCEDURE     12/2020   TYMPANOSTOMY TUBE PLACEMENT Bilateral    WISDOM TOOTH EXTRACTION     Patient Active Problem List   Diagnosis Date Noted   Chronic night sweats 08/21/2023   PCOS (polycystic ovarian syndrome) 10/26/2022   Cervical adenopathy 06/10/2021   Chronic pruritus 06/10/2021   Exercise-induced asthma 06/09/2021   Chronic venous insufficiency 06/03/2021   Orthostatic lightheadedness 06/03/2021   Purple toe syndrome of both feet (HCC) 06/03/2021   Autonomic dysfunction 06/03/2021   Tear of right acetabular labrum 04/13/2021   Migraine headache 03/01/2021   Headache 11/30/2020   Positive ANA (antinuclear antibody) 11/15/2020   Fibromyalgia 11/02/2020   Hair loss 11/02/2020   Muscle spasticity 10/22/2020   Hypermobile Ehlers-Danlos syndrome 10/22/2020   Paresthesia of bilateral legs 10/06/2020   Myalgia 10/06/2020   Chronic pelvic pain in female 10/06/2020   Chronic abdominal pain 10/06/2020   Irritable colon 08/02/2020   Pelvic floor dysfunction 08/02/2020  Irritant dermatitis 08/02/2020   Gastroesophageal reflux disease 06/28/2020   Severe recurrent major depression without psychotic features (HCC) 06/28/2020   Polyarthralgia 06/28/2020   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 06/28/2020   Tachycardia 06/28/2020   History of COVID-19 06/28/2020   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 deficit 06/28/2020   Recurrent major depressive disorder, in full remission 06/28/2020   Atypical chest pain 06/22/2020   Dizziness 06/22/2020   Palpitations 06/14/2020   Anxiety 05/04/2020   Internal and external hemorrhoids without complication 04/29/2020   Nondiabetic gastroparesis 03/03/2020   ETD  (Eustachian tube dysfunction), bilateral 10/21/2019   Acne vulgaris 03/11/2014   Mild intermittent asthma without complication 03/11/2014   Seasonal allergies 03/11/2014    PCP: Wendolyn Jenkins Jansky MD  REFERRING PROVIDER: Ezzard Redell Ezzard MD  REFERRING DIAG: [phone] (ICD-10-CM) - 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  Never less than 4/10. I still have pain with lifting leg.  I have to be careful.  I feel like something will pop.     Patient presents physical therapy evaluation status post right anterior hip replacement.  She has a history of 2 labral repairs in the past but after multiple surgical consultations it was found that her hip dysplasia was causing osteoarthritis and significant joint degeneration.  This is a sensitive topic for her because she has been dealing with this problem for several years and wonders how no one found hip dysplasia on her x-ray.  She opted for total hip replacement and overall is pleased with her progress.  She had home health physical therapy for 2 weeks following discharge from hospital.  Overall she has been keeping up with her exercises.  She walks with 1 crutch without incident but cannot limp the only thing I have difficulty with is putting on my socks.  She does have some issues with pain in her proximal anterior thigh and hip.  She has some numbness and tingling around the site.  She is planning to move back into her home this week which is on the first level.  She has not seen the  surgeon yet but has an appointment in a few weeks.  She was unclear about any precautions other than what her home health therapist gave her.   PERTINENT HISTORY: Ehlers-Danlos syndrome, symptoms of dysautonomia including palpitations dizziness and heat intolerance  chronic joint and muscle pain  MD note:  Postoperative hip pain - Persistent left hip pain since total hip arthroplasty performed three months ago - Pain rated at 6 out of 10 in intensity - New onset of different pain and popping sensation in the hip while getting into her car - Pain was most intense during the first three days after the incident and has since remained consistent - Pain flares with activities such as walking, driving, and physical therapy - Pain is aggravated by movements that activate the hip flexor - Unable to perform hip flexor activities without significant discomfort - Recent return to work has increased walking and subsequently worsened pain - Concern for developing gait abnormalities due to pain and hip weakness  Pain management and medication intolerance - Uses ice for pain management - Unable to tolerate NSAIDs due to history of adverse effects and gastroparesis - Not taking any medication for pain    PAIN:  Are you having pain? Yes: NPRS scale: 4-5/10  Pain location: Rt antlat. hip  Pain description: throbbing, achy  Aggravating factors: standing, walking  Relieving factors: Changing positions but something that helps the most is an ice pack which she uses frequently  PRECAUTIONS: Anterior hip patient would be wise to follow due to collagen defect due to hypermobile Ehlers-Danlos syndrome  RED FLAGS: None   WEIGHT BEARING RESTRICTIONS: No  FALLS:  Has patient fallen in last 6 months? No  LIVING ENVIRONMENT: Lives with: lives alone Lives in: House/apartment Stairs: No Has following equipment at home: Crutches shower chair   OCCUPATION: [employer] out until 8/11 .   PLOF:  Independent  PATIENT GOALS: I want to be walk pain free   NEXT MD VISIT:   OBJECTIVE:  Note: Objective measures were completed at Evaluation unless otherwise noted.  DIAGNOSTIC FINDINGS: see chart ([facility])   PATIENT SURVEYS:   COGNITION: Overall cognitive status: Within functional limits for tasks assessed     SENSATION: Patient reports numbness and tingling near incision site and along the lateral thigh  EDEMA:  Minimal surrounding incision Patient points out a apparent deformity on the right lateral hip just distal to the greater trochanter.  She reports that was present before her total hip replacement but is questioning what it is it did feel a bit puffy to me but it could also be gluteus atrophy which makes it look a little bit more pronounced  POSTURE: No Significant postural limitations  PALPATION: Tender to palpation along  well-healed incision  LOWER EXTREMITY MNF:[facility] hip ER/IR   Active ROM Right eval Left eval Rt 01/07/24 Rt. 02/22/24  Hip flexion P90 P 120 115 Pain 120 pinch   Hip extension      Hip abduction      Hip adduction      Hip internal rotation A24,P25 A35, P30 27 Pain  20  Hip external rotation A3, P10 A30, P50  47 Pain  45  Knee flexion      Knee extension      Ankle dorsiflexion      Ankle plantarflexion      Ankle inversion      Ankle eversion       (Blank rows = not tested)  LOWER EXTREMITY MMT: 02/22/24: LLE 5/5  MMT Right eval Left eval Rt  01/07/24 Rt. 02/22/24  Hip flexion 4 5 NT  (3-/5?) 3-/5   Hip extension   4/5 5/5  Hip abduction      Hip adduction   4/5   Hip internal rotation 4 4  4+  Hip external rotation 3+ 4  4  Knee flexion 4+ 5  5  Knee extension 5 5  5   Ankle dorsiflexion    5  Ankle plantarflexion      Ankle inversion      Ankle eversion       (Blank rows = not tested)    FUNCTIONAL TESTS:  5 times sit to stand: 16 seconds 30 seconds chair stand test, 9 repetitions  GAIT: Distance walked: 100  feet Assistive device utilized: Crutches x1 Level of assistance: Complete Independence Comments: Patient without appreciable limp for the first 75 feet, slower more cautious pace  TREATMENT DATE:    [facility] Adult PT Treatment:                                                DATE: 03/25/24 Manual Therapy: IASTM Rt hip, glute/ITB Soft tissue mobilization TFL, glute med  Therapeutic Activity: SLR  SLR adductor  IR in sidelying ball between knees  Hip abduction  Sidekick flex/ext  Sideplank forearm with clam x 10  Reverse step down/hinge  BOSU forward lunge alt.  Added upper trunk rotation  Partial squat on BOSU (flat side up)  Head turns  Hip flexor stretch each side    [facility] Adult PT Treatment:                                                DATE: 03/13/24 Therapeutic Exercise: Deferred Bike today  3 way hamstring  Bridging  Single leg bridge  Sidelying hip abduction and side circles  Closed chain exercise:  15 lbs squat, RDL x 15 each  Bulgarian split no wgt each side   SLS with hip abduction , extension  Moved to reformer for standing right leg extension and lunging, scooter 1 red spring  Manual Therapy: Prone light joint mobilization to the right lower extremity Rt IASTM Rt thigh, quads, TFL    03/03/24: Therapeutic Exercise: Aerobic: Supine: bridging 2 x 10;  Seated: S/L:  hip abd 2 x 10 on R;  Standing:  Band walks GTB x2 (mild dizziness)  Stretches:  Neuromuscular Re-education: Manual Therapy: IASTM to anterior hip musculature, tfl , rec fem and vastus lateralis  Therapeutic Activity:   Step ups with opp knee drive,  no UE support 2 x 10 bil;  Lateral step ups x 10 bil;  Self Care: Trigger Point Dry Needling  Initial Treatment: Pt instructed on Dry Needling rational, procedures, and possible side effects. Pt instructed to expect mild  to moderate muscle soreness later in the day and/or into the next day.  Pt instructed in methods to reduce muscle soreness. Pt instructed to continue prescribed HEP. Patient was educated on signs and symptoms of infection and other risk factors and advised to seek medical attention should they occur.  Patient verbalized understanding of these instructions and education.   Patient Verbal Consent Given: Yes Education Handout Provided: Yes Muscles Treated: R TFL, R vastus lateralis, R piriformis Electrical Stimulation Performed: No Treatment Response/Outcome: palpable increase in muscle length     [facility] Adult PT Treatment:                                                DATE: 02/22/24 Re-Eval  2 min walk 417 feet , pain increased to 6.5/10  Hip flexor Rt LE off table Review HEP, POC and dry needling options , safety.    [facility] Adult PT Treatment:                                                DATE:  01/09/24 Therapeutic Exercise: Rt hip flexor stretch off edge of table IASTM to Rt ant thigh in prep for work  Stage Manager with ball  x 10 (no pain)  Bridge with HS curl  combo x 10  SLR leg on ball (Rt) x 10  Step up with opp leg lift 6 inch x 2 x 10  Sumo Squat 10 lbs x 10 , pain with full extension  Narrow parallel squat x 10 , 10 lbs  RDL 10 lbs  x 10   Self Care: Plan of care MRI vs msk US       PATIENT EDUCATION:  Education details: hip precautions, POC, HEP  Person educated: Patient Education method: Programmer, Multimedia, Demonstration, Verbal cues, and Handouts Education comprehension: verbalized understanding, returned demonstration, and needs further education  HOME EXERCISE PROGRAM: Access Code: [email or web address] URL: [email or web address] Date: 11/20/2023 Prepared by: Delon Norma  Exercises - Supine Quad Set  - 1 x daily - 7 x weekly - 2 sets - 10 reps - 30 hold - Supine Bridge  - 1 x daily - 7 x weekly - 2 sets - 10 reps - 30 hold (SMALL RANGE OF  MOTION)  - Sit to Stand  - 1 x daily - 7 x weekly - 2 sets - 10 reps - 5 hold - Standing Hip Abduction with Counter Support  - 1 x daily - 7 x weekly - 2 sets - 10 reps - 5 hold (TOE FORWARD)  - Standing Marching  - 1 x daily - 7 x weekly - 2 sets - 10 reps - 5 hold -hip flexor stretching    ASSESSMENT:  CLINICAL IMPRESSION: Savana continues to make good functional progress with her strength and mobility.  She continues to feel increased tension in the right hip especially with activation of the right hip flexor either with knee extended or flexed.  Her hip strength has improved a great deal and she can perform standing exercises symmetrically.  She will continue to benefit from skilled physical therapy in order to maximize optimal movement and restore pain-free hip mobility   Patient was able to tolerate all exercises today with good alignment and control, including Bulgarian split squats with light upper extremity support.  Continues to benefit from skilled therapy including light manual techniques to reduce tension along the right anterolateral hip.  Trigger point dry needling felt good but only for a temporary time.     OBJECTIVE IMPAIRMENTS: decreased activity tolerance, decreased endurance, decreased knowledge of use of DME, decreased mobility, difficulty walking, decreased ROM, decreased strength, increased edema, increased fascial restrictions, increased muscle spasms, impaired sensation, and pain.   ACTIVITY LIMITATIONS: carrying, lifting, bending, sitting, standing, squatting, sleeping, transfers, bed mobility, dressing, and locomotion level  PARTICIPATION LIMITATIONS: cleaning, driving, shopping, community activity, and occupation  PERSONAL FACTORS: Past/current experiences, Transportation, and 1-2 comorbidities: EDS< previous hip surgeries are also affecting patient's functional outcome.   REHAB POTENTIAL: Excellent  CLINICAL DECISION MAKING: Evolving/moderate  complexity  EVALUATION COMPLEXITY: Moderate   GOALS: Goals reviewed with patient? Yes  SHORT TERM GOALS: Target date: 12/18/2023   Patient will be able to show independence for initial HEP to include posture, core and hip strength and stability.   Baseline: Goal status: MET   2.  Patient will be I with concepts of joint protection, hip precautions and stability as it pertains to joint hypermobility. Baseline:  Goal status:MET   3.  Patient will be able to ambulate in the clinic without the use of  crutch and no discernible limp 300 feet  Baseline:  Goal status: MET   LONG TERM GOALS: Target date: 01/15/2024    LEFS score will increase by 16 points indicating improved functional mobility Baseline: 19/80, 02/22/24: 30/80 Goal status: ongoing   2.  Patient will be independent with home exercise program upon discharge from physical therapy Baseline:  Goal status:ongoing   3.  Patient will be able to walk without assistive device and pain no more than 2/10 In the right hip community distances > 1000 feet  Baseline:  Goal status: ongoing  4.  Patient will demonstrate a pain free squat in order to complete transfers repetitively and obtain items below the waist for work and home tasks Baseline:  Goal status: ongoing , can do a loaded box squat but has pinching with full deep squat to the floor  5.  Patient will be able to demonstrate hip/knee/ankle strength to 5/5 in order to maximize functional mobility, ambulation and lifting.   Baseline:  Goal status: ongoing    6. Patient will be able to show dynamic balance recovery in single leg exercises    Baseline:   Goal status: NEW  PLAN:  PT FREQUENCY: 2x/week  PT DURATION: 8 weeks  PLANNED INTERVENTIONS: [id number]- PT Re-evaluation, 97750- Physical Performance Testing, 97110-Therapeutic exercises, 97530- Therapeutic activity, [id number]- Neuromuscular re-education, 97535- Self Care, [id number]- Manual therapy, [id number]- Gait training, [phone]-  Aquatic Therapy, [phone]- Electrical stimulation (unattended), Patient/Family education, Balance training, Stair training, Taping, DME instructions, Cryotherapy, and Moist heat  PLAN FOR NEXT SESSION: Focus on closed chain exercises consider trigger point dry needling to the right TFL and glutes in order to reduce symptoms.manual . Pilates?  Delon Norma, PT 03/25/24 4:52 PM Phone: [phone] Fax: [phone]

## 2024-03-31 ENCOUNTER — Ambulatory Visit: Admitting: Family Medicine

## 2024-03-31 ENCOUNTER — Encounter: Payer: Self-pay | Admitting: Family Medicine

## 2024-03-31 ENCOUNTER — Ambulatory Visit: Payer: Self-pay

## 2024-03-31 ENCOUNTER — Ambulatory Visit: Payer: Self-pay | Admitting: Family Medicine

## 2024-03-31 VITALS — BP 112/66 | HR 74 | Temp 98.0°F | Ht 65.0 in | Wt 141.1 lb

## 2024-03-31 DIAGNOSIS — D508 Other iron deficiency anemias: Secondary | ICD-10-CM | POA: Diagnosis not present

## 2024-03-31 DIAGNOSIS — Z0000 Encounter for general adult medical examination without abnormal findings: Secondary | ICD-10-CM | POA: Diagnosis not present

## 2024-03-31 DIAGNOSIS — G909 Disorder of the autonomic nervous system, unspecified: Secondary | ICD-10-CM

## 2024-03-31 LAB — COMPREHENSIVE METABOLIC PANEL WITH GFR
ALT: 13 U/L (ref 0–35)
AST: 17 U/L (ref 0–37)
Albumin: 5 g/dL (ref 3.5–5.2)
Alkaline Phosphatase: 50 U/L (ref 39–117)
BUN: 16 mg/dL (ref 6–23)
CO2: 26 meq/L (ref 19–32)
Calcium: 9.4 mg/dL (ref 8.4–10.5)
Chloride: 102 meq/L (ref 96–112)
Creatinine, Ser: 0.73 mg/dL (ref 0.40–1.20)
GFR: 112.81 mL/min (ref >60.00)
Glucose, Bld: 71 mg/dL (ref 70–99)
Potassium: 4 meq/L (ref 3.5–5.1)
Sodium: 137 meq/L (ref 135–145)
Total Bilirubin: 0.3 mg/dL (ref 0.2–1.2)
Total Protein: 7.4 g/dL (ref 6.0–8.3)

## 2024-03-31 LAB — VITAMIN B12: Vitamin B-12: 327 pg/mL (ref 211–911)

## 2024-03-31 LAB — CBC WITH DIFFERENTIAL/PLATELET
Basophils Absolute: 0 K/uL (ref 0.0–0.1)
Basophils Relative: 1.1 % (ref 0.0–3.0)
Eosinophils Absolute: 0 K/uL (ref 0.0–0.7)
Eosinophils Relative: 1.1 % (ref 0.0–5.0)
HCT: 39.1 % (ref 36.0–46.0)
Hemoglobin: 13.1 g/dL (ref 12.0–15.0)
Lymphocytes Relative: 44.4 % (ref 12.0–46.0)
Lymphs Abs: 2 K/uL (ref 0.7–4.0)
MCHC: 33.4 g/dL (ref 30.0–36.0)
MCV: 93.8 fl (ref 78.0–100.0)
Monocytes Absolute: 0.3 K/uL (ref 0.1–1.0)
Monocytes Relative: 7 % (ref 3.0–12.0)
Neutro Abs: 2.1 K/uL (ref 1.4–7.7)
Neutrophils Relative %: 46.4 % (ref 43.0–77.0)
Platelets: 217 K/uL (ref 150.0–400.0)
RBC: 4.17 Mil/uL (ref 3.87–5.11)
RDW: 13.4 % (ref 11.5–15.5)
WBC: 4.5 K/uL (ref 4.0–10.5)

## 2024-03-31 LAB — LIPID PANEL
Cholesterol: 177 mg/dL (ref 0–200)
HDL: 72.2 mg/dL (ref >39.00)
LDL Cholesterol: 95 mg/dL (ref 0–99)
NonHDL: 105.26
Total CHOL/HDL Ratio: 2
Triglycerides: 50 mg/dL (ref 0.0–149.0)
VLDL: 10 mg/dL (ref 0.0–40.0)

## 2024-03-31 LAB — IBC + FERRITIN
Ferritin: 157.7 ng/mL (ref 10.0–291.0)
Iron: 121 ug/dL (ref 42–145)
Saturation Ratios: 34.3 % (ref 20.0–50.0)
TIBC: 352.8 ug/dL (ref 250.0–450.0)
Transferrin: 252 mg/dL (ref 212.0–360.0)

## 2024-03-31 LAB — TSH: TSH: 1.45 u[IU]/mL (ref 0.35–5.50)

## 2024-03-31 LAB — HEMOGLOBIN A1C: Hgb A1c MFr Bld: 4.8 % (ref 4.6–6.5)

## 2024-03-31 LAB — MAGNESIUM: Magnesium: 2 mg/dL (ref 1.5–2.5)

## 2024-03-31 LAB — VITAMIN D 25 HYDROXY (VIT D DEFICIENCY, FRACTURES): VITD: 28.98 ng/mL — ABNORMAL LOW (ref 30.00–100.00)

## 2024-03-31 NOTE — Patient Instructions (Signed)

## 2024-03-31 NOTE — Progress Notes (Signed)
 Phone [phone]   Subjective:   Patient is a 28 y.o. female presenting for annual physical.    Chief Complaint  Patient presents with   Annual Exam    Pt is here for CPE  annual  Discussed the use of AI scribe software for clinical note transcription with the patient, who gave verbal consent to proceed.  History of Present Illness Brianna [name] is a 28 year old female who presents for an annual physical exam.  She underwent joint replacement surgery on June 2nd and has not had any new surgeries since. Her current medications include clonazepam, Colestid, dextromethorphan, bupropion, Dupilumab (Dupixent), Zofran , promethazine , and Spravato (esketamine nasal spray) administered weekly at 84 mg. She has not yet started midodrine , which was prescribed last week.  She experiences migraines, which were previously occurring daily at a pain level of 7, primarily above her right eyebrow. She recently resumed Botox  treatment three weeks ago after a year-long hiatus, which has slightly reduced the frequency and intensity of her migraines.  She has been diagnosed with PCOS and believes she may have endometriosis, as another doctor has told her this. Despite having a Mirena  IUD placed in the summer of 2024, her menstrual cycles have become shorter and more irregular, with persistent dark brown spotting since October 28th. Her periods are very painful and light, with no bright red bleeding since the IUD insertion.  She reports significant brain fog and short-term memory issues.  She also mentions a history of low protein intake, which she believes has contributed to hair loss over the past four years.  Her mood is stable with current medications, noting that Spravato has been particularly helpful. However, she admits to binge eating due to boredom and anxiety, which has led to weight gain since her surgery in June when she weighed around 120 pounds. She has not discussed this with her  psychiatrist but has mentioned it to her therapist.  She experiences occasional chest pain and heart racing. She also reports a deep wheezing sensation at night, possibly related to her marijuana use, but does not hear the wheezing audibly.  She is concerned about her nutrition, acknowledging poor eating habits and a lack of protein in her diet. She finds meal preparation challenging and often resorts to junk food due to time constraints and being the sole person responsible for her meals.  Tenderness R medial elbow after infiltration from iron infusion     See problem oriented charting- ROS- ROS: Gen: no fever, chills  Skin: no rash, itching ENT: no ear pain, ear drainage, nasal congestion, rhinorrhea, sinus pressure, sore throat Eyes: no blurry vision, double vision Resp: no cough,,SOB CV: no CP,, LE edema,  GI: chronic GU: no dysuria, urgency, frequency, hematuria.  Menses still bad MSK: HPI Neuro:HPI Psych: HPII   The following were reviewed and entered/updated in epic: Past Medical History:  Diagnosis Date   Acute recurrent pansinusitis 10/21/2019   ADHD (attention deficit hyperactivity disorder)    inattentive   Allergy    Anemia    had iron infusions in Fall of 2023   Anxiety    Arthritis    Joints, hips   Asthma    EIA   Bronchitis    Cat allergy due to both airborne and skin contact 05/01/2016   Chronic sore throat 05/01/2016   COVID 2020   Depression    Ehlers-Danlos syndrome    Fibromyalgia    pt denies   Gastroparesis    GERD (gastroesophageal reflux disease)  History of hypotension    taken midodrine  in the past- Spring of 2022   Hypermobile Ehlers-Danlos syndrome    Irregular heart rate    Keratosis pilaris    Migraine    w and w/o auras   PCOS (polycystic ovarian syndrome) 10/26/2022   PONV (postoperative nausea and vomiting)    I can get really cold   PTSD (post-traumatic stress disorder)    Seasonal allergic rhinitis 03/11/2014    Patient Active Problem List   Diagnosis Date Noted   Chronic night sweats 08/21/2023   PCOS (polycystic ovarian syndrome) 10/26/2022   Cervical adenopathy 06/10/2021   Chronic pruritus 06/10/2021   Exercise-induced asthma 06/09/2021   Chronic venous insufficiency 06/03/2021   Orthostatic lightheadedness 06/03/2021   Purple toe syndrome of both feet (HCC) 06/03/2021   Autonomic dysfunction 06/03/2021   Tear of right acetabular labrum 04/13/2021   Migraine headache 03/01/2021   Headache 11/30/2020   Positive ANA (antinuclear antibody) 11/15/2020   Fibromyalgia 11/02/2020   Hair loss 11/02/2020   Muscle spasticity 10/22/2020   Hypermobile Ehlers-Danlos syndrome 10/22/2020   Paresthesia of bilateral legs 10/06/2020   Myalgia 10/06/2020   Chronic pelvic pain in female 10/06/2020   Chronic abdominal pain 10/06/2020   Irritable colon 08/02/2020   Pelvic floor dysfunction 08/02/2020   Irritant dermatitis 08/02/2020   Gastroesophageal reflux disease 06/28/2020   Severe recurrent major depression without psychotic features (HCC) 06/28/2020   Polyarthralgia 06/28/2020   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 06/28/2020   Tachycardia 06/28/2020   History of COVID-19 06/28/2020   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 deficit 06/28/2020   Recurrent major depressive disorder, in full remission 06/28/2020   Atypical chest pain 06/22/2020   Dizziness 06/22/2020   Palpitations 06/14/2020   Anxiety 05/04/2020   Internal and external hemorrhoids without complication 04/29/2020   Nondiabetic gastroparesis 03/03/2020   ETD (Eustachian tube dysfunction), bilateral 10/21/2019   Acne vulgaris 03/11/2014   Mild intermittent asthma without complication 03/11/2014   Seasonal allergies 03/11/2014   Past Surgical History:  Procedure Laterality Date   CHOLECYSTECTOMY  05/09/2021   ESOPHAGOGASTRODUODENOSCOPY ENDOSCOPY     03/2020   HIP ARTHROPLASTY Right    HIP  ARTHROSCOPY Right 04/25/2022   Procedure: RIGHT HIP ARTHROSCOPY WITH LABRAL RECONSTRUCTION, ILIOTIBIAL BAND ALLOGRAFT, PSOAS RELEASE;  Surgeon: Genelle Standing, MD;  Location: [facility] OR;  Service: Orthopedics;  Laterality: Right;   HIP SURGERY Right    per pt   JOINT REPLACEMENT     Upcoming THR right hip 09/03/23   LABRAL REPAIR Right 03/02/2021   Procedure: RIGHT HIP ARTHROSCOPY WITH LABRAL REPAIR AND PINCE DEBRIDEMENT;  Surgeon: Genelle Standing, MD;  Location: [facility] OR;  Service: Orthopedics;  Laterality: Right;   SMART PILL PROCEDURE     12/2020   TYMPANOSTOMY TUBE PLACEMENT Bilateral    WISDOM TOOTH EXTRACTION      Family History  Problem Relation Age of Onset   Diabetes Mother        type 1   Polycystic ovary syndrome Mother    Anxiety disorder Mother    Arthritis Mother    Kidney disease Mother    Obesity Mother    GER disease Father    Arthritis Father    Hypertension Father    ADD / ADHD Brother    Obesity Brother    Cancer Paternal Aunt    Heart disease Paternal Uncle    COPD Maternal Grandmother     Medications- reviewed and updated Current Outpatient Medications  Medication Sig Dispense Refill   botulinum toxin Type A  (BOTOX ) 100 units SOLR injection Inject 100 Units into the muscle every 3 (three) months.     clonazePAM (KLONOPIN) 1 MG tablet Take 1 mg by mouth daily as needed.     colestipol (COLESTID) 1 g tablet Take by mouth 2 (two) times daily. (Patient taking differently: Take by mouth 2 (two) times daily. Pt is taking as needed)     Dextromethorphan-buPROPion ER (AUVELITY) 45-105 MG TBCR Take by mouth.     DUPILUMAB Pierson Inject into the skin every 14 (fourteen) days.     Esketamine HCl (SPRAVATO, 84 MG DOSE, NA) Place 84 each into the nose once a week.     levonorgestrel  (MIRENA ) 20 MCG/DAY IUD 1 each by Intrauterine route once.     ondansetron  (ZOFRAN  ODT) 4 MG disintegrating tablet Take 1 tablet (4 mg total) by mouth every 8 (eight) hours as needed for nausea or  vomiting. 20 tablet 5   prochlorperazine (COMPAZINE) 10 MG tablet      promethazine  (PHENERGAN ) 25 MG tablet Take 1 tablet (25 mg total) by mouth every 6 (six) hours as needed for nausea or vomiting. 30 tablet 1   Psyllium (GERI-MUCIL) 25 % POWD Take by mouth daily as needed. 2 tsp     Tazarotene (ARAZLO) 0.045 % LOTN Apply topically at bedtime.     triamcinolone  cream (KENALOG ) 0.1 % Apply 1 Application topically daily as needed (Eczma).     Ubrogepant 100 MG TABS As needed     Multiple Vitamin (MULTIVITAMIN) TABS 1 tablet; Once a day     No current facility-administered medications for this visit.    Allergies-reviewed and updated Allergies  Allergen Reactions   Clindamycin Hives, Other (See Comments), Rash and Swelling    Other reaction(s): swelling   Molds & Smuts Other (See Comments), Hives and Itching    Other Reaction(s): Abdominal Pain   Tretinoin Dermatitis, Itching, Other (See Comments), Photosensitivity, Rash and Swelling   Amoxicillin  Diarrhea   Famotidine Diarrhea    Social History   Social History Narrative   Right handed   Objective  Objective:  BP 112/66 (BP Location: Left Arm, Patient Position: Sitting)   Pulse 74   Temp 98 F (36.7 C) (Temporal)   Ht 5' 5 (1.651 m)   Wt 141 lb 2 oz (64 kg)   LMP 03/21/2024 (Exact Date)   SpO2 98%   BMI 23.48 kg/m  Physical Exam  Gen: WDWN NAD HEENT: NCAT, conjunctiva not injected, sclera nonicteric TM WNL B, OP moist, no exudates  NECK:  supple, no thyromegaly, no nodes, no carotid bruits CARDIAC: RRR, S1S2+, no murmur. DP 2+B LUNGS: CTAB. No wheezes ABDOMEN:  BS+, soft, NTND, No HSM, no masses EXT:  no edema MSK: no gross abnormalities. MS 5/5 all 4 NEURO: A&O x3.  CN II-XII intact.  PSYCH: normal mood. Good eye contact   Skin: iron deposit infiltration R antecubital fossa     Assessment and Plan   Health Maintenance counseling: 1. Anticipatory guidance: Patient counseled regarding regular dental exams  q6 months, eye exams,  avoiding smoking and second hand smoke, limiting alcohol to 1 beverage per day, no illicit drugs.   2. Risk factor reduction:  Advised patient of need for regular exercise and diet rich and fruits and vegetables to reduce risk of heart attack and stroke. Exercise- encouraged.  Wt Readings from Last 3 Encounters:  03/31/24 141 lb 2 oz (64 kg)  11/13/23 124  lb (56.2 kg)  10/29/23 127 lb (57.6 kg)   3. Immunizations/screenings/ancillary studies Immunization History  Administered Date(s) Administered   Dtap, Unspecified 02/24/1997, 04/23/1997, 07/06/1997, 07/13/1998, 12/24/2001   Fluzone Influenza virus vaccine,trivalent (IIV3), split virus 02/20/2020   HPV Quadrivalent 09/04/2012   Influenza Inj Mdck Quad Pf 02/04/2022   Influenza, Seasonal, Injecte, Preservative Fre 02/25/2024   Influenza,inj,Quad PF,6+ Mos 05/02/2015, 03/17/2019, 03/07/2020, 02/21/2021   Influenza-Unspecified 05/02/2015, 03/17/2019, 03/07/2020, 03/06/2023   PFIZER Comirnaty(Gray Top)Covid-19 Tri-Sucrose Vaccine 09/13/2020   PFIZER(Purple Top)SARS-COV-2 Vaccination 07/31/2019, 08/25/2019, 02/20/2020, 03/07/2020   Pfizer Covid-19 Vaccine Bivalent Booster 18yrs & up 09/13/2021   Tdap 12/10/2014, 03/17/2019   Unspecified SARS-COV-2 Vaccination 02/10/2022   Health Maintenance Due  Topic Date Due   Cervical Cancer Screening (Pap smear)  01/11/2024    4. Cervical cancer screening: utd 5. Skin cancer screening- advised regular sunscreen use. Denies worrisome, changing, or new skin lesions.  6. Birth control/STD check: IUD 7. Smoking associated screening: non smoker 8. Alcohol screening: neg  Wellness examination -     Lipid panel -     Comprehensive metabolic panel with GFR -     CBC with Differential/Platelet -     Hemoglobin A1c -     TSH -     IBC + Ferritin -     Magnesium -     Vitamin B12 -     VITAMIN D 25 Hydroxy (Vit-D Deficiency, Fractures)  Autonomic dysfunction -      Comprehensive metabolic panel with GFR -     CBC with Differential/Platelet -     Magnesium -     VITAMIN D 25 Hydroxy (Vit-D Deficiency, Fractures)  Other iron deficiency anemia -     IBC + Ferritin -     Vitamin B12 -     VITAMIN D 25 Hydroxy (Vit-D Deficiency, Fractures)    Assessment and Plan Assessment & Plan Adult Wellness Visit   Routine wellness visit focused on maintaining a healthy lifestyle despite physical limitations from previous surgeries and conditions. Regular physical activity within limits and healthy eating habits were encouraged to manage weight and reduce inflammation. The importance of regular dental and dermatological check-ups was discussed.  Migraine   Chronic migraines with recent initiation of Botox  treatment have shown some improvement. Previous frequency was daily with severity of 7/10, localized above the right eyebrow. Continue Botox  treatment and follow up with a neurologist for migraine management.  Disorder of autonomic nervous system   Symptoms include dizziness and potential syncope. Midodrine  was recently prescribed to manage symptoms and may also help with brain fog. Continue midodrine  for autonomic dysfunction.  Iron deficiency anemia   A full iron panel was requested to assess current status. Ordered CBC, CMP, lipids, A1c, thyroid , magnesium, B12, iron, ferritin, and vitamin D tests.  Polycystic ovary syndrome with menstrual irregularity and dysmenorrhea, suspected endometriosis and adenomyosis   PCOS with irregular and painful periods, suspected endometriosis and adenomyosis. Current IUD (Mirena ) has not resolved symptoms. Periods are light, very painful, and dark brown, with recent continuous dark brown spotting since October 28th. Discussed potential need for different hormonal treatments or hysterectomy, considering her decision against biological children. Consult with GYN regarding persistent menstrual irregularities and potential treatment  options.  Recent weight gain attributed to poor dietary habits, including binge eating due to boredom and anxiety. Discussed the impact of diet on inflammation and overall health. Encouraged healthy eating habits, portion control, and strategies for meal preparation to improve dietary habits.  Depression  Managed with esketamine nasal spray (Spravato) and other medications. Mood is well-managed with current treatment. Discussed the role of therapy in managing mood and eating habits. Continue current psychiatric medications and therapy.     Recommended follow up: Return in about 1 year (around 03/31/2025) for annual physical.  Lab/Order associations:+ fasting   Jenkins CHRISTELLA Carrel, MD

## 2024-03-31 NOTE — Progress Notes (Signed)
 Labs ok except   B12 low end of normal, take otc B12 1000mcg daily Vitamin d slightly low-take 1000iu/day otc

## 2024-03-31 NOTE — Telephone Encounter (Signed)
Noted smk

## 2024-03-31 NOTE — Telephone Encounter (Signed)
 FYI Only or Action Required?: Action required by provider: update on patient condition.  Patient was last seen in primary care on 03/31/2024 by Wendolyn Jenkins Jansky, MD.  Called Nurse Triage reporting Hand Pain.  Symptoms began today.  Interventions attempted: Nothing.  Symptoms are: unchanged.  Triage Disposition: Home Care  Patient/caregiver understands and will follow disposition?: Yes       Copied from CRM [id number]. Topic: Clinical - Red Word Triage >> Mar 31, 2024  3:47 PM Frederich PARAS wrote: [facility] that prompted transfer to Nurse Triage: swollen, pain Right hand where the needle was        Reason for Disposition  Hand pain  Answer Assessment - Initial Assessment Questions Patient reports some bruising and a small bump where the blood was drawn today in the office. I advised that some swelling and bruising can be normal. Home care instructions were discussed and patient instructed to call back for new or worsening symptoms. Patient verbalized understanding and agreement with this plan.      1. ONSET: When did the pain start?     Today 2. LOCATION: Where is the pain located?     Right hand 3. PAIN: How bad is the pain? (Scale 1-10; or mild, moderate, severe)     Mild 4. WORK OR EXERCISE: Has there been any recent work or exercise that involved this part (i.e., hand or wrist) of the body?     No  5. CAUSE: What do you think is causing the pain?     Had blood drawn today 6. AGGRAVATING FACTORS: What makes the pain worse? (e.g., using computer)     Nothing  7. OTHER SYMPTOMS: Do you have any other symptoms? (e.g., fever, neck pain, numbness or tingling, rash, swelling)     Some swelling a bruising  Protocols used: Hand Pain-A-AH

## 2024-04-01 DIAGNOSIS — F332 Major depressive disorder, recurrent severe without psychotic features: Secondary | ICD-10-CM | POA: Diagnosis not present

## 2024-04-01 DIAGNOSIS — F4312 Post-traumatic stress disorder, chronic: Secondary | ICD-10-CM | POA: Diagnosis not present

## 2024-04-01 NOTE — Progress Notes (Signed)
 Pt has read labs smk

## 2024-04-05 DIAGNOSIS — F332 Major depressive disorder, recurrent severe without psychotic features: Secondary | ICD-10-CM | POA: Diagnosis not present

## 2024-04-07 DIAGNOSIS — F332 Major depressive disorder, recurrent severe without psychotic features: Secondary | ICD-10-CM | POA: Diagnosis not present

## 2024-04-08 ENCOUNTER — Other Ambulatory Visit: Payer: Self-pay | Admitting: Family Medicine

## 2024-04-08 ENCOUNTER — Encounter: Payer: Self-pay | Admitting: Family Medicine

## 2024-04-08 DIAGNOSIS — F332 Major depressive disorder, recurrent severe without psychotic features: Secondary | ICD-10-CM | POA: Diagnosis not present

## 2024-04-08 DIAGNOSIS — F411 Generalized anxiety disorder: Secondary | ICD-10-CM | POA: Diagnosis not present

## 2024-04-08 DIAGNOSIS — F4312 Post-traumatic stress disorder, chronic: Secondary | ICD-10-CM | POA: Diagnosis not present

## 2024-04-08 MED ORDER — AZITHROMYCIN 500 MG PO TABS: 500.0000 mg | ORAL_TABLET | Freq: Once | ORAL | 1 refills | Status: AC

## 2024-04-08 MED ORDER — FLUCONAZOLE 150 MG PO TABS: 150.0000 mg | ORAL_TABLET | ORAL | 0 refills | Status: DC | PRN

## 2024-04-08 MED ORDER — CEPHALEXIN 500 MG PO CAPS: 2000.0000 mg | ORAL_CAPSULE | Freq: Once | ORAL | 1 refills | Status: DC

## 2024-04-10 ENCOUNTER — Ambulatory Visit: Admitting: Physical Therapy

## 2024-04-10 ENCOUNTER — Encounter: Payer: Self-pay | Admitting: Physical Therapy

## 2024-04-10 DIAGNOSIS — M25551 Pain in right hip: Secondary | ICD-10-CM | POA: Diagnosis not present

## 2024-04-10 DIAGNOSIS — M357 Hypermobility syndrome: Secondary | ICD-10-CM | POA: Diagnosis not present

## 2024-04-10 DIAGNOSIS — Z96641 Presence of right artificial hip joint: Secondary | ICD-10-CM | POA: Diagnosis not present

## 2024-04-10 DIAGNOSIS — M6281 Muscle weakness (generalized): Secondary | ICD-10-CM

## 2024-04-10 NOTE — Therapy (Signed)
 OUTPATIENT PHYSICAL THERAPY LOWER EXTREMITY TREATMENT    Patient Name: Brianna [name] MRN: [id number] DOB:Jan 14, 1996, 28 y.o., female Today's Date: 03/03/2024  END OF SESSION:  PT End of Session - 04/10/24 0816     Visit Number 19    Number of Visits 23    Date for Recertification  04/18/24    Authorization Type BCBS    PT Start Time 0818    PT Stop Time 0845    PT Time Calculation (min) 27 min    Activity Tolerance Patient tolerated treatment well    Behavior During Therapy [facility] for tasks assessed/performed                Past Medical History:  Diagnosis Date   Acute recurrent pansinusitis 10/21/2019   ADHD (attention deficit hyperactivity disorder)    inattentive   Allergy    Anemia    had iron infusions in Fall of 2023   Anxiety    Arthritis    Joints, hips   Asthma    EIA   Bronchitis    Cat allergy due to both airborne and skin contact 05/01/2016   Chronic sore throat 05/01/2016   COVID 2020   Depression    Ehlers-Danlos syndrome    Fibromyalgia    pt denies   Gastroparesis    GERD (gastroesophageal reflux disease)    History of hypotension    taken midodrine  in the past- Spring of 2022   Hypermobile Ehlers-Danlos syndrome    Irregular heart rate    Keratosis pilaris    Migraine    w and w/o auras   PCOS (polycystic ovarian syndrome) 10/26/2022   PONV (postoperative nausea and vomiting)    I can get really cold   PTSD (post-traumatic stress disorder)    Seasonal allergic rhinitis 03/11/2014   Past Surgical History:  Procedure Laterality Date   CHOLECYSTECTOMY  05/09/2021   ESOPHAGOGASTRODUODENOSCOPY ENDOSCOPY     03/2020   HIP ARTHROPLASTY Right    HIP ARTHROSCOPY Right 04/25/2022   Procedure: RIGHT HIP ARTHROSCOPY WITH LABRAL RECONSTRUCTION, ILIOTIBIAL BAND ALLOGRAFT, PSOAS RELEASE;  Surgeon: Genelle Standing, MD;  Location: [facility] OR;  Service: Orthopedics;  Laterality: Right;   HIP SURGERY Right    per pt   JOINT REPLACEMENT      Upcoming THR right hip 09/03/23   LABRAL REPAIR Right 03/02/2021   Procedure: RIGHT HIP ARTHROSCOPY WITH LABRAL REPAIR AND PINCE DEBRIDEMENT;  Surgeon: Genelle Standing, MD;  Location: [facility] OR;  Service: Orthopedics;  Laterality: Right;   SMART PILL PROCEDURE     12/2020   TYMPANOSTOMY TUBE PLACEMENT Bilateral    WISDOM TOOTH EXTRACTION     Patient Active Problem List   Diagnosis Date Noted   Chronic night sweats 08/21/2023   PCOS (polycystic ovarian syndrome) 10/26/2022   Cervical adenopathy 06/10/2021   Chronic pruritus 06/10/2021   Exercise-induced asthma 06/09/2021   Chronic venous insufficiency 06/03/2021   Orthostatic lightheadedness 06/03/2021   Purple toe syndrome of both feet (HCC) 06/03/2021   Autonomic dysfunction 06/03/2021   Tear of right acetabular labrum 04/13/2021   Migraine headache 03/01/2021   Headache 11/30/2020   Positive ANA (antinuclear antibody) 11/15/2020   Fibromyalgia 11/02/2020   Hair loss 11/02/2020   Muscle spasticity 10/22/2020   Hypermobile Ehlers-Danlos syndrome 10/22/2020   Paresthesia of bilateral legs 10/06/2020   Myalgia 10/06/2020   Chronic pelvic pain in female 10/06/2020   Chronic abdominal pain 10/06/2020   Irritable colon 08/02/2020   Pelvic floor dysfunction 08/02/2020  Irritant dermatitis 08/02/2020   Gastroesophageal reflux disease 06/28/2020   Severe recurrent major depression without psychotic features (HCC) 06/28/2020   Polyarthralgia 06/28/2020   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 06/28/2020   Tachycardia 06/28/2020   History of COVID-19 06/28/2020   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 deficit 06/28/2020   Recurrent major depressive disorder, in full remission 06/28/2020   Atypical chest pain 06/22/2020   Dizziness 06/22/2020   Palpitations 06/14/2020   Anxiety 05/04/2020   Internal and external hemorrhoids without complication 04/29/2020   Nondiabetic gastroparesis 03/03/2020   ETD  (Eustachian tube dysfunction), bilateral 10/21/2019   Acne vulgaris 03/11/2014   Mild intermittent asthma without complication 03/11/2014   Seasonal allergies 03/11/2014    PCP: Wendolyn Jenkins Jansky MD  REFERRING PROVIDER: Ezzard Redell Ezzard MD  REFERRING DIAG: [phone] (ICD-10-CM) - 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  I have had nerve zaps and pins and needles in my mid back.  Neck pain is worse than hip pain.  They wrote me a referral for my neck at the [facility]. Pt reports she has not been able to get an appt that works with her schedule/work.     Patient presents physical therapy evaluation status post right anterior hip replacement.  She has a history of 2 labral repairs in the past but after multiple surgical consultations it was found that her hip dysplasia was causing osteoarthritis and significant joint degeneration.  This is a sensitive topic for her because she has been dealing with this problem for several years and wonders how no one found hip dysplasia on her x-ray.  She opted for total hip replacement and overall is pleased with her progress.  She had home health physical therapy for 2 weeks following discharge from hospital.  Overall she has been keeping up with her exercises.  She walks with 1 crutch without incident but cannot limp the only thing I have difficulty with is putting on my socks.  She does have some issues with pain in her proximal anterior thigh and hip.  She has some numbness and tingling  around the site.  She is planning to move back into her home this week which is on the first level.  She has not seen the surgeon yet but has an appointment in a few weeks.  She was unclear about any precautions other than what her home health therapist gave her.   PERTINENT HISTORY: Ehlers-Danlos syndrome, symptoms of dysautonomia including palpitations dizziness and heat intolerance  chronic joint and muscle pain  MD note:  Postoperative hip pain - Persistent left hip pain since total hip arthroplasty performed three months ago - Pain rated at 6 out of 10 in intensity - New onset of different pain and popping sensation in the hip while getting into her car - Pain was most intense during the first three days after the incident and has since remained consistent - Pain flares with activities such as walking, driving, and physical therapy - Pain is aggravated by movements that activate the hip flexor - Unable to perform hip flexor activities without  significant discomfort - Recent return to work has increased walking and subsequently worsened pain - Concern for developing gait abnormalities due to pain and hip weakness  Pain management and medication intolerance - Uses ice for pain management - Unable to tolerate NSAIDs due to history of adverse effects and gastroparesis - Not taking any medication for pain    PAIN:  Are you having pain? Yes: NPRS scale: 4-5/10  Pain location: Rt antlat. hip  Pain description: throbbing, achy  Aggravating factors: standing, walking  Relieving factors: Changing positions but something that helps the most is an ice pack which she uses frequently  PRECAUTIONS: Anterior hip patient would be wise to follow due to collagen defect due to hypermobile Ehlers-Danlos syndrome  RED FLAGS: None   WEIGHT BEARING RESTRICTIONS: No  FALLS:  Has patient fallen in last 6 months? No  LIVING ENVIRONMENT: Lives with: lives alone Lives in: House/apartment Stairs:  No Has following equipment at home: Crutches shower chair   OCCUPATION: [employer] out until 8/11 .   PLOF: Independent  PATIENT GOALS: I want to be walk pain free   NEXT MD VISIT:   OBJECTIVE:  Note: Objective measures were completed at Evaluation unless otherwise noted.  DIAGNOSTIC FINDINGS: see chart ([facility])   PATIENT SURVEYS:   COGNITION: Overall cognitive status: Within functional limits for tasks assessed     SENSATION: Patient reports numbness and tingling near incision site and along the lateral thigh  EDEMA:  Minimal surrounding incision Patient points out a apparent deformity on the right lateral hip just distal to the greater trochanter.  She reports that was present before her total hip replacement but is questioning what it is it did feel a bit puffy to me but it could also be gluteus atrophy which makes it look a little bit more pronounced  POSTURE: No Significant postural limitations  PALPATION: Tender to palpation along  well-healed incision  LOWER EXTREMITY MNF:[facility] hip ER/IR   Active ROM Right eval Left eval Rt 01/07/24 Rt. 02/22/24  Hip flexion P90 P 120 115 Pain 120 pinch   Hip extension      Hip abduction      Hip adduction      Hip internal rotation A24,P25 A35, P30 27 Pain  20  Hip external rotation A3, P10 A30, P50  47 Pain  45  Knee flexion      Knee extension      Ankle dorsiflexion      Ankle plantarflexion      Ankle inversion      Ankle eversion       (Blank rows = not tested)  LOWER EXTREMITY MMT: 02/22/24: LLE 5/5  MMT Right eval Left eval Rt  01/07/24 Rt. 02/22/24  Hip flexion 4 5 NT  (3-/5?) 3-/5   Hip extension   4/5 5/5  Hip abduction      Hip adduction   4/5   Hip internal rotation 4 4  4+  Hip external rotation 3+ 4  4  Knee flexion 4+ 5  5  Knee extension 5 5  5   Ankle dorsiflexion    5  Ankle plantarflexion      Ankle inversion      Ankle eversion       (Blank rows = not tested)    FUNCTIONAL TESTS:  5  times sit to stand: 16 seconds 30 seconds chair stand test, 9 repetitions  GAIT: Distance walked: 100 feet Assistive device utilized: Crutches x1 Level of assistance:  Complete Independence Comments: Patient without appreciable limp for the first 75 feet, slower more cautious pace                                                                                                                                TREATMENT DATE:   [facility] Adult PT Treatment:                                                DATE: 04/10/24 Therapeutic Exercise: Piriformis stretch- figure 4  Bridging  Single leg bridge Reverse step up Rt LE Hip extension Rt  Hip abduction Rt  Supine SLR Sidelying SLR hip abd and side kicks   Manual Therapy: Prone hip PROM, IR and Er with compression to glutes, piriformis  Self Care: Offered tools for pain relief- MHP, massage tools, massage therapist, movement vs passive modalities during exercise and manual   [facility] Adult PT Treatment:                                                DATE: 03/25/24 Manual Therapy: IASTM Rt hip, glute/ITB Soft tissue mobilization TFL, glute med  Therapeutic Activity: SLR  SLR adductor  IR in sidelying ball between knees  Hip abduction  Sidekick flex/ext  Sideplank forearm with clam x 10  Reverse step down/hinge  BOSU forward lunge alt.  Added upper trunk rotation  Partial squat on BOSU (flat side up)  Head turns  Hip flexor stretch each side    [facility] Adult PT Treatment:                                                DATE: 03/13/24 Therapeutic Exercise: Deferred Bike today  3 way hamstring  Bridging  Single leg bridge  Sidelying hip abduction and side circles  Closed chain exercise:  15 lbs squat, RDL x 15 each  Bulgarian split no wgt each side   SLS with hip abduction , extension  Moved to reformer for standing right leg extension and lunging, scooter 1 red spring  Manual Therapy: Prone light joint mobilization to the right lower  extremity Rt IASTM Rt thigh, quads, TFL    03/03/24: Therapeutic Exercise: Aerobic: Supine: bridging 2 x 10;  Seated: S/L:  hip abd 2 x 10 on R;  Standing:  Band walks GTB x2 (mild dizziness)  Stretches:  Neuromuscular Re-education: Manual Therapy: IASTM to anterior hip musculature, tfl , rec fem and vastus lateralis  Therapeutic Activity:   Step ups with opp knee drive,  no UE support  2 x 10 bil;  Lateral step ups x 10 bil;  Self Care: Trigger Point Dry Needling  Initial Treatment: Pt instructed on Dry Needling rational, procedures, and possible side effects. Pt instructed to expect mild to moderate muscle soreness later in the day and/or into the next day.  Pt instructed in methods to reduce muscle soreness. Pt instructed to continue prescribed HEP. Patient was educated on signs and symptoms of infection and other risk factors and advised to seek medical attention should they occur.  Patient verbalized understanding of these instructions and education.   Patient Verbal Consent Given: Yes Education Handout Provided: Yes Muscles Treated: R TFL, R vastus lateralis, R piriformis Electrical Stimulation Performed: No Treatment Response/Outcome: palpable increase in muscle length     [facility] Adult PT Treatment:                                                DATE: 02/22/24 Re-Eval  2 min walk 417 feet , pain increased to 6.5/10  Hip flexor Rt LE off table Review HEP, POC and dry needling options , safety.    [facility] Adult PT Treatment:                                                DATE: 01/09/24 Therapeutic Exercise: Rt hip flexor stretch off edge of table IASTM to Rt ant thigh in prep for work  Stage Manager with ball  x 10 (no pain)  Bridge with HS curl  combo x 10  SLR leg on ball (Rt) x 10  Step up with opp leg lift 6 inch x 2 x 10  Sumo Squat 10 lbs x 10 , pain with full extension  Narrow parallel squat x 10 , 10 lbs  RDL 10 lbs  x 10   Self Care: Plan of  care MRI vs msk US       PATIENT EDUCATION:  Education details: hip precautions, POC, HEP  Person educated: Patient Education method: Programmer, Multimedia, Demonstration, Verbal cues, and Handouts Education comprehension: verbalized understanding, returned demonstration, and needs further education  HOME EXERCISE PROGRAM: Access Code: [email or web address] URL: [email or web address] Date: 04/10/2024 Prepared by: Delon Norma  Exercises - Supine Bridge  - 1 x daily - 7 x weekly - 2 sets - 10 reps - 5 hold - Figure 4 Bridge  - 1 x daily - 7 x weekly - 2 sets - 10 reps - Standing Hip Abduction with Counter Support  - 1 x daily - 7 x weekly - 2 sets - 10 reps - 5 hold - Reverse Lunge  - 1 x daily - 7 x weekly - 2 sets - 10 reps - 5 hold - Seated Figure 4 Piriformis Stretch  - 1 x daily - 7 x weekly - 1 sets - 3 reps - 30 hold - Side Stepping with Resistance at Thighs  - 1 x daily - 7 x weekly - 2 sets - 10 reps - Marching with Resistance  - 1 x daily - 7 x weekly - 2 sets - 10 reps - 5 hold - Clamshell with Resistance  - 1 x daily - 7 x weekly - 2 sets - 10 reps ASSESSMENT:  CLINICAL IMPRESSION: Brianna [name]  has overall made good functional progress with her strength and mobility.  Today she reported increased levels of pain for the past couple of weeks in her right hip in addition to her right mid back and neck.  She has had difficulty getting appointments that fit her schedule so it has been a couple of weeks since she was here last time.  She reports she has a referral for her neck over at the aquatic therapy location at [facility].  We touched on a little bit of post discharge plans including a gym membership that is affordable.  She is not sure why aquatic therapy would be indicated for her neck.  Aneli can tolerate all exercises today straight leg raises good but with increased anterior hip tension understandably.  Stretching gives her only brief relief of symptoms.  We reviewed her home exercise  program and updated the exercises to include more standing and strengthening which is what she definitely needs long-term.  She knows to expect a level of discomfort and pain due to her underlying hypermobility syndrome/EDS, but she does need tools to manage her pain as medicine has been unsuccessful so far.  She will be seen 1 more visit then decide about continued PT here vs [facility] vs [facility].     OBJECTIVE IMPAIRMENTS: decreased activity tolerance, decreased endurance, decreased knowledge of use of DME, decreased mobility, difficulty walking, decreased ROM, decreased strength, increased edema, increased fascial restrictions, increased muscle spasms, impaired sensation, and pain.   ACTIVITY LIMITATIONS: carrying, lifting, bending, sitting, standing, squatting, sleeping, transfers, bed mobility, dressing, and locomotion level  PARTICIPATION LIMITATIONS: cleaning, driving, shopping, community activity, and occupation  PERSONAL FACTORS: Past/current experiences, Transportation, and 1-2 comorbidities: EDS< previous hip surgeries are also affecting patient's functional outcome.   REHAB POTENTIAL: Excellent  CLINICAL DECISION MAKING: Evolving/moderate complexity  EVALUATION COMPLEXITY: Moderate   GOALS: Goals reviewed with patient? Yes  SHORT TERM GOALS: Target date: 12/18/2023   Patient will be able to show independence for initial HEP to include posture, core and hip strength and stability.   Baseline: Goal status: MET   2.  Patient will be I with concepts of joint protection, hip precautions and stability as it pertains to joint hypermobility. Baseline:  Goal status:MET   3.  Patient will be able to ambulate in the clinic without the use of crutch and no discernible limp 300 feet  Baseline:  Goal status: MET   LONG TERM GOALS: Target date: 01/15/2024    LEFS score will increase by 16 points indicating improved functional mobility Baseline: 19/80, 02/22/24: 30/80 Goal status: ongoing    2.  Patient will be independent with home exercise program upon discharge from physical therapy Baseline:  Goal status:ongoing   3.  Patient will be able to walk without assistive device and pain no more than 2/10 In the right hip community distances > 1000 feet  Baseline:  Goal status: ongoing, pain > 2/10 but can do this distance   4.  Patient will demonstrate a pain free squat in order to complete transfers repetitively and obtain items below the waist for work and home tasks Baseline:  Goal status: ongoing , can do a loaded box squat but has pinching with full deep squat to the floor  5.  Patient will be able to demonstrate hip/knee/ankle strength to 5/5 in order to maximize functional mobility, ambulation and lifting.   Baseline:  Goal status: ongoing    6. Patient will be able to show dynamic balance recovery in  single leg exercises    Baseline:   Goal status: NEW  PLAN:  PT FREQUENCY: 2x/week  PT DURATION: 8 weeks  PLANNED INTERVENTIONS: 97164- PT Re-evaluation, 97750- Physical Performance Testing, 97110-Therapeutic exercises, 97530- Therapeutic activity, [id number]- Neuromuscular re-education, 97535- Self Care, [id number]- Manual therapy, [phone]- Gait training, [phone]- Aquatic Therapy, [phone]- Electrical stimulation (unattended), Patient/Family education, Balance training, Stair training, Taping, DME instructions, Cryotherapy, and Moist heat  PLAN FOR NEXT SESSION: Focus on closed chain exercises consider trigger point dry needling to the right TFL and glutes in order to reduce symptoms.manual . Pilates?  Delon Norma, PT 04/10/24 8:52 AM Phone: [phone] Fax: [phone]

## 2024-04-11 ENCOUNTER — Ambulatory Visit (INDEPENDENT_AMBULATORY_CARE_PROVIDER_SITE_OTHER)

## 2024-04-11 ENCOUNTER — Telehealth: Payer: Self-pay

## 2024-04-11 ENCOUNTER — Encounter: Payer: Self-pay | Admitting: Physical Therapy

## 2024-04-11 ENCOUNTER — Other Ambulatory Visit (HOSPITAL_COMMUNITY)
Admission: RE | Admit: 2024-04-11 | Discharge: 2024-04-11 | Disposition: A | Source: Ambulatory Visit | Attending: Obstetrics and Gynecology | Admitting: Obstetrics and Gynecology

## 2024-04-11 ENCOUNTER — Ambulatory Visit

## 2024-04-11 VITALS — BP 126/75 | HR 84 | Ht 64.0 in | Wt 139.0 lb

## 2024-04-11 DIAGNOSIS — Z113 Encounter for screening for infections with a predominantly sexual mode of transmission: Secondary | ICD-10-CM | POA: Insufficient documentation

## 2024-04-11 DIAGNOSIS — N898 Other specified noninflammatory disorders of vagina: Secondary | ICD-10-CM | POA: Insufficient documentation

## 2024-04-11 DIAGNOSIS — R300 Dysuria: Secondary | ICD-10-CM

## 2024-04-11 LAB — POCT URINALYSIS DIPSTICK
Bilirubin, UA: NEGATIVE
Blood, UA: NEGATIVE
Glucose, UA: NEGATIVE
Ketones, UA: NEGATIVE
Leukocytes, UA: NEGATIVE
Nitrite, UA: NEGATIVE
Protein, UA: NEGATIVE
Spec Grav, UA: 1.01 (ref 1.010–1.025)
Urobilinogen, UA: 0.2 U/dL
pH, UA: 7.5 (ref 5.0–8.0)

## 2024-04-11 NOTE — Progress Notes (Signed)
 SUBJECTIVE:  28 y.o. female complains of thick vaginal yellow discharge for 3 day(s) and dysuria, urinary urgency, and urinary frequency Pt reported abnormal vaginal bleeding and significant pelvic pain, denied fever. Denies history of known exposure to STD. Pt reported has scheduled appointment with provider regarding irregular bleeding and pelvic pain.  Patient's last menstrual period was 03/21/2024 (exact date). Pt reported also started bleeding 03/24/24.  OBJECTIVE:  She appears alert, well appearing, in no apparent distress Urine dipstick: negative for all components.  ASSESSMENT:  Vaginal Discharge  Vaginal Odor Dysuria    PLAN:  GC, chlamydia, trichomonas, BVAG, CVAG probe and urine culture sent to lab. Treatment: To be determined once lab results are received ROV prn if symptoms persist or worsen.   Silvano LELON Piano, RN

## 2024-04-11 NOTE — Telephone Encounter (Signed)
 Pt called into office reported having urinary frequency, odor and vaginal discharge x 3-4 days, unable to arrive by appointment time due to stuck in traffic, will plan to arrive before 12pm.  Silvano LELON Piano, RN

## 2024-04-13 LAB — URINE CULTURE: Organism ID, Bacteria: NO GROWTH

## 2024-04-14 DIAGNOSIS — F332 Major depressive disorder, recurrent severe without psychotic features: Secondary | ICD-10-CM | POA: Diagnosis not present

## 2024-04-15 ENCOUNTER — Ambulatory Visit (INDEPENDENT_AMBULATORY_CARE_PROVIDER_SITE_OTHER): Admitting: Physical Therapy

## 2024-04-15 DIAGNOSIS — F332 Major depressive disorder, recurrent severe without psychotic features: Secondary | ICD-10-CM | POA: Diagnosis not present

## 2024-04-15 DIAGNOSIS — M25551 Pain in right hip: Secondary | ICD-10-CM | POA: Diagnosis not present

## 2024-04-15 DIAGNOSIS — M357 Hypermobility syndrome: Secondary | ICD-10-CM | POA: Diagnosis not present

## 2024-04-15 DIAGNOSIS — M6281 Muscle weakness (generalized): Secondary | ICD-10-CM | POA: Diagnosis not present

## 2024-04-15 DIAGNOSIS — F4312 Post-traumatic stress disorder, chronic: Secondary | ICD-10-CM | POA: Diagnosis not present

## 2024-04-15 LAB — CERVICOVAGINAL ANCILLARY ONLY
Bacterial Vaginitis (gardnerella): NEGATIVE
Candida Glabrata: NEGATIVE
Candida Vaginitis: NEGATIVE
Chlamydia: NEGATIVE
Comment: NEGATIVE
Comment: NEGATIVE
Comment: NEGATIVE
Comment: NEGATIVE
Comment: NEGATIVE
Comment: NORMAL
Neisseria Gonorrhea: NEGATIVE
Trichomonas: NEGATIVE

## 2024-04-15 NOTE — Therapy (Signed)
 OUTPATIENT PHYSICAL THERAPY LOWER EXTREMITY TREATMENT    Patient Name: Brianna [name] MRN: [id number] DOB:Sep 04, 1995, 28 y.o., female Today's Date: 03/03/2024  END OF SESSION:  PT End of Session - 04/15/24 1616     Visit Number 20    Number of Visits 23    Date for Recertification  04/18/24    Authorization Type [employer]    PT Start Time 1614 late arrival    PT Stop Time 1645    PT Time Calculation (min) 31 min    Activity Tolerance Patient tolerated treatment well    Behavior During Therapy [facility] for tasks assessed/performed            Past Medical History:  Diagnosis Date   Acute recurrent pansinusitis 10/21/2019   ADHD (attention deficit hyperactivity disorder)    inattentive   Allergy    Anemia    had iron infusions in Fall of 2023   Anxiety    Arthritis    Joints, hips   Asthma    EIA   Bronchitis    Cat allergy due to both airborne and skin contact 05/01/2016   Chronic sore throat 05/01/2016   COVID 2020   Depression    Ehlers-Danlos syndrome    Fibromyalgia    pt denies   Gastroparesis    GERD (gastroesophageal reflux disease)    History of hypotension    taken midodrine  in the past- Spring of 2022   Hypermobile Ehlers-Danlos syndrome    Irregular heart rate    Keratosis pilaris    Migraine    w and w/o auras   PCOS (polycystic ovarian syndrome) 10/26/2022   PONV (postoperative nausea and vomiting)    I can get really cold   PTSD (post-traumatic stress disorder)    Seasonal allergic rhinitis 03/11/2014   Past Surgical History:  Procedure Laterality Date   CHOLECYSTECTOMY  05/09/2021   ESOPHAGOGASTRODUODENOSCOPY ENDOSCOPY     03/2020   HIP ARTHROPLASTY Right    HIP ARTHROSCOPY Right 04/25/2022   Procedure: RIGHT HIP ARTHROSCOPY WITH LABRAL RECONSTRUCTION, ILIOTIBIAL BAND ALLOGRAFT, PSOAS RELEASE;  Surgeon: Genelle Standing, MD;  Location: [facility] OR;  Service: Orthopedics;  Laterality: Right;   HIP SURGERY Right    per pt   JOINT REPLACEMENT      Upcoming THR right hip 09/03/23   LABRAL REPAIR Right 03/02/2021   Procedure: RIGHT HIP ARTHROSCOPY WITH LABRAL REPAIR AND PINCE DEBRIDEMENT;  Surgeon: Genelle Standing, MD;  Location: [facility] OR;  Service: Orthopedics;  Laterality: Right;   SMART PILL PROCEDURE     12/2020   TYMPANOSTOMY TUBE PLACEMENT Bilateral    WISDOM TOOTH EXTRACTION     Patient Active Problem List   Diagnosis Date Noted   Chronic night sweats 08/21/2023   PCOS (polycystic ovarian syndrome) 10/26/2022   Cervical adenopathy 06/10/2021   Chronic pruritus 06/10/2021   Exercise-induced asthma 06/09/2021   Chronic venous insufficiency 06/03/2021   Orthostatic lightheadedness 06/03/2021   Purple toe syndrome of both feet (HCC) 06/03/2021   Autonomic dysfunction 06/03/2021   Tear of right acetabular labrum 04/13/2021   Migraine headache 03/01/2021   Headache 11/30/2020   Positive ANA (antinuclear antibody) 11/15/2020   Fibromyalgia 11/02/2020   Hair loss 11/02/2020   Muscle spasticity 10/22/2020   Hypermobile Ehlers-Danlos syndrome 10/22/2020   Paresthesia of bilateral legs 10/06/2020   Myalgia 10/06/2020   Chronic pelvic pain in female 10/06/2020   Chronic abdominal pain 10/06/2020   Irritable colon 08/02/2020   Pelvic floor dysfunction 08/02/2020  Irritant dermatitis 08/02/2020   Gastroesophageal reflux disease 06/28/2020   Severe recurrent major depression without psychotic features (HCC) 06/28/2020   Polyarthralgia 06/28/2020   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 06/28/2020   Tachycardia 06/28/2020   History of COVID-19 06/28/2020   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 deficit 06/28/2020   Recurrent major depressive disorder, in full remission 06/28/2020   Atypical chest pain 06/22/2020   Dizziness 06/22/2020   Palpitations 06/14/2020   Anxiety 05/04/2020   Internal and external hemorrhoids without complication 04/29/2020   Nondiabetic gastroparesis 03/03/2020   ETD  (Eustachian tube dysfunction), bilateral 10/21/2019   Acne vulgaris 03/11/2014   Mild intermittent asthma without complication 03/11/2014   Seasonal allergies 03/11/2014    PCP: Wendolyn Jenkins Jansky MD  REFERRING PROVIDER: Ezzard Redell Ezzard MD  REFERRING DIAG: [id number] (ICD-10-CM) - 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  Trying to get in touch with massage therapist.     Patient presents physical therapy evaluation status post right anterior hip replacement.  She has a history of 2 labral repairs in the past but after multiple surgical consultations it was found that her hip dysplasia was causing osteoarthritis and significant joint degeneration.  This is a sensitive topic for her because she has been dealing with this problem for several years and wonders how no one found hip dysplasia on her x-ray.  She opted for total hip replacement and overall is pleased with her progress.  She had home health physical therapy for 2 weeks following discharge from hospital.  Overall she has been keeping up with her exercises.  She walks with 1 crutch without incident but cannot limp the only thing I have difficulty with is putting on my socks.  She does have some issues with pain in her proximal anterior thigh and hip.  She has some numbness and tingling around the site.  She is planning to move back into her home this week which is on the first level.  She has not seen the surgeon yet but has an appointment in a few weeks.  She was unclear about any precautions other than what her home health  therapist gave her.   PERTINENT HISTORY: Ehlers-Danlos syndrome, symptoms of dysautonomia including palpitations dizziness and heat intolerance  chronic joint and muscle pain  MD note:  Postoperative hip pain - Persistent left hip pain since total hip arthroplasty performed three months ago - Pain rated at 6 out of 10 in intensity - New onset of different pain and popping sensation in the hip while getting into her car - Pain was most intense during the first three days after the incident and has since remained consistent - Pain flares with activities such as walking, driving, and physical therapy - Pain is aggravated by movements that activate the hip flexor - Unable to perform hip flexor activities without significant discomfort - Recent return to work has increased walking and subsequently worsened pain - Concern for developing gait abnormalities due to pain and hip weakness  Pain management and medication intolerance - Uses ice for pain management - Unable to tolerate NSAIDs due to history of adverse effects and gastroparesis -  Not taking any medication for pain    PAIN:  Are you having pain? Yes: NPRS scale: 2-3/10  Pain location: Rt antlat. hip  Pain description: throbbing, achy  Aggravating factors: standing, walking  Relieving factors: Changing positions but something that helps the most is an ice pack which she uses frequently  PRECAUTIONS: Anterior hip patient would be wise to follow due to collagen defect due to hypermobile Ehlers-Danlos syndrome  RED FLAGS: None   WEIGHT BEARING RESTRICTIONS: No  FALLS:  Has patient fallen in last 6 months? No  LIVING ENVIRONMENT: Lives with: lives alone Lives in: House/apartment Stairs: No Has following equipment at home: Crutches shower chair   OCCUPATION: [employer] out until 8/11 .   PLOF: Independent  PATIENT GOALS: I want to be walk pain free   NEXT MD VISIT:   OBJECTIVE:  Note: Objective measures were  completed at Evaluation unless otherwise noted.  DIAGNOSTIC FINDINGS: see chart ([facility])   PATIENT SURVEYS:   COGNITION: Overall cognitive status: Within functional limits for tasks assessed     SENSATION: Patient reports numbness and tingling near incision site and along the lateral thigh  EDEMA:  Minimal surrounding incision Patient points out a apparent deformity on the right lateral hip just distal to the greater trochanter.  She reports that was present before her total hip replacement but is questioning what it is it did feel a bit puffy to me but it could also be gluteus atrophy which makes it look a little bit more pronounced  POSTURE: No Significant postural limitations  PALPATION: Tender to palpation along  well-healed incision  LOWER EXTREMITY MNF:[facility] hip ER/IR   Active ROM Right eval Left eval Rt 01/07/24 Rt. 02/22/24  Hip flexion P90 P 120 115 Pain 120 pinch   Hip extension      Hip abduction      Hip adduction      Hip internal rotation A24,P25 A35, P30 27 Pain  20  Hip external rotation A3, P10 A30, P50  47 Pain  45  Knee flexion      Knee extension      Ankle dorsiflexion      Ankle plantarflexion      Ankle inversion      Ankle eversion       (Blank rows = not tested)  LOWER EXTREMITY MMT: 02/22/24: LLE 5/5  MMT Right eval Left eval Rt  01/07/24 Rt. 02/22/24  Hip flexion 4 5 NT  (3-/5?) 3-/5   Hip extension   4/5 5/5  Hip abduction      Hip adduction   4/5   Hip internal rotation 4 4  4+  Hip external rotation 3+ 4  4  Knee flexion 4+ 5  5  Knee extension 5 5  5   Ankle dorsiflexion    5  Ankle plantarflexion      Ankle inversion      Ankle eversion       (Blank rows = not tested)    FUNCTIONAL TESTS:  5 times sit to stand: 16 seconds 30 seconds chair stand test, 9 repetitions  GAIT: Distance walked: 100 feet Assistive device utilized: Crutches x1 Level of assistance: Complete Independence Comments: Patient without appreciable limp  for the first 75 feet, slower more cautious pace  TREATMENT DATE:    [facility] Adult PT Treatment:                                                DATE: 04/15/24 Therapeutic Exercise/Activity: Sidelying CARS for hip mobility Clam  Hip abd  Red looped band x 10 flexion  March with bridge  Walking lunge (BW) Lateral walking 10 lbs goblet  Diagonal stepping  Multidirectional hopping Springboard jumping 1 red 1 blue double leg wide narrow Single leg 1 red 1 blue  Sidelying jumping 1 red 1 yellow     [facility] Adult PT Treatment:                                                DATE: 04/10/24 Therapeutic Exercise: Piriformis stretch- figure 4  Bridging  Single leg bridge Reverse step up Rt LE Hip extension Rt  Hip abduction Rt  Supine SLR Sidelying SLR hip abd and side kicks   Manual Therapy: Prone hip PROM, IR and Er with compression to glutes, piriformis  Self Care: Offered tools for pain relief- MHP, massage tools, massage therapist, movement vs passive modalities during exercise and manual   [facility] Adult PT Treatment:                                                DATE: 03/25/24 Manual Therapy: IASTM Rt hip, glute/ITB Soft tissue mobilization TFL, glute med  Therapeutic Activity: SLR  SLR adductor  IR in sidelying ball between knees  Hip abduction  Sidekick flex/ext  Sideplank forearm with clam x 10  Reverse step down/hinge  BOSU forward lunge alt.  Added upper trunk rotation  Partial squat on BOSU (flat side up)  Head turns  Hip flexor stretch each side     PATIENT EDUCATION:  Education details: hip precautions, POC, HEP  Person educated: Patient Education method: Programmer, Multimedia, Demonstration, Verbal cues, and Handouts Education comprehension: verbalized understanding, returned demonstration, and needs further education  HOME EXERCISE  PROGRAM: Access Code: [email or web address] URL: [email or web address] Date: 04/10/2024 Prepared by: Delon Norma  Exercises - Supine Bridge  - 1 x daily - 7 x weekly - 2 sets - 10 reps - 5 hold - Figure 4 Bridge  - 1 x daily - 7 x weekly - 2 sets - 10 reps - Standing Hip Abduction with Counter Support  - 1 x daily - 7 x weekly - 2 sets - 10 reps - 5 hold - Reverse Lunge  - 1 x daily - 7 x weekly - 2 sets - 10 reps - 5 hold - Seated Figure 4 Piriformis Stretch  - 1 x daily - 7 x weekly - 1 sets - 3 reps - 30 hold - Side Stepping with Resistance at Thighs  - 1 x daily - 7 x weekly - 2 sets - 10 reps - Marching with Resistance  - 1 x daily - 7 x weekly - 2 sets - 10 reps - 5 hold - Clamshell with Resistance  - 1 x daily - 7 x weekly - 2 sets - 10 reps   ASSESSMENT:  CLINICAL IMPRESSION: Pt arrived late.  We worked on more advanced exercises including agility (light) and jumpboard on metallurgist. She had some foot cramping even after offering tennis ball to release plantar fascia and stretching over the footbar. Cont POC- will assess DC vs renew vs aquatics next visit due to late arrival.     OBJECTIVE IMPAIRMENTS: decreased activity tolerance, decreased endurance, decreased knowledge of use of DME, decreased mobility, difficulty walking, decreased ROM, decreased strength, increased edema, increased fascial restrictions, increased muscle spasms, impaired sensation, and pain.   ACTIVITY LIMITATIONS: carrying, lifting, bending, sitting, standing, squatting, sleeping, transfers, bed mobility, dressing, and locomotion level  PARTICIPATION LIMITATIONS: cleaning, driving, shopping, community activity, and occupation  PERSONAL FACTORS: Past/current experiences, Transportation, and 1-2 comorbidities: EDS< previous hip surgeries are also affecting patient's functional outcome.   REHAB POTENTIAL: Excellent  CLINICAL DECISION MAKING: Evolving/moderate complexity  EVALUATION COMPLEXITY:  Moderate   GOALS: Goals reviewed with patient? Yes  SHORT TERM GOALS: Target date: 12/18/2023   Patient will be able to show independence for initial HEP to include posture, core and hip strength and stability.   Baseline: Goal status: MET   2.  Patient will be I with concepts of joint protection, hip precautions and stability as it pertains to joint hypermobility. Baseline:  Goal status:MET   3.  Patient will be able to ambulate in the clinic without the use of crutch and no discernible limp 300 feet  Baseline:  Goal status: MET   LONG TERM GOALS: Target date: 01/15/2024    LEFS score will increase by 16 points indicating improved functional mobility Baseline: 19/80, 02/22/24: 30/80 Goal status: ongoing   2.  Patient will be independent with home exercise program upon discharge from physical therapy Baseline:  Goal status:ongoing   3.  Patient will be able to walk without assistive device and pain no more than 2/10 In the right hip community distances > 1000 feet  Baseline:  Goal status: ongoing, pain > 2/10 but can do this distance   4.  Patient will demonstrate a pain free squat in order to complete transfers repetitively and obtain items below the waist for work and home tasks Baseline:  Goal status: ongoing , can do a loaded box squat but has pinching with full deep squat to the floor  5.  Patient will be able to demonstrate hip/knee/ankle strength to 5/5 in order to maximize functional mobility, ambulation and lifting.   Baseline:  Goal status: ongoing    6. Patient will be able to show dynamic balance recovery in single leg exercises    Baseline:   Goal status: NEW  PLAN:  PT FREQUENCY: 2x/week  PT DURATION: 8 weeks  PLANNED INTERVENTIONS: 97164- PT Re-evaluation, 97750- Physical Performance Testing, 97110-Therapeutic exercises, 97530- Therapeutic activity, [id number]- Neuromuscular re-education, 97535- Self Care, [id number]- Manual therapy, [id number]- Gait training, [phone]-  Aquatic Therapy, [phone]- Electrical stimulation (unattended), Patient/Family education, Balance training, Stair training, Taping, DME instructions, Cryotherapy, and Moist heat  PLAN FOR NEXT SESSION: Focus on closed chain exercises consider trigger point dry needling to the right TFL and glutes in order to reduce symptoms.manual . Pilates?  Delon Norma, PT 04/15/24 4:45 PM Phone: [phone] Fax: [phone]

## 2024-04-21 DIAGNOSIS — F332 Major depressive disorder, recurrent severe without psychotic features: Secondary | ICD-10-CM | POA: Diagnosis not present

## 2024-04-22 ENCOUNTER — Ambulatory Visit: Payer: Self-pay | Admitting: Physical Therapy

## 2024-04-22 ENCOUNTER — Encounter: Payer: Self-pay | Admitting: Physical Therapy

## 2024-04-22 DIAGNOSIS — F332 Major depressive disorder, recurrent severe without psychotic features: Secondary | ICD-10-CM | POA: Diagnosis not present

## 2024-04-22 DIAGNOSIS — M357 Hypermobility syndrome: Secondary | ICD-10-CM | POA: Diagnosis not present

## 2024-04-22 DIAGNOSIS — M25551 Pain in right hip: Secondary | ICD-10-CM | POA: Diagnosis not present

## 2024-04-22 DIAGNOSIS — F411 Generalized anxiety disorder: Secondary | ICD-10-CM | POA: Diagnosis not present

## 2024-04-22 DIAGNOSIS — Z96641 Presence of right artificial hip joint: Secondary | ICD-10-CM | POA: Diagnosis not present

## 2024-04-22 DIAGNOSIS — M6281 Muscle weakness (generalized): Secondary | ICD-10-CM

## 2024-04-22 DIAGNOSIS — F4312 Post-traumatic stress disorder, chronic: Secondary | ICD-10-CM | POA: Diagnosis not present

## 2024-04-22 NOTE — Therapy (Signed)
 OUTPATIENT PHYSICAL THERAPY LOWER EXTREMITY TREATMENT  DISCHARGE/RENEWAL    Patient Name: Brianna [name] MRN: [id number] DOB:[birth year], 28 y.o., female Today's Date: 03/03/2024  END OF SESSION:  PT End of Session - 04/15/24 1616     Visit Number 20    Number of Visits 23    Date for Recertification  04/22/24   Authorization Type BC[employer]    PT Start Time 1614 late arrival    PT Stop Time 1645    PT Time Calculation (min) 31 min    Activity Tolerance Patient tolerated treatment well    Behavior During Therapy WFL for tasks assessed/performed          PHYSICAL THERAPY DISCHARGE SUMMARY  Visits from Start of Care: 20  Current functional level related to goals / functional outcomes: See below    Remaining deficits: See below    Education / Equipment: HEP, progressive overload, joint stability    Patient agrees to discharge. Patient goals were met. Patient is being discharged due to maximized rehab potential.    Past Medical History:  Diagnosis Date   Acute recurrent pansinusitis 10/21/2019   ADHD (attention deficit hyperactivity disorder)    inattentive   Allergy    Anemia    had iron infusions in Fall of 2023   Anxiety    Arthritis    Joints, hips   Asthma    EIA   Bronchitis    Cat allergy due to both airborne and skin contact 05/01/2016   Chronic sore throat 05/01/2016   COVID 2020   Depression    Ehlers-Danlos syndrome    Fibromyalgia    pt denies   Gastroparesis    GERD (gastroesophageal reflux disease)    History of hypotension    taken midodrine  in the past- Spring of 2022   Hypermobile Ehlers-Danlos syndrome    Irregular heart rate    Keratosis pilaris    Migraine    w and w/o auras   PCOS (polycystic ovarian syndrome) 10/26/2022   PONV (postoperative nausea and vomiting)    I can get really cold   PTSD (post-traumatic stress disorder)    Seasonal allergic rhinitis 03/11/2014   Past Surgical History:  Procedure Laterality Date    CHOLECYSTECTOMY  05/09/2021   ESOPHAGOGASTRODUODENOSCOPY ENDOSCOPY     03/2020   HIP ARTHROPLASTY Right    HIP ARTHROSCOPY Right 04/25/2022   Procedure: RIGHT HIP ARTHROSCOPY WITH LABRAL RECONSTRUCTION, ILIOTIBIAL BAND ALLOGRAFT, PSOAS RELEASE;  Surgeon: Genelle Standing, MD;  Location: [facility] OR;  Service: Orthopedics;  Laterality: Right;   HIP SURGERY Right    per pt   JOINT REPLACEMENT     Upcoming THR right hip 09/03/23   LABRAL REPAIR Right 03/02/2021   Procedure: RIGHT HIP ARTHROSCOPY WITH LABRAL REPAIR AND PINCE DEBRIDEMENT;  Surgeon: Genelle Standing, MD;  Location: [facility] OR;  Service: Orthopedics;  Laterality: Right;   SMART PILL PROCEDURE     12/2020   TYMPANOSTOMY TUBE PLACEMENT Bilateral    WISDOM TOOTH EXTRACTION     Patient Active Problem List   Diagnosis Date Noted   Chronic night sweats 08/21/2023   PCOS (polycystic ovarian syndrome) 10/26/2022   Cervical adenopathy 06/10/2021   Chronic pruritus 06/10/2021   Exercise-induced asthma 06/09/2021   Chronic venous insufficiency 06/03/2021   Orthostatic lightheadedness 06/03/2021   Purple toe syndrome of both feet (HCC) 06/03/2021   Autonomic dysfunction 06/03/2021   Tear of right acetabular labrum 04/13/2021   Migraine headache 03/01/2021   Headache 11/30/2020  Positive ANA (antinuclear antibody) 11/15/2020   Fibromyalgia 11/02/2020   Hair loss 11/02/2020   Muscle spasticity 10/22/2020   Hypermobile Ehlers-Danlos syndrome 10/22/2020   Paresthesia of bilateral legs 10/06/2020   Myalgia 10/06/2020   Chronic pelvic pain in female 10/06/2020   Chronic abdominal pain 10/06/2020   Irritable colon 08/02/2020   Pelvic floor dysfunction 08/02/2020   Irritant dermatitis 08/02/2020   Gastroesophageal reflux disease 06/28/2020   Severe recurrent major depression without psychotic features (HCC) 06/28/2020   Polyarthralgia 06/28/2020   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 06/28/2020   Tachycardia 06/28/2020    History of COVID-19 06/28/2020   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 deficit 06/28/2020   Recurrent major depressive disorder, in full remission 06/28/2020   Atypical chest pain 06/22/2020   Dizziness 06/22/2020   Palpitations 06/14/2020   Anxiety 05/04/2020   Internal and external hemorrhoids without complication 04/29/2020   Nondiabetic gastroparesis 03/03/2020   ETD (Eustachian tube dysfunction), bilateral 10/21/2019   Acne vulgaris 03/11/2014   Mild intermittent asthma without complication 03/11/2014   Seasonal allergies 03/11/2014    PCP: Wendolyn Jenkins Jansky MD  REFERRING PROVIDER: Ezzard Redell Ezzard MD  REFERRING DIAG: M16.31 (ICD-10-CM) - 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  She might be seeing an orthopedic for her forearm.  Has not heard about PT for her neck.  Agreeable to DC. Patient reports increased pain of her hip over the weekend.  Not sure why  Patient presents physical therapy evaluation status post right anterior hip replacement.  She has a history of 2 labral repairs in the past but after multiple surgical consultations it was found that her hip dysplasia was causing osteoarthritis and significant joint degeneration.  This is a sensitive topic for her because she has been dealing with this problem for several years and wonders how no one found hip dysplasia on her x-ray.  She opted for total hip replacement and overall is pleased with her progress.  She had home health physical therapy for 2  weeks following discharge from hospital.  Overall she has been keeping up with her exercises.  She walks with 1 crutch without incident but cannot limp the only thing I have difficulty with is putting on my socks.  She does have some issues with pain in her proximal anterior thigh and hip.  She has some numbness and tingling around the site.  She is planning to move back into her home this week which is on the first level.  She has not seen the surgeon yet but has an appointment in a few weeks.  She was unclear about any precautions other than what her home health therapist gave her.   PERTINENT HISTORY: Ehlers-Danlos syndrome, symptoms of dysautonomia including palpitations dizziness and heat intolerance  chronic joint and muscle pain  MD note:  Postoperative hip pain - Persistent left hip pain since total hip arthroplasty performed three months ago - Pain rated at 6 out of 10 in intensity - New onset of different pain and popping sensation in the hip while getting into her car - Pain was most intense  during the first three days after the incident and has since remained consistent - Pain flares with activities such as walking, driving, and physical therapy - Pain is aggravated by movements that activate the hip flexor - Unable to perform hip flexor activities without significant discomfort - Recent return to work has increased walking and subsequently worsened pain - Concern for developing gait abnormalities due to pain and hip weakness  Pain management and medication intolerance - Uses ice for pain management - Unable to tolerate NSAIDs due to history of adverse effects and gastroparesis - Not taking any medication for pain    PAIN:  Are you having pain? Yes: NPRS scale: 4/10  Pain location: Rt antlat. hip  Pain description: throbbing, achy  Aggravating factors: standing, walking  Relieving factors: Changing positions but something that helps the most is an ice pack which she uses  frequently  PRECAUTIONS: Anterior hip patient would be wise to follow due to collagen defect due to hypermobile Ehlers-Danlos syndrome  RED FLAGS: None   WEIGHT BEARING RESTRICTIONS: No  FALLS:  Has patient fallen in last 6 months? No  LIVING ENVIRONMENT: Lives with: lives alone Lives in: House/apartment Stairs: No Has following equipment at home: Crutches shower chair   OCCUPATION: [employer] out until 8/11 .   PLOF: Independent  PATIENT GOALS: I want to be walk pain free   NEXT MD VISIT:   OBJECTIVE:  Note: Objective measures were completed at Evaluation unless otherwise noted.  DIAGNOSTIC FINDINGS: see chart ([facility])   PATIENT SURVEYS:   COGNITION: Overall cognitive status: Within functional limits for tasks assessed     SENSATION: Patient reports numbness and tingling near incision site and along the lateral thigh  EDEMA:  Minimal surrounding incision Patient points out a apparent deformity on the right lateral hip just distal to the greater trochanter.  She reports that was present before her total hip replacement but is questioning what it is it did feel a bit puffy to me but it could also be gluteus atrophy which makes it look a little bit more pronounced  POSTURE: No Significant postural limitations  PALPATION: Tender to palpation along  well-healed incision  LOWER EXTREMITY MNF:[facility] hip ER/IR   Active ROM Right eval Left eval Rt 01/07/24 Rt. 02/22/24   Hip flexion P90 P 120 115 Pain 120 pinch    Hip extension       Hip abduction       Hip adduction       Hip internal rotation A24,P25 A35, P30 27 Pain  20   Hip external rotation A3, P10 A30, P50  47 Pain  45   Knee flexion       Knee extension       Ankle dorsiflexion       Ankle plantarflexion       Ankle inversion       Ankle eversion        (Blank rows = not tested)  LOWER EXTREMITY MMT: 02/22/24: LLE 5/5  MMT Right eval Left eval Rt  01/07/24 Rt. 02/22/24   Hip flexion 4 5 NT  (3-/5?)  3-/5    Hip extension   4/5 5/5   Hip abduction       Hip adduction   4/5    Hip internal rotation 4 4  4+   Hip external rotation 3+ 4  4   Knee flexion 4+ 5  5   Knee extension 5 5  5    Ankle dorsiflexion  5   Ankle plantarflexion       Ankle inversion       Ankle eversion        (Blank rows = not tested)    FUNCTIONAL TESTS:  5 times sit to stand: 16 seconds 30 seconds chair stand test, 9 repetitions  GAIT: Distance walked: 100 feet Assistive device utilized: Crutches x1 Level of assistance: Complete Independence Comments: Patient without appreciable limp for the first 75 feet, slower more cautious pace                                                                                                                                TREATMENT DATE:  [facility] Adult PT Treatment:                                                DATE: 04/22/24 Therapeutic Exercise: Recumbent bike Hip flexion/hamstring 1/2 kneeling stretch  Bridges 10 lbs x 10  Piriformis stretch figure 4 Sidelying leg series Dead lift 15 lbs Row 15 lbs single arm bench  x 10  Self Care: Tips for making time for workouts Post discharge fitness options  Home options , You tube channels (jennie Leona Mosses)  Full HEP review with structure and add ons Walking  and pushing intensity     [facility] Adult PT Treatment:                                                DATE: 04/15/24 Therapeutic Exercise/Activity: Sidelying CARS for hip mobility Clam  Hip abd  Red looped band x 10 flexion  March with bridge  Walking lunge (BW) Lateral walking 10 lbs goblet  Diagonal stepping  Multidirectional hopping Springboard jumping 1 red 1 blue double leg wide narrow Single leg 1 red 1 blue  Sidelying jumping 1 red 1 yellow     [facility] Adult PT Treatment:                                                DATE: 04/10/24 Therapeutic Exercise: Piriformis stretch- figure 4  Bridging  Single leg bridge Reverse step up Rt LE Hip  extension Rt  Hip abduction Rt  Supine SLR Sidelying SLR hip abd and side kicks   Manual Therapy: Prone hip PROM, IR and Er with compression to glutes, piriformis  Self Care: Offered tools for pain relief- MHP, massage tools, massage therapist, movement vs passive modalities during exercise and manual   [facility] Adult PT Treatment:  DATE: 03/25/24 Manual Therapy: IASTM Rt hip, glute/ITB Soft tissue mobilization TFL, glute med  Therapeutic Activity: SLR  SLR adductor  IR in sidelying ball between knees  Hip abduction  Sidekick flex/ext  Sideplank forearm with clam x 10  Reverse step down/hinge  BOSU forward lunge alt.  Added upper trunk rotation  Partial squat on BOSU (flat side up)  Head turns  Hip flexor stretch each side     PATIENT EDUCATION:  Education details: hip precautions, POC, HEP  Person educated: Patient Education method: Programmer, Multimedia, Demonstration, Verbal cues, and Handouts Education comprehension: verbalized understanding, returned demonstration, and needs further education  HOME EXERCISE PROGRAM: Access Code: [email or web address] URL: [email or web address] Date: 04/22/2024 Prepared by: Delon Norma  Exercises - Supine Bridge  - 1 x daily - 7 x weekly - 2 sets - 10 reps - 5 hold - Figure 4 Bridge  - 1 x daily - 7 x weekly - 2 sets - 10 reps - Standing Hip Abduction with Counter Support  - 1 x daily - 7 x weekly - 2 sets - 10 reps - 5 hold - Reverse Lunge  - 1 x daily - 7 x weekly - 2 sets - 10 reps - 5 hold - Seated Figure 4 Piriformis Stretch  - 1 x daily - 7 x weekly - 1 sets - 3 reps - 30 hold - Side Stepping with Resistance at Thighs  - 1 x daily - 7 x weekly - 2 sets - 10 reps - Marching with Resistance  - 1 x daily - 7 x weekly - 2 sets - 10 reps - 5 hold - Clamshell with Resistance  - 1 x daily - 7 x weekly - 2 sets - 10 reps - Sidelying Hip Abduction  - 1 x daily - 7 x weekly - 2 sets - 10 reps -  Sidelying Hip Circles  - 1 x daily - 7 x weekly - 2 sets - 10 reps  ASSESSMENT:  CLINICAL IMPRESSION: This is the patient's last scheduled appointment today.  She continues to have minimal to moderate pain in her hip.  She reports doing her exercises mostly after work.  We discussed many options for her to continue to be working on strengthening and mobility and function.  At this point she does not have the finances to pay for any sort of gym membership or fitness class.  She does have dumbbells and so we worked on the thing of her home exercise program.  I recommend she keep some sort of tracker to help with symptoms and structure per week, but this is difficult with her ADHD.  Overall Reality is much improved over the past 5 +months.  She has met her goals but still does have pain which limits her.  I encouraged her to listen to her body but understand that chronic pain is different than acute pain and should be treated differently.  Her pain is multifactorial and at this time skilled therapy has run its course.  She understands that she can return with a new referral if needed after several months off from physical therapy.     OBJECTIVE IMPAIRMENTS: decreased activity tolerance, decreased endurance, decreased knowledge of use of DME, decreased mobility, difficulty walking, decreased ROM, decreased strength, increased edema, increased fascial restrictions, increased muscle spasms, impaired sensation, and pain.   ACTIVITY LIMITATIONS: carrying, lifting, bending, sitting, standing, squatting, sleeping, transfers, bed mobility, dressing, and locomotion level  PARTICIPATION LIMITATIONS: cleaning, driving, shopping, community activity, and occupation  PERSONAL FACTORS: Past/current experiences, Transportation, and 1-2 comorbidities: EDS< previous hip surgeries are also affecting patient's functional outcome.   REHAB POTENTIAL: Excellent  CLINICAL DECISION MAKING: Evolving/moderate  complexity  EVALUATION COMPLEXITY: Moderate   GOALS: Goals reviewed with patient? Yes  SHORT TERM GOALS: Target date: 12/18/2023   Patient will be able to show independence for initial HEP to include posture, core and hip strength and stability.   Baseline: Goal status: MET   2.  Patient will be I with concepts of joint protection, hip precautions and stability as it pertains to joint hypermobility. Baseline:  Goal status:MET   3.  Patient will be able to ambulate in the clinic without the use of crutch and no discernible limp 300 feet  Baseline:  Goal status: MET   LONG TERM GOALS: Target date: 01/15/2024    LEFS score will increase by 16 points indicating improved functional mobility Baseline: 19/80, 02/22/24: 30/80 Goal status: MET  2.  Patient will be independent with home exercise program upon discharge from physical therapy Baseline:  Goal status:MET   3.  Patient will be able to walk without assistive device and pain no more than 2/10 In the right hip community distances > 1000 feet  Baseline:  Goal status: ongoing, pain > 2/10 but can do this distance  Partially met  4.  Patient will demonstrate a pain free squat in order to complete transfers repetitively and obtain items below the waist for work and home tasks Baseline:  Goal status: ongoing , can do a loaded box squat but has pinching with full deep squat to the floor Partially met   5.  Patient will be able to demonstrate hip/knee/ankle strength to 5/5 in order to maximize functional mobility, ambulation and lifting.   Baseline:  Goal status: hip abd 4+/5 partially met    6. Patient will be able to show dynamic balance recovery in single leg exercises    Baseline:   Goal status: MET  PLAN:  PT FREQUENCY: 2x/week  PT DURATION: 8 weeks  PLANNED INTERVENTIONS: 97164- PT Re-evaluation, 97750- Physical Performance Testing, 97110-Therapeutic exercises, 97530- Therapeutic activity, 97112- Neuromuscular  re-education, 97535- Self Care, 02859- Manual therapy, [phone]- Gait training, [phone]- Aquatic Therapy, [phone]- Electrical stimulation (unattended), Patient/Family education, Balance training, Stair training, Taping, DME instructions, Cryotherapy, and Moist heat  PLAN FOR NEXT SESSION:NA, DC   Delon Norma, PT 04/22/24 4:46 PM Phone: [phone] Fax: [phone]

## 2024-04-24 ENCOUNTER — Encounter: Admitting: Physical Therapy

## 2024-04-26 DIAGNOSIS — F332 Major depressive disorder, recurrent severe without psychotic features: Secondary | ICD-10-CM | POA: Diagnosis not present

## 2024-04-28 ENCOUNTER — Ambulatory Visit: Admitting: Obstetrics & Gynecology

## 2024-04-28 ENCOUNTER — Encounter: Payer: Self-pay | Admitting: Obstetrics & Gynecology

## 2024-04-28 VITALS — BP 104/69 | HR 92 | Ht 64.0 in | Wt 137.0 lb

## 2024-04-28 DIAGNOSIS — Z30431 Encounter for routine checking of intrauterine contraceptive device: Secondary | ICD-10-CM | POA: Diagnosis not present

## 2024-04-28 DIAGNOSIS — N926 Irregular menstruation, unspecified: Secondary | ICD-10-CM | POA: Diagnosis not present

## 2024-04-28 DIAGNOSIS — E282 Polycystic ovarian syndrome: Secondary | ICD-10-CM | POA: Diagnosis not present

## 2024-04-28 DIAGNOSIS — F411 Generalized anxiety disorder: Secondary | ICD-10-CM | POA: Diagnosis not present

## 2024-04-28 DIAGNOSIS — F332 Major depressive disorder, recurrent severe without psychotic features: Secondary | ICD-10-CM | POA: Diagnosis not present

## 2024-04-28 DIAGNOSIS — R102 Pelvic and perineal pain unspecified side: Secondary | ICD-10-CM | POA: Diagnosis not present

## 2024-04-28 DIAGNOSIS — F4312 Post-traumatic stress disorder, chronic: Secondary | ICD-10-CM | POA: Diagnosis not present

## 2024-04-28 NOTE — Progress Notes (Unsigned)
   Subjective:    [name], female    DOB: [birth year], 28 y.o.   MRN: [id number]  HPI  28 yo female presents for pelvic pain and concers about her menstrual cycle.  Mirena  placed last year.  Menses are now dark brown and scant.  Sometimes irregular as she has diagnosis of PCOS  She is on day 4 of her cycle currently and wonders if her night sweats are due to her hormones.  We will drawa an [facility] and estradiol  for completeness and pt reassured that her symptoms are not due to premature menopause. Pt tearful today--she and her significant other broke up yesterday.  Her mother is having a gastric bypass surgery in January and Peter will be taking care of her.  They have a tense relationship that worsens with more time spent to gther.  Pt was to have surgery with [facility] for diagnosis and treatment of endometriosis.  She has not had surgery yet due to other health problems.  I encouraged her to have the surgery before trying other medications--she is very sensitive to the side effects.  We aligned that she will go back to [facility] for a presurgical visit and aim for surgery in the spring.    Review of Systems  Gastrointestinal:  Positive for abdominal pain and diarrhea.  Genitourinary:  Positive for menstrual problem and pelvic pain.  Musculoskeletal:  Positive for arthralgias and myalgias.  Neurological:  Positive for headaches.  Psychiatric/Behavioral:  Positive for dysphoric mood.        Objective:   Physical Exam Vitals reviewed.  Constitutional:      General: She is not in acute distress.    Appearance: She is well-developed.  HENT:     Head: Normocephalic and atraumatic.  Eyes:     Conjunctiva/sclera: Conjunctivae normal.  Cardiovascular:     Rate and Rhythm: Normal rate.  Pulmonary:     Effort: Pulmonary effort is normal.  Skin:    General: Skin is warm and dry.  Neurological:     Mental Status: She is alert and oriented to person,  place, and time.  Psychiatric:        Mood and Affect: Mood normal.    Vitals:   04/28/24 1458  BP: 104/69  Pulse: 92  Weight: 137 lb (62.1 kg)  Height: 5' 4 (1.626 m)      Assessment & Plan:  28 yo female with PCOS, pelvic pain due to presumed endometriosis, night sweats   Night sweats--check FSH and estradiol .  Premature menopause is highly unlikely and we are using joining decision making as we explore etiologies.  Pt aligns to scheduling with [facility] surgeon at [facility] to schedule surgery.

## 2024-04-29 ENCOUNTER — Ambulatory Visit: Payer: Self-pay | Admitting: Obstetrics & Gynecology

## 2024-04-29 LAB — FOLLICLE STIMULATING HORMONE: FSH: 8.6 m[IU]/mL

## 2024-04-29 LAB — ESTRADIOL: Estradiol: 72.6 pg/mL

## 2024-05-03 DIAGNOSIS — F332 Major depressive disorder, recurrent severe without psychotic features: Secondary | ICD-10-CM | POA: Diagnosis not present

## 2024-05-05 DIAGNOSIS — F332 Major depressive disorder, recurrent severe without psychotic features: Secondary | ICD-10-CM | POA: Diagnosis not present

## 2024-05-05 DIAGNOSIS — G5691 Unspecified mononeuropathy of right upper limb: Secondary | ICD-10-CM | POA: Diagnosis not present

## 2024-05-06 ENCOUNTER — Encounter: Payer: Self-pay | Admitting: Obstetrics & Gynecology

## 2024-05-06 DIAGNOSIS — F411 Generalized anxiety disorder: Secondary | ICD-10-CM | POA: Diagnosis not present

## 2024-05-06 DIAGNOSIS — F332 Major depressive disorder, recurrent severe without psychotic features: Secondary | ICD-10-CM | POA: Diagnosis not present

## 2024-05-06 DIAGNOSIS — F4312 Post-traumatic stress disorder, chronic: Secondary | ICD-10-CM | POA: Diagnosis not present

## 2024-05-07 ENCOUNTER — Other Ambulatory Visit: Payer: Self-pay | Admitting: Obstetrics & Gynecology

## 2024-05-07 DIAGNOSIS — T839XXA Unspecified complication of genitourinary prosthetic device, implant and graft, initial encounter: Secondary | ICD-10-CM

## 2024-05-07 DIAGNOSIS — G8929 Other chronic pain: Secondary | ICD-10-CM

## 2024-05-07 DIAGNOSIS — N926 Irregular menstruation, unspecified: Secondary | ICD-10-CM

## 2024-05-07 NOTE — Progress Notes (Signed)
 US  for bleeding and pain and check IUD placement.

## 2024-05-08 ENCOUNTER — Other Ambulatory Visit: Payer: Self-pay | Admitting: Obstetrics & Gynecology

## 2024-05-08 MED ORDER — NORETHINDRONE ACETATE 5 MG PO TABS: 10.0000 mg | ORAL_TABLET | Freq: Every day | ORAL | 0 refills | Status: AC

## 2024-05-08 NOTE — Progress Notes (Signed)
 Brianna [name] is having bleeding and pain and requests norethindrone .  Pelvic US  is pending.  Last US  in June shows no abnormality and IUD in correct position.

## 2024-05-09 DIAGNOSIS — G43719 Chronic migraine without aura, intractable, without status migrainosus: Secondary | ICD-10-CM | POA: Diagnosis not present

## 2024-05-09 NOTE — Progress Notes (Signed)
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  Perindopril (Aceon)  []  Quinapril (Accupril)  []  Ramipril (Altace)  []  Trandolapril (Mavik)  Angiotensin II Receptor Blockers:  []  Azilsartan (Edarbi)  []  Candesartan (Atacand)  []  Eprosartan  []  Irbesartan (Avapro)  []  Losartan (Cozaar)  []  Olmesartan (Benicar)  []  Telmisartan (Misardis)  []  Valsartan (Diovan)  Beta Blockers  []  Acebutolol (Sectral)  []  Atenolol (Tenormin)  []  Bisoprolol (Zebeta)  []  Carvedilol (Coreg)  []  Metoprolol (Lopressor)  []  Nadolol (Cogard)  []  Nebivolol (Bystolic)  [x]  Propranolol (Inderal)                                  08/2021 -  09/2021   []  Timolol    Calcium Channel Blockers:   []  Amlodipine  (Norvasc)  []  Bepridil (Vascor)  []  Diltiazem (Cardiazem)  []  Felodipine (Plendil)  []  Nicardipine (Cardene)   []  Nifedipine (Procardia)  []  Nisoldipine (Sular)   []  Verapamil   Alpha-1 Blockers  []  Doxazosin (Cardura)  []  Clonidine (Catapres)    []   Prazosin  []  Tetrazosin  Diuretics:   []  Furosemide (Lasix)  []  Hydrochlorothiazide (Microzide)  []  Spironolactone (Aldactone)    Monoclonal Antibodies:   []  Erenumab (Aimovig)  []  Fgremanezumab (Ajovy)  [x]  Galcanezumab (Emgality)      no change in headache   6 months 2023  []  Eptinezumab (Vyepti)    Gepants - for prevention  []  Nurtec (Remigepant)   []  Atogepant (Quilipta)    Other Headache Management:     []  Doxycycline   []  Memantine (Namenda)  [x]  OnabotulinumtoxinA  (Botox )  X 1 injection with no change in headache     Ergotamines:   []  Dihydroergotamine nasal  spray (Migranal)    []  Dihydroergotamine solution for injection (DHE-45)   []  Dihydroergotamine nasal powder (Trudhesa)     []  Ergotamine/caffeine tab (Cafergot)  []  Ergotamine/caffeine suppository (Migergot)   []  Methergine   []  Methylsergide (Sansert)    Triptans:   []  Sumatriptan (Imitrex) PO   []  Sumatriptan (Imitrex) NS  []  Sumatriptan (Imitrex) SQ injection   []  Sumatriptan (Onzetra) Nasal powder  []  Sumatriptan/Naproxen (treximet)   []  Eletriptan (Relpax) - did take the medication -      []  Zolmitriptan (Zomig) nasal spray  []  Zolmitriptan (Zomig) PO     [x]  Rizatriptan  (Maxalt )            No change in headache      []  Almotriptan (Axert)     [x]  Naratriptan (Amerge)   No change in headache      []  Frovatriptan (Frova)     Ditan:   []  Lasmiditan (Reyvow)    2nd Generation gPANTS:  []  Ubrelvy (Ubrogepant)  []  Nurtec (Remigepant)      Supplements:        []  Butterbur     []  Coenzyme Q10   []  Feverfew    [x]  Magnesium  []  Migrelief (combination Feverfew/Magnesium/Riboflavin)  [x]  Melatonin  []  Vit. B2 (riboflavin)    []  Tumeric                                                                  NSAIDS:     [x]  Aspirin   []  Celecoxib (Celebrex)  []  Diclofenac potassium  []  Diclofenac sodium   [x]   Ibuprofen  (Advil )     SE GI upset  and constant pain   []  Indomethacin  []  Ketoprofen   []  Ketorolac  PO (Toradol )  []  Ketorolac  IM (Toradol )   []  Ketorolac  NS (Sprix )  [x]  Meloxicam  (Mobic )    SE GI upset and  constant pain   []  Nabumetone (relafen)     []  Naproxen sodium (Aleve)                                 [x]  Acetaminophen  (tylenol )    SE GI upset  and constant pain     Combination Analgesics:   []  Acetaminophen /aspirin /caffeine (Excedrin/Pamprin/Goodys  Powder)  []  Acetaminophen /caffeine/pyrilamine maleate (Midol )  []  Acetaminophen /dichloralphenazone/isometheptene  (Midrin)  []  Asprin and Caffeine (BC Powder)  []   Butalbital/aspirin /caffeine/codeine  (Fiorinal with codeine )  []   Butalbital/Aspirin /Caffeine (Fiorinal)  []   Butalbital/acetaminophen /caffeine (Fioricet)     Anti-Histamines:   [x]  Cetirizine (Zyrtec)   []   Cyproheptadine (Periactin)    [x]  Diphenhydramine  (  Benadryl )   []  Fexofenadine (allegra)  []  Hydroxyzine (vistaril/atarax)   [x]  Loratadine  (Claritin )  []  Montelukast (Singulair)  [x]  Pseudoephedrine (Sudafed)    Anti-emetics:   []  Aprepitant  (Emend )  []   Granisetron   [x]  Metoclopramide (Reglan)   [x]  Ondansetron  (Zofran )  []  Meclizine (Bonine)  [x]  Prochlorperazine (compazine)  [x]  Promethazine  (Phenergan )    []   Chlorpromazine (thorazine)    Muscle relaxers:   []  Baclofen (lioresal)  []  Carisoprodol (Soma)  [x]  Cyclobenzaprine  (flexeril )   SE constant  pain and insomnia   []  Metaxalone (skelaxin)  []  Methocarbamol  (robaxin )  []  Orphenadrine (Norflex)  []  Tizanidine (zanaflex)    Steroids:  []  Dexamethasone  (decadron )  [x]  Prednisone   []  Methylprednisolone      Sleep Aids:   []  Eszopiclone (Lunesta)  []  Ramelteon (Rozerem  []  Zaleplon (Sonata)  []  Zolpidem (Ambien)  [x]  Nyquil  [x]  Tylenol  PM     Benzodiazepines:      []  Alprazolam (Xanax)    []  Chlordiazepoxide (Librium)   [x]  Clonazepam (Klonopin)  []  Diazepam (Valium)  []    Lorazepam (Ativan)    []   Temazepam (Restoril)    Opioids/Narcotics/Controlled Substances:   []  Acetaminophen /Hydrocodone (Norco/Vicodin)  []  Butorphanol (Ketamine/Stadol)   [x]  Fentanyl          []  Hydrocodone   []  Hydromorphone  (Dilaudid )  []  Marijuana  []  Morphine (MS Contin)  []  Oxycodone   []  Oxycodone /Acetaminophen  (Percocet )  [x]  Tramadol  (Ultram )    Procedures:                                                                     []  Auriculotemporal blocks         []  Lumbar puncture   []  Occipital nerve blocks  []  Sphenopalatine ganglion blocks  []  Supraorbital blocks                                []  Trigger point injections    Neuromodulation:   []  Cefaly   []  nVNS/Gammacore   [x]  Nerivio Migra               no change in headache   []  Spring TMS   []  Relivion     Non-pharmacologic Tx   []   Acupuncture   []  Acupressure    []  Biofeedback   []  Craniosacral therapy   []   Chiropractic Care   []  Cognitive Behavioral Therapy  []   Daith piercing   []   Dry needling    []  Massage therapy   [x]  Physical therapy  [x]  Psychotherapy   [x]  TENS unit   [x]  Yoga  []  Reiki  []  Tai Chi

## 2024-05-12 ENCOUNTER — Ambulatory Visit

## 2024-05-12 DIAGNOSIS — N926 Irregular menstruation, unspecified: Secondary | ICD-10-CM

## 2024-05-12 DIAGNOSIS — T839XXA Unspecified complication of genitourinary prosthetic device, implant and graft, initial encounter: Secondary | ICD-10-CM

## 2024-05-12 DIAGNOSIS — N939 Abnormal uterine and vaginal bleeding, unspecified: Secondary | ICD-10-CM | POA: Diagnosis not present

## 2024-05-12 DIAGNOSIS — Z975 Presence of (intrauterine) contraceptive device: Secondary | ICD-10-CM | POA: Diagnosis not present

## 2024-05-12 DIAGNOSIS — R102 Pelvic and perineal pain unspecified side: Secondary | ICD-10-CM | POA: Diagnosis not present

## 2024-05-13 ENCOUNTER — Other Ambulatory Visit

## 2024-05-16 ENCOUNTER — Other Ambulatory Visit

## 2024-05-19 DIAGNOSIS — F332 Major depressive disorder, recurrent severe without psychotic features: Secondary | ICD-10-CM | POA: Diagnosis not present

## 2024-05-25 ENCOUNTER — Ambulatory Visit: Payer: Self-pay | Admitting: Obstetrics & Gynecology

## 2024-06-17 ENCOUNTER — Encounter: Payer: Self-pay | Admitting: Family Medicine

## 2025-04-03 ENCOUNTER — Encounter: Admitting: Family Medicine

## 2025-04-13 ENCOUNTER — Encounter: Admitting: Family Medicine
